# Patient Record
Sex: Female | Born: 1975
Health system: Southern US, Community
[De-identification: ages and names within clinical notes are randomized; demographics above are authoritative.]

## PROBLEM LIST (undated history)

## (undated) DIAGNOSIS — F5000 Anorexia nervosa, unspecified: Secondary | ICD-10-CM

## (undated) DIAGNOSIS — G629 Polyneuropathy, unspecified: Secondary | ICD-10-CM

## (undated) DIAGNOSIS — R3916 Straining to void: Secondary | ICD-10-CM

## (undated) DIAGNOSIS — K219 Gastro-esophageal reflux disease without esophagitis: Secondary | ICD-10-CM

## (undated) DIAGNOSIS — Z87898 Personal history of other specified conditions: Secondary | ICD-10-CM

## (undated) DIAGNOSIS — A0472 Enterocolitis due to Clostridium difficile, not specified as recurrent: Secondary | ICD-10-CM

## (undated) DIAGNOSIS — G8929 Other chronic pain: Secondary | ICD-10-CM

## (undated) DIAGNOSIS — F32A Depression, unspecified: Secondary | ICD-10-CM

## (undated) DIAGNOSIS — R600 Localized edema: Secondary | ICD-10-CM

## (undated) DIAGNOSIS — Z8489 Family history of other specified conditions: Secondary | ICD-10-CM

## (undated) DIAGNOSIS — Z853 Personal history of malignant neoplasm of breast: Secondary | ICD-10-CM

## (undated) DIAGNOSIS — M1990 Unspecified osteoarthritis, unspecified site: Secondary | ICD-10-CM

## (undated) DIAGNOSIS — R351 Nocturia: Secondary | ICD-10-CM

## (undated) DIAGNOSIS — F419 Anxiety disorder, unspecified: Secondary | ICD-10-CM

## (undated) DIAGNOSIS — F3160 Bipolar disorder, current episode mixed, unspecified: Secondary | ICD-10-CM

## (undated) DIAGNOSIS — F1010 Alcohol abuse, uncomplicated: Secondary | ICD-10-CM

## (undated) DIAGNOSIS — Z9221 Personal history of antineoplastic chemotherapy: Secondary | ICD-10-CM

## (undated) DIAGNOSIS — R569 Unspecified convulsions: Secondary | ICD-10-CM

## (undated) DIAGNOSIS — R232 Flushing: Secondary | ICD-10-CM

## (undated) DIAGNOSIS — T451X5A Adverse effect of antineoplastic and immunosuppressive drugs, initial encounter: Secondary | ICD-10-CM

## (undated) DIAGNOSIS — C50919 Malignant neoplasm of unspecified site of unspecified female breast: Secondary | ICD-10-CM

## (undated) DIAGNOSIS — R1031 Right lower quadrant pain: Secondary | ICD-10-CM

## (undated) DIAGNOSIS — I427 Cardiomyopathy due to drug and external agent: Secondary | ICD-10-CM

## (undated) DIAGNOSIS — C801 Malignant (primary) neoplasm, unspecified: Secondary | ICD-10-CM

## (undated) DIAGNOSIS — R350 Frequency of micturition: Secondary | ICD-10-CM

## (undated) DIAGNOSIS — I82629 Acute embolism and thrombosis of deep veins of unspecified upper extremity: Secondary | ICD-10-CM

## (undated) DIAGNOSIS — M87059 Idiopathic aseptic necrosis of unspecified femur: Secondary | ICD-10-CM

## (undated) DIAGNOSIS — R3915 Urgency of urination: Secondary | ICD-10-CM

## (undated) DIAGNOSIS — J189 Pneumonia, unspecified organism: Secondary | ICD-10-CM

## (undated) DIAGNOSIS — Z8614 Personal history of Methicillin resistant Staphylococcus aureus infection: Secondary | ICD-10-CM

## (undated) DIAGNOSIS — N83202 Unspecified ovarian cyst, left side: Secondary | ICD-10-CM

## (undated) DIAGNOSIS — G43909 Migraine, unspecified, not intractable, without status migrainosus: Secondary | ICD-10-CM

## (undated) DIAGNOSIS — F329 Major depressive disorder, single episode, unspecified: Secondary | ICD-10-CM

## (undated) HISTORY — DX: Unspecified convulsions: R56.9

## (undated) HISTORY — DX: Enterocolitis due to Clostridium difficile, not specified as recurrent: A04.72

## (undated) HISTORY — DX: Malignant neoplasm of unspecified site of unspecified female breast: C50.919

## (undated) HISTORY — DX: Anxiety disorder, unspecified: F41.9

## (undated) HISTORY — PX: PORT-A-CATH REMOVAL: SHX5289

## (undated) HISTORY — PX: WISDOM TOOTH EXTRACTION: SHX21

## (undated) HISTORY — DX: Personal history of antineoplastic chemotherapy: Z92.21

## (undated) HISTORY — DX: Major depressive disorder, single episode, unspecified: F32.9

## (undated) HISTORY — DX: Depression, unspecified: F32.A

## (undated) HISTORY — PX: OTHER SURGICAL HISTORY: SHX169

---
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
## 2001-04-20 ENCOUNTER — Other Ambulatory Visit: Admission: RE | Admit: 2001-04-20 | Discharge: 2001-04-20 | Payer: Self-pay | Admitting: Family Medicine

## 2002-03-08 ENCOUNTER — Encounter: Admission: RE | Admit: 2002-03-08 | Discharge: 2002-03-08 | Payer: Self-pay

## 2002-05-30 ENCOUNTER — Other Ambulatory Visit: Admission: RE | Admit: 2002-05-30 | Discharge: 2002-05-30 | Payer: Self-pay | Admitting: Family Medicine

## 2003-02-15 ENCOUNTER — Emergency Department (HOSPITAL_COMMUNITY): Admission: AD | Admit: 2003-02-15 | Discharge: 2003-02-15 | Payer: Self-pay | Admitting: Family Medicine

## 2003-10-07 ENCOUNTER — Emergency Department (HOSPITAL_COMMUNITY): Admission: EM | Admit: 2003-10-07 | Discharge: 2003-10-07 | Payer: Self-pay | Admitting: Emergency Medicine

## 2006-12-19 ENCOUNTER — Emergency Department (HOSPITAL_COMMUNITY): Admission: EM | Admit: 2006-12-19 | Discharge: 2006-12-19 | Payer: Self-pay | Admitting: Emergency Medicine

## 2006-12-28 ENCOUNTER — Encounter: Admission: RE | Admit: 2006-12-28 | Discharge: 2006-12-28 | Payer: Self-pay | Admitting: *Deleted

## 2006-12-29 ENCOUNTER — Emergency Department (HOSPITAL_COMMUNITY): Admission: EM | Admit: 2006-12-29 | Discharge: 2006-12-30 | Payer: Self-pay | Admitting: Emergency Medicine

## 2007-03-01 ENCOUNTER — Emergency Department (HOSPITAL_COMMUNITY): Admission: EM | Admit: 2007-03-01 | Discharge: 2007-03-01 | Payer: Self-pay | Admitting: Emergency Medicine

## 2007-04-23 ENCOUNTER — Ambulatory Visit: Payer: Self-pay | Admitting: Family Medicine

## 2007-04-23 ENCOUNTER — Observation Stay (HOSPITAL_COMMUNITY): Admission: EM | Admit: 2007-04-23 | Discharge: 2007-04-24 | Payer: Self-pay | Admitting: Family Medicine

## 2007-05-02 ENCOUNTER — Ambulatory Visit: Payer: Self-pay | Admitting: Family Medicine

## 2007-05-02 ENCOUNTER — Observation Stay (HOSPITAL_COMMUNITY): Admission: AD | Admit: 2007-05-02 | Discharge: 2007-05-03 | Payer: Self-pay | Admitting: Family Medicine

## 2007-05-07 ENCOUNTER — Emergency Department (HOSPITAL_COMMUNITY): Admission: EM | Admit: 2007-05-07 | Discharge: 2007-05-08 | Payer: Self-pay | Admitting: Emergency Medicine

## 2007-05-16 ENCOUNTER — Ambulatory Visit: Payer: Self-pay | Admitting: Internal Medicine

## 2007-05-17 ENCOUNTER — Ambulatory Visit: Payer: Self-pay | Admitting: Internal Medicine

## 2007-11-24 ENCOUNTER — Emergency Department (HOSPITAL_COMMUNITY): Admission: EM | Admit: 2007-11-24 | Discharge: 2007-11-24 | Payer: Self-pay | Admitting: Emergency Medicine

## 2008-01-08 ENCOUNTER — Emergency Department (HOSPITAL_COMMUNITY): Admission: EM | Admit: 2008-01-08 | Discharge: 2008-01-08 | Payer: Self-pay | Admitting: Emergency Medicine

## 2008-04-09 ENCOUNTER — Emergency Department (HOSPITAL_COMMUNITY): Admission: EM | Admit: 2008-04-09 | Discharge: 2008-04-09 | Payer: Self-pay | Admitting: Emergency Medicine

## 2008-07-12 ENCOUNTER — Emergency Department (HOSPITAL_COMMUNITY): Admission: EM | Admit: 2008-07-12 | Discharge: 2008-07-13 | Payer: Self-pay | Admitting: *Deleted

## 2008-12-02 ENCOUNTER — Emergency Department (HOSPITAL_COMMUNITY): Admission: EM | Admit: 2008-12-02 | Discharge: 2008-12-02 | Payer: Self-pay | Admitting: Emergency Medicine

## 2008-12-13 ENCOUNTER — Emergency Department (HOSPITAL_COMMUNITY): Admission: EM | Admit: 2008-12-13 | Discharge: 2008-12-14 | Payer: Self-pay | Admitting: Emergency Medicine

## 2009-01-19 ENCOUNTER — Emergency Department (HOSPITAL_COMMUNITY): Admission: EM | Admit: 2009-01-19 | Discharge: 2009-01-20 | Payer: Self-pay | Admitting: Emergency Medicine

## 2009-01-28 ENCOUNTER — Ambulatory Visit: Payer: Self-pay | Admitting: Gastroenterology

## 2009-01-28 DIAGNOSIS — R1031 Right lower quadrant pain: Secondary | ICD-10-CM

## 2009-01-28 DIAGNOSIS — R112 Nausea with vomiting, unspecified: Secondary | ICD-10-CM

## 2009-01-29 ENCOUNTER — Ambulatory Visit: Payer: Self-pay | Admitting: Gastroenterology

## 2009-02-05 ENCOUNTER — Ambulatory Visit (HOSPITAL_COMMUNITY): Admission: RE | Admit: 2009-02-05 | Discharge: 2009-02-05 | Payer: Self-pay | Admitting: Gastroenterology

## 2009-02-25 ENCOUNTER — Ambulatory Visit: Payer: Self-pay | Admitting: Obstetrics and Gynecology

## 2009-03-01 ENCOUNTER — Emergency Department (HOSPITAL_COMMUNITY): Admission: EM | Admit: 2009-03-01 | Discharge: 2009-03-01 | Payer: Self-pay | Admitting: Emergency Medicine

## 2009-04-23 ENCOUNTER — Ambulatory Visit: Payer: Self-pay | Admitting: Obstetrics and Gynecology

## 2009-06-08 ENCOUNTER — Emergency Department (HOSPITAL_COMMUNITY): Admission: EM | Admit: 2009-06-08 | Discharge: 2009-06-08 | Payer: Self-pay | Admitting: Emergency Medicine

## 2009-07-20 ENCOUNTER — Ambulatory Visit: Payer: Self-pay | Admitting: Psychiatry

## 2009-07-20 ENCOUNTER — Inpatient Hospital Stay (HOSPITAL_COMMUNITY): Admission: RE | Admit: 2009-07-20 | Discharge: 2009-07-29 | Payer: Self-pay | Admitting: Psychiatry

## 2009-08-21 ENCOUNTER — Emergency Department (HOSPITAL_COMMUNITY): Admission: EM | Admit: 2009-08-21 | Discharge: 2009-08-21 | Payer: Self-pay | Admitting: Emergency Medicine

## 2009-12-05 IMAGING — CT CT PELVIS W/ CM
2 of 4 series · 17 of 46 positions shown, 19 images · IV contrast (APPLIED)
Comparison: 04/09/2008

CT ABDOMEN

CLINICAL DATA: Right-sided abdominal and pelvic pain.  Vomiting.

CT ABDOMEN AND PELVIS WITH CONTRAST
TECHNIQUE: Multidetector CT imaging of the abdomen and pelvis was
performed using the standard protocol following bolus
administration of intravenous contrast.
Contrast: 100 ml Zmnipaque-TAA and oral contrast

[Series 2: abd_pel 5.0 b40f st · axial · 0.73mm/px · z∈[-438,+2]mm · 14 of 96 slices shown, 16 images]
[im 4/96  soft-tissue]
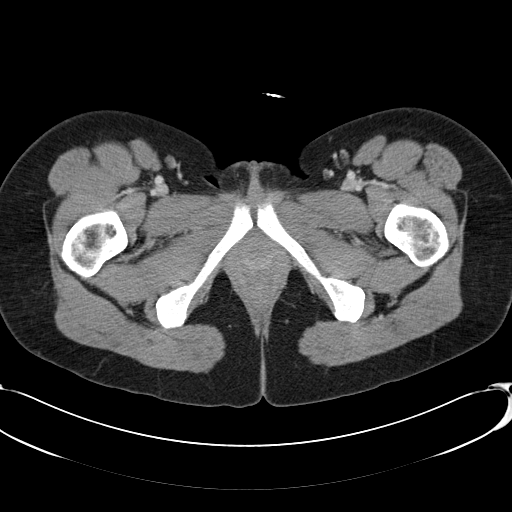
[im 4/96  bone]
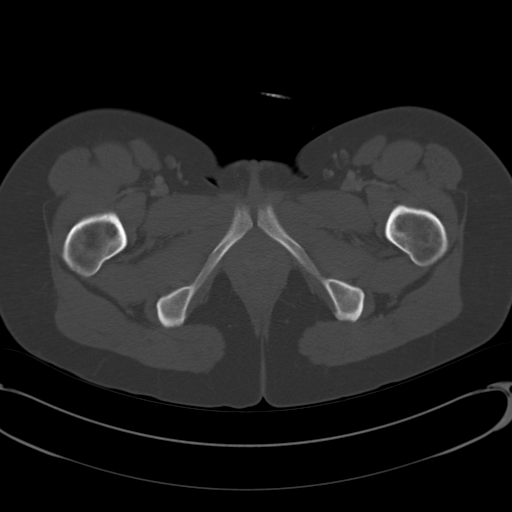
[im 12/96  soft-tissue]
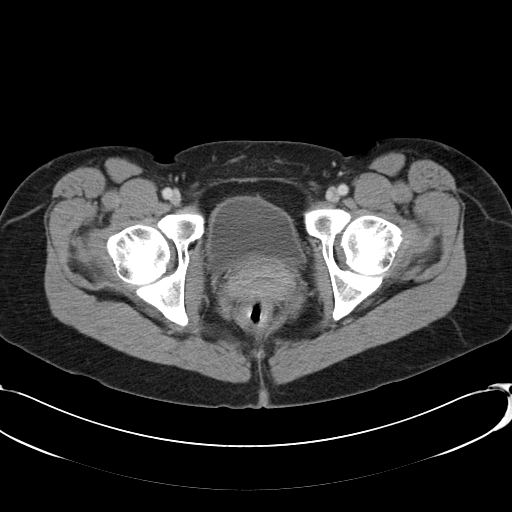
[im 20/96  soft-tissue]
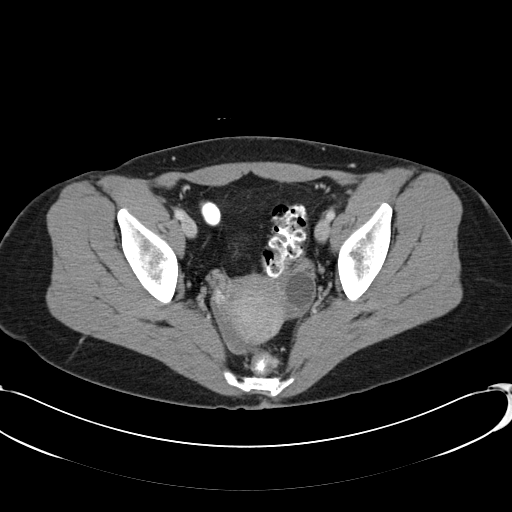
[im 27/96  soft-tissue]
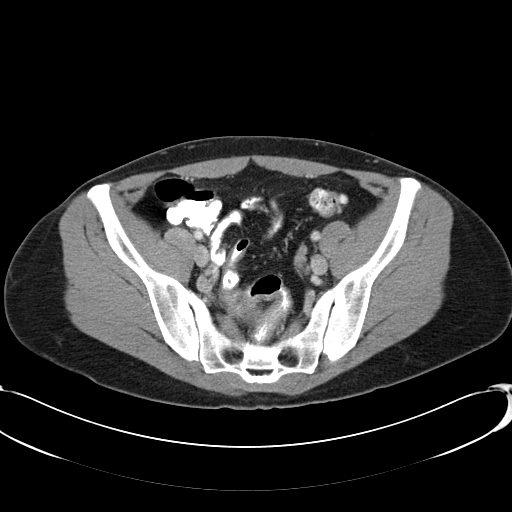
[im 31/96  soft-tissue]
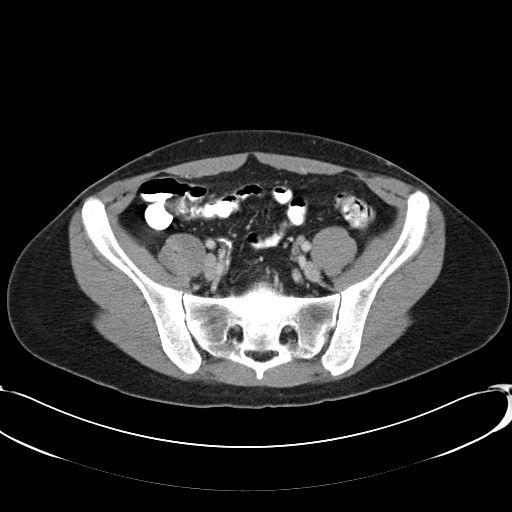
[im 39/96  soft-tissue]
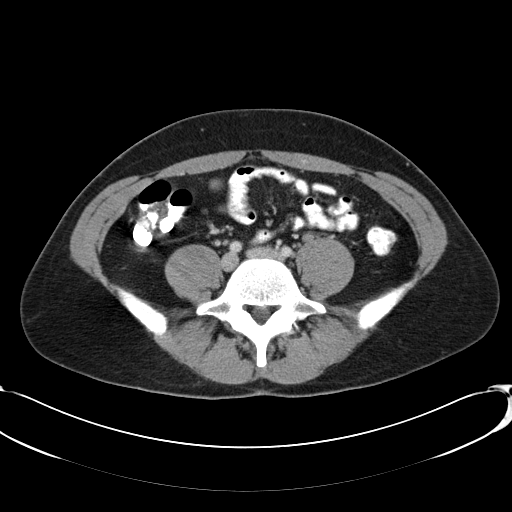
[im 46/96  soft-tissue]
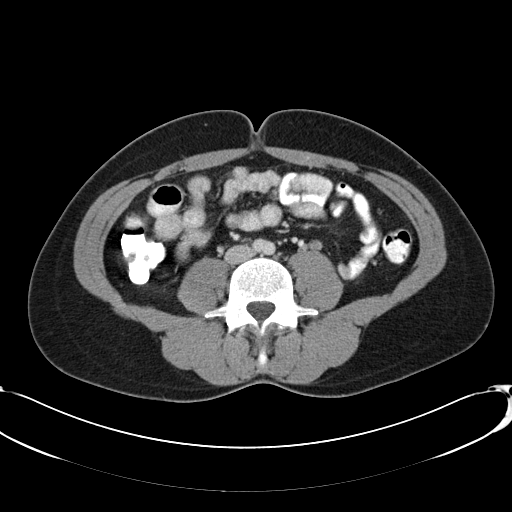
[im 50/96  soft-tissue]
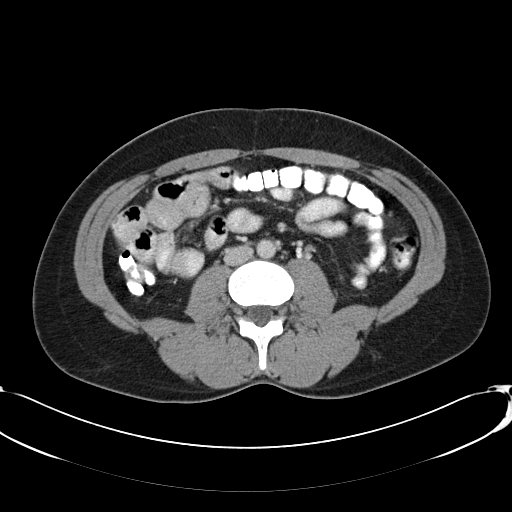
[im 58/96  soft-tissue]
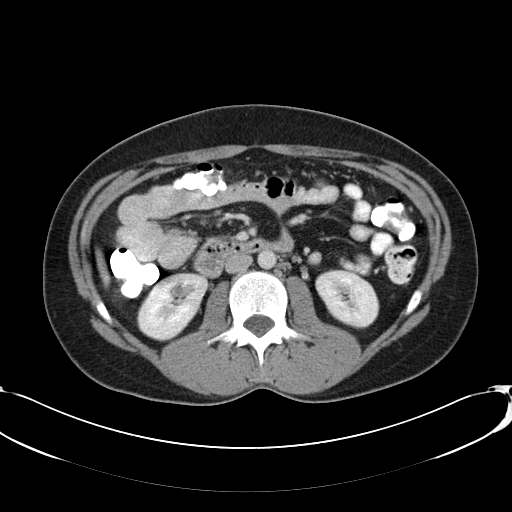
[im 58/96  bone]
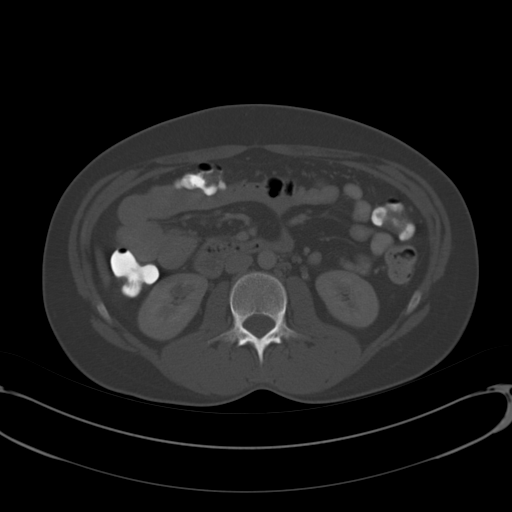
[im 65/96  soft-tissue]
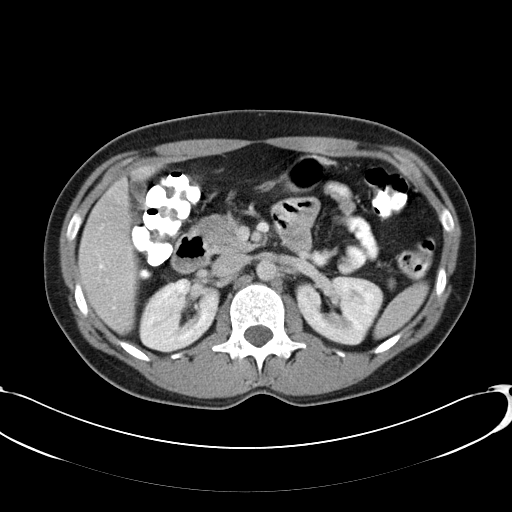
[im 73/96  soft-tissue]
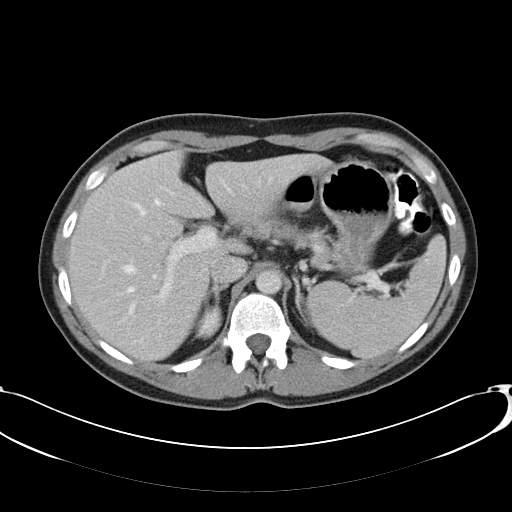
[im 77/96  soft-tissue]
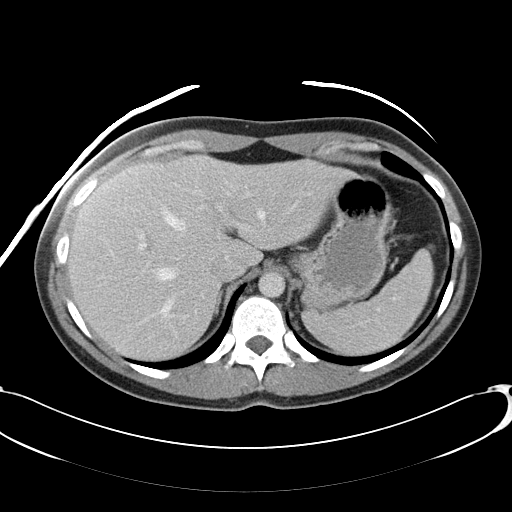
[im 84/96  soft-tissue]
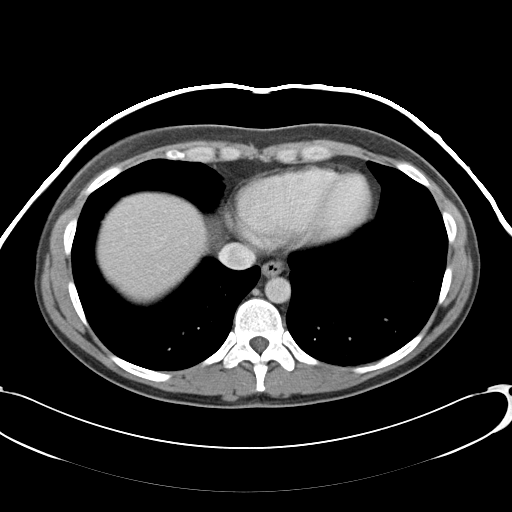
[im 92/96  soft-tissue]
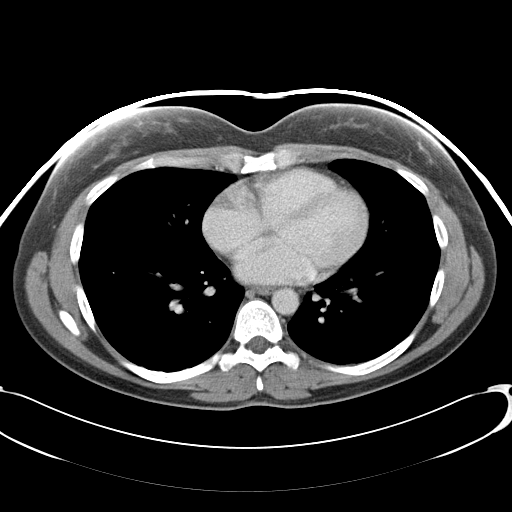

[Series 602: coronal · coronal · 0.97mm/px · 3 of 66 slices shown]
[im 22/66  soft-tissue]
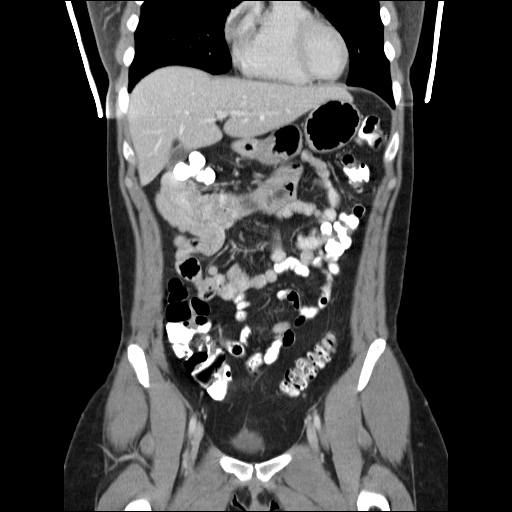
[im 29/66  soft-tissue]
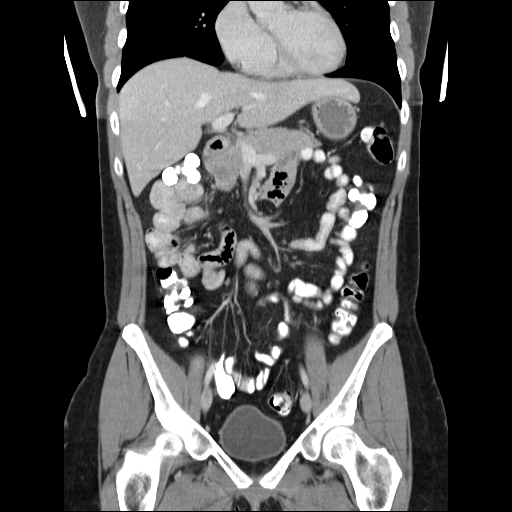
[im 37/66  soft-tissue]
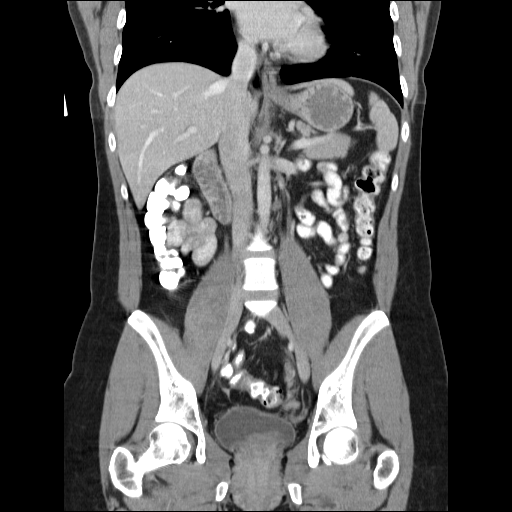

[17 of 46 positions shown; findings below may reference images not displayed]

FINDINGS: Previously noted 4 mm noncalcified nodule in the right
middle lobe remains stable.  The abdominal parenchymal organs are
normal in appearance.  There is no evidence of soft tissue mass or
adenopathy.  There is no evidence of inflammatory process or
abnormal fluid collections.  There is no evidence of bowel wall
thickening or dilatation.
IMPRESSION: No acute findings.

CT PELVIS
FINDINGS: There is no evidence of pelvic soft tissue mass or
lymphadenopathy.

A 2.5 cm left ovarian cyst is seen, which is stable since prior
study, and has benign features.  A corpus luteum cyst is noted in
the right ovary measuring approximately 2 cm and a small amount of
free fluid is seen in the pelvic cul-de-sac which is likely
physiologic.

There is no evidence of inflammatory process or abscess.
IMPRESSION: 1.  2 cm right ovarian corpus luteum, and small amount of free
fluid which is likely physiologic.
2.  Stable 2.5 cm left ovarian simple cyst.

## 2010-04-08 ENCOUNTER — Emergency Department (HOSPITAL_COMMUNITY): Admission: EM | Admit: 2010-04-08 | Discharge: 2009-06-08 | Payer: Self-pay | Admitting: Emergency Medicine

## 2010-06-23 ENCOUNTER — Emergency Department (HOSPITAL_COMMUNITY)
Admission: EM | Admit: 2010-06-23 | Discharge: 2010-06-24 | Disposition: A | Discharge status: Other Acute Inpatient Hospital | Payer: PRIVATE HEALTH INSURANCE | Attending: Emergency Medicine | Admitting: Emergency Medicine

## 2010-06-23 DIAGNOSIS — H5316 Psychophysical visual disturbances: Secondary | ICD-10-CM | POA: Insufficient documentation

## 2010-06-23 DIAGNOSIS — F411 Generalized anxiety disorder: Secondary | ICD-10-CM | POA: Insufficient documentation

## 2010-06-23 DIAGNOSIS — F3289 Other specified depressive episodes: Secondary | ICD-10-CM | POA: Insufficient documentation

## 2010-06-23 DIAGNOSIS — F329 Major depressive disorder, single episode, unspecified: Secondary | ICD-10-CM | POA: Insufficient documentation

## 2010-06-24 ENCOUNTER — Inpatient Hospital Stay (HOSPITAL_COMMUNITY)
Admission: RE | Admit: 2010-06-24 | Discharge: 2010-06-30 | DRG: 885 | Disposition: A | Discharge status: Home / Self Care | Payer: PRIVATE HEALTH INSURANCE | Source: Ambulatory Visit | Attending: Psychiatry | Admitting: Psychiatry

## 2010-06-24 DIAGNOSIS — F39 Unspecified mood [affective] disorder: Principal | ICD-10-CM

## 2010-06-24 DIAGNOSIS — F4310 Post-traumatic stress disorder, unspecified: Secondary | ICD-10-CM

## 2010-06-24 DIAGNOSIS — Z818 Family history of other mental and behavioral disorders: Secondary | ICD-10-CM

## 2010-06-24 DIAGNOSIS — F952 Tourette's disorder: Secondary | ICD-10-CM

## 2010-06-24 DIAGNOSIS — Z881 Allergy status to other antibiotic agents status: Secondary | ICD-10-CM

## 2010-06-24 DIAGNOSIS — G40909 Epilepsy, unspecified, not intractable, without status epilepticus: Secondary | ICD-10-CM

## 2010-06-24 DIAGNOSIS — Z6379 Other stressful life events affecting family and household: Secondary | ICD-10-CM

## 2010-06-24 DIAGNOSIS — G43909 Migraine, unspecified, not intractable, without status migrainosus: Secondary | ICD-10-CM

## 2010-06-24 DIAGNOSIS — F1911 Other psychoactive substance abuse, in remission: Secondary | ICD-10-CM

## 2010-06-24 DIAGNOSIS — R45851 Suicidal ideations: Secondary | ICD-10-CM

## 2010-06-24 DIAGNOSIS — Z880 Allergy status to penicillin: Secondary | ICD-10-CM

## 2010-06-24 LAB — COMPREHENSIVE METABOLIC PANEL
ALT: 20 U/L (ref 0–35)
AST: 18 U/L (ref 0–37)
Albumin: 4.2 g/dL (ref 3.5–5.2)
CO2: 26 mEq/L (ref 19–32)
Calcium: 9.3 mg/dL (ref 8.4–10.5)
Creatinine, Ser: 1 mg/dL (ref 0.4–1.2)
GFR calc Af Amer: >60 mL/min (ref >60)
GFR calc non Af Amer: >60 mL/min (ref >60)
Sodium: 140 mEq/L (ref 135–145)
Total Protein: 7.1 g/dL (ref 6.0–8.3)

## 2010-06-24 LAB — URINALYSIS, ROUTINE W REFLEX MICROSCOPIC
Bilirubin Urine: NEGATIVE
Ketones, ur: NEGATIVE mg/dL
Nitrite: NEGATIVE
pH: 5.5 (ref 5.0–8.0)

## 2010-06-24 LAB — CBC
HCT: 44.5 % (ref 36.0–46.0)
Hemoglobin: 15.6 g/dL — ABNORMAL HIGH (ref 12.0–15.0)
RDW: 11.9 % (ref 11.5–15.5)
WBC: 11.6 10*3/uL — ABNORMAL HIGH (ref 4.0–10.5)

## 2010-06-24 LAB — DIFFERENTIAL
Basophils Absolute: 0 10*3/uL (ref 0.0–0.1)
Basophils Relative: 0 % (ref 0–1)
Lymphocytes Relative: 22 % (ref 12–46)
Neutro Abs: 8.4 10*3/uL — ABNORMAL HIGH (ref 1.7–7.7)

## 2010-06-24 LAB — RAPID URINE DRUG SCREEN, HOSP PERFORMED
Amphetamines: NOT DETECTED
Barbiturates: NOT DETECTED
Cocaine: NOT DETECTED
Opiates: NOT DETECTED
Tetrahydrocannabinol: NOT DETECTED

## 2010-06-24 LAB — URINE MICROSCOPIC-ADD ON

## 2010-06-24 LAB — POCT PREGNANCY, URINE: Preg Test, Ur: NEGATIVE

## 2010-06-24 LAB — ETHANOL: Alcohol, Ethyl (B): 23 mg/dL — ABNORMAL HIGH (ref 0–10)
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
## 2010-07-21 LAB — COMPREHENSIVE METABOLIC PANEL
ALT: 19 U/L (ref 0–35)
Alkaline Phosphatase: 75 U/L (ref 39–117)
BUN: 11 mg/dL (ref 6–23)
BUN: 8 mg/dL (ref 6–23)
CO2: 25 mEq/L (ref 19–32)
CO2: 27 mEq/L (ref 19–32)
Calcium: 9.1 mg/dL (ref 8.4–10.5)
Calcium: 9.4 mg/dL (ref 8.4–10.5)
GFR calc non Af Amer: 54 mL/min — ABNORMAL LOW (ref >60)
GFR calc non Af Amer: >60 mL/min (ref >60)
Glucose, Bld: 101 mg/dL — ABNORMAL HIGH (ref 70–99)
Glucose, Bld: 82 mg/dL (ref 70–99)
Potassium: 3.5 mEq/L (ref 3.5–5.1)
Sodium: 138 mEq/L (ref 135–145)
Total Protein: 6.9 g/dL (ref 6.0–8.3)
Total Protein: 7.6 g/dL (ref 6.0–8.3)

## 2010-07-21 LAB — CBC
HCT: 41.8 % (ref 36.0–46.0)
HCT: 47.3 % — ABNORMAL HIGH (ref 36.0–46.0)
Hemoglobin: 14.9 g/dL (ref 12.0–15.0)
Hemoglobin: 16.6 g/dL — ABNORMAL HIGH (ref 12.0–15.0)
MCHC: 35.2 g/dL (ref 30.0–36.0)
MCHC: 35.7 g/dL (ref 30.0–36.0)
MCV: 95.8 fL (ref 78.0–100.0)
RBC: 4.37 MIL/uL (ref 3.87–5.11)
RDW: 12.8 % (ref 11.5–15.5)
RDW: 13.3 % (ref 11.5–15.5)

## 2010-07-21 LAB — DIFFERENTIAL
Basophils Absolute: 0.1 10*3/uL (ref 0.0–0.1)
Basophils Relative: 1 % (ref 0–1)
Basophils Relative: 1 % (ref 0–1)
Eosinophils Absolute: 0.4 10*3/uL (ref 0.0–0.7)
Lymphs Abs: 3 10*3/uL (ref 0.7–4.0)
Monocytes Relative: 4 % (ref 3–12)
Monocytes Relative: 7 % (ref 3–12)
Neutro Abs: 11.6 10*3/uL — ABNORMAL HIGH (ref 1.7–7.7)
Neutro Abs: 7.9 10*3/uL — ABNORMAL HIGH (ref 1.7–7.7)
Neutrophils Relative %: 65 % (ref 43–77)
Neutrophils Relative %: 77 % (ref 43–77)

## 2010-07-21 LAB — RAPID URINE DRUG SCREEN, HOSP PERFORMED
Amphetamines: NOT DETECTED
Amphetamines: NOT DETECTED
Barbiturates: NOT DETECTED
Benzodiazepines: POSITIVE — AB
Opiates: NOT DETECTED
Tetrahydrocannabinol: NOT DETECTED

## 2010-07-21 LAB — PREGNANCY, URINE: Preg Test, Ur: NEGATIVE

## 2010-07-21 LAB — URINALYSIS, ROUTINE W REFLEX MICROSCOPIC
Leukocytes, UA: NEGATIVE
Protein, ur: NEGATIVE mg/dL
Urobilinogen, UA: 0.2 mg/dL (ref 0.0–1.0)

## 2010-07-21 LAB — URINE MICROSCOPIC-ADD ON

## 2010-07-26 LAB — COMPREHENSIVE METABOLIC PANEL
ALT: 18 U/L (ref 0–35)
AST: 21 U/L (ref 0–37)
Alkaline Phosphatase: 83 U/L (ref 39–117)
CO2: 28 mEq/L (ref 19–32)
Chloride: 101 mEq/L (ref 96–112)
GFR calc non Af Amer: 52 mL/min — ABNORMAL LOW (ref >60)
Glucose, Bld: 94 mg/dL (ref 70–99)
Potassium: 3.8 mEq/L (ref 3.5–5.1)
Sodium: 137 mEq/L (ref 135–145)
Total Bilirubin: 1.1 mg/dL (ref 0.3–1.2)

## 2010-07-26 LAB — TSH: TSH: 4.066 u[IU]/mL (ref 0.350–4.500)

## 2010-07-26 LAB — CBC
Hemoglobin: 16.8 g/dL — ABNORMAL HIGH (ref 12.0–15.0)
MCHC: 34.2 g/dL (ref 30.0–36.0)
RBC: 5.05 MIL/uL (ref 3.87–5.11)
WBC: 7.3 10*3/uL (ref 4.0–10.5)

## 2010-08-05 LAB — CBC
MCHC: 36.1 g/dL — ABNORMAL HIGH (ref 30.0–36.0)
MCV: 94.9 fL (ref 78.0–100.0)
RDW: 12.3 % (ref 11.5–15.5)

## 2010-08-05 LAB — PROTIME-INR
INR: 1.57 — ABNORMAL HIGH (ref 0.00–1.49)
Prothrombin Time: 18.6 seconds — ABNORMAL HIGH (ref 11.6–15.2)

## 2010-08-06 LAB — COMPREHENSIVE METABOLIC PANEL
ALT: 29 U/L (ref 0–35)
Albumin: 4.8 g/dL (ref 3.5–5.2)
Alkaline Phosphatase: 71 U/L (ref 39–117)
BUN: 10 mg/dL (ref 6–23)
Chloride: 102 mEq/L (ref 96–112)
Potassium: 3.3 mEq/L — ABNORMAL LOW (ref 3.5–5.1)
Sodium: 139 mEq/L (ref 135–145)
Total Bilirubin: 0.7 mg/dL (ref 0.3–1.2)
Total Protein: 8 g/dL (ref 6.0–8.3)

## 2010-08-06 LAB — CBC
HCT: 48.1 % — ABNORMAL HIGH (ref 36.0–46.0)
Hemoglobin: 16.6 g/dL — ABNORMAL HIGH (ref 12.0–15.0)
Platelets: 240 10*3/uL (ref 150–400)
RDW: 12.9 % (ref 11.5–15.5)
WBC: 9.6 10*3/uL (ref 4.0–10.5)

## 2010-08-06 LAB — URINALYSIS, ROUTINE W REFLEX MICROSCOPIC
Glucose, UA: NEGATIVE mg/dL
Ketones, ur: NEGATIVE mg/dL
Nitrite: NEGATIVE
Protein, ur: NEGATIVE mg/dL
pH: 6 (ref 5.0–8.0)

## 2010-08-06 LAB — DIFFERENTIAL
Basophils Absolute: 0 10*3/uL (ref 0.0–0.1)
Basophils Relative: 0 % (ref 0–1)
Eosinophils Absolute: 0.3 10*3/uL (ref 0.0–0.7)
Eosinophils Relative: 3 % (ref 0–5)
Monocytes Absolute: 0.4 10*3/uL (ref 0.1–1.0)
Monocytes Relative: 4 % (ref 3–12)

## 2010-08-06 LAB — PREGNANCY, URINE: Preg Test, Ur: NEGATIVE

## 2010-08-07 LAB — POCT I-STAT, CHEM 8
Hemoglobin: 14.6 g/dL (ref 12.0–15.0)
Sodium: 136 mEq/L (ref 135–145)
TCO2: 22 mmol/L (ref 0–100)

## 2010-08-07 LAB — CBC
HCT: 47.9 % — ABNORMAL HIGH (ref 36.0–46.0)
Hemoglobin: 16.5 g/dL — ABNORMAL HIGH (ref 12.0–15.0)
RDW: 13 % (ref 11.5–15.5)

## 2010-08-07 LAB — COMPREHENSIVE METABOLIC PANEL
Alkaline Phosphatase: 66 U/L (ref 39–117)
BUN: 10 mg/dL (ref 6–23)
CO2: 29 mEq/L (ref 19–32)
GFR calc non Af Amer: >60 mL/min (ref >60)
Glucose, Bld: 88 mg/dL (ref 70–99)
Potassium: 3.5 mEq/L (ref 3.5–5.1)
Total Bilirubin: 0.6 mg/dL (ref 0.3–1.2)
Total Protein: 7.6 g/dL (ref 6.0–8.3)

## 2010-08-07 LAB — DIFFERENTIAL
Basophils Absolute: 0 10*3/uL (ref 0.0–0.1)
Basophils Relative: 0 % (ref 0–1)
Neutro Abs: 6.1 10*3/uL (ref 1.7–7.7)
Neutrophils Relative %: 59 % (ref 43–77)

## 2010-08-07 LAB — URINALYSIS, ROUTINE W REFLEX MICROSCOPIC
Glucose, UA: NEGATIVE mg/dL
Ketones, ur: NEGATIVE mg/dL
Leukocytes, UA: NEGATIVE
Nitrite: NEGATIVE
Nitrite: NEGATIVE
Protein, ur: NEGATIVE mg/dL
Specific Gravity, Urine: 1.003 — ABNORMAL LOW (ref 1.005–1.030)
Urobilinogen, UA: 0.2 mg/dL (ref 0.0–1.0)

## 2010-08-07 LAB — PREGNANCY, URINE: Preg Test, Ur: NEGATIVE

## 2010-08-07 LAB — POCT PREGNANCY, URINE: Preg Test, Ur: NEGATIVE

## 2010-08-07 LAB — URINE MICROSCOPIC-ADD ON

## 2010-08-07 LAB — LIPASE, BLOOD: Lipase: 28 U/L (ref 11–59)

## 2010-08-12 LAB — DIFFERENTIAL
Basophils Absolute: 0.2 10*3/uL — ABNORMAL HIGH (ref 0.0–0.1)
Basophils Relative: 2 % — ABNORMAL HIGH (ref 0–1)
Eosinophils Relative: 5 % (ref 0–5)
Lymphocytes Relative: 30 % (ref 12–46)
Monocytes Absolute: 0.4 10*3/uL (ref 0.1–1.0)
Monocytes Relative: 4 % (ref 3–12)

## 2010-08-12 LAB — BASIC METABOLIC PANEL
CO2: 25 mEq/L (ref 19–32)
GFR calc Af Amer: >60 mL/min (ref >60)
Glucose, Bld: 101 mg/dL — ABNORMAL HIGH (ref 70–99)
Potassium: 3.8 mEq/L (ref 3.5–5.1)
Sodium: 144 mEq/L (ref 135–145)

## 2010-08-12 LAB — ETHANOL: Alcohol, Ethyl (B): 155 mg/dL — ABNORMAL HIGH (ref 0–10)

## 2010-08-12 LAB — CBC
HCT: 47.5 % — ABNORMAL HIGH (ref 36.0–46.0)
Hemoglobin: 16.6 g/dL — ABNORMAL HIGH (ref 12.0–15.0)
MCHC: 34.8 g/dL (ref 30.0–36.0)
RBC: 5.03 MIL/uL (ref 3.87–5.11)
RDW: 12.7 % (ref 11.5–15.5)

## 2010-08-12 LAB — RAPID URINE DRUG SCREEN, HOSP PERFORMED
Barbiturates: NOT DETECTED
Opiates: NOT DETECTED
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
## 2010-09-14 NOTE — Discharge Summary (Signed)
NAME:  Crystal [name], Crystal [name]               ACCOUNT NO.:  [id number]   MEDICAL RECORD NO.:  [id number]          PATIENT TYPE:  INP   LOCATION:  5022                         FACILITY:  [facility]   PHYSICIAN:  Pearlean Brownie, M.D.DATE OF BIRTH:  [birth year]   DATE OF ADMISSION:  05/02/2007  DATE OF DISCHARGE:  05/03/2007                               DISCHARGE SUMMARY   DISCHARGE DIAGNOSES:  Nausea and vomiting, resolved.   OTHER DIAGNOSES:  1. Chronic nausea and vomiting.  2. Seizure disorder diagnosed in August 2008.  3. History of anxiety.  4. History of panic attack.  5. History of visual hallucinations.  6. Migraines.  7. Tinnitus.   CONSULTATIONS:  None.   PROCEDURE:  None.   ADMISSION LABS:  CBC with differential on admission showed white blood  cell count of 7.8, hemoglobin of 13.8, platelet count of 137, with 89%  neutrophils.  Complete metabolic panel on admission showed a sodium 134,  potassium 3.9, chloride 103, CO2 of 28, glucose 84, BUN 9, and  creatinine 0.71.  Liver function tests were within normal limits except  for a slightly low protein at 5.8.  Dilantin level performed on  admission was 10.2 and lipase was 16.  Urinalysis on admission showed a  specific gravity of 1.01 and was completely negative.  Urine drug screen  was positive for benzodiazepines, otherwise negative.   DISCHARGE LAB WORK:  On the date of discharge, the patient CBC showed a  white blood cell count of 5.2, hemoglobin of 12.6, and platelet count of  134,000.  Basic metabolic panel was within normal limits.  Urine  pregnancy test was negative.  Urine culture was done.  Urinalysis on  admission showed significant growth.   STUDIES:  Two-view abdomen showed no acute findings with a moderate  stool burden.   HOSPITAL COURSE:  The patient is a 35 year old white female with a  history of chronic nausea, vomiting, seizure disorder, anxiety, and  panic attacks who presented with intractable nausea and  vomiting from  [facility] via Dr. Cleta Alberts.  Of note, the patient was recently  admitted on April 23, 2007, similar symptomatology and had an  uncomplicated hospital course with discharge the next day.  The  patient's hospital course was similar to her last presentation to the  hospital.  Per report from the patient's family, the patient had at  least 5 episodes of vomiting since her discharge on April 24, 2007.  However, the patient had got married on April 26, 2007, after feeling  well enough to do so.  The patient continued to have episodes of nausea  and vomiting per report and on the day of admission to the hospital,  which was in the evening, the patient's husband reported that the  patient vomited at least 40 times the same day.  Of note, the patient  was also reported by her husband to have intentionally purged because of  feeling something struck in her throat.  Upon admission to the  hospital, the patient had recently received a dose of Vistaril at [facility] and on admission  to the hospital, here at [facility],  received 4 mg of Zofran IV.  She was started on IV fluids and made NPO.  Upon admission to the hospital, the patient had no further episodes of  vomiting and her nausea resolved spontaneously after receiving only 1  dose of Zofran.  The patient's lab work was completely negative and in  fact showed no signs of chronic vomiting, or even recent vomiting with a  normal specific gravity on urinalysis and normal metabolic panel showing  normal BUN and normal creatinine.  The patient's white blood cell count  was within normal limits and the patient remained afebrile throughout  her hospitalization.  During her hospitalization, the patient's husband  voiced concerned for having a further work up while in the hospital  including an endoscopy.  However, as laboratory work up indicated no  need for an endoscopy, this was not pursued and also given holiday,  it  was felt that this would delay the patient's discharge from the hospital  unnecessarily as this could be elective as an outpatient.  Consideration  was also given to psychiatric consultation with this admission given the  patient's recent history of admission for chronic nausea and vomiting  with no clear etiology and unremarkable lab work.  Please note that  there is some concern with the patient's social situation as her husband  at times seems to be almost over bearing and repeated outpatient workups  for her seizure disorder and her nausea and vomiting have returned  negative.  There is some concern for possible conversion disorder given  the patient's diagnosis of seizure disorder and normal EEG's.  There is  some concern for possible cyclic vomiting syndrome.  There is also some  concern for possible malingering and secondary gain on behalf of the  patient's husband.  On May 03, 2007, the patient was continuing to  have no further episodes of nausea and vomiting with minimal  intervention at the hospital with only IV fluids and 1 dose of Zofran  upon initial presentation.  At the time of discharge, the patient was  tolerating oral intake without difficulty.  She had had no seizure  activity and was currently free of nausea and vomiting.  Given the  psychiatric consultation will be difficult to achieve while in the  hospital and that this would unnecessarily delay the patient's discharge  from the hospital and that the patient wished a desire to be discharged  to home.  It was felt that the patient was fit for discharge with  recommendations to follow up closely with Dr. Salomon Mast, at [facility] within 1 to 2 weeks.   DISCHARGE MEDICATIONS:  1. Lamictal 75 mg p.o. 7:30 in the morning and 150 mg p.o. at 6 p.m.      at night.  2. Dilantin 300 mg p.o. 9 p.m. at night.  3. Ativan p.r.n. anxiety.  4. Xanax p.r.n. panic attacks.  5. Zofran 4 mg p.o. p.r.n.  6. Vistaril 50  mg p.o. q.6 h. p.r.n. nausea.  7. MiraLax 17 g and 8 ounces of water once daily, titrate to twice      daily when constipated.  8. Citalopram 20 mg p.o. once daily.  This is the new medication which      the patient had not yet started, but had received a prescription      for.   DISCHARGE INSTRUCTIONS:  1. The patient is to take medications as mentioned previously.  2.  The patient is to follow up with Dr. Salomon Mast, at [facility]      in 1 to 2 weeks.  3. The patient is to keep her next scheduled appointment with her      neurologist, Dr. Nash Shearer.  4. The patient is advised to seek out Gastroenterology followup as an      outpatient for possible further workup of her nausea and vomiting.  5. The patient is to maintain consistent daily bowel regimen as her      chronic constipation is possibly contributing to her      symptomatology.   Issues pending at the time of discharge, issues for followup.  Consideration should be given for this patient to be seen by behavioral  health for these symptoms as her workup has returned negative so far and  it is thought that this might be more of a functional rather than  organic disorder.  Also, the patient mentioned on this admission that  she is chronically constipated and has bowel movements sometimes more  infrequently than once a week.  Accordingly, we will discharge the  patient on a bowel regimen and recommend that she maintain this.  Further consideration should be given to this part of the patient's  symptomatology as it may contribute to her chronic nausea and vomiting.   DISCHARGE CONDITION:  Stable, good.   DISPOSITION:  The patient is discharged to home with recommendations to  follow up closely with Dr. Salomon Mast, at [facility].      Myrtie Soman, MD  Electronically Signed      Pearlean Brownie, M.D.  Electronically Signed    TE/MEDQ  D:  05/04/2007  T:  05/05/2007  Job:  [id number]   cc:   Bevelyn Buckles.  Nash Shearer, M.D.  Darrol Jump, MD

## 2010-09-14 NOTE — Letter (Signed)
May 16, 2007    Brett Canales A. Cleta Alberts, M.D.  [address]  [place],  [place] [address]   RE:  Crystal, [name]  MRN:  [id number]  /  DOB:  [birth year]   Dear Brett Canales:   It was a pleasure to see Crystal [name] today for recurrent syncope and  seizures.  She came in with her husband.   It is a very difficult situation and a very difficult story. It is  offered primarily by her husband.  She speaks little and when she does  it is not infrequently interrupted.   As best as I could tell, the patient has had recurrent seizures over the  last 6 months.  These have been associated with a prodrome of nausea and  vomiting.  She gets very pale with these, described as white as a  Styrofoam cup and is associated with significant postictal memory loss.  She has seen Dr. Nash Shearer and his evaluation and what they understood him  to tell her was that her seizures may not be neurological at this point  because they are not being controlled by all the medications that he has  put her on.   Apparently, she somewhere along the line has been identified as been  orthostatic as well.  She certainly has orthostatic lightheadedness.  She has recurrent episodes of cyclical vomiting, vomiting 40 times an  hour or so and when she goes to the hospital she is barely able to  stand.   There is also significant depression/anxiety component that has both  worsened with this as well as antedates this.  She has a history of  headaches and migraines.  She describes pass of suicidality.  Her  husband also describes comments made by her father as you know if the  weak were dead you would be dead, etc...   CURRENT MEDICATIONS:  1. Lamictal.  2. Dilantin.  3. Prilosec.  4. Celexa.  5. Zyprexa.   She has no known allergies.   SOCIAL HISTORY:  She used to work with a International aid/development worker.  She is now  disabled. Her husband is not working because he is caring for her full  time.  She has a child.   PHYSICAL EXAMINATION:  Her  affect is extremely flat.  She is quiet and  acts somewhat confused.  Her blood pressure was 112/80 with a pulse of  96 lying; it was 109/77 with a pulse of 101 sitting; standing at zero  minutes it was 112/70 with a pulse of 107; with swaying she became  nauseated with a heart rate 113, blood pressure 116/86 and her pulse was  115 and blood pressure 113/84 at 5 minutes.  HEENT exam demonstrated no icterus or xanthoma.  The neck veins were  flat.  The carotids are brisk and full or fruits.  BACK:  Without kyphosis, scoliosis.  Lungs were clear.  HEART:  Sounds were regular without murmurs or gallops.  The abdomen was soft with active bowel sounds without midline pulsation  or hepatomegaly.  Femoral pulses were 2+, distal pulses were intact.  There is no  clubbing, cyanosis or edema.  Neurological exam was grossly normal.  Her skin was warm and dry.   Electrocardiogram demonstrated sinus rhythm at 96 with intervals of  0.19/0.10/0.40 with a QTC of about 500 milliseconds.  I should note that  blood work drawn at the hospital on January 6 demonstrated normal  potassium.   IMPRESSION:  1. Recurrent syncope and seizures.  2.  Abnormal QT prolongation.  3. History of anxiety and depression.   Mrs. Cherice, Glennie, has a complex problem that I am not going to begin to  be able to unravel. I think three issues that I might be able to help  with are: 1.) That I have encouraged her to follow with [facility] as the anxiety, depression component of this is going to  make whatever it is worse; 2.)  The second question is, could these  episodes be cardiac seizures related to QT prolongation and polymorphic  ventricular tachycardia.  I suspect the answer that is no given the  description of the prolonged nature of the events as well as her  antecedent history of typical neurally mediated syncope.  However, her  QT interval on her electrocardiogram begs an answer and I also need  to  look at her medications to see whether any of them are associated with  QT prolongation, although none of them come to mind.  3.)  The third  issue is whether these are neurally mediated episodes.  An event  recorder during an episode might be helpful in that would both eliminate  polymorphic VT as well as if it demonstrated bradycardia might be  helpful in trying to suggest a neurocardiogenic component, although I  would not be surprised of seizures could cause heart rate slowing as  well.  Tilt table testing is something also that could be done, although  would need to be done with intra-arterial monitoring given the abrupt  December spells, we would need to be able to know what the blood  pressure was doing on a beat to beat basis.   I have reviewed this with her and her husband.  After I did this, there  were a variety issues it came up about how it was that they were going  to pass information on to her father and I gave them the website for  NDRF.org and suggested neurocardiogenic syncope as well as a looking at  long QT. I should also note Brett Canales that her vital signs today are  consistent with postural orthostatic tachycardia and that as a  dysautonomic thing may point towards an underlying diagnosis, but again,  I think it is very hard for me given the patient's affect, interactions  and such to know that whether this is neuropsychiatric or  neurocardiogenic at this point.   Thank you much for allowing me to participate in her care and I will  forward the information to you.    Sincerely,      Duke Salvia, MD, [facility]  Electronically Signed    SCK/MedQ  DD: 05/16/2007  DT: 05/16/2007  Job #: [id number]   CC:    Estanislado Pandy, MD

## 2010-09-14 NOTE — Discharge Summary (Signed)
NAME:  Crystal [name], Crystal [name]               ACCOUNT NO.:  [id number]   MEDICAL RECORD NO.:  [id number]          PATIENT TYPE:  INP   LOCATION:  5522                         FACILITY:  [facility]   PHYSICIAN:  Nestor Ramp, MD        DATE OF BIRTH:  06/07/1975   DATE OF ADMISSION:  04/23/2007  DATE OF DISCHARGE:  04/24/2007                               DISCHARGE SUMMARY   PRIMARY CARE PHYSICIAN:  Brett Canales A. Cleta Alberts, M.D., [facility].   DISCHARGE DIAGNOSES:  1. Nausea and vomiting.  2. Dehydration.  3. Seizure disorder.  4. Anxiety.  5. Migraines.  6. Depression.   DISCHARGE MEDICATIONS:  1. Lamictal 150 mg p.o. b.i.d.  2. Dilantin 200 mg p.o. nightly.  3. Ativan 0.5 mg p.o. b.i.d. as needed for anxiety.  4. Protonix 40 mg p.o. every morning.  5. Zofran 4 mg p.o. q.4 h. as needed for nausea and vomiting.   CONSULTATIONS:  None.   PROCEDURES:  None.   LABORATORIES:  On admission, the patient's sodium was 137, potassium  3.9, CO2 28, creatinine 0.82, glucose 110.  AST 19, ALT 24, alkaline  phosphatase 64, total bilirubin 0.8, lipase 16, albumin 3.6, calcium  8.5.  Dilantin level was 3.8.  White blood cell count 4.4, hemoglobin  13.1, platelets 186.   BRIEF HOSPITAL COURSE:  This is a 35 year old white female who was  admitted for chronic nausea and vomiting.  1. Nausea and vomiting.  The etiology is unclear at this point.  It      seems that her symptoms began at the same time that she was started      on her antiseizure medications.  Therefore, this may be related to      a reaction to her medications, possible side effect.  She is seen      by Dr. Nash Shearer for her seizures, and we will defer to him for these      symptoms.  It does not seem like it was infection given her white      blood cell count of 4.4.  She was afebrile.  She had no diarrhea,      no abdominal pain associated with her symptoms.  For her nausea and      vomiting, we placed an IV, repleted her volume loss with  normal      saline and placed her on maintenance fluids of D5 half normal      saline at 125 throughout her hospital course.  She was started on      Zofran 4 mg and was free of emesis throughout her hospital stay.      We did obtain a urine pregnancy test, a urinalysis and urine drug      screen that are all still pending at this point.  2. Dehydration.  She did seem to have sunken eyes and mild dehydration      although her BUN was within normal limits.  She was repleted with      normal saline IV fluids and placed on maintenance D5 half normal  saline.  She was able to increase her p.o. intake upon discharge      with no further emesis episodes.  3. Seizure disorder.  She is being treated by Dr. Nash Shearer with      Dilantin and Lamictal.  She was continued on those medications.      Dilantin level was 3.8 and seems to have been subtherapeutic given      the number of seizures she has had this month.  She is to follow up      with Dr. Nash Shearer on Friday at her scheduled appointment for further      management.  4. Anxiety.  This seemed to be stable throughout her hospital course.      She was started on Ativan at a low dose of 0.5 mg q.8 h. as needed      and will be discharged on 0.5 mg b.i.d. as needed for anxiety.  5. Migraines.  She did complain of some headaches during her hospital      stay.  She was given Tylenol, and her migraines seemed to resolve.  6. Depression.  She was initially on Lexapro.  Since she did not      tolerate the medication, she has not been taking it.  We did not      restart any medication during this time, and her depression seemed      to be stable throughout her hospital course.   DISCHARGE INSTRUCTIONS:  She can return to work as soon as possible.  There are no restrictions to her activity.  She needs to advance her  diet as tolerated.  She is to call her primary doctor or return to the  ED if her nausea and vomiting do not improve with her home  medications  or if she is not tolerating oral liquids for greater than 24 hours.   DISCHARGE CONDITION:  Stable.   FOLLOWUP:  She is going to follow up with Dr. Nash Shearer on Friday.  She  needs to keep her previously scheduled appointment.  She also needs to  call to set up a followup appointment with Dr. Cleta Alberts at [place] (phone  number [phone]) within the next 3 weeks.  Of note, I did attempt to  call but was not able to reach the office.      Marisue Ivan, MD  Electronically Signed      Nestor Ramp, MD  Electronically Signed    KL/MEDQ  D:  04/24/2007  T:  04/24/2007  Job:  [id number]   cc:   Estanislado Pandy, MD  [place] Cleta Alberts, M.D.

## 2010-09-14 NOTE — Assessment & Plan Note (Signed)
[facility]                                 ON-CALL NOTE   NAME:Pietrzyk, JEYLI ZWICKER                      MRN:          [id number]  DATE:01/30/2009                            DOB:          1975/11/18    Ms. Rens called to state that she has had some tingling in her hands  and felt like she was developing Tourette-type symptoms after taking her  omeprazole.  She took Cymbalta at the same time.  Symptoms eventually  subsided.  She raised the concern that this was a medication effect.  I  explained to her that I am unfamiliar with a reaction like this with  omeprazole combining with Cymbalta and I did state that she can try the  medicine again tomorrow.  If her symptoms should recur, then she will  contact me, and I will discontinue the medicine.     Barbette Hair. Arlyce Dice, MD,FACG  Electronically Signed    RDK/MedQ  DD: 01/30/2009  DT: 01/31/2009  Job #: [id number]   cc:   Venita Lick. Russella Dar, MD, Clementeen Graham

## 2010-09-14 NOTE — H&P (Signed)
NAME:  Crystal [name], Crystal [name]               ACCOUNT NO.:  [id number]   MEDICAL RECORD NO.:  [id number]          PATIENT TYPE:  INP   LOCATION:  5522                         FACILITY:  [facility]   PHYSICIAN:  Nestor Ramp, MD        DATE OF BIRTH:  July 30, 1975   DATE OF ADMISSION:  04/23/2007  DATE OF DISCHARGE:                              HISTORY & PHYSICAL   PRIMARY CARE PHYSICIAN:  [place] per Dr. Cleta Alberts.   NEUROLOGIST:  Dr. Nash Shearer   CHIEF COMPLAINT:  Nausea and vomiting.   HISTORY OF PRESENT ILLNESS:  This is a 36 year old white female recent  diagnosis of seizure disorder in August 2008 that complains of recurrent  nausea, vomiting that has worsened over the past month.  She has had 6  episodes of emesis today before going to [facility].  She was  sent from [facility] for further workup and rehydration.  She describes her  vomit as nonbloody and the color of her p.o. intake.  She denies any  abdominal pain, no fevers, no diarrhea.  Unable to take solid foods.  She is currently on her menstrual period and states that her symptoms do  not coincide with her periods.  She has had 3 generalized seizures this  month, currently treated with Dilantin and Lamictal.   PAST MEDICAL HISTORY:  1. Seizure disorder.  2. Chronic nausea and vomiting.  3. Anxiety.  4. History of visual hallucinations.  5. Migraines.  6. Tinnitus.   PAST SURGICAL HISTORY:  None.   MEDICATIONS:  1. Lamictal 150 mg p.o. b.i.d.  2. Dilantin 200 mg p.o. q.h.s.  3. Ativan unsure of dosage.   ALLERGIES:  No known drug allergies.   FAMILY HISTORY:  Mother has hypotension.   SOCIAL HISTORY:  She is engaged, planning to get married on December 25.  She has a 32 year old son who lives with her parents.  She is a  Metallurgist.  She has used occasional alcohol prior to August  of this year.  She smokes 1/2 pack-per-day for the past 15 years.  She  denies any drug abuse.   REVIEW OF SYSTEMS:  She has chronic  headaches, denies vision changes, no  fevers, no chills, no night sweats, positive weight loss, no dysphagia,  hematemesis, positive nausea and vomiting.  No chest pain, no shortness  of breath, no abdominal pain, no diarrhea, no melena, no dysuria,  positive difficulty initiating urination, no rashes, no focal weakness.   PHYSICAL EXAM:  The temperature was 97.4, pulse 79, blood pressure  116/78, respirations 20, 100% on room air.  GENERAL EXAM:  She is awake, cooperative, in no apparent distress.  HEENT:  She is atraumatic.  Extraocular movements intact.  Pupils equal,  round, and reactive to light and accommodation.  Tympanic membranes  normal.  Dry mucous membranes.  CARDIOVASCULAR:  Regular rate and rhythm.  No murmurs, rubs, or gallops.  Distinct S1 and S2.  RESPIRATORY:  Clear to auscultation bilaterally.  ABDOMEN:  Soft, nontender, nondistended.  Positive bowel sounds.  No  hepatosplenomegaly.  EXTREMITIES:  Warm, no  edema.  NEUROLOGIC:  Cranial nerves 2-12 grossly intact.  Sensation grossly  intact.  SKIN:  No rashes, no lesions, normal skin turgor.  PSYCH:  She alert and oriented x3.  No actively hallucinating.   ASSESSMENT AND PLAN:  This is a 35 year old white female with recurrent  nausea and vomiting.  1. Nausea and vomiting.  The patient denied actively vomiting.      Differential includes drug side effects versus infection versus      psychogenic.  We will treat symptomatically with Zofran.  We will      replete her fluid losses with normal saline with a liter of bolus      and then D5 half-normal saline at 125 for maintenance.  We will      check her CMET, lipase, CBC with differential.  We will add proton      pump inhibitor to her regimen.  We will check a urine pregnancy and      also a urine drug screen.  2. Seizure disorder.  We will continue her on her home regimen of      Dilantin 250 q.h.s. and Lamictal 100 mg b.i.d.  We will also check      a Dilantin  level.  3. Anxiety.  We will place her on low dose Ativan 0.5 mg IV q.8h.      p.r.n.  4. Migraines.  We will treat with acetaminophen but will likely switch      to Midrin 2 caps initially, being careful not to exceed 2 g of      acetaminophen in 24 hours given her Dilantin.  5. History of hallucinations.  She has been previously on Zyprexa, but      she has discontinued that use.  She may benefit from a psych      consult.  We will discuss this at morning rounds.  6. Depression.  She was previously on Lexapro.  The patient does not      desire to restart the medication.  7. Fluid, electrolytes, and nutrition.  We will check a UA, continue      her on IV fluids, place her on a clear diet and monitor strict I's      and O's.   DISPOSITION:  She will be admitted on observation 12 floor bed.  She is  a full code.      Marisue Ivan, MD  Electronically Signed      Nestor Ramp, MD  Electronically Signed    KL/MEDQ  D:  04/23/2007  T:  04/24/2007  Job:  [id number]

## 2010-10-14 ENCOUNTER — Other Ambulatory Visit: Payer: Self-pay

## 2010-10-14 HISTORY — PX: BIOPSY BREAST: PRO8

## 2010-10-20 ENCOUNTER — Other Ambulatory Visit: Payer: Self-pay | Admitting: Oncology

## 2010-10-20 ENCOUNTER — Encounter (HOSPITAL_BASED_OUTPATIENT_CLINIC_OR_DEPARTMENT_OTHER): Payer: PRIVATE HEALTH INSURANCE | Admitting: Oncology

## 2010-10-20 DIAGNOSIS — C50919 Malignant neoplasm of unspecified site of unspecified female breast: Secondary | ICD-10-CM

## 2010-10-20 DIAGNOSIS — C50419 Malignant neoplasm of upper-outer quadrant of unspecified female breast: Secondary | ICD-10-CM

## 2010-10-20 LAB — CBC WITH DIFFERENTIAL/PLATELET
BASO%: 0 % (ref 0.0–2.0)
Eosinophils Absolute: 0.2 10*3/uL (ref 0.0–0.5)
MCHC: 35.1 g/dL (ref 31.5–36.0)
MONO#: 0.4 10*3/uL (ref 0.1–0.9)
NEUT#: 7.7 10*3/uL — ABNORMAL HIGH (ref 1.5–6.5)
Platelets: 241 10*3/uL (ref 145–400)
RBC: 4.65 10*6/uL (ref 3.70–5.45)
RDW: 12.2 % (ref 11.2–14.5)
WBC: 9.9 10*3/uL (ref 3.9–10.3)
lymph#: 1.6 10*3/uL (ref 0.9–3.3)

## 2010-10-20 LAB — COMPREHENSIVE METABOLIC PANEL
ALT: 17 U/L (ref 0–35)
CO2: 25 mEq/L (ref 19–32)
Calcium: 10 mg/dL (ref 8.4–10.5)
Chloride: 101 mEq/L (ref 96–112)
Glucose, Bld: 79 mg/dL (ref 70–99)
Sodium: 138 mEq/L (ref 135–145)
Total Bilirubin: 0.2 mg/dL — ABNORMAL LOW (ref 0.3–1.2)
Total Protein: 7.2 g/dL (ref 6.0–8.3)

## 2010-10-20 LAB — CANCER ANTIGEN 27.29: CA 27.29: 32 U/mL (ref 0–39)

## 2010-10-21 ENCOUNTER — Ambulatory Visit (HOSPITAL_COMMUNITY)
Admission: RE | Admit: 2010-10-21 | Discharge: 2010-10-21 | Disposition: A | Discharge status: Home / Self Care | Payer: PRIVATE HEALTH INSURANCE | Source: Ambulatory Visit | Attending: Family Medicine | Admitting: Family Medicine

## 2010-10-21 ENCOUNTER — Other Ambulatory Visit (INDEPENDENT_AMBULATORY_CARE_PROVIDER_SITE_OTHER): Payer: Self-pay | Admitting: General Surgery

## 2010-10-21 DIAGNOSIS — Z0389 Encounter for observation for other suspected diseases and conditions ruled out: Secondary | ICD-10-CM | POA: Insufficient documentation

## 2010-10-21 DIAGNOSIS — C50911 Malignant neoplasm of unspecified site of right female breast: Secondary | ICD-10-CM

## 2010-10-27 ENCOUNTER — Ambulatory Visit (HOSPITAL_COMMUNITY)
Admission: RE | Admit: 2010-10-27 | Discharge: 2010-10-27 | Disposition: A | Discharge status: Home / Self Care | Payer: PRIVATE HEALTH INSURANCE | Source: Ambulatory Visit | Attending: Oncology | Admitting: Oncology

## 2010-10-27 ENCOUNTER — Encounter (HOSPITAL_COMMUNITY): Payer: Self-pay

## 2010-10-27 ENCOUNTER — Encounter (HOSPITAL_COMMUNITY)
Admission: RE | Admit: 2010-10-27 | Discharge: 2010-10-27 | Disposition: A | Discharge status: Home / Self Care | Payer: Self-pay | Source: Ambulatory Visit | Attending: Oncology | Admitting: Oncology

## 2010-10-27 DIAGNOSIS — C50919 Malignant neoplasm of unspecified site of unspecified female breast: Secondary | ICD-10-CM | POA: Insufficient documentation

## 2010-10-27 DIAGNOSIS — J984 Other disorders of lung: Secondary | ICD-10-CM | POA: Insufficient documentation

## 2010-10-27 DIAGNOSIS — M545 Low back pain, unspecified: Secondary | ICD-10-CM | POA: Insufficient documentation

## 2010-10-27 HISTORY — DX: Malignant (primary) neoplasm, unspecified: C80.1

## 2010-10-27 MED ORDER — IOHEXOL 300 MG/ML  SOLN
80.0000 mL | Freq: Once | INTRAMUSCULAR | Status: AC | PRN
  Administered 2010-10-27: 80 mL via INTRAVENOUS

## 2010-10-27 MED ORDER — TECHNETIUM TC 99M MEDRONATE IV KIT
24.0000 | PACK | Freq: Once | INTRAVENOUS | Status: AC | PRN
  Administered 2010-10-27: 24 via INTRAVENOUS

## 2010-11-01 ENCOUNTER — Encounter (HOSPITAL_BASED_OUTPATIENT_CLINIC_OR_DEPARTMENT_OTHER): Payer: PRIVATE HEALTH INSURANCE | Admitting: Oncology

## 2010-11-01 DIAGNOSIS — C50419 Malignant neoplasm of upper-outer quadrant of unspecified female breast: Secondary | ICD-10-CM

## 2010-11-05 ENCOUNTER — Other Ambulatory Visit (HOSPITAL_COMMUNITY): Payer: PRIVATE HEALTH INSURANCE

## 2010-11-05 ENCOUNTER — Encounter (HOSPITAL_COMMUNITY)
Admission: RE | Admit: 2010-11-05 | Discharge: 2010-11-05 | Disposition: A | Discharge status: Home / Self Care | Payer: Self-pay | Source: Ambulatory Visit | Attending: General Surgery | Admitting: General Surgery

## 2010-11-05 DIAGNOSIS — Z01818 Encounter for other preprocedural examination: Secondary | ICD-10-CM | POA: Insufficient documentation

## 2010-11-05 DIAGNOSIS — Z01812 Encounter for preprocedural laboratory examination: Secondary | ICD-10-CM | POA: Insufficient documentation

## 2010-11-05 LAB — CBC
Platelets: 230 10*3/uL (ref 150–400)
RBC: 4.59 MIL/uL (ref 3.87–5.11)
RDW: 11.8 % (ref 11.5–15.5)
WBC: 10.2 10*3/uL (ref 4.0–10.5)

## 2010-11-05 LAB — SURGICAL PCR SCREEN: MRSA, PCR: NEGATIVE

## 2010-11-10 ENCOUNTER — Ambulatory Visit (HOSPITAL_COMMUNITY)
Admission: RE | Admit: 2010-11-10 | Discharge: 2010-11-10 | Disposition: A | Discharge status: Home / Self Care | Payer: Self-pay | Source: Ambulatory Visit | Attending: General Surgery | Admitting: General Surgery

## 2010-11-10 ENCOUNTER — Other Ambulatory Visit (INDEPENDENT_AMBULATORY_CARE_PROVIDER_SITE_OTHER): Payer: Self-pay | Admitting: General Surgery

## 2010-11-10 DIAGNOSIS — Z01811 Encounter for preprocedural respiratory examination: Secondary | ICD-10-CM

## 2010-11-10 DIAGNOSIS — Z538 Procedure and treatment not carried out for other reasons: Secondary | ICD-10-CM | POA: Insufficient documentation

## 2010-11-10 DIAGNOSIS — Z01818 Encounter for other preprocedural examination: Secondary | ICD-10-CM | POA: Insufficient documentation

## 2010-11-10 DIAGNOSIS — C50911 Malignant neoplasm of unspecified site of right female breast: Secondary | ICD-10-CM

## 2010-11-10 DIAGNOSIS — C50919 Malignant neoplasm of unspecified site of unspecified female breast: Secondary | ICD-10-CM | POA: Insufficient documentation

## 2010-11-10 LAB — COMPREHENSIVE METABOLIC PANEL
ALT: 44 U/L — ABNORMAL HIGH (ref 0–35)
AST: 43 U/L — ABNORMAL HIGH (ref 0–37)
CO2: 24 mEq/L (ref 19–32)
Calcium: 9.7 mg/dL (ref 8.4–10.5)
Creatinine, Ser: 1.01 mg/dL (ref 0.50–1.10)
GFR calc Af Amer: >60 mL/min (ref >60)
GFR calc non Af Amer: >60 mL/min (ref >60)
Glucose, Bld: 91 mg/dL (ref 70–99)
Sodium: 140 mEq/L (ref 135–145)
Total Protein: 7.2 g/dL (ref 6.0–8.3)

## 2010-11-10 LAB — HCG, SERUM, QUALITATIVE: Preg, Serum: NEGATIVE

## 2010-11-11 ENCOUNTER — Other Ambulatory Visit (INDEPENDENT_AMBULATORY_CARE_PROVIDER_SITE_OTHER): Payer: Self-pay | Admitting: General Surgery

## 2010-11-11 DIAGNOSIS — C50911 Malignant neoplasm of unspecified site of right female breast: Secondary | ICD-10-CM

## 2010-11-16 ENCOUNTER — Ambulatory Visit (HOSPITAL_COMMUNITY)
Admission: RE | Admit: 2010-11-16 | Discharge: 2010-11-16 | Disposition: A | Discharge status: Home / Self Care | Payer: Self-pay | Source: Ambulatory Visit | Attending: General Surgery | Admitting: General Surgery

## 2010-11-16 ENCOUNTER — Other Ambulatory Visit (INDEPENDENT_AMBULATORY_CARE_PROVIDER_SITE_OTHER): Payer: Self-pay | Admitting: General Surgery

## 2010-11-16 ENCOUNTER — Ambulatory Visit (HOSPITAL_COMMUNITY)
Admission: RE | Admit: 2010-11-16 | Discharge: 2010-11-18 | Disposition: A | Discharge status: Home / Self Care | Payer: Self-pay | Source: Ambulatory Visit | Attending: General Surgery | Admitting: General Surgery

## 2010-11-16 ENCOUNTER — Ambulatory Visit (HOSPITAL_COMMUNITY): Payer: Self-pay

## 2010-11-16 DIAGNOSIS — Z01812 Encounter for preprocedural laboratory examination: Secondary | ICD-10-CM | POA: Insufficient documentation

## 2010-11-16 DIAGNOSIS — Z87891 Personal history of nicotine dependence: Secondary | ICD-10-CM | POA: Insufficient documentation

## 2010-11-16 DIAGNOSIS — K219 Gastro-esophageal reflux disease without esophagitis: Secondary | ICD-10-CM | POA: Insufficient documentation

## 2010-11-16 DIAGNOSIS — N6039 Fibrosclerosis of unspecified breast: Secondary | ICD-10-CM

## 2010-11-16 DIAGNOSIS — C50919 Malignant neoplasm of unspecified site of unspecified female breast: Secondary | ICD-10-CM

## 2010-11-16 DIAGNOSIS — Z4001 Encounter for prophylactic removal of breast: Secondary | ICD-10-CM

## 2010-11-16 DIAGNOSIS — C50911 Malignant neoplasm of unspecified site of right female breast: Secondary | ICD-10-CM

## 2010-11-16 DIAGNOSIS — F411 Generalized anxiety disorder: Secondary | ICD-10-CM | POA: Insufficient documentation

## 2010-11-16 HISTORY — PX: PORTACATH PLACEMENT: SHX2246

## 2010-11-16 HISTORY — PX: BREAST SURGERY: SHX581

## 2010-11-16 LAB — BASIC METABOLIC PANEL
CO2: 24 mEq/L (ref 19–32)
Chloride: 105 mEq/L (ref 96–112)
Creatinine, Ser: 0.87 mg/dL (ref 0.50–1.10)
GFR calc Af Amer: >60 mL/min (ref >60)
Potassium: 4.5 mEq/L (ref 3.5–5.1)

## 2010-11-16 LAB — HCG, SERUM, QUALITATIVE: Preg, Serum: NEGATIVE

## 2010-11-16 MED ORDER — TECHNETIUM TC 99M SULFUR COLLOID FILTERED
1.0000 | Freq: Once | INTRAVENOUS | Status: AC | PRN
  Administered 2010-11-16: 1 via INTRADERMAL

## 2010-11-17 NOTE — Op Note (Signed)
Crystal [name], Crystal [name]               ACCOUNT NO.:  [id number]  MEDICAL RECORD NO.:  [id number]  LOCATION:  5128                         FACILITY:  [facility]  PHYSICIAN:  Ollen Gross. Vernell Morgans, M.D. DATE OF BIRTH:  1976/04/16  DATE OF PROCEDURE:  11/16/2010 DATE OF DISCHARGE:                              OPERATIVE REPORT   PREOPERATIVE DIAGNOSIS:  Right breast cancer.  POSTOPERATIVE DIAGNOSIS:  Right breast cancer.  PROCEDURE:  Placement of left subclavian vein Port-A-Cath, bilateral mastectomies, right sentinel lymph node biopsy and subsequent right axillary lymph node dissection with injection of blue dye.  SURGEON:  Ollen Gross. Vernell Morgans, MD  ASSISTANTS:  Velora Heckler, MD and Brayton El, PA-C  ANESTHESIA:  General endotracheal.  PROCEDURE:  After informed consent was obtained, the patient was brought to the operating room, placed in supine position on the operating table. After adequate induction of general anesthesia, the patient's left arm was tucked, a roll was placed behind the patient's shoulder blades to extend the shoulder slightly.  Her left chest and neck area were then prepped with ChloraPrep, allowed to dry and draped in usual sterile manner.  The patient was placed in Trendelenburg position.  A small stab incision was made just lateral to the bend of the clavicle on the left chest.  A large bore finder needle from the Port-A-Cath kit was then used to slide beneath the bend of the clavicle heading towards the sternal notch and then doing this we were able to easily access the left subclavian vein.  A wire was placed through the needle using the Seldinger technique without difficulty.  The wire was confirmed in the central venous system using real time fluoroscopy.  Next, the incision was extended medially and laterally at short distance.  A subcutaneous pocket was created by combination of blunt finger dissection and some sharp dissection with the electrocautery.  The  tubing was then placed on the reservoir.  The reservoir was placed in the pocket.  The length of the tubing was estimated using real time fluoroscopy.  Next, a sheath and dilator were then placed over the wire also using the Seldinger technique without difficulty.  The dilator and wire were then removed. A thumb was placed over the opening of the sheath.  The tubing was fed through the sheath as far as it could be fed and then held in place while the sheath was gently cracked and separated keeping the tubing in place.  Once this was accomplished, another real time fluoro image showed the tip of the catheter to be in the superior vena cava and the catheter was in good position.  At this point, the anchor was placed on the tubing and reservoir.  The reservoir was anchored in the pocket with two 2-0 Prolene stitches.  The port aspirated blood easily and was flushed initially with a dilute heparin solution, then with a more concentrated heparin solution.  The deeper layer of the wound was closed over top of the port with interrupted 3-0 Vicryl stitches and the skin was closed with interrupted 4-0 Monocryl subcuticular stitches. Dermabond dressing was then applied.  The drapes were then removed and then the patient's chest  and axillary areas bilaterally were prepped again with ChloraPrep, allowed to dry and then draped in usual sterile manner.  At this point, 2 mL of methylene blue and 3 mL of injectable saline were also injected in subareolar position on the right.  Earlier in the day, the patient had undergone injection of 1 mCi technetium sulfur colloid on the right.  At this point, an elliptical incisions were mapped out on both breast for symmetry around the nipple-areolar complex to minimize the excess skin.  Attention was first turned to the left breast, the incision was then made with a 10 blade knife along the mapped out lines.  This was done with a 10 blade knife.  The incision was  carried down through the skin and subcutaneous tissue sharply with electrocautery until the breast tissue was entered.  Breast hooks were then used to elevate the skin flaps anteriorly towards the ceiling and thin skin flaps were created circumferentially around the incision until the dissection met the chest wall superiorly, inferiorly and medially and laterally.  The dissection was carried down to the latissimus muscle laterally.  Once this was accomplished, then the breast tissue was removed from the pectoralis muscle with pectoralis fascia.  Once this was accomplished, the left breast was removed from the patient, it was oriented with a stitch on the lateral aspect of the breast and sent to Pathology.  The wound was irrigated with copious amounts of saline. Hemostasis was achieved using the  Bovie electrocautery.  A single stab incision was made in the anterior axillary line beneath the operative bed with a 15 blade knife.  A tonsil clamp was then placed through the opening into the operative bed and used to bring a 19-French round Blake drain into the operative bed.  The drain was placed along the chest wall.  The drain was anchored to the skin with 3-0 nylon stitch.  The skin flaps were then grossly reapproximated with interrupted 3-0 Vicryl stitches and then the skin was closed with a running 4-0 Monocryl and subcuticular stitches.  Dermabond dressing was applied.  The drain was placed to bulb suction.  There was a good seal.  The skin appeared to be healthy.  Next, attention was then turned to the right breast.  The incision again was made with a 10 blade knife along the mapped outlines. This was carried down through the skin and subcutaneous tissue sharply with the electrocautery.  Once into the breast tissues, breast hooks were used to elevate the skin flaps anteriorly.  Thin skin flaps were then created circumferentially around the incision until the dissection was carried down  all the way to the chest wall medially, superiorly and inferiorly.  Laterally, the dissection was carried down to the latissimus muscle.  Once we got the dissection out lateral, we used the NeoProbe to identify hot spot in the right axilla, using a combination of sharp Bovie dissection and some blunt tonsil dissection we were able to identify a hot lymph node.  It was excised sharply with the electrocautery.  Ex vivo counts on this node were about 150.  It was sent to pathology as sentinel node #1.  No other hot blue or palpable nodes were identified in the right axilla.  At this point, the breast was then removed from the pectoralis muscle of the chest wall with the pectoralis fascia.  This was also done sharply with the electrocautery. Once the breast was removed, then it was oriented with a stitch on the  lateral aspect and sent to Pathology.  At this point, Pathology called back and said that our sentinel node was positive.  We therefore identified the chest wall muscle medially, the latissimus muscle laterally and the axillary vein superiorly, and we carefully dissected out the contents of the axilla between these landmarks, most of the dissection was blunt right angle dissection somewhat sharp with the electrocautery.  We were able to identify the long thoracic and thoracodorsal and neurovascular complexes and spare these nerves.  Once the axillary contents were removed, they were sent separately as right axillary contents.  The wound was irrigated with copious amounts of saline.  Hemostasis was achieved using the Bovie electrocautery.  Two stab wounds were made in the anterior axillary line on the right. Tonsil clamp was placed through each of these stab wounds into the operative bed and used to bring 19-French round Blake drains into the operative bed.  The lateral drain was placed in the axilla.  The medial drain was placed along the chest wall.  The drains were anchored to  the skin with 3-0 nylon stitches.  The skin flaps were then grossly reapproximated with interrupted 3-0 Vicryl stitches and the skin was closed with running 4-0 Monocryl subcuticular stitches.  Dermabond dressings were then applied.  The drains were placed to bulb suction and a good seal.  Sterile dressings were then applied.  The patient tolerated the procedure well.  At the end of the case, all needle, sponge and instrument counts were correct.  The patient was then awakened and taken to recovery room in stable condition.     Ollen Gross. Vernell Morgans, M.D.    PST/MEDQ  D:  11/16/2010  T:  11/17/2010  Job:  [id number]  Electronically Signed by Chevis Pretty III M.D. on 11/17/2010 01:51:54 PM

## 2010-11-23 ENCOUNTER — Ambulatory Visit (INDEPENDENT_AMBULATORY_CARE_PROVIDER_SITE_OTHER): Payer: PRIVATE HEALTH INSURANCE | Admitting: General Surgery

## 2010-11-23 VITALS — BP 98/68 | HR 76 | Temp 98.6°F

## 2010-11-23 DIAGNOSIS — Z4889 Encounter for other specified surgical aftercare: Secondary | ICD-10-CM

## 2010-11-23 DIAGNOSIS — Z5189 Encounter for other specified aftercare: Secondary | ICD-10-CM

## 2010-11-23 MED ORDER — CLINDAMYCIN HCL 300 MG PO CAPS: 300.0000 mg | ORAL_CAPSULE | Freq: Four times a day (QID) | ORAL | Status: AC

## 2010-11-23 MED ORDER — OXYCODONE-ACETAMINOPHEN 5-325 MG PO TABS: 1.0000 | ORAL_TABLET | ORAL | Status: DC | PRN

## 2010-11-23 MED ORDER — DOXYCYCLINE HYCLATE 100 MG PO TABS: 100.0000 mg | ORAL_TABLET | Freq: Two times a day (BID) | ORAL | Status: AC

## 2010-11-23 NOTE — Progress Notes (Signed)
Subjective:     Patient ID: Crystal [name], female   DOB: 05/12/1975, 35 y.o.   MRN: [id number]  HPI This patient follows up bilateral mastectomy by Dr. Carolynne Edouard proximally 6 days ago. She had been improving and was feeling pretty well and began cleaning her house and doing other activities roughly 2 days ago. Yesterday she began having increasing pain and changing appearance of the drainage from her JP drains. She also states that she was weaned herself off the pain medication and today has been in severe pain and only resting. She states that she had a temperature of 101.5 as well.  Review of Systems     Objective:   Physical Exam Her temperature is 98.6 her heart rate is 76 and her blood pressure is 98/68.  She is lying on the bed appears uncomfortable and cannot raise her right arm limited only by pain. Her incisions are healing well without any sign of necrosis. She does have approximately 1 inch rim of erythema around her right mastectomy scar with some blanching. Her JPs appear appropriate with serous output in all 3 drains. I do not appreciate any fluctuance induration or any undrained fluid collection on exam. Bedside ultrasound does not show any residual fluid collection as well although exam of the axilla is limited due to the patient's discomfort and inability to lift her arm.   Assessment:     S/p bilateral mastectomy with postop pain    Plan:     She is very uncomfortable I did not see any sign of any particular postoperative problem. She does have some slight erythema around her right mastectomy scar which may be an early cellulitis. I have recommended clindamycin for treatment of this. Did not see any undrained fluid collection or evidence of abscess. I think a lot of her discomfort is due to increasing activity and try to decrease her pain medication. I will refill her pain medication and she is scheduled already to followup with Dr. Carolynne Edouard in 2 days. We will see her back then to  evaluate for any progress or worsening of her symptoms.

## 2010-11-25 ENCOUNTER — Ambulatory Visit (INDEPENDENT_AMBULATORY_CARE_PROVIDER_SITE_OTHER): Payer: Self-pay | Admitting: General Surgery

## 2010-11-25 ENCOUNTER — Ambulatory Visit: Payer: PRIVATE HEALTH INSURANCE | Admitting: Obstetrics & Gynecology

## 2010-11-25 ENCOUNTER — Encounter (INDEPENDENT_AMBULATORY_CARE_PROVIDER_SITE_OTHER): Payer: Self-pay | Admitting: General Surgery

## 2010-11-25 ENCOUNTER — Encounter: Payer: Self-pay | Admitting: Family Medicine

## 2010-11-25 ENCOUNTER — Ambulatory Visit (INDEPENDENT_AMBULATORY_CARE_PROVIDER_SITE_OTHER): Payer: Self-pay | Admitting: Family Medicine

## 2010-11-25 VITALS — BP 105/72 | HR 94 | Temp 98.3°F | Ht 64.0 in | Wt 151.7 lb

## 2010-11-25 DIAGNOSIS — C50919 Malignant neoplasm of unspecified site of unspecified female breast: Secondary | ICD-10-CM | POA: Insufficient documentation

## 2010-11-25 DIAGNOSIS — R102 Pelvic and perineal pain: Secondary | ICD-10-CM

## 2010-11-25 DIAGNOSIS — N949 Unspecified condition associated with female genital organs and menstrual cycle: Secondary | ICD-10-CM

## 2010-11-25 NOTE — Patient Instructions (Signed)
Pap Smear A Pap smear is a sampling of cells from a woman's cervix. The cervix is the opening between the vagina (birth canal) and the uterus (the bottom part of the womb). The cells are scraped from the cervix during a pelvic exam. These cells are then looked at under a microscope to see if the cells are normal or to see if a cancer is developing or there are changes that suggest a cancer will develop. Cervical dysplasia is a condition in which a woman has abnormal changes in the top layer of cells of her cervix. These changes are an early sign that cervical cancer may develop. Pap smears also look for the human papilloma virus (HPV) because it has 4 types that are responsible for 70% of cervical cancer. Infections can also be found during a Pap smear such as bacteria, fungus, protozoa and viruses.  Cervical cancer is harder to treat and less likely to have a good outcome if left untreated. Catching the disease at an early stage leads to a better outcome. Since the Pap smear was introduced 60 years ago, deaths from cervical cancer have decreased by 70%. Every woman should keep up to date with Pap smears. RISK FACTORS FOR CERVICAL CANCER INCLUDE:   Becoming sexually active before age 18.   Being the daughter of a woman who took diethylstilbestrol (DES) during pregnancy.   Having a sexual partner who has or has had cancer of the penis.   Having a sexual partner whose past partner had cervical cancer or cervical dysplasia (early cell changes which suggest a cancer may develop).   Having a weakened immune system. An example would be HIV or other immunodeficiency disorder.   Having had a sexually transmitted infection such as chlamydia, gonorrhea or HPV.   Having had an abnormal Pap smear or cancer of the vagina or vulva.   Having had more than one sexual partner.   A history of cervical cancer in a woman's sister or mother.   Not using condoms with new sexual partners.   Smoking.  WHO SHOULD  HAVE PAP SMEARS  A PAP smear is done to screen for cervical cancer.   The first PAP smear should be done at age 21.   Between ages 21 and 29, PAP smears are repeated every 2 years.   Beginning at age 30, you are advised to have a PAP smear every 3 years as long as your past 3 PAP smears have been normal.   Some women have medical problems that increase the chance of getting cervical cancer. Talk to your caregiver about these problems. It is especially important to talk to your caregiver if a new problem develops soon after your last PAP smear. In these cases, your caregiver may recommend more frequent screening and Pap smears.   The above recommendations are the same for women who have or have not gotten the vaccine for HPV (Human Papillomavirus).   If you had a hysterectomy for a problem that was not a cancer or a condition that could lead to cancer, then you no longer need Pap smears.   If you are between ages 65 and 70, and you have had normal Pap smears going back 10 years, you no longer need Pap smears.   If you have had past treatment for cervical cancer or a condition that could lead to cancer, you need Pap smears and screening for cancer for at least 20 years after your treatment.   Some women may need screenings   more often if they are at high risk for cervical cancer.  PREPARATION FOR A PAP SMEAR A Pap smear should be performed during the weeks before the start of menstruation. Women should not douche or have sexual intercourse for 24 hours before the test. No vaginal creams, diaphragms, or tampons should be used for 24 hours before the test. To minimize discomfort, a woman should empty her bladder just before the exam. TAKING THE PAP SMEAR The caregiver will perform a pelvic exam. A metal or plastic instrument (speculum) is placed in the vagina. This is done before your caregiver does a bimanual exam of your internal female organs. This instrument allows your caregiver to see the  inside of the vagina and look at the cervix. A small, sterile brush is used to take a sample of cells from the internal opening of the cervix. A small wooden spatula is used to scrape the outside of the cervix. Neither of these two methods to collect cells will cause you pain. These two scrapings are placed on a glass slide or in a small bottle filled with a special liquid. The cells are looked at later under a microscope in a lab. A specialist will look at these cells and determine if the cells are normal. RESULTS OF YOUR PAP SMEAR  A healthy Pap smear shows no abnormal cells or evidence of inflammation.   The presence of abnormally growing cells on the surface of the cervix may be reported as an abnormal PAP smear. Different categories of findings are used to describe your Pap smear. Your caregiver will go over the importance of these findings with you. The caregiver will then determine what follow-up is needed or when you should have your next pap smear.   If you have had two or more abnormal Pap smears:   You may be asked to have a colposcopy. This is a test in which the cervix is viewed with a special lighted microscope.   A cervical tissue sample (biopsy) may also be needed. This involves taking a small tissue sample from the cervix. The sample is looked at under a microscope to find the cause of the abnormal cells.  Make sure you find out the results of the Pap smear. If you have not received the results within two weeks, contact your caregiver's office for the results. Do not assume everything is normal if you have not heard from your caregiver or medical facility. It is important to follow up on all of your test results. Document Released: 07/09/2002 Document Re-Released: 03/31/2008 [employer] Patient Information 2011 [employer]. 

## 2010-11-25 NOTE — Progress Notes (Signed)
Subjective:    Patient ID: Crystal [name], female    DOB: 1975/05/20, 35 y.o.   MRN: [id number]  Pelvic Pain The patient's primary symptoms include pelvic pain. The patient's pertinent negatives include no vaginal discharge. Associated symptoms include abdominal pain, constipation and nausea. Pertinent negatives include no chills, diarrhea, dysuria, fever, flank pain, urgency or vomiting.   Pt is 35-yo G1P1 who was recently diagnosed with breast cancer, HER2, high grade invasive ductal cancer. She had a bilateral mastectomy with lymph node biopsies about 2 weeks ago. She will be starting her course of chemotherapy after she is healed from the surgery. She states she had a pap test one year ago that was normal. She denies a history of abnormal pap smears. She presents today for concern of abdominal/pelvic pain, especially when having intercourse. She feels bloated at times. She had a [place] about 2 years ago that showed some cysts on her ovaries. This pain has been presents "for years" but has been getting worse over the last few months. She states she usually has normal, regular bowel movements, but there was a time in the past she had difficulty with constipation. Prior the surgery, she was more regular with her BM. She now is having more constipation while on the narcotic pain medication. She has a history irregular menses but recently has been more regular, and she states her bleeding is heavy in the beginning, then it will lighten up to spotting. She has history of herpes, but no other STDs. She had one vaginal delivery without complications.  Social History: No tobacco, no ETOH, no drugs. Married, lives with husband and 15-yr old son.   Review of Systems  Constitutional: Positive for appetite change, fatigue and unexpected weight change. Negative for fever and chills.  HENT: Negative.   Eyes: Negative.   Respiratory: Negative.   Cardiovascular: Negative.   Gastrointestinal: Positive for nausea,  abdominal pain, constipation and abdominal distention. Negative for vomiting, diarrhea, blood in stool and rectal pain.  Genitourinary: Positive for difficulty urinating, pelvic pain and dyspareunia. Negative for dysuria, urgency, flank pain, decreased urine volume, vaginal bleeding, vaginal discharge, genital sores and vaginal pain.  Musculoskeletal: Negative.   Neurological: Negative.   Hematological: Negative.   Psychiatric/Behavioral: Negative.        Filed Vitals:   11/25/10 1420  BP: 105/72  Pulse: 94  Temp: 98.3 F (36.8 C)  TempSrc: Oral  Height: 5\' 4"  (1.626 m)  Weight: 151 lb 11.2 oz (68.811 kg)   Objective:   Physical Exam  Nursing note and vitals reviewed. Constitutional: She is oriented to person, place, and time. She appears well-developed and well-nourished. No distress.  HENT:  Head: Normocephalic.  Neck: Normal range of motion.  Cardiovascular: Normal rate, regular rhythm and normal heart sounds.   No murmur heard. Pulmonary/Chest: Breath sounds normal. No respiratory distress. She has no wheezes.  Abdominal: Soft. Bowel sounds are normal. She exhibits no mass. There is tenderness. There is no rebound and no guarding.       TTP of RT upper and lower quadrants  Genitourinary: Vagina normal. There is no rash, tenderness, lesion or injury on the right labia. There is no rash, tenderness, lesion or injury on the left labia. Uterus is not deviated, not enlarged, not fixed and not tender. Cervix exhibits no motion tenderness, no discharge and no friability. Right adnexum displays tenderness. Right adnexum displays no mass and no fullness. Left adnexum displays no mass, no tenderness and no fullness. No erythema,  tenderness or bleeding around the vagina. No foreign body around the vagina. No vaginal discharge found.  Neurological: She is alert and oriented to person, place, and time. No cranial nerve deficit.  Skin: Skin is warm and dry. She is not diaphoretic.           Assessment & Plan:  1. Pelvic Pain: Pap test today and will order [place] to evaluate pelvic organs. Discussed that if this is a normal study, the next most appropriate place to assess for the source of her pain and discomfort would be colon. Pt to f/u in 2 wks or as available for her [place] results. 2. Breast Cancer: Cont with care as planned with her oncologist.

## 2010-11-25 NOTE — Patient Instructions (Signed)
Continue doxycycline twice a day Leave drains in for now F/U Monday

## 2010-11-26 ENCOUNTER — Ambulatory Visit (HOSPITAL_COMMUNITY)
Admission: RE | Admit: 2010-11-26 | Discharge: 2010-11-26 | Disposition: A | Discharge status: Home / Self Care | Payer: Self-pay | Source: Ambulatory Visit | Attending: Family Medicine | Admitting: Family Medicine

## 2010-11-26 DIAGNOSIS — R102 Pelvic and perineal pain: Secondary | ICD-10-CM

## 2010-11-26 DIAGNOSIS — IMO0002 Reserved for concepts with insufficient information to code with codable children: Secondary | ICD-10-CM | POA: Insufficient documentation

## 2010-11-26 DIAGNOSIS — C50919 Malignant neoplasm of unspecified site of unspecified female breast: Secondary | ICD-10-CM | POA: Insufficient documentation

## 2010-11-26 DIAGNOSIS — N949 Unspecified condition associated with female genital organs and menstrual cycle: Secondary | ICD-10-CM | POA: Insufficient documentation

## 2010-11-26 DIAGNOSIS — N83209 Unspecified ovarian cyst, unspecified side: Secondary | ICD-10-CM | POA: Insufficient documentation

## 2010-11-29 ENCOUNTER — Encounter (INDEPENDENT_AMBULATORY_CARE_PROVIDER_SITE_OTHER): Payer: Self-pay | Admitting: General Surgery

## 2010-11-29 ENCOUNTER — Ambulatory Visit (INDEPENDENT_AMBULATORY_CARE_PROVIDER_SITE_OTHER): Payer: Self-pay | Admitting: General Surgery

## 2010-11-29 DIAGNOSIS — C50919 Malignant neoplasm of unspecified site of unspecified female breast: Secondary | ICD-10-CM

## 2010-11-29 NOTE — Progress Notes (Signed)
Subjective:     Patient ID: Crystal [name], female   DOB: Mar 18, 1976, 35 y.o.   MRN: [id number]  HPI The patient is a 35 her white female who is now about 10 days out from bilateral mastectomies and a right axillary node dissection. She came in a couple days ago with pain and redness on the right. She was started on doxycycline. Her drains are putting out around 30-40 cc a day of serous fluid.  Review of Systems     Objective:   Physical Exam On exam both mastectomy incisions are healing nicely. She does have some redness to the skin on the right chest wall. No evidence of purulence from the drains.    Assessment:     About 10 days status post bilateral mastectomies and right axillary lymph node dissection.    Plan:     At this point with the question of an infection on the right I would prefer to leave the drains in for now. She will continue her doxycycline. And we will plan to see her back again on Monday to recheck her chest wall. I have given her a copy of her pathology to review.

## 2010-11-29 NOTE — Progress Notes (Signed)
Subjective:     Patient ID: Crystal [name], female   DOB: 01/13/1976, 35 y.o.   MRN: [id number]  HPI The patient is a 35 white female who is now a couple weeks out from a bilateral mastectomy and right axillary node dissection. She is ER/PR positive and HER-2 positive. Since Friday her tenderness and redness of her right chest wall has significantly improved. She is having minimal output from all her drains.  Review of Systems     Objective:   Physical Exam On exam her chest wall incisions are healing very nicely the cellulitis of her right chest wall is almost completely resolved. Her drains had minimal output. The chest wall drained from each side was removed today. She tolerated this well. Her last drain is the right axillary drain.    Assessment:     2 weeks out from a bilateral mastectomy and right axillary node dissection for a right T1 C. N1 A. breast cancer    Plan:     She will continue her doxycycline. We will plan to see her back on Friday at which time we will try to remove the last drain.

## 2010-11-29 NOTE — Patient Instructions (Signed)
Continue to record drain output Continue doxycycline F/U friday

## 2010-12-03 ENCOUNTER — Ambulatory Visit (INDEPENDENT_AMBULATORY_CARE_PROVIDER_SITE_OTHER): Payer: Self-pay | Admitting: General Surgery

## 2010-12-03 ENCOUNTER — Other Ambulatory Visit: Payer: Self-pay | Admitting: Oncology

## 2010-12-03 ENCOUNTER — Encounter (HOSPITAL_BASED_OUTPATIENT_CLINIC_OR_DEPARTMENT_OTHER): Payer: Self-pay | Admitting: Oncology

## 2010-12-03 ENCOUNTER — Encounter (INDEPENDENT_AMBULATORY_CARE_PROVIDER_SITE_OTHER): Payer: Self-pay | Admitting: General Surgery

## 2010-12-03 DIAGNOSIS — G44009 Cluster headache syndrome, unspecified, not intractable: Secondary | ICD-10-CM

## 2010-12-03 DIAGNOSIS — C50919 Malignant neoplasm of unspecified site of unspecified female breast: Secondary | ICD-10-CM

## 2010-12-03 DIAGNOSIS — C50419 Malignant neoplasm of upper-outer quadrant of unspecified female breast: Secondary | ICD-10-CM

## 2010-12-03 LAB — CBC WITH DIFFERENTIAL/PLATELET
Basophils Absolute: 0 10*3/uL (ref 0.0–0.1)
EOS%: 1 % (ref 0.0–7.0)
Eosinophils Absolute: 0.1 10*3/uL (ref 0.0–0.5)
HCT: 39.1 % (ref 34.8–46.6)
HGB: 13.8 g/dL (ref 11.6–15.9)
MCH: 32.7 pg (ref 25.1–34.0)
MONO#: 0.4 10*3/uL (ref 0.1–0.9)
NEUT#: 5.1 10*3/uL (ref 1.5–6.5)
NEUT%: 68.7 % (ref 38.4–76.8)
RDW: 11.8 % (ref 11.2–14.5)
lymph#: 1.9 10*3/uL (ref 0.9–3.3)

## 2010-12-03 LAB — COMPREHENSIVE METABOLIC PANEL
BUN: 16 mg/dL (ref 6–23)
CO2: 18 mEq/L — ABNORMAL LOW (ref 19–32)
Creatinine, Ser: 0.99 mg/dL (ref 0.50–1.10)
Glucose, Bld: 113 mg/dL — ABNORMAL HIGH (ref 70–99)
Total Bilirubin: 0.3 mg/dL (ref 0.3–1.2)
Total Protein: 6.6 g/dL (ref 6.0–8.3)

## 2010-12-03 LAB — CANCER ANTIGEN 27.29: CA 27.29: 25 U/mL (ref 0–39)

## 2010-12-03 NOTE — Patient Instructions (Signed)
May shower tomorrow F/U in 1-2 weeks

## 2010-12-03 NOTE — Progress Notes (Signed)
Subjective:     Patient ID: Crystal [name], female   DOB: 10/20/1975, 35 y.o.   MRN: [id number]  HPI The patient is a 35 year old white female who is in about 2 weeks out from bilateral mastectomies and a right axillary node dissection. Her postoperative course was complicated by some cellulitis of the right chest wall. This is resolved on doxycycline. She has only one drain remained in straining minimal amounts of serous fluid. The cellulitis has completely resolved. She's feeling much better and has no complaints today  Review of Systems     Objective:   Physical Exam On exam her mastectomy incisions are both healing up very nicely. There is no evidence of cellulitis. No evidence of seroma formation. Her last drain was removed today and she tolerated this well.    Assessment:     2 weeks postop from bilateral mastectomies and right axillary lymph node dissection.    Plan:     At this point the patient can shower tomorrow morning. We'll plan to see her back in about a week to check for any further seroma formation. She agrees to call sutures and problems in the meantime.

## 2010-12-08 ENCOUNTER — Ambulatory Visit (HOSPITAL_COMMUNITY)
Admission: RE | Admit: 2010-12-08 | Discharge: 2010-12-08 | Disposition: A | Discharge status: Home / Self Care | Payer: Self-pay | Source: Ambulatory Visit | Attending: Oncology | Admitting: Oncology

## 2010-12-08 DIAGNOSIS — R55 Syncope and collapse: Secondary | ICD-10-CM | POA: Insufficient documentation

## 2010-12-08 DIAGNOSIS — G44009 Cluster headache syndrome, unspecified, not intractable: Secondary | ICD-10-CM

## 2010-12-08 DIAGNOSIS — C50919 Malignant neoplasm of unspecified site of unspecified female breast: Secondary | ICD-10-CM | POA: Insufficient documentation

## 2010-12-08 DIAGNOSIS — R110 Nausea: Secondary | ICD-10-CM | POA: Insufficient documentation

## 2010-12-08 MED ORDER — GADOBENATE DIMEGLUMINE 529 MG/ML IV SOLN
15.0000 mL | Freq: Once | INTRAVENOUS | Status: AC | PRN
  Administered 2010-12-08: 15 mL via INTRAVENOUS

## 2010-12-09 ENCOUNTER — Ambulatory Visit (INDEPENDENT_AMBULATORY_CARE_PROVIDER_SITE_OTHER): Payer: Self-pay | Admitting: General Surgery

## 2010-12-09 ENCOUNTER — Encounter (INDEPENDENT_AMBULATORY_CARE_PROVIDER_SITE_OTHER): Payer: Self-pay | Admitting: General Surgery

## 2010-12-09 DIAGNOSIS — C50919 Malignant neoplasm of unspecified site of unspecified female breast: Secondary | ICD-10-CM

## 2010-12-09 NOTE — Patient Instructions (Signed)
Ok to attend after mastectomy physical therapy class F/U in 1 month

## 2010-12-13 ENCOUNTER — Encounter (INDEPENDENT_AMBULATORY_CARE_PROVIDER_SITE_OTHER): Payer: Self-pay | Admitting: General Surgery

## 2010-12-13 NOTE — Progress Notes (Signed)
Subjective:     Patient ID: Crystal [name], female   DOB: 09-May-1975, 35 y.o.   MRN: [id number]  HPI The patient is a 35 year old white female who is now 3 weeks out from bilateral mastectomies and a right axillary lymph node dissection. Most of her pain has resolved. She is at no further redness of her chest wall.  Review of Systems     Objective:   Physical Exam On exam both mastectomy incisions are healing nicely. There is no evidence of seroma formation. Assessment:     2-3 weeks postop from bilateral mastectomies and right axillary lymph node dissection.    Plan:     At this point she's doing well. She will continue to follow up with her medical and radiation oncologist. We will refer for after mastectomy physical therapy class. We was planned to see her back in one month

## 2010-12-20 ENCOUNTER — Encounter (HOSPITAL_BASED_OUTPATIENT_CLINIC_OR_DEPARTMENT_OTHER): Payer: Self-pay | Admitting: Oncology

## 2010-12-20 ENCOUNTER — Other Ambulatory Visit: Payer: Self-pay | Admitting: Oncology

## 2010-12-20 DIAGNOSIS — C50419 Malignant neoplasm of upper-outer quadrant of unspecified female breast: Secondary | ICD-10-CM

## 2010-12-20 DIAGNOSIS — Z5111 Encounter for antineoplastic chemotherapy: Secondary | ICD-10-CM

## 2010-12-20 DIAGNOSIS — Z5112 Encounter for antineoplastic immunotherapy: Secondary | ICD-10-CM

## 2010-12-20 LAB — CBC WITH DIFFERENTIAL/PLATELET
BASO%: 0.1 % (ref 0.0–2.0)
HCT: 40.7 % (ref 34.8–46.6)
MCHC: 35.5 g/dL (ref 31.5–36.0)
MONO#: 0.1 10*3/uL (ref 0.1–0.9)
NEUT#: 14.1 10*3/uL — ABNORMAL HIGH (ref 1.5–6.5)
NEUT%: 94.8 % — ABNORMAL HIGH (ref 38.4–76.8)
WBC: 14.9 10*3/uL — ABNORMAL HIGH (ref 3.9–10.3)
lymph#: 0.7 10*3/uL — ABNORMAL LOW (ref 0.9–3.3)

## 2010-12-21 ENCOUNTER — Encounter (HOSPITAL_BASED_OUTPATIENT_CLINIC_OR_DEPARTMENT_OTHER): Payer: Self-pay | Admitting: Oncology

## 2010-12-21 ENCOUNTER — Telehealth: Payer: Self-pay

## 2010-12-21 DIAGNOSIS — C50419 Malignant neoplasm of upper-outer quadrant of unspecified female breast: Secondary | ICD-10-CM

## 2010-12-21 NOTE — Telephone Encounter (Signed)
Called pt and left message to return our call to the clinics.  

## 2010-12-21 NOTE — Telephone Encounter (Signed)
Message copied by Faythe Casa on Tue Dec 21, 2010 11:31 AM ------      Message from: Delena Bali      Created: Tue Dec 21, 2010  9:30 AM       Please notify patient that her [place] and Pap were both normal. She should continue with plans to work with her PCP regarding other sources of her pelvic/abdominal pain. Thanks!

## 2010-12-24 NOTE — Telephone Encounter (Signed)
Called pt. I informed her of Dr. Vira Blanco recommendation and test results.  Pt states that she had already talked with [place] and was given the same information. Pt also asked about endometriosis and how one would know if they has it. I said that would diagnosed by ultrasound or laparoscopy. I reaffirmed that Dr. Natale Milch stated that her [place] was WNL. Pt will discuss with PCP amd call back if she needs furhter clinic appts.

## 2010-12-27 ENCOUNTER — Other Ambulatory Visit: Payer: Self-pay | Admitting: Oncology

## 2010-12-27 ENCOUNTER — Encounter (HOSPITAL_BASED_OUTPATIENT_CLINIC_OR_DEPARTMENT_OTHER): Payer: Self-pay | Admitting: Oncology

## 2010-12-27 DIAGNOSIS — C50419 Malignant neoplasm of upper-outer quadrant of unspecified female breast: Secondary | ICD-10-CM

## 2010-12-27 LAB — CBC WITH DIFFERENTIAL/PLATELET
Basophils Absolute: 0.1 10*3/uL (ref 0.0–0.1)
HCT: 36.5 % (ref 34.8–46.6)
HGB: 13.4 g/dL (ref 11.6–15.9)
MCH: 31.8 pg (ref 25.1–34.0)
MONO#: 1.3 10*3/uL — ABNORMAL HIGH (ref 0.1–0.9)
NEUT%: 40.5 % (ref 38.4–76.8)
WBC: 6.6 10*3/uL (ref 3.9–10.3)
lymph#: 2.5 10*3/uL (ref 0.9–3.3)

## 2011-01-04 ENCOUNTER — Other Ambulatory Visit: Payer: Self-pay | Admitting: Oncology

## 2011-01-04 ENCOUNTER — Encounter (HOSPITAL_BASED_OUTPATIENT_CLINIC_OR_DEPARTMENT_OTHER): Payer: Self-pay | Admitting: Oncology

## 2011-01-04 DIAGNOSIS — C50419 Malignant neoplasm of upper-outer quadrant of unspecified female breast: Secondary | ICD-10-CM

## 2011-01-04 LAB — CBC WITH DIFFERENTIAL/PLATELET
BASO%: 0.2 % (ref 0.0–2.0)
Basophils Absolute: 0 10*3/uL (ref 0.0–0.1)
EOS%: 0 % (ref 0.0–7.0)
HCT: 35.2 % (ref 34.8–46.6)
HGB: 12.4 g/dL (ref 11.6–15.9)
LYMPH%: 24.3 % (ref 14.0–49.7)
MCH: 31.4 pg (ref 25.1–34.0)
MCHC: 35.2 g/dL (ref 31.5–36.0)
MCV: 89.1 fL (ref 79.5–101.0)
MONO%: 4.5 % (ref 0.0–14.0)
NEUT%: 71 % (ref 38.4–76.8)
Platelets: 148 10*3/uL (ref 145–400)

## 2011-01-10 ENCOUNTER — Other Ambulatory Visit: Payer: Self-pay | Admitting: Oncology

## 2011-01-10 ENCOUNTER — Encounter (INDEPENDENT_AMBULATORY_CARE_PROVIDER_SITE_OTHER): Payer: Self-pay | Admitting: General Surgery

## 2011-01-10 ENCOUNTER — Encounter (HOSPITAL_BASED_OUTPATIENT_CLINIC_OR_DEPARTMENT_OTHER): Payer: Self-pay | Admitting: Oncology

## 2011-01-10 DIAGNOSIS — Z5111 Encounter for antineoplastic chemotherapy: Secondary | ICD-10-CM

## 2011-01-10 DIAGNOSIS — C50419 Malignant neoplasm of upper-outer quadrant of unspecified female breast: Secondary | ICD-10-CM

## 2011-01-10 LAB — CBC WITH DIFFERENTIAL/PLATELET
BASO%: 0.1 % (ref 0.0–2.0)
Basophils Absolute: 0 10*3/uL (ref 0.0–0.1)
MCHC: 35.3 g/dL (ref 31.5–36.0)
MONO#: 0.2 10*3/uL (ref 0.1–0.9)
NEUT%: 92.9 % — ABNORMAL HIGH (ref 38.4–76.8)
Platelets: 249 10*3/uL (ref 145–400)
lymph#: 0.7 10*3/uL — ABNORMAL LOW (ref 0.9–3.3)

## 2011-01-10 LAB — COMPREHENSIVE METABOLIC PANEL
ALT: 23 U/L (ref 0–35)
CO2: 22 mEq/L (ref 19–32)
Calcium: 9.9 mg/dL (ref 8.4–10.5)
Chloride: 108 mEq/L (ref 96–112)
Creatinine, Ser: 0.99 mg/dL (ref 0.50–1.10)

## 2011-01-11 ENCOUNTER — Encounter (HOSPITAL_BASED_OUTPATIENT_CLINIC_OR_DEPARTMENT_OTHER): Payer: Self-pay | Admitting: Oncology

## 2011-01-11 DIAGNOSIS — Z5189 Encounter for other specified aftercare: Secondary | ICD-10-CM

## 2011-01-11 DIAGNOSIS — C50419 Malignant neoplasm of upper-outer quadrant of unspecified female breast: Secondary | ICD-10-CM

## 2011-01-17 ENCOUNTER — Other Ambulatory Visit: Payer: Self-pay | Admitting: Oncology

## 2011-01-17 ENCOUNTER — Encounter (HOSPITAL_BASED_OUTPATIENT_CLINIC_OR_DEPARTMENT_OTHER): Payer: Self-pay | Admitting: Oncology

## 2011-01-17 DIAGNOSIS — Z9010 Acquired absence of unspecified breast and nipple: Secondary | ICD-10-CM

## 2011-01-17 DIAGNOSIS — C50919 Malignant neoplasm of unspecified site of unspecified female breast: Secondary | ICD-10-CM

## 2011-01-17 DIAGNOSIS — Z79899 Other long term (current) drug therapy: Secondary | ICD-10-CM

## 2011-01-17 DIAGNOSIS — R197 Diarrhea, unspecified: Secondary | ICD-10-CM

## 2011-01-17 DIAGNOSIS — C50419 Malignant neoplasm of upper-outer quadrant of unspecified female breast: Secondary | ICD-10-CM

## 2011-01-17 LAB — CBC WITH DIFFERENTIAL/PLATELET
BASO%: 0.9 % (ref 0.0–2.0)
HCT: 34.7 % — ABNORMAL LOW (ref 34.8–46.6)
MCHC: 35.7 g/dL (ref 31.5–36.0)
MONO#: 0.6 10*3/uL (ref 0.1–0.9)
NEUT%: 37.2 % — ABNORMAL LOW (ref 38.4–76.8)
RBC: 3.9 10*6/uL (ref 3.70–5.45)
RDW: 13.5 % (ref 11.2–14.5)
WBC: 3.2 10*3/uL — ABNORMAL LOW (ref 3.9–10.3)
lymph#: 1.4 10*3/uL (ref 0.9–3.3)
nRBC: 0 % (ref 0–0)

## 2011-01-20 LAB — URINALYSIS, ROUTINE W REFLEX MICROSCOPIC
Hgb urine dipstick: NEGATIVE
Nitrite: NEGATIVE
Specific Gravity, Urine: 1.027
Urobilinogen, UA: 0.2

## 2011-01-20 LAB — COMPREHENSIVE METABOLIC PANEL
BUN: 9
CO2: 28
Chloride: 100
Creatinine, Ser: 0.83
GFR calc non Af Amer: >60
Glucose, Bld: 116 — ABNORMAL HIGH
Total Bilirubin: 0.5

## 2011-01-20 LAB — BASIC METABOLIC PANEL
GFR calc Af Amer: >60
GFR calc non Af Amer: >60
Potassium: 3.7
Sodium: 138

## 2011-01-20 LAB — CBC
HCT: 36.2
HCT: 42.8
Hemoglobin: 12.6
MCV: 92
RBC: 4.65
WBC: 5.2
WBC: 7.6

## 2011-01-20 LAB — PHENYTOIN LEVEL, TOTAL: Phenytoin Lvl: 9.9 — ABNORMAL LOW

## 2011-01-20 LAB — DIFFERENTIAL
Basophils Absolute: 0
Eosinophils Relative: 1
Lymphocytes Relative: 14
Neutro Abs: 6.2
Neutrophils Relative %: 82 — ABNORMAL HIGH

## 2011-01-28 LAB — POCT I-STAT, CHEM 8
BUN: 11
Calcium, Ion: 1.25
Creatinine, Ser: 1.1
Glucose, Bld: 94
TCO2: 27

## 2011-01-28 LAB — URINALYSIS, ROUTINE W REFLEX MICROSCOPIC
Hgb urine dipstick: NEGATIVE
Protein, ur: NEGATIVE
Urobilinogen, UA: 0.2

## 2011-01-28 LAB — URINE CULTURE

## 2011-01-28 LAB — GC/CHLAMYDIA PROBE AMP, GENITAL: Chlamydia, DNA Probe: NEGATIVE

## 2011-01-28 LAB — WET PREP, GENITAL: Trich, Wet Prep: NONE SEEN

## 2011-01-31 ENCOUNTER — Encounter (HOSPITAL_BASED_OUTPATIENT_CLINIC_OR_DEPARTMENT_OTHER): Payer: Self-pay | Admitting: Oncology

## 2011-01-31 ENCOUNTER — Other Ambulatory Visit: Payer: Self-pay | Admitting: Oncology

## 2011-01-31 DIAGNOSIS — Z5111 Encounter for antineoplastic chemotherapy: Secondary | ICD-10-CM

## 2011-01-31 DIAGNOSIS — Z5112 Encounter for antineoplastic immunotherapy: Secondary | ICD-10-CM

## 2011-01-31 DIAGNOSIS — C50419 Malignant neoplasm of upper-outer quadrant of unspecified female breast: Secondary | ICD-10-CM

## 2011-01-31 LAB — CBC WITH DIFFERENTIAL/PLATELET
BASO%: 0 % (ref 0.0–2.0)
LYMPH%: 10.9 % — ABNORMAL LOW (ref 14.0–49.7)
MCH: 31.9 pg (ref 25.1–34.0)
MCHC: 34.9 g/dL (ref 31.5–36.0)
MCV: 91.3 fL (ref 79.5–101.0)
MONO%: 4.8 % (ref 0.0–14.0)
Platelets: 136 10*3/uL — ABNORMAL LOW (ref 145–400)
RBC: 3.45 10*6/uL — ABNORMAL LOW (ref 3.70–5.45)
WBC: 9.3 10*3/uL (ref 3.9–10.3)

## 2011-01-31 LAB — COMPREHENSIVE METABOLIC PANEL
ALT: 13 U/L (ref 0–35)
Alkaline Phosphatase: 58 U/L (ref 39–117)
Creatinine, Ser: 0.91 mg/dL (ref 0.50–1.10)
Sodium: 142 mEq/L (ref 135–145)
Total Bilirubin: 0.4 mg/dL (ref 0.3–1.2)
Total Protein: 6.2 g/dL (ref 6.0–8.3)

## 2011-02-01 ENCOUNTER — Encounter (HOSPITAL_BASED_OUTPATIENT_CLINIC_OR_DEPARTMENT_OTHER): Payer: Self-pay | Admitting: Oncology

## 2011-02-01 DIAGNOSIS — C50419 Malignant neoplasm of upper-outer quadrant of unspecified female breast: Secondary | ICD-10-CM

## 2011-02-04 LAB — URINALYSIS, MICROSCOPIC ONLY
Glucose, UA: NEGATIVE
Ketones, ur: NEGATIVE
Leukocytes, UA: NEGATIVE
Specific Gravity, Urine: 1.011
Urine-Other: NONE SEEN
pH: 7.5

## 2011-02-04 LAB — COMPREHENSIVE METABOLIC PANEL
ALT: 29
AST: 16 U/L (ref 0–37)
Albumin: 3.6
Alkaline Phosphatase: 64
BUN: 8 mg/dL (ref 6–23)
BUN: 5 — ABNORMAL LOW
CO2: 26 mEq/L (ref 19–32)
CO2: 28
Calcium: 8.6 mg/dL (ref 8.4–10.5)
Calcium: 8.4
Creatinine, Ser: 0.76 mg/dL (ref 0.4–1.2)
Creatinine, Ser: 0.71
Creatinine, Ser: 0.82
GFR calc Af Amer: >60 mL/min (ref >60)
GFR calc non Af Amer: >60 mL/min (ref >60)
GFR calc non Af Amer: >60
Glucose, Bld: 110 — ABNORMAL HIGH
Glucose, Bld: 84
Potassium: 3.9
Total Bilirubin: 0.4 mg/dL (ref 0.3–1.2)
Total Bilirubin: 0.8
Total Protein: 5.5 — ABNORMAL LOW

## 2011-02-04 LAB — DIFFERENTIAL
Basophils Absolute: 0.1 10*3/uL (ref 0.0–0.1)
Basophils Absolute: 0
Basophils Relative: 0
Eosinophils Absolute: 0
Eosinophils Absolute: 0.1 — ABNORMAL LOW
Eosinophils Relative: 3 % (ref 0–5)
Lymphocytes Relative: 14 % (ref 12–46)
Lymphs Abs: 1.5 10*3/uL (ref 0.7–4.0)
Lymphs Abs: 0.7
Monocytes Absolute: 0.3
Monocytes Relative: 7
Neutro Abs: 7.8 10*3/uL — ABNORMAL HIGH (ref 1.7–7.7)
Neutrophils Relative %: 89 — ABNORMAL HIGH

## 2011-02-04 LAB — CBC
HCT: 40.8 % (ref 36.0–46.0)
HCT: 36.7
Hemoglobin: 13.1
Hemoglobin: 13.8
MCHC: 34.6 g/dL (ref 30.0–36.0)
MCHC: 35
MCV: 95 fL (ref 78.0–100.0)
MCV: 92
MCV: 92.1
Platelets: 150 10*3/uL (ref 150–400)
Platelets: 186
RBC: 4.3 MIL/uL (ref 3.87–5.11)
RBC: 4.29
RDW: 12.6

## 2011-02-04 LAB — LIPASE, BLOOD
Lipase: 14 U/L (ref 11–59)
Lipase: 16

## 2011-02-04 LAB — URINALYSIS, ROUTINE W REFLEX MICROSCOPIC
Nitrite: NEGATIVE
Specific Gravity, Urine: 1.024 (ref 1.005–1.030)
Urobilinogen, UA: 1 mg/dL (ref 0.0–1.0)
pH: 6 (ref 5.0–8.0)

## 2011-02-04 LAB — PHENYTOIN LEVEL, TOTAL: Phenytoin Lvl: 10.2

## 2011-02-04 LAB — RAPID URINE DRUG SCREEN, HOSP PERFORMED
Barbiturates: NOT DETECTED
Cocaine: NOT DETECTED
Opiates: NOT DETECTED

## 2011-02-04 LAB — URINE MICROSCOPIC-ADD ON

## 2011-02-04 LAB — URINE CULTURE: Special Requests: NEGATIVE

## 2011-02-07 ENCOUNTER — Encounter (HOSPITAL_BASED_OUTPATIENT_CLINIC_OR_DEPARTMENT_OTHER): Payer: Self-pay | Admitting: Oncology

## 2011-02-07 ENCOUNTER — Other Ambulatory Visit: Payer: Self-pay | Admitting: Oncology

## 2011-02-07 DIAGNOSIS — C50419 Malignant neoplasm of upper-outer quadrant of unspecified female breast: Secondary | ICD-10-CM

## 2011-02-07 LAB — CBC WITH DIFFERENTIAL/PLATELET
BASO%: 0.5 % (ref 0.0–2.0)
Basophils Absolute: 0 10*3/uL (ref 0.0–0.1)
EOS%: 0.8 % (ref 0.0–7.0)
HCT: 29.1 % — ABNORMAL LOW (ref 34.8–46.6)
HGB: 10.2 g/dL — ABNORMAL LOW (ref 11.6–15.9)
LYMPH%: 36.9 % (ref 14.0–49.7)
MCH: 31.8 pg (ref 25.1–34.0)
MCHC: 35.1 g/dL (ref 31.5–36.0)
MCV: 90.7 fL (ref 79.5–101.0)
NEUT%: 36.7 % — ABNORMAL LOW (ref 38.4–76.8)
Platelets: 80 10*3/uL — ABNORMAL LOW (ref 145–400)

## 2011-02-09 ENCOUNTER — Encounter: Payer: Self-pay | Admitting: *Deleted

## 2011-02-11 LAB — URINALYSIS, ROUTINE W REFLEX MICROSCOPIC
Bilirubin Urine: NEGATIVE
Glucose, UA: NEGATIVE
Glucose, UA: NEGATIVE
Hgb urine dipstick: NEGATIVE
Leukocytes, UA: NEGATIVE
Protein, ur: NEGATIVE
Protein, ur: NEGATIVE
Urobilinogen, UA: 0.2
pH: 6.5

## 2011-02-11 LAB — I-STAT 8, (EC8 V) (CONVERTED LAB)
Acid-base deficit: 2
BUN: 11
Chloride: 104
HCT: 47 — ABNORMAL HIGH
Hemoglobin: 16 — ABNORMAL HIGH
Operator id: 234501
Potassium: 3.7
Sodium: 140

## 2011-02-11 LAB — POCT CARDIAC MARKERS
CKMB, poc: <1 — ABNORMAL LOW
Troponin i, poc: <0.05

## 2011-02-11 LAB — HEPATIC FUNCTION PANEL
ALT: 17
AST: 23
Albumin: 4.4
Total Protein: 6.9

## 2011-02-11 LAB — PREGNANCY, URINE: Preg Test, Ur: NEGATIVE

## 2011-02-11 LAB — DIFFERENTIAL
Eosinophils Absolute: 0
Eosinophils Relative: 0
Lymphocytes Relative: 15
Lymphs Abs: 1.1
Monocytes Absolute: 0.3

## 2011-02-11 LAB — CBC
Hemoglobin: 15.4 — ABNORMAL HIGH
MCHC: 35.1
Platelets: 180
RDW: 12.3

## 2011-02-11 LAB — POCT I-STAT CREATININE
Creatinine, Ser: 1.3 — ABNORMAL HIGH
Operator id: 234501

## 2011-02-11 LAB — RAPID URINE DRUG SCREEN, HOSP PERFORMED
Cocaine: NOT DETECTED
Tetrahydrocannabinol: NOT DETECTED

## 2011-02-11 LAB — D-DIMER, QUANTITATIVE: D-Dimer, Quant: <0.22

## 2011-02-11 LAB — URINE MICROSCOPIC-ADD ON

## 2011-02-15 ENCOUNTER — Encounter (INDEPENDENT_AMBULATORY_CARE_PROVIDER_SITE_OTHER): Payer: Self-pay | Admitting: General Surgery

## 2011-02-19 ENCOUNTER — Other Ambulatory Visit: Payer: Self-pay | Admitting: Physician Assistant

## 2011-02-20 ENCOUNTER — Other Ambulatory Visit: Payer: Self-pay | Admitting: Physician Assistant

## 2011-02-20 DIAGNOSIS — C50919 Malignant neoplasm of unspecified site of unspecified female breast: Secondary | ICD-10-CM

## 2011-02-21 ENCOUNTER — Other Ambulatory Visit: Payer: Self-pay | Admitting: Oncology

## 2011-02-21 ENCOUNTER — Encounter (HOSPITAL_BASED_OUTPATIENT_CLINIC_OR_DEPARTMENT_OTHER): Payer: Self-pay | Admitting: Oncology

## 2011-02-21 DIAGNOSIS — Z5111 Encounter for antineoplastic chemotherapy: Secondary | ICD-10-CM

## 2011-02-21 DIAGNOSIS — Z170 Estrogen receptor positive status [ER+]: Secondary | ICD-10-CM

## 2011-02-21 DIAGNOSIS — C50419 Malignant neoplasm of upper-outer quadrant of unspecified female breast: Secondary | ICD-10-CM

## 2011-02-21 LAB — CBC WITH DIFFERENTIAL/PLATELET
Basophils Absolute: 0 10*3/uL (ref 0.0–0.1)
Eosinophils Absolute: 0 10*3/uL (ref 0.0–0.5)
HCT: 31.6 % — ABNORMAL LOW (ref 34.8–46.6)
HGB: 10.9 g/dL — ABNORMAL LOW (ref 11.6–15.9)
LYMPH%: 5 % — ABNORMAL LOW (ref 14.0–49.7)
MCV: 95.5 fL (ref 79.5–101.0)
MONO#: 0.4 10*3/uL (ref 0.1–0.9)
MONO%: 3.6 % (ref 0.0–14.0)
NEUT#: 9.3 10*3/uL — ABNORMAL HIGH (ref 1.5–6.5)
NEUT%: 91.3 % — ABNORMAL HIGH (ref 38.4–76.8)
Platelets: 165 10*3/uL (ref 145–400)
RBC: 3.31 10*6/uL — ABNORMAL LOW (ref 3.70–5.45)
WBC: 10.2 10*3/uL (ref 3.9–10.3)
nRBC: 0 % (ref 0–0)

## 2011-02-21 LAB — COMPREHENSIVE METABOLIC PANEL
ALT: 104 U/L — ABNORMAL HIGH (ref 0–35)
CO2: 22 mEq/L (ref 19–32)
Calcium: 9.4 mg/dL (ref 8.4–10.5)
Chloride: 105 mEq/L (ref 96–112)
Creatinine, Ser: 0.86 mg/dL (ref 0.50–1.10)
Glucose, Bld: 103 mg/dL — ABNORMAL HIGH (ref 70–99)
Total Bilirubin: 0.6 mg/dL (ref 0.3–1.2)

## 2011-02-22 ENCOUNTER — Encounter (HOSPITAL_BASED_OUTPATIENT_CLINIC_OR_DEPARTMENT_OTHER): Payer: Self-pay | Admitting: Oncology

## 2011-02-22 DIAGNOSIS — C50419 Malignant neoplasm of upper-outer quadrant of unspecified female breast: Secondary | ICD-10-CM

## 2011-02-22 DIAGNOSIS — Z5189 Encounter for other specified aftercare: Secondary | ICD-10-CM

## 2011-02-28 ENCOUNTER — Other Ambulatory Visit: Payer: Self-pay | Admitting: Oncology

## 2011-02-28 ENCOUNTER — Ambulatory Visit (INDEPENDENT_AMBULATORY_CARE_PROVIDER_SITE_OTHER): Payer: Self-pay | Admitting: General Surgery

## 2011-02-28 ENCOUNTER — Encounter (INDEPENDENT_AMBULATORY_CARE_PROVIDER_SITE_OTHER): Payer: Self-pay | Admitting: General Surgery

## 2011-02-28 VITALS — BP 120/76 | HR 66 | Temp 98.1°F | Resp 14 | Ht 64.0 in | Wt 133.6 lb

## 2011-02-28 DIAGNOSIS — C50919 Malignant neoplasm of unspecified site of unspecified female breast: Secondary | ICD-10-CM

## 2011-02-28 NOTE — Progress Notes (Signed)
Subjective:     Patient ID: Crystal [name], female   DOB: [birth year], 35 y.o.   MRN: [id number]  HPI The patient is a 35 year old white female who is now about 4 months out from bilateral mastectomies and a right axillary node dissection for a T1C N1 a right breast cancer. She has 2 more chemotherapy treatments to go. She seems to be tolerating the chemotherapy well. Her only complaint is of some soreness in the right axilla.  Review of Systems  Constitutional: Negative.   HENT: Negative.   Eyes: Negative.   Respiratory: Negative.   Cardiovascular: Negative.   Gastrointestinal: Negative.   Genitourinary: Negative.   Musculoskeletal: Negative.   Skin: Negative.   Neurological: Negative.   Hematological: Negative.   Psychiatric/Behavioral: Negative.        Objective:   Physical Exam  Constitutional: She is oriented to person, place, and time. She appears well-developed and well-nourished.  HENT:  Head: Normocephalic and atraumatic.  Eyes: Conjunctivae and EOM are normal. Pupils are equal, round, and reactive to light.  Neck: Normal range of motion. Neck supple.  Cardiovascular: Normal rate, regular rhythm and normal heart sounds.   Pulmonary/Chest: Effort normal and breath sounds normal.       No palpable mass of either chest wall. No axillary supraclavicular or cervical lymphadenopathy.  Abdominal: Soft. Bowel sounds are normal.  Musculoskeletal: Normal range of motion.  Neurological: She is alert and oriented to person, place, and time.  Skin: Skin is warm and dry.  Psychiatric: She has a normal mood and affect. Her behavior is normal.       Assessment:     4 months out from bilateral mastectomies and right axillary node dissection    Plan:     Overall she seems to be doing very well. She has 2 more chemotherapy treatments to go. It is still undecided whether she'll get radiation therapy. She will then get tamoxifen therapy for 5 years. We will plan to see her back in  about 3 months.

## 2011-02-28 NOTE — Patient Instructions (Signed)
Continue regular self exams  

## 2011-03-01 ENCOUNTER — Encounter (HOSPITAL_BASED_OUTPATIENT_CLINIC_OR_DEPARTMENT_OTHER): Payer: Self-pay | Admitting: Oncology

## 2011-03-01 ENCOUNTER — Other Ambulatory Visit: Payer: Self-pay | Admitting: Oncology

## 2011-03-01 DIAGNOSIS — C50419 Malignant neoplasm of upper-outer quadrant of unspecified female breast: Secondary | ICD-10-CM

## 2011-03-01 DIAGNOSIS — Z5189 Encounter for other specified aftercare: Secondary | ICD-10-CM

## 2011-03-01 LAB — CBC WITH DIFFERENTIAL/PLATELET
BASO%: 0 % (ref 0.0–2.0)
Eosinophils Absolute: 0 10*3/uL (ref 0.0–0.5)
HCT: 34.4 % — ABNORMAL LOW (ref 34.8–46.6)
MCHC: 34.6 g/dL (ref 31.5–36.0)
MONO#: 1.2 10*3/uL — ABNORMAL HIGH (ref 0.1–0.9)
NEUT#: 5.4 10*3/uL (ref 1.5–6.5)
RBC: 3.56 10*6/uL — ABNORMAL LOW (ref 3.70–5.45)
WBC: 11 10*3/uL — ABNORMAL HIGH (ref 3.9–10.3)
lymph#: 4.4 10*3/uL — ABNORMAL HIGH (ref 0.9–3.3)
nRBC: 0 % (ref 0–0)

## 2011-03-03 ENCOUNTER — Ambulatory Visit (HOSPITAL_COMMUNITY)
Admission: RE | Admit: 2011-03-03 | Discharge: 2011-03-03 | Disposition: A | Discharge status: Home / Self Care | Payer: Self-pay | Source: Ambulatory Visit | Attending: Oncology | Admitting: Oncology

## 2011-03-03 DIAGNOSIS — R55 Syncope and collapse: Secondary | ICD-10-CM | POA: Insufficient documentation

## 2011-03-03 DIAGNOSIS — C50919 Malignant neoplasm of unspecified site of unspecified female breast: Secondary | ICD-10-CM | POA: Insufficient documentation

## 2011-03-03 DIAGNOSIS — Z09 Encounter for follow-up examination after completed treatment for conditions other than malignant neoplasm: Secondary | ICD-10-CM

## 2011-03-03 DIAGNOSIS — G40909 Epilepsy, unspecified, not intractable, without status epilepticus: Secondary | ICD-10-CM | POA: Insufficient documentation

## 2011-03-03 DIAGNOSIS — Z79899 Other long term (current) drug therapy: Secondary | ICD-10-CM | POA: Insufficient documentation

## 2011-03-03 DIAGNOSIS — Z87891 Personal history of nicotine dependence: Secondary | ICD-10-CM | POA: Insufficient documentation

## 2011-03-10 ENCOUNTER — Telehealth: Payer: Self-pay | Admitting: *Deleted

## 2011-03-10 NOTE — Telephone Encounter (Signed)
PT. CALLED. SHE HAS HAD A HEADACHE FOR THREE DAYS. PT. HAS HAD NAUSEA FOR SEVERAL DAYS AND ZOFRAN IS NOT HELPING. SHE CAN ONLY TAKE THE PHENERGAN AT NIGHT. PT. HAS THROBBING, INTERMITTED, BONE PAIN IN HER LEFT ARM AND THE TRAMADOL IS NOT HELPING. SHE HAS THESE PROBLEMS THE WEEK BEFORE HER NEXT CHEMO TREATMENT BUT THIS WEEK SHE IS MORE UNCOMFORTABLE. SUGGESTIONS? THIS NOTE TO AMY BERRY,PA.

## 2011-03-10 NOTE — Telephone Encounter (Signed)
INSTRUCTIONS FROM AMY BERRY,PA WERE GIVEN TO PT EXCEPT AMY REALIZED PT. DID NOT HAVE COMPAZINE BUT INSTEAD THE PT. HAD PHENERGAN SO PT. WAS INFORMED TO CONTINUE PHENERGAN. PT. VOICES UNDERSTANDING OF ALL INSTRUCTIONS.

## 2011-03-10 NOTE — Telephone Encounter (Signed)
Message copied by Arvilla Meres on Thu Mar 10, 2011  1:58 PM ------      Message from: [place], Virginia G      Created: Thu Mar 10, 2011  1:18 PM      Regarding: response to patient's call      Contact: [phone]       Patient is prob taking Ultracet (tramadol with Tylenol).  Continue and add Aleve, 1 PO BID with food.              Take Benadryl, 25mg  PO x 1 for headache which is likely due to Zofran.            Discontinue Zofran, but continue Compazine for nausea.  Also, add Decadron, 4mg  PO BID x 2 days.            Can take Xanax for anxiety. May help with other issues as well. (Rx from another MD)            Call tomorrow if no relief.

## 2011-03-14 ENCOUNTER — Telehealth: Payer: Self-pay | Admitting: Oncology

## 2011-03-14 ENCOUNTER — Ambulatory Visit (HOSPITAL_BASED_OUTPATIENT_CLINIC_OR_DEPARTMENT_OTHER): Payer: Self-pay | Admitting: Oncology

## 2011-03-14 ENCOUNTER — Other Ambulatory Visit (HOSPITAL_BASED_OUTPATIENT_CLINIC_OR_DEPARTMENT_OTHER): Payer: Self-pay | Admitting: Lab

## 2011-03-14 ENCOUNTER — Ambulatory Visit (HOSPITAL_BASED_OUTPATIENT_CLINIC_OR_DEPARTMENT_OTHER): Payer: Self-pay

## 2011-03-14 ENCOUNTER — Other Ambulatory Visit: Payer: Self-pay

## 2011-03-14 VITALS — BP 129/80 | HR 112 | Temp 97.8°F | Ht 64.5 in | Wt 141.7 lb

## 2011-03-14 DIAGNOSIS — C50419 Malignant neoplasm of upper-outer quadrant of unspecified female breast: Secondary | ICD-10-CM

## 2011-03-14 DIAGNOSIS — H538 Other visual disturbances: Secondary | ICD-10-CM

## 2011-03-14 DIAGNOSIS — Z5111 Encounter for antineoplastic chemotherapy: Secondary | ICD-10-CM

## 2011-03-14 DIAGNOSIS — C50919 Malignant neoplasm of unspecified site of unspecified female breast: Secondary | ICD-10-CM

## 2011-03-14 LAB — COMPREHENSIVE METABOLIC PANEL
Albumin: 3.9 g/dL (ref 3.5–5.2)
Alkaline Phosphatase: 68 U/L (ref 39–117)
CO2: 24 mEq/L (ref 19–32)
Calcium: 9.2 mg/dL (ref 8.4–10.5)
Chloride: 106 mEq/L (ref 96–112)
Glucose, Bld: 115 mg/dL — ABNORMAL HIGH (ref 70–99)
Potassium: 3.8 mEq/L (ref 3.5–5.3)
Sodium: 139 mEq/L (ref 135–145)
Total Protein: 5.7 g/dL — ABNORMAL LOW (ref 6.0–8.3)

## 2011-03-14 LAB — CBC WITH DIFFERENTIAL/PLATELET
Basophils Absolute: 0 10*3/uL (ref 0.0–0.1)
Eosinophils Absolute: 0 10*3/uL (ref 0.0–0.5)
HCT: 29.3 % — ABNORMAL LOW (ref 34.8–46.6)
HGB: 10 g/dL — ABNORMAL LOW (ref 11.6–15.9)
MONO#: 0.2 10*3/uL (ref 0.1–0.9)
NEUT#: 9.5 10*3/uL — ABNORMAL HIGH (ref 1.5–6.5)
RDW: 17.4 % — ABNORMAL HIGH (ref 11.2–14.5)
lymph#: 0.8 10*3/uL — ABNORMAL LOW (ref 0.9–3.3)

## 2011-03-14 MED ORDER — SODIUM CHLORIDE 0.9 % IV SOLN
Freq: Once | INTRAVENOUS | Status: AC
  Administered 2011-03-14: 11:00:00 via INTRAVENOUS

## 2011-03-14 MED ORDER — DOCETAXEL CHEMO INJECTION 160 MG/16ML
75.0000 mg/m2 | Freq: Once | INTRAVENOUS | Status: AC
  Administered 2011-03-14: 130 mg via INTRAVENOUS
  Filled 2011-03-14: qty 13

## 2011-03-14 MED ORDER — HEPARIN SOD (PORK) LOCK FLUSH 100 UNIT/ML IV SOLN
500.0000 [IU] | Freq: Once | INTRAVENOUS | Status: AC | PRN
  Administered 2011-03-14: 500 [IU]
  Filled 2011-03-14: qty 5

## 2011-03-14 MED ORDER — TRASTUZUMAB CHEMO INJECTION 440 MG
6.0000 mg/kg | Freq: Once | INTRAVENOUS | Status: AC
  Administered 2011-03-14: 378 mg via INTRAVENOUS
  Filled 2011-03-14: qty 18

## 2011-03-14 MED ORDER — DIPHENHYDRAMINE HCL 25 MG PO CAPS
25.0000 mg | ORAL_CAPSULE | Freq: Once | ORAL | Status: AC
  Administered 2011-03-14: 25 mg via ORAL

## 2011-03-14 MED ORDER — DEXAMETHASONE SODIUM PHOSPHATE 4 MG/ML IJ SOLN
20.0000 mg | Freq: Once | INTRAMUSCULAR | Status: AC
  Administered 2011-03-14: 20 mg via INTRAVENOUS

## 2011-03-14 MED ORDER — SODIUM CHLORIDE 0.9 % IV SOLN
660.0000 mg | Freq: Once | INTRAVENOUS | Status: AC
  Administered 2011-03-14: 660 mg via INTRAVENOUS
  Filled 2011-03-14: qty 66

## 2011-03-14 MED ORDER — ONDANSETRON 16 MG/50ML IVPB (CHCC)
16.0000 mg | Freq: Once | INTRAVENOUS | Status: AC
  Administered 2011-03-14: 16 mg via INTRAVENOUS

## 2011-03-14 MED ORDER — ACETAMINOPHEN 325 MG PO TABS
650.0000 mg | ORAL_TABLET | Freq: Once | ORAL | Status: AC
  Administered 2011-03-14: 650 mg via ORAL

## 2011-03-14 MED ORDER — SODIUM CHLORIDE 0.9 % IJ SOLN
10.0000 mL | INTRAMUSCULAR | Status: DC | PRN
  Administered 2011-03-14: 10 mL
  Filled 2011-03-14: qty 10

## 2011-03-14 MED ORDER — OXYCODONE-ACETAMINOPHEN 5-325 MG PO TABS
1.0000 | ORAL_TABLET | Freq: Once | ORAL | Status: AC
  Administered 2011-03-14: 1 via ORAL

## 2011-03-14 NOTE — Progress Notes (Signed)
ID: Crystal [name]  CC:  Chief Complaint  Patient presents with  . Breast Cancer    Interval History/ ROS: Grover Canavan returns today for followup of her breast cancer. She did very well with cycle #4. She does feel fatigued and she said some aches and pains she is concerned that she may be having neuropathy but what she actually describes is some discomfort at the base of both palms and some "burning feelings" in the thighs. These have resolved. She certainly has some joint pains which she had originally she complains a little bit of blurred vision and feels she needs "stronger eyedrops" she feels a little bit short of breath when she walks up stairs and some heartburn problems and continues to have some feelings of anxiety forgetfulness occasional problems with headaches. She has not had a period this month and is beginning to have some hot flashes. Otherwise detailed review of systems was noncontributory     Medications: I have reviewed the patient's current medications.  Prior to Admission:  Medications Prior to Admission  Medication Sig Dispense Refill  . ALPRAZolam (XANAX) 0.5 MG tablet Take 0.5 mg by mouth 3 (three) times daily as needed.        . famotidine (PEPCID) 10 MG tablet Take 10 mg by mouth daily.        Marland Kitchen ibuprofen (ADVIL,MOTRIN) 200 MG tablet Take 200 mg by mouth every 6 (six) hours as needed.        . lidocaine-prilocaine (EMLA) cream Apply topically as needed.        . ondansetron (ZOFRAN) 8 MG tablet Take 8 mg by mouth every 8 (eight) hours as needed.        . promethazine (PHENERGAN) 25 MG tablet Take 25 mg by mouth every 6 (six) hours as needed.        . traMADol (ULTRAM) 50 MG tablet Take 50 mg by mouth every 6 (six) hours as needed.         No current facility-administered medications on file as of 03/14/2011.     Objective: Vital signs in last 24 hours: BP 129/80  Pulse 112  Temp(Src) 97.8 F (36.6 C) (Oral)  Ht 5' 4.5" (1.638 m)  Wt 141 lb 11.2 oz (64.275  kg)  BMI 23.95 kg/m2   Physical Exam:    Sclerae unicteric  Oropharynx clear  No peripheral adenopathy  Lungs clear -- no rales or rhonchi  Heart regular rate and rhythm  Abdomen benign  MSK no focal spinal tenderness, no peripheral edema  Neuro nonfocal  Breast exam: Status post bilateral mastectomies no evidence of local recurrence on the right no suspicious findings on the left  Lab Results:   BMET    Component Value Date/Time   NA 140 02/21/2011 0918   K 3.6 02/21/2011 0918   CL 105 02/21/2011 0918   CO2 22 02/21/2011 0918   GLUCOSE 103* 02/21/2011 0918   BUN 21 02/21/2011 0918   CREATININE 0.86 02/21/2011 0918   CALCIUM 9.4 02/21/2011 0918   GFRNONAA >60 11/16/2010 0856   GFRAA >60 11/16/2010 0856     CMP     Component Value Date/Time   NA 140 02/21/2011 0918   K 3.6 02/21/2011 0918   CL 105 02/21/2011 0918   CO2 22 02/21/2011 0918   GLUCOSE 103* 02/21/2011 0918   BUN 21 02/21/2011 0918   CREATININE 0.86 02/21/2011 0918   CALCIUM 9.4 02/21/2011 0918   PROT 6.5 02/21/2011 0918   ALBUMIN  4.7 02/21/2011 0918   AST 86* 02/21/2011 0918   ALT 104* 02/21/2011 0918   ALKPHOS 52 02/21/2011 0918   BILITOT 0.6 02/21/2011 0918   GFRNONAA >60 11/16/2010 0856   GFRAA >60 11/16/2010 0856    CBC    Component Value Date/Time   WBC 10.2 11/05/2010 1449   RBC 4.59 11/05/2010 1449   HGB 10.0* 03/14/2011 0841   HGB 15.3* 11/05/2010 1449   HCT 29.3* 03/14/2011 0841   HCT 41.7 11/05/2010 1449   PLT 154 03/14/2011 0841   PLT 230 11/05/2010 1449   MCV 101.0 03/14/2011 0841   MCV 90.8 11/05/2010 1449   MCH 33.3 11/05/2010 1449   MCHC 36.7* 11/05/2010 1449   RDW 11.8 11/05/2010 1449   LYMPHSABS 2.6 06/24/2010 0005   MONOABS 0.4 06/24/2010 0005   EOSABS 0.0 03/14/2011 0841   EOSABS 0.1 06/24/2010 0005   BASOSABS 0.0 03/14/2011 0841   BASOSABS 0.0 06/24/2010 0005        Studies/Results: Echocardiogram obtained November 1 shows no drop in her ejection fraction which remains at  60-65%  Assessment: 35 year old [place] woman, known to be BRCA1 and 2 negative, status post bilateral mastectomies May of 2012 for a right-sided T1C N1 grade 3 invasive ductal carcinoma, triple positive, with an MIB-1-1 of 85%, being treated with 6 cycles of TAC chemotherapy   Plan: Today she will receive cycle 5 of the same chemotherapy with no changes in doses she will return to see [place] in one week to troubleshoot side effects and I have written her a prescription for ZY LEEP to see if that helps her eyes a little better but this is very expensive and if she cannot get it she will simply refill her TobraDex. I have made her an appointment to see Dr. Michell Heinrich sometime late December or early January to begin to discuss postmastectomy radiation overall I am delighted that she is doing this well with no evidence of end organ or permanence damage. She knows to call for any problems that may develop before the next visit.  [name],[name] 03/14/2011

## 2011-03-14 NOTE — Telephone Encounter (Signed)
per Dr. [name] scheduled pt for appt with Dr. Michell Heinrich on 12/12.  G appt to pt in chemo room

## 2011-03-14 NOTE — Patient Instructions (Signed)
Take anti-nausea meds as needed.

## 2011-03-15 ENCOUNTER — Encounter: Payer: Self-pay | Admitting: Oncology

## 2011-03-15 ENCOUNTER — Ambulatory Visit (HOSPITAL_BASED_OUTPATIENT_CLINIC_OR_DEPARTMENT_OTHER): Payer: Self-pay

## 2011-03-15 VITALS — BP 127/84 | HR 79 | Temp 97.0°F

## 2011-03-15 DIAGNOSIS — C50919 Malignant neoplasm of unspecified site of unspecified female breast: Secondary | ICD-10-CM

## 2011-03-15 DIAGNOSIS — C50419 Malignant neoplasm of upper-outer quadrant of unspecified female breast: Secondary | ICD-10-CM

## 2011-03-15 MED ORDER — PEGFILGRASTIM INJECTION 6 MG/0.6ML
6.0000 mg | Freq: Once | SUBCUTANEOUS | Status: AC
  Administered 2011-03-15: 6 mg via SUBCUTANEOUS
  Filled 2011-03-15: qty 0.6

## 2011-03-15 NOTE — Progress Notes (Deleted)
ID: Crystal [name]   Interval History: ***  ROS: {Ros-general:12137}  Medications: I have reviewed the patient's current medications.   Current Outpatient Prescriptions  Medication Sig Dispense Refill  . ALPRAZolam (XANAX) 0.5 MG tablet Take 0.5 mg by mouth 3 (three) times daily as needed.        . famotidine (PEPCID) 10 MG tablet Take 10 mg by mouth daily.        Marland Kitchen ibuprofen (ADVIL,MOTRIN) 200 MG tablet Take 200 mg by mouth every 6 (six) hours as needed.        . lidocaine-prilocaine (EMLA) cream Apply topically as needed.        . ondansetron (ZOFRAN) 8 MG tablet Take 8 mg by mouth every 8 (eight) hours as needed.        . promethazine (PHENERGAN) 25 MG tablet Take 25 mg by mouth every 6 (six) hours as needed.        . traMADol (ULTRAM) 50 MG tablet Take 50 mg by mouth every 6 (six) hours as needed.         No current facility-administered medications for this visit.   Facility-Administered Medications Ordered in Other Visits  Medication Dose Route Frequency Provider Last Rate Last Dose  . 0.9 %  sodium chloride infusion   Intravenous Once Lowella Dell, MD      . acetaminophen (TYLENOL) tablet 650 mg  650 mg Oral Once Lowella Dell, MD   650 mg at 03/14/11 1100  . CARBOplatin (PARAPLATIN) 660 mg in sodium chloride 0.9 % 250 mL chemo infusion  660 mg Intravenous Once Lowella Dell, MD   660 mg at 03/14/11 1253  . dexamethasone (DECADRON) injection 20 mg  20 mg Intravenous Once Lowella Dell, MD   20 mg at 03/14/11 1050  . diphenhydrAMINE (BENADRYL) capsule 25 mg  25 mg Oral Once Lowella Dell, MD   25 mg at 03/14/11 1100  . DOCEtaxel (TAXOTERE) 130 mg in dextrose 5 % 250 mL chemo infusion  75 mg/m2 (Treatment Plan Actual) Intravenous Once Lowella Dell, MD   130 mg at 03/14/11 1152  . heparin lock flush 100 unit/mL  500 Units Intracatheter Once PRN Lowella Dell, MD   500 Units at 03/14/11 1425  . ondansetron (ZOFRAN) IVPB 16 mg  16 mg Intravenous Once  Lowella Dell, MD   16 mg at 03/14/11 1050  . oxyCODONE-acetaminophen (PERCOCET) 5-325 MG per tablet 1 tablet  1 tablet Oral Once Lowella Dell, MD   1 tablet at 03/14/11 1225  . pegfilgrastim (NEULASTA) injection 6 mg  6 mg Subcutaneous Once Lowella Dell, MD   6 mg at 03/15/11 0820  . trastuzumab (HERCEPTIN) 378 mg in sodium chloride 0.9 % 250 mL chemo infusion  6 mg/kg (Treatment Plan Actual) Intravenous Once Lowella Dell, MD   378 mg at 03/14/11 1337  . DISCONTD: sodium chloride 0.9 % injection 10 mL  10 mL Intracatheter PRN Lowella Dell, MD   10 mL at 03/14/11 1425     Objective: Vital signs in last 24 hours: There were no vitals taken for this visit.   Physical Exam:  {physical exam:21449}  Sclerae unicteric  Oropharynx clear  No peripheral adenopathy  Lungs clear -- no rales or rhonchi  Heart regular rate and rhythm  Abdomen benign  MSK no focal spinal tenderness, no peripheral edema  Neuro nonfocal  Breast exam: ***  Lab Results:   BMET  Component Value Date/Time   NA 139 03/14/2011 0841   K 3.8 03/14/2011 0841   CL 106 03/14/2011 0841   CO2 24 03/14/2011 0841   GLUCOSE 115* 03/14/2011 0841   BUN 19 03/14/2011 0841   CREATININE 0.88 03/14/2011 0841   CALCIUM 9.2 03/14/2011 0841   GFRNONAA >60 11/16/2010 0856   GFRAA >60 11/16/2010 0856     CMP     Component Value Date/Time   NA 139 03/14/2011 0841   K 3.8 03/14/2011 0841   CL 106 03/14/2011 0841   CO2 24 03/14/2011 0841   GLUCOSE 115* 03/14/2011 0841   BUN 19 03/14/2011 0841   CREATININE 0.88 03/14/2011 0841   CALCIUM 9.2 03/14/2011 0841   PROT 5.7* 03/14/2011 0841   ALBUMIN 3.9 03/14/2011 0841   AST 15 03/14/2011 0841   ALT 28 03/14/2011 0841   ALKPHOS 68 03/14/2011 0841   BILITOT 0.2* 03/14/2011 0841   GFRNONAA >60 11/16/2010 0856   GFRAA >60 11/16/2010 0856    CBC    Component Value Date/Time   WBC 10.2 11/05/2010 1449   RBC 4.59 11/05/2010 1449   HGB 10.0* 03/14/2011  0841   HGB 15.3* 11/05/2010 1449   HCT 29.3* 03/14/2011 0841   HCT 41.7 11/05/2010 1449   PLT 154 03/14/2011 0841   PLT 230 11/05/2010 1449   MCV 101.0 03/14/2011 0841   MCV 90.8 11/05/2010 1449   MCH 33.3 11/05/2010 1449   MCHC 36.7* 11/05/2010 1449   RDW 11.8 11/05/2010 1449   LYMPHSABS 2.6 06/24/2010 0005   MONOABS 0.4 06/24/2010 0005   EOSABS 0.0 03/14/2011 0841   EOSABS 0.1 06/24/2010 0005   BASOSABS 0.0 03/14/2011 0841   BASOSABS 0.0 06/24/2010 0005        Studies/Results: No results found.  Assessment: ***   Plan: ***  Crystal [name] 03/15/2011

## 2011-03-16 ENCOUNTER — Telehealth: Payer: Self-pay | Admitting: Oncology

## 2011-03-18 ENCOUNTER — Encounter: Payer: Self-pay | Admitting: *Deleted

## 2011-03-18 NOTE — Progress Notes (Signed)
This encounter was created in error - please disregard.

## 2011-03-21 ENCOUNTER — Ambulatory Visit (HOSPITAL_BASED_OUTPATIENT_CLINIC_OR_DEPARTMENT_OTHER): Payer: Self-pay | Admitting: Oncology

## 2011-03-21 ENCOUNTER — Other Ambulatory Visit (HOSPITAL_BASED_OUTPATIENT_CLINIC_OR_DEPARTMENT_OTHER): Payer: Self-pay | Admitting: Lab

## 2011-03-21 ENCOUNTER — Other Ambulatory Visit: Payer: Self-pay | Admitting: Oncology

## 2011-03-21 VITALS — BP 141/92 | HR 103 | Temp 98.3°F | Ht 64.0 in | Wt 132.8 lb

## 2011-03-21 DIAGNOSIS — Z170 Estrogen receptor positive status [ER+]: Secondary | ICD-10-CM

## 2011-03-21 DIAGNOSIS — C50419 Malignant neoplasm of upper-outer quadrant of unspecified female breast: Secondary | ICD-10-CM

## 2011-03-21 DIAGNOSIS — R5381 Other malaise: Secondary | ICD-10-CM

## 2011-03-21 DIAGNOSIS — C50919 Malignant neoplasm of unspecified site of unspecified female breast: Secondary | ICD-10-CM

## 2011-03-21 DIAGNOSIS — R079 Chest pain, unspecified: Secondary | ICD-10-CM

## 2011-03-21 LAB — CBC WITH DIFFERENTIAL/PLATELET
Eosinophils Absolute: 0 10*3/uL (ref 0.0–0.5)
MCV: 99.7 fL (ref 79.5–101.0)
MONO%: 12.3 % (ref 0.0–14.0)
NEUT#: 4.7 10*3/uL (ref 1.5–6.5)
RBC: 3.24 10*6/uL — ABNORMAL LOW (ref 3.70–5.45)
RDW: 15.8 % — ABNORMAL HIGH (ref 11.2–14.5)
WBC: 6.2 10*3/uL (ref 3.9–10.3)

## 2011-03-21 NOTE — Progress Notes (Signed)
ID: Crystal [name]   Interval History: Crystal [name] returns today for followup of her breast cancer. This is day 8 of her fifth cycle of chemotherapy. She is looking forward to finishing, and has good plans for the Thanksgiving holiday. She tells me altogether discord has been a blessing", she has become very spiritual," it's rubbing off on my husband as well". She feels she will do better on tramadol than her current pain medicine and that is discussed below.  ROS: She has some stabbing pains in her left chest particularly but also throughout her back these occur after treatment likely secondary to the Neulasta. She feels weak sometimes it's hard for her to drive. She's had more of a dry cough this last visit she's not constipated denies unusual headaches or visual changes difficulty swallowing or pain on swallowing, there has been no significant nausea and certainly no vomiting. She has not had a period since September. Otherwise a detailed review of systems was stable  Medications: I have reviewed the patient's current medications.   Current Outpatient Prescriptions  Medication Sig Dispense Refill  . dexamethasone (DECADRON) 4 MG tablet Take 8 mg by mouth 2 (two) times daily with a meal. As directed       . ALPRAZolam (XANAX) 0.5 MG tablet Take 0.5 mg by mouth 3 (three) times daily as needed.        . famotidine (PEPCID) 20 MG tablet Take 20 mg by mouth 2 (two) times daily.        Marland Kitchen lidocaine-prilocaine (EMLA) cream Apply topically as needed.        . naproxen sodium (ANAPROX) 220 MG tablet Take 220 mg by mouth 2 (two) times daily with a meal.        . promethazine (PHENERGAN) 25 MG tablet Take 25 mg by mouth every 6 (six) hours as needed.        . traMADol (ULTRAM) 50 MG tablet Take 50 mg by mouth every 6 (six) hours as needed.           Objective: Vital signs in last 24 hours: BP 141/92  Pulse 103  Temp(Src) 98.3 F (36.8 C) (Oral)  Ht 5\' 4"  (1.626 m)  Wt 132 lb 12.8 oz (60.238 kg)   BMI 22.80 kg/m2   Physical Exam:    Sclerae unicteric  Oropharynx clear  No peripheral adenopathy  Lungs clear -- no rales or rhonchi  Heart regular rate and rhythm  Abdomen benign  MSK no focal spinal tenderness, no peripheral edema  Neuro nonfocal  Breast exam: She status post bilateral mastectomies there is no evidence of local recurrence  Lab Results:   CMP  Lab Results  Component Value Date   GLUCOSE 115* 03/14/2011   ALT 28 03/14/2011   AST 15 03/14/2011   NA 139 03/14/2011   K 3.8 03/14/2011   CL 106 03/14/2011   CREATININE 0.88 03/14/2011   BUN 19 03/14/2011   CO2 24 03/14/2011   TSH  Value: 4.066  07/21/2009   INR 1.57* 03/01/2009     Lab Results  Component Value Date   WBC 6.2 03/21/2011   HGB 11.4* 03/21/2011   HCT 32.3* 03/21/2011   MCV 99.7 03/21/2011   PLT 133* 03/21/2011        Studies/Results: No new results found. Most recent echocardiogram was November 2012.  Assessment:  35 year old [place] woman, known to be BRCA1 and 2 negative, status post bilateral mastectomies May of 2012 for a right-sided T1c N1 (stage  II) grade 3 invasive ductal carcinoma,estrogen and progesterone receptor positive, HER-2 amplified, with an MIB-1-1 of 85%.   Plan: She will return December 3 for her sixth and final cycle of chemotherapy. Of course we will continue the Herceptin 2 total one year. She will need her next echocardiogram in February and we will set her up with Dr. Bethanne Ginger around that time.  Today I she went off the Endocet and I wrote for tramadol 50 mg for her to take up to 4 times a day. I gave her a 120 at tablet prescription with refills. She will let me know if this is adequate for pain control.  Bonnell Placzek C 03/21/2011

## 2011-03-27 ENCOUNTER — Inpatient Hospital Stay (HOSPITAL_COMMUNITY)
Admission: EM | Admit: 2011-03-27 | Discharge: 2011-03-30 | DRG: 603 | Disposition: A | Discharge status: Home Health | Payer: Self-pay | Attending: Internal Medicine | Admitting: Internal Medicine

## 2011-03-27 ENCOUNTER — Encounter (HOSPITAL_COMMUNITY): Payer: Self-pay

## 2011-03-27 DIAGNOSIS — L02219 Cutaneous abscess of trunk, unspecified: Principal | ICD-10-CM | POA: Diagnosis present

## 2011-03-27 DIAGNOSIS — Z9221 Personal history of antineoplastic chemotherapy: Secondary | ICD-10-CM

## 2011-03-27 DIAGNOSIS — L02519 Cutaneous abscess of unspecified hand: Secondary | ICD-10-CM | POA: Diagnosis present

## 2011-03-27 DIAGNOSIS — L0390 Cellulitis, unspecified: Secondary | ICD-10-CM | POA: Diagnosis present

## 2011-03-27 DIAGNOSIS — T451X5A Adverse effect of antineoplastic and immunosuppressive drugs, initial encounter: Secondary | ICD-10-CM | POA: Diagnosis present

## 2011-03-27 DIAGNOSIS — R569 Unspecified convulsions: Secondary | ICD-10-CM | POA: Diagnosis present

## 2011-03-27 DIAGNOSIS — E876 Hypokalemia: Secondary | ICD-10-CM | POA: Diagnosis present

## 2011-03-27 DIAGNOSIS — D6481 Anemia due to antineoplastic chemotherapy: Secondary | ICD-10-CM | POA: Diagnosis present

## 2011-03-27 DIAGNOSIS — Z9010 Acquired absence of unspecified breast and nipple: Secondary | ICD-10-CM

## 2011-03-27 DIAGNOSIS — A4902 Methicillin resistant Staphylococcus aureus infection, unspecified site: Secondary | ICD-10-CM | POA: Diagnosis present

## 2011-03-27 DIAGNOSIS — L989 Disorder of the skin and subcutaneous tissue, unspecified: Secondary | ICD-10-CM | POA: Diagnosis present

## 2011-03-27 DIAGNOSIS — L0291 Cutaneous abscess, unspecified: Secondary | ICD-10-CM | POA: Diagnosis present

## 2011-03-27 DIAGNOSIS — C50919 Malignant neoplasm of unspecified site of unspecified female breast: Secondary | ICD-10-CM | POA: Diagnosis present

## 2011-03-27 DIAGNOSIS — G40909 Epilepsy, unspecified, not intractable, without status epilepticus: Secondary | ICD-10-CM | POA: Diagnosis present

## 2011-03-27 LAB — BASIC METABOLIC PANEL
BUN: 9 mg/dL (ref 6–23)
Chloride: 101 mEq/L (ref 96–112)
GFR calc Af Amer: >90 mL/min (ref >90)
GFR calc non Af Amer: >90 mL/min (ref >90)
Potassium: 3.1 mEq/L — ABNORMAL LOW (ref 3.5–5.1)
Sodium: 136 mEq/L (ref 135–145)

## 2011-03-27 LAB — CBC
MCV: 100 fL (ref 78.0–100.0)
Platelets: 132 10*3/uL — ABNORMAL LOW (ref 150–400)
RBC: 2.69 MIL/uL — ABNORMAL LOW (ref 3.87–5.11)
WBC: 10.8 10*3/uL — ABNORMAL HIGH (ref 4.0–10.5)

## 2011-03-27 LAB — DIFFERENTIAL
Basophils Absolute: 0 10*3/uL (ref 0.0–0.1)
Eosinophils Absolute: 0 10*3/uL (ref 0.0–0.7)
Lymphocytes Relative: 11 % — ABNORMAL LOW (ref 12–46)
Monocytes Relative: 5 % (ref 3–12)
Neutrophils Relative %: 84 % — ABNORMAL HIGH (ref 43–77)

## 2011-03-27 LAB — LACTIC ACID, PLASMA: Lactic Acid, Venous: 0.7 mmol/L (ref 0.5–2.2)

## 2011-03-27 MED ORDER — HYDROMORPHONE HCL PF 2 MG/ML IJ SOLN
INTRAMUSCULAR | Status: AC
  Administered 2011-03-27: 1 mg via INTRAVENOUS
  Filled 2011-03-27: qty 1

## 2011-03-27 MED ORDER — HYDROMORPHONE HCL PF 2 MG/ML IJ SOLN
2.0000 mg | Freq: Once | INTRAMUSCULAR | Status: DC
  Administered 2011-03-27: 1 mg via INTRAVENOUS

## 2011-03-27 MED ORDER — ONDANSETRON HCL 4 MG/2ML IJ SOLN
4.0000 mg | Freq: Once | INTRAMUSCULAR | Status: AC
  Administered 2011-03-27: 4 mg via INTRAVENOUS
  Filled 2011-03-27: qty 2

## 2011-03-27 MED ORDER — HYDROMORPHONE HCL PF 1 MG/ML IJ SOLN
1.0000 mg | Freq: Once | INTRAMUSCULAR | Status: AC
  Administered 2011-03-30: 1 mg via INTRAVENOUS
  Filled 2011-03-27: qty 1

## 2011-03-27 MED ORDER — HYDROMORPHONE HCL PF 1 MG/ML IJ SOLN: 1.0000 mg | Freq: Once | INTRAMUSCULAR | Status: DC

## 2011-03-27 MED ORDER — SODIUM CHLORIDE 0.9 % IV SOLN
Freq: Once | INTRAVENOUS | Status: AC
  Administered 2011-03-27: 19:00:00 via INTRAVENOUS

## 2011-03-27 MED ORDER — LIDOCAINE HCL 1 % IJ SOLN
INTRAMUSCULAR | Status: AC
  Administered 2011-03-27: 20:00:00
  Filled 2011-03-27: qty 20

## 2011-03-27 MED ORDER — MORPHINE SULFATE 4 MG/ML IJ SOLN
4.0000 mg | Freq: Once | INTRAMUSCULAR | Status: AC
  Administered 2011-03-27: 4 mg via INTRAVENOUS
  Filled 2011-03-27: qty 1

## 2011-03-27 MED ORDER — VANCOMYCIN HCL IN DEXTROSE 1-5 GM/200ML-% IV SOLN
1000.0000 mg | Freq: Once | INTRAVENOUS | Status: AC
  Administered 2011-03-27: 1000 mg via INTRAVENOUS
  Filled 2011-03-27: qty 200

## 2011-03-27 MED ORDER — DEXTROSE 5 % IV SOLN
1.0000 g | Freq: Once | INTRAVENOUS | Status: AC
  Administered 2011-03-27: 1 g via INTRAVENOUS
  Filled 2011-03-27: qty 10

## 2011-03-27 NOTE — ED Notes (Signed)
Dr Dayna Ramus wants once of the 2 sets of blood cultures from the central line port in the left chest.  Ladona Ridgel rn drawing that set off the port.

## 2011-03-27 NOTE — ED Provider Notes (Signed)
History     CSN: [id number] Arrival date & time: 03/27/2011  4:39 PM   First MD Initiated Contact with Patient 03/27/11 1730      Chief Complaint  Patient presents with  . Rash    (Consider location/radiation/quality/duration/timing/severity/associated sxs/prior treatment) Patient is a 35 y.o. female presenting with rash. The history is provided by the patient.  Rash  This is a new problem. The current episode started 2 days ago. Progression since onset: It started with a painful swelling over left eye. Now has become similar painful lesions over her body, the worse of which is LLQ abdominal wall with surrounding redness. The problem is associated with nothing (She has a history of breast CA with recent bilateral mastectomy, current chemotherapy with port in left chest.  No fever.).    Past Medical History  Diagnosis Date  . Seizures     last seizure 2 years ago, diagnosed 3 years ago  . Allergy   . rt breast ca dx'd 10/2010  . Breast cancer 12/03/10    Past Surgical History  Procedure Date  . Wisdom tooth extraction   . Breast surgery 11/16/2010    bilateral mastectomy+Ralnd,T1cN1a, Her2+,ERPR+    Family History  Problem Relation Age of Onset  . Cancer Maternal Grandfather     History  Substance Use Topics  . Smoking status: Former Smoker    Types: Cigarettes    Quit date: 11/01/2010  . Smokeless tobacco: Never Used  . Alcohol Use: No     quit drinking 2 months ago    OB History    Grav Para Term Preterm Abortions TAB SAB Ect Mult Living   1 1 1  0 0 0 0 0 0 0      Review of Systems  Constitutional: Negative for fever and chills.  HENT: Negative.   Respiratory: Negative.   Cardiovascular: Negative.   Gastrointestinal: Negative.  Negative for nausea.  Musculoskeletal: Negative.   Skin: Positive for rash.       C/O Abscess multiple locations.  Neurological: Negative.     Allergies  Phenytoin; BJY:NWGNFAOZHYQ+MVHQIONGE+XBMWUXLKGM acid+aspartame;  Thorazine; Augmentin; and Ciprofloxacin  Home Medications   Current Outpatient Rx  Name Route Sig Dispense Refill  . ALPRAZOLAM 0.5 MG PO TABS Oral Take 0.5 mg by mouth 3 (three) times daily as needed. anxiety    . DEXAMETHASONE 4 MG PO TABS Oral Take 8 mg by mouth 2 (two) times daily with a meal. . Pt will start the regimen of dexamethasone back on 04-03-11.    Marland Kitchen FAMOTIDINE 20 MG PO TABS Oral Take 20 mg by mouth 2 (two) times daily.     Marland Kitchen LIDOCAINE-PRILOCAINE 2.5-2.5 % EX CREA Topical Apply topically as needed. Apply to port as needed    . POLYETHYLENE GLYCOL 3350 PO PACK Oral Take 17 g by mouth daily. constipation     . PROMETHAZINE HCL 25 MG PO TABS Oral Take 25 mg by mouth every 6 (six) hours as needed. nausea    . TOBRAMYCIN-DEXAMETHASONE 0.3-0.1 % OP OINT Both Eyes Place 1 application into both eyes 2 (two) times daily. Pt uses after chemo for blurred vision     . TRAMADOL HCL 50 MG PO TABS Oral Take 50 mg by mouth every 6 (six) hours as needed. pain      BP 112/71  Pulse 133  Temp(Src) 98.5 F (36.9 C) (Oral)  Resp 16  SpO2 96%  Physical Exam  Constitutional: She appears well-developed and well-nourished.  HENT:  Head: Normocephalic.  Neck: Normal range of motion. Neck supple.  Cardiovascular: Normal rate and regular rhythm.   Pulmonary/Chest: Effort normal and breath sounds normal.  Abdominal: Soft. Bowel sounds are normal. There is no tenderness. There is no rebound and no guarding.       Left lower abdomen with draining abscess and large surrounding cellulitis.   Musculoskeletal: Normal range of motion.       There is a large abscess on left wrist that appears to be in need of I&D. There are several smaller abscesses to thighs and face that are not drainable currently.   Neurological: She is alert. No cranial nerve deficit.  Skin: Skin is warm and dry. No rash noted.  Psychiatric: She has a normal mood and affect.    ED Course  Procedures (including critical care  time)  Labs Reviewed  CBC - Abnormal; Notable for the following:    WBC 10.8 (*)    RBC 2.69 (*)    Hemoglobin 9.4 (*)    HCT 26.9 (*)    MCH 34.9 (*)    Platelets 132 (*)    All other components within normal limits  DIFFERENTIAL - Abnormal; Notable for the following:    Neutrophils Relative 84 (*)    Lymphocytes Relative 11 (*)    Neutro Abs 9.1 (*)    All other components within normal limits  BASIC METABOLIC PANEL - Abnormal; Notable for the following:    Potassium 3.1 (*)    All other components within normal limits  CULTURE, BLOOD (ROUTINE X 2)  CULTURE, BLOOD (ROUTINE X 2)  LACTIC ACID, PLASMA   No results found.   1. MRSA (methicillin resistant Staphylococcus aureus)       MDM  See Dr. Juleen China 's not for I&D note and bedside ultrasound opinion. The abscess on left wrist has become larger and more erythematous since patient's arrival. There is also increased redness surrounding the abdominal and left thigh areas.  Patient admitted to [facility].         Rodena Medin, PA 03/27/11 2019

## 2011-03-27 NOTE — H&P (Signed)
PCP:   [name], MD  Oncologists; Dr. Jetta Lout Radiation oncologist; Dr. Doreen Beam  Chief Complaint:  Cellulitis  HPI: This is an unfortunate 35 year old female who was diagnosed with right-sided breast cancer June 2012, she had bilateral mastectomy in July 2012 and has been on chemotherapy since August 2012. She appears to be tolerating every 3 weekly chemotherapy well. On Monday she developed a small pimple on the left brow, she picked it and it started bleeding. On Tuesday she developed another on the right brow, since then she's had these pustulous pimples developing all over her body. She is developing significant cellulitis her on some. The rapidity of the developing cellulitis is increasing. She reports no fevers or chills, no nausea no vomiting. She came to the ER. In the ER cellulitic area on the left abdomen and the left wrist were I&D and cultured. Patient has no history of prior skin infections. History obtained from patient and husband.  Review of Systems: Positives bolded The patient denies anorexia, fever, weight loss,, vision loss, decreased hearing, hoarseness, chest pain, syncope, dyspnea on exertion, peripheral edema, balance deficits, hemoptysis, abdominal pain, melena, hematochezia, severe indigestion/heartburn, hematuria, incontinence, genital sores, muscle weakness, suspicious skin lesions, transient blindness, difficulty walking, depression, unusual weight change, abnormal bleeding, enlarged lymph nodes, angioedema, and breast masses.  Past Medical History: Past Medical History  Diagnosis Date  . Seizures     last seizure 2 years ago, diagnosed 3 years ago  . Allergy   . rt breast ca dx'd 10/2010  . Breast cancer 12/03/10   Past Surgical History  Procedure Date  . Wisdom tooth extraction   . Breast surgery 11/16/2010    bilateral mastectomy+Ralnd,T1cN1a, Her2+,ERPR+    Medications: Prior to Admission medications   Medication Sig Start Date End Date Taking?  Authorizing Provider  ALPRAZolam Prudy Feeler) 0.5 MG tablet Take 0.5 mg by mouth 3 (three) times daily as needed. anxiety   Yes Historical Provider, MD  dexamethasone (DECADRON) 4 MG tablet Take 8 mg by mouth 2 (two) times daily with a meal. . Pt will start the regimen of dexamethasone back on 04-03-11.   Yes Historical Provider, MD  famotidine (PEPCID) 20 MG tablet Take 20 mg by mouth 2 (two) times daily.    Yes Historical Provider, MD  lidocaine-prilocaine (EMLA) cream Apply topically as needed. Apply to port as needed 12/03/10  Yes Historical Provider, MD  polyethylene glycol (MIRALAX / GLYCOLAX) packet Take 17 g by mouth daily. constipation    Yes Historical Provider, MD  promethazine (PHENERGAN) 25 MG tablet Take 25 mg by mouth every 6 (six) hours as needed. nausea   Yes Historical Provider, MD  tobramycin-dexamethasone (TOBRADEX) ophthalmic ointment Place 1 application into both eyes 2 (two) times daily. Pt uses after chemo for blurred vision    Yes Historical Provider, MD  traMADol (ULTRAM) 50 MG tablet Take 50 mg by mouth every 6 (six) hours as needed. pain 12/23/10  Yes Historical Provider, MD    Allergies:   Allergies  Allergen Reactions  . Phenytoin Nausea And Vomiting  . EAV:WUJWJXBJYNW+GNFAOZHYQ+MVHQIONGEX Acid+Aspartame Other (See Comments)    faint  . Thorazine (Chlorpromazine Hcl) Hives  . Augmentin Other (See Comments)    Syncope  . Ciprofloxacin Nausea And Vomiting    Social History:  reports that she quit smoking about 4 months ago. Her smoking use included Cigarettes. She has never used smokeless tobacco. She reports that she does not drink alcohol or use illicit drugs.  Family History: Family History  Problem Relation Age of Onset  . Cancer Maternal Grandfather     Physical Exam: Filed Vitals:   03/27/11 1716  BP: 112/71  Pulse: 133  Temp: 98.5 F (36.9 C)  TempSrc: Oral  Resp: 16  SpO2: 96%    General:  Alert and oriented times three, well developed and  nourished, no acute distress Eyes: PERRLA, pink conjunctiva, no scleral icterus ENT: Moist oral mucosa, neck supple, no thyromegaly Lungs: clear to ascultation, no wheeze, no crackles, no use of accessory muscles Cardiovascular: regular rate and rhythm, no regurgitation, no gallops, no murmurs. No carotid bruits, no JVD Abdomen: soft, positive BS, non-tender, non-distended, no organomegaly, not an acute abdomen GU: not examined Neuro: CN II - XII grossly intact, sensation intact Musculoskeletal: strength 5/5 all extremities, no clubbing, cyanosis or edema, port in the left chest wall Skin: no rash, no subcutaneous crepitation, no decubitus, cellulitic areas scattered on body with small pustular pimples. Largest area on the left lower quadrant of the abdomen and left wrists, well healed mastectomy scar Psych: appropriate patient   Labs on Admission:   [facility] 03/27/11 1825  NA 136  K 3.1*  CL 101  CO2 25  GLUCOSE 98  BUN 9  CREATININE 0.83  CALCIUM 8.8  MG --  PHOS --   No results found for this basename: AST:2,ALT:2,ALKPHOS:2,BILITOT:2,PROT:2,ALBUMIN:2 in the last 72 hours No results found for this basename: LIPASE:2,AMYLASE:2 in the last 72 hours  Basename 03/27/11 1825  WBC 10.8*  NEUTROABS 9.1*  HGB 9.4*  HCT 26.9*  MCV 100.0  PLT 132*   No results found for this basename: CKTOTAL:3,CKMB:3,CKMBINDEX:3,TROPONINI:3 in the last 72 hours No results found for this basename: TSH,T4TOTAL,FREET3,T3FREE,THYROIDAB in the last 72 hours No results found for this basename: VITAMINB12:2,FOLATE:2,FERRITIN:2,TIBC:2,IRON:2,RETICCTPCT:2 in the last 72 hours  Radiological Exams on Admission: No results found.  Assessment/Plan Present on Admission:  .Cellulitis and abscess Admit to MedSurg  Blood cultures ordered, 3 bottles including 1 to port  Antibiotics orders vancomycin and Rocephin  Abscess stomach and left wrists I&D by ER physician, cultures sent  .Breast cancer  corrected  chemotherapy History of seizure Resolved greater than 2 years On no antiepileptic medications   Full code  DVT prophylaxis Team 7/Dr. Delene Loll, Yoceline Bazar 03/27/2011, 8:35 PM

## 2011-03-27 NOTE — ED Notes (Signed)
Pt presents with lesions and rash to the left lower abd, left wrist, and back pt has a hx of right breast CA currently receiving chemo. States rash on abd area has spread significantly states started off quarter size this am and increased in size throughout the day

## 2011-03-27 NOTE — ED Notes (Signed)
Gillis Ends, RN aware of need for port access, pt with port to left chest

## 2011-03-28 ENCOUNTER — Encounter (HOSPITAL_COMMUNITY): Payer: Self-pay | Admitting: General Surgery

## 2011-03-28 DIAGNOSIS — L03319 Cellulitis of trunk, unspecified: Secondary | ICD-10-CM

## 2011-03-28 DIAGNOSIS — L02219 Cutaneous abscess of trunk, unspecified: Secondary | ICD-10-CM

## 2011-03-28 DIAGNOSIS — Z8614 Personal history of Methicillin resistant Staphylococcus aureus infection: Secondary | ICD-10-CM

## 2011-03-28 HISTORY — DX: Personal history of Methicillin resistant Staphylococcus aureus infection: Z86.14

## 2011-03-28 LAB — BASIC METABOLIC PANEL
Calcium: 8.1 mg/dL — ABNORMAL LOW (ref 8.4–10.5)
GFR calc Af Amer: >90 mL/min (ref >90)
GFR calc non Af Amer: >90 mL/min (ref >90)
Potassium: 3.1 mEq/L — ABNORMAL LOW (ref 3.5–5.1)
Sodium: 138 mEq/L (ref 135–145)

## 2011-03-28 LAB — CBC
Hemoglobin: 7.5 g/dL — ABNORMAL LOW (ref 12.0–15.0)
MCH: 34.9 pg — ABNORMAL HIGH (ref 26.0–34.0)
Platelets: 109 10*3/uL — ABNORMAL LOW (ref 150–400)
RBC: 2.15 MIL/uL — ABNORMAL LOW (ref 3.87–5.11)
WBC: 5.8 10*3/uL (ref 4.0–10.5)

## 2011-03-28 MED ORDER — MORPHINE SULFATE 4 MG/ML IJ SOLN
INTRAMUSCULAR | Status: AC
  Filled 2011-03-28: qty 1

## 2011-03-28 MED ORDER — SODIUM CHLORIDE 0.9 % IJ SOLN
10.0000 mL | INTRAMUSCULAR | Status: DC | PRN
  Administered 2011-03-28 – 2011-03-30 (×2): 10 mL

## 2011-03-28 MED ORDER — FAMOTIDINE 20 MG PO TABS
20.0000 mg | ORAL_TABLET | Freq: Two times a day (BID) | ORAL | Status: DC
  Administered 2011-03-28 – 2011-03-30 (×5): 20 mg via ORAL
  Filled 2011-03-28 (×8): qty 1

## 2011-03-28 MED ORDER — POLYETHYLENE GLYCOL 3350 17 G PO PACK
17.0000 g | PACK | Freq: Every day | ORAL | Status: DC
  Administered 2011-03-29: 17 g via ORAL
  Administered 2011-03-29: 04:00:00 via ORAL
  Administered 2011-03-30: 17 g via ORAL
  Filled 2011-03-28 (×5): qty 1

## 2011-03-28 MED ORDER — ONDANSETRON HCL 4 MG/2ML IJ SOLN
4.0000 mg | Freq: Four times a day (QID) | INTRAMUSCULAR | Status: DC | PRN
  Administered 2011-03-28: 4 mg via INTRAVENOUS

## 2011-03-28 MED ORDER — DEXAMETHASONE 4 MG PO TABS
8.0000 mg | ORAL_TABLET | Freq: Two times a day (BID) | ORAL | Status: DC
  Administered 2011-03-28 – 2011-03-29 (×3): 8 mg via ORAL
  Filled 2011-03-28 (×5): qty 2

## 2011-03-28 MED ORDER — VANCOMYCIN HCL IN DEXTROSE 1-5 GM/200ML-% IV SOLN
1000.0000 mg | Freq: Three times a day (TID) | INTRAVENOUS | Status: DC
  Administered 2011-03-28 – 2011-03-29 (×4): 1000 mg via INTRAVENOUS
  Filled 2011-03-28 (×8): qty 200

## 2011-03-28 MED ORDER — MORPHINE SULFATE 2 MG/ML IJ SOLN
2.0000 mg | INTRAMUSCULAR | Status: DC | PRN
  Administered 2011-03-28 (×2): 2 mg via INTRAVENOUS
  Filled 2011-03-28: qty 1

## 2011-03-28 MED ORDER — ALPRAZOLAM 0.5 MG PO TABS: 0.5000 mg | ORAL_TABLET | Freq: Three times a day (TID) | ORAL | Status: DC | PRN

## 2011-03-28 MED ORDER — SODIUM CHLORIDE 0.9 % IJ SOLN
10.0000 mL | Freq: Two times a day (BID) | INTRAMUSCULAR | Status: DC
  Administered 2011-03-28 – 2011-03-29 (×3): 10 mL

## 2011-03-28 MED ORDER — POTASSIUM CHLORIDE CRYS ER 20 MEQ PO TBCR
40.0000 meq | EXTENDED_RELEASE_TABLET | Freq: Once | ORAL | Status: AC
  Administered 2011-03-28: 40 meq via ORAL
  Filled 2011-03-28: qty 2

## 2011-03-28 MED ORDER — DEXTROSE 5 % IV SOLN
1.0000 g | INTRAVENOUS | Status: DC
  Filled 2011-03-28 (×2): qty 10

## 2011-03-28 MED ORDER — ONDANSETRON HCL 4 MG PO TABS: 4.0000 mg | ORAL_TABLET | Freq: Four times a day (QID) | ORAL | Status: DC | PRN

## 2011-03-28 MED ORDER — ACETAMINOPHEN 325 MG PO TABS: 650.0000 mg | ORAL_TABLET | Freq: Four times a day (QID) | ORAL | Status: DC | PRN

## 2011-03-28 MED ORDER — PROMETHAZINE HCL 25 MG PO TABS: 25.0000 mg | ORAL_TABLET | Freq: Four times a day (QID) | ORAL | Status: DC | PRN

## 2011-03-28 MED ORDER — ACETAMINOPHEN 650 MG RE SUPP: 650.0000 mg | Freq: Four times a day (QID) | RECTAL | Status: DC | PRN

## 2011-03-28 MED ORDER — SODIUM CHLORIDE 0.9 % IJ SOLN: 3.0000 mL | INTRAMUSCULAR | Status: DC | PRN

## 2011-03-28 MED ORDER — ENOXAPARIN SODIUM 40 MG/0.4ML ~~LOC~~ SOLN
40.0000 mg | SUBCUTANEOUS | Status: DC
  Administered 2011-03-28 – 2011-03-29 (×2): 40 mg via SUBCUTANEOUS
  Filled 2011-03-28 (×4): qty 0.4

## 2011-03-28 MED ORDER — SODIUM CHLORIDE 0.9 % IV SOLN
INTRAVENOUS | Status: DC
  Administered 2011-03-29 – 2011-03-30 (×4): via INTRAVENOUS

## 2011-03-28 MED ORDER — HYDROCODONE-ACETAMINOPHEN 5-325 MG PO TABS
1.0000 | ORAL_TABLET | ORAL | Status: DC | PRN
  Administered 2011-03-28 – 2011-03-29 (×7): 2 via ORAL
  Filled 2011-03-28 (×7): qty 2

## 2011-03-28 MED ORDER — ONDANSETRON HCL 4 MG/2ML IJ SOLN
INTRAMUSCULAR | Status: AC
  Administered 2011-03-28: 4 mg via INTRAVENOUS
  Filled 2011-03-28: qty 2

## 2011-03-28 NOTE — Progress Notes (Signed)
ANTIBIOTIC CONSULT NOTE - INITIAL  Pharmacy Consult for Vancomycin Indication: Cellulitis with abscess   Patient Measurements: Patient weight on 03/21/11 = 60.2 kg  Labs:  Basename 03/28/11 0810 03/27/11 1825  WBC 5.8 10.8*  HGB 7.5* 9.4*  PLT 109* 132*  LABCREA -- --  CREATININE 0.77 0.83   Estimated Creatinine Clearance: 86.4 ml/min (by C-G formula based on Cr of 0.77). No results found for this basename: VANCOTROUGH:2,VANCOPEAK:2,VANCORANDOM:2,GENTTROUGH:2,GENTPEAK:2,GENTRANDOM:2,TOBRATROUGH:2,TOBRAPEAK:2,TOBRARND:2,AMIKACINPEAK:2,AMIKACINTROU:2,AMIKACIN:2, in the last 72 hours   Microbiology: No results found for this or any previous visit (from the past 720 hour(s)).  Medical History: Past Medical History  Diagnosis Date  . Seizures     last seizure 2 years ago, diagnosed 3 years ago  . Allergy   . rt breast ca dx'd 10/2010  . Breast cancer 12/03/10   I Assessment: IV Vancomycin to be initiated for cellulitis and abscess of the stomach and wrist.  S/P I & D.  Patient also receiving Rocephin 1gm IV q24h.  CrCl (n) = 109 ml/min  Patient received Vancomycin 1gm IV x 1 in ED @ 18:50 on 03/27/11 -Blood cx 11/25, abscess cx 11/26.  Chemotherapy hx: Breast Ca, bilateral mastectomy  Cycle 5 Carbo/Taxotere/Herceptin 11/12  Neulasta 11/13  For Cycle 6 12/13 per [facility] treatment plan.  Goal of Therapy:  Vancomycin trough level 15-20 mcg/ml  Plan:  1.  Vancomycin 1000mg  IV q8h 2.  Check vancomycin trough 11/27 at 1000 3. Follow cultures, renal fx stable   Autie Vasudevan L 03/28/2011,9:58 AM

## 2011-03-28 NOTE — Consult Note (Signed)
Crystal [name] 1976-04-03  [id number].   Primary Surgeon: Dr. Chevis Pretty Requesting MD: Dr. Zannie Cove Chief Complaint/Reason for Consult: LLQ abscess/cellulitis HPI: 35 yo white female with a history of Breast cancer.  She had bilateral mastectomies with right axillary dissection by Dr. Carolynne Edouard earlier this year.  She is in the process of getting chemotherapy every 3 weeks.  She has undergone 4 treatments and is scheduled for her 5th next Monday.  This past Monday she noticed a pimple on her left brow.  The next day she noticed the same thing on the right side.  Ultimately, she developed several areas over her body on her thighs and abdomen as well.  Her husband brought her to the ED yesterday due to worsening erythema and swelling of this area on her abdomen in the LLQ.  This was I&D by the EDP and packed.  We have been asked to evaluate her today to make sure she doesn't need any further drainage.  Review of Systems: Please see HPI, otherwise all other systems have been reviewed and are negative.  Family History  Problem Relation Age of Onset  . Cancer Maternal Grandfather     Past Medical History  Diagnosis Date  . Seizures     last seizure 2 years ago, diagnosed 3 years ago  . Allergy   . rt breast ca dx'd 10/2010  . Breast cancer 12/03/10    Past Surgical History  Procedure Date  . Wisdom tooth extraction   . Breast surgery 11/16/2010    bilateral mastectomy+Ralnd,T1cN1a, Her2+,ERPR+    Social History:  reports that she quit smoking about 4 months ago. Her smoking use included Cigarettes. She has never used smokeless tobacco. She reports that she does not drink alcohol or use illicit drugs.  Allergies:  Allergies  Allergen Reactions  . Phenytoin Nausea And Vomiting  . WUJ:WJXBJYNWGNF+AOZHYQMVH+QIONGEXBMW Acid+Aspartame Other (See Comments)    faint  . Thorazine (Chlorpromazine Hcl) Hives  . Augmentin Other (See Comments)    Syncope  . Ciprofloxacin Nausea And Vomiting     Medications Prior to Admission  Medication Dose Route Frequency Provider Last Rate Last Dose  . 0.9 %  sodium chloride infusion   Intravenous Once Rodena Medin, PA 100 mL/hr at 03/27/11 1849    . 0.9 %  sodium chloride infusion   Intravenous Continuous Mary A. Lynch      . acetaminophen (TYLENOL) tablet 650 mg  650 mg Oral Q6H PRN Debby Crosley, MD       Or  . acetaminophen (TYLENOL) suppository 650 mg  650 mg Rectal Q6H PRN Debby Crosley, MD      . ALPRAZolam Prudy Feeler) tablet 0.5 mg  0.5 mg Oral TID PRN Debby Crosley, MD      . cefTRIAXone (ROCEPHIN) 1 g in dextrose 5 % 50 mL IVPB  1 g Intravenous Once Rodena Medin, PA   1 g at 03/27/11 2032  . dexamethasone (DECADRON) tablet 8 mg  8 mg Oral BID WC Debby Crosley, MD   8 mg at 03/28/11 0858  . enoxaparin (LOVENOX) injection 40 mg  40 mg Subcutaneous Q24H Debby Crosley, MD   40 mg at 03/28/11 0953  . famotidine (PEPCID) tablet 20 mg  20 mg Oral BID Gery Pray, MD   20 mg at 03/28/11 0952  . HYDROcodone-acetaminophen (NORCO) 5-325 MG per tablet 1-2 tablet  1-2 tablet Oral Q4H PRN Gery Pray, MD   2 tablet at 03/28/11 0952  . HYDROmorphone (DILAUDID)  injection 1 mg  1 mg Intravenous Once Rodena Medin, PA      . lidocaine (XYLOCAINE) 1 % (with pres) injection           . morphine 2 MG/ML injection 2 mg  2 mg Intravenous Q4H PRN Debby Crosley, MD   2 mg at 03/28/11 1320  . morphine 4 MG/ML injection 4 mg  4 mg Intravenous Once Rodena Medin, PA   4 mg at 03/27/11 1849  . ondansetron (ZOFRAN) injection 4 mg  4 mg Intravenous Once Rodena Medin, PA   4 mg at 03/27/11 1849  . ondansetron (ZOFRAN) tablet 4 mg  4 mg Oral Q6H PRN Debby Crosley, MD       Or  . ondansetron (ZOFRAN) injection 4 mg  4 mg Intravenous Q6H PRN Debby Crosley, MD   4 mg at 03/28/11 0004  . polyethylene glycol (MIRALAX / GLYCOLAX) packet 17 g  17 g Oral Daily Debby Crosley, MD      . potassium chloride SA (K-DUR,KLOR-CON) CR tablet 40 mEq  40 mEq Oral Once  Preetha Joseph   40 mEq at 03/28/11 1320  . promethazine (PHENERGAN) tablet 25 mg  25 mg Oral Q6H PRN Debby Crosley, MD      . sodium chloride 0.9 % injection 10 mL  10 mL Intracatheter Q12H Preetha Joseph      . sodium chloride 0.9 % injection 10 mL  10 mL Intracatheter PRN Preetha Joseph   10 mL at 03/28/11 0818  . sodium chloride 0.9 % injection 3 mL  3 mL Intravenous PRN Debby Crosley, MD      . vancomycin (VANCOCIN) IVPB 1000 mg/200 mL premix  1,000 mg Intravenous Once Rodena Medin, PA   1,000 mg at 03/27/11 1850  . vancomycin (VANCOCIN) IVPB 1000 mg/200 mL premix  1,000 mg Intravenous Q8H Leann Trefz Poindexter, PHARMD   1,000 mg at 03/28/11 0331  . DISCONTD: cefTRIAXone (ROCEPHIN) 1 g in dextrose 5 % 50 mL IVPB  1 g Intravenous Q24H Debby Crosley, MD      . DISCONTD: HYDROmorphone (DILAUDID) injection 1 mg  1 mg Intravenous Once Rodena Medin, PA      . DISCONTD: HYDROmorphone (DILAUDID) injection 2 mg  2 mg Intravenous Once Rodena Medin, PA   1 mg at 03/27/11 2049   Medications Prior to Admission  Medication Sig Dispense Refill  . ALPRAZolam (XANAX) 0.5 MG tablet Take 0.5 mg by mouth 3 (three) times daily as needed. anxiety      . dexamethasone (DECADRON) 4 MG tablet Take 8 mg by mouth 2 (two) times daily with a meal. . Pt will start the regimen of dexamethasone back on 04-03-11.      . famotidine (PEPCID) 20 MG tablet Take 20 mg by mouth 2 (two) times daily.       Marland Kitchen lidocaine-prilocaine (EMLA) cream Apply topically as needed. Apply to port as needed      . promethazine (PHENERGAN) 25 MG tablet Take 25 mg by mouth every 6 (six) hours as needed. nausea      . traMADol (ULTRAM) 50 MG tablet Take 50 mg by mouth every 6 (six) hours as needed. pain        Blood pressure 98/63, pulse 111, temperature 98.9 F (37.2 C), temperature source Oral, resp. rate 16, height 5\' 4"  (1.626 m), weight 132 lb 4.4 oz (60 kg), SpO2 92.00%. BP 98/63  Pulse 111  Temp(Src) 98.9 F (37.2 C) (  Oral)   Resp 16  Ht 5\' 4"  (1.626 m)  Wt 132 lb 4.4 oz (60 kg)  BMI 22.71 kg/m2  SpO2 92%  General Appearance:    Alert, cooperative, no distress, appears stated age  Head:    Normocephalic, without obvious abnormality, atraumatic, no hair present  Eyes:    PERRL, conjunctiva/corneas clear, EOM's intact     Nose:   Nares normal, septum midline, mucosa normal, no drainage      Throat:   Lips, mucosa, and tongue normal; teeth and gums normal        Lungs:     Clear to auscultation bilaterally, respirations unlabored  Chest Wall:    No tenderness, mastectomy scars are well healed   Heart:    Regular rate and rhythm, S1 and S2 normal, no murmur, rub   or gallop.  Palpable carotid, radial, and pedal pulses bilaterally.     Abdomen:     Soft, non-tender, bowel sounds active all four quadrants,    no masses, no organomegaly.  She does have an area of 8x4 cm of induration in the LLQ.  Some erythema noted.  In the middle of this is a small 1 cm incision with packing in place.  The packing was removed and new packing ordered to be replaced.        Extremities:   Extremities normal, no clubbing, no cyanosis or edema, left wrist with I&D wound from abscess.  This is packed.  Several small areas with minimal induration on her bilateral thighs.  No purulence.     Skin:   Skin color, texture, turgor normal, no rashes or lesions     Psych:    The patient is alert and oriented x3 with an appropriate affect.      Results for orders placed during the hospital encounter of 03/27/11 (from the past 48 hour(s))  CBC     Status: Abnormal   Collection Time   03/27/11  6:25 PM      Component Value Range Comment   WBC 10.8 (*) 4.0 - 10.5 (K/uL)    RBC 2.69 (*) 3.87 - 5.11 (MIL/uL)    Hemoglobin 9.4 (*) 12.0 - 15.0 (g/dL)    HCT 41.3 (*) 24.4 - 46.0 (%)    MCV 100.0  78.0 - 100.0 (fL)    MCH 34.9 (*) 26.0 - 34.0 (pg)    MCHC 34.9  30.0 - 36.0 (g/dL)    RDW 01.0  27.2 - 53.6 (%)    Platelets 132 (*) 150 -  400 (K/uL)   DIFFERENTIAL     Status: Abnormal   Collection Time   03/27/11  6:25 PM      Component Value Range Comment   Neutrophils Relative 84 (*) 43 - 77 (%)    Lymphocytes Relative 11 (*) 12 - 46 (%)    Monocytes Relative 5  3 - 12 (%)    Eosinophils Relative 0  0 - 5 (%)    Basophils Relative 0  0 - 1 (%)    Neutro Abs 9.1 (*) 1.7 - 7.7 (K/uL)    Lymphs Abs 1.2  0.7 - 4.0 (K/uL)    Monocytes Absolute 0.5  0.1 - 1.0 (K/uL)    Eosinophils Absolute 0.0  0.0 - 0.7 (K/uL)    Basophils Absolute 0.0  0.0 - 0.1 (K/uL)    Smear Review MORPHOLOGY UNREMARKABLE     BASIC METABOLIC PANEL     Status: Abnormal   Collection Time  03/27/11  6:25 PM      Component Value Range Comment   Sodium 136  135 - 145 (mEq/L)    Potassium 3.1 (*) 3.5 - 5.1 (mEq/L)    Chloride 101  96 - 112 (mEq/L)    CO2 25  19 - 32 (mEq/L)    Glucose, Bld 98  70 - 99 (mg/dL)    BUN 9  6 - 23 (mg/dL)    Creatinine, Ser 0.45  0.50 - 1.10 (mg/dL)    Calcium 8.8  8.4 - 10.5 (mg/dL)    GFR calc non Af Amer >90  >90 (mL/min)    GFR calc Af Amer >90  >90 (mL/min)   LACTIC ACID, PLASMA     Status: Normal   Collection Time   03/27/11  8:35 PM      Component Value Range Comment   Lactic Acid, Venous 0.7  0.5 - 2.2 (mmol/L)   BASIC METABOLIC PANEL     Status: Abnormal   Collection Time   03/28/11  8:10 AM      Component Value Range Comment   Sodium 138  135 - 145 (mEq/L)    Potassium 3.1 (*) 3.5 - 5.1 (mEq/L)    Chloride 104  96 - 112 (mEq/L)    CO2 27  19 - 32 (mEq/L)    Glucose, Bld 102 (*) 70 - 99 (mg/dL)    BUN 7  6 - 23 (mg/dL)    Creatinine, Ser 4.09  0.50 - 1.10 (mg/dL)    Calcium 8.1 (*) 8.4 - 10.5 (mg/dL)    GFR calc non Af Amer >90  >90 (mL/min)    GFR calc Af Amer >90  >90 (mL/min)   CBC     Status: Abnormal   Collection Time   03/28/11  8:10 AM      Component Value Range Comment   WBC 5.8  4.0 - 10.5 (K/uL)    RBC 2.15 (*) 3.87 - 5.11 (MIL/uL)    Hemoglobin 7.5 (*) 12.0 - 15.0 (g/dL)    HCT 81.1  (*) 91.4 - 46.0 (%)    MCV 102.8 (*) 78.0 - 100.0 (fL)    MCH 34.9 (*) 26.0 - 34.0 (pg)    MCHC 33.9  30.0 - 36.0 (g/dL)    RDW 78.2  95.6 - 21.3 (%)    Platelets 109 (*) 150 - 400 (K/uL)    No results found.     Assessment/Plan 1. Probably MRSA abscesses, largest on abd in LLQ, s/p I&D by EDP 2. Multiple other areas of cellulitis   Plan: Cultures are currently pending.  I suspect that these are MRSA secondary to presentation symptoms and the fact that the patient in immunocompromised secondary to chemo.  We will start BID dressing changes to both the abdominal wound and the wrist wound with 1/4 in plain packing gauze.  Currently, I do not feel that a repeat I&D will be needed.  I suspect the antibiotics and recent I&Ds will be enough.  However, if the patient doesn't improve or worsens then we may need to repeat and I&D.  We will follow.  Thank you for this consult.  Jacarius Handel E 03/28/2011, 2:16 PM

## 2011-03-28 NOTE — Progress Notes (Signed)
ANTIBIOTIC CONSULT NOTE - INITIAL  Pharmacy Consult for Vancomycin Indication: Cellulitis with abscess  Allergies  Allergen Reactions  . Phenytoin Nausea And Vomiting  . AVW:UJWJXBJYNWG+NFAOZHYQM+VHQIONGEXB Acid+Aspartame Other (See Comments)    faint  . Thorazine (Chlorpromazine Hcl) Hives  . Augmentin Other (See Comments)    Syncope  . Ciprofloxacin Nausea And Vomiting    Patient Measurements: Patient weight on 03/21/11 = 60.2 kg  Vital Signs: Temp: 98.2 F (36.8 C) (11/26 0040) Temp src: Oral (11/26 0040) BP: 97/49 mmHg (11/26 0040) Pulse Rate: 111  (11/26 0040) Intake/Output from previous day:   Intake/Output from this shift:    Labs:  [facility] 03/27/11 1825  WBC 10.8*  HGB 9.4*  PLT 132*  LABCREA --  CREATININE 0.83   The CrCl is unknown because both a height and weight (above a minimum accepted value) are required for this calculation. No results found for this basename: VANCOTROUGH:2,VANCOPEAK:2,VANCORANDOM:2,GENTTROUGH:2,GENTPEAK:2,GENTRANDOM:2,TOBRATROUGH:2,TOBRAPEAK:2,TOBRARND:2,AMIKACINPEAK:2,AMIKACINTROU:2,AMIKACIN:2, in the last 72 hours   Microbiology: No results found for this or any previous visit (from the past 720 hour(s)).  Medical History: Past Medical History  Diagnosis Date  . Seizures     last seizure 2 years ago, diagnosed 3 years ago  . Allergy   . rt breast ca dx'd 10/2010  . Breast cancer 12/03/10    Medications:  Scheduled:    . sodium chloride   Intravenous Once  . cefTRIAXone (ROCEPHIN) IV  1 g Intravenous Once  . cefTRIAXone (ROCEPHIN) IV  1 g Intravenous Q24H  . dexamethasone  8 mg Oral BID WC  . enoxaparin  40 mg Subcutaneous Q24H  . famotidine  20 mg Oral BID  .  HYDROmorphone (DILAUDID) injection  1 mg Intravenous Once  . lidocaine      .  morphine injection  4 mg Intravenous Once  . ondansetron (ZOFRAN) IV  4 mg Intravenous Once  . polyethylene glycol  17 g Oral Daily  . vancomycin  1,000 mg Intravenous Once  .  DISCONTD:  HYDROmorphone (DILAUDID) injection  1 mg Intravenous Once  . DISCONTD:  HYDROmorphone (DILAUDID) injection  2 mg Intravenous Once   Infusions:   Assessment: IV Vancomycin to be initiated for cellulitis and abscess of the stomach and wrist.  S/P I & D.  Patient also receiving Rocephin 1gm IV q24h.  CrCl (n) = 109 ml/min  Patient received Vancomycin 1gm IV x 1 in ED @ 18:50 on 03/27/11  Goal of Therapy:  Vancomycin trough level 15-20 mcg/ml  Plan:  1.  Vancomycin 1000mg  IV q8h 2.  Check vancomycin trough level when appropriate.  Euleta Belson Trefz 03/28/2011,2:15 AM

## 2011-03-28 NOTE — ED Notes (Signed)
Patient stable upon admission. Patient is in no pain at this time and patient states understanding of admission.

## 2011-03-28 NOTE — Consult Note (Signed)
Pt examined, agree with above note.  Will follow.  Mariella Saa MD, FACS  03/28/2011, 2:50 PM

## 2011-03-28 NOTE — Progress Notes (Signed)
UR CHART REVIEWED 

## 2011-03-28 NOTE — ED Provider Notes (Signed)
    HPI      Review of Systems  Allergies  Phenytoin; ZOX:WRUEAVWUJWJ+XBJYNWGNF+AOZHYQMVHQ acid+aspartame; Thorazine; Augmentin; and Ciprofloxacin   BP 97/49  Pulse 111  Temp(Src) 98.2 F (36.8 C) (Oral)  Resp 15  SpO2 98%  Physical Exam  ED Course  Angiocath insertion Date/Time: 03/27/2011 1:10 AM Performed by: Raeford Razor Authorized by: Raeford Razor Consent: Verbal consent obtained. Risks and benefits: risks, benefits and alternatives were discussed Consent given by: patient Patient identity confirmed: verbally with patient and arm band Preparation: Patient was prepped and draped in the usual sterile fashion. Local anesthesia used: no Patient sedated: no Patient tolerance: Patient tolerated the procedure well with no immediate complications. Comments: 18g angio cath into L Ext Jug. Pt tolerated well. Additional line wanted for potential ongoing resus.  ABDOMINAL PARACENTESIS Performed by: Raeford Razor Authorized by: Raeford Razor   INCISION AND DRAINAGE Performed by: Raeford Razor Consent: Verbal consent obtained. Risks and benefits: risks, benefits and alternatives were discussed Type: abscess  Body area: abdominal wall and L wrist  Anesthesia: local infiltration  Local anesthetic: lidocaine 1% w/ epinephrine  Anesthetic total: 5 ml  Complexity: complex Blunt dissection to break up loculations  Drainage: purulent  Drainage amount: moderate  Packing material: 1/4 in iodoform gauze  Patient tolerance: Patient tolerated the procedure well with no immediate complications.      (including critical care time)  Labs Reviewed  CBC - Abnormal; Notable for the following:    WBC 10.8 (*)    RBC 2.69 (*)    Hemoglobin 9.4 (*)    HCT 26.9 (*)    MCH 34.9 (*)    Platelets 132 (*)    All other components within normal limits  DIFFERENTIAL - Abnormal; Notable for the following:    Neutrophils Relative 84 (*)    Lymphocytes Relative 11  (*)    Neutro Abs 9.1 (*)    All other components within normal limits  BASIC METABOLIC PANEL - Abnormal; Notable for the following:    Potassium 3.1 (*)    All other components within normal limits  LACTIC ACID, PLASMA  CULTURE, BLOOD (ROUTINE X 2)  CULTURE, BLOOD (ROUTINE X 2)   No results found.   1. MRSA (methicillin resistant Staphylococcus aureus)       MDM  Medical screening examination/treatment/procedure(s) were conducted as a shared visit with non-physician practitioner(s) and myself.  I personally evaluated the patient during the encounter.  35yF multiple abscesses. I&D'd abdominal wall and L wrist. Concern for rapid progression of symptoms. No clinical evidence of nec fasc. Given hx of immunocompromise zosyn added. Admission.       Raeford Razor, MD 03/28/11 [phone]

## 2011-03-28 NOTE — Progress Notes (Signed)
Subjective: Multiple erythematous lesions, largest LLQ abdominal wall Feels sick, weak  Objective: Vital signs in last 24 hours: Temp:  [98.2 F (36.8 C)-98.9 F (37.2 C)] 98.9 F (37.2 C) (11/26 0315) Pulse Rate:  [98-133] 111  (11/26 0315) Resp:  [15-16] 16  (11/26 0315) BP: (97-112)/(49-72) 98/63 mmHg (11/26 0315) SpO2:  [92 %-100 %] 92 % (11/26 0315) Weight:  [60 kg (132 lb 4.4 oz)] 132 lb 4.4 oz (60 kg) (11/26 0315) Weight change:  Last BM Date: 03/27/11  Intake/Output from previous day: 11/25 0701 - 11/26 0700 In: 602 [I.V.:400; IV Piggyback:202] Out: -  Total I/O In: 240 [P.O.:240] Out: -   Physical Exam: General: Alert, awake, oriented x3, in no acute distress. HEENT: No bruits, no goiter. Heart: Regular rate and rhythm, without murmurs, rubs, gallops. Lungs: Clear to auscultation bilaterally. Abdomen: Soft, large area of erythema, induration with central I&D, no drainage otherwise nondistended, positive bowel sounds. Extremities: No clubbing cyanosis or edema with positive pedal pulses. Neuro: Grossly intact, nonfocal. Skin: multiple erythematous lesions though out legs, back  Lab Results: Basic Metabolic Panel:  Basename 03/28/11 0810 03/27/11 1825  NA 138 136  K 3.1* 3.1*  CL 104 101  CO2 27 25  GLUCOSE 102* 98  BUN 7 9  CREATININE 0.77 0.83  CALCIUM 8.1* 8.8  MG -- --  PHOS -- --   Liver Function Tests: No results found for this basename: AST:2,ALT:2,ALKPHOS:2,BILITOT:2,PROT:2,ALBUMIN:2 in the last 72 hours No results found for this basename: LIPASE:2,AMYLASE:2 in the last 72 hours No results found for this basename: AMMONIA:2 in the last 72 hours CBC:  Basename 03/28/11 0810 03/27/11 1825  WBC 5.8 10.8*  NEUTROABS -- 9.1*  HGB 7.5* 9.4*  HCT 22.1* 26.9*  MCV 102.8* 100.0  PLT 109* 132*   Cardiac Enzymes: No results found for this basename: CKTOTAL:3,CKMB:3,CKMBINDEX:3,TROPONINI:3 in the last 72 hours BNP: No results found for this  basename: POCBNP:3 in the last 72 hours D-Dimer: No results found for this basename: DDIMER:2 in the last 72 hours CBG: No results found for this basename: GLUCAP:6 in the last 72 hours Hemoglobin A1C: No results found for this basename: HGBA1C in the last 72 hours Fasting Lipid Panel: No results found for this basename: CHOL,HDL,LDLCALC,TRIG,CHOLHDL,LDLDIRECT in the last 72 hours Thyroid Function Tests: No results found for this basename: TSH,T4TOTAL,FREET4,T3FREE,THYROIDAB in the last 72 hours Anemia Panel: No results found for this basename: VITAMINB12,FOLATE,FERRITIN,TIBC,IRON,RETICCTPCT in the last 72 hours Coagulation: No results found for this basename: LABPROT:2,INR:2 in the last 72 hours Urine Drug Screen: Drugs of Abuse     Component Value Date/Time   LABOPIA NONE DETECTED 06/24/2010 0034   COCAINSCRNUR NONE DETECTED 06/24/2010 0034   LABBENZ POSITIVE* 06/24/2010 0034   AMPHETMU NONE DETECTED 06/24/2010 0034   THCU NONE DETECTED 06/24/2010 0034   LABBARB  Value: NONE DETECTED        DRUG SCREEN FOR MEDICAL PURPOSES ONLY.  IF CONFIRMATION IS NEEDED FOR ANY PURPOSE, NOTIFY LAB WITHIN 5 DAYS.        LOWEST DETECTABLE LIMITS FOR URINE DRUG SCREEN Drug Class       Cutoff (ng/mL) Amphetamine      1000 Barbiturate      200 Benzodiazepine   200 Tricyclics       300 Opiates          300 Cocaine          300 THC  50 06/24/2010 0034    Alcohol Level: No results found for this basename: ETH:2 in the last 72 hours  No results found for this or any previous visit (from the past 240 hour(s)).  Studies/Results: No results found.  Medications: Scheduled Meds:   . sodium chloride   Intravenous Once  . cefTRIAXone (ROCEPHIN) IV  1 g Intravenous Once  . cefTRIAXone (ROCEPHIN) IV  1 g Intravenous Q24H  . dexamethasone  8 mg Oral BID WC  . enoxaparin  40 mg Subcutaneous Q24H  . famotidine  20 mg Oral BID  .  HYDROmorphone (DILAUDID) injection  1 mg Intravenous Once  . lidocaine       .  morphine injection  4 mg Intravenous Once  . ondansetron (ZOFRAN) IV  4 mg Intravenous Once  . polyethylene glycol  17 g Oral Daily  . potassium chloride  40 mEq Oral Once  . sodium chloride  10 mL Intracatheter Q12H  . vancomycin  1,000 mg Intravenous Once  . vancomycin  1,000 mg Intravenous Q8H  . DISCONTD:  HYDROmorphone (DILAUDID) injection  1 mg Intravenous Once  . DISCONTD:  HYDROmorphone (DILAUDID) injection  2 mg Intravenous Once   Continuous Infusions:   . sodium chloride     PRN Meds:.acetaminophen, acetaminophen, ALPRAZolam, HYDROcodone-acetaminophen, morphine, ondansetron (ZOFRAN) IV, ondansetron, promethazine, sodium chloride, sodium chloride  Assessment/Plan: 1.Cellulitis and abscess: suspect MRSA related, cont IV Vancomycin, FU cultures Will ask Surgery to review the large area in abd wall if it needs further I&D 2.Breast cancer: s/p B/L mastectomy on 5th cycle of chemotherapy 3. Seizure: stable off anti epileptics   LOS: 1 day   Crystal [name] 03/28/2011, 10:09 AM

## 2011-03-29 DIAGNOSIS — A4902 Methicillin resistant Staphylococcus aureus infection, unspecified site: Secondary | ICD-10-CM

## 2011-03-29 DIAGNOSIS — C50419 Malignant neoplasm of upper-outer quadrant of unspecified female breast: Secondary | ICD-10-CM

## 2011-03-29 DIAGNOSIS — T451X5A Adverse effect of antineoplastic and immunosuppressive drugs, initial encounter: Secondary | ICD-10-CM | POA: Diagnosis present

## 2011-03-29 MED ORDER — SODIUM CHLORIDE 0.9 % IV SOLN
4.0000 mg/kg | INTRAVENOUS | Status: DC
  Administered 2011-03-29: 240 mg via INTRAVENOUS
  Filled 2011-03-29 (×2): qty 4.8

## 2011-03-29 MED ORDER — DEXAMETHASONE 2 MG PO TABS
2.0000 mg | ORAL_TABLET | Freq: Every day | ORAL | Status: DC
  Administered 2011-03-30: 2 mg via ORAL
  Filled 2011-03-29: qty 1

## 2011-03-29 MED ORDER — HYDROMORPHONE HCL PF 2 MG/ML IJ SOLN
1.0000 mg | INTRAMUSCULAR | Status: DC | PRN
  Administered 2011-03-29: 1 mg via INTRAVENOUS
  Administered 2011-03-30: 2 mg via INTRAVENOUS
  Administered 2011-03-30 (×2): 1 mg via INTRAVENOUS
  Filled 2011-03-29 (×4): qty 1

## 2011-03-29 NOTE — Progress Notes (Signed)
Patient ID: Crystal [name], female   DOB: [birth year], 35 y.o.   MRN: [id number]    Subjective: Pt feels better.  Has some complaints of pain in abd when she urinates.  Objective: Vital signs in last 24 hours: Temp:  [97.6 F (36.4 C)-97.7 F (36.5 C)] 97.7 F (36.5 C) (11/27 0700) Pulse Rate:  [77-101] 77  (11/27 0700) Resp:  [16-18] 18  (11/27 0700) BP: (99-121)/(66-71) 99/68 mmHg (11/27 0700) SpO2:  [96 %-98 %] 98 % (11/27 0700) Last BM Date: 03/27/11  Intake/Output from previous day: 11/26 0701 - 11/27 0700 In: 2320 [P.O.:720; I.V.:1200; IV Piggyback:400] Out: -  Intake/Output this shift:    PE: Abd: soft, decrease erythema.  Still with some cloudy, serous drainage from wound.    Lab Results:   [facility] 03/28/11 0810 03/27/11 1825  WBC 5.8 10.8*  HGB 7.5* 9.4*  HCT 22.1* 26.9*  PLT 109* 132*   BMET  Basename 03/28/11 0810 03/27/11 1825  NA 138 136  K 3.1* 3.1*  CL 104 101  CO2 27 25  GLUCOSE 102* 98  BUN 7 9  CREATININE 0.77 0.83  CALCIUM 8.1* 8.8   PT/INR No results found for this basename: LABPROT:2,INR:2 in the last 72 hours   Studies/Results: No results found.  Anti-infectives: Anti-infectives     Start     Dose/Rate Route Frequency Ordered Stop   03/28/11 0300   vancomycin (VANCOCIN) IVPB 1000 mg/200 mL premix        1,000 mg 200 mL/hr over 60 Minutes Intravenous Every 8 hours 03/28/11 0224     03/28/11 0133   cefTRIAXone (ROCEPHIN) 1 g in dextrose 5 % 50 mL IVPB  Status:  Discontinued        1 g 100 mL/hr over 30 Minutes Intravenous Every 24 hours 03/28/11 0133 03/28/11 1017   03/27/11 2000   cefTRIAXone (ROCEPHIN) 1 g in dextrose 5 % 50 mL IVPB        1 g 100 mL/hr over 30 Minutes Intravenous  Once 03/27/11 1953 03/27/11 2102   03/27/11 1830   vancomycin (VANCOCIN) IVPB 1000 mg/200 mL premix        1,000 mg 200 mL/hr over 60 Minutes Intravenous  Once 03/27/11 1800 03/27/11 1950           Assessment/Plan  1. abd wall abscess, s/p  I&D  Plan: Will plan to make incision bigger today to make sure this wound can cont to drain appropriately.  Will order up a tray and complete later today.       LOS: 2 days    Drevion Offord E 03/29/2011

## 2011-03-29 NOTE — Progress Notes (Signed)
CC: admitted 03/27/11 with multiple skin lesions. S. Aureus positive, Sn pending  Subjective: Crystal [name] is alert, comfortable. She tells me she took dexamethasone beyond the 3 days scheduled (days 0-2 with each 3-wee chemo treatment) because she was tired and needed the energy. She noted a lesion near the outside of her L eyebrow, then one on the R, then along her LLQ. This brought her to seek medical attention.  ROS": she denies unusual headaches, visual changes, nausea or vomiting; cough, phlegm production or pleurisy; change in bladder or bowel habits; unusuall pain, fever or bleeding.  Objective: 97.7,  77  ,  99/68,    98% RA  No oral lesions No adenopathy Lungs no rales or rhonnchi  Heart regular rate and rhythm Positive bowel sounds No edema Neuro nonfocal Skin: multiple boil-like lesions, largets L wrist area; lesions near eyebrows scabbing over  Lab Results:  [facility] 03/28/11 0810 03/27/11 1825  WBC 5.8 10.8*  HGB 7.5* 9.4*  HCT 22.1* 26.9*  PLT 109* 132*   @LASTCHEMISTRY @  BMET  Basename 03/28/11 0810 03/27/11 1825  NA 138 136  K 3.1* 3.1*  CL 104 101  CO2 27 25  GLUCOSE 102* 98  BUN 7 9  CREATININE 0.77 0.83  CALCIUM 8.1* 8.8       Studies/Results: No results found.  Current Facility-Administered Medications  Medication Dose Route Frequency Provider Last Rate Last Dose  . 0.9 %  sodium chloride infusion   Intravenous Continuous Preetha Joseph 100 mL/hr at 03/29/11 0245    . acetaminophen (TYLENOL) tablet 650 mg  650 mg Oral Q6H PRN Debby Crosley, MD       Or  . acetaminophen (TYLENOL) suppository 650 mg  650 mg Rectal Q6H PRN Debby Crosley, MD      . ALPRAZolam Prudy Feeler) tablet 0.5 mg  0.5 mg Oral TID PRN Debby Crosley, MD      . dexamethasone (DECADRON) tablet 8 mg  8 mg Oral BID WC Debby Crosley, MD   8 mg at 03/29/11 1033  . enoxaparin (LOVENOX) injection 40 mg  40 mg Subcutaneous Q24H Debby Crosley, MD   40 mg at 03/29/11 1213  . famotidine  (PEPCID) tablet 20 mg  20 mg Oral BID Gery Pray, MD   20 mg at 03/29/11 1212  . HYDROcodone-acetaminophen (NORCO) 5-325 MG per tablet 1-2 tablet  1-2 tablet Oral Q4H PRN Gery Pray, MD   2 tablet at 03/29/11 0902  . HYDROmorphone (DILAUDID) injection 1 mg  1 mg Intravenous Once Rodena Medin, PA      . morphine 2 MG/ML injection 2 mg  2 mg Intravenous Q4H PRN Debby Crosley, MD   2 mg at 03/28/11 1320  . ondansetron (ZOFRAN) tablet 4 mg  4 mg Oral Q6H PRN Debby Crosley, MD       Or  . ondansetron (ZOFRAN) injection 4 mg  4 mg Intravenous Q6H PRN Debby Crosley, MD   4 mg at 03/28/11 0004  . polyethylene glycol (MIRALAX / GLYCOLAX) packet 17 g  17 g Oral Daily Debby Crosley, MD   17 g at 03/29/11 1212  . potassium chloride SA (K-DUR,KLOR-CON) CR tablet 40 mEq  40 mEq Oral Once Preetha Joseph   40 mEq at 03/28/11 1320  . promethazine (PHENERGAN) tablet 25 mg  25 mg Oral Q6H PRN Debby Crosley, MD      . sodium chloride 0.9 % injection 10 mL  10 mL Intracatheter Q12H Preetha Joseph   10 mL at 03/29/11  1013  . sodium chloride 0.9 % injection 10 mL  10 mL Intracatheter PRN Preetha Joseph   10 mL at 03/28/11 0818  . sodium chloride 0.9 % injection 3 mL  3 mL Intravenous PRN Debby Crosley, MD      . DISCONTD: vancomycin (VANCOCIN) IVPB 1000 mg/200 mL premix  1,000 mg Intravenous Q8H Leann Trefz Poindexter, PHARMD   1,000 mg at 03/29/11 0244    Medications Prior to Admission  Medication Dose Route Frequency Provider Last Rate Last Dose  . 0.9 %  sodium chloride infusion   Intravenous Once Rodena Medin, PA 100 mL/hr at 03/27/11 1849    . 0.9 %  sodium chloride infusion   Intravenous Continuous Preetha Joseph 100 mL/hr at 03/29/11 0245    . acetaminophen (TYLENOL) tablet 650 mg  650 mg Oral Q6H PRN Debby Crosley, MD       Or  . acetaminophen (TYLENOL) suppository 650 mg  650 mg Rectal Q6H PRN Debby Crosley, MD      . ALPRAZolam Prudy Feeler) tablet 0.5 mg  0.5 mg Oral TID PRN Debby Crosley, MD      .  cefTRIAXone (ROCEPHIN) 1 g in dextrose 5 % 50 mL IVPB  1 g Intravenous Once Rodena Medin, PA   1 g at 03/27/11 2032  . dexamethasone (DECADRON) tablet 8 mg  8 mg Oral BID WC Debby Crosley, MD   8 mg at 03/29/11 1033  . enoxaparin (LOVENOX) injection 40 mg  40 mg Subcutaneous Q24H Debby Crosley, MD   40 mg at 03/29/11 1213  . famotidine (PEPCID) tablet 20 mg  20 mg Oral BID Gery Pray, MD   20 mg at 03/29/11 1212  . HYDROcodone-acetaminophen (NORCO) 5-325 MG per tablet 1-2 tablet  1-2 tablet Oral Q4H PRN Gery Pray, MD   2 tablet at 03/29/11 0902  . HYDROmorphone (DILAUDID) injection 1 mg  1 mg Intravenous Once Rodena Medin, PA      . lidocaine (XYLOCAINE) 1 % (with pres) injection           . morphine 2 MG/ML injection 2 mg  2 mg Intravenous Q4H PRN Debby Crosley, MD   2 mg at 03/28/11 1320  . morphine 4 MG/ML injection 4 mg  4 mg Intravenous Once Rodena Medin, PA   4 mg at 03/27/11 1849  . ondansetron (ZOFRAN) injection 4 mg  4 mg Intravenous Once Rodena Medin, PA   4 mg at 03/27/11 1849  . ondansetron (ZOFRAN) tablet 4 mg  4 mg Oral Q6H PRN Debby Crosley, MD       Or  . ondansetron (ZOFRAN) injection 4 mg  4 mg Intravenous Q6H PRN Debby Crosley, MD   4 mg at 03/28/11 0004  . polyethylene glycol (MIRALAX / GLYCOLAX) packet 17 g  17 g Oral Daily Debby Crosley, MD   17 g at 03/29/11 1212  . potassium chloride SA (K-DUR,KLOR-CON) CR tablet 40 mEq  40 mEq Oral Once Preetha Joseph   40 mEq at 03/28/11 1320  . promethazine (PHENERGAN) tablet 25 mg  25 mg Oral Q6H PRN Debby Crosley, MD      . sodium chloride 0.9 % injection 10 mL  10 mL Intracatheter Q12H Preetha Joseph   10 mL at 03/29/11 1013  . sodium chloride 0.9 % injection 10 mL  10 mL Intracatheter PRN Preetha Joseph   10 mL at 03/28/11 0818  . sodium chloride 0.9 % injection 3 mL  3 mL  Intravenous PRN Gery Pray, MD      . vancomycin (VANCOCIN) IVPB 1000 mg/200 mL premix  1,000 mg Intravenous Once Rodena Medin, PA   1,000  mg at 03/27/11 1850  . DISCONTD: cefTRIAXone (ROCEPHIN) 1 g in dextrose 5 % 50 mL IVPB  1 g Intravenous Q24H Debby Crosley, MD      . DISCONTD: HYDROmorphone (DILAUDID) injection 1 mg  1 mg Intravenous Once Rodena Medin, PA      . DISCONTD: HYDROmorphone (DILAUDID) injection 2 mg  2 mg Intravenous Once Rodena Medin, PA   1 mg at 03/27/11 2049  . DISCONTD: vancomycin (VANCOCIN) IVPB 1000 mg/200 mL premix  1,000 mg Intravenous Q8H Leann Trefz Poindexter, PHARMD   1,000 mg at 03/29/11 0244   Medications Prior to Admission  Medication Sig Dispense Refill  . ALPRAZolam (XANAX) 0.5 MG tablet Take 0.5 mg by mouth 3 (three) times daily as needed. anxiety      . dexamethasone (DECADRON) 4 MG tablet Take 8 mg by mouth 2 (two) times daily with a meal. . Pt will start the regimen of dexamethasone back on 04-03-11.      . famotidine (PEPCID) 20 MG tablet Take 20 mg by mouth 2 (two) times daily.       Marland Kitchen lidocaine-prilocaine (EMLA) cream Apply topically as needed. Apply to port as needed      . promethazine (PHENERGAN) 25 MG tablet Take 25 mg by mouth every 6 (six) hours as needed. nausea      . traMADol (ULTRAM) 50 MG tablet Take 50 mg by mouth every 6 (six) hours as needed. pain        Assessment: 35 year old [place] woman, known to be BRCA1 and 2 negative, status post bilateral mastectomies May of 2012 for a right-sided T1c N1 (stage II) grade 3 invasive ductal carcinoma,estrogen and progesterone receptor positive, HER-2 amplified, with an MIB-1-1 of 85%. Currently receiving chemotherapy including dexamethasone premeds. Admitted with disseminated S. Aureus skin infection.   Plan: agree w. Vancomycin until sensitivities available. She may go off dexamethasone--it should not have been continued except at minimal doses beyond the 3 days around the last treatment, and likely copntributed to the immunocompromise leading to he current problem. Will write for taper.  She has an appt. In our office Dec 3,  and if she has been discharged by then she should keep it; however we will change her last chemotherapy treatment to Dec 10 (assuming she has fully recovered by then).  Appreciate your help to this patient!  LOS: 2 days        Lowella Dell, MD 03/29/2011, 1:06 PM

## 2011-03-29 NOTE — Progress Notes (Signed)
Subjective: Doesn't feel like lesions are much better,LLQ abdominal wall abscess unchanged Rash over face and upper chest  Objective: Vital signs in last 24 hours: Temp:  [97.6 F (36.4 C)-97.7 F (36.5 C)] 97.7 F (36.5 C) (11/27 0700) Pulse Rate:  [77-101] 77  (11/27 0700) Resp:  [16-18] 18  (11/27 0700) BP: (99-121)/(66-71) 99/68 mmHg (11/27 0700) SpO2:  [96 %-98 %] 98 % (11/27 0700) Weight change:  Last BM Date: 03/27/11  Intake/Output from previous day: 11/26 0701 - 11/27 0700 In: 2320 [P.O.:720; I.V.:1200; IV Piggyback:400] Out: -     Physical Exam: General: Alert, awake, oriented x3, in no acute distress. HEENT: No bruits, no goiter. Heart: Regular rate and rhythm, without murmurs, rubs, gallops. Lungs: Clear to auscultation bilaterally. Abdomen: Soft, large area of erythema, induration with central I&D, no drainage otherwise nondistended, positive bowel sounds. Extremities: No clubbing cyanosis or edema with positive pedal pulses. Neuro: Grossly intact, nonfocal. Skin: multiple erythematous lesions though out legs, back Erythematous rash involving face and upper chest  Lab Results: Basic Metabolic Panel:  Basename 03/28/11 0810 03/27/11 1825  NA 138 136  K 3.1* 3.1*  CL 104 101  CO2 27 25  GLUCOSE 102* 98  BUN 7 9  CREATININE 0.77 0.83  CALCIUM 8.1* 8.8  MG -- --  PHOS -- --   Liver Function Tests: No results found for this basename: AST:2,ALT:2,ALKPHOS:2,BILITOT:2,PROT:2,ALBUMIN:2 in the last 72 hours No results found for this basename: LIPASE:2,AMYLASE:2 in the last 72 hours No results found for this basename: AMMONIA:2 in the last 72 hours CBC:  Basename 03/28/11 0810 03/27/11 1825  WBC 5.8 10.8*  NEUTROABS -- 9.1*  HGB 7.5* 9.4*  HCT 22.1* 26.9*  MCV 102.8* 100.0  PLT 109* 132*   Cardiac Enzymes: No results found for this basename: CKTOTAL:3,CKMB:3,CKMBINDEX:3,TROPONINI:3 in the last 72 hours BNP: No results found for this basename:  POCBNP:3 in the last 72 hours D-Dimer: No results found for this basename: DDIMER:2 in the last 72 hours CBG: No results found for this basename: GLUCAP:6 in the last 72 hours Hemoglobin A1C: No results found for this basename: HGBA1C in the last 72 hours Fasting Lipid Panel: No results found for this basename: CHOL,HDL,LDLCALC,TRIG,CHOLHDL,LDLDIRECT in the last 72 hours Thyroid Function Tests: No results found for this basename: TSH,T4TOTAL,FREET4,T3FREE,THYROIDAB in the last 72 hours Anemia Panel: No results found for this basename: VITAMINB12,FOLATE,FERRITIN,TIBC,IRON,RETICCTPCT in the last 72 hours Coagulation: No results found for this basename: LABPROT:2,INR:2 in the last 72 hours Urine Drug Screen: Drugs of Abuse     Component Value Date/Time   LABOPIA NONE DETECTED 06/24/2010 0034   COCAINSCRNUR NONE DETECTED 06/24/2010 0034   LABBENZ POSITIVE* 06/24/2010 0034   AMPHETMU NONE DETECTED 06/24/2010 0034   THCU NONE DETECTED 06/24/2010 0034   LABBARB  Value: NONE DETECTED        DRUG SCREEN FOR MEDICAL PURPOSES ONLY.  IF CONFIRMATION IS NEEDED FOR ANY PURPOSE, NOTIFY LAB WITHIN 5 DAYS.        LOWEST DETECTABLE LIMITS FOR URINE DRUG SCREEN Drug Class       Cutoff (ng/mL) Amphetamine      1000 Barbiturate      200 Benzodiazepine   200 Tricyclics       300 Opiates          300 Cocaine          300 THC              50 06/24/2010 0034    Alcohol  Level: No results found for this basename: ETH:2 in the last 72 hours  Recent Results (from the past 240 hour(s))  CULTURE, BLOOD (ROUTINE X 2)     Status: Normal (Preliminary result)   Collection Time   03/27/11  8:25 PM      Component Value Range Status Comment   Specimen Description BLOOD LEFT NECK  10 ML IN [facility] BOTTLE   Final    Special Requests NONE   Final    Setup Time [id number]   Final    Culture     Final    Value:        BLOOD CULTURE RECEIVED NO GROWTH TO DATE CULTURE WILL BE HELD FOR 5 DAYS BEFORE ISSUING A FINAL NEGATIVE REPORT    Report Status PENDING   Incomplete   CULTURE, BLOOD (ROUTINE X 2)     Status: Normal (Preliminary result)   Collection Time   03/27/11  8:35 PM      Component Value Range Status Comment   Specimen Description BLOOD PORTA CATH   Final    Special Requests BOTTLES DRAWN AEROBIC AND ANAEROBIC 5 CC EA   Final    Setup Time [id number]   Final    Culture     Final    Value:        BLOOD CULTURE RECEIVED NO GROWTH TO DATE CULTURE WILL BE HELD FOR 5 DAYS BEFORE ISSUING A FINAL NEGATIVE REPORT   Report Status PENDING   Incomplete   CULTURE, ROUTINE-ABSCESS     Status: Normal (Preliminary result)   Collection Time   03/28/11  2:39 AM      Component Value Range Status Comment   Specimen Description ABDOMEN   Final    Special Requests NONE   Final    Gram Stain     Final    Value: NO WBC SEEN     NO SQUAMOUS EPITHELIAL CELLS SEEN     FEW GRAM POSITIVE COCCI     IN PAIRS   Culture     Final    Value: MODERATE STAPHYLOCOCCUS AUREUS     Note: RIFAMPIN AND GENTAMICIN SHOULD NOT BE USED AS SINGLE DRUGS FOR TREATMENT OF STAPH INFECTIONS.   Report Status PENDING   Incomplete     Studies/Results: No results found.  Medications: Scheduled Meds:    . dexamethasone  8 mg Oral BID WC  . enoxaparin  40 mg Subcutaneous Q24H  . famotidine  20 mg Oral BID  .  HYDROmorphone (DILAUDID) injection  1 mg Intravenous Once  . polyethylene glycol  17 g Oral Daily  . potassium chloride  40 mEq Oral Once  . sodium chloride  10 mL Intracatheter Q12H  . vancomycin  1,000 mg Intravenous Q8H   Continuous Infusions:    . sodium chloride 100 mL/hr at 03/29/11 0245   PRN Meds:.acetaminophen, acetaminophen, ALPRAZolam, HYDROcodone-acetaminophen, morphine, ondansetron (ZOFRAN) IV, ondansetron, promethazine, sodium chloride, sodium chloride  Assessment/Plan: 1.Cellulitis and abscess: suspect MRSA related, Cultures with staph sensitivities pending Rash today, suspect Vancomycin related, will change to  [facility] Appreciate surgical evaluation,  possible repeat I&D today 2.Breast cancer: s/p B/L mastectomy on 5th cycle of chemotherapy 3. Seizure: stable off anti epileptics 4. Anemia due to chemo, if Hb drops below 7 will transfuse   LOS: 2 days   Andrey Mccaskill 03/29/2011, 11:14 AM

## 2011-03-29 NOTE — Progress Notes (Signed)
Seen and agree.  Overall improved  Mariella Saa MD, FACS  03/29/2011, 4:19 PM

## 2011-03-29 NOTE — Progress Notes (Signed)
ANTIBIOTIC CONSULT NOTE - INITIAL  Pharmacy Consult for Daptomycin Indication: Cellulitis with Abscess (now with Vanco allergy)  Allergies  Allergen Reactions  . Phenytoin Nausea And Vomiting  . ZOX:[id number] Acid+Aspartame Other (See Comments)    faint  . Thorazine (Chlorpromazine Hcl) Hives  . Augmentin Other (See Comments)    Syncope  . Ciprofloxacin Nausea And Vomiting  . Vancomycin Rash    Possible rash reported by MD    Patient Measurements: Height: 5\' 4"  (162.6 cm) Weight: 132 lb 4.4 oz (60 kg) IBW/kg (Calculated) : 54.7   Vital Signs: Temp: 97.7 F (36.5 C) (11/27 1425) BP: 110/70 mmHg (11/27 1425) Pulse Rate: 82  (11/27 1425) Intake/Output from previous day: 11/26 0701 - 11/27 0700 In: 2320 [P.O.:720; I.V.:1200; IV Piggyback:400] Out: -  Intake/Output from this shift: Total I/O In: 1160 [P.O.:360; I.V.:800] Out: 3 [Urine:3]  Labs:  [facility] 03/28/11 0810 03/27/11 1825  WBC 5.8 10.8*  HGB 7.5* 9.4*  PLT 109* 132*  LABCREA -- --  CREATININE 0.77 0.83   Estimated Creatinine Clearance: 86.4 ml/min (by C-G formula based on Cr of 0.77).  Basename 03/29/11 1020  VANCOTROUGH 14.2  VANCOPEAK --  VANCORANDOM --  GENTTROUGH --  GENTPEAK --  GENTRANDOM --  TOBRATROUGH --  TOBRAPEAK --  TOBRARND --  AMIKACINPEAK --  AMIKACINTROU --  AMIKACIN --     Microbiology: Recent Results (from the past 720 hour(s))  CULTURE, BLOOD (ROUTINE X 2)     Status: Normal (Preliminary result)   Collection Time   03/27/11  8:25 PM      Component Value Range Status Comment   Specimen Description BLOOD LEFT NECK  10 ML IN [facility] BOTTLE   Final    Special Requests NONE   Final    Setup Time [id number]   Final    Culture     Final    Value:        BLOOD CULTURE RECEIVED NO GROWTH TO DATE CULTURE WILL BE HELD FOR 5 DAYS BEFORE ISSUING A FINAL NEGATIVE REPORT   Report Status PENDING   Incomplete   CULTURE, BLOOD (ROUTINE X 2)     Status: Normal  (Preliminary result)   Collection Time   03/27/11  8:35 PM      Component Value Range Status Comment   Specimen Description BLOOD PORTA CATH   Final    Special Requests BOTTLES DRAWN AEROBIC AND ANAEROBIC 5 CC EA   Final    Setup Time [id number]   Final    Culture     Final    Value:        BLOOD CULTURE RECEIVED NO GROWTH TO DATE CULTURE WILL BE HELD FOR 5 DAYS BEFORE ISSUING A FINAL NEGATIVE REPORT   Report Status PENDING   Incomplete   CULTURE, ROUTINE-ABSCESS     Status: Normal (Preliminary result)   Collection Time   03/28/11  2:39 AM      Component Value Range Status Comment   Specimen Description ABDOMEN   Final    Special Requests NONE   Final    Gram Stain     Final    Value: NO WBC SEEN     NO SQUAMOUS EPITHELIAL CELLS SEEN     FEW GRAM POSITIVE COCCI     IN PAIRS   Culture     Final    Value: MODERATE STAPHYLOCOCCUS AUREUS     Note: RIFAMPIN AND GENTAMICIN SHOULD NOT BE USED AS SINGLE DRUGS FOR TREATMENT OF  STAPH INFECTIONS.   Report Status PENDING   Incomplete     Medical History: Past Medical History  Diagnosis Date  . Seizures     last seizure 2 years ago, diagnosed 3 years ago  . Allergy   . rt breast ca dx'd 10/2010  . Breast cancer 12/03/10    Medications:  Scheduled:    . dexamethasone  2 mg Oral Daily  . enoxaparin  40 mg Subcutaneous Q24H  . famotidine  20 mg Oral BID  .  HYDROmorphone (DILAUDID) injection  1 mg Intravenous Once  . polyethylene glycol  17 g Oral Daily  . sodium chloride  10 mL Intracatheter Q12H  . DISCONTD: dexamethasone  8 mg Oral BID WC  . DISCONTD: vancomycin  1,000 mg Intravenous Q8H   Infusions:    . sodium chloride 75 mL/hr at 03/29/11 1409   PRN: acetaminophen, acetaminophen, ALPRAZolam, HYDROcodone-acetaminophen, morphine, ondansetron (ZOFRAN) IV, ondansetron, promethazine, sodium chloride, sodium chloride  Assessment: 35 yo F with S. aureus abdominal abscess (sensitivites pending) and cellulitis. Was on Vanco,  developed a rash per Dr. Jomarie Longs who discussed the case with Dr. Ninetta Lights (ID), who has approved the decision to switch from Vanco to Daptomycin. Will dose Dapto at 4mg /kg for SSTI infection.  Plan:  1)  Daptomycin 240mg  IV q24h 2)  CPK levels q 7 days 3)  Add Vanco to patient's allergy list.  Annia Belt 03/29/2011,4:29 PM

## 2011-03-30 ENCOUNTER — Encounter (HOSPITAL_COMMUNITY): Payer: Self-pay | Admitting: Emergency Medicine

## 2011-03-30 LAB — CBC
HCT: 22.1 % — ABNORMAL LOW (ref 36.0–46.0)
MCH: 35.3 pg — ABNORMAL HIGH (ref 26.0–34.0)
MCV: 102.8 fL — ABNORMAL HIGH (ref 78.0–100.0)
RDW: 15.4 % (ref 11.5–15.5)
WBC: 6.3 10*3/uL (ref 4.0–10.5)

## 2011-03-30 LAB — CULTURE, ROUTINE-ABSCESS: Gram Stain: NONE SEEN

## 2011-03-30 MED ORDER — HEPARIN SOD (PORK) LOCK FLUSH 100 UNIT/ML IV SOLN
500.0000 [IU] | INTRAVENOUS | Status: AC | PRN
  Administered 2011-03-30: 500 [IU]

## 2011-03-30 MED ORDER — DEXAMETHASONE 4 MG PO TABS: 2.0000 mg | ORAL_TABLET | Freq: Once | ORAL | Status: DC

## 2011-03-30 MED ORDER — DOXYCYCLINE HYCLATE 100 MG PO TABS: 100.0000 mg | ORAL_TABLET | Freq: Two times a day (BID) | ORAL | Status: AC

## 2011-03-30 MED ORDER — DEXAMETHASONE 4 MG PO TABS: 2.0000 mg | ORAL_TABLET | Freq: Two times a day (BID) | ORAL | Status: DC

## 2011-03-30 NOTE — Procedures (Signed)
Mariella Saa MD, FACS  03/30/2011, 7:18 PM

## 2011-03-30 NOTE — Progress Notes (Signed)
[facility] RN for home health dressing changes and teaching. Pt states she can pay $4.00 for  Medication at [facility]. mp

## 2011-03-30 NOTE — Progress Notes (Signed)
Agree  Mariella Saa MD, FACS  03/30/2011, 7:18 PM

## 2011-03-30 NOTE — Progress Notes (Signed)
Patient ID: Crystal [name], female   DOB: May 07, 1975, 35 y.o.   MRN: [id number]    Subjective: Pt feeling much better today.  Less abd pain.  Lab just called to confirm culture as MRSA  Objective: Vital signs in last 24 hours: Temp:  [97.6 F (36.4 C)-97.7 F (36.5 C)] 97.7 F (36.5 C) (11/28 0605) Pulse Rate:  [77-82] 77  (11/28 0605) Resp:  [16-18] 16  (11/28 0605) BP: (107-112)/(70-72) 107/72 mmHg (11/28 0605) SpO2:  [98 %-100 %] 100 % (11/28 0605) Last BM Date: 03/29/11  Intake/Output from previous day: 11/27 0701 - 11/28 0700 In: 1580 [P.O.:480; I.V.:1100] Out: 2 [Stool:2] Intake/Output this shift:    PE: Abd: soft, I&D perfromed.  See procedure note.  Significant decrease in erythema.  Minimal tan drainage.  Lab Results:   [facility] 03/30/11 0532 03/28/11 0810  WBC 6.3 5.8  HGB 7.6* 7.5*  HCT 22.1* 22.1*  PLT 110* 109*   BMET  Basename 03/28/11 0810 03/27/11 1825  NA 138 136  K 3.1* 3.1*  CL 104 101  CO2 27 25  GLUCOSE 102* 98  BUN 7 9  CREATININE 0.77 0.83  CALCIUM 8.1* 8.8   PT/INR No results found for this basename: LABPROT:2,INR:2 in the last 72 hours   Studies/Results: No results found.  Anti-infectives: Anti-infectives     Start     Dose/Rate Route Frequency Ordered Stop   03/29/11 1800   DAPTOmycin (CUBICIN) 240 mg in sodium chloride 0.9 % IVPB        4 mg/kg  60 kg 209.6 mL/hr over 30 Minutes Intravenous Every 24 hours 03/29/11 1641     03/28/11 0300   vancomycin (VANCOCIN) IVPB 1000 mg/200 mL premix  Status:  Discontinued        1,000 mg 200 mL/hr over 60 Minutes Intravenous Every 8 hours 03/28/11 0224 03/29/11 1119   03/28/11 0133   cefTRIAXone (ROCEPHIN) 1 g in dextrose 5 % 50 mL IVPB  Status:  Discontinued        1 g 100 mL/hr over 30 Minutes Intravenous Every 24 hours 03/28/11 0133 03/28/11 1017   03/27/11 2000   cefTRIAXone (ROCEPHIN) 1 g in dextrose 5 % 50 mL IVPB        1 g 100 mL/hr over 30 Minutes Intravenous  Once  03/27/11 1953 03/27/11 2102   03/27/11 1830   vancomycin (VANCOCIN) IVPB 1000 mg/200 mL premix        1,000 mg 200 mL/hr over 60 Minutes Intravenous  Once 03/27/11 1800 03/27/11 1950           Assessment/Plan  1. MRSA abd wall abscess, s/p I&D   Plan: Ok to d/c home today from our standpoint with po abx to cover MRSA Will have her f/u with [place] in clinic in 1-2 weeks. Will arrange [facility] for dressing changes.  LOS: 3 days    Trelyn Vanderlinde E 03/30/2011

## 2011-03-30 NOTE — Procedures (Signed)
Incision and Drainage Procedure Note  Pre-operative Diagnosis: abdominal wall abscess, s/p I&D several days prior  Post-operative Diagnosis: same  Indications: opening of wound was quite small with some continued purulent drainage  Anesthesia: 2% plain lidocaine  Procedure Details  The procedure, risks and complications have been discussed in detail (including, but not limited to airway compromise, infection, bleeding) with the patient.  The skin was sterilely prepped and draped over the affected area in the usual fashion. After adequate local anesthesia, I&D with a #11 blade was performed on the left, lower quadrant of the abdominal wall. Purulent drainage: absent.  The cavity was probed with a hemostat.  No tunneling or undermining noted.  Hemostasis was achieved and the wound was packed. The patient was observed until stable.  EBL: 3 cc's  Drains: none  Condition: Tolerated procedure well and Stable   Complications: none.  Barnetta Chapel, PA-C 8:24am, 03-30-11

## 2011-03-30 NOTE — ED Provider Notes (Signed)
Procedures preformed by myself:  INCISION AND DRAINAGE Performed by: Raeford Razor Consent: Verbal consent obtained. Risks and benefits: risks, benefits and alternatives were discussed Type: abscess  Body area: abdominal wall  Anesthesia: local infiltration  Local anesthetic: lidocaine 2% w/epinephrine  Anesthetic total: 3 ml  Complexity: complex Blunt dissection to break up loculations  Drainage: purulent  Drainage amount: 4cc  Packing material: 1/4 in iodoform gauze  Patient tolerance: Patient tolerated the procedure well with no immediate complications.    INCISION AND DRAINAGE Performed by: Raeford Razor Consent: Verbal consent obtained. Risks and benefits: risks, benefits and alternatives were discussed Type: abscess  Body area: L wrist  Anesthesia: local infiltration  Local anesthetic: lidocaine 2% w/ epinephrine  Anesthetic total: 1 ml  Complexity: complex Blunt dissection to break up loculations  Drainage: purulent  Drainage amount: 2cc  Packing material: 1/4 in iodoform gauze  Patient tolerance: Patient tolerated the procedure well with no immediate complications.   ANGIOCATH INSERTION Performed by: Raeford Razor Consent: Verbal consent obtained. Risks and benefits: risks, benefits and alternatives were discussed Type: Angiocath insertion  Indication: Need for additional IV access in anticipation of potential HD compromise. Pt with mastectomies and requesting UEs not be cannulated.  Body area: L external jugular  Anesthesia: none  Prep: Area cleaned with chloraprep   18g angiocatheter inserted into L External Jugular vein in usual fashion. Placement confirmed with aspiration of dark red blood and easily flushed without extravization.    Patient tolerance: Patient tolerated the procedure well with no immediate complications.   Medical screening examination/treatment/procedure(s) were conducted as a shared visit with non-physician  practitioner(s) and myself.  I personally evaluated the patient during the encounter.  Multiple skin abscess and cellulitis anterior abdominal wall in immunocompromised pt. Procedures per above. Dose vanc and zosyn then added given rapid progression. No evidence of nec fasc though such as pain out of proportion, bullae, crepitus. Appropriate resistance of subcutaneous tissues with I&D and breaking up locations. Admit for further tx and eval.      Raeford Razor, MD 03/30/11 [phone]

## 2011-03-30 NOTE — Discharge Summary (Signed)
Patient ID: [name] MRN: [id number] DOB/AGE: 05/27/1975 35 y.o.  Admit date: 03/27/2011 Discharge date: 03/30/2011  Primary Care Physician:  [name], MD   Discharge Diagnoses:    Present on Admission:  .Abdominal wall MRSA Cellulitis and abscess *Multiple skin lesions  *.Hypokalemia .Breast cancer S/P bilateral mastectomy  .History of Seizure disorder  .Anemia associated with chemotherapy  Current Discharge Medication List    START taking these medications   Details  doxycycline (VIBRA-TABS) 100 MG tablet Take 1 tablet (100 mg total) by mouth 2 (two) times daily. Qty: 20 tablet, Refills: 0      CONTINUE these medications which have CHANGED   Details  dexamethasone (DECADRON) 4 MG tablet Take 0.5 tablets (2 mg total) by mouth once  with a meal.take once tomorrow then stop Qty: 1 tablet, Refills: 0      CONTINUE these medications which have NOT CHANGED   Details  ALPRAZolam (XANAX) 0.5 MG tablet Take 0.5 mg by mouth 3 (three) times daily as needed. anxiety    famotidine (PEPCID) 20 MG tablet Take 20 mg by mouth 2 (two) times daily.     lidocaine-prilocaine (EMLA) cream Apply topically as needed. Apply to port as needed    polyethylene glycol (MIRALAX / GLYCOLAX) packet Take 17 g by mouth daily. constipation     promethazine (PHENERGAN) 25 MG tablet Take 25 mg by mouth every 6 (six) hours as needed. nausea    tobramycin-dexamethasone (TOBRADEX) ophthalmic ointment Place 1 application into both eyes 2 (two) times daily. Pt uses after chemo for blurred vision     traMADol (ULTRAM) 50 MG tablet Take 50 mg by mouth every 6 (six) hours as needed. pain      STOP taking these medications     naproxen sodium (ANAPROX) 220 MG tablet          Consults:   [facility] Oncology   Procedure: I&D of abdominal wall abcess 11/28   Significant Diagnostic Studies:   Brief H and P: For complete details please refer to admission H and P, but in brief  This is an  unfortunate 35 year old female who was diagnosed with right-sided breast cancer June 2012, she had bilateral mastectomy in July 2012 and has been on chemotherapy since August 2012. She appears to be tolerating every 3 weekly chemotherapy well. On Monday she developed a small pimple on the left brow, she picked it and it started bleeding. On Tuesday she developed another on the right brow, since then she's had these pustulous pimples developing all over her body. She is developing significant cellulitis her on some. The rapidity of the developing cellulitis is increasing. She reports no fevers or chills, no nausea no vomiting. She came to the ER. In the ER cellulitic area on the left abdomen and the left wrist were I&D and cultured. Patient has no history of prior skin infections. History obtained from patient and husband.   Hospital Course:  Principal Problem:  *Abdominal wall Cellulitis and abscess Multiple skin erythematous lesions Patient was inially treated with vancomycin ,blood culture and wound culture sent .She was seen by [facility] and underwent I$D of LLQ abdominal abcess .Patient developed skin rash with vancomycin ,antibiotics was subsequently  Changed to Cubicin  .  Culture grew MRSA ,blood culture showed no growth so far,final report pending please follow  .She received 2 days of IV antibiotics,will d/c on doxycycline .Will order [facility] for dressing change .Home wound care instructions are provided. Active Problems:  Breast cancer S/P B/L  mastectomy  Currently on chemotherapy .seen by Dr Bevelyn Buckles this admission who recommended to taper steroids off ,f/u in the office 12/3  Hypokalemia : repleted repeat labs today showed K level of 3.5 ,repeat labs  With PCP/oncology   Seizure No acute issues   Anemia associated with chemotherapy Low Hb at 7.6 ,repeat labs in next clinic visit    Subjective: Patient seen and examined,feeling better ,denies any complaints  And want to go home   Filed  Vitals:   03/30/11 0605  BP: 107/72  Pulse: 77  Temp: 97.7 F (36.5 C)  Resp: 16    General: Alert, awake, oriented x3, in no acute distress. Heart: Regular rate and rhythm, without murmurs, rubs, gallops. Lungs: Clear to auscultation bilaterally. Abdomen: Soft, nontender, nondistended, positive bowel sounds.LLQ dressing in place . Extremities: No clubbing cyanosis or edema with positive pedal pulses.  Disposition and Follow-up: To home  Follow with PCP,surgery and oncology as instructed  Wound care as instructed   Time spent on Discharge: 50 minutes    Signed: Maanya Hippert 03/30/2011, 11:45 AM

## 2011-03-31 ENCOUNTER — Telehealth: Payer: Self-pay | Admitting: *Deleted

## 2011-03-31 ENCOUNTER — Other Ambulatory Visit: Payer: Self-pay | Admitting: Oncology

## 2011-03-31 MED ORDER — HYDROCODONE-ACETAMINOPHEN 7.5-500 MG PO TABS: 1.0000 | ORAL_TABLET | Freq: Two times a day (BID) | ORAL | Status: DC | PRN

## 2011-03-31 NOTE — Telephone Encounter (Signed)
Pt called to this RN to state she was inpatient recently for an infection per MRSA and now has an open wound. Pt is having dressing change BID with packing of wound.  Per pt she was d/ced with instructions to use pain medication per Dr [name] which is  Tramadol.  With dressing changes Tramadol is not effective in controlling pain- and [place] is requesting " just something different that would be better "  Note [place] states she " only wants about 10 tablets to last until wound care done "  Pt uses [facility] on [address].  This note will be reviewed with MD.

## 2011-04-01 ENCOUNTER — Other Ambulatory Visit: Payer: Self-pay | Admitting: Nurse Practitioner

## 2011-04-03 LAB — CULTURE, BLOOD (ROUTINE X 2)
Culture  Setup Time: 201211260100
Culture: NO GROWTH

## 2011-04-04 ENCOUNTER — Other Ambulatory Visit (HOSPITAL_BASED_OUTPATIENT_CLINIC_OR_DEPARTMENT_OTHER): Payer: Self-pay | Admitting: Lab

## 2011-04-04 ENCOUNTER — Encounter: Payer: Self-pay | Admitting: Physician Assistant

## 2011-04-04 ENCOUNTER — Telehealth: Payer: Self-pay | Admitting: *Deleted

## 2011-04-04 ENCOUNTER — Other Ambulatory Visit: Payer: Self-pay | Admitting: Oncology

## 2011-04-04 ENCOUNTER — Ambulatory Visit: Payer: Self-pay

## 2011-04-04 ENCOUNTER — Ambulatory Visit (HOSPITAL_BASED_OUTPATIENT_CLINIC_OR_DEPARTMENT_OTHER): Payer: Self-pay | Admitting: Physician Assistant

## 2011-04-04 VITALS — BP 123/78 | HR 112 | Temp 98.2°F | Ht 64.0 in | Wt 139.2 lb

## 2011-04-04 DIAGNOSIS — C773 Secondary and unspecified malignant neoplasm of axilla and upper limb lymph nodes: Secondary | ICD-10-CM

## 2011-04-04 DIAGNOSIS — C50419 Malignant neoplasm of upper-outer quadrant of unspecified female breast: Secondary | ICD-10-CM

## 2011-04-04 DIAGNOSIS — C50919 Malignant neoplasm of unspecified site of unspecified female breast: Secondary | ICD-10-CM

## 2011-04-04 DIAGNOSIS — A4902 Methicillin resistant Staphylococcus aureus infection, unspecified site: Secondary | ICD-10-CM

## 2011-04-04 LAB — CBC WITH DIFFERENTIAL/PLATELET
EOS%: 0.2 % (ref 0.0–7.0)
MCHC: 34.6 g/dL (ref 31.5–36.0)
MCV: 103.1 fL — ABNORMAL HIGH (ref 79.5–101.0)
NEUT%: 65.9 % (ref 38.4–76.8)
Platelets: 221 10*3/uL (ref 145–400)
RBC: 2.93 10*6/uL — ABNORMAL LOW (ref 3.70–5.45)

## 2011-04-04 LAB — COMPREHENSIVE METABOLIC PANEL
ALT: 20 U/L (ref 0–35)
AST: 18 U/L (ref 0–37)
Albumin: 3.5 g/dL (ref 3.5–5.2)
BUN: 16 mg/dL (ref 6–23)
CO2: 24 mEq/L (ref 19–32)
Calcium: 9 mg/dL (ref 8.4–10.5)
Chloride: 105 mEq/L (ref 96–112)
Potassium: 4 mEq/L (ref 3.5–5.3)

## 2011-04-04 MED ORDER — HYDROCODONE-ACETAMINOPHEN 5-500 MG PO CAPS: 2.0000 | ORAL_CAPSULE | Freq: Two times a day (BID) | ORAL | Status: AC | PRN

## 2011-04-04 NOTE — Telephone Encounter (Signed)
gave patient appointment for labs and chemo and injection printed out calendar and gave to the patient

## 2011-04-04 NOTE — Progress Notes (Signed)
Hematology and Oncology Follow Up Visit  [name] [id number] 08/30/1975 35 y.o. 04/04/2011 12:21 PM   Interim History:   Patient returns today for followup, due for her sixth of 6 planned doses of docetaxel, carboplatin, and trastuzumab, being given in the adjuvant setting for right breast carcinoma. She receives Neulasta on day 2 for granulocyte support. Of course she is on oral dexamethasone with each cycle of chemotherapy. Interval history is remarkable for recent hospitalization secondary to a MRSA infection. She had multiple lesions, including the risks, her left eyebrow, and left lower quadrant of the abdomen. The one on the abdomen is actually been the most painful and in fact the patient is packing and dressing this wound twice daily. This is the area that is continuing to cause her the most discomfort. Fortunately she has had no fevers, chills, or night sweats.  At this point, pain is really the patient's biggest complaint. She tells me that when changing the dressing, the wound in the left lower quadrant the abdomen occasionally reaches a 6 or 7 on a scale of 1-10. She typically utilizes tramadol for pain, and normally this is sufficient. However, with this new wound, she finds that hydrocodone and APAP is more effective. She was prescribed this medication last week, initially prescribed at one tablet twice daily. Her home health nurse actually recommended that she increase this to 2 tablets twice daily, coordinated with her dressing changes, and we will rewrite the prescription accordingly today. Again this will be for hydrocodone/APAP, 5/500 mg, 2 tablets by mouth twice a day when necessary pain. We will write for 30 tablets, enough for 7 days.  Otherwise, the patient continues to be very fatigued. She also continues to be a little anxious on presentation.  A detailed review of systems is otherwise noncontributory as noted below.  Review of Systems: Constitutional:  Fatigue, no  weight loss, fever, night sweats Eyes: negative NWG:[id number] Cardiovascular: no chest pain or dyspnea on exertion Respiratory: no cough, shortness of breath, or wheezing Neurological: negative Dermatological: positive for healing skin lesions on the wrists/forearms, left eyebrow, and LLQ abdomen Gastrointestinal: no abdominal pain, change in bowel habits, or black or bloody stools Genito-Urinary: no dysuria, trouble voiding, or hematuria Hematological and Lymphatic: negative Breast: negative Musculoskeletal: positive for - pain in LLQ due to skin lesions Remaining ROS negative.  Medications: I have reviewed the patient's current medications.  Allergies:  Allergies  Allergen Reactions  . Phenytoin Nausea And Vomiting  . MVH:QIONGEXBMWU+XLKGMWNUU+VOZDGUYQIH Acid+Aspartame Other (See Comments)    faint  . Thorazine (Chlorpromazine Hcl) Hives  . Augmentin Other (See Comments)    Syncope  . Ciprofloxacin Nausea And Vomiting  . Vancomycin Rash    Possible rash reported by MD     Physical Exam:  Blood pressure 123/78, pulse 112, temperature 98.2 F (36.8 C), temperature source Oral, height 5\' 4"  (1.626 m), weight 139 lb 3.2 oz (63.141 kg). HEENT:  Sclerae anicteric, conjunctivae pink.  Oropharynx clear.  No mucositis or candidiasis.  Nodes:  No cervical, supraclavicular, or axillary lymphadenopathy palpated.  Breast Exam:  Right breast is benign.  No masses, discharge, skin change, or nipple inversion.  Left breast is benign.  No masses, discharge, skin change, or nipple inversion..  Lungs:  Clear to auscultation bilaterally.  No crackles, rhonchi, or wheezes.  Heart:  Regular rate and rhythm.  Abdomen:  Soft, nontender.  Positive bowel sounds.  No organomegaly or masses palpated.  Musculoskeletal:  No focal spinal tenderness to palpation.  Extremities:  Benign.  No peripheral edema or cyanosis.  Skin:  Benign.  Neuro:  Nonfocal.   Lab Results: Lab Results  Component Value Date   WBC  6.8 04/04/2011   HGB 10.5* 04/04/2011   HCT 30.3* 04/04/2011   MCV 103.1* 04/04/2011   PLT 221 04/04/2011   NEUTROABS 4.5 04/04/2011     Chemistry      Component Value Date/Time   NA 138 03/28/2011 0810   K 3.5 03/30/2011 1200   CL 104 03/28/2011 0810   CO2 27 03/28/2011 0810   BUN 7 03/28/2011 0810   CREATININE 0.77 03/28/2011 0810      Component Value Date/Time   CALCIUM 8.1* 03/28/2011 0810   ALKPHOS 68 03/14/2011 0841   AST 15 03/14/2011 0841   ALT 28 03/14/2011 0841   BILITOT 0.2* 03/14/2011 0841         Impression and Plan: 35 year old [place] woman, known to be BRCA1 and 2 negative, status post bilateral mastectomies May of 2012 for a right-sided T1c N1 (stage II) grade 3 invasive ductal carcinoma,estrogen and progesterone receptor positive, HER-2 amplified, with an MIB-1-1 of 85%. Currently due for sixth and final dose of docetaxel, carboplatin, trastuzumab being given in the adjuvant setting with Neulasta on day 2 for granulocyte support. Status post recent hospitalization for MRSA skin infection. Sixth dose of chemotherapy is being held today accordingly.  Per Dr. [name] plan, we will hold the patient's sixth and final dose of chemotherapy today, and we'll give her one extra week to recover status post recent hospitalization. She'll continue to change her dressings twice daily as instructed, and her home health nurse we'll continue to supervise and assess. As noted above we are going to increase her pain medication slightly, hydrocodone/APAP, 5/500 mg, with instructions to take 1-2 by mouth every 12 hours as needed for pain. She is to coordinate her pain medication with her dressing changes.  I will see the patient again next week for followup on December 10 prior to her sixth and final dose of chemotherapy the same day. She'll receive Neulasta on day 2, December 11, and return the following week on December 17 for assessment of chemotoxicity. She re: has appointment to  followup with radiation oncology as well.  This plan was reviewed with the patient, who voices understanding and agreement.  She knows to call with any changes or problems.    Jensen Cheramie, PA-C 12/3/201212:21 PM

## 2011-04-05 ENCOUNTER — Ambulatory Visit: Payer: Self-pay

## 2011-04-06 ENCOUNTER — Telehealth: Payer: Self-pay | Admitting: Oncology

## 2011-04-06 NOTE — Telephone Encounter (Signed)
pt called and r/s aptp time for 12/11 to 9:15am

## 2011-04-08 ENCOUNTER — Telehealth: Payer: Self-pay

## 2011-04-08 NOTE — Telephone Encounter (Signed)
Received call from pt stating that Zollie Scale, PA informed her to call back on Friday to get a refill on her hydrocodone, because pt states she is trying to get things ready ahead of time, with her last chemo treatment being on Monday.  Spoke with Debbora Presto, PA, who states pt can wait until she is seen on Monday, and MD can refill at that time.  Called pt back [phone] and informed her of this, and she verbalizes understanding.

## 2011-04-11 ENCOUNTER — Ambulatory Visit (HOSPITAL_BASED_OUTPATIENT_CLINIC_OR_DEPARTMENT_OTHER): Payer: Self-pay

## 2011-04-11 ENCOUNTER — Ambulatory Visit (HOSPITAL_BASED_OUTPATIENT_CLINIC_OR_DEPARTMENT_OTHER): Payer: Self-pay | Admitting: Oncology

## 2011-04-11 ENCOUNTER — Other Ambulatory Visit: Payer: Self-pay | Admitting: Oncology

## 2011-04-11 ENCOUNTER — Other Ambulatory Visit (HOSPITAL_BASED_OUTPATIENT_CLINIC_OR_DEPARTMENT_OTHER): Payer: Self-pay | Admitting: Lab

## 2011-04-11 VITALS — BP 119/85 | HR 73 | Temp 98.5°F | Ht 64.0 in | Wt 133.6 lb

## 2011-04-11 DIAGNOSIS — Z5112 Encounter for antineoplastic immunotherapy: Secondary | ICD-10-CM

## 2011-04-11 DIAGNOSIS — R52 Pain, unspecified: Secondary | ICD-10-CM

## 2011-04-11 DIAGNOSIS — C50919 Malignant neoplasm of unspecified site of unspecified female breast: Secondary | ICD-10-CM

## 2011-04-11 DIAGNOSIS — C773 Secondary and unspecified malignant neoplasm of axilla and upper limb lymph nodes: Secondary | ICD-10-CM

## 2011-04-11 DIAGNOSIS — C50419 Malignant neoplasm of upper-outer quadrant of unspecified female breast: Secondary | ICD-10-CM

## 2011-04-11 DIAGNOSIS — Z5111 Encounter for antineoplastic chemotherapy: Secondary | ICD-10-CM

## 2011-04-11 DIAGNOSIS — Z170 Estrogen receptor positive status [ER+]: Secondary | ICD-10-CM

## 2011-04-11 LAB — COMPREHENSIVE METABOLIC PANEL
ALT: 45 U/L — ABNORMAL HIGH (ref 0–35)
AST: 23 U/L (ref 0–37)
Albumin: 4.1 g/dL (ref 3.5–5.2)
Calcium: 8.8 mg/dL (ref 8.4–10.5)
Chloride: 107 mEq/L (ref 96–112)
Potassium: 3.8 mEq/L (ref 3.5–5.3)
Total Protein: 6 g/dL (ref 6.0–8.3)

## 2011-04-11 LAB — CBC WITH DIFFERENTIAL/PLATELET
BASO%: 0 % (ref 0.0–2.0)
EOS%: 0.1 % (ref 0.0–7.0)
HGB: 10.5 g/dL — ABNORMAL LOW (ref 11.6–15.9)
MCH: 35.7 pg — ABNORMAL HIGH (ref 25.1–34.0)
MCHC: 34.8 g/dL (ref 31.5–36.0)
MONO#: 0.1 10*3/uL (ref 0.1–0.9)
RDW: 16.7 % — ABNORMAL HIGH (ref 11.2–14.5)
WBC: 9.6 10*3/uL (ref 3.9–10.3)
lymph#: 0.6 10*3/uL — ABNORMAL LOW (ref 0.9–3.3)

## 2011-04-11 MED ORDER — ONDANSETRON 16 MG/50ML IVPB (CHCC)
16.0000 mg | Freq: Once | INTRAVENOUS | Status: AC
  Administered 2011-04-11: 16 mg via INTRAVENOUS

## 2011-04-11 MED ORDER — SODIUM CHLORIDE 0.9 % IV SOLN
660.0000 mg | Freq: Once | INTRAVENOUS | Status: AC
  Administered 2011-04-11: 660 mg via INTRAVENOUS
  Filled 2011-04-11: qty 66

## 2011-04-11 MED ORDER — SODIUM CHLORIDE 0.9 % IV SOLN
Freq: Once | INTRAVENOUS | Status: AC
  Administered 2011-04-11: 15:00:00 via INTRAVENOUS

## 2011-04-11 MED ORDER — TRASTUZUMAB CHEMO INJECTION 440 MG
6.0000 mg/kg | Freq: Once | INTRAVENOUS | Status: AC
  Administered 2011-04-11: 378 mg via INTRAVENOUS
  Filled 2011-04-11: qty 18

## 2011-04-11 MED ORDER — ACETAMINOPHEN 325 MG PO TABS
650.0000 mg | ORAL_TABLET | Freq: Once | ORAL | Status: AC
  Administered 2011-04-11: 650 mg via ORAL

## 2011-04-11 MED ORDER — OXYCODONE-ACETAMINOPHEN 5-325 MG PO TABS
1.0000 | ORAL_TABLET | Freq: Once | ORAL | Status: AC
  Administered 2011-04-11: 1 via ORAL

## 2011-04-11 MED ORDER — SODIUM CHLORIDE 0.9 % IJ SOLN
10.0000 mL | INTRAMUSCULAR | Status: DC | PRN
  Administered 2011-04-11: 10 mL
  Filled 2011-04-11: qty 10

## 2011-04-11 MED ORDER — DOCETAXEL CHEMO INJECTION 160 MG/16ML
75.0000 mg/m2 | Freq: Once | INTRAVENOUS | Status: AC
  Administered 2011-04-11: 130 mg via INTRAVENOUS
  Filled 2011-04-11: qty 13

## 2011-04-11 MED ORDER — HEPARIN SOD (PORK) LOCK FLUSH 100 UNIT/ML IV SOLN
500.0000 [IU] | Freq: Once | INTRAVENOUS | Status: AC | PRN
  Administered 2011-04-11: 500 [IU]
  Filled 2011-04-11: qty 5

## 2011-04-11 MED ORDER — DIPHENHYDRAMINE HCL 25 MG PO CAPS
25.0000 mg | ORAL_CAPSULE | Freq: Once | ORAL | Status: AC
  Administered 2011-04-11: 25 mg via ORAL

## 2011-04-11 MED ORDER — HYDROCODONE-ACETAMINOPHEN 5-500 MG PO CAPS: 1.0000 | ORAL_CAPSULE | ORAL | Status: AC

## 2011-04-11 MED ORDER — DEXAMETHASONE SODIUM PHOSPHATE 4 MG/ML IJ SOLN
20.0000 mg | Freq: Once | INTRAMUSCULAR | Status: AC
  Administered 2011-04-11: 20 mg via INTRAVENOUS

## 2011-04-11 NOTE — Progress Notes (Signed)
ID: Crystal [name]  HPI: The patient is a 35 year old [place] woman who noted a mass in her right breast and brought it to her primary care physician's attention June of 2012. She was set up for mammography at [name], where an area of calcifications highly suspicious for carcinoma was biopsied. This was in the upper outer quadrant and it measured 1.7 cm mammographically and 1.8 cm by ultrasonography. The pathology report ([id number]) showed a high-grade invasive ductal carcinoma estrogen receptor positive at 64% progesterone receptor positive at 18%, and HER-2 amplified with a ratio by CISH of 5.24. MIB-1-1 was 65%. A second mass biopsied at the same time was also triple positive.  Given her multiple masses in the right breast, mastectomy was recommended. The patient actually opted for bilateral mastectomies and this was performed together with a right axillary lymph node dissection under Dr. Felicity [name] on 11/16/2010. The final pathology [id number]) showed, on the left, no evidence of cancer. On the right the patient had a high-grade invasive ductal carcinoma measuring 1.9 cm, with negative margins, grade 3, involving 2 of 16 lymph nodes (stage IIB).  The patient is being treated adjuvantly with chemotherapy as listed below.  Interval History:   Since her last visit here, the patient had an admission to [name] for multiple skin lesions which proved to be MRSA. She was treated initially with vancomycin, later maintained on doxycycline, which she just completed 2 days ago. She has one area in the left groin which is healing by secondary intention and she has home health helping her with this.   ROS:  The lesion in the left groin area is causing her significant pain. She received hydrocodone/APAP 5/500 and she is taking this twice a day with good pain control. It is constipating her. She is also taking tramadol although it doesn't work as well for this particular pain. She continues to be  quite fatigued, having a bit of around the nose, gets aches and pains after the Neulasta but of course that is temporary, and has had minimal numbness in the fingers, which is not constant. She remains anxious and is having hot flashes. Last menstrual period was 01/26/2011. A detailed review of systems was otherwise noncontributory.  Medications: I have reviewed the patient's current medications.   Current Outpatient Prescriptions  Medication Sig Dispense Refill  . ALPRAZolam (XANAX) 0.5 MG tablet Take 0.5 mg by mouth 3 (three) times daily as needed. anxiety      . dexamethasone (DECADRON) 4 MG tablet Take 0.5 tablets (2 mg total) by mouth once.  1 tablet  0  . famotidine (PEPCID) 20 MG tablet Take 20 mg by mouth 2 (two) times daily.       . hydrocodone-acetaminophen (LORCET-HD) 5-500 MG per capsule Take 2 capsules by mouth every 12 (twelve) hours as needed for pain.  30 capsule  0  . lidocaine-prilocaine (EMLA) cream Apply topically as needed. Apply to port as needed      . polyethylene glycol (MIRALAX / GLYCOLAX) packet Take 17 g by mouth daily. constipation       . promethazine (PHENERGAN) 25 MG tablet Take 25 mg by mouth every 6 (six) hours as needed. nausea      . tobramycin-dexamethasone (TOBRADEX) ophthalmic ointment Place 1 application into both eyes 2 (two) times daily. Pt uses after chemo for blurred vision       . traMADol (ULTRAM) 50 MG tablet Take 50 mg by mouth every 6 (six) hours as needed.  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 Her husband of 4 years, [name], works as a Administrator.. In addition to the couple at home is the patient's son Crystal [name] who is 21 years old and at [employer], 10th grade. The patient attends the [name]. Crystal [name] is her pastor.  Objective: Vital signs in last 24 hours: BP 119/85  Pulse 73  Temp 98.5 F (36.9 C)  Ht 5\' 4"  (1.626 m)  Wt 133 lb 9.6 oz (60.601 kg)  BMI 22.93 kg/m2   Physical Exam:    Sclerae unicteric  Oropharynx clear  No peripheral adenopathy  Lungs clear -- no rales or rhonchi  Heart regular rate and rhythm  Abdomen benign  MSK no focal spinal tenderness, no peripheral edema  Neuro nonfocal  Breast exam: Status post bilateral mastectomies. Exam today was deferred.  Lab Results:   CMP  Lab Results  Component Value Date   GLUCOSE 115* 03/14/2011   ALT 28 03/14/2011   AST 15 03/14/2011   NA 139 03/14/2011   K 3.8 03/14/2011   CL 106 03/14/2011   CREATININE 0.88 03/14/2011   BUN 19 03/14/2011   CO2 24 03/14/2011   TSH  Value: 4.066  07/21/2009   INR 1.57* 03/01/2009     Lab Results  Component Value Date   WBC 9.6 04/11/2011   HGB 10.5* 04/11/2011   HCT 30.1* 04/11/2011   MCV 102.8* 04/11/2011   PLT 305 04/11/2011        Studies/Results: No new results found. Most recent echocardiogram was November 2012.  Assessment:  35 year old [place] woman, known to be BRCA1 and 2 negative,   (1) status post bilateral mastectomies May of 2012 for a right-sided T1c N1 (stage II) grade 3 invasive ductal carcinoma,estrogen and progesterone receptor positive, HER-2 amplified, with an MIB-1-1 of 85%.  (2) carboplatin/docetaxol/trastuzumab x6, to be completed today   Plan: I think her skin lesions are healing quite well and it is safe to proceed with her final chemotherapy cycle today. Just in case, I went ahead and wrote her for doxycycline to take 100 mg daily for the next 10 days and that will carry her through her period of borderline neutropenia.  I also refilled her hydrocodone, giving her 60 tablets to take twice a day as needed for pain.  We had a long discussion regarding this parauterine and and loss of libido. I really don't  anticipate any improvement in the loss of libido until she is several months out from her radiation treatments. I am hoping the disparity in you can improve [place]and once she is on tamoxifen, after completion of her radiation, she will be able to utilize Vagifem suppositories or EST RI NG for optimal vaginal health.   She will need her next echocardiogram in February and we will set her up with Dr. Bethanne Ginger around that time. We're going to hold off on trastuzumab on until she returns in January. She really has an appointment with Dr. Michell Heinrich to plan out the radiation therapy.   [name],[name] 04/11/2011

## 2011-04-12 ENCOUNTER — Encounter (INDEPENDENT_AMBULATORY_CARE_PROVIDER_SITE_OTHER): Payer: Self-pay

## 2011-04-12 ENCOUNTER — Ambulatory Visit (HOSPITAL_BASED_OUTPATIENT_CLINIC_OR_DEPARTMENT_OTHER): Payer: Self-pay

## 2011-04-12 ENCOUNTER — Ambulatory Visit: Payer: Self-pay

## 2011-04-12 ENCOUNTER — Ambulatory Visit (INDEPENDENT_AMBULATORY_CARE_PROVIDER_SITE_OTHER): Payer: Self-pay | Admitting: General Surgery

## 2011-04-12 VITALS — BP 118/74 | HR 68 | Temp 98.6°F | Resp 14 | Ht 64.0 in | Wt 134.6 lb

## 2011-04-12 VITALS — BP 113/80 | HR 68 | Temp 98.3°F

## 2011-04-12 DIAGNOSIS — L02211 Cutaneous abscess of abdominal wall: Secondary | ICD-10-CM

## 2011-04-12 DIAGNOSIS — C50919 Malignant neoplasm of unspecified site of unspecified female breast: Secondary | ICD-10-CM

## 2011-04-12 DIAGNOSIS — C50419 Malignant neoplasm of upper-outer quadrant of unspecified female breast: Secondary | ICD-10-CM

## 2011-04-12 DIAGNOSIS — L02219 Cutaneous abscess of trunk, unspecified: Secondary | ICD-10-CM

## 2011-04-12 MED ORDER — PEGFILGRASTIM INJECTION 6 MG/0.6ML
6.0000 mg | Freq: Once | SUBCUTANEOUS | Status: AC
  Administered 2011-04-12: 6 mg via SUBCUTANEOUS
  Filled 2011-04-12: qty 0.6

## 2011-04-12 NOTE — Progress Notes (Signed)
[name]  She presents today s/p I&D at the bedside 2 weeks ago.  She is doing well.  C/o some burning around the wound, but otherwise feels much better.  PE: Abd: soft, NT, ND, small 1cm opening in LLQ that is only .25cm deep.  This is clean with good granulation tissue  A/P 1. S/p I&D of LLQ abdominal wall abscess  Plan: She will continue only once a day dressing changes.  She will follow up in 2-3 weeks to re-evaluate her wound.

## 2011-04-12 NOTE — Patient Instructions (Signed)
Continue packing until it become too shallow to pack

## 2011-04-12 NOTE — Patient Instructions (Signed)
Call MD for problems 

## 2011-04-13 ENCOUNTER — Encounter: Payer: Self-pay | Admitting: *Deleted

## 2011-04-13 ENCOUNTER — Encounter: Payer: Self-pay | Admitting: Radiation Oncology

## 2011-04-13 ENCOUNTER — Ambulatory Visit
Admission: RE | Admit: 2011-04-13 | Discharge: 2011-04-13 | Disposition: A | Discharge status: Home / Self Care | Payer: Self-pay | Source: Ambulatory Visit | Attending: Radiation Oncology | Admitting: Radiation Oncology

## 2011-04-13 VITALS — BP 122/84 | HR 84 | Temp 97.7°F | Ht 64.0 in | Wt 136.8 lb

## 2011-04-13 DIAGNOSIS — F3289 Other specified depressive episodes: Secondary | ICD-10-CM | POA: Insufficient documentation

## 2011-04-13 DIAGNOSIS — Y842 Radiological procedure and radiotherapy as the cause of abnormal reaction of the patient, or of later complication, without mention of misadventure at the time of the procedure: Secondary | ICD-10-CM | POA: Insufficient documentation

## 2011-04-13 DIAGNOSIS — F329 Major depressive disorder, single episode, unspecified: Secondary | ICD-10-CM | POA: Insufficient documentation

## 2011-04-13 DIAGNOSIS — R0789 Other chest pain: Secondary | ICD-10-CM | POA: Insufficient documentation

## 2011-04-13 DIAGNOSIS — Z8614 Personal history of Methicillin resistant Staphylococcus aureus infection: Secondary | ICD-10-CM | POA: Insufficient documentation

## 2011-04-13 DIAGNOSIS — Z9010 Acquired absence of unspecified breast and nipple: Secondary | ICD-10-CM | POA: Insufficient documentation

## 2011-04-13 DIAGNOSIS — T66XXXA Radiation sickness, unspecified, initial encounter: Secondary | ICD-10-CM | POA: Insufficient documentation

## 2011-04-13 DIAGNOSIS — Z510 Encounter for antineoplastic radiation therapy: Secondary | ICD-10-CM | POA: Insufficient documentation

## 2011-04-13 DIAGNOSIS — F411 Generalized anxiety disorder: Secondary | ICD-10-CM | POA: Insufficient documentation

## 2011-04-13 DIAGNOSIS — Z9221 Personal history of antineoplastic chemotherapy: Secondary | ICD-10-CM | POA: Insufficient documentation

## 2011-04-13 DIAGNOSIS — C50919 Malignant neoplasm of unspecified site of unspecified female breast: Secondary | ICD-10-CM

## 2011-04-13 DIAGNOSIS — L988 Other specified disorders of the skin and subcutaneous tissue: Secondary | ICD-10-CM | POA: Insufficient documentation

## 2011-04-13 NOTE — Progress Notes (Signed)
mrsa of skin left abdomen still performing wet to dry dressing of open wound. Completed chemo on 04/11/2011.

## 2011-04-13 NOTE — Progress Notes (Unsigned)
Menarche at 38 ER PR + G1 P1 H/O possible bipolar disease. No longer taking antipsychotics over 3 years ago. Bilateral mastectomies w/ right axillary ln dissection by Dr.Toth 11/16/2010 h/o MRSA skin lesions

## 2011-04-13 NOTE — Progress Notes (Signed)
Please see the Nurse Progress Note in the MD Initial Consult Encounter for this patient. 

## 2011-04-13 NOTE — Progress Notes (Signed)
CC:   Crystal [name], M.D. Crystal [name]. Crystal [name], M.D. Crystal Cables, MD  DIAGNOSIS:  T1 N1 invasive ductal carcinoma of the right breast.  PREVIOUS INTERVENTIONS: 1. Bilateral mastectomies, 11/16/2010, revealing no malignancy in the     left breast and 1.9 cm high-grade invasive ductal carcinoma in the     right breast with closest margin at 0.6 cm from the deep margin,     2/16 lymph nodes positive for metastatic disease. 2. TCH chemotherapy completed 04/11/2011.  INTERVAL HISTORY:  Crystal [name] reports for followup today.  She has finally finished her chemotherapy.  This was complicated with some wound infections.  She has been on doxycycline and is still packing an abdominal wound.  She has an appointment in January to follow up with Dr. Jamse Mead office regarding that.  Overall, she feels like she is glad to be done with chemotherapy and her Herceptin is scheduled to begin again in January.  She is starting to contemplate what it means to come to the end of treatment and is interested in signing up for our survivorship groups.  PHYSICAL EXAMINATION:  General:  She is a pleasant female in no distress sitting comfortably on the exam room table.  Vital Signs:  Blood pressure 122/84, temperature 97.7, pulse 84, weight 137 pounds.  She is status post bilateral mastectomies.  Her scars are well healed.  She has no palpable axillary disease on the right.  IMPRESSION:  T1 N1 invasive ductal carcinoma of the right breast status post right mastectomy and axillary lymph node dissection, and left prophylactic mastectomy.  RECOMMENDATIONS:  Crystal [name] and I discussed the role of radiation in the treatment of her malignancy.  We discussed 30-35 treatments as an outpatient.  We discussed the process of simulation and placement of tattoos.  We discussed possible side effects of treatment including but not limited to fatigue and skin redness.  She would like to delay the start of her treatment  until after the holidays which I think is reasonable.  I will plan on simulating her on January 2nd.  She has been given this treatment appointment.  She has signed informed consent and agreed to proceed forward.    ______________________________ [name], M.D. SW/MEDQ  D:  04/14/2011  T:  04/13/2011  Job:  [id number]

## 2011-04-18 ENCOUNTER — Other Ambulatory Visit: Payer: Self-pay | Admitting: Lab

## 2011-04-18 ENCOUNTER — Ambulatory Visit: Payer: Self-pay | Admitting: Oncology

## 2011-04-21 ENCOUNTER — Telehealth: Payer: Self-pay | Admitting: *Deleted

## 2011-04-21 ENCOUNTER — Other Ambulatory Visit: Payer: Self-pay | Admitting: Physician Assistant

## 2011-04-21 DIAGNOSIS — C50919 Malignant neoplasm of unspecified site of unspecified female breast: Secondary | ICD-10-CM

## 2011-04-21 NOTE — Telephone Encounter (Signed)
Pt call with concerns about radiation and scheduling appt with either Dr. [name] or Zollie Scale.  Pt relate she isn't sure if she wants to proceed with radiation.  She related that she would like to cancel [facility] but that she would like to keep her appt with Dr. Michell Heinrich to discuss in further detail the risks and benefits of radiation with her husband present.  She is also interested in doing homeopathic treatments.  She related that she missed her appt with Dr. [name] on 04/18/11 d/t her husband was not able to come with her.  Informed pt we will r/s appt with Dr. [name] or Zollie Scale to discuss Tamoxifen and schedule next herceptin treatment. Pt relate she is feeling "anxious with the new treatments that I'm  getting ready to start.  Informed pt we will have her meet with both physicians prior to her starting any new treatments.  Encourage pt to call with needs or concerns.  Contact information given.

## 2011-04-22 ENCOUNTER — Telehealth: Payer: Self-pay | Admitting: *Deleted

## 2011-04-22 NOTE — Telephone Encounter (Signed)
gave patient appointment for 03-2013printed out calendar and gave to the patient 

## 2011-04-27 ENCOUNTER — Telehealth: Payer: Self-pay | Admitting: *Deleted

## 2011-04-27 NOTE — Telephone Encounter (Signed)
patient confirmed over the phone the date and time on 05-05-2011 starting at 10:45am

## 2011-05-04 ENCOUNTER — Ambulatory Visit
Admission: RE | Admit: 2011-05-04 | Discharge: 2011-05-04 | Disposition: A | Discharge status: Home / Self Care | Payer: Self-pay | Source: Ambulatory Visit | Attending: Radiation Oncology | Admitting: Radiation Oncology

## 2011-05-04 ENCOUNTER — Encounter: Payer: Self-pay | Admitting: Radiation Oncology

## 2011-05-04 ENCOUNTER — Encounter (INDEPENDENT_AMBULATORY_CARE_PROVIDER_SITE_OTHER): Payer: Self-pay

## 2011-05-04 DIAGNOSIS — C50919 Malignant neoplasm of unspecified site of unspecified female breast: Secondary | ICD-10-CM

## 2011-05-04 NOTE — Progress Notes (Signed)
FINISHED CHEMO 04/11/11, TAXETERE AND CARBO .  NOTED EDEMA IN RIGHT ARM AND HAND, ? LYMPHADEMA

## 2011-05-04 NOTE — Patient Care Conference (Signed)
Met with patient to discuss RO billing.  Gave patient new epp since previous one expires 05/05/11.

## 2011-05-04 NOTE — Progress Notes (Signed)
Name: Crystal [name]   [id number]  Date:  05/04/2011  DOB: 11/28/1975  Status:outpatient    DIAGNOSIS: Breast cancer.  CONSENT VERIFIED: yes   SET UP: Patient is setup supine   IMMOBILIZATION:  The following immobilization was used:Custom Moldable Pillow, breast board.   NARRATIVE:The patient was brought to the CT Simulation planning suite.  Identity was confirmed.  All relevant records and images related to the planned course of therapy were reviewed.  Then, the patient was positioned in a stable reproducible clinical set-up for radiation therapy.  Wires were placed on her chest wall to delineate the clinical extent of breast tissue. A wire was placed on the scar as well as bbs on her drain sites.  CT images were obtained.  Skin markings were placed.  The CT images were loaded into the planning software where the target and avoidance structures were contoured.  The radiation prescription was entered and confirmed. The patient was discharged in stable condition and tolerated simulation well.    TREATMENT PLANNING NOTE:  Treatment planning then occurred. I have requested : MLC's, isodose plan, basic dose calculation

## 2011-05-04 NOTE — Progress Notes (Signed)
Please see the Nurse Progress Note in the MD Initial Consult Encounter for this patient. 

## 2011-05-05 ENCOUNTER — Other Ambulatory Visit (HOSPITAL_BASED_OUTPATIENT_CLINIC_OR_DEPARTMENT_OTHER): Payer: Self-pay | Admitting: Lab

## 2011-05-05 ENCOUNTER — Encounter: Payer: Self-pay | Admitting: Physician Assistant

## 2011-05-05 ENCOUNTER — Encounter: Payer: Self-pay | Admitting: Specialist

## 2011-05-05 ENCOUNTER — Ambulatory Visit (HOSPITAL_BASED_OUTPATIENT_CLINIC_OR_DEPARTMENT_OTHER): Payer: Self-pay | Admitting: Physician Assistant

## 2011-05-05 VITALS — BP 114/70 | HR 91 | Temp 98.4°F | Ht 64.0 in | Wt 136.0 lb

## 2011-05-05 DIAGNOSIS — Z8614 Personal history of Methicillin resistant Staphylococcus aureus infection: Secondary | ICD-10-CM

## 2011-05-05 DIAGNOSIS — C50919 Malignant neoplasm of unspecified site of unspecified female breast: Secondary | ICD-10-CM

## 2011-05-05 DIAGNOSIS — L989 Disorder of the skin and subcutaneous tissue, unspecified: Secondary | ICD-10-CM

## 2011-05-05 DIAGNOSIS — Z170 Estrogen receptor positive status [ER+]: Secondary | ICD-10-CM

## 2011-05-05 LAB — CBC WITH DIFFERENTIAL/PLATELET
BASO%: 0.3 % (ref 0.0–2.0)
Basophils Absolute: 0 10*3/uL (ref 0.0–0.1)
EOS%: 0.4 % (ref 0.0–7.0)
MCH: 35.8 pg — ABNORMAL HIGH (ref 25.1–34.0)
MCHC: 35.1 g/dL (ref 31.5–36.0)
MCV: 101.9 fL — ABNORMAL HIGH (ref 79.5–101.0)
MONO%: 8.4 % (ref 0.0–14.0)
RBC: 3.18 10*6/uL — ABNORMAL LOW (ref 3.70–5.45)
RDW: 16.1 % — ABNORMAL HIGH (ref 11.2–14.5)

## 2011-05-05 LAB — COMPREHENSIVE METABOLIC PANEL
Alkaline Phosphatase: 56 U/L (ref 39–117)
Glucose, Bld: 83 mg/dL (ref 70–99)
Sodium: 140 mEq/L (ref 135–145)
Total Bilirubin: 0.3 mg/dL (ref 0.3–1.2)
Total Protein: 5.9 g/dL — ABNORMAL LOW (ref 6.0–8.3)

## 2011-05-05 LAB — CANCER ANTIGEN 27.29: CA 27.29: 22 U/mL (ref 0–39)

## 2011-05-05 MED ORDER — DOXYCYCLINE HYCLATE 100 MG PO CPEP: 100.0000 mg | ORAL_CAPSULE | Freq: Every day | ORAL | Status: DC

## 2011-05-05 NOTE — Progress Notes (Signed)
CC:   Lowella Dell, M.D. Ollen Gross. Vernell Morgans, M.D. Pearline Cables, MD  DIAGNOSIS:  Right breast cancer.  HISTORY OF PRESENT ILLNESS:  I saw Crystal [name] today for followup.  I had last seen her on January 12th for followup after chemotherapy.  We discussed the role of adjuvant radiation in the management of her disease.  She called our breast cancer navigator and was worried about radiation, and asked for a followup visit and to cancel her simulation. I left her simulation appointment intact and asked our navigator to schedule her with Plastic Surgery, and scheduled her for a followup appointment with her and her husband today.  I met with her and her husband today.  She and her husband have been investigating holistic treatment such as intravenous "H2O2."  They have also been investigating alternative medications, and had questions about celebrities such as Nance Pear, who do not advocate for standard treatment, as well as meditation as her husband had read a story about a monk who was able to visualize men chipping away at his lung tumor and thereby cured himself of lung cancer.  She states she understands radiation but just was questioning whether it was the right treatment decision for her.  She has also noticed the development of some right arm swelling.  She did not attend our after breast cancer class, although she states she did get information regarding that.  She would like to be referred to physical therapy if possible.  PHYSICAL EXAMINATION:  She is a pleasant female, who is alert and oriented x3.  Weight 138 pounds.  Height 5 feet 4 inches.  Blood pressure 106/65, pulse 95, temperature 98.5.  She does have arm swelling from the elbow down, which is notable as compared to her left arm.  IMPRESSION:  T1 N1 right breast cancer status post bilateral mastectomies and adjuvant chemotherapy.  RECOMMENDATIONS:  I talked to Sotiria and her husband for 30 minutes today.   Greater than 50% of this time was spent in counseling and coordination of care.  We discussed again the role of adjuvant radiation and the survival benefit seen in randomized trials.  We discussed the reviews in the literature in women with 1 to 3 lymph nodes, and that the benefit in terms of survival and local control was likely less than her case.  We discussed that her young age was not the median age in any of the postmastectomy trials, and that she had longer to live to experience local recurrence.  She and her husband were also concerned about secondary malignancies and we talked about angiosarcoma and lung cancers, and the risk factors for those, including lymphedema and smoking.  We discussed the side effects of treatment including but not limited to, skin redness, skin pain, and fatigue.  We discussed the possibility of lung and rib damage and our steps, including CT simulation and the use of high-energy photons rather than cobalt to decrease these possible side effects.  We also discussed the limited reconstruction options available to women who have had chest wall radiation.  We discussed use of TRAM flap or latissimus flap rather than implants.  We discussed, of course, she could have any type of reconstruction on the left side, but would have to have autologous tissue reconstruction on the right.  I have made a referral to physical therapy today.  After a discussion with her husband, she agreed to CT simulation and to proceed on with treatment.  I have also spoken  to another patient who is familiar with holistic treatment approaches, and Lashell has given her permission for the patient to contact her.  I also think she would be a good candidate for our Finding a New Normal class and she is interested in enrolling in that.    ______________________________ [name], M.D. SW/MEDQ  D:  05/05/2011  T:  05/05/2011  Job:  [id number]

## 2011-05-05 NOTE — Progress Notes (Signed)
Met with pt while she was here for appt.  Pt expressed interest in participating in the next [facility] session, beginning on Feb 4.  She described experiencing post-tx issues very common in pts who have completed tx.  She will begin radiation this month. She will let me know the end of January re her energy level and if she wants to be in the Feb [facility] or wait until the spring session.  I also told pt about counseling services that are available.  TMP

## 2011-05-05 NOTE — Progress Notes (Signed)
Hematology and Oncology Follow Up Visit  Crystal THIBEAUX [id number] 28-Sep-1975 36 y.o. 05/05/2011    HPI: The patient is a 36 year old [place] woman who noted a mass in her right breast and brought it to her primary care physician's attention June of 2012. She was set up for mammography at [facility], where an area of calcifications highly suspicious for carcinoma was biopsied. This was in the upper outer quadrant and it measured 1.7 cm mammographically and 1.8 cm by ultrasonography. The pathology report ([id number]) showed a high-grade invasive ductal carcinoma estrogen receptor positive at 64% progesterone receptor positive at 18%, and HER-2 amplified with a ratio by CISH of 5.24. MIB-1-1 was 65%. A second mass biopsied at the same time was also triple positive.    Given her multiple masses in the right breast, mastectomy was recommended. The patient actually opted for bilateral mastectomies and this was performed together with a right axillary lymph node dissection under Dr. Felicity Pellegrini on 11/16/2010. The final pathology [id number]) showed, on the left, no evidence of cancer. On the right the patient had a high-grade invasive ductal carcinoma measuring 1.9 cm, with negative margins, grade 3, involving 2 of 16 lymph nodes (stage IIB).  Harlo was treated in the adjuvant setting, with 6 doses of docetaxel/carboplatin/trastuzumab, completed December 2012. The plan is to proceed to radiation therapy, continue trastuzumab for total of one year, and also began on antiestrogen therapy once radiation has been completed.   Interim History:   Crystal [name] returns today for followup and assessment. She completed her adjuvant chemotherapy on December 10, and is glad to have that behind her. She will be continuing on trastuzumab every 3 weeks, beginning next week, January 7. She has met with Dr. Michell Heinrich on 2 occasions, and although she was very hesitant first, she is now ready to proceed with radiation therapy. She  scheduled for simulation in the next week or so.  [name] has been quite anxious, and over half of our 45 minute appointment today was spent with counseling and coordination of care.  Physically, [name] continues to be quite tired. She's having some lymphedema in the right upper extremity and is scheduled to meet with the therapist at the lymphedema clinic next week. She's had no additional peripheral swelling.  She has a new skin lesion in the left occipital region, appearing like a "pimple", but only mildly tender. She's had no additional skin lesions, and the skin lesions previously diagnosed as MRSA have resolved. She completed 2 courses of doxycycline. She has had no fevers, chills, or night sweats. She denies any additional skin changes.  [name] has had no persistent signs of peripheral neuropathy. She's had no excessive tearing. No mouth ulcers or oral sensitivity. No nausea, and no recent change in bowel habits.  A detailed review of systems is otherwise noncontributory as noted below.  Review of Systems: Constitutional:  Fatigue, no weight loss, fever, night sweats Eyes: negative GMW:NUUVO congestion Cardiovascular: no chest pain or dyspnea on exertion Respiratory: no cough, shortness of breath, or wheezing Neurological: negative Dermatological: red lesion on scalp Gastrointestinal: no abdominal pain, change in bowel habits, or black or bloody stools Genito-Urinary: no dysuria, trouble voiding, or hematuria Hematological and Lymphatic: negative Breast: negative Musculoskeletal: positive for - muscle pain and swelling in arm - left Remaining ROS negative.  PAST MEDICAL HISTORY:   1. History of seizures.  The patient has been thoroughly evaluated both here by Dr. [name] and at [facility] for this.  Among many studies,  she had an MRI of the brain on August 28th.  This showed an incidental solitary small lesion in the left frontoparietal white matter.  It was not felt to be  suggestive of multiple sclerosis.  A noncontrasted CT in August 2010 was negative.  The patient has been off all seizure medications now for 2 months.  There has been no evidence of seizure recurrence. 2. Complex psychology history (please refer to Dr. [name] note in the E-chart from August 29, 2009) with evidence of anxiety disorder, depression and psychosis.  The patient is currently on no medications for this and denies any of these symptoms at present. 3. History of migraines. 4. Remote history of multidrug abuse. 5. Status post wisdom teeth extraction.  FAMILY HISTORY:  The patient's mother is living, in her mid 57s, with no history of breast or ovarian cancer.  The patient does not know her biological father.  The patient has an older brother with bipolar disease, and according to the patient, borderline schizophrenia.  GYNECOLOGIC HISTORY:  Menarche at age 89.  She is G1, P1 with first pregnancy to term at age 30.  The patient had significant pelvic pain recently and was evaluated with transvaginal and pelvic ultrasound on July 17th by Dr. Natale Milch.  This showed normal uterine myometrium and endometrium with bilateral functional ovarian cysts.  There were no worrisome features.  The patient had a Pap smear which was unremarkable on July 26th.  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  Her husband of 4 years, [name],  works as a Administrator.  In addition to them at home is the patient's son, [name], 65 years old attends [employer] in the tenth grade.  [name], according to the patient, has significant nausea problems as indeed the patient did previously.  The patient attends a local [address].  [name] is pastor.   Medications:   I have reviewed the patient's current medications.  Current Outpatient Prescriptions  Medication Sig Dispense Refill  . ALPRAZolam (XANAX) 0.5 MG tablet Take 0.5 mg by mouth 3  (three) times daily as needed. anxiety      . dexamethasone (DECADRON) 4 MG tablet Take 0.5 tablets (2 mg total) by mouth once.  1 tablet  0  . doxycycline (DORYX) 100 MG DR capsule Take 1 capsule (100 mg total) by mouth daily. Second round started on 04/11/11 for mrsa of skin (abdomen)  10 capsule  0  . famotidine (PEPCID) 20 MG tablet Take 20 mg by mouth 2 (two) times daily.       Marland Kitchen lidocaine-prilocaine (EMLA) cream Apply topically as needed. Apply to port as needed      . polyethylene glycol (MIRALAX / GLYCOLAX) packet Take 17 g by mouth daily. constipation       . promethazine (PHENERGAN) 25 MG tablet Take 25 mg by mouth every 6 (six) hours as needed. nausea      . tobramycin-dexamethasone (TOBRADEX) ophthalmic ointment Place 1 application into both eyes 2 (two) times daily. Pt uses after chemo for blurred vision       . traMADol (ULTRAM) 50 MG tablet Take 50 mg by mouth every 6 (six) hours as needed. pain        Allergies:  Allergies  Allergen Reactions  . Phenytoin Nausea And Vomiting  . ZOX:WRUEAVWUJWJ+XBJYNWGNF+AOZHYQMVHQ Acid+Aspartame Other (See Comments)    faint  . Thorazine (Chlorpromazine Hcl) Hives  .  Augmentin Other (See Comments)    Syncope  . Ciprofloxacin Nausea And Vomiting  . Vancomycin Rash    Possible rash reported by MD    Physical Exam: Filed Vitals:   05/05/11 1128  BP: 114/70  Pulse: 91  Temp: 98.4 F (36.9 C)   HEENT:  Sclerae anicteric, conjunctivae pink.  Oropharynx clear.  No mucositis or candidiasis.   Nodes:  No cervical, supraclavicular, or axillary lymphadenopathy palpated.  Breast Exam:  Status post bilateral mastectomies, well-healed incisions. No suspicious nodularity or skin changes.  Lungs:  Clear to auscultation bilaterally.  No crackles, rhonchi, or wheezes.   Heart:  Regular rate and rhythm.   Abdomen:  Soft, nontender.  Positive bowel sounds.  No organomegaly or masses palpated.   Musculoskeletal:  No focal spinal tenderness to  palpation.  Extremities:  Lymphedema in the right upper extremity. No additional peripheral swelling. No peripheral cyanosis.   Skin:  Small erythematous, pustular appearing lesion in the left occipital region, measuring less than 0.5 cm in diameter. No active drainage. Skin is otherwise benign. Neuro:  Nonfocal.   Lab Results: Lab Results  Component Value Date   WBC 6.5 05/05/2011   HGB 11.4* 05/05/2011   HCT 32.4* 05/05/2011   MCV 101.9* 05/05/2011   PLT 224 05/05/2011   NEUTROABS 4.3 05/05/2011     Chemistry      Component Value Date/Time   NA 140 05/05/2011 1048   K 3.6 05/05/2011 1048   CL 105 05/05/2011 1048   CO2 25 05/05/2011 1048   BUN 16 05/05/2011 1048   CREATININE 0.89 05/05/2011 1048      Component Value Date/Time   CALCIUM 9.4 05/05/2011 1048   ALKPHOS 56 05/05/2011 1048   AST 21 05/05/2011 1048   ALT 20 05/05/2011 1048   BILITOT 0.3 05/05/2011 1048        Radiological Studies:  No results found.   Assessment:  36 year old [place] woman, known to be BRCA1 and 2 negative,  (1) status post bilateral mastectomies May of 2012 for a right-sided T1c N1 (stage II) grade 3 invasive ductal carcinoma,estrogen and progesterone receptor positive, HER-2 amplified, with an MIB-1-1 of 85%.  (2) status post carboplatin/docetaxol/trastuzumab x6, completed mid December 2012  (3) continuing on trastuzumab every 3 weeks . Will be receiving radiation therapy, after which she'll be down anti-estrogen therapy (4) History of MRSA skin infection    Plan:  Holland appears to be recovering well from her chemotherapy. She'll return next week on January 7 to start back on her q. 3 week trastuzumab. As noted above, she will also be beginning radiation therapy within the next week or 2.  Makenzie will receive trastuzumab again on January 28, and return for followup with Dr. [name] with her trastuzumab dose on February 18. Prior to that appointment, she will need a repeat echocardiogram, and I have requested  a followup appointment with Dr. Gala Romney as well.  Finally, I am going to continue another brief course of doxycycline, 100 mg daily for 10 days, to clear up the lesion on her scalp. I have asked Jalysa to contact us if she develops any additional lesions, or if this lesion does not resolve.  This plan was reviewed with the patient, who voices understanding and agreement.  She knows to call with any changes or problems.    Tatym Schermer, PA-C 05/05/2011

## 2011-05-06 ENCOUNTER — Telehealth: Payer: Self-pay

## 2011-05-06 ENCOUNTER — Other Ambulatory Visit: Payer: Self-pay

## 2011-05-06 DIAGNOSIS — C50919 Malignant neoplasm of unspecified site of unspecified female breast: Secondary | ICD-10-CM

## 2011-05-06 MED ORDER — CEPHALEXIN 500 MG PO CAPS: 500.0000 mg | ORAL_CAPSULE | Freq: Two times a day (BID) | ORAL | Status: AC

## 2011-05-06 NOTE — Telephone Encounter (Signed)
Received call from [facility] pharmacy stating that long acting doxycyline 150 mg is too expensive for pt, and the cheaper, 100 mg tab is on back-order, and not available at several local pharmacies.  Informed Jim, pharmacist, that 100 mg was ordered by PA, not 150 mg.  Pharmacist wants to know would MD like to prescribe something else for pt.  Per Dr. [name], pt to take keflex 500 mg 1 po bid x 5 days.  This prescription called in.

## 2011-05-09 ENCOUNTER — Telehealth: Payer: Self-pay | Admitting: Oncology

## 2011-05-09 ENCOUNTER — Ambulatory Visit (HOSPITAL_BASED_OUTPATIENT_CLINIC_OR_DEPARTMENT_OTHER): Payer: Self-pay

## 2011-05-09 ENCOUNTER — Other Ambulatory Visit: Payer: Self-pay | Admitting: Certified Registered Nurse Anesthetist

## 2011-05-09 ENCOUNTER — Other Ambulatory Visit (HOSPITAL_BASED_OUTPATIENT_CLINIC_OR_DEPARTMENT_OTHER): Payer: Self-pay | Admitting: Lab

## 2011-05-09 ENCOUNTER — Other Ambulatory Visit: Payer: Self-pay | Admitting: Oncology

## 2011-05-09 VITALS — BP 110/74 | HR 108 | Temp 97.9°F

## 2011-05-09 DIAGNOSIS — C773 Secondary and unspecified malignant neoplasm of axilla and upper limb lymph nodes: Secondary | ICD-10-CM

## 2011-05-09 DIAGNOSIS — Z5112 Encounter for antineoplastic immunotherapy: Secondary | ICD-10-CM

## 2011-05-09 DIAGNOSIS — C50919 Malignant neoplasm of unspecified site of unspecified female breast: Secondary | ICD-10-CM

## 2011-05-09 DIAGNOSIS — C50419 Malignant neoplasm of upper-outer quadrant of unspecified female breast: Secondary | ICD-10-CM

## 2011-05-09 LAB — CBC WITH DIFFERENTIAL/PLATELET
BASO%: 0.1 % (ref 0.0–2.0)
Basophils Absolute: 0 10*3/uL (ref 0.0–0.1)
EOS%: 0.2 % (ref 0.0–7.0)
HGB: 9.9 g/dL — ABNORMAL LOW (ref 11.6–15.9)
MCH: 34.4 pg — ABNORMAL HIGH (ref 25.1–34.0)
MCHC: 33.7 g/dL (ref 31.5–36.0)
MCV: 102.1 fL — ABNORMAL HIGH (ref 79.5–101.0)
MONO%: 8 % (ref 0.0–14.0)
RBC: 2.88 10*6/uL — ABNORMAL LOW (ref 3.70–5.45)
RDW: 15.9 % — ABNORMAL HIGH (ref 11.2–14.5)

## 2011-05-09 MED ORDER — SODIUM CHLORIDE 0.9 % IV SOLN
Freq: Once | INTRAVENOUS | Status: AC
  Administered 2011-05-09: 11:00:00 via INTRAVENOUS

## 2011-05-09 MED ORDER — SODIUM CHLORIDE 0.9 % IJ SOLN
10.0000 mL | INTRAMUSCULAR | Status: DC | PRN
  Filled 2011-05-09: qty 10

## 2011-05-09 MED ORDER — ACETAMINOPHEN 325 MG PO TABS
650.0000 mg | ORAL_TABLET | Freq: Once | ORAL | Status: AC
  Administered 2011-05-09: 650 mg via ORAL

## 2011-05-09 MED ORDER — DIPHENHYDRAMINE HCL 25 MG PO CAPS
25.0000 mg | ORAL_CAPSULE | Freq: Once | ORAL | Status: AC
  Administered 2011-05-09: 25 mg via ORAL

## 2011-05-09 MED ORDER — HEPARIN SOD (PORK) LOCK FLUSH 100 UNIT/ML IV SOLN
500.0000 [IU] | Freq: Once | INTRAVENOUS | Status: DC | PRN
  Filled 2011-05-09: qty 5

## 2011-05-09 MED ORDER — TRASTUZUMAB CHEMO INJECTION 440 MG
6.0000 mg/kg | Freq: Once | INTRAVENOUS | Status: AC
  Administered 2011-05-09: 378 mg via INTRAVENOUS
  Filled 2011-05-09: qty 18

## 2011-05-09 NOTE — Patient Instructions (Signed)
Patient aware of next appointment; discharged home and reports no pain.

## 2011-05-09 NOTE — Telephone Encounter (Signed)
pt ccame by and p/u scheduled for jan-march2013

## 2011-05-11 ENCOUNTER — Ambulatory Visit
Admission: RE | Admit: 2011-05-11 | Discharge: 2011-05-11 | Disposition: A | Discharge status: Home / Self Care | Payer: Self-pay | Source: Ambulatory Visit | Attending: Radiation Oncology | Admitting: Radiation Oncology

## 2011-05-11 ENCOUNTER — Encounter: Payer: Self-pay | Admitting: *Deleted

## 2011-05-11 ENCOUNTER — Encounter: Payer: Self-pay | Admitting: Radiation Oncology

## 2011-05-11 HISTORY — DX: Personal history of Methicillin resistant Staphylococcus aureus infection: Z86.14

## 2011-05-11 NOTE — Progress Notes (Signed)
[facility] Distress Screening  Patient scored a 5 on the Psychosocial Distress Thermometer which indicates moderate distress.  Pt was seen by Lenell Antu, [facility] on 05/05/11 while she was here for an appointment.  Terry processed pt's needs and informed her of support services available at [facility].  No sever distress was identified, but CSW is available if needs arise.   Tamala Julian, LCSW

## 2011-05-12 ENCOUNTER — Other Ambulatory Visit: Payer: Self-pay | Admitting: *Deleted

## 2011-05-12 ENCOUNTER — Other Ambulatory Visit: Payer: Self-pay | Admitting: Lab

## 2011-05-12 ENCOUNTER — Ambulatory Visit: Payer: Self-pay

## 2011-05-12 ENCOUNTER — Ambulatory Visit
Admission: RE | Admit: 2011-05-12 | Discharge: 2011-05-12 | Disposition: A | Discharge status: Home / Self Care | Payer: Self-pay | Source: Ambulatory Visit | Attending: Radiation Oncology | Admitting: Radiation Oncology

## 2011-05-12 ENCOUNTER — Encounter: Payer: Self-pay | Admitting: *Deleted

## 2011-05-12 ENCOUNTER — Ambulatory Visit: Payer: Self-pay | Attending: Radiation Oncology | Admitting: Physical Therapy

## 2011-05-12 DIAGNOSIS — IMO0001 Reserved for inherently not codable concepts without codable children: Secondary | ICD-10-CM | POA: Insufficient documentation

## 2011-05-12 DIAGNOSIS — C50919 Malignant neoplasm of unspecified site of unspecified female breast: Secondary | ICD-10-CM

## 2011-05-12 DIAGNOSIS — M24519 Contracture, unspecified shoulder: Secondary | ICD-10-CM | POA: Insufficient documentation

## 2011-05-12 DIAGNOSIS — D649 Anemia, unspecified: Secondary | ICD-10-CM

## 2011-05-12 DIAGNOSIS — I890 Lymphedema, not elsewhere classified: Secondary | ICD-10-CM | POA: Insufficient documentation

## 2011-05-12 MED ORDER — ALRA NON-METALLIC DEODORANT (RAD-ONC)
1.0000 "application " | Freq: Once | TOPICAL | Status: AC
  Administered 2011-05-12: 1 via TOPICAL

## 2011-05-12 MED ORDER — RADIAPLEXRX EX GEL
Freq: Once | CUTANEOUS | Status: AC
  Administered 2011-05-12: 1 via TOPICAL

## 2011-05-12 NOTE — Progress Notes (Unsigned)
This RN received call from [place] in Rad[facility]c per

## 2011-05-12 NOTE — Progress Notes (Signed)
This RN was contacted by Corrie Dandy in RadOnc with pt's concerns due to noted lower heme level and severe fatique occuring.  Corrie Dandy states pt and husband are very concerned about above and would like to have further work up.  This RN called pt and spoke with her per symptoms and obtaining additional labs for review. Plan at present is for pt have labs on Monday 1/14 post radiation and then speak with this RN.

## 2011-05-12 NOTE — Progress Notes (Signed)
Post sim done w/pt and spouse. Received "Radiation and You" booklet, Radiaplex, Alra deodorant. All questions answered.  Pt and spouse very concerned about pt's Hgb of 9.9 on 05/09/11. Spouse states he "has noticed a big change in pt past week. She is forgetful, sleeps 13 hrs, doesn't want to get out of bed. A week ago pt was shopping, performing normal activities." They are requesting pt's labs be drawn before 05/30/11.  Spoke w/Dr Michell Heinrich; per dr, called V Dodd,RN for Dr [name]. Notified her of pt's concerns. She will call Dr [name] and call pt w/information. Pt verbalized understanding.  Discussed dietary changes to help add protein to pt's diet. Pt has seen dietician; reminded her she can see dietician again.

## 2011-05-13 ENCOUNTER — Ambulatory Visit
Admission: RE | Admit: 2011-05-13 | Discharge: 2011-05-13 | Disposition: A | Discharge status: Home / Self Care | Payer: Self-pay | Source: Ambulatory Visit | Attending: Radiation Oncology | Admitting: Radiation Oncology

## 2011-05-16 ENCOUNTER — Ambulatory Visit: Admission: RE | Admit: 2011-05-16 | Payer: Self-pay | Source: Ambulatory Visit

## 2011-05-16 ENCOUNTER — Other Ambulatory Visit: Payer: Self-pay | Admitting: Lab

## 2011-05-16 ENCOUNTER — Other Ambulatory Visit (HOSPITAL_BASED_OUTPATIENT_CLINIC_OR_DEPARTMENT_OTHER): Payer: Self-pay | Admitting: Lab

## 2011-05-16 ENCOUNTER — Ambulatory Visit: Payer: Self-pay | Admitting: Physical Therapy

## 2011-05-16 ENCOUNTER — Encounter: Payer: Self-pay | Admitting: *Deleted

## 2011-05-16 ENCOUNTER — Ambulatory Visit
Admission: RE | Admit: 2011-05-16 | Discharge: 2011-05-16 | Disposition: A | Discharge status: Home / Self Care | Payer: Self-pay | Source: Ambulatory Visit | Attending: Radiation Oncology | Admitting: Radiation Oncology

## 2011-05-16 DIAGNOSIS — C50919 Malignant neoplasm of unspecified site of unspecified female breast: Secondary | ICD-10-CM

## 2011-05-16 DIAGNOSIS — D649 Anemia, unspecified: Secondary | ICD-10-CM

## 2011-05-16 LAB — FOLATE: Folate: >20 ng/mL

## 2011-05-16 LAB — CBC WITH DIFFERENTIAL/PLATELET
Basophils Absolute: 0 10*3/uL (ref 0.0–0.1)
EOS%: 0.4 % (ref 0.0–7.0)
MCH: 33.6 pg (ref 25.1–34.0)
MCV: 97.8 fL (ref 79.5–101.0)
MONO%: 5.8 % (ref 0.0–14.0)
RBC: 3.57 10*6/uL — ABNORMAL LOW (ref 3.70–5.45)
RDW: 14.3 % (ref 11.2–14.5)
nRBC: 0 % (ref 0–0)

## 2011-05-16 LAB — FERRITIN: Ferritin: 170 ng/mL (ref 10–291)

## 2011-05-16 NOTE — Progress Notes (Unsigned)
This RN spoke with pt today per cbc results and concerns with fatigue, SOB and intermittant mid chest discomfort.  Noted prior anemia resolved per lab results today.  This RN discussed with pt post chemo/surgery and radiation fatigue in a woman of her age .  SOB occurs with minimial activity but resolves with rest.  intermittant chest pain in very sporadic and not associated with activity. Per inquiry pt denies left arm numbness, nausea or cold sweat with episodes.  Pt is scheduled to see Dr Milas Kocher on 1/24 for cardiac evaluation with herceptin regiman.  No further needs at end of conversation - this RN did inform Crystal [name] if her husband has further concerns he may call me.  Copy of labs given to pt.  All the above reviewed with MD.

## 2011-05-17 ENCOUNTER — Ambulatory Visit
Admission: RE | Admit: 2011-05-17 | Discharge: 2011-05-17 | Disposition: A | Discharge status: Home / Self Care | Payer: Self-pay | Source: Ambulatory Visit | Attending: Radiation Oncology | Admitting: Radiation Oncology

## 2011-05-17 DIAGNOSIS — C50919 Malignant neoplasm of unspecified site of unspecified female breast: Secondary | ICD-10-CM

## 2011-05-17 NOTE — Progress Notes (Signed)
SAYS HER ENERGY IS BETTER TODAY DUE TO HER ANEMIA.  C/O TIGHTNESS  IN CENTER OF CHEST AND FEELS LIKE SHE CAN'T GET ENOUGH AIR IN HER LUNGS.  SKIN LOOKS GOOD.  H/H LOOK GOOD, SEE LABS.

## 2011-05-17 NOTE — Progress Notes (Signed)
Weekly Management Note Current Dose: 7.2  Gy  Projected Dose: 60.4  Gy   Narrative:  The patient presents for routine under treatment assessment.  CBCT/MVCT images/Port film x-rays were reviewed.  The chart was checked. Describes feeling of chest tightness not in morning or with exertion but on her way to and from treatments.  Has been going on x 1 week since starting RT.  Is trying to wean off xanax to get off some of the pills she is taking. Danced this morning and became short of breath but was happy because she was moving.  Hgb normal yesterday.   Physical Findings: Weight: 134 lb 9.6 oz (61.054 kg). Unchanged. Pulse ox 100% on room air. Pulse 137.  Appears slightly agitated.   CBC    Component Value Date/Time   WBC 7.6 05/16/2011 1246   WBC 6.3 03/30/2011 0532   RBC 3.57* 05/16/2011 1246   RBC 2.15* 03/30/2011 0532   HGB 12.0 05/16/2011 1246   HGB 7.6* 03/30/2011 0532   HCT 34.9 05/16/2011 1246   HCT 22.1* 03/30/2011 0532   PLT 243 05/16/2011 1246   PLT 110* 03/30/2011 0532   MCV 97.8 05/16/2011 1246   MCV 102.8* 03/30/2011 0532   MCH 33.6 05/16/2011 1246   MCH 35.3* 03/30/2011 0532   MCHC 34.4 05/16/2011 1246   MCHC 34.4 03/30/2011 0532   RDW 14.3 05/16/2011 1246   RDW 15.4 03/30/2011 0532   LYMPHSABS 1.7 05/16/2011 1246   LYMPHSABS 1.2 03/27/2011 1825   MONOABS 0.4 05/16/2011 1246   MONOABS 0.5 03/27/2011 1825   EOSABS 0.0 05/16/2011 1246   EOSABS 0.0 03/27/2011 1825   BASOSABS 0.0 05/16/2011 1246   BASOSABS 0.0 03/27/2011 1825      Impression:  The patient is tolerating radiation.  Plan:  Continue treatment as planned. Likely chest tightness due to anxiety as it is occuring on the way to treatment. Encouraged her to take 2 xanax prior to treatment to help and worry about coming off pills after her treatment was over. Appt with cards next week. Continue radiaplex.

## 2011-05-18 ENCOUNTER — Ambulatory Visit
Admission: RE | Admit: 2011-05-18 | Discharge: 2011-05-18 | Disposition: A | Discharge status: Home / Self Care | Payer: Self-pay | Source: Ambulatory Visit | Attending: Radiation Oncology | Admitting: Radiation Oncology

## 2011-05-18 ENCOUNTER — Ambulatory Visit: Payer: Self-pay

## 2011-05-19 ENCOUNTER — Ambulatory Visit: Payer: Self-pay | Admitting: Physical Therapy

## 2011-05-19 ENCOUNTER — Ambulatory Visit
Admission: RE | Admit: 2011-05-19 | Discharge: 2011-05-19 | Disposition: A | Discharge status: Home / Self Care | Payer: Self-pay | Source: Ambulatory Visit | Attending: Radiation Oncology | Admitting: Radiation Oncology

## 2011-05-20 ENCOUNTER — Ambulatory Visit: Payer: Self-pay

## 2011-05-23 ENCOUNTER — Ambulatory Visit
Admission: RE | Admit: 2011-05-23 | Discharge: 2011-05-23 | Disposition: A | Discharge status: Home / Self Care | Payer: Self-pay | Source: Ambulatory Visit | Attending: Radiation Oncology | Admitting: Radiation Oncology

## 2011-05-23 ENCOUNTER — Ambulatory Visit: Payer: Self-pay

## 2011-05-24 ENCOUNTER — Ambulatory Visit
Admission: RE | Admit: 2011-05-24 | Discharge: 2011-05-24 | Disposition: A | Discharge status: Home / Self Care | Payer: Self-pay | Source: Ambulatory Visit | Attending: Radiation Oncology | Admitting: Radiation Oncology

## 2011-05-24 DIAGNOSIS — C50919 Malignant neoplasm of unspecified site of unspecified female breast: Secondary | ICD-10-CM

## 2011-05-24 NOTE — Progress Notes (Signed)
Weekly Management Note Current Dose: 14.4  Gy  Projected Dose: 60.4 Gy   Narrative:  The patient presents for routine under treatment assessment.  CBCT/MVCT images/Port film x-rays were reviewed.  The chart was checked. Doing well. Energy levles better and feels she is dealing with stress in a more healthy way. Worked out this weekend. Met with PT and has left arm wrapped. Feels this has helped her swelling.  Physical Findings: Weight: 138 lb 8 oz (62.823 kg). Slightly pink left chest wall.  Impression:  The patient is tolerating radiation.  Plan:  Continue treatment as planned. Continue radiaplex.

## 2011-05-24 NOTE — Progress Notes (Signed)
HAS A LITTLE NAUSEA OFF AND ON.  SKIN PINK WITH NO DESQUAMATION.  HAS SLEEVE ON RIGHT ARM FROM [facility]

## 2011-05-25 ENCOUNTER — Ambulatory Visit: Payer: Self-pay

## 2011-05-25 ENCOUNTER — Ambulatory Visit
Admission: RE | Admit: 2011-05-25 | Discharge: 2011-05-25 | Disposition: A | Discharge status: Home / Self Care | Payer: Self-pay | Source: Ambulatory Visit | Attending: Radiation Oncology | Admitting: Radiation Oncology

## 2011-05-26 ENCOUNTER — Ambulatory Visit (HOSPITAL_COMMUNITY)
Admission: RE | Admit: 2011-05-26 | Discharge: 2011-05-26 | Disposition: A | Discharge status: Home / Self Care | Payer: Self-pay | Source: Ambulatory Visit | Attending: Internal Medicine | Admitting: Internal Medicine

## 2011-05-26 ENCOUNTER — Encounter (INDEPENDENT_AMBULATORY_CARE_PROVIDER_SITE_OTHER): Payer: Self-pay | Admitting: General Surgery

## 2011-05-26 ENCOUNTER — Ambulatory Visit: Payer: Self-pay | Admitting: Physical Therapy

## 2011-05-26 ENCOUNTER — Encounter (HOSPITAL_COMMUNITY): Payer: Self-pay

## 2011-05-26 ENCOUNTER — Other Ambulatory Visit: Payer: Self-pay

## 2011-05-26 ENCOUNTER — Ambulatory Visit: Payer: Self-pay

## 2011-05-26 VITALS — BP 116/74 | HR 124 | Wt 137.5 lb

## 2011-05-26 DIAGNOSIS — R569 Unspecified convulsions: Secondary | ICD-10-CM | POA: Insufficient documentation

## 2011-05-26 DIAGNOSIS — C50919 Malignant neoplasm of unspecified site of unspecified female breast: Secondary | ICD-10-CM | POA: Insufficient documentation

## 2011-05-26 DIAGNOSIS — R000 Tachycardia, unspecified: Secondary | ICD-10-CM | POA: Insufficient documentation

## 2011-05-26 NOTE — Assessment & Plan Note (Addendum)
Discussed the role of the Heart Failure Clinic while she is receiving herceptin therapy, all questions were answered.  Reviewed her echo results, at this time her echo looks great and she is to continue with herceptin therapy.  She is due for repeat echo in 1 month.  Will order this and have her follow up in 2 months.    Patient seen and examined with Ulyess Blossom PA-C. We discussed all aspects of the encounter. I agree with the assessment and plan as stated above.  Echo reviewed and looks good. Continue Herceptin.

## 2011-05-26 NOTE — Assessment & Plan Note (Signed)
She is tachycardic today and after further discussion appears that she has noticed this for years.  This may be her baseline.  At this time have discussed pros and cons of starting a beta blocker and she would prefer to hold off.  I am agreeable to this as long as her pulse does not sustain >120s.  She is aware to monitor and call if this becomes the case.  If it does become persistent or symptomatic I would start her on toprol 12.5 mg daily.

## 2011-05-26 NOTE — Progress Notes (Signed)
Referring Physician: Dr. [name] Primary Care: Dr. Warner Mccreedy Primary Cardiologist:  none   HPI: Crystal [name] is a 36 y.o. female with history of seizures (2 episodes with last one ~4 yrs ago) and BRCA1 stage II invasive ductal carcinoma, ER/PR/HER-2 positive.  Status post bilateral mastectomy with right axillary lymph node dissection by Dr. Carolynne Edouard on 11/16/10.  She is status post carboplatin/docetaxol/trastuzumab x6, completed mid December 2012.  She is continuing on trastuzumab every 3 weeks for a total of 1 year.  She has also began radiation therapy and will begin antiestrogen therapy once radiation has been completed.  She has been having lymphedema in the right upper extremity and is being seen in the lymphedema clinic.    Echo 03/03/11: EF 60-65%, lateral S' >16 (top cut off)  She has been referred by Dr. [name] for follow up in the Heart Failure Clinic while on herceptin.  She is doing ok.  Prior to appointments she has a heavy feeling in her chest that improves with deep breaths.  She does not have this pain with ambulation.  She had episodes of dyspnea with ambulation after chemotherapy but this has improved since her last treatment in 12/12.    She denies lower extremity edema, orthopnea or PND.  No syncope.  She has noticed that her pulse rate is elevated all the time.  She has occasional palpitations but they do not bother her or cause dyspnea.  EKG today shows sinus tach a 112.     Review of Systems: [y] = yes, [ ]  = no   General: Weight gain Cove.Etienne ]; Weight loss Cove.Etienne ]; Anorexia [ ] ; Fatigue [ ] ; Fever [ ] ; Chills [ ] ; Weakness [ ]   Cardiac: Chest pain/pressure Cove.Etienne ]; Resting SOB [ ] ; Exertional SOB Cove.Etienne ]; Orthopnea [ ] ; Pedal Edema [ ] ; Palpitations Cove.Etienne ]; Syncope [ ] ; Presyncope [ ] ; Paroxysmal nocturnal dyspnea[ ]   Pulmonary: Cough [ ] ; Wheezing[ ] ; Hemoptysis[ ] ; Sputum [ ] ; Snoring [ ]   GI: Vomiting[ ] ; Dysphagia[ ] ; Melena[ ] ; Hematochezia [ ] ; Heartburn[ ] ; Abdominal pain [ ] ;  Constipation [ ] ; Diarrhea [ ] ; BRBPR [ ]   GU: Hematuria[ ] ; Dysuria [ ] ; Nocturia[ ]   Vascular: Pain in legs with walking [ ] ; Pain in feet with lying flat [ ] ; Non-healing sores [ ] ; Stroke [ ] ; TIA [ ] ; Slurred speech [ ] ;  Neuro: Headaches[ ] ; Vertigo[ ] ; Seizures[y ]; Paresthesias[ ] ;Blurred vision [ ] ; Diplopia [ ] ; Vision changes [ ]   Ortho/Skin: Arthritis [ ] ; Joint pain [ ] ; Muscle pain [ ] ; Joint swelling [ ] ; Back Pain [ ] ; Rash [ ]   Psych: Depression[ ] ; Anxiety[y ]  Heme: Bleeding problems [ ] ; Clotting disorders [ ] ; Anemia [ ]   Endocrine: Diabetes [ ] ; Thyroid dysfunction[ ]    Past Medical History  Diagnosis Date  . Allergy   . Right breast ca dx'd 10/2010  . Breast cancer 12/03/10  . Status post chemotherapy     docetaxel/carboplatin/trastuzumab  . Anxiety   . Seizures     last seizure 2 years ago, diagnosed 3 years ago  . Hx MRSA infection 03/28/11    Skin    Current Outpatient Prescriptions  Medication Sig Dispense Refill  . ALPRAZolam (XANAX) 0.5 MG tablet Take 0.5 mg by mouth 3 (three) times daily as needed. anxiety      . lidocaine-prilocaine (EMLA) cream Apply topically as needed. Apply to port as needed      . polyethylene  glycol (MIRALAX / GLYCOLAX) packet Take 17 g by mouth daily. constipation       . promethazine (PHENERGAN) 25 MG tablet Take 25 mg by mouth every 6 (six) hours as needed. nausea      . ranitidine (ZANTAC) 150 MG capsule Take 150 mg by mouth 2 (two) times daily as needed. For heartburn      . traMADol (ULTRAM) 50 MG tablet Take 50 mg by mouth every 6 (six) hours as needed. pain        Allergies  Allergen Reactions  . Phenytoin Nausea And Vomiting  . ZOX:[id number]+XBJYNWGNF+AOZHYQMVHQ Acid+Aspartame Other (See Comments)    faint  . Thorazine (Chlorpromazine Hcl) Hives  . Augmentin Other (See Comments)    Syncope  . Ciprofloxacin Nausea And Vomiting  . Vancomycin Rash    Possible rash reported by MD    History   Social History    . Marital Status: Married    Spouse Name: N/A    Number of Children: N/A  . Years of Education: N/A   Occupational History  . Not on file.   Social History Main Topics  . Smoking status: Former Smoker -- 2.0 packs/day for 20 years    Types: Cigarettes    Quit date: 11/01/2010  . Smokeless tobacco: Never Used  . Alcohol Use: No     quit drinking 6 months  . Drug Use: No  . Sexually Active: Yes -- Female partner(s)    Birth Control/ Protection: None     advised not to use methods containing hormones   Other Topics Concern  . Not on file   Social History Narrative   Married 4 years1 son age 16UnemployedGrandfather died unknown malignacyNo family h/o breast, ovarian or colon caMenarche age 13    Family History  Problem Relation Age of Onset  . Cancer Maternal Grandfather     PHYSICAL EXAM: Filed Vitals:   05/26/11 1143  BP: 116/74  Pulse: 124  Weight: 137 lb 8 oz (62.37 kg)  SpO2: 99%    General:  Well appearing. No respiratory difficulty HEENT: normal Neck: supple. no JVD. Carotids 2+ bilat; no bruits. No lymphadenopathy or thryomegaly appreciated. Cor: PMI nondisplaced. Tachycardic with regular rhythm. No rubs, gallops or murmurs. Lungs: clear Abdomen: soft, nontender, nondistended. No hepatosplenomegaly. No bruits or masses. Good bowel sounds. Extremities: no cyanosis, clubbing, rash, edema.  Right arm wrapped with +lymphedema  Neuro: alert & oriented x 3, cranial nerves grossly intact. moves all 4 extremities w/o difficulty. Affect pleasant.  ECG: Sinus tach at 112 bpm, no acute changes.    ASSESSMENT & PLAN:

## 2011-05-26 NOTE — Patient Instructions (Signed)
Your physician has requested that you have an echocardiogram. Echocardiography is a painless test that uses sound waves to create images of your heart. It provides your doctor with information about the size and shape of your heart and how well your heart's chambers and valves are working. This procedure takes approximately one hour. There are no restrictions for this procedure.  Echo in 1 month and follow up with [place] in 2 months  Watch heart rate, if staying above 120 please call.

## 2011-05-27 ENCOUNTER — Ambulatory Visit: Payer: Self-pay

## 2011-05-30 ENCOUNTER — Ambulatory Visit
Admission: RE | Admit: 2011-05-30 | Discharge: 2011-05-30 | Disposition: A | Discharge status: Home / Self Care | Payer: Self-pay | Source: Ambulatory Visit | Attending: Radiation Oncology | Admitting: Radiation Oncology

## 2011-05-30 ENCOUNTER — Other Ambulatory Visit: Payer: Self-pay | Admitting: *Deleted

## 2011-05-30 ENCOUNTER — Ambulatory Visit (HOSPITAL_BASED_OUTPATIENT_CLINIC_OR_DEPARTMENT_OTHER): Payer: Self-pay

## 2011-05-30 ENCOUNTER — Other Ambulatory Visit: Payer: Self-pay | Admitting: Lab

## 2011-05-30 ENCOUNTER — Ambulatory Visit: Payer: Self-pay | Admitting: Physical Therapy

## 2011-05-30 VITALS — BP 117/74 | HR 88 | Temp 98.0°F

## 2011-05-30 DIAGNOSIS — C50919 Malignant neoplasm of unspecified site of unspecified female breast: Secondary | ICD-10-CM

## 2011-05-30 DIAGNOSIS — Z5111 Encounter for antineoplastic chemotherapy: Secondary | ICD-10-CM

## 2011-05-30 LAB — CBC WITH DIFFERENTIAL/PLATELET
BASO%: 0.2 % (ref 0.0–2.0)
EOS%: 2.6 % (ref 0.0–7.0)
MCH: 33.2 pg (ref 25.1–34.0)
MCHC: 35.3 g/dL (ref 31.5–36.0)
MONO#: 0.3 10*3/uL (ref 0.1–0.9)
RBC: 3.73 10*6/uL (ref 3.70–5.45)
RDW: 12.8 % (ref 11.2–14.5)
WBC: 5.4 10*3/uL (ref 3.9–10.3)
lymph#: 1.5 10*3/uL (ref 0.9–3.3)

## 2011-05-30 MED ORDER — ACETAMINOPHEN 325 MG PO TABS
650.0000 mg | ORAL_TABLET | Freq: Once | ORAL | Status: AC
  Administered 2011-05-30: 650 mg via ORAL

## 2011-05-30 MED ORDER — SODIUM CHLORIDE 0.9 % IJ SOLN
10.0000 mL | INTRAMUSCULAR | Status: DC | PRN
  Administered 2011-05-30: 10 mL
  Filled 2011-05-30: qty 10

## 2011-05-30 MED ORDER — HEPARIN SOD (PORK) LOCK FLUSH 100 UNIT/ML IV SOLN
500.0000 [IU] | Freq: Once | INTRAVENOUS | Status: AC | PRN
  Administered 2011-05-30: 500 [IU]
  Filled 2011-05-30: qty 5

## 2011-05-30 MED ORDER — TRASTUZUMAB CHEMO INJECTION 440 MG
6.0000 mg/kg | Freq: Once | INTRAVENOUS | Status: AC
  Administered 2011-05-30: 378 mg via INTRAVENOUS
  Filled 2011-05-30: qty 18

## 2011-05-30 MED ORDER — SODIUM CHLORIDE 0.9 % IV SOLN
Freq: Once | INTRAVENOUS | Status: AC
  Administered 2011-05-30: 15:00:00 via INTRAVENOUS

## 2011-05-30 MED ORDER — DIPHENHYDRAMINE HCL 25 MG PO CAPS
25.0000 mg | ORAL_CAPSULE | Freq: Once | ORAL | Status: AC
  Administered 2011-05-30: 25 mg via ORAL

## 2011-05-31 ENCOUNTER — Ambulatory Visit
Admission: RE | Admit: 2011-05-31 | Discharge: 2011-05-31 | Disposition: A | Discharge status: Home / Self Care | Payer: Self-pay | Source: Ambulatory Visit | Attending: Radiation Oncology | Admitting: Radiation Oncology

## 2011-05-31 ENCOUNTER — Encounter: Payer: Self-pay | Admitting: Radiation Oncology

## 2011-05-31 DIAGNOSIS — C50919 Malignant neoplasm of unspecified site of unspecified female breast: Secondary | ICD-10-CM

## 2011-05-31 NOTE — Progress Notes (Signed)
Pt saw Dr Gala Romney, heart/vascular last week, states EKG normal, dr informed her he "may consider med if her heart rate cont to be high".  Pt states she cont to feel "tightness in middle of chest" from time to time, unsure if this is a daily occurrence. She states she doubled her Xanax per Dr Michell Heinrich, and "it helped some but I felt it again today while waiting in waiting room." She states her husband may contribute to her anxiety at times by talking about things he is concerned about. She states she was "exercising at home  this morning and playing with the dog, which may have made a difference. Pt's O2 sat 99-100% today. Pt's r arm wrapped; she goes 3 x/week for massage and to have arm rewrapped. She states "the fluid has gone down".  Pt states she is having a little itching on chest wall; advised she may apply 1% cortisone cream.

## 2011-05-31 NOTE — Progress Notes (Signed)
Weekly Management Note Current Dose: 19.8  Gy  Projected Dose: 60.4 Gy   Narrative:  The patient presents for routine under treatment assessment.  CBCT/MVCT images/Port film x-rays were reviewed.  The chart was checked. C/o tightness during treatments and on way to hospital. Woke up this mornign and felt fine. Too much xanax makes her sleepy. Missed tx due to being sick and weather. Asked for information about CA2729. Some irritation of right chest wall.   Physical Findings: Weight: 132 lb 1.6 oz (59.92 kg). Slightly pink chest wall.   Impression:  The patient is tolerating radiation.  Plan:  Continue treatment as planned. Try biafene. Gave information about tumor blood tests from [facility] and [facility]. Discussed non specificity for breast cancer recurrence.

## 2011-06-01 ENCOUNTER — Ambulatory Visit: Payer: Self-pay

## 2011-06-02 ENCOUNTER — Ambulatory Visit
Admission: RE | Admit: 2011-06-02 | Discharge: 2011-06-02 | Disposition: A | Discharge status: Home / Self Care | Payer: Self-pay | Source: Ambulatory Visit | Attending: Radiation Oncology | Admitting: Radiation Oncology

## 2011-06-02 ENCOUNTER — Ambulatory Visit: Payer: Self-pay | Admitting: Physical Therapy

## 2011-06-03 ENCOUNTER — Ambulatory Visit
Admission: RE | Admit: 2011-06-03 | Discharge: 2011-06-03 | Disposition: A | Discharge status: Home / Self Care | Payer: Self-pay | Source: Ambulatory Visit | Attending: Radiation Oncology | Admitting: Radiation Oncology

## 2011-06-06 ENCOUNTER — Ambulatory Visit: Payer: Self-pay

## 2011-06-06 ENCOUNTER — Encounter: Payer: Self-pay | Admitting: Physical Therapy

## 2011-06-07 ENCOUNTER — Telehealth: Payer: Self-pay | Admitting: Psychiatry

## 2011-06-07 ENCOUNTER — Encounter: Payer: Self-pay | Admitting: Psychiatry

## 2011-06-07 ENCOUNTER — Ambulatory Visit
Admission: RE | Admit: 2011-06-07 | Discharge: 2011-06-07 | Disposition: A | Discharge status: Home / Self Care | Payer: Self-pay | Source: Ambulatory Visit | Attending: Radiation Oncology | Admitting: Radiation Oncology

## 2011-06-07 VITALS — BP 123/85 | HR 86 | Temp 97.7°F | Wt 130.7 lb

## 2011-06-07 DIAGNOSIS — C50919 Malignant neoplasm of unspecified site of unspecified female breast: Secondary | ICD-10-CM

## 2011-06-07 MED ORDER — BIAFINE EX EMUL
CUTANEOUS | Status: DC | PRN
  Administered 2011-06-07: 1 via TOPICAL

## 2011-06-07 NOTE — Progress Notes (Signed)
Encounter addended by: Amanda Pea, RN on: 06/07/2011  3:37 PM<BR>     Documentation filed: Inpatient MAR, Orders

## 2011-06-07 NOTE — Progress Notes (Signed)
Weekly Management Note Current Dose: 25.2  Gy  Projected Dose: 60.4 Gy   Narrative:  The patient presents for routine under treatment assessment.  CBCT/MVCT images/Port film x-rays were reviewed.  The chart was checked. Feeling tired and depressed. No appetite or energy. Does not want to be on "pills" but wants something to pick her up. Has history of admission to behavioral health x 2. Husband concerned. Tearful on way to clinic appointments. Skin is slightly irritated. Has not picked up hydrocortisone.   Physical Findings: Weight: 130 lb 11.2 oz (59.285 kg). Medial skin is slightly irritated. Rest of chest wall is pink.   Impression:  The patient is tolerating radiation.  Plan:  Continue treatment as planned. Continue radiaplex. Appt with Dr. Noe Gens. Agreed to re-address issue of meds in 1 week.

## 2011-06-07 NOTE — Progress Notes (Signed)
Crystal [name] admits that she feels" depressed" and that she is "so tired", is forgetting to eat, and sleeping at lot.  Admits to crying on her way to treatment today.  States she has seen a "someone" in the past for depression and was on anti-depressants x 3 years but felt the meds made it worse.  Is open to seeing the [facility] psychologist and or Child psychotherapist.   Bright Erythema of right chest wall with the beginnings of a rash in the inner upper portion of treatment field.  C/o itching in tx. with some tendernes.

## 2011-06-08 ENCOUNTER — Encounter: Payer: Self-pay | Admitting: Psychiatry

## 2011-06-08 ENCOUNTER — Ambulatory Visit: Payer: Self-pay | Attending: Radiation Oncology

## 2011-06-08 ENCOUNTER — Ambulatory Visit
Admission: RE | Admit: 2011-06-08 | Discharge: 2011-06-08 | Disposition: A | Discharge status: Home / Self Care | Payer: Self-pay | Source: Ambulatory Visit | Attending: Radiation Oncology | Admitting: Radiation Oncology

## 2011-06-08 ENCOUNTER — Ambulatory Visit: Payer: Self-pay | Admitting: Psychiatry

## 2011-06-08 DIAGNOSIS — M24519 Contracture, unspecified shoulder: Secondary | ICD-10-CM | POA: Insufficient documentation

## 2011-06-08 DIAGNOSIS — IMO0001 Reserved for inherently not codable concepts without codable children: Secondary | ICD-10-CM | POA: Insufficient documentation

## 2011-06-08 DIAGNOSIS — I890 Lymphedema, not elsewhere classified: Secondary | ICD-10-CM | POA: Insufficient documentation

## 2011-06-08 NOTE — Progress Notes (Signed)
06-08-2011  Patient was referred by radiation department and was scheduled yesterday for an initial psychological evaluation today.  She came to the appointment but refused to complete the paperwork despite being assured that with no insurance [facility] would cover the cost of her visit.  She left saying she would call to reschedule after talking with there husband.

## 2011-06-09 ENCOUNTER — Ambulatory Visit
Admission: RE | Admit: 2011-06-09 | Discharge: 2011-06-09 | Disposition: A | Discharge status: Home / Self Care | Payer: Self-pay | Source: Ambulatory Visit | Attending: Radiation Oncology | Admitting: Radiation Oncology

## 2011-06-09 ENCOUNTER — Other Ambulatory Visit (HOSPITAL_BASED_OUTPATIENT_CLINIC_OR_DEPARTMENT_OTHER): Payer: Self-pay | Admitting: Lab

## 2011-06-09 DIAGNOSIS — C50919 Malignant neoplasm of unspecified site of unspecified female breast: Secondary | ICD-10-CM

## 2011-06-09 LAB — CBC WITH DIFFERENTIAL/PLATELET
BASO%: 0.3 % (ref 0.0–2.0)
Basophils Absolute: 0 10*3/uL (ref 0.0–0.1)
EOS%: 1.7 % (ref 0.0–7.0)
HGB: 12.9 g/dL (ref 11.6–15.9)
MCH: 34 pg (ref 25.1–34.0)
RDW: 13 % (ref 11.2–14.5)
lymph#: 1 10*3/uL (ref 0.9–3.3)

## 2011-06-09 LAB — COMPREHENSIVE METABOLIC PANEL
ALT: 11 U/L (ref 0–35)
AST: 16 U/L (ref 0–37)
Albumin: 5 g/dL (ref 3.5–5.2)
BUN: 10 mg/dL (ref 6–23)
Calcium: 10.1 mg/dL (ref 8.4–10.5)
Chloride: 104 mEq/L (ref 96–112)
Potassium: 4.1 mEq/L (ref 3.5–5.3)
Total Protein: 6.6 g/dL (ref 6.0–8.3)

## 2011-06-10 ENCOUNTER — Ambulatory Visit
Admission: RE | Admit: 2011-06-10 | Discharge: 2011-06-10 | Disposition: A | Discharge status: Home / Self Care | Payer: Self-pay | Source: Ambulatory Visit | Attending: Radiation Oncology | Admitting: Radiation Oncology

## 2011-06-10 ENCOUNTER — Ambulatory Visit: Payer: Self-pay | Admitting: Physical Therapy

## 2011-06-10 ENCOUNTER — Other Ambulatory Visit: Payer: Self-pay | Admitting: *Deleted

## 2011-06-10 DIAGNOSIS — C50919 Malignant neoplasm of unspecified site of unspecified female breast: Secondary | ICD-10-CM

## 2011-06-13 ENCOUNTER — Ambulatory Visit
Admission: RE | Admit: 2011-06-13 | Discharge: 2011-06-13 | Disposition: A | Discharge status: Home / Self Care | Payer: Self-pay | Source: Ambulatory Visit | Attending: Radiation Oncology | Admitting: Radiation Oncology

## 2011-06-13 ENCOUNTER — Other Ambulatory Visit (HOSPITAL_BASED_OUTPATIENT_CLINIC_OR_DEPARTMENT_OTHER): Payer: Self-pay | Admitting: Lab

## 2011-06-13 ENCOUNTER — Ambulatory Visit: Payer: Self-pay | Admitting: Physical Therapy

## 2011-06-13 DIAGNOSIS — C50919 Malignant neoplasm of unspecified site of unspecified female breast: Secondary | ICD-10-CM

## 2011-06-13 LAB — COMPREHENSIVE METABOLIC PANEL
AST: 18 U/L (ref 0–37)
Albumin: 4.4 g/dL (ref 3.5–5.2)
Alkaline Phosphatase: 76 U/L (ref 39–117)
Potassium: 4.1 mEq/L (ref 3.5–5.3)
Sodium: 142 mEq/L (ref 135–145)
Total Bilirubin: 0.4 mg/dL (ref 0.3–1.2)
Total Protein: 6.3 g/dL (ref 6.0–8.3)

## 2011-06-13 LAB — CBC WITH DIFFERENTIAL/PLATELET
EOS%: 3.8 % (ref 0.0–7.0)
MCH: 33.7 pg (ref 25.1–34.0)
MCHC: 35.3 g/dL (ref 31.5–36.0)
MCV: 95.3 fL (ref 79.5–101.0)
MONO%: 4.8 % (ref 0.0–14.0)
RBC: 3.76 10*6/uL (ref 3.70–5.45)
RDW: 12.5 % (ref 11.2–14.5)

## 2011-06-14 ENCOUNTER — Ambulatory Visit
Admission: RE | Admit: 2011-06-14 | Discharge: 2011-06-14 | Disposition: A | Discharge status: Home / Self Care | Payer: Self-pay | Source: Ambulatory Visit | Attending: Radiation Oncology | Admitting: Radiation Oncology

## 2011-06-14 ENCOUNTER — Encounter: Payer: Self-pay | Admitting: Specialist

## 2011-06-14 VITALS — Wt 130.0 lb

## 2011-06-14 DIAGNOSIS — C50919 Malignant neoplasm of unspecified site of unspecified female breast: Secondary | ICD-10-CM

## 2011-06-14 MED ORDER — CITALOPRAM HYDROBROMIDE 20 MG PO TABS: 20.0000 mg | ORAL_TABLET | Freq: Every day | ORAL | Status: DC

## 2011-06-14 MED ORDER — HYDROCODONE-ACETAMINOPHEN 5-500 MG PO TABS: 1.0000 | ORAL_TABLET | Freq: Four times a day (QID) | ORAL | Status: AC | PRN

## 2011-06-14 NOTE — Progress Notes (Signed)
I received a referral from Dr. [name] regarding Crystal [name].  Dr. Michell Heinrich stated that the patient had asked to speak to a chaplain.  When I called Crystal [name], she said she was trying to decide if taking anti-depressants indicated that her faith wasn't strong; that if she had strong faith, she wouldn't need pills.  We talked at length, and she said she would fill the prescription Dr. Michell Heinrich had given her and that she would agree to see counseling intern, Juanetta Beets.  I consulted with Dr. Enzo Bi, psychologist, before making the referral to Mr. Read Drivers.  I have asked him to contact the patient and schedule a counseling session.

## 2011-06-14 NOTE — Progress Notes (Signed)
Here for routine under treat weekly radiation visit with MD. Right chest red.pt. States nerve pain is very aggravating, would like something to take to help ease discomfort. Using biafine currently. Occasional fatigue. Right arm bandaged per lymphedema clinic,performing exercises at least twice daily.

## 2011-06-14 NOTE — Progress Notes (Signed)
Weekly Management Note Current Dose: 34.2  Gy  Projected Dose: 60.4 Gy   Narrative:  The patient presents for routine under treatment assessment.  CBCT/MVCT images/Port film x-rays were reviewed.  The chart was checked. C/o stabbing type pain over bilateral chest wall. Worse this week. Keeps her from getting comfortable at night. Believes her low energy and tearfulness were from a lack of carbohydrates and has felt better since starting to eat more bread and pizza.  Did not reschedule with Dr. Noe Gens and would like to talk to chaplain instead. Believes she "can handle it" and is scared that taking anti-depressants again will lead to inpatient treatment.  Last SSRI she was on was celexa and she states she felt ok on this and did not remember having too many side effects.  Right arm is wrapped and feeling better she says.   Physical Findings: Weight: 130 lb (58.968 kg). Right chest wall skin is dry but intact. No skin changes on left.   Impression:  The patient is tolerating radiation.  Plan:  Continue treatment as planned. Nautia agreed to talk to our chaplain. Agreed to pick up Celexa prescription and try it but does not want to tell her husband. "To see if he can tell a difference and not know its the pills." We talked again about the warning signs of depression and intervening early so she did not regress.  Discussed normal anxiety related to end of treatment. I will refer to our chaplain and follow up with her next week. Ideally she should be seen by a psychiatrist but I don't know that she would agree at this time.

## 2011-06-15 ENCOUNTER — Ambulatory Visit
Admission: RE | Admit: 2011-06-15 | Discharge: 2011-06-15 | Disposition: A | Discharge status: Home / Self Care | Payer: Self-pay | Source: Ambulatory Visit | Attending: Radiation Oncology | Admitting: Radiation Oncology

## 2011-06-15 ENCOUNTER — Encounter: Payer: Self-pay | Admitting: Physical Therapy

## 2011-06-16 ENCOUNTER — Ambulatory Visit: Payer: Self-pay | Admitting: Physical Therapy

## 2011-06-16 ENCOUNTER — Ambulatory Visit
Admission: RE | Admit: 2011-06-16 | Discharge: 2011-06-16 | Disposition: A | Discharge status: Home / Self Care | Payer: Self-pay | Source: Ambulatory Visit | Attending: Radiation Oncology | Admitting: Radiation Oncology

## 2011-06-17 ENCOUNTER — Ambulatory Visit
Admission: RE | Admit: 2011-06-17 | Discharge: 2011-06-17 | Disposition: A | Discharge status: Home / Self Care | Payer: Self-pay | Source: Ambulatory Visit | Attending: Radiation Oncology | Admitting: Radiation Oncology

## 2011-06-20 ENCOUNTER — Ambulatory Visit (HOSPITAL_BASED_OUTPATIENT_CLINIC_OR_DEPARTMENT_OTHER): Payer: Self-pay

## 2011-06-20 ENCOUNTER — Ambulatory Visit: Payer: Self-pay | Admitting: Oncology

## 2011-06-20 ENCOUNTER — Ambulatory Visit: Payer: Self-pay

## 2011-06-20 ENCOUNTER — Ambulatory Visit
Admission: RE | Admit: 2011-06-20 | Discharge: 2011-06-20 | Disposition: A | Discharge status: Home / Self Care | Payer: Self-pay | Source: Ambulatory Visit | Attending: Radiation Oncology | Admitting: Radiation Oncology

## 2011-06-20 ENCOUNTER — Telehealth: Payer: Self-pay

## 2011-06-20 VITALS — BP 122/82 | HR 90 | Temp 98.4°F | Ht 64.0 in | Wt 128.1 lb

## 2011-06-20 DIAGNOSIS — C50919 Malignant neoplasm of unspecified site of unspecified female breast: Secondary | ICD-10-CM

## 2011-06-20 DIAGNOSIS — Z5112 Encounter for antineoplastic immunotherapy: Secondary | ICD-10-CM

## 2011-06-20 MED ORDER — SODIUM CHLORIDE 0.9 % IJ SOLN
10.0000 mL | INTRAMUSCULAR | Status: DC | PRN
  Administered 2011-06-20: 10 mL
  Filled 2011-06-20: qty 10

## 2011-06-20 MED ORDER — HEPARIN SOD (PORK) LOCK FLUSH 100 UNIT/ML IV SOLN
500.0000 [IU] | Freq: Once | INTRAVENOUS | Status: AC | PRN
  Administered 2011-06-20: 500 [IU]
  Filled 2011-06-20: qty 5

## 2011-06-20 MED ORDER — SODIUM CHLORIDE 0.9 % IV SOLN
Freq: Once | INTRAVENOUS | Status: AC
  Administered 2011-06-20: 15:00:00 via INTRAVENOUS

## 2011-06-20 MED ORDER — TRASTUZUMAB CHEMO INJECTION 440 MG
6.0000 mg/kg | Freq: Once | INTRAVENOUS | Status: AC
  Administered 2011-06-20: 378 mg via INTRAVENOUS
  Filled 2011-06-20: qty 18

## 2011-06-20 MED ORDER — ACETAMINOPHEN 325 MG PO TABS
650.0000 mg | ORAL_TABLET | Freq: Once | ORAL | Status: AC
  Administered 2011-06-20: 650 mg via ORAL

## 2011-06-20 NOTE — Telephone Encounter (Signed)
.  [facility]    PT REQUESTING ANXIETY MEDS REFILL WITHOUT COMING IN SINCE SHE IS TAKING CHEMO/RADIATION AND DOES'NT  WANT TO EXPOSE HERSELF.   BEST PHONES  [phone] OR [phone]

## 2011-06-20 NOTE — Telephone Encounter (Signed)
MR Please help [place] find chart. French Ana has looked everywhere and is unable to find. Thanks

## 2011-06-20 NOTE — Patient Instructions (Signed)
[facility] Discharge Instructions for Patients Receiving Chemotherapy  Today you received the following chemotherapy agents Herceptin.  To help prevent nausea and vomiting after your treatment, we encourage you to take your nausea medication as prescribed by your physician.  If you develop nausea and vomiting that is not controlled by your nausea medication, call the clinic. If it is after clinic hours your family physician or the after hours number for the clinic or go to the Emergency Department.   BELOW ARE SYMPTOMS THAT SHOULD BE REPORTED IMMEDIATELY:  *FEVER GREATER THAN 100.5 F  *CHILLS WITH OR WITHOUT FEVER  NAUSEA AND VOMITING THAT IS NOT CONTROLLED WITH YOUR NAUSEA MEDICATION  *UNUSUAL SHORTNESS OF BREATH  *UNUSUAL BRUISING OR BLEEDING  TENDERNESS IN MOUTH AND THROAT WITH OR WITHOUT PRESENCE OF ULCERS  *URINARY PROBLEMS  *BOWEL PROBLEMS  UNUSUAL RASH Items with * indicate a potential emergency and should be followed up as soon as possible.  Feel free to call the clinic you have any questions or concerns. The clinic phone number is [phone].   I have been informed and understand all the instructions given to me. I know to contact the clinic, my physician, or go to the Emergency Department if any problems should occur. I do not have any questions at this time, but understand that I may call the clinic during office hours   should I have any questions or need assistance in obtaining follow up care.    __________________________________________  _____________  __________ Signature of Patient or Authorized Representative            Date                   Time    __________________________________________ Nurse's Signature    

## 2011-06-20 NOTE — Progress Notes (Signed)
Crystal [name] refused taking benedryl, she states she has lots to do when she leaves [facility] and does not want to be sleepy, as she is driving.

## 2011-06-20 NOTE — Progress Notes (Signed)
ID: Crystal [name]   DOB: 04/23/1976  MR#: [id number]  WUJ#:[id number]  HISTORY OF PRESENT ILLNESS: The patient is a 36 year old [place] woman who noted a mass in her right breast and brought it to her primary care physician's attention June of 2012. She was set up for mammography at [facility], where an area of calcifications highly suspicious for carcinoma was biopsied. This was in the upper outer quadrant and it measured 1.7 cm mammographically and 1.8 cm by ultrasonography. The pathology report ([id number]) showed a high-grade invasive ductal carcinoma estrogen receptor positive at 64% progesterone receptor positive at 18%, and HER-2 amplified with a ratio by CISH of 5.24. MIB-1-1 was 65%. A second mass biopsied at the same time was also triple positive.  Given her multiple masses in the right breast, mastectomy was recommended. The patient actually opted for bilateral mastectomies and this was performed together with a right axillary lymph node dissection under Dr. Felicity [name] on 11/16/2010. The final pathology [id number]) showed, on the left, no evidence of cancer. On the right the patient had a high-grade invasive ductal carcinoma measuring 1.9 cm, with negative margins, grade 3, involving 2 of 16 lymph nodes (stage IIB).  Crystal [name] was treated in the adjuvant setting, with 6 doses of docetaxel/carboplatin/trastuzumab, completed December 2012. The plan is to proceed to radiation therapy, continue trastuzumab for total of one year, and also began on antiestrogen therapy once radiation has been completed.  INTERVAL HISTORY: She was extremely tired and somewhat depressed. She was started on Celexa 20 mg 6 days ago. She feels "much better". She is not sure if it is the Celexa or not but overall she is feeling more "happy inside", and much more stable. She is also meeting with our counselor upstairs.  REVIEW OF SYSTEMS: She still has pain at the surgical site. She has some lymphedema in the right upper  extremity and this is being work done through rehabilitation. Her pelvic pain is also much better. She has not had a period since September. She understands that this does not mean she could not get pregnant. She has hot flashes particularly at night, and if these continue then perhaps gabapentin at that time might be a good idea. Otherwise a detailed review of systems was noncontributory  PAST MEDICAL HISTORY: Past Medical History  Diagnosis Date  . Allergy   . Right breast ca dx'd 10/2010  . Breast cancer 12/03/10  . Status post chemotherapy     docetaxel/carboplatin/trastuzumab  . Anxiety   . Seizures     last seizure 2 years ago, diagnosed 3 years ago  . Hx MRSA infection 03/28/11    Skin  1. History of seizures. The patient has been thoroughly evaluated both here by Dr. Sharene [name] and at [facility] for this. Among many studies, she had an MRI of the brain on August 28th. This showed an incidental solitary small lesion in the left frontoparietal white matter. It was not felt to be suggestive of multiple sclerosis. A noncontrasted CT in August 2010 was negative. The patient has been off all seizure medications now for 2 months. There has been no evidence of seizure recurrence. 2. Complex psychology history (please refer to Dr. Runell [name] note in the E-chart from August 29, 2009) with evidence of anxiety disorder, depression and psychosis. The patient is currently on no medications for this and denies any of these symptoms at present. 3. History of migraines. 4. Remote history of multidrug abuse. 5. Status post wisdom teeth extraction.  History  of MRSA skin infection   PAST SURGICAL HISTORY: Past Surgical History  Procedure Date  . Wisdom tooth extraction   . Breast surgery 11/16/2010    bilateral mastectomy+Ralnd,T1cN1a, Her2+,ERPR+  . Biopsy breast 10/14/2010    right needle core biopsy  . Portacath placement 11/16/2010    placement of left subclavian port    FAMILY HISTORY Family  History  Problem Relation Age of Onset  . Cancer Maternal Grandfather   The patient's mother is living, in her mid 98s, with no history of breast or ovarian cancer. The patient does not know her biological father. The patient has an older brother with bipolar disease, and according to the patient, borderline schizophrenia.   GYNECOLOGIC HISTORY: Menarche at age 27. She is G1, P1 with first pregnancy to term at age 15. The patient had significant pelvic pain recently and was evaluated with transvaginal and pelvic ultrasound on July 17th by Dr. Natale [name]. This showed normal uterine myometrium and endometrium with bilateral functional ovarian cysts. There were no worrisome features. The patient had a Pap smear which was unremarkable on July 26th.  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 Her husband of 4 years, Crystal [name], works as a Administrator. In addition to them at home is the patient's son, Crystal [name], 43 years old attends [name] in the tenth grade. [name], according to the patient, has significant nausea problems as indeed the patient did previously. The patient attends a local [address]. 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    ADVANCED DIRECTIVES:  HEALTH MAINTENANCE: History  Substance Use Topics  . Smoking status: Former Smoker -- 2.0 packs/day for 20 years    Types: Cigarettes    Quit date: 11/01/2010  . Smokeless tobacco: Never Used  . Alcohol Use: No     quit drinking 6 months     Colonoscopy:  PAP:  Bone density:  Lipid panel:  Allergies  Allergen Reactions  . Phenytoin Nausea And Vomiting  . AVW:UJWJXBJYNWG+NFAOZHYQM+VHQIONGEXB Acid+Aspartame Other (See Comments)    faint  . Thorazine (Chlorpromazine Hcl) Hives  . Augmentin Other (See Comments)    Syncope  . Ciprofloxacin Nausea And Vomiting  . Vancomycin Rash    Possible rash reported by MD    Current Outpatient Prescriptions  Medication Sig  Dispense Refill  . ALPRAZolam (XANAX) 0.5 MG tablet Take 0.5 mg by mouth 3 (three) times daily as needed. anxiety      . citalopram (CELEXA) 20 MG tablet Take 1 tablet (20 mg total) by mouth daily.  30 tablet  1  . emollient (BIAFINE) cream Apply topically as needed.      Marland Kitchen HYDROcodone-acetaminophen (VICODIN) 5-500 MG per tablet Take 1 tablet by mouth every 6 (six) hours as needed for pain.  45 tablet  0  . lidocaine-prilocaine (EMLA) cream Apply topically as needed. Apply to port as needed      . polyethylene glycol (MIRALAX / GLYCOLAX) packet Take 17 g by mouth daily. constipation       . promethazine (PHENERGAN) 25 MG tablet Take 25 mg by mouth every 6 (six) hours as needed. nausea      . ranitidine (ZANTAC) 150 MG capsule Take 150 mg by mouth 2 (two) times daily as needed. For heartburn        OBJECTIVE: Filed Vitals:   06/20/11 1348  BP: 122/82  Pulse: 90  Temp: 98.4 F (36.9 C)     Body  mass index is 21.99 kg/(m^2).    ECOG FS: 1  Sclerae unicteric Oropharynx clear No peripheral adenopathy Lungs no rales or rhonchi Heart regular rate and rhythm Abd benign MSK no focal spinal tenderness, 1+ edema of the right upper extremity, with sleeve and glove in place Neuro: nonfocal Breasts: Status post bilateral mastectomy. She has erythema and minimal dry desquamation over the radiation port on the right. There is no evidence of local recurrence  LAB RESULTS: Lab Results  Component Value Date   WBC 4.0 06/13/2011   NEUTROABS 2.6 06/13/2011   HGB 12.7 06/13/2011   HCT 35.8 06/13/2011   MCV 95.3 06/13/2011   PLT 150 06/13/2011      Chemistry      Component Value Date/Time   NA 142 06/13/2011 1247   K 4.1 06/13/2011 1247   CL 104 06/13/2011 1247   CO2 25 06/13/2011 1247   BUN 22 06/13/2011 1247   CREATININE 0.83 06/13/2011 1247      Component Value Date/Time   CALCIUM 10.1 06/13/2011 1247   ALKPHOS 76 06/13/2011 1247   AST 18 06/13/2011 1247   ALT 11 06/13/2011 1247   BILITOT 0.4  06/13/2011 1247       Lab Results  Component Value Date   LABCA2 18 06/09/2011    No results found for this basename: INR:1;PROTIME:1 in the last 168 hours  No results found for this basename: UACOL:1,UAPR:1,USPG:1,UPH:1,UTP:1,UGL:1,UKET:1,UBIL:1,UHGB:1,UNIT:1,UROB:1,ULEU:1,UEPI:1,UWBC:1,URBC:1,UBAC:1,CAST:1,CRYS:1,UCOM:1,BILUA:1 in the last 72 hours   STUDIES: No new results found.  ASSESSMENT: 36 year old [place] woman, known to be BRCA1 and 2 negative,   (1) status post bilateral mastectomies May of 2012 for a right-sided T1c N1 (stage II) grade 3 invasive ductal carcinoma,estrogen and progesterone receptor positive, HER-2 amplified, with an MIB-1-1 of 85%.   (2) status post carboplatin/docetaxol/trastuzumab x6, completed mid December 2012   (3) continuing on trastuzumab every 3 weeks .   (4) receiving radiation therapy, after which she'll start anti-estrogen therapy    PLAN: She is more stable and that is a very good thing. She is tolerating the trastuzumab without any side effects that she is aware of. The plan is to continue radiation, then C. as needed March. At that time we will start tamoxifen. She knows to call for any problems that may develop before that visit.  Lynnell Fiumara C    06/20/2011

## 2011-06-21 ENCOUNTER — Encounter: Payer: Self-pay | Admitting: Radiation Oncology

## 2011-06-21 ENCOUNTER — Ambulatory Visit
Admission: RE | Admit: 2011-06-21 | Discharge: 2011-06-21 | Disposition: A | Discharge status: Home / Self Care | Payer: Self-pay | Source: Ambulatory Visit | Attending: Radiation Oncology | Admitting: Radiation Oncology

## 2011-06-21 DIAGNOSIS — C50919 Malignant neoplasm of unspecified site of unspecified female breast: Secondary | ICD-10-CM

## 2011-06-21 NOTE — Progress Notes (Signed)
Pt in good spirits, mentally in a better place stated patient, states"I think the Celexa is already working", allergies bothersome, right chest wall erythema and slight peeling in area on chest and also on back, neck also slight erythema, sae Crystal [name] ,Intern weekly,saw him today and likes the sessions, no c/o pain today, no longer taking pain meds, still wearing lymphedema sleve right arm and glove1:41 PM

## 2011-06-21 NOTE — Progress Notes (Signed)
Weekly Management Note Current Dose:  43.2 Gy  Projected Dose: 60.4 Gy   Narrative:  The patient presents for routine under treatment assessment.  CBCT/MVCT images/Port film x-rays were reviewed.  The chart was checked. Doing well. In better spirits on Celexa and with counseling. Some skin irriatation. Continues herceptin.   Physical Findings: Weight: 130 lb 6.4 oz (59.149 kg). Dry desquamatio nover chest wall, sclv and axilla.  Impression:  The patient is tolerating radiation.  Plan:  Continue treatment as planned. Continue counseling. Long term goal should be follow up with pcp or psychiatrist. Continue biafene on skin.

## 2011-06-22 ENCOUNTER — Ambulatory Visit: Payer: Self-pay

## 2011-06-23 ENCOUNTER — Ambulatory Visit
Admission: RE | Admit: 2011-06-23 | Discharge: 2011-06-23 | Disposition: A | Discharge status: Home / Self Care | Payer: Self-pay | Source: Ambulatory Visit | Attending: Radiation Oncology | Admitting: Radiation Oncology

## 2011-06-23 ENCOUNTER — Ambulatory Visit: Payer: Self-pay | Admitting: Radiation Oncology

## 2011-06-24 ENCOUNTER — Ambulatory Visit: Payer: Self-pay

## 2011-06-27 ENCOUNTER — Ambulatory Visit
Admission: RE | Admit: 2011-06-27 | Discharge: 2011-06-27 | Disposition: A | Discharge status: Home / Self Care | Payer: Self-pay | Source: Ambulatory Visit | Attending: Radiation Oncology | Admitting: Radiation Oncology

## 2011-06-27 ENCOUNTER — Ambulatory Visit: Payer: Self-pay

## 2011-06-28 ENCOUNTER — Ambulatory Visit
Admission: RE | Admit: 2011-06-28 | Discharge: 2011-06-28 | Disposition: A | Discharge status: Home / Self Care | Payer: Self-pay | Source: Ambulatory Visit | Attending: Radiation Oncology | Admitting: Radiation Oncology

## 2011-06-28 ENCOUNTER — Other Ambulatory Visit: Payer: Self-pay | Admitting: Physician Assistant

## 2011-06-28 ENCOUNTER — Encounter: Payer: Self-pay | Admitting: Radiation Oncology

## 2011-06-28 ENCOUNTER — Telehealth: Payer: Self-pay | Admitting: Oncology

## 2011-06-28 ENCOUNTER — Ambulatory Visit (HOSPITAL_COMMUNITY)
Admission: RE | Admit: 2011-06-28 | Discharge: 2011-06-28 | Disposition: A | Discharge status: Home / Self Care | Payer: Self-pay | Source: Ambulatory Visit | Attending: Internal Medicine | Admitting: Internal Medicine

## 2011-06-28 DIAGNOSIS — I059 Rheumatic mitral valve disease, unspecified: Secondary | ICD-10-CM | POA: Insufficient documentation

## 2011-06-28 DIAGNOSIS — C50919 Malignant neoplasm of unspecified site of unspecified female breast: Secondary | ICD-10-CM

## 2011-06-28 DIAGNOSIS — R569 Unspecified convulsions: Secondary | ICD-10-CM

## 2011-06-28 DIAGNOSIS — I369 Nonrheumatic tricuspid valve disorder, unspecified: Secondary | ICD-10-CM

## 2011-06-28 DIAGNOSIS — I379 Nonrheumatic pulmonary valve disorder, unspecified: Secondary | ICD-10-CM | POA: Insufficient documentation

## 2011-06-28 DIAGNOSIS — Z09 Encounter for follow-up examination after completed treatment for conditions other than malignant neoplasm: Secondary | ICD-10-CM | POA: Insufficient documentation

## 2011-06-28 DIAGNOSIS — I079 Rheumatic tricuspid valve disease, unspecified: Secondary | ICD-10-CM | POA: Insufficient documentation

## 2011-06-28 MED ORDER — BIAFINE EX EMUL
Freq: Two times a day (BID) | CUTANEOUS | Status: DC
  Administered 2011-06-28: 14:00:00 via TOPICAL

## 2011-06-28 MED ORDER — HYDROCODONE-ACETAMINOPHEN 5-500 MG PO TABS: 1.0000 | ORAL_TABLET | Freq: Four times a day (QID) | ORAL | Status: DC | PRN

## 2011-06-28 NOTE — Progress Notes (Signed)
Pt states Hydrocodone 5-500 gives relief of chest wall "nerve pain". She states she takes daily, up to 3 x/day. Pt states she had [employer] coffee today, feels this contributed to increased HR. She had ECHO this morning, states HR was 70. Pt reports loss of appetite, states she "is eating healthier, organic, salads, fruits". Biafine to tx area; gave pt another tube.

## 2011-06-28 NOTE — Progress Notes (Signed)
Weekly Management Note Current Dose:  48.6 Gy  Projected Dose: 60.4 Gy   Narrative:  The patient presents for routine under treatment assessment.  CBCT/MVCT images/Port film x-rays were reviewed.  The chart was checked. Thinks celexa is helpful. Some irritation. Needs biafene. Elige Radon is helfpul.  Would like to follow up with PCP for anxiety issues. Marked out electron scar boost today. vicodin tid working for rib pain. Losing weight   Physical Findings: Weight: 122 lb 8 oz (55.566 kg). Dry desquamation over chest wall. Worse in axilla.   Impression:  The patient is tolerating radiation.  Plan:  Continue treatment as planned. F/u with pcp for anxiety. vicodin refilled. Discussed lotion with vit e. F/u 1 mo. Reg scheduled f/u with med onc.

## 2011-06-28 NOTE — Telephone Encounter (Signed)
S./w amy the pa and she is aware of the moved md appt from 07/19/2011 to 07/12/2011. Pt gets herceptin q three weeks. gve the pt a march 2013 appt calendar

## 2011-06-29 ENCOUNTER — Encounter: Payer: Self-pay | Admitting: Oncology

## 2011-06-29 ENCOUNTER — Ambulatory Visit
Admission: RE | Admit: 2011-06-29 | Discharge: 2011-06-29 | Disposition: A | Discharge status: Home / Self Care | Payer: Self-pay | Source: Ambulatory Visit | Attending: Radiation Oncology | Admitting: Radiation Oncology

## 2011-06-29 ENCOUNTER — Encounter: Payer: Self-pay | Admitting: Radiation Oncology

## 2011-06-29 NOTE — Progress Notes (Signed)
Name: Crystal [name]   MRN: [id number]  Date:  06/29/2011   DOB: [birth year]  Status:outpatient    DIAGNOSIS: Breast cancer.  CONSENT VERIFIED: yes   SET UP: Patient is setup supine   IMMOBILIZATION:  The following immobilization was used:Custom Moldable Pillow, breast board.   NARRATIVE: [name] underwent complex simulation and treatment planning for her boost treatment today.  I previously marked out at 2 cm margin around her scar on the treatment machine.  The depth of her chest wall 1.2 cm.   6  MeV electrons will be prescribed to the 90% isodose line. 6mm of bolus will be custom made and placed daily for her treatment.   A block will be used for beam modification purposes.  A special port plan is requested.

## 2011-06-29 NOTE — Progress Notes (Signed)
Patient stopped by to get a new EPP application to reapply, advised patient that we needed updated information, patient stated she would supply all information needed.

## 2011-06-30 ENCOUNTER — Ambulatory Visit: Payer: Self-pay

## 2011-07-01 ENCOUNTER — Ambulatory Visit: Payer: Self-pay

## 2011-07-04 ENCOUNTER — Encounter (INDEPENDENT_AMBULATORY_CARE_PROVIDER_SITE_OTHER): Payer: Self-pay | Admitting: General Surgery

## 2011-07-04 ENCOUNTER — Ambulatory Visit (INDEPENDENT_AMBULATORY_CARE_PROVIDER_SITE_OTHER): Payer: Self-pay | Admitting: General Surgery

## 2011-07-04 ENCOUNTER — Encounter: Payer: Self-pay | Admitting: Physical Therapy

## 2011-07-04 ENCOUNTER — Ambulatory Visit
Admission: RE | Admit: 2011-07-04 | Discharge: 2011-07-04 | Disposition: A | Discharge status: Home / Self Care | Payer: Self-pay | Source: Ambulatory Visit | Attending: Radiation Oncology | Admitting: Radiation Oncology

## 2011-07-04 VITALS — BP 98/62 | HR 100 | Temp 97.0°F | Ht 64.0 in | Wt 120.2 lb

## 2011-07-04 DIAGNOSIS — C50919 Malignant neoplasm of unspecified site of unspecified female breast: Secondary | ICD-10-CM

## 2011-07-04 NOTE — Patient Instructions (Signed)
Need to eat and drink more. Try to avoid caffeine

## 2011-07-04 NOTE — Progress Notes (Signed)
Subjective:     Patient ID: Crystal [name], female   DOB: 07-17-1975, 36 y.o.   MRN: [id number]  HPI The patient is a 36 her white female who is 9 months out from bilateral mastectomies and a right axillary lymph node dissection for a T1c N1a right breast cancer. She has finished her chemotherapy except for the Herceptin. Her radiation therapy will finish next week. Her main complaint is of not having an appetite and losing weight. She started at about 150 pounds and is now down to 120.  Review of Systems  Constitutional: Positive for appetite change and fatigue.  HENT: Negative.   Eyes: Negative.   Respiratory: Negative.   Cardiovascular: Negative.   Gastrointestinal: Negative.   Genitourinary: Negative.   Musculoskeletal: Negative.   Skin: Negative.   Neurological: Positive for light-headedness.  Hematological: Negative.   Psychiatric/Behavioral: Negative.        Objective:   Physical Exam  Constitutional: She is oriented to person, place, and time. She appears well-developed and well-nourished.  HENT:  Head: Normocephalic and atraumatic.  Eyes: Conjunctivae and EOM are normal. Pupils are equal, round, and reactive to light.  Neck: Normal range of motion. Neck supple.  Cardiovascular: Normal rate, regular rhythm and normal heart sounds.   Pulmonary/Chest: Effort normal and breath sounds normal.       No palpable mass on either chest wall. No axillary supraclavicular or cervical lymphadenopathy. The skin of her right chest wall is mildly red from her radiation  Abdominal: Soft. Bowel sounds are normal. She exhibits no mass. There is no tenderness.  Musculoskeletal: Normal range of motion.       Slight swelling of the right arm  Lymphadenopathy:    She has no cervical adenopathy.  Neurological: She is alert and oriented to person, place, and time.  Skin: Skin is warm and dry.  Psychiatric: She has a normal mood and affect. Her behavior is normal.       Assessment:     9  months status post bilateral mastectomies and right axillary lymph node dissection    Plan:     At this point I would like her to contact her medical oncologist about her weight loss and loss of appetite. I do think she is also a little bit dehydrated. She seems to be drinking a lot of caffeine type drinks. I recommended more Gatorade and water and possibly some dietary supplements with lots of protein. We will plan to see her back in about 3 months.

## 2011-07-05 ENCOUNTER — Ambulatory Visit
Admission: RE | Admit: 2011-07-05 | Discharge: 2011-07-05 | Disposition: A | Discharge status: Home / Self Care | Payer: Self-pay | Source: Ambulatory Visit | Attending: Radiation Oncology | Admitting: Radiation Oncology

## 2011-07-05 ENCOUNTER — Encounter: Payer: Self-pay | Admitting: Radiation Oncology

## 2011-07-05 VITALS — Wt 128.4 lb

## 2011-07-05 DIAGNOSIS — C50919 Malignant neoplasm of unspecified site of unspecified female breast: Secondary | ICD-10-CM

## 2011-07-05 NOTE — Progress Notes (Signed)
Here for routine weekly md assessment for radiation of right chest wall. Mild hyperpigmentation. Today is 3 of 5 boost treatments as pt. Completes radiation this Friday. Increased fatigue towards the end of week. Improved appetite.

## 2011-07-05 NOTE — Progress Notes (Signed)
DIAGNOSIS:  Right breast cancer.  NARRATIVE:  Crystal [name] is seen today for weekly assessment.  She has completed 5440 cGy of a planned 6040 cGy directed at the right chest wall area.  The patient continues to tolerate treatments well.  She has more fatigue towards the end of the week.  She denies any significant pruritus within the treatment area or discomfort.  EXAMINATION:  Lungs:  Clear.  Heart:  Regular rhythm and rate. Examination of the right chest wall area reveals hyperpigmentation changes and erythema and dry desquamation but no moist desquamation.  IMPRESSION AND PLAN:  The patient is tolerating her radiation treatments well at this time.  The patient's radiation fields are setting up accurately.  The patient's radiation chart was checked today.  Plan is to continue with post-mastectomy irradiation to a cumulative dose of 6040 cGy.    ______________________________ [name], Ph.D., M.D. JDK/MEDQ  D:  07/05/2011  T:  07/05/2011  Job:  [id number]

## 2011-07-06 ENCOUNTER — Encounter: Payer: Self-pay | Admitting: Radiation Oncology

## 2011-07-06 ENCOUNTER — Ambulatory Visit
Admission: RE | Admit: 2011-07-06 | Discharge: 2011-07-06 | Disposition: A | Discharge status: Home / Self Care | Payer: Self-pay | Source: Ambulatory Visit | Attending: Radiation Oncology | Admitting: Radiation Oncology

## 2011-07-06 DIAGNOSIS — C50919 Malignant neoplasm of unspecified site of unspecified female breast: Secondary | ICD-10-CM

## 2011-07-06 NOTE — Progress Notes (Signed)
[facility]    Radiation Oncology [address] [place]     Crystal [name], M.D. [place], [place] [address]               [name], M.D., Ph.D. Phone: [phone]      [name] A. Kathrynn Running, M.D. Fax: [phone]      [name], M.D., Ph.D.         [name], M.D.         [name], M.D Weekly Treatment Management Note  Name: Crystal [name]     MRN: [id number]        CSN: [id number] Date: 07/06/2011      DOB: [birth year]  CC: [name], MD, MD         [place]    Status: Outpatient  Diagnosis: The encounter diagnosis was Breast cancer.  Current Dose: 5640  Current Fraction: 31  Planned Dose: 6040 cGY  Narrative: Crystal [name] was seen today for weekly treatment management. The chart was checked and port films  were reviewed. She is tolerating her treatments well. She has minimal pruritus/discomfort in the right chest wall area. She does have a fair amount of fatigue at this time.  Phenytoin; MWN:UUVOZDGUYQI+HKVQQVZDG+LOVFIEPPIR acid+aspartame; Thorazine; Augmentin; Ciprofloxacin; and Vancomycin allergies   Current Outpatient Prescriptions  Medication Sig Dispense Refill  . ALPRAZolam (XANAX) 0.5 MG tablet Take 0.5 mg by mouth 3 (three) times daily as needed. anxiety      . citalopram (CELEXA) 20 MG tablet Take 1 tablet (20 mg total) by mouth daily.  30 tablet  1  . emollient (BIAFINE) cream Apply topically as needed.      Marland Kitchen HYDROcodone-acetaminophen (VICODIN) 5-500 MG per tablet Take 1 tablet by mouth every 6 (six) hours as needed.  60 tablet  0  . lidocaine-prilocaine (EMLA) cream Apply topically as needed. Apply to port as needed      . polyethylene glycol (MIRALAX / GLYCOLAX) packet Take 17 g by mouth daily. constipation       . promethazine (PHENERGAN) 25 MG tablet Take 25 mg by mouth every 6 (six) hours as needed. nausea      . ranitidine (ZANTAC) 150 MG capsule Take 150 mg by mouth 2 (two) times daily as needed. For heartburn        Labs:  Lab Results  Component Value Date   WBC 4.0 06/13/2011   HGB 12.7 06/13/2011   HCT 35.8 06/13/2011   MCV 95.3 06/13/2011   PLT 150 06/13/2011   Lab Results  Component Value Date   CREATININE 0.83 06/13/2011   BUN 22 06/13/2011   NA 142 06/13/2011   K 4.1 06/13/2011   CL 104 06/13/2011   CO2 25 06/13/2011   Lab Results  Component Value Date   ALT 11 06/13/2011   AST 18 06/13/2011   BILITOT 0.4 06/13/2011    Physical Examination:  vitals were not taken for this visit.   Wt Readings from Last 3 Encounters:  07/05/11 128 lb 6.4 oz (58.242 kg)  07/04/11 120 lb 3.2 oz (54.522 kg)  06/28/11 122 lb 8 oz (55.566 kg)     Lungs - Normal respiratory effort, chest expands symmetrically. Lungs are clear to auscultation, no crackles or wheezes.  Heart has regular rhythm and rate  Abdomen is soft and non tender with normal bowel sounds Examination of the right chest area reveals erythema and hyperpigmentation changes as well as dry desquamation, there is no moist desquamation noted  in the treatment area.   Assessment:  Patient tolerating treatments well  Plan: Continue treatment per original radiation prescription

## 2011-07-06 NOTE — Progress Notes (Signed)
Completes treatment of right chestwall on Friday.No skin changes. Increased fatigue.Knows to continue gel and switc to vitamie e lotion.

## 2011-07-07 ENCOUNTER — Ambulatory Visit: Payer: Self-pay

## 2011-07-07 NOTE — Progress Notes (Signed)
Individual Counseling Session  Data: Pt is a married white female presenting to session #1 per referral from [facility]. Pt presents to session dressed casually and to season/circumstance; is wearing knitted cap and pressure sleeve on rt arm. Pt of normal ht/wt for age. Pt alert and oriented to person/place/situation/time, appearance/hygeine within normal limits (WNL), normal eye contact, speech normal rate/volume. Pt is calm though occ fidgets, initially anxious/reserved but became more relaxed in session, mood mild anxious with congruent affect. Pt thought process is coherent, thought content WNL. Pt denies audio-visual hallucinations; denies suicidal/homicidal ideation/intent/plan at current, x80mo, denies hx. No sensory impairments noted.  Assessment: Presenting Problem: Pt reports feeling anxious and on edge as well has having episodes of depressed mood AEB wanting to sleep often most days, feeling down, lack of energy; pt reports other times she will feel full of energy w/ racing thoughts; occ free-floating anxiety. Pt reports recently being rx Celexa from Dr. Michell Heinrich in [facility] and agreed to take it despite being skeptical of medicine. Hx of presenting problem: Pt reports hx of bipolar dx but unsure if she fits that dx, reports hx of inpatient hosp in past. Pt reports that mood was under control for a while, but resurfaced during chemo tx for cancer.  Medications: Celexa Family Hx: Pt reports that her brother has mood d/o, possibly bipolar dx Family Structure: Pt reports she is married and has 14y/o son, reports that both are supportive of her in her cancer recovery Medical: Pt undergoing chemo for breast cancer Strengths/Needs/Abilities/Preferences: Pt has supportive husband and child-rearing responsibilities, pt is attentive to nutrition and exercise; pt is skeptical of medical intervention and has hx of discontinuing MH services Dx Impression/Case Conceptualization: Dx  impression - R/o Bipolar I or Bipolar II, possibly cyclothymia; mood swings appear less severe than Bipolar I. Pt appears to lack education of mood d/o and available tx/coping mechs available to overcome it. Interventions: Facilitated intake session w/ pt, oriented pt to services, explained confidentiality and pt rights, obtained informed consent - copy of prof disclosure given to pt, developed goals/tx plan w/ pt, collected info to determine necessity for services. Assessed for SI/HI. Responses: Pt stated understanding of services, rights, confidentiality, goals/tx plan. Pt provided info for assessment. Pt participated in the development of goals/tx plan and identified goals. Pt denied SI/HI.  Plan: Goal: Pt wants to find ways to manage mood. Objective: Pt will learn and consistently use at least 2 active coping skills in next 26mo. Intervention: Counselor will provide empathy, reflective listening, and unconditional positive regard. Counselor will incorporate bx modeling, DBT/ACT skills in order to assist pt w/ development of coping techs and enhanced insight; to assist ct to ID, learn, utilize emotion coping strategies.  Chardonay Scritchfield B MS, LPCA, NCC

## 2011-07-07 NOTE — Progress Notes (Signed)
Individual Counseling Session  Data: Pt presents to session #2 dressed casually and to season/circumstance. No change in ht/wt since last session. Pt alert and oriented to person/place/situation/time, appearance/hygeine within normal limits (WNL), normal eye contact, speech normal rate/volume. Pt is calm, mood euthymic, affect normal range/variability - improved from last session. Pt thought process is coherent, thought content WNL. Pt denies audio-visual hallucinations; denies SI/HI. No sensory impairments noted. Changes since last session: Pt reports improved mood between sessions AEB decreased anxiety/depressed mood, feeling energized, increased motivation.  Assessment: Case Conceptualization: Pt appears to better understand mood states and how to recognize them, but still needs assistance learning to manage/maintain mood. Interventions: Checked in w/ pt to assess progress toward goals. Engaged pt in session via active listening to build rapport. Highlighted pt's mood improvement and externalized ways pt can maintain improved mood via wellness plan. Gave pt homework to attend yoga class at [facility] at least 1x between sessions. Response: Pt reports moderate progress toward goal (see below). Pt reports she found out she cancer is in remission, prompting mood improvement. Pt reports feeling like she has reached important milestone and this gives her reason to try to feel better. Pt reports understanding fluctuations of mood and how to recognize them, states Celexa appears to be helping, states that she is feeling "level" now. Pt reports that she wants to maintain current mood, states exercise is way she wants to do that, agreed to go to yoga class between sessions and liked idea of developing wellness recovery plan in future sessions.  Plan: Progress Toward Goals: Pt reports mood has improved w/ medicine and positive outlook. Plan: Next session to provide additional psychoeducation on mood mgmt  and engage pt in wellness recovery plan to maintain stable mood and prevent crisis/relapse into depressed or elevated mood.  Josclyn Rosales B MS, LPCA, NCC

## 2011-07-08 ENCOUNTER — Ambulatory Visit: Payer: Self-pay

## 2011-07-11 ENCOUNTER — Ambulatory Visit
Admission: RE | Admit: 2011-07-11 | Discharge: 2011-07-11 | Disposition: A | Discharge status: Home / Self Care | Payer: Self-pay | Source: Ambulatory Visit | Attending: Radiation Oncology | Admitting: Radiation Oncology

## 2011-07-12 ENCOUNTER — Other Ambulatory Visit: Payer: Self-pay | Admitting: Lab

## 2011-07-12 ENCOUNTER — Ambulatory Visit
Admission: RE | Admit: 2011-07-12 | Discharge: 2011-07-12 | Disposition: A | Discharge status: Home / Self Care | Payer: Self-pay | Source: Ambulatory Visit | Attending: Radiation Oncology | Admitting: Radiation Oncology

## 2011-07-12 DIAGNOSIS — C50919 Malignant neoplasm of unspecified site of unspecified female breast: Secondary | ICD-10-CM | POA: Insufficient documentation

## 2011-07-12 DIAGNOSIS — Z510 Encounter for antineoplastic radiation therapy: Secondary | ICD-10-CM | POA: Insufficient documentation

## 2011-07-12 NOTE — Progress Notes (Signed)
Pt arrived alert oriented x3, right chest wall area dry,flaky,slight erythema, has already signed up for [facility] for this April, discussed using vitamin e lotion in a couple weeks, pt in great spirits, last pain med took this am, no c/o pain right now 1:32 PM

## 2011-07-12 NOTE — Progress Notes (Signed)
Weekly Management Note Current Dose: 60.4  Gy  Projected Dose: 60.4 Gy   Narrative:  The patient presents for routine under treatment assessment.  CBCT/MVCT images/Port film x-rays were reviewed.  The chart was checked. Finishes treatment today. Feeling really positive about being done and continuing herceptin and tam.  Seeing Elige Radon today and f/u with PCP re" anti-depressant. Minimal skin redness/irritaiton.   Physical Findings: Weight: 121 lb 8 oz (55.112 kg). Most of chest wall is healing/brown. Skin around scar still pink.   Impression:  The patient is tolerating radiation.  Plan:  Finishes RT today. Signed up for [facility]. F/u in 1 mo. Discussed lotion with vit e.

## 2011-07-12 NOTE — Progress Notes (Signed)
Individual Counseling Note Session Length  Data  Pt presents to session #3 dressed casually and to season/circumstance. No apparent change in pt ht/wt since last session. Pt alert and oriented to person/place/situation/time, appearance/hygeine within normal limits (WNL), normal eye contact, speech normal rate/volume. Pt is calm, mood euthymic, affect normal range. Pt thought process is coherent, thought content WNL. Pt denies audio-visual hallucinations; denies suicidal/homicidal ideation/intent/plan. No sensory impairments noted. Changes since last session: Pt reports ongoing stability of mood, occ anxiety and tiredness d/t chemo tx.  Assessment  Case Conceptualization: Pt appears to be in preparation stage of bx change AEB engaging in creating a plan of action and stated readiness to face fear/anxiety and make changes in life to facilitate recovery. Pt also appears to engage in negative view of self (re: not being good enough at things in life); these negative thoughts appear to underlie fear/anxiety. Interventions: Checked in w/ pt to assess progress toward goals. Engaged pt in session via active listening. Facilitated SLOT (Strengths, Limitations, Opportunities, Threats) activity w/ pt to brainstorm what she has and what she needs to aid in physical recovery and in optimizing mental health. Assisted pt to ID most pressing threat at current so that counseling can address it now. Response: Pt reports moderate progress toward goals (see below). Pt engaged w/ coun in session and in SLOT activity (see attached). Pt identified most salient threat to goals (re: school, work, health/balance) is her ongoing fear and anxiety, reported that she wants to work on these in [employer] along w/ developing wellness plan to facilitate ongoing wellness and recovery.  Plan  Progress Toward Goals: Pt reports mood remains stable. Plan: Next session to continue developing wellness plan to maximize opportunities and  minimize/plan for threats ID'd in SLOT; will also work w/ pt on negative automatic thoughts and teach pt to ID and counter them.  Crystal Campo B MS, LPCA, NCC

## 2011-07-13 ENCOUNTER — Other Ambulatory Visit: Payer: Self-pay

## 2011-07-13 ENCOUNTER — Encounter: Payer: Self-pay | Admitting: Physician Assistant

## 2011-07-13 ENCOUNTER — Ambulatory Visit (HOSPITAL_BASED_OUTPATIENT_CLINIC_OR_DEPARTMENT_OTHER): Payer: Self-pay | Admitting: Physician Assistant

## 2011-07-13 ENCOUNTER — Encounter (HOSPITAL_COMMUNITY): Payer: Self-pay | Admitting: *Deleted

## 2011-07-13 ENCOUNTER — Inpatient Hospital Stay (HOSPITAL_COMMUNITY)
Admission: EM | Admit: 2011-07-13 | Discharge: 2011-07-15 | DRG: 101 | Disposition: A | Discharge status: Home / Self Care | Payer: Self-pay | Attending: Internal Medicine | Admitting: Internal Medicine

## 2011-07-13 ENCOUNTER — Other Ambulatory Visit: Payer: Self-pay | Admitting: *Deleted

## 2011-07-13 ENCOUNTER — Emergency Department (HOSPITAL_COMMUNITY): Payer: Self-pay

## 2011-07-13 ENCOUNTER — Ambulatory Visit (HOSPITAL_BASED_OUTPATIENT_CLINIC_OR_DEPARTMENT_OTHER): Payer: Self-pay

## 2011-07-13 ENCOUNTER — Other Ambulatory Visit (HOSPITAL_BASED_OUTPATIENT_CLINIC_OR_DEPARTMENT_OTHER): Payer: Self-pay | Admitting: Lab

## 2011-07-13 ENCOUNTER — Ambulatory Visit (HOSPITAL_COMMUNITY)
Admission: RE | Admit: 2011-07-13 | Discharge: 2011-07-13 | Disposition: A | Discharge status: Home / Self Care | Payer: Self-pay | Source: Ambulatory Visit | Attending: Physician Assistant | Admitting: Physician Assistant

## 2011-07-13 VITALS — BP 112/76 | HR 137 | Temp 97.9°F | Ht 64.0 in | Wt 120.9 lb

## 2011-07-13 DIAGNOSIS — F32A Depression, unspecified: Secondary | ICD-10-CM | POA: Diagnosis not present

## 2011-07-13 DIAGNOSIS — Z170 Estrogen receptor positive status [ER+]: Secondary | ICD-10-CM

## 2011-07-13 DIAGNOSIS — C50919 Malignant neoplasm of unspecified site of unspecified female breast: Secondary | ICD-10-CM | POA: Diagnosis present

## 2011-07-13 DIAGNOSIS — X58XXXA Exposure to other specified factors, initial encounter: Secondary | ICD-10-CM | POA: Diagnosis present

## 2011-07-13 DIAGNOSIS — I498 Other specified cardiac arrhythmias: Secondary | ICD-10-CM | POA: Diagnosis present

## 2011-07-13 DIAGNOSIS — Z5112 Encounter for antineoplastic immunotherapy: Secondary | ICD-10-CM

## 2011-07-13 DIAGNOSIS — Z9221 Personal history of antineoplastic chemotherapy: Secondary | ICD-10-CM

## 2011-07-13 DIAGNOSIS — R569 Unspecified convulsions: Secondary | ICD-10-CM | POA: Diagnosis present

## 2011-07-13 DIAGNOSIS — F329 Major depressive disorder, single episode, unspecified: Secondary | ICD-10-CM | POA: Diagnosis not present

## 2011-07-13 DIAGNOSIS — G43909 Migraine, unspecified, not intractable, without status migrainosus: Secondary | ICD-10-CM | POA: Diagnosis not present

## 2011-07-13 DIAGNOSIS — Z9010 Acquired absence of unspecified breast and nipple: Secondary | ICD-10-CM

## 2011-07-13 DIAGNOSIS — C50419 Malignant neoplasm of upper-outer quadrant of unspecified female breast: Secondary | ICD-10-CM

## 2011-07-13 DIAGNOSIS — G40802 Other epilepsy, not intractable, without status epilepticus: Principal | ICD-10-CM | POA: Diagnosis present

## 2011-07-13 DIAGNOSIS — F411 Generalized anxiety disorder: Secondary | ICD-10-CM | POA: Diagnosis present

## 2011-07-13 DIAGNOSIS — S0083XA Contusion of other part of head, initial encounter: Secondary | ICD-10-CM | POA: Diagnosis present

## 2011-07-13 DIAGNOSIS — F419 Anxiety disorder, unspecified: Secondary | ICD-10-CM | POA: Diagnosis not present

## 2011-07-13 DIAGNOSIS — Z87891 Personal history of nicotine dependence: Secondary | ICD-10-CM

## 2011-07-13 DIAGNOSIS — R220 Localized swelling, mass and lump, head: Secondary | ICD-10-CM

## 2011-07-13 DIAGNOSIS — F3289 Other specified depressive episodes: Secondary | ICD-10-CM | POA: Diagnosis present

## 2011-07-13 DIAGNOSIS — S0003XA Contusion of scalp, initial encounter: Secondary | ICD-10-CM | POA: Diagnosis present

## 2011-07-13 DIAGNOSIS — G40909 Epilepsy, unspecified, not intractable, without status epilepticus: Secondary | ICD-10-CM

## 2011-07-13 LAB — DIFFERENTIAL
Basophils Absolute: 0 10*3/uL (ref 0.0–0.1)
Eosinophils Absolute: 0.4 10*3/uL (ref 0.0–0.7)
Eosinophils Relative: 5 % (ref 0–5)
Lymphocytes Relative: 22 % (ref 12–46)
Neutrophils Relative %: 61 % (ref 43–77)

## 2011-07-13 LAB — URINALYSIS, ROUTINE W REFLEX MICROSCOPIC
Bilirubin Urine: NEGATIVE
Glucose, UA: NEGATIVE mg/dL
Hgb urine dipstick: NEGATIVE
Nitrite: NEGATIVE
Specific Gravity, Urine: 1.031 — ABNORMAL HIGH (ref 1.005–1.030)
pH: 5.5 (ref 5.0–8.0)

## 2011-07-13 LAB — CBC WITH DIFFERENTIAL/PLATELET
BASO%: 0.3 % (ref 0.0–2.0)
Eosinophils Absolute: 0.5 10*3/uL (ref 0.0–0.5)
LYMPH%: 27.9 % (ref 14.0–49.7)
MCHC: 34.8 g/dL (ref 31.5–36.0)
MONO#: 0.4 10*3/uL (ref 0.1–0.9)
NEUT#: 2.1 10*3/uL (ref 1.5–6.5)
Platelets: 197 10*3/uL (ref 145–400)
RBC: 4.35 10*6/uL (ref 3.70–5.45)
RDW: 12.7 % (ref 11.2–14.5)
WBC: 4.2 10*3/uL (ref 3.9–10.3)
lymph#: 1.2 10*3/uL (ref 0.9–3.3)

## 2011-07-13 LAB — PREGNANCY, URINE: Preg Test, Ur: NEGATIVE

## 2011-07-13 LAB — COMPREHENSIVE METABOLIC PANEL
ALT: 86 U/L — ABNORMAL HIGH (ref 0–35)
ALT: 82 U/L — ABNORMAL HIGH (ref 0–35)
AST: 33 U/L (ref 0–37)
Albumin: 4.7 g/dL (ref 3.5–5.2)
Albumin: 4.3 g/dL (ref 3.5–5.2)
CO2: 23 mEq/L (ref 19–32)
Calcium: 9.9 mg/dL (ref 8.4–10.5)
Potassium: 3.5 mEq/L (ref 3.5–5.3)
Sodium: 139 mEq/L (ref 135–145)
Sodium: 139 mEq/L (ref 135–145)
Total Bilirubin: 0.6 mg/dL (ref 0.3–1.2)
Total Protein: 6.6 g/dL (ref 6.0–8.3)
Total Protein: 7.1 g/dL (ref 6.0–8.3)

## 2011-07-13 LAB — CBC
MCH: 32.3 pg (ref 26.0–34.0)
MCV: 91.7 fL (ref 78.0–100.0)
Platelets: 247 10*3/uL (ref 150–400)
RDW: 12.5 % (ref 11.5–15.5)
WBC: 7 10*3/uL (ref 4.0–10.5)

## 2011-07-13 LAB — MAGNESIUM: Magnesium: 1.9 mg/dL (ref 1.5–2.5)

## 2011-07-13 MED ORDER — MEPERIDINE HCL 50 MG/ML IJ SOLN
12.5000 mg | Freq: Once | INTRAMUSCULAR | Status: AC
  Administered 2011-07-13: 12.5 mg via INTRAVENOUS

## 2011-07-13 MED ORDER — ACETAMINOPHEN 325 MG PO TABS
650.0000 mg | ORAL_TABLET | Freq: Once | ORAL | Status: AC
  Administered 2011-07-13: 650 mg via ORAL

## 2011-07-13 MED ORDER — ONDANSETRON 8 MG/50ML IVPB (CHCC)
8.0000 mg | Freq: Once | INTRAVENOUS | Status: AC
  Administered 2011-07-13: 8 mg via INTRAVENOUS

## 2011-07-13 MED ORDER — SODIUM CHLORIDE 0.9 % IJ SOLN
10.0000 mL | INTRAMUSCULAR | Status: DC | PRN
  Administered 2011-07-13: 10 mL
  Filled 2011-07-13: qty 10

## 2011-07-13 MED ORDER — TAMOXIFEN CITRATE 20 MG PO TABS: 20.0000 mg | ORAL_TABLET | Freq: Every day | ORAL | Status: AC

## 2011-07-13 MED ORDER — LORAZEPAM 2 MG/ML IJ SOLN
INTRAMUSCULAR | Status: AC
  Administered 2011-07-13: 2 mg
  Filled 2011-07-13: qty 1

## 2011-07-13 MED ORDER — DIPHENHYDRAMINE HCL 25 MG PO CAPS
25.0000 mg | ORAL_CAPSULE | Freq: Once | ORAL | Status: AC
  Administered 2011-07-13: 25 mg via ORAL

## 2011-07-13 MED ORDER — SODIUM CHLORIDE 0.9 % IV BOLUS (SEPSIS): 500.0000 mL | Freq: Once | INTRAVENOUS | Status: DC

## 2011-07-13 MED ORDER — IOHEXOL 300 MG/ML  SOLN
100.0000 mL | Freq: Once | INTRAMUSCULAR | Status: AC | PRN
  Administered 2011-07-13: 100 mL via INTRAVENOUS

## 2011-07-13 MED ORDER — TRASTUZUMAB CHEMO INJECTION 440 MG
336.0000 mg | Freq: Once | INTRAVENOUS | Status: AC
  Administered 2011-07-13: 336 mg via INTRAVENOUS
  Filled 2011-07-13: qty 16

## 2011-07-13 MED ORDER — HEPARIN SOD (PORK) LOCK FLUSH 100 UNIT/ML IV SOLN
500.0000 [IU] | Freq: Once | INTRAVENOUS | Status: AC | PRN
  Administered 2011-07-13: 500 [IU]
  Filled 2011-07-13: qty 5

## 2011-07-13 MED ORDER — MORPHINE SULFATE 4 MG/ML IJ SOLN
INTRAMUSCULAR | Status: AC
  Administered 2011-07-13: 4 mg
  Filled 2011-07-13: qty 1

## 2011-07-13 MED ORDER — SODIUM CHLORIDE 0.9 % IV SOLN
Freq: Once | INTRAVENOUS | Status: AC
  Administered 2011-07-13: 15:00:00 via INTRAVENOUS

## 2011-07-13 MED ORDER — SODIUM CHLORIDE 0.9 % IV SOLN
INTRAVENOUS | Status: DC
  Administered 2011-07-13: 20:00:00 via INTRAVENOUS

## 2011-07-13 MED ORDER — MEPERIDINE HCL 50 MG/ML IJ SOLN
12.5000 mg | INTRAMUSCULAR | Status: DC | PRN
  Filled 2011-07-13: qty 1

## 2011-07-13 NOTE — Consult Note (Signed)
Neurology Consult Note  Referring Physician: Clarene Duke Primary Care Physician: [name], MD, MD  Chief Complaint: Seizure  HPI: [name] is a 36 y.o.  female who presents for evaluation of several seizures this evening.  The patient has a history of episodes which last occurred in 2009.  At that time she was on several AEDs and had weekly refractory seizures.  However, she had an EMU stay at [facility] for one week in which the removed all AEDs and performed 72 hours of sleep deprivation, yet no seizures or EEG abnormalities occurred.  Since that time she has only had 2 more spells until today.  She possibly had a seizure this morning at home when she was left alone.  Her boyfriend called her and she was confused and mentioned she had a knot on her head.  She had told her chemo physician this so they recommended getting an MRI today.  Prior to getting the MRI she had 3 more seizures that lasted about 45 seconds of shaking folllowed by 3 minutes of unresponsiveness.  She is not sure what may have caused this recent flurry of spells, but she does mention being stressed more by the chemo and her poor health.    Current facility-administered medications:0.9 %  sodium chloride infusion, , Intravenous, Continuous, Nathan R. Pickering, MD, Last Rate: 125 mL/hr at 07/13/11 1930;  iohexol (OMNIPAQUE) 300 MG/ML solution 100 mL, 100 mL, Intravenous, Once PRN, Juliet Rude. Pickering, MD, 100 mL at 07/13/11 1923;  LORazepam (ATIVAN) 2 MG/ML injection, , , , , 2 mg at 07/13/11 1839;  LORazepam (ATIVAN) 2 MG/ML injection, , , , , 2 mg at 07/13/11 1929 meperidine (DEMEROL) injection 12.5 mg, 12.5 mg, Intravenous, Q4H PRN, Nishant Dhungel, MD;  meperidine (DEMEROL) injection 12.5 mg, 12.5 mg, Intravenous, Once, Nishant Dhungel, MD, 12.5 mg at 07/13/11 2209;  morphine 4 MG/ML injection, , , , , 4 mg at 07/13/11 2058;  sodium chloride 0.9 % bolus 500 mL, 500 mL, Intravenous, Once, [employer]. Rubin Payor,  MD Current outpatient prescriptions:ALPRAZolam Prudy Feeler) 0.5 MG tablet, Take 0.5 mg by mouth 3 (three) times daily as needed. anxiety, Disp: , Rfl: ;  cholecalciferol (VITAMIN D) 1000 UNITS tablet, Take 1,000 Units by mouth daily., Disp: , Rfl: ;  citalopram (CELEXA) 20 MG tablet, Take 1 tablet (20 mg total) by mouth daily., Disp: 30 tablet, Rfl: 1;  emollient (BIAFINE) cream, Apply topically as needed., Disp: , Rfl:  lidocaine-prilocaine (EMLA) cream, Apply topically as needed. Apply to port as needed, Disp: , Rfl: ;  Multiple Vitamins-Minerals (MULTIVITAMIN WITH MINERALS) tablet, Take 1 tablet by mouth daily., Disp: , Rfl: ;  promethazine (PHENERGAN) 25 MG tablet, Take 25 mg by mouth every 6 (six) hours as needed. nausea, Disp: , Rfl: ;  ranitidine (ZANTAC) 150 MG capsule, Take 150 mg by mouth 2 (two) times daily as needed. For heartburn, Disp: , Rfl:  tamoxifen (NOLVADEX) 20 MG tablet, Take 1 tablet (20 mg total) by mouth daily., Disp: 30 tablet, Rfl: 6;  polyethylene glycol (MIRALAX / GLYCOLAX) packet, Take 17 g by mouth daily. constipation , Disp: , Rfl:  Facility-Administered Medications Ordered in Other Encounters: 0.9 %  sodium chloride infusion, , Intravenous, Once, Amy Allegra Grana, PA;  acetaminophen (TYLENOL) tablet 650 mg, 650 mg, Oral, Once, Amy G Berry, PA, 650 mg at 07/13/11 1506;  diphenhydrAMINE (BENADRYL) capsule 25 mg, 25 mg, Oral, Once, Amy G Berry, PA, 25 mg at 07/13/11 1506;  heparin lock flush 100  unit/mL, 500 Units, Intracatheter, Once PRN, Catalina Gravel, PA, 500 Units at 07/13/11 1601 ondansetron (ZOFRAN) IVPB 8 mg, 8 mg, Intravenous, Once, Amy G Berry, PA, 8 mg at 07/13/11 1508;  trastuzumab (HERCEPTIN) 336 mg in sodium chloride 0.9 % 250 mL chemo infusion, 336 mg, Intravenous, Once, Catalina Gravel, PA, 336 mg at 07/13/11 1522;  DISCONTD: sodium chloride 0.9 % injection 10 mL, 10 mL, Intracatheter, PRN, Catalina Gravel, PA, 10 mL at 07/13/11 1601  Past Medical History  Diagnosis Date  . Allergy    . Right breast ca dx'd 10/2010  . Breast cancer 12/03/10  . Status post chemotherapy     docetaxel/carboplatin/trastuzumab  . Anxiety   . Seizures     last seizure 2 years ago, diagnosed 3 years ago  . Hx MRSA infection 03/28/11    Skin     Past Surgical History  Procedure Date  . Wisdom tooth extraction   . Breast surgery 11/16/2010    bilateral mastectomy+Ralnd,T1cN1a, Her2+,ERPR+  . Biopsy breast 10/14/2010    right needle core biopsy  . Portacath placement 11/16/2010    placement of left subclavian port    Allergies  Allergen Reactions  . Phenytoin Nausea And Vomiting  . XLK:GMWNUUVOZDG+UYQIHKVQQ+VZDGLOVFIE Acid+Aspartame Other (See Comments)    faint  . Thorazine (Chlorpromazine Hcl) Hives  . Augmentin Other (See Comments)    Syncope  . Ciprofloxacin Nausea And Vomiting  . Vancomycin Rash    Possible rash reported by MD    Family History  Problem Relation Age of Onset  . Cancer Maternal Grandfather     History  Substance Use Topics  . Smoking status: Former Smoker -- 2.0 packs/day for 20 years    Types: Cigarettes    Quit date: 11/01/2010  . Smokeless tobacco: Never Used  . Alcohol Use: No     quit drinking 6 months      Review of Systems A complete review of systems was performed and was negative.  Physical Exam: BP 136/92  Pulse 90  Temp(Src) 97.6 F (36.4 C) (Oral)  Resp 12  SpO2 100%  GENERAL:     Well nourished, well hydrated, no acute distress.   CARDIOVASCULAR:   - Regular rate and rhythm, no thrills or palpable murmurs, S1, S2, no murmur, no rubs or gallops.  - Carotid arteries: No carotid bruits.   MENTAL STATUS EXAM:    - Orientation: Alert and oriented to person, place and time.  - Memory: Cooperative, follows commands well. Recent and remote memory normal.  - Attention, concentration: Attention span and concentration are normal.  - Language: Speech is clear and language is normal.  - Fund of knowledge: Aware of current events,  vocabulary appropriate for patient age.   CRANIAL NERVES:    - CN 2 (Optic): Visual fields intact to confrontation, funduscopic examination without optic disk pallor or edema, retinal vessels are normal.  - CN 3,4,6 (EOM): Pupils equal and reactive to light and near full eye movement without nystagmus.  - CN 5 (Trigeminal): Facial sensation is normal, no weakness of masticatory muscles.  - CN 7 (Facial): No facial weakness or asymmetry.  - CN 8 (Auditory): Auditory acuity grossly normal.  - CN 9,10 (Glossophar): The uvula is midline, the palate elevates symmetrically.  - CN 11 (spinal access): Normal sternocleidomastoid and trapezius strength.  -CN 12 (Hypoglossal): The tongue is midline. No atrophy or fasciculations.   MOTOR:  Full strength in muscles in proximal and distal muscle groups in  both upper and lower extremities.   Muscle Tone: Tone and muscle bulk are normal in the upper and lower extremities.   REFLEXES:   - Biceps:                 (R): 3+  (L):3+  - Brachioradialis:    (R): 3+  (L): 3+  - Patellar:                (R): 3+   (L):3+  - Achilles:                (R): 3+   (L):3+  - Babinski:   (R): absent  (L): absent  COORDINATION:  finger-to-nose intact, heel-to-shin intact, and rapid alternating movements intact, no tremor.   SENSATION:   Intact to light touch, vibration, pinprick. Negative Romberg test..   GAIT: Routine and tandem gait are normal.  Diagnostic Studies: CT Head with and without- no acute intracranial abnormality.    Impression: 36 y/o female with a history of episodes that are not clearly epileptic presenting again with 4 more episodes that are concerning for seizures.  She has risk factors for seizures with a known cancer with risk of metastasis and multiple medications.  However, strong consideration must be given to non-epileptic events as she has been off of all AEDs for four years without any episodes and these were precipitated by  stress.  Recommendations: Recommend MRI brain with and without in the morning to look for signs of metastasis EEG in the morning.   It has been a pleasure to participate in the care of this patient.  Best Regards, Lajuana Carry, MD

## 2011-07-13 NOTE — ED Provider Notes (Signed)
History     CSN: [id number]  Arrival date & time 07/13/11  1725   First MD Initiated Contact with Patient 07/13/11 1742      Chief Complaint  Patient presents with  . Seizures    (Consider location/radiation/quality/duration/timing/severity/associated sxs/prior treatment) Patient is a 36 y.o. female presenting with seizures. The history is provided by the patient.  Seizures  This is a recurrent problem. Pertinent negatives include no headaches, no chest pain, no nausea, no vomiting and no diarrhea.   patient has a history of seizures. She had frequent seizures previously, but has not had any couple years. She is also on treatment for breast cancer. She's not had a metastatic disease to the brain. She's had one seizure while waiting for an MRI today. Likely had one earlier today to. She has a hematoma to the back of her head that she does not know where he came from. The husband thinks it may be from a previous seizure. During my examination patient had another brief tonic-clonic seizure. She was scheduled for an MRI of the brain with and without contrast.  Past Medical History  Diagnosis Date  . Allergy   . Right breast ca dx'd 10/2010  . Breast cancer 12/03/10  . Status post chemotherapy     docetaxel/carboplatin/trastuzumab  . Anxiety   . Seizures     last seizure 2 years ago, diagnosed 3 years ago  . Hx MRSA infection 03/28/11    Skin    Past Surgical History  Procedure Date  . Wisdom tooth extraction   . Breast surgery 11/16/2010    bilateral mastectomy+Ralnd,T1cN1a, Her2+,ERPR+  . Biopsy breast 10/14/2010    right needle core biopsy  . Portacath placement 11/16/2010    placement of left subclavian port    Family History  Problem Relation Age of Onset  . Cancer Maternal Grandfather     History  Substance Use Topics  . Smoking status: Former Smoker -- 2.0 packs/day for 20 years    Types: Cigarettes    Quit date: 11/01/2010  . Smokeless tobacco: Never Used  .  Alcohol Use: No     quit drinking 6 months    OB History    Grav Para Term Preterm Abortions TAB SAB Ect Mult Living   1 1 1  0 0 0 0 0 0 0      Review of Systems  Constitutional: Negative for activity change and appetite change.  HENT: Negative for neck stiffness.   Eyes: Negative for pain.  Respiratory: Negative for chest tightness and shortness of breath.   Cardiovascular: Negative for chest pain and leg swelling.  Gastrointestinal: Negative for nausea, vomiting, abdominal pain and diarrhea.  Genitourinary: Negative for flank pain.  Musculoskeletal: Negative for back pain.  Skin: Negative for rash.  Neurological: Positive for seizures. Negative for weakness, numbness and headaches.  Psychiatric/Behavioral: Negative for behavioral problems.    Allergies  Phenytoin; WGN:FAOZHYQMVHQ+IONGEXBMW+UXLKGMWNUU acid+aspartame; Thorazine; Augmentin; Ciprofloxacin; and Vancomycin  Home Medications   Current Outpatient Rx  Name Route Sig Dispense Refill  . ALPRAZOLAM 0.5 MG PO TABS Oral Take 0.5 mg by mouth 3 (three) times daily as needed. anxiety    . VITAMIN D 1000 UNITS PO TABS Oral Take 1,000 Units by mouth daily.    Marland Kitchen CITALOPRAM HYDROBROMIDE 20 MG PO TABS Oral Take 1 tablet (20 mg total) by mouth daily. 30 tablet 1  . EMOLLIENT BASE EX CREA Topical Apply topically as needed.    Marland Kitchen LIDOCAINE-PRILOCAINE 2.5-2.5 % EX  CREA Topical Apply topically as needed. Apply to port as needed    . MULTI-VITAMIN/MINERALS PO TABS Oral Take 1 tablet by mouth daily.    Marland Kitchen PROMETHAZINE HCL 25 MG PO TABS Oral Take 25 mg by mouth every 6 (six) hours as needed. nausea    . RANITIDINE HCL 150 MG PO CAPS Oral Take 150 mg by mouth 2 (two) times daily as needed. For heartburn    . TAMOXIFEN CITRATE 20 MG PO TABS Oral Take 1 tablet (20 mg total) by mouth daily. 30 tablet 6  . POLYETHYLENE GLYCOL 3350 PO PACK Oral Take 17 g by mouth daily. constipation       BP 113/81  Pulse 88  Temp(Src) 97.9 F (36.6 C)  (Oral)  Resp 16  SpO2 100%  Physical Exam  Nursing note and vitals reviewed. Constitutional: She is oriented to person, place, and time. She appears well-developed and well-nourished.  HENT:  Head: Normocephalic.       3 cm hematoma in the right occipital area  Eyes: EOM are normal. Pupils are equal, round, and reactive to light.  Neck: Normal range of motion. Neck supple.  Cardiovascular: Normal rate, regular rhythm and normal heart sounds.   No murmur heard. Pulmonary/Chest: Effort normal and breath sounds normal. No respiratory distress. She has no wheezes. She has no rales.       Bilateral mastectomies. Port-A-Cath left chest wall  Abdominal: Soft. Bowel sounds are normal. She exhibits no distension. There is no tenderness. There is no rebound and no guarding.  Musculoskeletal: Normal range of motion.  Neurological: She is alert and oriented to person, place, and time. No cranial nerve deficit.  Skin: Skin is warm and dry.  Psychiatric: She has a normal mood and affect. Her speech is normal.    ED Course  Procedures (including critical care time)  Labs Reviewed  COMPREHENSIVE METABOLIC PANEL - Abnormal; Notable for the following:    ALT 82 (*)    GFR calc non Af Amer 71 (*)    GFR calc Af Amer 82 (*)    All other components within normal limits  URINALYSIS, ROUTINE W REFLEX MICROSCOPIC - Abnormal; Notable for the following:    Specific Gravity, Urine 1.031 (*)    All other components within normal limits  CBC  DIFFERENTIAL  PREGNANCY, URINE  MAGNESIUM   Ct Head W Wo Contrast  07/13/2011  *RADIOLOGY REPORT*  Clinical Data: Seizure  CT HEAD WITHOUT AND WITH CONTRAST  Technique:  Contiguous axial images were obtained from the base of the skull through the vertex without and with intravenous contrast.  Contrast: OMNIPAQUE IOHEXOL 300 MG/ML IJ SOLN  Comparison: 12/08/2010.  Findings: No mass effect, midline shift, acute intracranial hemorrhage, or abnormal enhancement.  No skull fracture.  Mastoid air cells and visualized paranasal sinuses are clear.  IMPRESSION: Negative.  Original Report Authenticated By: Donavan Burnet, M.D.   Dg Chest Port 1 View  07/13/2011  *RADIOLOGY REPORT*  Clinical Data: Seizures  PORTABLE CHEST - 1 VIEW  Comparison: 11/16/2010  Findings: Stable left subclavian Port-A-Cath.  Normal heart size. Clear lungs.  Postoperative changes in the right axilla.  No pneumothorax or pleural effusion.  No consolidation.  IMPRESSION: No active cardiopulmonary disease.  Original Report Authenticated By: Donavan Burnet, M.D.     1. Seizure       MDM  Patient with a recurrence of her seizures. She has previous history of multiple seizures, but has been seizure-free for  a while. 2 possible seizure early in the day vault hematoma of her head. While in the ER she had 2 more seizures. Patient and patient's husband states she is allergic to every seizure medicine that they tried. Head CT was done with and without contrast to look for trauma and metastatic disease. He did not show either. Patient will be admitted to medicine with a neurology consult        Juliet Rude. Rubin Payor, MD 07/14/11 0010

## 2011-07-13 NOTE — Progress Notes (Signed)
Following our appointment today, I was called to the infusion room to evaluate Crystal [name]. When she got to the infusion room and took her hat off, she realized she had a "lump" on the back of her head. She also has a slight headache, but denies any dizziness or change in vision. She denies any known trauma, and has had no falls. Recall that Crystal [name] does in fact have a history of seizure activity, although she has been more than 2 years without a seizure to the best of her knowledge.  On exam, there is a large edematous area on the right occipital region, raised, slightly fluctuant, and slightly erythematous. Upon close inspection, there are  petechiae, and the area looks as if it may have substained some type of trauma. I have ordered a brain MRI for further evaluation.  In the meanwhile have asked Crystal [name] to hold off on beginning her tamoxifen until we have the results back from the brain MRI. We will reassess at that time.  Zollie Scale, PA-C 07/13/2011

## 2011-07-13 NOTE — Progress Notes (Signed)
ID: Crystal [name]   DOB: 1975-12-26  MR#: [id number]  JXB#:[id number]  HISTORY OF PRESENT ILLNESS: The patient is a 36 year old [place] woman who noted a mass in her right breast and brought it to her primary care physician's attention June of 2012. She was set up for mammography at [facility], where an area of calcifications highly suspicious for carcinoma was biopsied. This was in the upper outer quadrant and it measured 1.7 cm mammographically and 1.8 cm by ultrasonography. The pathology report ([id number]) showed a high-grade invasive ductal carcinoma estrogen receptor positive at 64% progesterone receptor positive at 18%, and HER-2 amplified with a ratio by CISH of 5.24. MIB-1-1 was 65%. A second mass biopsied at the same time was also triple positive.  Given her multiple masses in the right breast, mastectomy was recommended. The patient actually opted for bilateral mastectomies and this was performed together with a right axillary lymph node dissection under Dr. Felicity Pellegrini on 11/16/2010. The final pathology [id number]) showed, on the left, no evidence of cancer. On the right the patient had a high-grade invasive ductal carcinoma measuring 1.9 cm, with negative margins, grade 3, involving 2 of 16 lymph nodes (stage IIB).  Crystal [name] was treated in the adjuvant setting, with 6 doses of docetaxel/carboplatin/trastuzumab, completed December 2012. She is status post radiation therapy.  She continues on trastuzumab every 3 weeks and began tamoxifen in March 2013.  INTERVAL HISTORY:  Crystal [name] returns today for followup of her right breast carcinoma. Interval history is remarkable for Crystal [name] having completed radiation therapy, her last treatment on March 12. She tolerated radiation well with only some mild skin changes. She is here today for followup, due for her next every 3 week dose of trastuzumab.  Crystal [name] is tolerating the trastuzumab well. She's been followed by Dr. Gala Romney and is scheduled to meet with  him for followup on March 25. She's had no chest pain, pressure, shortness of breath, or orthopnea. No peripheral swelling, other than mild lymphedema in the right upper extremity.  Crystal [name] is ready to begin on the tamoxifen. She's not been having menstrual cycles, but understands that she could get pregnant. She has occasional hot flashes. She has no history of abnormal clotting. There is a headaches or change in vision. She continues to be mildly fatigued. She also struggles with some depression, and has been meeting with one of our counselors.   A detailed review of systems is otherwise noncontributory as noted below.  Review of Systems: Constitutional:  Fatigue; no weight loss, fever, night sweats  Eyes: negative LKG:[id number] Cardiovascular: no chest pain or dyspnea on exertion Respiratory: no cough, shortness of breath, or wheezing Neurological: no TIA or stroke symptoms negative Dermatological: negative Gastrointestinal: no abdominal pain, nausea, change in bowel habits, or black or bloody stools Genito-Urinary: no dysuria, trouble voiding, or hematuria Hematological and Lymphatic: negative Breast: negative Musculoskeletal: swelling in right upper extremity Remaining ROS negative.   PAST MEDICAL HISTORY: Past Medical History  Diagnosis Date  . Allergy   . Right breast ca dx'd 10/2010  . Breast cancer 12/03/10  . Status post chemotherapy     docetaxel/carboplatin/trastuzumab  . Anxiety   . Seizures     last seizure 2 years ago, diagnosed 3 years ago  . Hx MRSA infection 03/28/11    Skin  1. History of seizures. The patient has been thoroughly evaluated both here by Dr. [name] and at [facility] for this. Among many studies, she had an MRI of the brain on August  28th. This showed an incidental solitary small lesion in the left frontoparietal white matter. It was not felt to be suggestive of multiple sclerosis. A noncontrasted CT in August 2010 was negative. The patient has  been off all seizure medications now for 2 months. There has been no evidence of seizure recurrence. 2. Complex psychology history (please refer to Dr. [name] note in the E-chart from August 29, 2009) with evidence of anxiety disorder, depression and psychosis. The patient is currently on no medications for this and denies any of these symptoms at present. 3. History of migraines. 4. Remote history of multidrug abuse. 5. Status post wisdom teeth extraction.  History of MRSA skin infection   PAST SURGICAL HISTORY: Past Surgical History  Procedure Date  . Wisdom tooth extraction   . Breast surgery 11/16/2010    bilateral mastectomy+Ralnd,T1cN1a, Her2+,ERPR+  . Biopsy breast 10/14/2010    right needle core biopsy  . Portacath placement 11/16/2010    placement of left subclavian port    FAMILY HISTORY Family History  Problem Relation Age of Onset  . Cancer Maternal Grandfather   The patient's mother is living, in her mid 46s, with no history of breast or ovarian cancer. The patient does not know her biological father. The patient has an older brother with bipolar disease, and according to the patient, borderline schizophrenia.   GYNECOLOGIC HISTORY: Menarche at age 20. She is G1, P1 with first pregnancy to term at age 23. The patient had significant pelvic pain recently and was evaluated with transvaginal and pelvic ultrasound on July 17th by Dr. Natale Milch. This showed normal uterine myometrium and endometrium with bilateral functional ovarian cysts. There were no worrisome features. The patient had a Pap smear which was unremarkable on July 26th.  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 Her husband of 4 years, [name], works as a Administrator. In addition to them at home is the patient's son, [name], 68 years old attends [employer] in the tenth grade. Crystal [name], according to the patient, has significant nausea problems as  indeed the patient did previously. The patient attends a local [address]. 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    ADVANCED DIRECTIVES:  HEALTH MAINTENANCE: History  Substance Use Topics  . Smoking status: Former Smoker -- 2.0 packs/day for 20 years    Types: Cigarettes    Quit date: 11/01/2010  . Smokeless tobacco: Never Used  . Alcohol Use: No     quit drinking 6 months     Colonoscopy:  PAP:  Bone density:  Lipid panel:  Allergies  Allergen Reactions  . Phenytoin Nausea And Vomiting  . ZOX:WRUEAVWUJWJ+XBJYNWGNF+AOZHYQMVHQ Acid+Aspartame Other (See Comments)    faint  . Thorazine (Chlorpromazine Hcl) Hives  . Augmentin Other (See Comments)    Syncope  . Ciprofloxacin Nausea And Vomiting  . Vancomycin Rash    Possible rash reported by MD    Current Outpatient Prescriptions  Medication Sig Dispense Refill  . ALPRAZolam (XANAX) 0.5 MG tablet Take 0.5 mg by mouth 3 (three) times daily as needed. anxiety      . citalopram (CELEXA) 20 MG tablet Take 1 tablet (20 mg total) by mouth daily.  30 tablet  1  . emollient (BIAFINE) cream Apply topically as needed.      Crystal [name] Kitchen HYDROcodone-acetaminophen (VICODIN) 5-500 MG per tablet Take 1 tablet by mouth every 6 (six) hours as needed.  60 tablet  0  .  lidocaine-prilocaine (EMLA) cream Apply topically as needed. Apply to port as needed      . polyethylene glycol (MIRALAX / GLYCOLAX) packet Take 17 g by mouth daily. constipation       . promethazine (PHENERGAN) 25 MG tablet Take 25 mg by mouth every 6 (six) hours as needed. nausea      . ranitidine (ZANTAC) 150 MG capsule Take 150 mg by mouth 2 (two) times daily as needed. For heartburn      . tamoxifen (NOLVADEX) 20 MG tablet Take 1 tablet (20 mg total) by mouth daily.  30 tablet  6   No current facility-administered medications for this visit.   Facility-Administered Medications Ordered in Other Visits  Medication Dose Route Frequency Provider Last Rate Last Dose  . 0.9 %  sodium chloride  infusion   Intravenous Once Catalina Gravel, PA 20 mL/hr at 07/13/11 1445    . acetaminophen (TYLENOL) tablet 650 mg  650 mg Oral Once Catalina Gravel, PA   650 mg at 07/13/11 1506  . diphenhydrAMINE (BENADRYL) capsule 25 mg  25 mg Oral Once Catalina Gravel, PA   25 mg at 07/13/11 1506  . heparin lock flush 100 unit/mL  500 Units Intracatheter Once PRN Catalina Gravel, PA   500 Units at 07/13/11 1601  . ondansetron (ZOFRAN) IVPB 8 mg  8 mg Intravenous Once Catalina Gravel, PA   8 mg at 07/13/11 1508  . sodium chloride 0.9 % injection 10 mL  10 mL Intracatheter PRN Catalina Gravel, PA   10 mL at 07/13/11 1601  . trastuzumab (HERCEPTIN) 336 mg in sodium chloride 0.9 % 250 mL chemo infusion  336 mg Intravenous Once Catalina Gravel, PA 532 mL/hr at 07/13/11 1522 336 mg at 07/13/11 1522    OBJECTIVE: Filed Vitals:   07/13/11 1348  BP: 112/76  Pulse: 137  Temp: 97.9 F (36.6 C)     Body mass index is 20.75 kg/(m^2).    ECOG FS: 1  Physical Exam: HEENT:  Sclerae anicteric, conjunctivae pink.  Oropharynx clear.  No mucositis or candidiasis.   Nodes:  No cervical, supraclavicular, or axillary lymphadenopathy palpated.  Breast Exam:  The patient status post bilateral mastectomies, well-healed incisions. No significant skin changes secondary to radiation.No nodularity, or evidence of local recurrence. Lungs:  Clear to auscultation bilaterally.  No crackles, rhonchi, or wheezes.   Heart:  Regular rate and rhythm.   Abdomen:  Soft, nontender.  Positive bowel sounds.  No organomegaly or masses palpated.   Musculoskeletal:  No focal spinal tenderness to palpation.  Extremities:  Benign.  No peripheral edema or cyanosis.   Skin:  Benign.   Neuro:  Nonfocal.    LAB RESULTS: Lab Results  Component Value Date   WBC 4.2 07/13/2011   NEUTROABS 2.1 07/13/2011   HGB 14.3 07/13/2011   HCT 41.0 07/13/2011   MCV 94.1 07/13/2011   PLT 197 07/13/2011      Chemistry      Component Value Date/Time   NA 142 06/13/2011 1247   K 4.1  06/13/2011 1247   CL 104 06/13/2011 1247   CO2 25 06/13/2011 1247   BUN 22 06/13/2011 1247   CREATININE 0.83 06/13/2011 1247      Component Value Date/Time   CALCIUM 10.1 06/13/2011 1247   ALKPHOS 76 06/13/2011 1247   AST 18 06/13/2011 1247   ALT 11 06/13/2011 1247   BILITOT 0.4 06/13/2011 1247  Lab Results  Component Value Date   LABCA2 18 06/09/2011     STUDIES: No new results found.  ASSESSMENT: 36 year old [place] woman, known to be BRCA1 and 2 negative,   (1) status post bilateral mastectomies May of 2012 for a right-sided T1c N1 (stage II) grade 3 invasive ductal carcinoma,estrogen and progesterone receptor positive, HER-2 amplified, with an MIB-1-1 of 85%.   (2) status post carboplatin/docetaxol/trastuzumab x6, completed mid December 2012   (3) continuing on trastuzumab every 3 weeks .   (4) status post radiation therapy, completed 07/12/2011  (5)  beginning on tamoxifen, 20 mg daily, and in March 2013.   PLAN:  Marilin and I again reviewed benefits as well as possible side effects associated with tamoxifen, and she is ready to begin on this medication. We've given her prescription today, 20 mg by mouth daily.  She will continue to receive trastuzumab every 3 weeks, and will receive her next dose today. She will followup with the cardiologist as noted above on March 25 to review her  repeat echocardiogram.   The patient will return in 3 weeks for trastuzumab alone, and will see [place] again in 6 weeks for followup, and assessment for tolerance of tamoxifen. She'll call meanwhile any changes or problems.  Haniya Fern    07/13/2011

## 2011-07-13 NOTE — H&P (Signed)
PCP:  [name], MD, MD   DOA:  07/13/2011  5:35 PM  Chief Complaint:  Generalized seizures  HPI: 36 y/o female with hx of Seizure disorder with multiple workup done by her outpt neurologist and at [facility] in past and previously on multiple AED which she discontinued 4 years back and been seizure free since then, hx of anxiety disorder with depression ( seen by psych at [facility] in past and also with diagnosis of tourette's syndrome per her previous neurologist);  invasive ductal cell breast ca  diagnosed in June 2012 ca s/p b/l mastectomy in jul 2012 ( follows with Dr Newt Minion)  completed adjuvant chemo  In dec 2012 and scheduled for starting tamoxifen today  and completed rtx yesterday ( 3/12) was sent from cancer center for MRI of there head as she noted a hematoma over rt posterior scalp while at cancer center for follow up. She informs that she was alone at home this moring  and does not remember passing out or having any seizure. While being sent to the MRI she had 4 episodes  of tonic clonic seizure each lasting approximately unde a minute and also had urinary incontinence. denies any tongue bite or bowel incontinence.  Patient denies having any aura for these seizure episodes. She was taken off all her AED 4 yrs back as she was having seizure despite being on it and remained seizure free for few years.   on my evaluation she complains of diffuse headache but denies any other symptoms. She denies any chest pain, palpitations, blurry vision, dizziness, N,V. Abdominal pain, muscle aches or weakness. Denies any dysuria or bowel symptoms.deneis use of etoh or illicit drugs.   Of note, patient has been admitted to [facility] in 07/2009 for etoh abuse and opiate abuse with visual and auditory hallucinations . She also carries a hx of mood disorder and anxiety with panic attacks and some depression. As per psych note from then there was also concern for PTSD and patient did give hx of using marijuana and  cocaine at that time.  Allergies: Allergies  Allergen Reactions  . Phenytoin Nausea And Vomiting  . ZOX:WRUEAVWUJWJ+XBJYNWGNF+AOZHYQMVHQ Acid+Aspartame Other (See Comments)    faint  . Thorazine (Chlorpromazine Hcl) Hives  . Augmentin Other (See Comments)    Syncope  . Ciprofloxacin Nausea And Vomiting  . Vancomycin Rash    Possible rash reported by MD    Prior to Admission medications   Medication Sig Start Date End Date Taking? Authorizing Provider  ALPRAZolam Prudy Feeler) 0.5 MG tablet Take 0.5 mg by mouth 3 (three) times daily as needed. anxiety   Yes Historical Provider, MD  cholecalciferol (VITAMIN D) 1000 UNITS tablet Take 1,000 Units by mouth daily.   Yes Historical Provider, MD  citalopram (CELEXA) 20 MG tablet Take 1 tablet (20 mg total) by mouth daily. 06/14/11 06/13/12 Yes [name], MD  emollient (BIAFINE) cream Apply topically as needed.   Yes Historical Provider, MD  lidocaine-prilocaine (EMLA) cream Apply topically as needed. Apply to port as needed 12/03/10  Yes Historical Provider, MD  Multiple Vitamins-Minerals (MULTIVITAMIN WITH MINERALS) tablet Take 1 tablet by mouth daily.   Yes Historical Provider, MD  promethazine (PHENERGAN) 25 MG tablet Take 25 mg by mouth every 6 (six) hours as needed. nausea   Yes Historical Provider, MD  ranitidine (ZANTAC) 150 MG capsule Take 150 mg by mouth 2 (two) times daily as needed. For heartburn   Yes Historical Provider, MD  tamoxifen (NOLVADEX) 20 MG tablet  Take 1 tablet (20 mg total) by mouth daily. 07/13/11 08/12/11 Yes Valentino Hue [name], MD  polyethylene glycol (MIRALAX / GLYCOLAX) packet Take 17 g by mouth daily. constipation     Historical Provider, MD    Past Medical History  Diagnosis Date  . Allergy   . Right breast ca dx'd 10/2010  . Breast cancer 12/03/10  . Status post chemotherapy     docetaxel/carboplatin/trastuzumab  . Anxiety   . Seizures     last seizure 2 years ago, diagnosed 3 years ago  . Hx MRSA infection  03/28/11    Skin    Past Surgical History  Procedure Date  . Wisdom tooth extraction   . Breast surgery 11/16/2010    bilateral mastectomy+Ralnd,T1cN1a, Her2+,ERPR+  . Biopsy breast 10/14/2010    right needle core biopsy  . Portacath placement 11/16/2010    placement of left subclavian port    Social History:  reports that she quit smoking about 8 months ago. Her smoking use included Cigarettes. She has a 40 pack-year smoking history. She has never used smokeless tobacco. She reports that she does not drink alcohol or use illicit drugs.  Family History  Problem Relation Age of Onset  . Cancer Maternal Grandfather     Review of Systems:  Constitutional: Denies fever, chills, diaphoresis, appetite change and fatigue.  HEENT: Denies photophobia, eye pain, redness, hearing loss, ear pain, congestion, sore throat, rhinorrhea, sneezing, mouth sores, trouble swallowing, neck pain, neck stiffness and tinnitus.   Respiratory: Denies SOB, DOE, cough, chest tightness,  and wheezing.   Cardiovascular: Denies chest pain, palpitations and leg swelling.  Gastrointestinal: Denies nausea, vomiting, abdominal pain, diarrhea, constipation, blood in stool and abdominal distention.  Genitourinary: Denies dysuria, urgency, frequency, hematuria, flank pain and difficulty urinating.  Musculoskeletal: Denies myalgias, back pain, joint swelling, arthralgias and gait problem.  Skin: Denies pallor, rash and wound.  Neurological: 4 episodes of tonic clonic seizures today with diffuse headaches, Denies dizziness, syncope, weakness, light-headedness, numbness Hematological: Denies adenopathy. Easy bruising, personal or family bleeding history  Psychiatric/Behavioral: Denies suicidal ideation, mood changes, confusion, nervousness, sleep disturbance and agitation   Physical Exam:  Filed Vitals:   07/13/11 1740 07/13/11 2032  BP: 123/79 136/92  Pulse: 118 90  Temp: 97.6 F (36.4 C)   TempSrc: Oral   Resp:  14 12  SpO2: 97% 100%    Constitutional: Vital signs reviewed.  Patient is a well-developed and well-nourished in no acute distress and cooperative with exam. Alert and oriented x3.  Head: Normocephalic and atraumatic Ear: TM normal bilaterally Mouth: no erythema or exudates, MMM Eyes: PERRL, EOMI, conjunctivae normal, No scleral icterus.  Neck: Supple, Trachea midline normal ROM, No JVD, mass, thyromegaly, or carotid bruit present.  Cardiovascular: RRR, S1 normal, S2 normal, no MRG, pulses symmetric and intact bilaterally Pulmonary/Chest: s/p bilateral mastectomy, left sided port A cath, CTAB, no wheezes, rales, or rhonchi Abdominal: Soft. Non-tender, non-distended, bowel sounds are normal, no masses, organomegaly, or guarding present.  GU: no CVA tenderness Musculoskeletal: No joint deformities, erythema, or stiffness, ROM full and no nontender Ext: no edema and no cyanosis, pulses palpable bilaterally (DP and PT) Hematology: no cervical, inginal, or axillary adenopathy.  Neurological: A&O x3, 3 x 3 cm right posterior scalp hematoma, Strenght is normal and symmetric bilaterally, cranial nerve II-XII are grossly intact, no focal motor deficit, sensory intact to light touch bilaterally.  Skin: Warm, dry and intact. No rash, cyanosis, or clubbing.  Psychiatric: Normal mood and affect.  speech and behavior is normal. Judgment and thought content normal. Cognition and memory are normal.   Labs on Admission:  Results for orders placed during the hospital encounter of 07/13/11 (from the past 48 hour(s))  CBC     Status: Normal   Collection Time   07/13/11  5:00 PM      Component Value Range Comment   WBC 7.0  4.0 - 10.5 (K/uL)    RBC 4.24  3.87 - 5.11 (MIL/uL)    Hemoglobin 13.7  12.0 - 15.0 (g/dL)    HCT 16.1  09.6 - 04.5 (%)    MCV 91.7  78.0 - 100.0 (fL)    MCH 32.3  26.0 - 34.0 (pg)    MCHC 35.2  30.0 - 36.0 (g/dL)    RDW 40.9  81.1 - 91.4 (%)    Platelets 247  150 - 400 (K/uL)     DIFFERENTIAL     Status: Normal   Collection Time   07/13/11  5:00 PM      Component Value Range Comment   Neutrophils Relative 61  43 - 77 (%)    Neutro Abs 4.3  1.7 - 7.7 (K/uL)    Lymphocytes Relative 22  12 - 46 (%)    Lymphs Abs 1.6  0.7 - 4.0 (K/uL)    Monocytes Relative 11  3 - 12 (%)    Monocytes Absolute 0.8  0.1 - 1.0 (K/uL)    Eosinophils Relative 5  0 - 5 (%)    Eosinophils Absolute 0.4  0.0 - 0.7 (K/uL)    Basophils Relative 0  0 - 1 (%)    Basophils Absolute 0.0  0.0 - 0.1 (K/uL)   COMPREHENSIVE METABOLIC PANEL     Status: Abnormal   Collection Time   07/13/11  5:00 PM      Component Value Range Comment   Sodium 139  135 - 145 (mEq/L)    Potassium 3.6  3.5 - 5.1 (mEq/L)    Chloride 99  96 - 112 (mEq/L)    CO2 20  19 - 32 (mEq/L)    Glucose, Bld 76  70 - 99 (mg/dL)    BUN 11  6 - 23 (mg/dL)    Creatinine, Ser 7.82  0.50 - 1.10 (mg/dL)    Calcium 9.9  8.4 - 10.5 (mg/dL)    Total Protein 7.1  6.0 - 8.3 (g/dL)    Albumin 4.3  3.5 - 5.2 (g/dL)    AST 33  0 - 37 (U/L)    ALT 82 (*) 0 - 35 (U/L)    Alkaline Phosphatase 107  39 - 117 (U/L)    Total Bilirubin 0.4  0.3 - 1.2 (mg/dL)    GFR calc non Af Amer 71 (*) >90 (mL/min)    GFR calc Af Amer 82 (*) >90 (mL/min)   URINALYSIS, ROUTINE W REFLEX MICROSCOPIC     Status: Abnormal   Collection Time   07/13/11  9:06 PM      Component Value Range Comment   Color, Urine YELLOW  YELLOW     APPearance CLEAR  CLEAR     Specific Gravity, Urine 1.031 (*) 1.005 - 1.030     pH 5.5  5.0 - 8.0     Glucose, UA NEGATIVE  NEGATIVE (mg/dL)    Hgb urine dipstick NEGATIVE  NEGATIVE     Bilirubin Urine NEGATIVE  NEGATIVE     Ketones, ur NEGATIVE  NEGATIVE (mg/dL)    Protein, ur  NEGATIVE  NEGATIVE (mg/dL)    Urobilinogen, UA 0.2  0.0 - 1.0 (mg/dL)    Nitrite NEGATIVE  NEGATIVE     Leukocytes, UA NEGATIVE  NEGATIVE  MICROSCOPIC NOT DONE ON URINES WITH NEGATIVE PROTEIN, BLOOD, LEUKOCYTES, NITRITE, OR GLUCOSE <1000 mg/dL.  PREGNANCY, URINE      Status: Normal   Collection Time   07/13/11  9:06 PM      Component Value Range Comment   Preg Test, Ur NEGATIVE  NEGATIVE      Radiological Exams on Admission: CT head unremarkable  Assessment/Plan 36 y/o female with hx of seizure dx in past , with multple w/up done by her neurologist and at [facility], off AED due to persistent seizure despite being on them and now seizure  free for almost 3-4 years, hx of anxiety and depression, mood disorder with psych admission in past for etoh and opiate abuse with hallucinations , breast ca s/p mastectomy , chemo and recently completed radiation was sent from cancer center for MRI had due to incidental finding of lump in her scalp and had 4 episodes of generalized convulsions.     *Generalized convulsive seizure Admit to medical floor  will cont prn ativan for seizure  Will hold off on further AED for now unless neurology recommends for it  will ger MRI brain to evaluate for any mets Ordered EEG Neurology to consult and will follow with recs Patient has had multiple seizure w/up done in past without definite  diagnosis and also has underlying hx of panic attacks and mood disorders which does concern if she had true seizures  given hx of etoh and substance abuse will check for serum etoh level and utox Check BMET and mag level     Breast cancer S/p mastectomy, adjuvant chemo and Rtx and started on tamoxifen today which i am holding for now until acute issue resolves.  please inform Dr Newt Minion about her admission   Migraine headaches  will order prn demerol and vicodin   Anxiety disorder Stable and will cont home meds   Depression Stable , cont home meds  Full code    DVT prophylaxis  SCD boots    Time Spent on Admission: 50 MINUTES  Ashley Montminy 07/13/2011, 10:14 PM

## 2011-07-13 NOTE — ED Notes (Signed)
Pt was sitting in waiting room for a MRI and had a seizure, per husband pt had a seizure today.

## 2011-07-14 ENCOUNTER — Telehealth: Payer: Self-pay | Admitting: Oncology

## 2011-07-14 ENCOUNTER — Inpatient Hospital Stay (HOSPITAL_COMMUNITY)
Admit: 2011-07-14 | Discharge: 2011-07-14 | Disposition: A | Discharge status: Home / Self Care | Payer: Self-pay | Attending: Internal Medicine | Admitting: Internal Medicine

## 2011-07-14 ENCOUNTER — Encounter (HOSPITAL_COMMUNITY): Payer: Self-pay | Admitting: General Practice

## 2011-07-14 ENCOUNTER — Inpatient Hospital Stay (HOSPITAL_COMMUNITY): Payer: Self-pay

## 2011-07-14 LAB — MRSA PCR SCREENING: MRSA by PCR: NEGATIVE

## 2011-07-14 LAB — RAPID URINE DRUG SCREEN, HOSP PERFORMED
Barbiturates: NOT DETECTED
Benzodiazepines: POSITIVE — AB

## 2011-07-14 MED ORDER — POLYETHYLENE GLYCOL 3350 17 G PO PACK
17.0000 g | PACK | Freq: Every day | ORAL | Status: DC
  Administered 2011-07-15: 17 g via ORAL
  Filled 2011-07-14 (×3): qty 1

## 2011-07-14 MED ORDER — ENSURE CLINICAL ST REVIGOR PO LIQD
237.0000 mL | Freq: Three times a day (TID) | ORAL | Status: DC
  Administered 2011-07-14 – 2011-07-15 (×2): 237 mL via ORAL

## 2011-07-14 MED ORDER — HYDROCODONE-ACETAMINOPHEN 5-325 MG PO TABS
1.0000 | ORAL_TABLET | ORAL | Status: DC | PRN
  Administered 2011-07-14 – 2011-07-15 (×4): 2 via ORAL
  Filled 2011-07-14 (×4): qty 2

## 2011-07-14 MED ORDER — MEPERIDINE HCL 50 MG/ML IJ SOLN
12.5000 mg | Freq: Once | INTRAMUSCULAR | Status: AC
  Administered 2011-07-14: 12.5 mg via INTRAVENOUS
  Filled 2011-07-14: qty 1

## 2011-07-14 MED ORDER — GADOBENATE DIMEGLUMINE 529 MG/ML IV SOLN
15.0000 mL | Freq: Once | INTRAVENOUS | Status: AC | PRN
  Administered 2011-07-14: 11 mL via INTRAVENOUS

## 2011-07-14 MED ORDER — MUPIROCIN CALCIUM 2 % EX CREA
TOPICAL_CREAM | Freq: Two times a day (BID) | CUTANEOUS | Status: DC
  Filled 2011-07-14: qty 15

## 2011-07-14 MED ORDER — SODIUM CHLORIDE 0.9 % IV SOLN
INTRAVENOUS | Status: DC
  Administered 2011-07-14 – 2011-07-15 (×2): via INTRAVENOUS

## 2011-07-14 MED ORDER — FAMOTIDINE 20 MG PO TABS
20.0000 mg | ORAL_TABLET | Freq: Two times a day (BID) | ORAL | Status: DC
  Administered 2011-07-14 – 2011-07-15 (×3): 20 mg via ORAL
  Filled 2011-07-14 (×7): qty 1

## 2011-07-14 MED ORDER — LORAZEPAM 2 MG/ML IJ SOLN: 2.0000 mg | INTRAMUSCULAR | Status: DC | PRN

## 2011-07-14 MED ORDER — CHLORHEXIDINE GLUCONATE CLOTH 2 % EX PADS: 6.0000 | MEDICATED_PAD | Freq: Every day | CUTANEOUS | Status: DC

## 2011-07-14 MED ORDER — VITAMIN D3 25 MCG (1000 UNIT) PO TABS
1000.0000 [IU] | ORAL_TABLET | Freq: Every day | ORAL | Status: DC
  Administered 2011-07-15: 1000 [IU] via ORAL
  Filled 2011-07-14 (×3): qty 1

## 2011-07-14 MED ORDER — ALPRAZOLAM 0.5 MG PO TABS
0.5000 mg | ORAL_TABLET | Freq: Three times a day (TID) | ORAL | Status: DC | PRN
  Administered 2011-07-14 – 2011-07-15 (×2): 0.5 mg via ORAL
  Filled 2011-07-14 (×2): qty 1

## 2011-07-14 MED ORDER — MULTI-VITAMIN/MINERALS PO TABS: 1.0000 | ORAL_TABLET | Freq: Every day | ORAL | Status: DC

## 2011-07-14 MED ORDER — MUPIROCIN 2 % EX OINT: 1.0000 "application " | TOPICAL_OINTMENT | Freq: Two times a day (BID) | CUTANEOUS | Status: DC

## 2011-07-14 MED ORDER — HYDROMORPHONE HCL PF 1 MG/ML IJ SOLN
1.0000 mg | INTRAMUSCULAR | Status: DC | PRN
  Administered 2011-07-14 – 2011-07-15 (×2): 1 mg via INTRAVENOUS
  Filled 2011-07-14 (×2): qty 1

## 2011-07-14 MED ORDER — CITALOPRAM HYDROBROMIDE 20 MG PO TABS
20.0000 mg | ORAL_TABLET | Freq: Every day | ORAL | Status: DC
  Administered 2011-07-15: 20 mg via ORAL
  Filled 2011-07-14 (×3): qty 1

## 2011-07-14 MED ORDER — PROMETHAZINE HCL 25 MG PO TABS: 25.0000 mg | ORAL_TABLET | Freq: Four times a day (QID) | ORAL | Status: DC | PRN

## 2011-07-14 MED ORDER — ADULT MULTIVITAMIN W/MINERALS CH
1.0000 | ORAL_TABLET | Freq: Every day | ORAL | Status: DC
  Administered 2011-07-15: 1 via ORAL
  Filled 2011-07-14 (×3): qty 1

## 2011-07-14 NOTE — ED Notes (Signed)
Pt. Husband given xanax to wife, states that she has been taking them for 8 years.

## 2011-07-14 NOTE — Telephone Encounter (Signed)
gve the pt her April 2013 appt calendar 

## 2011-07-14 NOTE — Progress Notes (Signed)
INITIAL ADULT NUTRITION ASSESSMENT Date: 07/14/2011   Time: 3:19 PM Reason for Assessment: Nutrition risk   ASSESSMENT: Female 36 y.o.  Dx: Generalized convulsive seizure  Hx:  Past Medical History  Diagnosis Date  . Allergy   . Right breast ca dx'd 10/2010  . Breast cancer 12/03/10  . Status post chemotherapy     docetaxel/carboplatin/trastuzumab  . Anxiety   . Seizures     last seizure 2 years ago, diagnosed 3 years ago  . Hx MRSA infection 03/28/11    Skin   Related Meds:  Scheduled Meds:   . Chlorhexidine Gluconate Cloth  6 each Topical Q0600  . cholecalciferol  1,000 Units Oral Daily  . citalopram  20 mg Oral Daily  . famotidine  20 mg Oral BID  . feeding supplement  237 mL Oral TID WC  . LORazepam      . LORazepam      . meperidine (DEMEROL) injection  12.5 mg Intravenous Once  . meperidine (DEMEROL) injection  12.5 mg Intravenous Once  . morphine      . mulitivitamin with minerals  1 tablet Oral Daily  . mupirocin ointment  1 application Nasal BID  . polyethylene glycol  17 g Oral Daily  . sodium chloride  500 mL Intravenous Once  . DISCONTD: multivitamin with minerals  1 tablet Oral Daily  . DISCONTD: mupirocin cream   Topical BID   Continuous Infusions:   . sodium chloride 100 mL/hr at 07/14/11 0246  . DISCONTD: sodium chloride 125 mL/hr at 07/13/11 1930   PRN Meds:.ALPRAZolam, gadobenate dimeglumine, HYDROcodone-acetaminophen, iohexol, LORazepam, meperidine (DEMEROL) injection, promethazine  Ht: 5\' 4"  (162.6 cm)  Wt: 122 lb 2.2 oz (55.4 kg)  Ideal Wt: 120 lb % Ideal Wt: 102  Usual Wt: 153 lb % Usual Wt: 79  Body mass index is 20.96 kg/(m^2).  Food/Nutrition Related Hx: Pt reports appetite has been down for the past year with 30 pound unintentional weight loss from August to December of 2012. Pt with breast cancer s/p mastectomy, chemotherapy, and radiation. Pt denies any taste changes. Pt reports she ate a good breakfast. Pt admitted with  generalized seizures, MRI today was negative for metastatic disease.   Labs:  CMP     Component Value Date/Time   NA 139 07/13/2011 1700   K 3.6 07/13/2011 1700   CL 99 07/13/2011 1700   CO2 20 07/13/2011 1700   GLUCOSE 76 07/13/2011 1700   BUN 11 07/13/2011 1700   CREATININE 1.03 07/13/2011 1700   CALCIUM 9.9 07/13/2011 1700   PROT 7.1 07/13/2011 1700   ALBUMIN 4.3 07/13/2011 1700   AST 33 07/13/2011 1700   ALT 82* 07/13/2011 1700   ALKPHOS 107 07/13/2011 1700   BILITOT 0.4 07/13/2011 1700   GFRNONAA 71* 07/13/2011 1700   GFRAA 82* 07/13/2011 1700   No intake or output data in the 24 hours ending 07/14/11 1527  Last BM - 07/13/11  Diet Order: General  IVF:    sodium chloride Last Rate: 100 mL/hr at 07/14/11 0246  DISCONTD: sodium chloride Last Rate: 125 mL/hr at 07/13/11 1930    Estimated Nutritional Needs:   Kcal:1700-1950 Protein:65-85g Fluid:1.7-1.9L  NUTRITION DIAGNOSIS: -Increased nutrient needs (NI-5.1).  Status: Ongoing -Pt meets criteria for severe PCM of chronic illness AEB 19.6% weight loss in the past 3 months and pt with <75% energy intake with decreased appetite for at least the past month   RELATED TO: weight loss PTA, breast CA on treatment (however  currently on hold while pt in hospital)  AS EVIDENCE BY: pt statement, H&P  MONITORING/EVALUATION(Goals): Pt to consume >90% of meals/supplements.  EDUCATION NEEDS: -No education needs identified at this time  INTERVENTION: Vanilla Ensure Clinical Strength TID. Encouraged continued excellent intake. Will monitor.   Dietitian #: [phone]  DOCUMENTATION CODES Per approved criteria  -Severe malnutrition in the context of chronic illness    Marshall Cork 07/14/2011, 3:19 PM

## 2011-07-14 NOTE — Progress Notes (Signed)
EEG completed, portable.

## 2011-07-14 NOTE — Progress Notes (Signed)
Subjective: No further seizures overnight.  Objective: Vital signs in last 24 hours: Temp:  [97.6 F (36.4 C)-98.6 F (37 C)] 98.6 F (37 C) (03/14 0202) Pulse Rate:  [82-137] 87  (03/14 0202) Resp:  [7-16] 14  (03/14 0202) BP: (102-136)/(72-92) 102/72 mmHg (03/14 0202) SpO2:  [97 %-100 %] 98 % (03/14 0202) Weight:  [54.84 kg (120 lb 14.4 oz)-55.4 kg (122 lb 2.2 oz)] 55.4 kg (122 lb 2.2 oz) (03/14 0202) Weight change:  Last BM Date: 07/13/11  Intake/Output from previous day:       Physical Exam: General: Comfortable, alert, communicative, fully oriented, not short of breath at rest.  HEENT:  No clinical pallor, no jaundice, no conjunctival injection or discharge. Hydration status is satisfactory. NECK:  Supple, JVP not seen, no carotid bruits, no palpable lymphadenopathy, no palpable goiter. CHEST:  Clinically clear to auscultation, no wheezes, no crackles. Has left subclavian port-a-cath. HEART:  Sounds 1 and 2 heard, normal, regular, no murmurs. ABDOMEN:  Full, soft, non-tender, no palpable organomegaly, no palpable masses, normal bowel sounds. GENITALIA:  Not examined. LOWER EXTREMITIES:  No pitting edema, palpable peripheral pulses. MUSCULOSKELETAL SYSTEM:  Unremarkable. CENTRAL NERVOUS SYSTEM:  No focal neurologic deficit on gross examination.  Lab Results:  Basename 07/13/11 1700 07/13/11 1237  WBC 7.0 4.2  HGB 13.7 14.3  HCT 38.9 41.0  PLT 247 197    Basename 07/13/11 1700 07/13/11 1237  NA 139 139  K 3.6 3.5  CL 99 100  CO2 20 23  GLUCOSE 76 81  BUN 11 13  CREATININE 1.03 1.11*  CALCIUM 9.9 9.9   Recent Results (from the past 240 hour(s))  MRSA PCR SCREENING     Status: Normal   Collection Time   07/14/11  2:29 AM      Component Value Range Status Comment   MRSA by PCR NEGATIVE  NEGATIVE  Final      Studies/Results: Ct Head W Wo Contrast  07/13/2011  *RADIOLOGY REPORT*  Clinical Data: Seizure  CT HEAD WITHOUT AND WITH CONTRAST  Technique:   Contiguous axial images were obtained from the base of the skull through the vertex without and with intravenous contrast.  Contrast: OMNIPAQUE IOHEXOL 300 MG/ML IJ SOLN  Comparison: 12/08/2010.  Findings: No mass effect, midline shift, acute intracranial hemorrhage, or abnormal enhancement. No skull fracture.  Mastoid air cells and visualized paranasal sinuses are clear.  IMPRESSION: Negative.  Original Report Authenticated By: Donavan Burnet, M.D.   Mr [name] ZO Contrast  07/14/2011  *RADIOLOGY REPORT*  Clinical Data: DCIS.  Bilateral mastectomy.  Recent seizure.  MRI HEAD WITHOUT AND WITH CONTRAST  Technique:  Multiplanar, multiecho pulse sequences of the brain and surrounding structures were obtained according to standard protocol without and with intravenous contrast  Contrast: 11mL MULTIHANCE GADOBENATE DIMEGLUMINE 529 MG/ML IV SOLN  Comparison: CT head earlier in the day  Findings: There is no evidence for acute infarction, intracranial hemorrhage, mass lesion, hydrocephalus, or extra-axial fluid. There is no significant atrophy.  Small foci of increased in the subcortical and periventricular white matter, also involving the brainstem, but nonspecific, but could represent post treatment effect, complicated migraine, chronic microvascular ischemic change, chronic infection, or idiopathic.  Correlate clinically.  Thin section coronal imaging through the temporal lobes demonstrates no focal inflammatory, neoplastic, or destructive process.  No subtemporal extra-axial fluid collection is seen.  Post infusion, there is no abnormal enhancement of the brain or meninges.  There is no evidence for metastatic breast  cancer.  A moderate sized scalp fluid collection, possible hematoma,   is noted in the right parietal region.  There is no visible underlying skull fracture.  Compared with priors, the appearance is similar.  IMPRESSION: No acute intracranial findings.  No evidence for metastatic breast cancer.   Right parietal scalp fluid collection without visible underlying calvarial injury or extra-axial fluid hematoma.  No specific intracranial abnormalities demonstrated which might predispose to seizures.  Mild white matter changes in the supratentorial region as well as the brainstem are nonspecific.  See discussion above.  Original Report Authenticated By: Elsie Stain, M.D.   Dg Chest Port 1 View  07/13/2011  *RADIOLOGY REPORT*  Clinical Data: Seizures  PORTABLE CHEST - 1 VIEW  Comparison: 11/16/2010  Findings: Stable left subclavian Port-A-Cath.  Normal heart size. Clear lungs.  Postoperative changes in the right axilla.  No pneumothorax or pleural effusion.  No consolidation.  IMPRESSION: No active cardiopulmonary disease.  Original Report Authenticated By: Donavan Burnet, M.D.    Medications: Scheduled Meds:   . Chlorhexidine Gluconate Cloth  6 each Topical Q0600  . cholecalciferol  1,000 Units Oral Daily  . citalopram  20 mg Oral Daily  . famotidine  20 mg Oral BID  . LORazepam      . LORazepam      . meperidine (DEMEROL) injection  12.5 mg Intravenous Once  . meperidine (DEMEROL) injection  12.5 mg Intravenous Once  . morphine      . mulitivitamin with minerals  1 tablet Oral Daily  . mupirocin ointment  1 application Nasal BID  . polyethylene glycol  17 g Oral Daily  . sodium chloride  500 mL Intravenous Once  . DISCONTD: multivitamin with minerals  1 tablet Oral Daily  . DISCONTD: mupirocin cream   Topical BID   Continuous Infusions:   . sodium chloride 125 mL/hr at 07/13/11 1930  . sodium chloride 100 mL/hr at 07/14/11 0246   PRN Meds:.ALPRAZolam, gadobenate dimeglumine, HYDROcodone-acetaminophen, iohexol, LORazepam, meperidine (DEMEROL) injection, promethazine  Assessment/Plan:  1. Generalized convulsive seizure/Recurrent:  Patient has had extensive seizure work-up in the past. Head Ct of 07/13/11 is negative, and brain MRI of 07/14/11, shows no acute intracranial  findings. No evidence for metastatic breast cancer, and right parietal scalp fluid collection without visible underlying calvarial injury or extra-axial fluid hematoma. Dr Lajuana Carry has provided a neurology consultation, and has recommended an EEG, which is still pending. We shall manage as recommended. My feeling, is that she will indeed, require anticonvulsant medication. However, will of course, defer to neurologist. 2. Breast cancer: S/p mastectomy, adjuvant chemo and XRT and started on Tamoxifen was started on 07/13/11, by primary oncologist, Dr [name], but is currently on hold. I suspect this can be restarted, but will check with oncologist.  3. Migraine headaches: On prn Demerol and Vicodin. Does not appear problematic at this time. 4. Anxiety disorder/Depression:   Stable on home meds.   LOS: 1 day   Isidora Laham,CHRISTOPHER 07/14/2011, 10:13 AM

## 2011-07-15 ENCOUNTER — Other Ambulatory Visit: Payer: Self-pay | Admitting: *Deleted

## 2011-07-15 DIAGNOSIS — R569 Unspecified convulsions: Secondary | ICD-10-CM

## 2011-07-15 LAB — COMPREHENSIVE METABOLIC PANEL
ALT: 44 U/L — ABNORMAL HIGH (ref 0–35)
Albumin: 3.2 g/dL — ABNORMAL LOW (ref 3.5–5.2)
BUN: 7 mg/dL (ref 6–23)
Calcium: 9 mg/dL (ref 8.4–10.5)
GFR calc Af Amer: >90 mL/min (ref >90)
Glucose, Bld: 97 mg/dL (ref 70–99)
Potassium: 3.8 mEq/L (ref 3.5–5.1)
Sodium: 139 mEq/L (ref 135–145)
Total Protein: 5.4 g/dL — ABNORMAL LOW (ref 6.0–8.3)

## 2011-07-15 LAB — CBC
Hemoglobin: 11.3 g/dL — ABNORMAL LOW (ref 12.0–15.0)
MCH: 32.1 pg (ref 26.0–34.0)
MCHC: 35.2 g/dL (ref 30.0–36.0)
RDW: 12.5 % (ref 11.5–15.5)

## 2011-07-15 NOTE — Progress Notes (Signed)
Subjective: Asymptomatic.  Objective: Vital signs in last 24 hours: Temp:  [98.1 F (36.7 C)-98.3 F (36.8 C)] 98.3 F (36.8 C) (03/15 0604) Pulse Rate:  [55-73] 55  (03/15 0604) Resp:  [16] 16  (03/15 0604) BP: (97-110)/(65-78) 108/78 mmHg (03/15 0604) SpO2:  [95 %-98 %] 98 % (03/15 0604) Weight change: 0 kg (0 lb) Last BM Date: 07/12/11  Intake/Output from previous day: 03/14 0701 - 03/15 0700 In: 1703.3 [P.O.:480; I.V.:1223.3] Out: -      Physical Exam: General: Comfortable, alert, communicative, fully oriented, not short of breath at rest.  HEENT:  No clinical pallor, no jaundice, no conjunctival injection or discharge. Has small right parietal scalp hematoma. Hydration status is satisfactory. NECK:  Supple, JVP not seen, no carotid bruits, no palpable lymphadenopathy, no palpable goiter. CHEST:  Clinically clear to auscultation, no wheezes, no crackles. Has left subclavian port-a-cath. HEART:  Sounds 1 and 2 heard, normal, regular, no murmurs. ABDOMEN:  Full, soft, non-tender, no palpable organomegaly, no palpable masses, normal bowel sounds. GENITALIA:  Not examined. LOWER EXTREMITIES:  No pitting edema, palpable peripheral pulses. MUSCULOSKELETAL SYSTEM:  Unremarkable. CENTRAL NERVOUS SYSTEM:  No focal neurologic deficit on gross examination.  Lab Results:  Basename 07/15/11 0526 07/13/11 1700  WBC 3.6* 7.0  HGB 11.3* 13.7  HCT 32.1* 38.9  PLT 157 247    Basename 07/15/11 0526 07/13/11 1700  NA 139 139  K 3.8 3.6  CL 104 99  CO2 29 20  GLUCOSE 97 76  BUN 7 11  CREATININE 0.81 1.03  CALCIUM 9.0 9.9   Recent Results (from the past 240 hour(s))  MRSA PCR SCREENING     Status: Normal   Collection Time   07/14/11  2:29 AM      Component Value Range Status Comment   MRSA by PCR NEGATIVE  NEGATIVE  Final      Studies/Results: Ct Head W Wo Contrast  07/13/2011  *RADIOLOGY REPORT*  Clinical Data: Seizure  CT HEAD WITHOUT AND WITH CONTRAST  Technique:   Contiguous axial images were obtained from the base of the skull through the vertex without and with intravenous contrast.  Contrast: OMNIPAQUE IOHEXOL 300 MG/ML IJ SOLN  Comparison: 12/08/2010.  Findings: No mass effect, midline shift, acute intracranial hemorrhage, or abnormal enhancement. No skull fracture.  Mastoid air cells and visualized paranasal sinuses are clear.  IMPRESSION: Negative.  Original Report Authenticated By: Donavan Burnet, M.D.   Mr [name] ZO Contrast  07/14/2011  *RADIOLOGY REPORT*  Clinical Data: DCIS.  Bilateral mastectomy.  Recent seizure.  MRI HEAD WITHOUT AND WITH CONTRAST  Technique:  Multiplanar, multiecho pulse sequences of the brain and surrounding structures were obtained according to standard protocol without and with intravenous contrast  Contrast: 11mL MULTIHANCE GADOBENATE DIMEGLUMINE 529 MG/ML IV SOLN  Comparison: CT head earlier in the day  Findings: There is no evidence for acute infarction, intracranial hemorrhage, mass lesion, hydrocephalus, or extra-axial fluid. There is no significant atrophy.  Small foci of increased in the subcortical and periventricular white matter, also involving the brainstem, but nonspecific, but could represent post treatment effect, complicated migraine, chronic microvascular ischemic change, chronic infection, or idiopathic.  Correlate clinically.  Thin section coronal imaging through the temporal lobes demonstrates no focal inflammatory, neoplastic, or destructive process.  No subtemporal extra-axial fluid collection is seen.  Post infusion, there is no abnormal enhancement of the brain or meninges.  There is no evidence for metastatic breast cancer.  A moderate sized  scalp fluid collection, possible hematoma,   is noted in the right parietal region.  There is no visible underlying skull fracture.  Compared with priors, the appearance is similar.  IMPRESSION: No acute intracranial findings.  No evidence for metastatic breast cancer.   Right parietal scalp fluid collection without visible underlying calvarial injury or extra-axial fluid hematoma.  No specific intracranial abnormalities demonstrated which might predispose to seizures.  Mild white matter changes in the supratentorial region as well as the brainstem are nonspecific.  See discussion above.  Original Report Authenticated By: Elsie Stain, M.D.   Dg Chest Port 1 View  07/13/2011  *RADIOLOGY REPORT*  Clinical Data: Seizures  PORTABLE CHEST - 1 VIEW  Comparison: 11/16/2010  Findings: Stable left subclavian Port-A-Cath.  Normal heart size. Clear lungs.  Postoperative changes in the right axilla.  No pneumothorax or pleural effusion.  No consolidation.  IMPRESSION: No active cardiopulmonary disease.  Original Report Authenticated By: Donavan Burnet, M.D.    Medications: Scheduled Meds:    . cholecalciferol  1,000 Units Oral Daily  . citalopram  20 mg Oral Daily  . famotidine  20 mg Oral BID  . feeding supplement  237 mL Oral TID WC  . mulitivitamin with minerals  1 tablet Oral Daily  . polyethylene glycol  17 g Oral Daily  . sodium chloride  500 mL Intravenous Once  . DISCONTD: Chlorhexidine Gluconate Cloth  6 each Topical Q0600  . DISCONTD: mupirocin ointment  1 application Nasal BID   Continuous Infusions:    . sodium chloride 100 mL/hr at 07/15/11 0604  . DISCONTD: sodium chloride 125 mL/hr at 07/13/11 1930   PRN Meds:.ALPRAZolam, HYDROcodone-acetaminophen, HYDROmorphone (DILAUDID) injection, LORazepam, promethazine, DISCONTD: meperidine (DEMEROL) injection  Assessment/Plan:  1. Generalized convulsive seizure/Recurrent:  Patient has had extensive seizure work-up in the past. Head Ct of 07/13/11 is negative, and brain MRI of 07/14/11, shows no acute intracranial findings. No evidence for metastatic breast cancer, and right parietal scalp fluid collection without visible underlying calvarial injury or extra-axial fluid, consistent with hematoma. Dr Lajuana Carry has provided a neurology consultation, and has recommended an EEG, which was done on 07/14/11, and is normal. I did confirm findings with Dr Lyman Speller. 2. Breast cancer: S/p mastectomy, adjuvant chemo and XRT and started on Tamoxifen was started on 07/13/11, by primary oncologist, Dr [name], but is currently on hold. I id discuss with Dr [name] on 07/14/11, and he has okayed holding Tamoxifen, until discharged. 3. Migraine headaches: On prn opioid analgesics. Does not appear problematic at this time. 4. Anxiety disorder/Depression:   Stable on home meds.  Comment: Disposition will depend on neurology recommendations.   LOS: 2 days   Austina Constantin,CHRISTOPHER 07/15/2011, 1:36 PM

## 2011-07-15 NOTE — Progress Notes (Signed)
Comment:  Patient was seen by Dr [name], oncologist on 07/15/11, and input/recommendations are greatly appreciated. Dr Carmell Austria, neurologist, did also see the patient, and recommended referral to psychiatry for management of her psychiatric medications. The plan, was for patient to have a psychiatrist consultation on 07/16/11, but she left AMA.  C. Zyara Riling. MD.

## 2011-07-15 NOTE — Progress Notes (Signed)
Crystal [name]   DOB:09/20/1975   ZO#:[id number]   JWJ#:[id number]  Subjective: Crystal [name] is comfortable in bed, husband at bedside. He is concerned re. her bradicardia, and I don't have a simple explanation why her heart rate would have dropped. EKG 08-05-2022 was unremarkable and her echo last month was fine. She is not on beta- or calcium channel blockers. She is asymptomatic. No further seizure-like episodes   Objective:  Filed Vitals:   07/15/11 1420  BP: 111/78  Pulse: 55  Temp: 98.2 F (36.8 C)  Resp: 16    Body mass index is 20.96 kg/(m^2).  Intake/Output Summary (Last 24 hours) at 07/15/11 1459 Last data filed at 07/14/11 1500  Gross per 24 hour  Intake 1463.33 ml  Output      0 ml  Net 1463.33 ml     Sclerae unicteric  Oropharynx clear  No peripheral adenopathy  Lungs clear -- no rales or rhonchi  Heart regular rate and rhythm, rate currently 55 (down from 80-90 previously)  Abdomen benign  MSK no focal spinal tenderness, no peripheral edema; scalp lesion much reduced  Neuro nonfocal    CBG (last 3)  No results found for this basename: GLUCAP:3 in the last 72 hours   Labs:  Lab Results  Component Value Date   WBC 3.6* 07/15/2011   HGB 11.3* 07/15/2011   HCT 32.1* 07/15/2011   MCV 91.2 07/15/2011   PLT 157 07/15/2011   NEUTROABS 4.3 August 05, 2011     Lab 07/15/11 0526 05-Aug-2011 1700 05-Aug-2011 1237  NA 139 139 139  K 3.8 3.6 3.5  CL 104 99 100  CO2 29 20 23   GLUCOSE 97 76 81  BUN 7 11 13   CREATININE 0.81 1.03 1.11*  CALCIUM 9.0 9.9 9.9  MG -- 1.9 --    liver function tests  Urine Studies No results found for this basename: UACOL:2,UAPR:2,USPG:2,UPH:2,UTP:2,UGL:2,UKET:2,UBIL:2,UHGB:2,UNIT:2,UROB:2,ULEU:2,UEPI:2,UWBC:2,URBC:2,UBAC:2,CAST:2,CRYS:2,UCOM:2,BILUA:2 in the last 72 hours     Studies:  Ct Head W Wo Contrast  05-Aug-2011  *RADIOLOGY REPORT*  Clinical Data: Seizure  CT HEAD WITHOUT AND WITH CONTRAST  Technique:  Contiguous axial images were obtained from  the base of the skull through the vertex without and with intravenous contrast.  Contrast: OMNIPAQUE IOHEXOL 300 MG/ML IJ SOLN  Comparison: 12/08/2010.  Findings: No mass effect, midline shift, acute intracranial hemorrhage, or abnormal enhancement. No skull fracture.  Mastoid air cells and visualized paranasal sinuses are clear.  IMPRESSION: Negative.  Original Report Authenticated By: Donavan Burnet, M.D.   Mr Crystal [name] AO Contrast  07/14/2011  *RADIOLOGY REPORT*  Clinical Data: DCIS.  Bilateral mastectomy.  Recent seizure.  MRI HEAD WITHOUT AND WITH CONTRAST  Technique:  Multiplanar, multiecho pulse sequences of the brain and surrounding structures were obtained according to standard protocol without and with intravenous contrast  Contrast: 11mL MULTIHANCE GADOBENATE DIMEGLUMINE 529 MG/ML IV SOLN  Comparison: CT head earlier in the day  Findings: There is no evidence for acute infarction, intracranial hemorrhage, mass lesion, hydrocephalus, or extra-axial fluid. There is no significant atrophy.  Small foci of increased in the subcortical and periventricular white matter, also involving the brainstem, but nonspecific, but could represent post treatment effect, complicated migraine, chronic microvascular ischemic change, chronic infection, or idiopathic.  Correlate clinically.  Thin section coronal imaging through the temporal lobes demonstrates no focal inflammatory, neoplastic, or destructive process.  No subtemporal extra-axial fluid collection is seen.  Post infusion, there is no abnormal enhancement of the brain or meninges.  There is  no evidence for metastatic breast cancer.  A moderate sized scalp fluid collection, possible hematoma,   is noted in the right parietal region.  There is no visible underlying skull fracture.  Compared with priors, the appearance is similar.  IMPRESSION: No acute intracranial findings.  No evidence for metastatic breast cancer.  Right parietal scalp fluid collection  without visible underlying calvarial injury or extra-axial fluid hematoma.  No specific intracranial abnormalities demonstrated which might predispose to seizures.  Mild white matter changes in the supratentorial region as well as the brainstem are nonspecific.  See discussion above.  Original Report Authenticated By: Elsie Stain, M.D.   Dg Chest Port 1 View  07/13/2011  *RADIOLOGY REPORT*  Clinical Data: Seizures  PORTABLE CHEST - 1 VIEW  Comparison: 11/16/2010  Findings: Stable left subclavian Port-A-Cath.  Normal heart size. Clear lungs.  Postoperative changes in the right axilla.  No pneumothorax or pleural effusion.  No consolidation.  IMPRESSION: No active cardiopulmonary disease.  Original Report Authenticated By: Donavan Burnet, M.D.    Assessment: 36 year old [place] woman, known to be BRCA1 and 2 negative,  (1) status post bilateral mastectomies May of 2012 for a right-sided T1c N1 (stage II) grade 3 invasive ductal carcinoma,estrogen and progesterone receptor positive, HER-2 amplified, with an MIB-1-1 of 85%.  (2) status post carboplatin/docetaxol/trastuzumab x6, completed mid December 2012  (3) continuing on trastuzumab every 3 weeks .  (4) status post radiation therapy, completed 07/12/2011  (5) beginning on tamoxifen March 2013.   Plan: From a breast cancer point of view, patient continues in remission. She should start tamoxifen 20 mg daily now or at the time of discharge, and she will continue to receive trastuzumab as an outpatient, next dose scheduled for April 3d. She has an appointment with [place] April 24th  Will sign off at this time  Lowella Dell 07/15/2011

## 2011-07-15 NOTE — Progress Notes (Signed)
CARE MANAGEMENT NOTE 07/15/2011  Patient:  Crystal [name], Crystal [name]   Account Number:  [id number]  Date Initiated:  07/15/2011  Documentation initiated by:  Paro,Avaiah Stempel  Subjective/Objective Assessment:   change in amount and duration of seizures     Action/Plan:   lives alone   Anticipated DC Date:  07/18/2011   Anticipated DC Plan:  HOME/SELF CARE  In-house referral  NA      DC Planning Services  NA  NA      PAC Choice  NA   Choice offered to / List presented to:  NA   DME arranged  NA      DME agency  NA     HH arranged  NA      HH agency  NA   Status of service:  In process, will continue to follow Medicare Important Message given?  NA - LOS <3 / Initial given by admissions (If response is "NO", the following Medicare IM given date fields will be blank) Date Medicare IM given:   Date Additional Medicare IM given:    Discharge Disposition:    Per UR Regulation:  Reviewed for med. necessity/level of care/duration of stay  If discussed at Long Length of Stay Meetings, dates discussed:    Comments:  [id number]/Debora Stockdale [place],RN,BSN,CCM

## 2011-07-15 NOTE — Procedures (Addendum)
EEG ID:  [id number].  HISTORY:  This is a 36 year old woman referred for rule out seizures per report.  MEDICATIONS:  Ativan.  CONDITION OF RECORDING:  This 16-lead EEG was recorded with the patient in awake and drowsy states.  Background rhythm: background patterns in wakefulness were well organized with a well-sustained posterior dominant rhythm of 8.5 Hz, symmetrical and reactive to eye opening and closing. Drowsiness was associated with mild attenuation of voltage and slowing Of frequencies.  Diffuse beta range patterns were noted.  Abnormal Potentials: no epileptiform activity or focal slowing was noted.  ACTIVATION PROCEDURES:  Hyperventilation was not performed.  Photic stimulation did not activate tracing.  EKG:  Single-channel of EKG monitoring detected an irregular rhythm.  IMPRESSION:  This was an normal awake and drowsy EEG.  A normal EEG does not rule out the clinical diagnosis of epilepsy.  If clinically warranted, a repeat extended EEG or ambulatory recording may be obtained for prolonged recording times and sleep capture, which may increase the diagnostic yield.  Clinical correlation is suggested.          ______________________________ Carmell Austria, MD    VW:[id number] D:  07/14/2011 13:15:20  T:  07/14/2011 22:12:17  Job #:  [id number]

## 2011-07-15 NOTE — Progress Notes (Signed)
Pt not completely satisfied with the plan of care to see the psychiatrist; states that she would rather just leave because she is not going to take any anti-psychotics because she states that she does not need them. Pt husband states that they stopped taking anti-psychotics over a year ago abruptly and was told at that time that she would keep hallucinating and they stated that she has not had any more hallucinations and that she did not like the way that the psychiatrist tried to force her to take those meds. Pt husband spoke for the patient the entire time and stated that he wants what's best for the patient but that he doesn't think that what she is having are panic attack induced seizures as neurology put it to them that she was having. Pt husband states that her stress has been way more increased than this before and that he wanted to know what additional tests could be done. Pt kept looking at her husband asking him what he thought about her leaving and he seemed to push it back over to her and then going into a long story about how they have been through this before and that she seems to be a medical mystery. Offered to pt and husband if they wanted to speak to the doctor about there additional questions and concerns and both declined. MD and charge RN notified.

## 2011-07-15 NOTE — Consult Note (Signed)
Subjective: Patient is doing well.   Objective: Vital signs in last 24 hours: Temp:  [98.1 F (36.7 C)-98.3 F (36.8 C)] 98.3 F (36.8 C) (03/15 0604) Pulse Rate:  [55-73] 55  (03/15 0604) Resp:  [16] 16  (03/15 0604) BP: (97-110)/(65-78) 108/78 mmHg (03/15 0604) SpO2:  [95 %-98 %] 98 % (03/15 0604)  Intake/Output from previous day: 03/14 0701 - 03/15 0700 In: 1703.3 [P.O.:480; I.V.:1223.3] Out: -   Nutritional status: General  Neurological exam: AAO*3. No aphasia. Followed complex commands. Cranial nerves: EOMI, PERRL. Visual fields were full. Sensation to V1 through V3 areas of the face was intact and symmetric throughout. There was no facial asymmetry. Hearing to finger rub was equal and symmetrical bilaterally. Shoulder shrug was 5/5 and symmetric bilaterally. Head rotation was 5/5 bilaterally. There was no dysarthria or palatal deviation. Motor: strength was 5/5 and symmetric throughout. Sensory: was intact throughout to light touch, pinprick, vibration and proprioception. Coordination: finger-to-nose and heel-to-shin were intact and symmetric bilaterally. Reflexes: were 2+ in upper extremities and 1+ at the knees and 1+ at the ankles. Plantar response was downgoing bilaterally. Gait: Romberg test was negative. Tandem gait, toe and heel walk was within normal limits. There was no ataxia noted.  Lab Results:  Basename 07/15/11 0526 07/13/11 1700  WBC 3.6* 7.0  HGB 11.3* 13.7  HCT 32.1* 38.9  PLT 157 247  NA 139 139  K 3.8 3.6  CL 104 99  CO2 29 20  GLUCOSE 97 76  BUN 7 11  CREATININE 0.81 1.03  CALCIUM 9.0 9.9  LABA1C -- --   Studies/Results: Ct Head W Wo Contrast  07/13/2011  *RADIOLOGY REPORT*  Clinical Data: Seizure  CT HEAD WITHOUT AND WITH CONTRAST  Technique:  Contiguous axial images were obtained from the base of the skull through the vertex without and with intravenous contrast.  Contrast: OMNIPAQUE IOHEXOL 300 MG/ML IJ SOLN  Comparison: 12/08/2010.   Findings: No mass effect, midline shift, acute intracranial hemorrhage, or abnormal enhancement. No skull fracture.  Mastoid air cells and visualized paranasal sinuses are clear.  IMPRESSION: Negative.  Original Report Authenticated By: Donavan Burnet, M.D.   Mr [name] JX Contrast  07/14/2011  *RADIOLOGY REPORT*  Clinical Data: DCIS.  Bilateral mastectomy.  Recent seizure.  MRI HEAD WITHOUT AND WITH CONTRAST  Technique:  Multiplanar, multiecho pulse sequences of the brain and surrounding structures were obtained according to standard protocol without and with intravenous contrast  Contrast: 11mL MULTIHANCE GADOBENATE DIMEGLUMINE 529 MG/ML IV SOLN  Comparison: CT head earlier in the day  Findings: There is no evidence for acute infarction, intracranial hemorrhage, mass lesion, hydrocephalus, or extra-axial fluid. There is no significant atrophy.  Small foci of increased in the subcortical and periventricular white matter, also involving the brainstem, but nonspecific, but could represent post treatment effect, complicated migraine, chronic microvascular ischemic change, chronic infection, or idiopathic.  Correlate clinically.  Thin section coronal imaging through the temporal lobes demonstrates no focal inflammatory, neoplastic, or destructive process.  No subtemporal extra-axial fluid collection is seen.  Post infusion, there is no abnormal enhancement of the brain or meninges.  There is no evidence for metastatic breast cancer.  A moderate sized scalp fluid collection, possible hematoma,   is noted in the right parietal region.  There is no visible underlying skull fracture.  Compared with priors, the appearance is similar.  IMPRESSION: No acute intracranial findings.  No evidence for metastatic breast cancer.  Right parietal scalp fluid collection  without visible underlying calvarial injury or extra-axial fluid hematoma.  No specific intracranial abnormalities demonstrated which might predispose to seizures.   Mild white matter changes in the supratentorial region as well as the brainstem are nonspecific.  See discussion above.  Original Report Authenticated By: Elsie Stain, M.D.   Dg Chest Port 1 View  07/13/2011  *RADIOLOGY REPORT*  Clinical Data: Seizures  PORTABLE CHEST - 1 VIEW  Comparison: 11/16/2010  Findings: Stable left subclavian Port-A-Cath.  Normal heart size. Clear lungs.  Postoperative changes in the right axilla.  No pneumothorax or pleural effusion.  No consolidation.  IMPRESSION: No active cardiopulmonary disease.  Original Report Authenticated By: Donavan Burnet, M.D.   Medications: I have reviewed the patient's current medications.  Assessment/Plan: 36 years old woman with history of non-convulsive seizures diagnosed at [facility] in 2009 - who presented with seizure-like activity, but patient acknowledges that she has stress-induced seizures. MRI brain was unremarkable.  1) Patient sees a psychologist 2) Recommend a referral to psychiatry for management of her psychiatric medications 3) Call with questions  LOS: 2 days   Tallie Dodds

## 2011-07-16 ENCOUNTER — Other Ambulatory Visit: Payer: Self-pay | Admitting: Oncology

## 2011-07-17 ENCOUNTER — Telehealth: Payer: Self-pay

## 2011-07-17 NOTE — Telephone Encounter (Signed)
Should patient get rx from oncologist??  Pls advise

## 2011-07-17 NOTE — Telephone Encounter (Signed)
Pt has just gotten out of the hospital from cancer treatments and has been told that she can not drive for 6 months due to seizure but needs the anxiety medication called in   Best number [phone]

## 2011-07-18 ENCOUNTER — Telehealth: Payer: Self-pay | Admitting: Oncology

## 2011-07-18 ENCOUNTER — Ambulatory Visit: Payer: Self-pay | Admitting: Family Medicine

## 2011-07-18 ENCOUNTER — Other Ambulatory Visit: Payer: Self-pay | Admitting: Family Medicine

## 2011-07-18 VITALS — BP 119/86 | HR 80 | Temp 97.9°F | Resp 16 | Ht 66.5 in | Wt 120.0 lb

## 2011-07-18 DIAGNOSIS — R569 Unspecified convulsions: Secondary | ICD-10-CM

## 2011-07-18 DIAGNOSIS — F419 Anxiety disorder, unspecified: Secondary | ICD-10-CM

## 2011-07-18 DIAGNOSIS — F411 Generalized anxiety disorder: Secondary | ICD-10-CM

## 2011-07-18 MED ORDER — ALPRAZOLAM 0.5 MG PO TABS: 0.5000 mg | ORAL_TABLET | Freq: Three times a day (TID) | ORAL | Status: DC | PRN

## 2011-07-18 MED ORDER — ALPRAZOLAM 0.5 MG PO TABS: ORAL_TABLET | ORAL | Status: DC

## 2011-07-18 NOTE — Telephone Encounter (Signed)
.  [facility] PT STATES THE [facility] HAVE REQUESTED A REFILL ON HER XANAX. PLEASE CALL [phone] OR [phone]. SHE IS A CANCER PT AND REALLY NEED HER MEDICINE   [facility] ON [address]

## 2011-07-18 NOTE — Telephone Encounter (Signed)
S/w the pt and she is aware of her April 3rd appts for lab and herceptin and to stop by to pick up an updated April appt calendar

## 2011-07-18 NOTE — Progress Notes (Signed)
Patient Name: Crystal [name] Date of Birth: 1975-07-04 Medical Record Number: [id number] Gender: female Date of Encounter: 07/18/2011  History of Present Illness:  Crystal [name] is a 36 y.o. very pleasant female patient who presents with the following:  She was admitted last Wednesday 3/13 with seizures.  She had several seizures that day- her husband states she had 4 in the space of one hour.  She spent 2 nights at the hospital- she actually left before she was advised to because she was worried about her son being home alone.  Her work-up was not completed, but from what I can gather she was not determined to have definite epileptic activity.  Her MRI was negative for any intracranial lesion or mets.  She did apparently fall and hit her head during one of her seizures and suffered a "goose egg" to the scalp but otherwise was unhurt.   Crystal [name] has lost about 30 lbs from her recent chemo and radiation treatments.  She is on the cusp of finishing her therapy- although she is happy about this she also feels uncertain about her future- what she will do now, if the cancer will return, etc. She has been having some panic attacks recently- she was supposed to see a psychiatrist at the cancer center recently but she had a panic attack and left without seeing them.  She is very concerned about starting medications again.  Crystal [name] is somewhat suspicious of many psychiatric medication, especially antipsychotic.   Crystal [name] needs a xanax refill today- she has continued to be on this medication and needs a refill.  She is currently taking .5 mg, 1 or 2 tablets TID.  She states that lately she has been taking 2 TID more often than not.   She notes no further seizures since she got out of the hospital.    Patient Active Problem List  Diagnoses  . NAUSEA AND VOMITING  . ABDOMINAL PAIN, RIGHT LOWER QUADRANT  . Breast cancer  . Cellulitis and abscess  . Seizure  . Anemia associated with chemotherapy  . Hx  MRSA infection  . Tachycardia - pulse  . Generalized convulsive seizure  . Seizure disorder  . Migraine  . Anxiety disorder  . Depression   Past Medical History  Diagnosis Date  . Allergy   . Right breast ca dx'd 10/2010  . Breast cancer 12/03/10  . Status post chemotherapy     docetaxel/carboplatin/trastuzumab  . Anxiety   . Seizures     last seizure 2 years ago, diagnosed 3 years ago  . Hx MRSA infection 03/28/11    Skin   Past Surgical History  Procedure Date  . Wisdom tooth extraction   . Breast surgery 11/16/2010    bilateral mastectomy+Ralnd,T1cN1a, Her2+,ERPR+  . Biopsy breast 10/14/2010    right needle core biopsy  . Portacath placement 11/16/2010    placement of left subclavian port   History  Substance Use Topics  . Smoking status: Former Smoker -- 2.0 packs/day for 20 years    Types: Cigarettes    Quit date: 11/01/2010  . Smokeless tobacco: Never Used  . Alcohol Use: No     quit drinking 6 months   Family History  Problem Relation Age of Onset  . Cancer Maternal Grandfather    Allergies  Allergen Reactions  . Phenytoin Nausea And Vomiting  . OZH:YQMVHQIONGE+XBMWUXLKG+MWNUUVOZDG Acid+Aspartame Other (See Comments)    faint  . Thorazine (Chlorpromazine Hcl) Hives  . Augmentin Other (See Comments)  Syncope  . Ciprofloxacin Nausea And Vomiting  . Vancomycin Rash    Possible rash reported by MD    Medication list has been reviewed and updated.  Review of Systems: As per HPI- otherwise negative.   Physical Examination: Filed Vitals:   07/18/11 1234  BP: 119/86  Pulse: 80  Temp: 97.9 F (36.6 C)  TempSrc: Oral  Resp: 16  Height: 5' 6.5" (1.689 m)  Weight: 120 lb (54.432 kg)    Body mass index is 19.08 kg/(m^2).  GEN: WDWN, NAD, Non-toxic, A & O x 3, thin HEENT: Atraumatic, Normocephalic. Neck supple. No masses, No LAD.  TM wnl, oropharynx wnl Ears and Nose: No external deformity. CV: RRR, No M/G/R. No JVD. No thrill. No extra heart  sounds. PULM: CTA B, no wheezes, crackles, rhonchi. No retractions. No resp. distress. No accessory muscle use. EXTR: No c/c/e NEURO Normal gait.  PSYCH: Normally interactive. Conversant. Not depressed or anxious appearing.  Calm demeanor.    Assessment and Plan: 1. Seizure  TSH  2. Anxiety  ALPRAZolam (XANAX) 0.5 MG tablet, DISCONTINUED: ALPRAZolam (XANAX) 0.5 MG tablet   I did refill her xanax today- Crystal [name] is worried about having to come into the doctor's office when she is immunocompromised and requested a few refills- I did give her 4 refills today. The origin of her seizures is still uncertain- her husband Crystal [name] stated that she has been told she had epilepsy, but also been told that she had pseudoseizures.  Crystal [name] likely needs to see a psychiatrist, but she is very apprehensive about taking this step. (her husband Crystal [name] is also suspicious of psychiatrists and many medications.)  For now she is willing to re-establish with outpt neurology. We will try and pin down the cause of her seizures.  However, if there is no organic cause of her seizures found a psychiatric evaluation is a good idea.  Crystal [name] will think about this and hopefully become more comfortable with the idea in the coming days.

## 2011-07-18 NOTE — Telephone Encounter (Signed)
LM for patient to call if she had any problem picking up RX from [facility].

## 2011-07-18 NOTE — Telephone Encounter (Signed)
Pt was seen today and Rx was written.

## 2011-07-18 NOTE — Telephone Encounter (Signed)
Chart pulled to PA 

## 2011-07-19 ENCOUNTER — Encounter: Payer: Self-pay | Admitting: Specialist

## 2011-07-19 ENCOUNTER — Ambulatory Visit: Payer: Self-pay | Admitting: Physician Assistant

## 2011-07-19 LAB — TSH: TSH: 0.347 u[IU]/mL — ABNORMAL LOW (ref 0.350–4.500)

## 2011-07-19 LAB — T4, FREE: Free T4: 0.88 ng/dL (ref 0.80–1.80)

## 2011-07-19 MED ORDER — GADOBENATE DIMEGLUMINE 529 MG/ML IV SOLN
15.0000 mL | Freq: Once | INTRAVENOUS | Status: AC | PRN
  Administered 2011-07-19: 11 mL via INTRAVENOUS

## 2011-07-20 ENCOUNTER — Encounter: Payer: Self-pay | Admitting: Family Medicine

## 2011-07-21 ENCOUNTER — Telehealth: Payer: Self-pay | Admitting: *Deleted

## 2011-07-21 ENCOUNTER — Telehealth (HOSPITAL_COMMUNITY): Payer: Self-pay | Admitting: Internal Medicine

## 2011-07-21 NOTE — Telephone Encounter (Signed)
Echos reviewed at length today.  10/12/10 EF 55% lateral s' 12 (poor window) 03/14/11 EF 65% lateral s' 16 06/28/11 EF 50% lateral s' 12  Most recent EF looks just a bit lower than EF in 6/12 but lateral s' stable. Suspect she may have been anemic or some other reason for higher EF in 11/12. At this point i Have some concern for early Herceptin cardiotoxicity but would recommend continuing Herceptin with very close f/u of EF if patient agrees to f/u. Would initiate carvedilol 3.125 bid for cardioprotection.

## 2011-07-21 NOTE — Telephone Encounter (Signed)
PT.STATED "HER HUSBAND IS MAKING HER CANCEL ALL OF HER APPOINTMENTS." THIS MESSAGE WAS GIVEN TO DR.[name]'S NURSE, VAL DODD,RN.

## 2011-07-25 ENCOUNTER — Ambulatory Visit: Payer: Self-pay | Admitting: Family Medicine

## 2011-07-25 ENCOUNTER — Ambulatory Visit (HOSPITAL_COMMUNITY): Payer: Self-pay

## 2011-07-25 VITALS — BP 89/70 | HR 92 | Temp 98.1°F | Resp 18 | Ht 66.5 in | Wt 116.0 lb

## 2011-07-25 DIAGNOSIS — F411 Generalized anxiety disorder: Secondary | ICD-10-CM

## 2011-07-25 DIAGNOSIS — L0390 Cellulitis, unspecified: Secondary | ICD-10-CM

## 2011-07-25 MED ORDER — DOXYCYCLINE HYCLATE 100 MG PO TABS: 100.0000 mg | ORAL_TABLET | Freq: Two times a day (BID) | ORAL | Status: DC

## 2011-07-25 MED ORDER — SULFAMETHOXAZOLE-TMP DS 800-160 MG PO TABS: 2.0000 | ORAL_TABLET | Freq: Two times a day (BID) | ORAL | Status: AC

## 2011-07-25 NOTE — Progress Notes (Signed)
Patient Name: Crystal [name] Date of Birth: [birth year] Medical Record Number: [id number] Gender: female Date of Encounter: 07/25/2011  History of Present Illness:  Crystal [name] is a 36 y.o. very pleasant female patient who presents with the following:  Is here today to re-evaluate stress/ anxiety and depression and a "staph infection" under her left arm.  It has been there for about one week, is getting worse, has not drained yet.  She has had MRSA in the past and thinks this may be a recurrence- no fever however. The area has been getting larger and more tender, and she would like to have it drained.   She was recently hospitalized for seizures which seemed to upset her mental state and "put her over the edge." Crystal [name] has a history of mental illness, I am not sure if she has ever had a firm diagnosis.   Today Crystal [name] is tearful and upset- "I'm losing it right now."  She denies any desire to hurt herself, but states "I'm just nervous all the time right now."  'I just need to calm down and have a break, man!"  She wants to D/C her Tamoxifen and Herceptin because she is afraid of the SE- she plans to follow- up with heme- onc in the future but is not sure if she will continue her treatments right now.  Crystal [name] was to see a psychiatrist while in the hospital for her seizures earlier this month but she left AMA.  She also was to see the PhD psychologist at the cancer center but "had a panic attack and ran out" prior to her appointment.  She has seen a few psychiatrists in the past, but has not agreed to her treatments plans, generally feeling that a lot of psychiatric medications make her "into a zombie."   Patient Active Problem List  Diagnoses  . NAUSEA AND VOMITING  . ABDOMINAL PAIN, RIGHT LOWER QUADRANT  . Breast cancer  . Cellulitis and abscess  . Seizure  . Anemia associated with chemotherapy  . Hx MRSA infection  . Tachycardia - pulse  . Generalized convulsive seizure  . Seizure  disorder  . Migraine  . Anxiety disorder  . Depression   Past Medical History  Diagnosis Date  . Allergy   . Right breast ca dx'd 10/2010  . Breast cancer 12/03/10  . Status post chemotherapy     docetaxel/carboplatin/trastuzumab  . Anxiety   . Seizures     last seizure 2 years ago, diagnosed 3 years ago  . Hx MRSA infection 03/28/11    Skin   Past Surgical History  Procedure Date  . Wisdom tooth extraction   . Breast surgery 11/16/2010    bilateral mastectomy+Ralnd,T1cN1a, Her2+,ERPR+  . Biopsy breast 10/14/2010    right needle core biopsy  . Portacath placement 11/16/2010    placement of left subclavian port   History  Substance Use Topics  . Smoking status: Former Smoker -- 2.0 packs/day for 20 years    Types: Cigarettes    Quit date: 11/01/2010  . Smokeless tobacco: Never Used  . Alcohol Use: No     quit drinking 6 months   Family History  Problem Relation Age of Onset  . Cancer Maternal Grandfather    Allergies  Allergen Reactions  . Phenytoin Nausea And Vomiting  . YQM:VHQIONGEXBM+WUXLKGMWN+UUVOZDGUYQ Acid+Aspartame Other (See Comments)    faint  . Thorazine (Chlorpromazine Hcl) Hives  . Augmentin Other (See Comments)    Syncope  . Ciprofloxacin Nausea And  Vomiting  . Vancomycin Rash    Possible rash reported by MD    Medication list has been reviewed and updated.  Review of Systems: As per HPI- otherwise negative.   Physical Examination: Filed Vitals:   07/25/11 1036  BP: 89/70  Pulse: 92  Temp: 98.1 F (36.7 C)  TempSrc: Oral  Resp: 18  Height: 5' 6.5" (1.689 m)  Weight: 116 lb (52.617 kg)   Recheck BP 110/ 80, pulse 70.   Body mass index is 18.44 kg/(m^2).  GEN: WDWN, NAD, Non-toxic, A & O x 3, thin, tearful HEENT: Atraumatic, Normocephalic. Neck supple. No masses, No LAD. Ears and Nose: No external deformity. CV: RRR, No M/G/R. No JVD. No thrill. No extra heart sounds. PULM: CTA B, no wheezes, crackles, rhonchi. No retractions. No  resp. distress. No accessory muscle use. ABD/thorax: S, NT, ND.  S/p mastectomy.  Subclavian port visible left chest.   There is a fluctuant, firm area under her left arm.  There is a pustule draining some pus on the main fluctuant area, and a few scattered small pustules adjacent which appear to have originated from shaving the area.  EXTR: No c/c/e NEURO Normal gait.  PSYCH: Normally interactive. Conversant. Non- psychotic  Suspect abscess under left arm in pt with history of MRSA.  VC obtained- cleaned area carefully with betadine and alcohol. Anest with 1% lidocaine, about 1.5cc.  I and D with 11 blade- however really no pus expressed except a minimal amount from "head" of pustule described above.  Explored wound gently with sterile forceps in case of loculations but none found/ no pus.   Dressed area.  Did not place packing  Assessment and Plan: 1. Cellulitis  DISCONTINUED: doxycycline (VIBRA-TABS) 100 MG tablet  recurrent abscess/ folliculitis.  Started doxy as above- later received call that this is on back order so septra substituted.  As area did not drain well I asked Crystal [name] to come in tomorrow so I can check the area.   Crystal [name] is not psychotic or a danger to herself or others at this time.  However, she does have underlying mental illness which is now worse- she has anxiety and may have other diagnoses as well.  I really want her to see a psychiatrist- however she is fairly resistant to this idea due to her fear of medications.  I did call the cancer center and they may be able to try and have her seen again by the therapist there.  However, they are uncertain about this relationship as she left the office in a panic before her last appointment there.  I urged Anyla and her husband to make use of Behavioral health if things get worse, and I will attempt to locate a psychiatrist who will be willing to see her.

## 2011-07-25 NOTE — Progress Notes (Signed)
[facility] called because Doxy is too expensive.  Spoke with Dr. Patsy Lager by phone.  Change to septra DS. Spoke with pharm.

## 2011-07-26 ENCOUNTER — Ambulatory Visit (INDEPENDENT_AMBULATORY_CARE_PROVIDER_SITE_OTHER): Payer: Self-pay | Admitting: Family Medicine

## 2011-07-26 VITALS — BP 103/71 | HR 83 | Temp 97.5°F | Resp 16 | Ht 65.5 in | Wt 117.0 lb

## 2011-07-26 DIAGNOSIS — L02219 Cutaneous abscess of trunk, unspecified: Secondary | ICD-10-CM

## 2011-07-26 DIAGNOSIS — L03319 Cellulitis of trunk, unspecified: Secondary | ICD-10-CM

## 2011-07-26 DIAGNOSIS — Z5189 Encounter for other specified aftercare: Secondary | ICD-10-CM

## 2011-07-26 NOTE — Progress Notes (Signed)
  Patient Name: Crystal [name] Date of Birth: 1976-03-16 Medical Record Number: [id number] Gender: female Date of Encounter: 07/26/2011  History of Present Illness:  Crystal [name] is a 36 y.o. very pleasant female patient who presents with the following:  Here today to recheck the area under her left arm- attempted I and D yesterday not not much pus expressed.  The area is about the same today- she has no fever or other symptoms and feels calmer today.   Patient Active Problem List  Diagnoses  . NAUSEA AND VOMITING  . ABDOMINAL PAIN, RIGHT LOWER QUADRANT  . Breast cancer  . Cellulitis and abscess  . Seizure  . Anemia associated with chemotherapy  . Hx MRSA infection  . Tachycardia - pulse  . Generalized convulsive seizure  . Seizure disorder  . Migraine  . Anxiety disorder  . Depression   Past Medical History  Diagnosis Date  . Allergy   . Right breast ca dx'd 10/2010  . Breast cancer 12/03/10  . Status post chemotherapy     docetaxel/carboplatin/trastuzumab  . Anxiety   . Seizures     last seizure 2 years ago, diagnosed 3 years ago  . Hx MRSA infection 03/28/11    Skin   Past Surgical History  Procedure Date  . Wisdom tooth extraction   . Breast surgery 11/16/2010    bilateral mastectomy+Ralnd,T1cN1a, Her2+,ERPR+  . Biopsy breast 10/14/2010    right needle core biopsy  . Portacath placement 11/16/2010    placement of left subclavian port   History  Substance Use Topics  . Smoking status: Former Smoker -- 2.0 packs/day for 20 years    Types: Cigarettes    Quit date: 11/01/2010  . Smokeless tobacco: Never Used  . Alcohol Use: No     quit drinking 6 months   Family History  Problem Relation Age of Onset  . Cancer Maternal Grandfather    Allergies  Allergen Reactions  . Phenytoin Nausea And Vomiting  . NWG:NFAOZHYQMVH+QIONGEXBM+WUXLKGMWNU Acid+Aspartame Other (See Comments)    faint  . Thorazine (Chlorpromazine Hcl) Hives  . Augmentin Other (See  Comments)    Syncope  . Ciprofloxacin Nausea And Vomiting  . Vancomycin Rash    Possible rash reported by MD    Medication list has been reviewed and updated.  Review of Systems: As per HPI- otherwise negative. Tolerating septra ok  Physical Examination: Filed Vitals:   07/26/11 1130  BP: 103/71  Pulse: 83  Temp: 97.5 F (36.4 C)  TempSrc: Oral  Resp: 16  Height: 5' 5.5" (1.664 m)  Weight: 117 lb (53.071 kg)    Body mass index is 19.17 kg/(m^2).   GEN: WDWN, NAD, Non-toxic, Alert & Oriented x 3 HEENT: Atraumatic, Normocephalic.  Ears and Nose: No external deformity. EXTR: No clubbing/cyanosis/edema NEURO: Normal gait.  PSYCH: Normally interactive. Conversant. Not depressed or anxious appearing.  Calm demeanor.  Left axilla- firm indurated area is basically unchanged, but adjacent folliculitis looks better.  The main area is not worse and is not more tender  Assessment and Plan: Recheck of infection in left axilla.  I think the area is overall better- continue septra, warm compresses BID.  Let me know if not continuing to improve.  [name] still needs to see a psychiatrist- I was able to call Dr. Loralie Champagne office and she or one of her NPs could see Crystal [name] if they like.  Will ask staff to call and give Crystal [name] this message.

## 2011-07-27 ENCOUNTER — Telehealth: Payer: Self-pay

## 2011-07-27 NOTE — Telephone Encounter (Signed)
Message copied by Jerelene Redden on Wed Jul 27, 2011  2:12 PM ------      Message from: Pearline Cables      Created: Tue Jul 26, 2011  5:37 PM       Please call [facility] for me- they are looking for a new private psychiatrist who takes cash.  Dr. Ann Maki McKinney's office is taking new patients.  Either Dr. Nolen Mu or one of her 2 NP would be fine to see.  Please ask Mckaylee or her husband carl to try giving Dr. Murvin Natal office a call            Phone: [phone]_             Fax: [phone]            Thanks!!

## 2011-07-27 NOTE — Telephone Encounter (Signed)
Called pt and gave her info from Dr Patsy Lager. Pt agreed to call

## 2011-07-28 ENCOUNTER — Encounter: Payer: Self-pay | Admitting: Radiation Oncology

## 2011-07-28 ENCOUNTER — Telehealth: Payer: Self-pay

## 2011-07-28 NOTE — Telephone Encounter (Signed)
Received message from pt stating that she wants to know is there something to make her want to eat.  Called pt back [phone], and she states she used to weigh 150 lbs, and she got on the scale this morning, and she weights 113 lbs.  Pt states "her stomach has hurt", and she can look at food and not want it.  Pt states she has phenergan, and that does help some, just got a refill yesterday.  Pt states she has taken zofran prior, but this gave her headaches.  Pt denies needing an appetite stimulant, but wants to know is there anything else that could help with her nausea.  Pt states she does not have insurance, so would prefer something not expensive.  Pharmacy is [facility] on [address].  Informed her will leave note for MD to review, and office will call her back.  She verbalizes understanding.

## 2011-07-28 NOTE — Discharge Summary (Signed)
Diagnoses:  1. Generalized convulsive seizure/Recurrent:  Patient has had extensive seizure work-up in the past. Head Ct of 07/13/11 was negative, and brain MRI of 07/14/11, showed no acute intracranial findings. No evidence for metastatic breast cancer, and right parietal scalp fluid collection without visible underlying calvarial injury or extra-axial fluid, consistent with hematoma. Dr Lajuana Carry provided a neurology consultation, and recommended an EEG, which was done on 07/14/11, and was normal. I did confirm findings with Dr Lyman Speller.  2. Breast cancer:  S/p mastectomy, adjuvant chemo and XRT and started on Tamoxifen was started on 07/13/11, by primary oncologist, Dr [name], but is currently on hold. I did discuss with Dr [name] on 07/14/11, and he has okayed holding Tamoxifen, until discharged.  3. Migraine headaches: On prn opioid analgesics. Does not appear problematic at this time.  4. Anxiety disorder/Depression:  Stable on home meds.   Comment:  Patient was seen by Dr [name], oncologist on 07/15/11, and input/recommendations are greatly appreciated. Dr Carmell Austria, neurologist, did also see the patient, and recommended referral to psychiatry for management of her psychiatric medications. The plan, was for patient to have a psychiatrist consultation on 07/16/11, but she left AMA.  C. Aveion Nguyen. MD.

## 2011-07-28 NOTE — Progress Notes (Signed)
  Radiation Oncology         [phone] ________________________________  Name: Crystal [name] MRN: [id number]  Date: 07/28/2011  DOB: 1975-10-22  End of Treatment Note  Diagnosis:   Right Breast Cancer     Indication for treatment:  Curative       Radiation treatment dates:   1/10-3/12/13  Site/dose:    1) Right Chest Wall / 50.4 Gy at 1.8 Gy per fraction 2) Right Supraclavicular Fossa / 45 Gy at 1.8 Gy per fraction 3) Right Scar / 10 Gy at 2 Gy per fraction  Beams/energy:    1) Opposed tangents with reduced fields/ 6 MV photons 2) Left anterior oblique/ 6 MV photons 3) En face electrons/ 6 MeV electrons  Narrative: The patient tolerated radiation treatment relatively well.   She had a significant amount of anxiety and was started on an anti-depressant.  She also spoke to our chaplain several times which was helpful for her.  She had the expected skin redness in the treatment field.   Plan: The patient has completed radiation treatment. The patient will return to radiation oncology clinic for routine followup in one month. I advised them to call or return sooner if they have any questions or concerns related to their recovery or treatment. ________________________________

## 2011-07-28 NOTE — Progress Notes (Signed)
[facility] Radiation Oncology Simulation Verification Note   Name: Crystal [name] MRN: [id number]   Date: 05/11/2011 DOB: 01-27-1976  Status:outpatient   DIAGNOSIS:  Right Breast Cancer  POSITION: Patient was placed in the supine position on the treatment machine.  Isocenter and MLCS were reviewed and treatment was approved.  NARRATIVE: Patient tolerated simulation well.

## 2011-08-03 ENCOUNTER — Other Ambulatory Visit: Payer: Self-pay | Admitting: Lab

## 2011-08-03 ENCOUNTER — Telehealth: Payer: Self-pay | Admitting: *Deleted

## 2011-08-03 ENCOUNTER — Ambulatory Visit: Payer: Self-pay

## 2011-08-03 NOTE — Telephone Encounter (Signed)
Patient called and left a message to cancel her appointments for today. The message stated that she will call back to reschedule. Desk RN notified.  JMW

## 2011-08-04 ENCOUNTER — Ambulatory Visit: Admission: RE | Admit: 2011-08-04 | Payer: Self-pay | Source: Ambulatory Visit | Admitting: Radiation Oncology

## 2011-08-18 ENCOUNTER — Telehealth: Payer: Self-pay | Admitting: *Deleted

## 2011-08-18 ENCOUNTER — Encounter: Payer: Self-pay | Admitting: Specialist

## 2011-08-18 NOTE — Progress Notes (Signed)
The patient called to speak to me, since she could not reach Juanetta Beets, counseling intern, who has been providing counseling services to her.  She described her feelings as being very sad and that she felt very tired and stressed.  She said that she had discontinued her medication for depression but had gone back on it but could not remember when she restarted the medication.  Crystal [name] has an appointment on 4/18(tomorrow) with Elige Radon.  She did say she was not suicidal.  Crystal [name] said that one coping mechanism she had found useful was to get exercise by taking a walk, so she planned to do that today.  I asked her to report back to me after her walk.  I spoke with Juanetta Beets and relayed to him the nature of our conversation.  I also spoke with Zollie Scale, PA, who said she would call Crystal [name] on 4/18 and perhaps ask Crystal [name] to check in with her today.

## 2011-08-18 NOTE — Telephone Encounter (Signed)
This RN spoke with pt per request from AB/PA and per pt' message on this VM.  Per VM pt stated concern with port due to mild sensitivity. Pt denies redness, hot or swollen nor is it painful to touch.  Crystal [name] will monitor and if needed call otherwise she will follow up with AB/PA as scheduled next week.  This RN did inquire with Crystal [name] her request regarding medication for her bipolar-  Per discussion she states she stopped the celexa ( originally prescribed by Dr Michell Heinrich )  "something I do sometimes thinking I don't need medication which is kinda stupid of me " " I was told I am bipolar by a MD " " people around me noticed I was being really hyper and so I restarted it over a week ago at the 20mg  dose "  Crystal [name] states she now feels she is on the down side of her bipolar " just tired, crying .Marland Kitchen.."   Per direct inquiry pt denies suicidial ideation.  Crystal [name] is wanting to know if she could increase the celexa to 40mg  ( has been on this amount before ) for better control of symptoms.  This RN discussed with pt medications for mental health and possible need to see a pyschiatrist for appropriate prescriptions.  Crystal [name] is very hesitant to return to a psychiatrist due to "they always put me on all kinds of medications and make me feel too out of it ".  This RN will discuss the above with AB/PA.

## 2011-08-18 NOTE — Progress Notes (Signed)
The patient called back to report that she had taken a walk and felt much less stressed and more calm.  She did indicate she had also talked to Mena Pauls, Charity fundraiser.

## 2011-08-19 ENCOUNTER — Other Ambulatory Visit: Payer: Self-pay | Admitting: *Deleted

## 2011-08-19 MED ORDER — CITALOPRAM HYDROBROMIDE 20 MG PO TABS: 20.0000 mg | ORAL_TABLET | Freq: Every day | ORAL | Status: DC

## 2011-08-24 ENCOUNTER — Telehealth: Payer: Self-pay | Admitting: *Deleted

## 2011-08-24 ENCOUNTER — Ambulatory Visit (HOSPITAL_BASED_OUTPATIENT_CLINIC_OR_DEPARTMENT_OTHER): Payer: Self-pay

## 2011-08-24 ENCOUNTER — Ambulatory Visit: Payer: Self-pay | Admitting: Physician Assistant

## 2011-08-24 ENCOUNTER — Encounter: Payer: Self-pay | Admitting: Physician Assistant

## 2011-08-24 ENCOUNTER — Other Ambulatory Visit: Payer: Self-pay | Admitting: Lab

## 2011-08-24 VITALS — BP 116/74 | HR 81 | Temp 97.9°F | Ht 65.5 in | Wt 116.4 lb

## 2011-08-24 DIAGNOSIS — R569 Unspecified convulsions: Secondary | ICD-10-CM

## 2011-08-24 DIAGNOSIS — C50919 Malignant neoplasm of unspecified site of unspecified female breast: Secondary | ICD-10-CM

## 2011-08-24 DIAGNOSIS — Z5112 Encounter for antineoplastic immunotherapy: Secondary | ICD-10-CM

## 2011-08-24 LAB — COMPREHENSIVE METABOLIC PANEL
ALT: 18 U/L (ref 0–35)
Albumin: 3.8 g/dL (ref 3.5–5.2)
CO2: 24 mEq/L (ref 19–32)
Calcium: 8.3 mg/dL — ABNORMAL LOW (ref 8.4–10.5)
Chloride: 108 mEq/L (ref 96–112)
Potassium: 3.4 mEq/L — ABNORMAL LOW (ref 3.5–5.3)
Sodium: 140 mEq/L (ref 135–145)
Total Protein: 5.6 g/dL — ABNORMAL LOW (ref 6.0–8.3)

## 2011-08-24 LAB — CBC WITH DIFFERENTIAL/PLATELET
BASO%: 0.7 % (ref 0.0–2.0)
Eosinophils Absolute: 0.2 10*3/uL (ref 0.0–0.5)
MCHC: 35.5 g/dL (ref 31.5–36.0)
MONO#: 0.4 10*3/uL (ref 0.1–0.9)
NEUT#: 4.1 10*3/uL (ref 1.5–6.5)
RBC: 4.2 10*6/uL (ref 3.70–5.45)
WBC: 5.9 10*3/uL (ref 3.9–10.3)
lymph#: 1.1 10*3/uL (ref 0.9–3.3)

## 2011-08-24 LAB — CANCER ANTIGEN 27.29: CA 27.29: 16 U/mL (ref 0–39)

## 2011-08-24 MED ORDER — SODIUM CHLORIDE 0.9 % IJ SOLN
10.0000 mL | INTRAMUSCULAR | Status: DC | PRN
  Filled 2011-08-24: qty 10

## 2011-08-24 MED ORDER — DIPHENHYDRAMINE HCL 25 MG PO CAPS
25.0000 mg | ORAL_CAPSULE | Freq: Once | ORAL | Status: AC
  Administered 2011-08-24: 25 mg via ORAL

## 2011-08-24 MED ORDER — SODIUM CHLORIDE 0.9 % IV SOLN: Freq: Once | INTRAVENOUS | Status: DC

## 2011-08-24 MED ORDER — ACETAMINOPHEN 325 MG PO TABS
650.0000 mg | ORAL_TABLET | Freq: Once | ORAL | Status: AC
  Administered 2011-08-24: 650 mg via ORAL

## 2011-08-24 MED ORDER — HEPARIN SOD (PORK) LOCK FLUSH 100 UNIT/ML IV SOLN
500.0000 [IU] | Freq: Once | INTRAVENOUS | Status: AC | PRN
  Administered 2011-08-24: 500 [IU]
  Filled 2011-08-24: qty 5

## 2011-08-24 MED ORDER — TRASTUZUMAB CHEMO INJECTION 440 MG
6.0000 mg/kg | Freq: Once | INTRAVENOUS | Status: AC
  Administered 2011-08-24: 336 mg via INTRAVENOUS
  Filled 2011-08-24: qty 16

## 2011-08-24 NOTE — Patient Instructions (Signed)
[facility] Discharge Instructions for Patients Receiving Chemotherapy  Today you received the following chemotherapy agents herceptin  To help prevent nausea and vomiting after your treatment, we encourage you to take your nausea medication  and take it as often as prescribedIf you develop nausea and vomiting that is not controlled by your nausea medication, call the clinic. If it is after clinic hours your family physician or the after hours number for the clinic or go to the Emergency Department.   BELOW ARE SYMPTOMS THAT SHOULD BE REPORTED IMMEDIATELY:  *FEVER GREATER THAN 100.5 F  *CHILLS WITH OR WITHOUT FEVER  NAUSEA AND VOMITING THAT IS NOT CONTROLLED WITH YOUR NAUSEA MEDICATION  *UNUSUAL SHORTNESS OF BREATH  *UNUSUAL BRUISING OR BLEEDING  TENDERNESS IN MOUTH AND THROAT WITH OR WITHOUT PRESENCE OF ULCERS  *URINARY PROBLEMS  *BOWEL PROBLEMS  UNUSUAL RASH Items with * indicate a potential emergency and should be followed up as soon as possible.  One of the nurses will contact you 24 hours after your treatment. Please let the nurse know about any problems that you may have experienced. Feel free to call the clinic you have any questions or concerns. The clinic phone number is [phone].   I have been informed and understand all the instructions given to me. I know to contact the clinic, my physician, or go to the Emergency Department if any problems should occur. I do not have any questions at this time, but understand that I may call the clinic during office hours   should I have any questions or need assistance in obtaining follow up care.    __________________________________________  _____________  __________ Signature of Patient or Authorized Representative            Date                   Time    __________________________________________ Nurse's Signature    

## 2011-08-24 NOTE — Progress Notes (Signed)
ID: Crystal [name]   DOB: [birth year]  MR#: [id number]  WUJ#:[id number]  HISTORY OF PRESENT ILLNESS: The patient is a 36 year old [place] woman who noted a mass in her right breast and brought it to her primary care physician's attention June of 2012. She was set up for mammography at [facility], where an area of calcifications highly suspicious for carcinoma was biopsied. This was in the upper outer quadrant and it measured 1.7 cm mammographically and 1.8 cm by ultrasonography. The pathology report ([id number]) showed a high-grade invasive ductal carcinoma estrogen receptor positive at 64% progesterone receptor positive at 18%, and HER-2 amplified with a ratio by CISH of 5.24. MIB-1-1 was 65%. A second mass biopsied at the same time was also triple positive.  Given her multiple masses in the right breast, mastectomy was recommended. The patient actually opted for bilateral mastectomies and this was performed together with a right axillary lymph node dissection under Dr. Felicity Pellegrini on 11/16/2010. The final pathology [id number]) showed, on the left, no evidence of cancer. On the right the patient had a high-grade invasive ductal carcinoma measuring 1.9 cm, with negative margins, grade 3, involving 2 of 16 lymph nodes (stage IIB).  Crystal [name] was treated in the adjuvant setting, with 6 doses of docetaxel/carboplatin/trastuzumab, completed December 2012. She is status post radiation therapy.  She continues on trastuzumab every 3 weeks.  INTERVAL HISTORY:  Crystal [name] returns today accompanied by her husband, Royal Hawthorn, for followup of her right breast carcinoma.  She is due for her next every 3 week dose of trastuzumab.  Following our last appointment here on 07/13/2011, Crystal [name] was seen in the hospital for seizures. This is all documented in her chart. Following that episode, however, she tells me she "just needed a break from everything". She subsequently canceled her followup appointment with Dr. Michell Heinrich. She also  canceled her labs and trastuzumab here earlier this month. She continues to be seen by our counselor, Elige Radon, which she finds very helpful. She has, however, continue to have problems with depression and anxiety. She denies suicidal ideations. She continues on Celexa 20 mg daily.   At her last appointment here, we also discussed beginning on tamoxifen. Crystal [name] has the appointment, but admits that she never had it filled and has not started on the medication. It seems like she is getting a lot of implant from a lot of places and is just a little "overwhelmed". This makes it very difficult for her to make a decision she tells me. Her husband also had multiple questions about the safety and efficacy of the tamoxifen. Over half of our 45 minute appointment today was spent answering multiple questions, discussing crystals prognosis, counseling her regarding treatment, and coordinating care.   Crystal [name] appears to be tolerating the trastuzumab well. I will note that there was a slight decrease in her ejection fraction according to her most recent echo in late February. She continues to be followed by Dr. Gala Romney who has recommended continuing trastuzumab with close followup.  REVIEW OF SYSTEMS: Crystal [name]'s energy level is good. She has had no illnesses and denies fevers, chills, or night sweats. She is eating well with no nausea. No change in bowel habits. No chest pain, cough, or shortness of breath. No orthopnea. No peripheral swelling. No unusual headaches or dizziness. No unusual myalgias, arthralgias, or bony pain.   A detailed review of systems is otherwise noncontributory as noted below.  PAST MEDICAL HISTORY: Past Medical History  Diagnosis Date  . Allergy   . Right  breast ca dx'd 10/2010  . Breast cancer 12/03/10  . Status post chemotherapy     docetaxel/carboplatin/trastuzumab  . Anxiety   . Seizures     last seizure 2 years ago, diagnosed 3 years ago  . Hx MRSA infection 03/28/11    Skin    1. History of seizures. The patient has been thoroughly evaluated both here by Dr. [name] and at [facility] for this. Among many studies, she had an MRI of the brain on August 28th. This showed an incidental solitary small lesion in the left frontoparietal white matter. It was not felt to be suggestive of multiple sclerosis. A noncontrasted CT in August 2010 was negative. The patient has been off all seizure medications now for 2 months. There has been no evidence of seizure recurrence. 2. Complex psychology history (please refer to Dr. [name] note in the E-chart from August 29, 2009) with evidence of anxiety disorder, depression and psychosis. The patient is currently on no medications for this and denies any of these symptoms at present. 3. History of migraines. 4. Remote history of multidrug abuse. 5. Status post wisdom teeth extraction.  History of MRSA skin infection   PAST SURGICAL HISTORY: Past Surgical History  Procedure Date  . Wisdom tooth extraction   . Breast surgery 11/16/2010    bilateral mastectomy+Ralnd,T1cN1a, Her2+,ERPR+  . Biopsy breast 10/14/2010    right needle core biopsy  . Portacath placement 11/16/2010    placement of left subclavian port    FAMILY HISTORY Family History  Problem Relation Age of Onset  . Cancer Maternal Grandfather   The patient's mother is living, in her mid 81s, with no history of breast or ovarian cancer. The patient does not know her biological father. The patient has an older brother with bipolar disease, and according to the patient, borderline schizophrenia.   GYNECOLOGIC HISTORY: Menarche at age 53. She is G1, P1 with first pregnancy to term at age 89. The patient had significant pelvic pain recently and was evaluated with transvaginal and pelvic ultrasound on July 17th by Dr. Natale Milch. This showed normal uterine myometrium and endometrium with bilateral functional ovarian cysts. There were no worrisome features. The patient had a Pap  smear which was unremarkable on July 26th.  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 Her husband of 4 years, [name], works as a Administrator. In addition to them at home is the patient's son, [name], 7 years old attends [employer] in the tenth grade. [name], according to the patient, has significant nausea problems as indeed the patient did previously. The patient attends a local [address]. [name] is pastor.    ADVANCED DIRECTIVES:  HEALTH MAINTENANCE: History  Substance Use Topics  . Smoking status: Current Some Day Smoker -- 2.0 packs/day for 20 years    Types: Cigarettes    Last Attempt to Quit: 11/01/2010  . Smokeless tobacco: Never Used  . Alcohol Use: No     quit drinking 6 months     Colonoscopy:  PAP:  Bone density:  Lipid panel:  Allergies  Allergen Reactions  . Phenytoin Nausea And Vomiting  . ZOX:WRUEAVWUJWJ+XBJYNWGNF+AOZHYQMVHQ Acid+Aspartame Other (See Comments)    faint  . Thorazine (Chlorpromazine Hcl) Hives  . Augmentin Other (See Comments)    Syncope  . Ciprofloxacin Nausea And Vomiting  . Vancomycin Rash    Possible rash reported by MD    Current Outpatient Prescriptions  Medication Sig Dispense Refill  . ALPRAZolam (XANAX) 0.5 MG tablet Take one of two pills three times daily as needed for anxiety  180 tablet  4  . cholecalciferol (VITAMIN D) 1000 UNITS tablet Take 1,000 Units by mouth daily.      . citalopram (CELEXA) 20 MG tablet Take 1 tablet (20 mg total) by mouth daily.  30 tablet  0  . emollient (BIAFINE) cream Apply topically as needed.      . lidocaine-prilocaine (EMLA) cream Apply topically as needed. Apply to port as needed      . Multiple Vitamins-Minerals (MULTIVITAMIN WITH MINERALS) tablet Take 1 tablet by mouth daily.      . polyethylene glycol (MIRALAX / GLYCOLAX) packet Take 17 g by mouth daily. constipation       . promethazine (PHENERGAN) 25 MG  tablet Take 25 mg by mouth every 6 (six) hours as needed. nausea      . ranitidine (ZANTAC) 150 MG capsule Take 150 mg by mouth 2 (two) times daily as needed. For heartburn       No current facility-administered medications for this visit.   Facility-Administered Medications Ordered in Other Visits  Medication Dose Route Frequency Provider Last Rate Last Dose  . 0.9 %  sodium chloride infusion   Intravenous Once Catalina Gravel, PA      . acetaminophen (TYLENOL) tablet 650 mg  650 mg Oral Once Catalina Gravel, PA   650 mg at 08/24/11 1452  . diphenhydrAMINE (BENADRYL) capsule 25 mg  25 mg Oral Once Lowella Dell, MD   25 mg at 08/24/11 1452  . heparin lock flush 100 unit/mL  500 Units Intracatheter Once PRN Hayven Croy Allegra Grana, PA      . sodium chloride 0.9 % injection 10 mL  10 mL Intracatheter PRN Maryln Eastham Allegra Grana, PA      . trastuzumab (HERCEPTIN) 336 mg in sodium chloride 0.9 % 250 mL chemo infusion  6 mg/kg (Order-Specific) Intravenous Once Catalina Gravel, PA 532 mL/hr at 08/24/11 1538 336 mg at 08/24/11 1538    OBJECTIVE: Filed Vitals:   08/24/11 1328  BP: 116/74  Pulse: 81  Temp: 97.9 F (36.6 C)     Body mass index is 19.08 kg/(m^2).    ECOG FS: 1  Physical Exam: HEENT:  Sclerae anicteric, conjunctivae pink.  Oropharynx clear.  No mucositis or candidiasis.   Nodes:  No cervical, supraclavicular, or axillary lymphadenopathy palpated.  Breast Exam: Patient is status post bilateral mastectomies. Port is intact in the left upper chest wall with no erythema, edema, or evidence of infection. Lungs:  Clear to auscultation bilaterally.  No crackles, rhonchi, or wheezes.   Heart:  Regular rate and rhythm.   Abdomen:  Soft, nontender.  Positive bowel sounds.  No organomegaly or masses palpated.   Musculoskeletal:  No focal spinal tenderness to palpation.  Extremities:  Benign.  No peripheral edema or cyanosis.   Skin:  Benign.   Neuro:  Nonfocal. Alert and oriented x3 with pleasant affect.    LAB  RESULTS: Lab Results  Component Value Date   WBC 5.9 08/24/2011   NEUTROABS 4.1 08/24/2011   HGB 13.6 08/24/2011   HCT 38.3 08/24/2011   MCV 91.2 08/24/2011   PLT 181 08/24/2011      Chemistry      Component Value Date/Time   NA 139 07/15/2011 0526   K 3.8 07/15/2011 0526   CL 104 07/15/2011 0526   CO2 29 07/15/2011 0526  BUN 7 07/15/2011 0526   CREATININE 0.81 07/15/2011 0526      Component Value Date/Time   CALCIUM 9.0 07/15/2011 0526   ALKPHOS 94 07/15/2011 0526   AST 22 07/15/2011 0526   ALT 44* 07/15/2011 0526   BILITOT 0.2* 07/15/2011 0526       Lab Results  Component Value Date   LABCA2 18 06/09/2011     STUDIES: No new results found.  ASSESSMENT: 36 year old [place] woman, known to be BRCA1 and 2 negative,   (1) status post bilateral mastectomies May of 2012 for a right-sided T1c N1 (stage II) grade 3 invasive ductal carcinoma,estrogen and progesterone receptor positive, HER-2 amplified, with an MIB-1-1 of 85%.   (2) status post carboplatin/docetaxol/trastuzumab x6, completed mid December 2012   (3) continuing on trastuzumab every 3 weeks .   (4) status post radiation therapy, completed 07/12/2011  (5)  Patient was prescribed tamoxifen, 20 mg daily, in March of 2013, but has not yet begun that medication.   PLAN:  Mirah and I spent quite a bit of time today again reviewing her prognosis as originally documented by Dr. [name] that her first visit. We again discussed possible benefits and side effects associated with tamoxifen. She would like to think about a while longer before beginning that medication.  In the meanwhile, she will continue with trastuzumab every 3 weeks, her next dose scheduled for today. We are rescheduling her appointment with Dr. Michell Heinrich for routine followup. She'll have an echocardiogram and be seen by Dr. Gala Romney in early May to assess cardiac function, and will see me again on May 15 for followup prior to her next dose of  trastuzumab.  Patient voices understanding and agreement with our plan. She will call any changes or problems.    Jayliah Benett    08/24/2011

## 2011-08-24 NOTE — Telephone Encounter (Signed)
Per staff message I have scheduled appts for patient and gave her a schedule.  JMW

## 2011-08-25 ENCOUNTER — Telehealth: Payer: Self-pay | Admitting: Oncology

## 2011-08-25 NOTE — Telephone Encounter (Signed)
gve the pt her may,june 2013 appts along with the rad onc appt and the appt with dr Clarise Cruz

## 2011-08-31 ENCOUNTER — Ambulatory Visit (HOSPITAL_COMMUNITY)
Admission: RE | Admit: 2011-08-31 | Discharge: 2011-08-31 | Disposition: A | Discharge status: Home / Self Care | Payer: Self-pay | Source: Ambulatory Visit | Attending: Physician Assistant | Admitting: Physician Assistant

## 2011-08-31 DIAGNOSIS — I519 Heart disease, unspecified: Secondary | ICD-10-CM

## 2011-08-31 DIAGNOSIS — C50919 Malignant neoplasm of unspecified site of unspecified female breast: Secondary | ICD-10-CM | POA: Insufficient documentation

## 2011-08-31 NOTE — Progress Notes (Signed)
  Echocardiogram 2D Echocardiogram has been performed.  Vale Peraza L 08/31/2011, 9:42 AM

## 2011-09-01 ENCOUNTER — Ambulatory Visit: Admission: RE | Admit: 2011-09-01 | Payer: Self-pay | Source: Ambulatory Visit | Admitting: Radiation Oncology

## 2011-09-08 ENCOUNTER — Ambulatory Visit (HOSPITAL_COMMUNITY)
Admission: RE | Admit: 2011-09-08 | Discharge: 2011-09-08 | Disposition: A | Discharge status: Home / Self Care | Payer: Self-pay | Source: Ambulatory Visit | Attending: Internal Medicine | Admitting: Internal Medicine

## 2011-09-08 ENCOUNTER — Encounter (HOSPITAL_COMMUNITY): Payer: Self-pay

## 2011-09-08 VITALS — BP 84/62 | HR 82 | Ht 64.0 in | Wt 113.8 lb

## 2011-09-08 DIAGNOSIS — I428 Other cardiomyopathies: Secondary | ICD-10-CM | POA: Insufficient documentation

## 2011-09-08 DIAGNOSIS — R55 Syncope and collapse: Secondary | ICD-10-CM | POA: Insufficient documentation

## 2011-09-08 DIAGNOSIS — I429 Cardiomyopathy, unspecified: Secondary | ICD-10-CM

## 2011-09-08 DIAGNOSIS — R197 Diarrhea, unspecified: Secondary | ICD-10-CM | POA: Insufficient documentation

## 2011-09-08 DIAGNOSIS — Z888 Allergy status to other drugs, medicaments and biological substances status: Secondary | ICD-10-CM | POA: Insufficient documentation

## 2011-09-08 DIAGNOSIS — T451X5A Adverse effect of antineoplastic and immunosuppressive drugs, initial encounter: Secondary | ICD-10-CM

## 2011-09-08 DIAGNOSIS — C50919 Malignant neoplasm of unspecified site of unspecified female breast: Secondary | ICD-10-CM | POA: Insufficient documentation

## 2011-09-08 DIAGNOSIS — Z79899 Other long term (current) drug therapy: Secondary | ICD-10-CM | POA: Insufficient documentation

## 2011-09-08 DIAGNOSIS — R5381 Other malaise: Secondary | ICD-10-CM | POA: Insufficient documentation

## 2011-09-08 DIAGNOSIS — R569 Unspecified convulsions: Secondary | ICD-10-CM | POA: Insufficient documentation

## 2011-09-08 DIAGNOSIS — F17200 Nicotine dependence, unspecified, uncomplicated: Secondary | ICD-10-CM | POA: Insufficient documentation

## 2011-09-08 DIAGNOSIS — Z9221 Personal history of antineoplastic chemotherapy: Secondary | ICD-10-CM | POA: Insufficient documentation

## 2011-09-08 DIAGNOSIS — F411 Generalized anxiety disorder: Secondary | ICD-10-CM | POA: Insufficient documentation

## 2011-09-08 DIAGNOSIS — I427 Cardiomyopathy due to drug and external agent: Secondary | ICD-10-CM | POA: Insufficient documentation

## 2011-09-08 DIAGNOSIS — Z8614 Personal history of Methicillin resistant Staphylococcus aureus infection: Secondary | ICD-10-CM | POA: Insufficient documentation

## 2011-09-08 DIAGNOSIS — Z9010 Acquired absence of unspecified breast and nipple: Secondary | ICD-10-CM | POA: Insufficient documentation

## 2011-09-08 DIAGNOSIS — I498 Other specified cardiac arrhythmias: Secondary | ICD-10-CM | POA: Insufficient documentation

## 2011-09-08 MED ORDER — CARVEDILOL 3.125 MG PO TABS: 3.1250 mg | ORAL_TABLET | Freq: Two times a day (BID) | ORAL | Status: DC

## 2011-09-08 NOTE — Assessment & Plan Note (Signed)
I reviewed all her echos. She has clear evidence of a new onset cardiomyopathy likely related to herceptin. Reduction in LV function is mild with EF 45-50% with no overt HF. Will stop herceptin for now and start low-dose carvedilol. Will follow closely to titrate.

## 2011-09-08 NOTE — Assessment & Plan Note (Signed)
Suspect she may have a component of neurocardiogenic syncope. Will have her liberalize her salt and water intake being careful not to put her into HF.

## 2011-09-08 NOTE — Progress Notes (Signed)
Encounter addended by: Noralee Space, RN on: 09/08/2011 12:19 PM<BR>     Documentation filed: Patient Instructions Section, Orders

## 2011-09-08 NOTE — Assessment & Plan Note (Signed)
Suspect she may have a component of IBS. Consider GI work-up.

## 2011-09-08 NOTE — Patient Instructions (Signed)
Start Carvedilol 3.125 mg Twice daily   Your physician recommends that you schedule a follow-up appointment in: 3 weeks

## 2011-09-08 NOTE — Progress Notes (Signed)
Referring Physician: Dr. [name] Primary Care: Dr. Warner Mccreedy Primary Cardiologist:  none   HPI: Crystal [name] is a 36 y.o. female with history of seizures (2 episodes with last one ~4 yrs ago), sinus tach and BRCA1 stage II invasive ductal carcinoma, ER/PR/HER-2 positive.  Status post bilateral mastectomy with right axillary lymph node dissection by Dr. Carolynne Edouard on 11/16/10.  She is status post carboplatin/docetaxol/trastuzumab x6, completed mid December 2012.  She is continuing on trastuzumab every 3 weeks for a total of 1 year.  She has also began radiation therapy and will begin antiestrogen therapy once radiation has been completed.  She has been having lymphedema in the right upper extremity and is being seen in the lymphedema clinic.    Echo 03/03/11: EF 60-65%, lateral S' >16 (top cut off) Echo 06/28/11 EF 50% s' 11.8 Echo 08/31/11 EF 45-50% s' 10.2  Here for f/u. Says she has been very dizzy and presyncopal. Says she has a h/o syncope and they were considering a tilt table test.   Having multiple bouts of diarrhea up to 10 bouts/day. Some formed and some watery. Thinks it might be related to celexa. Occasional ab pain. Stopped Herceptin for one dose after having seizure. Last dose of Herceptin was 2 weeks ago. Feels weak. More short of breath with exertion. No LE edema. Keeps losing weight. Now down 40 pounds.    Review of Systems: [y] = yes, [ ]  = no   General: Weight gain Cove.Etienne ]; Weight loss Cove.Etienne ]; Anorexia [ ] ; Fatigue [ ] ; Fever [ ] ; Chills [ ] ; Weakness [ ]   Cardiac: Chest pain/pressure Cove.Etienne ]; Resting SOB [ ] ; Exertional SOB Cove.Etienne ]; Orthopnea [ ] ; Pedal Edema [ ] ; Palpitations Cove.Etienne ]; Syncope [ ] ; Presyncope [ ] ; Paroxysmal nocturnal dyspnea[ ]   Pulmonary: Cough [ ] ; Wheezing[ ] ; Hemoptysis[ ] ; Sputum [ ] ; Snoring [ ]   GI: Vomiting[ ] ; Dysphagia[ ] ; Melena[ ] ; Hematochezia [ ] ; Heartburn[ ] ; Abdominal pain [ ] ; Constipation [ ] ; Diarrhea [ ] ; BRBPR [ ]   GU: Hematuria[ ] ; Dysuria [ ] ;  Nocturia[ ]   Vascular: Pain in legs with walking [ ] ; Pain in feet with lying flat [ ] ; Non-healing sores [ ] ; Stroke [ ] ; TIA [ ] ; Slurred speech [ ] ;  Neuro: Headaches[ ] ; Vertigo[ ] ; Seizures[y ]; Paresthesias[ ] ;Blurred vision [ ] ; Diplopia [ ] ; Vision changes [ ]   Ortho/Skin: Arthritis [ ] ; Joint pain [ ] ; Muscle pain [ ] ; Joint swelling [ ] ; Back Pain [ ] ; Rash [ ]   Psych: Depression[ ] ; Anxiety[y ]  Heme: Bleeding problems [ ] ; Clotting disorders [ ] ; Anemia [ ]   Endocrine: Diabetes [ ] ; Thyroid dysfunction[ ]    Past Medical History  Diagnosis Date  . Allergy   . Right breast ca dx'd 10/2010  . Breast cancer 12/03/10  . Status post chemotherapy     docetaxel/carboplatin/trastuzumab  . Anxiety   . Seizures     last seizure 2 years ago, diagnosed 3 years ago  . Hx MRSA infection 03/28/11    Skin    Current Outpatient Prescriptions  Medication Sig Dispense Refill  . ALPRAZolam (XANAX) 0.5 MG tablet Take one of two pills three times daily as needed for anxiety  180 tablet  4  . cetirizine (ZYRTEC) 10 MG tablet Take 10 mg by mouth daily.      . cholecalciferol (VITAMIN D) 1000 UNITS tablet Take 1,000 Units by mouth daily.      . citalopram (CELEXA) 20  MG tablet Take 1 tablet (20 mg total) by mouth daily.  30 tablet  0  . diphenhydrAMINE (BENADRYL ALLERGY) 25 MG tablet Take 25 mg by mouth as needed.      . Multiple Vitamins-Minerals (MULTIVITAMIN WITH MINERALS) tablet Take 1 tablet by mouth daily.      . naproxen sodium (ANAPROX) 220 MG tablet Take 220 mg by mouth 2 (two) times daily with a meal.      . polyethylene glycol (MIRALAX / GLYCOLAX) packet Take 17 g by mouth daily. constipation       . promethazine (PHENERGAN) 25 MG tablet Take 25 mg by mouth every 6 (six) hours as needed. nausea      . ranitidine (ZANTAC) 150 MG capsule Take 150 mg by mouth 2 (two) times daily as needed. For heartburn      . emollient (BIAFINE) cream Apply topically as needed.      .  lidocaine-prilocaine (EMLA) cream Apply topically as needed. Apply to port as needed        Allergies  Allergen Reactions  . Phenytoin Nausea And Vomiting  . Amoxicillin-Pot Clavulanate Other (See Comments)    faint  . Thorazine (Chlorpromazine Hcl) Hives  . Amoxicillin-Pot Clavulanate Other (See Comments)    Syncope  . Ciprofloxacin Nausea And Vomiting  . Vancomycin Rash    Possible rash reported by MD    History   Social History  . Marital Status: Married    Spouse Name: N/A    Number of Children: N/A  . Years of Education: N/A   Occupational History  . Not on file.   Social History Main Topics  . Smoking status: Current Some Day Smoker -- 2.0 packs/day for 20 years    Types: Cigarettes    Last Attempt to Quit: 11/01/2010  . Smokeless tobacco: Never Used  . Alcohol Use: No     quit drinking 6 months  . Drug Use: No  . Sexually Active: Yes -- Female partner(s)    Birth Control/ Protection: None     advised not to use methods containing hormones   Other Topics Concern  . Not on file   Social History Narrative   Married 4 years1 son age 16UnemployedGrandfather died unknown malignacyNo family h/o breast, ovarian or colon caMenarche age 56    Family History  Problem Relation Age of Onset  . Cancer Maternal Grandfather     PHYSICAL EXAM: Filed Vitals:   09/08/11 1109  BP: 84/62  Pulse: 82  Height: 5\' 4"  (1.626 m)  Weight: 113 lb 12.8 oz (51.619 kg)  SpO2: 100%   orthostatics  Sitting 104/68 110 Standing 106/70 115 (not dizzy)  General:  Thin No respiratory difficulty HEENT: normal Neck: supple. no JVD. Carotids 2+ bilat; no bruits. No lymphadenopathy or thryomegaly appreciated. Cor: PMI nondisplaced. regular rhythm. No rubs, gallops or murmurs. Lungs: clear Abdomen: soft, nontender, nondistended. No hepatosplenomegaly. No bruits or masses. Good bowel sounds. Extremities: no cyanosis, clubbing, rash, edema.  Neuro: alert & oriented x 3, cranial nerves  grossly intact. moves all 4 extremities w/o difficulty. Affect pleasant.   ASSESSMENT & PLAN:

## 2011-09-12 ENCOUNTER — Other Ambulatory Visit: Payer: Self-pay | Admitting: Oncology

## 2011-09-13 ENCOUNTER — Other Ambulatory Visit: Payer: Self-pay | Admitting: Physician Assistant

## 2011-09-13 DIAGNOSIS — C50919 Malignant neoplasm of unspecified site of unspecified female breast: Secondary | ICD-10-CM

## 2011-09-14 ENCOUNTER — Other Ambulatory Visit: Payer: Self-pay | Admitting: Lab

## 2011-09-14 ENCOUNTER — Ambulatory Visit: Payer: Self-pay

## 2011-09-14 ENCOUNTER — Telehealth: Payer: Self-pay | Admitting: Oncology

## 2011-09-14 ENCOUNTER — Ambulatory Visit (HOSPITAL_BASED_OUTPATIENT_CLINIC_OR_DEPARTMENT_OTHER): Payer: Self-pay | Admitting: Physician Assistant

## 2011-09-14 ENCOUNTER — Encounter: Payer: Self-pay | Admitting: Physician Assistant

## 2011-09-14 VITALS — BP 126/88 | HR 90 | Temp 98.1°F | Ht 64.0 in | Wt 114.2 lb

## 2011-09-14 DIAGNOSIS — F329 Major depressive disorder, single episode, unspecified: Secondary | ICD-10-CM

## 2011-09-14 DIAGNOSIS — R112 Nausea with vomiting, unspecified: Secondary | ICD-10-CM

## 2011-09-14 DIAGNOSIS — R197 Diarrhea, unspecified: Secondary | ICD-10-CM

## 2011-09-14 DIAGNOSIS — C50919 Malignant neoplasm of unspecified site of unspecified female breast: Secondary | ICD-10-CM

## 2011-09-14 LAB — COMPREHENSIVE METABOLIC PANEL
ALT: 19 U/L (ref 0–35)
AST: 23 U/L (ref 0–37)
Albumin: 3.9 g/dL (ref 3.5–5.2)
Alkaline Phosphatase: 120 U/L — ABNORMAL HIGH (ref 39–117)
BUN: 17 mg/dL (ref 6–23)
Calcium: 9.1 mg/dL (ref 8.4–10.5)
Chloride: 105 mEq/L (ref 96–112)
Potassium: 3.6 mEq/L (ref 3.5–5.3)
Sodium: 140 mEq/L (ref 135–145)

## 2011-09-14 LAB — CBC WITH DIFFERENTIAL/PLATELET
BASO%: 0.5 % (ref 0.0–2.0)
EOS%: 4 % (ref 0.0–7.0)
HCT: 38.9 % (ref 34.8–46.6)
MCH: 33.6 pg (ref 25.1–34.0)
MCHC: 34.6 g/dL (ref 31.5–36.0)
MONO#: 0.3 10*3/uL (ref 0.1–0.9)
NEUT%: 69 % (ref 38.4–76.8)
RBC: 4.01 10*6/uL (ref 3.70–5.45)
RDW: 14.5 % (ref 11.2–14.5)
WBC: 6 10*3/uL (ref 3.9–10.3)
lymph#: 1.3 10*3/uL (ref 0.9–3.3)
nRBC: 0 % (ref 0–0)

## 2011-09-14 MED ORDER — CITALOPRAM HYDROBROMIDE 20 MG PO TABS: 20.0000 mg | ORAL_TABLET | Freq: Every day | ORAL | Status: DC

## 2011-09-14 NOTE — Progress Notes (Signed)
ID: Crystal [name]   DOB: 12/15/1975  MR#: [id number]  OZH#:[id number]  HISTORY OF PRESENT ILLNESS: The patient is a 36 year old [place] woman who noted a mass in her right breast and brought it to her primary care physician's attention June of 2012. She was set up for mammography at [facility], where an area of calcifications highly suspicious for carcinoma was biopsied. This was in the upper outer quadrant and it measured 1.7 cm mammographically and 1.8 cm by ultrasonography. The pathology report ([id number]) showed a high-grade invasive ductal carcinoma estrogen receptor positive at 64% progesterone receptor positive at 18%, and HER-2 amplified with a ratio by CISH of 5.24. MIB-1-1 was 65%. A second mass biopsied at the same time was also triple positive.  Given her multiple masses in the right breast, mastectomy was recommended. The patient actually opted for bilateral mastectomies and this was performed together with a right axillary lymph node dissection under Dr. Felicity Pellegrini on 11/16/2010. The final pathology [id number]) showed, on the left, no evidence of cancer. On the right the patient had a high-grade invasive ductal carcinoma measuring 1.9 cm, with negative margins, grade 3, involving 2 of 16 lymph nodes (stage IIB).  Eliane was treated in the adjuvant setting, with 6 doses of docetaxel/carboplatin/trastuzumab, completed December 2012. She is status post radiation therapy.  She continues on trastuzumab every 3 weeks.  INTERVAL HISTORY:  Crystal [name] returns today accompanied by her husband, Royal Hawthorn, for followup of her right breast carcinoma.  She is due for her next every 3 week dose of trastuzumab.  Following our last appointment here in late April, Crystal [name] had an echocardiogram which in fact showed a slight reduction in left ventricular function, with an EF of 45-50%. This was evaluated by Dr.  Gala Romney, who recommended stopping Herceptin temporarily, and beginning on low-dose carvedilol.  Fortunately, Hisako is asymptomatic with no increased shortness of breath, orthopnea, PND, cough, or peripheral swelling.   Since her appointment here, Crystal [name] has also started on tamoxifen which she seems to tolerate well. She feels like she occasionally has some mood swings. She continues to have some depression anxiety and continues on Celexa with good tolerance. She denies suicidal ideations.   REVIEW OF SYSTEMS: Crystal [name]'s energy level is fair. She has had no illnesses and denies fevers, chills, or night sweats. She is eating well with no nausea. She continues, however to have problems with her bowel movements. She tends to be very constipated, or have excessive diarrhea, and she alternates between the 2. Most recently she has had an increased number of loose bowel movements, and is trying to keep herself well hydrated. No chest pain, cough, or increased shortness of breath.  No unusual headaches. She does feel little dizzy and lightheaded at times, but has had no loss of consciousness and denies any seizure activity. No unusual myalgias, arthralgias, or bony pain.   A detailed review of systems is otherwise noncontributory as noted below.  PAST MEDICAL HISTORY: Past Medical History  Diagnosis Date  . Allergy   . Right breast ca dx'd 10/2010  . Breast cancer 12/03/10  . Status post chemotherapy     docetaxel/carboplatin/trastuzumab  . Anxiety   . Seizures     last seizure 2 years ago, diagnosed 3 years ago  . Hx MRSA infection 03/28/11    Skin  1. History of seizures. The patient has been thoroughly evaluated both here by Dr. [name] and at [facility] for this. Among many studies, she had an MRI of the  brain on August 28th. This showed an incidental solitary small lesion in the left frontoparietal white matter. It was not felt to be suggestive of multiple sclerosis. A noncontrasted CT in August 2010 was negative. The patient has been off all seizure medications now for 2 months. There  has been no evidence of seizure recurrence. 2. Complex psychology history (please refer to Dr. [name] note in the E-chart from August 29, 2009) with evidence of anxiety disorder, depression and psychosis. The patient is currently on no medications for this and denies any of these symptoms at present. 3. History of migraines. 4. Remote history of multidrug abuse. 5. Status post wisdom teeth extraction.  History of MRSA skin infection   PAST SURGICAL HISTORY: Past Surgical History  Procedure Date  . Wisdom tooth extraction   . Breast surgery 11/16/2010    bilateral mastectomy+Ralnd,T1cN1a, Her2+,ERPR+  . Biopsy breast 10/14/2010    right needle core biopsy  . Portacath placement 11/16/2010    placement of left subclavian port    FAMILY HISTORY Family History  Problem Relation Age of Onset  . Cancer Maternal Grandfather   The patient's mother is living, in her mid 71s, with no history of breast or ovarian cancer. The patient does not know her biological father. The patient has an older brother with bipolar disease, and according to the patient, borderline schizophrenia.   GYNECOLOGIC HISTORY: Menarche at age 65. She is G1, P1 with first pregnancy to term at age 67. The patient had significant pelvic pain recently and was evaluated with transvaginal and pelvic ultrasound on July 17th by Dr. Natale Milch. This showed normal uterine myometrium and endometrium with bilateral functional ovarian cysts. There were no worrisome features. The patient had a Pap smear which was unremarkable on July 26th.  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 Her husband of 4 years, [name], works as a Administrator. In addition to them at home is the patient's son, Crystal [name], 40 years old attends [employer] in the tenth grade. Crystal [name], according to the patient, has significant nausea problems as indeed the patient did previously. The patient attends a  local [address]. [name] is pastor.    ADVANCED DIRECTIVES:  HEALTH MAINTENANCE: History  Substance Use Topics  . Smoking status: Current Some Day Smoker -- 2.0 packs/day for 20 years    Types: Cigarettes    Last Attempt to Quit: 11/01/2010  . Smokeless tobacco: Never Used  . Alcohol Use: No     quit drinking 6 months     Colonoscopy:  PAP:  Bone density:  Lipid panel:  Allergies  Allergen Reactions  . Phenytoin Nausea And Vomiting  . Amoxicillin-Pot Clavulanate Other (See Comments)    faint  . Thorazine (Chlorpromazine Hcl) Hives  . Amoxicillin-Pot Clavulanate Other (See Comments)    Syncope  . Ciprofloxacin Nausea And Vomiting  . Vancomycin Rash    Possible rash reported by MD    Current Outpatient Prescriptions  Medication Sig Dispense Refill  . ALPRAZolam (XANAX) 0.5 MG tablet Take one of two pills three times daily as needed for anxiety  180 tablet  4  . carvedilol (COREG) 3.125 MG tablet Take 1 tablet (3.125 mg total) by mouth 2 (two) times daily with a meal.  60 tablet  6  . cetirizine (ZYRTEC) 10 MG tablet Take 10 mg by mouth daily.      . cholecalciferol (VITAMIN D) 1000 UNITS  tablet Take 2,500 Units by mouth daily.       . citalopram (CELEXA) 20 MG tablet Take 1 tablet (20 mg total) by mouth daily.  30 tablet  1  . diphenhydrAMINE (BENADRYL ALLERGY) 25 MG tablet Take 25-50 mg by mouth at bedtime as needed.       . Multiple Vitamins-Minerals (MULTIVITAMIN WITH MINERALS) tablet Take 1 tablet by mouth daily. (special formulation 2 capsules BID)      . naproxen sodium (ANAPROX) 220 MG tablet Take 220 mg by mouth 2 (two) times daily with a meal.      . polyethylene glycol (MIRALAX / GLYCOLAX) packet Take 17 g by mouth daily as needed. constipation      . promethazine (PHENERGAN) 25 MG tablet Take 25 mg by mouth every 6 (six) hours as needed. nausea      . ranitidine (ZANTAC) 150 MG capsule Take 150 mg by mouth 2 (two) times daily as needed. For heartburn       . tamoxifen (NOLVADEX) 20 MG tablet Take 20 mg by mouth daily.      . Trastuzumab (HERCEPTIN IV) Inject into the vein. Q3 weeks      . DISCONTD: citalopram (CELEXA) 20 MG tablet Take 1 tablet (20 mg total) by mouth daily.  30 tablet  0    OBJECTIVE: Filed Vitals:   09/14/11 0923  BP: 126/88  Pulse: 90  Temp: 98.1 F (36.7 C)     Body mass index is 19.60 kg/(m^2).    ECOG FS: 1  Filed Weights   09/14/11 0923  Weight: 114 lb 3.2 oz (51.801 kg)   Physical Exam: HEENT:  Sclerae anicteric, conjunctivae pink.  Oropharynx clear.   Nodes:  No cervical, supraclavicular, or axillary lymphadenopathy palpated.  Breast Exam: Deferred Lungs:  Clear to auscultation bilaterally.  No crackles, rhonchi, or wheezes.   Heart:  Regular rate and rhythm.   Abdomen:  Soft, thin, nontender.  Positive bowel sounds.  No organomegaly or masses palpated.   Musculoskeletal:  No focal spinal tenderness to palpation.  Extremities:  Benign.  No peripheral edema or cyanosis.   Skin:  Benign.   Neuro:  Nonfocal. Alert and oriented x3 with pleasant affect.    LAB RESULTS: Lab Results  Component Value Date   WBC 6.0 09/14/2011   NEUTROABS 4.1 09/14/2011   HGB 13.5 09/14/2011   HCT 38.9 09/14/2011   MCV 97.0 09/14/2011   PLT 164 09/14/2011      Chemistry      Component Value Date/Time   NA 140 08/24/2011 1305   K 3.4* 08/24/2011 1305   CL 108 08/24/2011 1305   CO2 24 08/24/2011 1305   BUN 14 08/24/2011 1305   CREATININE 0.94 08/24/2011 1305      Component Value Date/Time   CALCIUM 8.3* 08/24/2011 1305   ALKPHOS 101 08/24/2011 1305   AST 20 08/24/2011 1305   ALT 18 08/24/2011 1305   BILITOT 0.2* 08/24/2011 1305       Lab Results  Component Value Date   LABCA2 16 08/24/2011     STUDIES:  08/31/2011 Transthoracic Echocardiography  Patient: Unnamed, Hino MR #: [id number] Study Date: 08/31/2011 Gender: F Age: 4 Height: 162.6cm Weight: 51.8kg BSA: 1.41m^2 Pt. Status: Room:  PERFORMING  [place], [facility], Nyeli Holtmeyer SONOGRAPHER Melissa Morford, RDCS cc:  ------------------------------------------------------------ LV EF: 50% - 55%  ------------------------------------------------------------ Indications: V58.11 Chemotherapy Evaluation.  ------------------------------------------------------------ History: PMH: No prior cardiac history.  ------------------------------------------------------------ Study Conclusions  - Left  ventricle: The cavity size was normal. Wall thickness was normal. Systolic function was low normal mildly reduced. The estimated ejection fraction was 50%. Wall motion was normal; there were no regional wall motion abnormalities. Left ventricular diastolic function parameters were normal. - Aortic valve: There was no stenosis. - Mitral valve: Trivial regurgitation. - Right ventricle: The cavity size was normal. Systolic function was normal. - Tricuspid valve: Peak RV-RA gradient: 24mm Hg (S). - Pulmonary arteries: PA peak pressure: 29mm Hg (S). - Inferior vena cava: The vessel was normal in size; the respirophasic diameter changes were in the normal range (= 50%); findings are consistent with normal central venous pressure. Impressions:  - Normal LV size with low normal to mildly reduced systolic function, EF 50%. Normal diastolic function. Normal RV size and systolic function. Normal atria, no significant valvular abnormalities. Transthoracic echocardiography. M-mode, complete 2D, spectral Doppler, and color Doppler. Height: Height: 162.6cm. Height: 64in. Weight: Weight: 51.8kg. Weight: 114lb. Body mass index: BMI: 19.6kg/m^2. Body surface area: BSA: 1.40m^2. Blood pressure: 110/66. Patient status: Outpatient. Location: Echo laboratory.     ASSESSMENT: 37 year old [place] woman, known to be BRCA1 and 2 negative,   (1) status post bilateral mastectomies May of 2012 for a right-sided T1c N1 (stage II) grade 3 invasive  ductal carcinoma,estrogen and progesterone receptor positive, HER-2 amplified, with an MIB-1-1 of 85%.   (2) status post carboplatin/docetaxol/trastuzumab x6, completed mid December 2012   (3) continuing on trastuzumab every 3 weeks .   (4) status post radiation therapy, completed 07/12/2011  (5)  On tamoxifen, 20 mg daily, since late April 2013   PLAN:  This case was reviewed with Dr. [name] and per Dr. Prescott Gum recommendation, we will hold Denasia's trastuzumab at this time. She scheduled to followup with Dr. Gala Romney on May 29, and will see Dr. [name] the following week to discuss her treatment plan.  In the meanwhile, she will continue on her tamoxifen which she actually seems to be tolerating well. We are also refilling her Celexa today.  With regards to Chauncey's GI issues, I am referring her to [facility] for further evaluation of possible IBS with associated diarrhea, and occasional constipation. She would like to try some probiotics, and she can certainly try that between now and her visit in their office. I have asked her, however, to discontinue the probiotics if her symptoms worsen.  Patient voices understanding and agreement with our plan. She will call any changes or problems.    Vanisha Whiten    09/14/2011

## 2011-09-14 NOTE — Telephone Encounter (Signed)
gve the pt her appt with dr stark at [facility] for June.

## 2011-09-15 ENCOUNTER — Ambulatory Visit
Admission: RE | Admit: 2011-09-15 | Discharge: 2011-09-15 | Disposition: A | Discharge status: Home / Self Care | Payer: Self-pay | Source: Ambulatory Visit | Attending: Radiation Oncology | Admitting: Radiation Oncology

## 2011-09-15 ENCOUNTER — Encounter: Payer: Self-pay | Admitting: Oncology

## 2011-09-15 ENCOUNTER — Encounter: Payer: Self-pay | Admitting: Radiation Oncology

## 2011-09-15 VITALS — BP 120/83 | HR 91 | Temp 97.1°F | Resp 18 | Wt 114.5 lb

## 2011-09-15 DIAGNOSIS — C50919 Malignant neoplasm of unspecified site of unspecified female breast: Secondary | ICD-10-CM

## 2011-09-15 NOTE — Progress Notes (Signed)
Patient came by today to bring information needed to fill out application for EPP financial assistance. I will process application and send letter to patient.

## 2011-09-15 NOTE — Progress Notes (Signed)
Department of Radiation Oncology  Phone:  [phone] Fax:        [phone]   Name: Crystal [name]   DOB: July 12, 1975  MRN: [id number]    Date: 09/15/2011  Follow Up Visit Note  CC: COPLAND,JESSICA, MD  Diagnosis: Right breast cancer  Interval since last radiation: 2 months  Allergies:  Allergies  Allergen Reactions  . Phenytoin Nausea And Vomiting  . Amoxicillin-Pot Clavulanate Other (See Comments)    faint  . Thorazine (Chlorpromazine Hcl) Hives  . Amoxicillin-Pot Clavulanate Other (See Comments)    Syncope  . Ciprofloxacin Nausea And Vomiting  . Vancomycin Rash    Possible rash reported by MD    Medications:  Current Outpatient Prescriptions  Medication Sig Dispense Refill  . ALPRAZolam (XANAX) 0.5 MG tablet Take one of two pills three times daily as needed for anxiety  180 tablet  4  . carvedilol (COREG) 3.125 MG tablet Take 1 tablet (3.125 mg total) by mouth 2 (two) times daily with a meal.  60 tablet  6  . cetirizine (ZYRTEC) 10 MG tablet Take 10 mg by mouth daily.      . cholecalciferol (VITAMIN D) 1000 UNITS tablet Take 2,500 Units by mouth daily.       . citalopram (CELEXA) 20 MG tablet Take 1 tablet (20 mg total) by mouth daily.  30 tablet  1  . diphenhydrAMINE (BENADRYL ALLERGY) 25 MG tablet Take 25-50 mg by mouth at bedtime as needed.       . Multiple Vitamins-Minerals (MULTIVITAMIN WITH MINERALS) tablet Take 1 tablet by mouth daily. (special formulation 2 capsules BID)      . promethazine (PHENERGAN) 25 MG tablet Take 25 mg by mouth every 6 (six) hours as needed. nausea      . ranitidine (ZANTAC) 150 MG capsule Take 150 mg by mouth 2 (two) times daily as needed. For heartburn      . tamoxifen (NOLVADEX) 20 MG tablet Take 20 mg by mouth daily.      . naproxen sodium (ANAPROX) 220 MG tablet Take 220 mg by mouth 2 (two) times daily with a meal.      . polyethylene glycol (MIRALAX / GLYCOLAX) packet Take 17 g by mouth daily as needed. constipation        . Trastuzumab (HERCEPTIN IV) Inject into the vein. Q3 weeks        Interval History: Crystal [name] presents today for routine followup.  She rescheduled her last several appointments due to lots going on in her life per her report. She had several seizures. She's not supposed to be driving but continues to drive. Her Herceptin was stopped due to cardiac consultations. She's been having copious amount of diarrhea and his been referred to gastroenterology regarding this. She still has a significant amount of anxiety and feels at any moment she gets now. She still does not want to be followed by psychiatry. She is interested in other medications to help regulate her moods. She's been talking with our chaplain over the phone. She also has applied for disability and was interested in my thoughts regarding this in terms of her breast cancer.  Physical Exam:   weight is 114 lb 8 oz (51.937 kg). Her oral temperature is 97.1 F (36.2 C). Her blood pressure is 120/83 and her pulse is 91. Her respiration is 18.  Her right chest wall is well-healed. She has good range of motion of her right arm.  IMPRESSION: Right breast cancer status post radiation  with resolving acute effects of treatment  PLAN:  Crystal [name] is a 36 y.o. female is healed up well from radiation. It seems that her cancer is finally under control that she continues to struggle with psychosocial issues. I encouraged her to continue her on the phone sessions with our chaplain. I encouraged her to find some release in her life where she felt like she could be filled up rather than pouring herself and her family. She is going to have her diarrhea worked up by gastroenterology. I told her I did not think it was necessary were appropriate to declare her disabled due to her breast cancer. I again encouraged her to followup with psychiatry as I think there is a lot they could offer in terms of mood stabilizers. I told her I do not feel comfortable prescribing  anything at this time. I also encouraged her to followup with her primary care physician. I think it's normal for her to be tearful and have a lot of processing going on.  I will see her back in 6 months. She knows to contact us with any questions in the interim. She has thought about reconstruction but just doesn't feel that now is the right time.    [name], MD

## 2011-09-15 NOTE — Progress Notes (Signed)
Patient presents to the clinic today unaccompanied for a follow up appointment with Dr. Michell Heinrich. Patient is alert and oriented to person, place, and time. No distress noted. Steady gait noted. Pleasant affect noted. Patient denies pain at this time. Also, patient denies breast pain. Patient reports during the middle of radiation she would experience a quick flash of light that faded to white but has not experience this in three weeks. Patient reports Herceptin has been stopped due to heart complications. Patient reports that since chemo started she has lost 40 pounds. Patient reports she has a great appetite but, experience frequent daily diarrhea. Patient reports Amy, PA is referring her to a GI specialist in June. Patient continues to having daily slight headaches but, denies recent migraines. Patient reports nausea continues but has decreased in intensity. Patient denies emesis. Patient reports she has been taking Tamoxifen for almost a month now. Patient reports her last seizure activity was 07/13/2011. Reported all findings to Dr. Michell Heinrich.

## 2011-09-20 ENCOUNTER — Telehealth (INDEPENDENT_AMBULATORY_CARE_PROVIDER_SITE_OTHER): Payer: Self-pay | Admitting: General Surgery

## 2011-09-20 ENCOUNTER — Encounter: Payer: Self-pay | Admitting: Oncology

## 2011-09-20 NOTE — Progress Notes (Signed)
Patient approved for 100% discount EPP, for family of 2 income 18000.00

## 2011-09-20 NOTE — Telephone Encounter (Signed)
Spoke with patient and let her know that we are having to push back her apt time with Dr. Carolynne Edouard on 5/31 from 1:30 to 5:00pm.  Pt is aware and I also left a voicemail to remind her of this.

## 2011-09-25 ENCOUNTER — Ambulatory Visit: Payer: Self-pay | Admitting: Family Medicine

## 2011-09-25 VITALS — BP 118/84 | HR 79 | Temp 98.3°F | Resp 16 | Ht 66.5 in | Wt 116.2 lb

## 2011-09-25 DIAGNOSIS — S61409A Unspecified open wound of unspecified hand, initial encounter: Secondary | ICD-10-CM

## 2011-09-25 DIAGNOSIS — W5501XA Bitten by cat, initial encounter: Secondary | ICD-10-CM

## 2011-09-25 MED ORDER — DOXYCYCLINE HYCLATE 100 MG PO TABS: 100.0000 mg | ORAL_TABLET | Freq: Two times a day (BID) | ORAL | Status: DC

## 2011-09-25 MED ORDER — HYDROCODONE-ACETAMINOPHEN 5-500 MG PO CAPS: 1.0000 | ORAL_CAPSULE | Freq: Three times a day (TID) | ORAL | Status: DC | PRN

## 2011-09-25 NOTE — Progress Notes (Signed)
  Subjective:    Patient ID: Crystal [name], female    DOB: 18-Jan-1976, 36 y.o.   MRN: [id number]  HPI  Patient presents after cat bit (R) hand earlier today around 10 AM  Now (R) hand red and swollen  Currently being treated for breast cancer mastectomy 7/12 and completed chenotherapy with the  exception of herceptin as she was developing cardiotoxicity  Completed radiation therapy  MRSA hospitalization 2012 Review of Systems     Objective:   Physical Exam  Constitutional: She appears well-developed.  Neck: Neck supple.  Cardiovascular: Normal rate, regular rhythm and normal heart sounds.   Pulmonary/Chest: Effort normal and breath sounds normal.  Abdominal: Soft. Bowel sounds are normal.  Musculoskeletal:       Hands: Skin:       mulitple puncture sites; hand indurated          Assessment & Plan:   1. Cat bite  doxycycline (VIBRA-TABS) 100 MG tablet, hydrocodone-acetaminophen (LORCET-HD) 5-500 MG per capsule,    Anticipatory guidance

## 2011-09-26 ENCOUNTER — Encounter (HOSPITAL_COMMUNITY): Payer: Self-pay | Admitting: Anesthesiology

## 2011-09-26 ENCOUNTER — Emergency Department (HOSPITAL_COMMUNITY): Payer: Self-pay

## 2011-09-26 ENCOUNTER — Inpatient Hospital Stay (HOSPITAL_COMMUNITY)
Admission: EM | Admit: 2011-09-26 | Discharge: 2011-09-26 | DRG: 465 | Disposition: A | Discharge status: Home / Self Care | Payer: MEDICAID | Attending: Orthopedic Surgery | Admitting: Orthopedic Surgery

## 2011-09-26 ENCOUNTER — Emergency Department (HOSPITAL_COMMUNITY): Payer: Self-pay | Admitting: Anesthesiology

## 2011-09-26 ENCOUNTER — Encounter (HOSPITAL_COMMUNITY): Payer: Self-pay | Admitting: Orthopedic Surgery

## 2011-09-26 ENCOUNTER — Encounter (HOSPITAL_COMMUNITY)
Admission: EM | Disposition: A | Discharge status: Home / Self Care | Payer: Self-pay | Source: Home / Self Care | Attending: Orthopedic Surgery

## 2011-09-26 DIAGNOSIS — F17200 Nicotine dependence, unspecified, uncomplicated: Secondary | ICD-10-CM | POA: Diagnosis present

## 2011-09-26 DIAGNOSIS — F419 Anxiety disorder, unspecified: Secondary | ICD-10-CM

## 2011-09-26 DIAGNOSIS — Z853 Personal history of malignant neoplasm of breast: Secondary | ICD-10-CM

## 2011-09-26 DIAGNOSIS — L03119 Cellulitis of unspecified part of limb: Secondary | ICD-10-CM

## 2011-09-26 DIAGNOSIS — I509 Heart failure, unspecified: Secondary | ICD-10-CM

## 2011-09-26 DIAGNOSIS — W5501XA Bitten by cat, initial encounter: Secondary | ICD-10-CM

## 2011-09-26 DIAGNOSIS — IMO0001 Reserved for inherently not codable concepts without codable children: Secondary | ICD-10-CM | POA: Diagnosis present

## 2011-09-26 DIAGNOSIS — S61209A Unspecified open wound of unspecified finger without damage to nail, initial encounter: Principal | ICD-10-CM | POA: Diagnosis present

## 2011-09-26 DIAGNOSIS — L03113 Cellulitis of right upper limb: Secondary | ICD-10-CM

## 2011-09-26 DIAGNOSIS — Z8614 Personal history of Methicillin resistant Staphylococcus aureus infection: Secondary | ICD-10-CM

## 2011-09-26 DIAGNOSIS — Y92009 Unspecified place in unspecified non-institutional (private) residence as the place of occurrence of the external cause: Secondary | ICD-10-CM

## 2011-09-26 DIAGNOSIS — L02519 Cutaneous abscess of unspecified hand: Secondary | ICD-10-CM

## 2011-09-26 HISTORY — PX: I & D EXTREMITY: SHX5045

## 2011-09-26 LAB — CBC
HCT: 33.9 % — ABNORMAL LOW (ref 36.0–46.0)
MCHC: 34.8 g/dL (ref 30.0–36.0)
MCV: 95.5 fL (ref 78.0–100.0)
Platelets: 172 10*3/uL (ref 150–400)
RBC: 3.79 MIL/uL — ABNORMAL LOW (ref 3.87–5.11)
RDW: 13.7 % (ref 11.5–15.5)
RDW: 13.8 % (ref 11.5–15.5)
WBC: 10.8 10*3/uL — ABNORMAL HIGH (ref 4.0–10.5)

## 2011-09-26 LAB — GRAM STAIN

## 2011-09-26 LAB — BASIC METABOLIC PANEL
CO2: 22 mEq/L (ref 19–32)
Calcium: 8.4 mg/dL (ref 8.4–10.5)
Chloride: 105 mEq/L (ref 96–112)
GFR calc Af Amer: >90 mL/min (ref >90)
Sodium: 139 mEq/L (ref 135–145)

## 2011-09-26 SURGERY — IRRIGATION AND DEBRIDEMENT EXTREMITY
Anesthesia: General | Site: Hand | Laterality: Right | Wound class: Dirty or Infected

## 2011-09-26 MED ORDER — 0.9 % SODIUM CHLORIDE (POUR BTL) OPTIME
TOPICAL | Status: DC | PRN
  Administered 2011-09-26: 1000 mL

## 2011-09-26 MED ORDER — HYDROMORPHONE HCL PF 1 MG/ML IJ SOLN
1.0000 mg | Freq: Once | INTRAMUSCULAR | Status: AC
  Administered 2011-09-26: 1 mg via INTRAVENOUS
  Filled 2011-09-26: qty 1

## 2011-09-26 MED ORDER — CHLORHEXIDINE GLUCONATE 4 % EX LIQD: 60.0000 mL | Freq: Once | CUTANEOUS | Status: DC

## 2011-09-26 MED ORDER — ONDANSETRON HCL 4 MG PO TABS: 4.0000 mg | ORAL_TABLET | Freq: Four times a day (QID) | ORAL | Status: DC | PRN

## 2011-09-26 MED ORDER — LACTATED RINGERS IV SOLN
INTRAVENOUS | Status: DC
  Administered 2011-09-26: 1000 mL via INTRAVENOUS

## 2011-09-26 MED ORDER — ONDANSETRON HCL 4 MG/2ML IJ SOLN: 4.0000 mg | Freq: Four times a day (QID) | INTRAMUSCULAR | Status: DC | PRN

## 2011-09-26 MED ORDER — SODIUM CHLORIDE 0.9 % IV SOLN
3.0000 g | Freq: Three times a day (TID) | INTRAVENOUS | Status: DC
  Filled 2011-09-26: qty 3

## 2011-09-26 MED ORDER — FENTANYL CITRATE 0.05 MG/ML IJ SOLN
INTRAMUSCULAR | Status: AC
  Filled 2011-09-26: qty 2

## 2011-09-26 MED ORDER — HYDROMORPHONE HCL PF 1 MG/ML IJ SOLN
0.5000 mg | Freq: Once | INTRAMUSCULAR | Status: AC
  Administered 2011-09-26: 0.5 mg via INTRAVENOUS
  Filled 2011-09-26: qty 1

## 2011-09-26 MED ORDER — AMOXICILLIN-POT CLAVULANATE 875-125 MG PO TABS
1.0000 | ORAL_TABLET | Freq: Two times a day (BID) | ORAL | Status: DC
  Administered 2011-09-26: 1 via ORAL
  Filled 2011-09-26: qty 1

## 2011-09-26 MED ORDER — SODIUM CHLORIDE 0.9 % IV SOLN
3.0000 g | Freq: Four times a day (QID) | INTRAVENOUS | Status: DC
  Administered 2011-09-26 (×2): 3 g via INTRAVENOUS
  Filled 2011-09-26 (×3): qty 3

## 2011-09-26 MED ORDER — VITAMIN D3 25 MCG (1000 UNIT) PO TABS
2500.0000 [IU] | ORAL_TABLET | Freq: Every day | ORAL | Status: DC
  Administered 2011-09-26: 2500 [IU] via ORAL
  Filled 2011-09-26: qty 2.5

## 2011-09-26 MED ORDER — METOCLOPRAMIDE HCL 5 MG PO TABS
5.0000 mg | ORAL_TABLET | Freq: Three times a day (TID) | ORAL | Status: DC | PRN
  Filled 2011-09-26: qty 2

## 2011-09-26 MED ORDER — FENTANYL CITRATE 0.05 MG/ML IJ SOLN
25.0000 ug | INTRAMUSCULAR | Status: DC | PRN
  Administered 2011-09-26 (×2): 50 ug via INTRAVENOUS

## 2011-09-26 MED ORDER — SODIUM CHLORIDE 0.9 % IV SOLN
3.0000 g | Freq: Once | INTRAVENOUS | Status: AC
  Administered 2011-09-26: 3 g via INTRAVENOUS
  Filled 2011-09-26: qty 3

## 2011-09-26 MED ORDER — KETOROLAC TROMETHAMINE 30 MG/ML IJ SOLN: 15.0000 mg | Freq: Once | INTRAMUSCULAR | Status: DC | PRN

## 2011-09-26 MED ORDER — PROPOFOL 10 MG/ML IV BOLUS
INTRAVENOUS | Status: DC | PRN
  Administered 2011-09-26: 150 mg via INTRAVENOUS

## 2011-09-26 MED ORDER — POLYETHYLENE GLYCOL 3350 17 G PO PACK: 17.0000 g | PACK | Freq: Every day | ORAL | Status: DC | PRN

## 2011-09-26 MED ORDER — SODIUM CHLORIDE 0.9 % IR SOLN
Status: DC | PRN
  Administered 2011-09-26: 3000 mL

## 2011-09-26 MED ORDER — DIPHENHYDRAMINE HCL 25 MG PO TABS
25.0000 mg | ORAL_TABLET | Freq: Every evening | ORAL | Status: DC | PRN
  Filled 2011-09-26: qty 2

## 2011-09-26 MED ORDER — BUPIVACAINE HCL (PF) 0.25 % IJ SOLN
INTRAMUSCULAR | Status: DC | PRN
  Administered 2011-09-26: 10 mL

## 2011-09-26 MED ORDER — HYDROMORPHONE HCL PF 1 MG/ML IJ SOLN
0.5000 mg | INTRAMUSCULAR | Status: DC | PRN
  Administered 2011-09-26: 1 mg via INTRAVENOUS
  Filled 2011-09-26: qty 1

## 2011-09-26 MED ORDER — CITALOPRAM HYDROBROMIDE 20 MG PO TABS
20.0000 mg | ORAL_TABLET | Freq: Every day | ORAL | Status: DC
  Administered 2011-09-26: 20 mg via ORAL
  Filled 2011-09-26: qty 1

## 2011-09-26 MED ORDER — ONDANSETRON HCL 4 MG/2ML IJ SOLN
INTRAMUSCULAR | Status: DC | PRN
  Administered 2011-09-26 (×2): 2 mg via INTRAVENOUS

## 2011-09-26 MED ORDER — HYDROMORPHONE HCL PF 1 MG/ML IJ SOLN
INTRAMUSCULAR | Status: AC
  Filled 2011-09-26: qty 1

## 2011-09-26 MED ORDER — FAMOTIDINE 10 MG PO TABS
10.0000 mg | ORAL_TABLET | Freq: Two times a day (BID) | ORAL | Status: DC
  Administered 2011-09-26: 10 mg via ORAL
  Filled 2011-09-26 (×2): qty 1

## 2011-09-26 MED ORDER — BUPIVACAINE HCL (PF) 0.25 % IJ SOLN
INTRAMUSCULAR | Status: AC
  Filled 2011-09-26: qty 30

## 2011-09-26 MED ORDER — PROMETHAZINE HCL 25 MG/ML IJ SOLN: 6.2500 mg | INTRAMUSCULAR | Status: DC | PRN

## 2011-09-26 MED ORDER — MIDAZOLAM HCL 5 MG/5ML IJ SOLN
INTRAMUSCULAR | Status: DC | PRN
  Administered 2011-09-26: 2 mg via INTRAVENOUS
  Administered 2011-09-26 (×2): 1 mg via INTRAVENOUS

## 2011-09-26 MED ORDER — METOCLOPRAMIDE HCL 5 MG/ML IJ SOLN: 5.0000 mg | Freq: Three times a day (TID) | INTRAMUSCULAR | Status: DC | PRN

## 2011-09-26 MED ORDER — CARVEDILOL 3.125 MG PO TABS
3.1250 mg | ORAL_TABLET | Freq: Two times a day (BID) | ORAL | Status: DC
  Administered 2011-09-26: 3.125 mg via ORAL
  Filled 2011-09-26 (×3): qty 1

## 2011-09-26 MED ORDER — DEXTROSE 5 % IV SOLN
500.0000 mg | Freq: Four times a day (QID) | INTRAVENOUS | Status: DC | PRN
  Administered 2011-09-26: 500 mg via INTRAVENOUS
  Filled 2011-09-26 (×2): qty 5

## 2011-09-26 MED ORDER — AMOXICILLIN-POT CLAVULANATE 875-125 MG PO TABS: 1.0000 | ORAL_TABLET | Freq: Two times a day (BID) | ORAL | Status: AC

## 2011-09-26 MED ORDER — LACTATED RINGERS IV SOLN
INTRAVENOUS | Status: DC | PRN
  Administered 2011-09-26: 06:00:00 via INTRAVENOUS

## 2011-09-26 MED ORDER — METHOCARBAMOL 500 MG PO TABS: 500.0000 mg | ORAL_TABLET | Freq: Four times a day (QID) | ORAL | Status: DC | PRN

## 2011-09-26 MED ORDER — LORATADINE 10 MG PO TABS
10.0000 mg | ORAL_TABLET | Freq: Every day | ORAL | Status: DC
  Administered 2011-09-26: 10 mg via ORAL
  Filled 2011-09-26: qty 1

## 2011-09-26 MED ORDER — OXYCODONE-ACETAMINOPHEN 5-325 MG PO TABS: ORAL_TABLET | ORAL | Status: DC

## 2011-09-26 MED ORDER — TAMOXIFEN CITRATE 20 MG PO TABS
20.0000 mg | ORAL_TABLET | Freq: Every day | ORAL | Status: DC
  Administered 2011-09-26: 20 mg via ORAL
  Filled 2011-09-26: qty 1

## 2011-09-26 MED ORDER — HYDROMORPHONE HCL PF 1 MG/ML IJ SOLN
0.5000 mg | INTRAMUSCULAR | Status: AC | PRN
  Administered 2011-09-26 (×4): 0.5 mg via INTRAVENOUS

## 2011-09-26 MED ORDER — FENTANYL CITRATE 0.05 MG/ML IJ SOLN
INTRAMUSCULAR | Status: DC | PRN
  Administered 2011-09-26: 50 ug via INTRAVENOUS
  Administered 2011-09-26: 100 ug via INTRAVENOUS
  Administered 2011-09-26 (×2): 50 ug via INTRAVENOUS

## 2011-09-26 MED ORDER — ALPRAZOLAM 0.5 MG PO TABS
0.5000 mg | ORAL_TABLET | Freq: Three times a day (TID) | ORAL | Status: DC | PRN
  Administered 2011-09-26: 0.5 mg via ORAL
  Filled 2011-09-26: qty 1

## 2011-09-26 MED ORDER — OXYCODONE-ACETAMINOPHEN 5-325 MG PO TABS
1.0000 | ORAL_TABLET | ORAL | Status: DC | PRN
  Administered 2011-09-26 (×2): 2 via ORAL
  Filled 2011-09-26 (×2): qty 2

## 2011-09-26 SURGICAL SUPPLY — 44 items
BAG ZIPLOCK 12X15 (MISCELLANEOUS) IMPLANT
BANDAGE ELASTIC 4 VELCRO ST LF (GAUZE/BANDAGES/DRESSINGS) IMPLANT
BANDAGE GAUZE ELAST BULKY 4 IN (GAUZE/BANDAGES/DRESSINGS) IMPLANT
BNDG COHESIVE 4X5 TAN NS LF (GAUZE/BANDAGES/DRESSINGS) ×2 IMPLANT
BNDG COHESIVE 4X5 TAN STRL (GAUZE/BANDAGES/DRESSINGS) IMPLANT
CANISTER SUCTION 2500CC (MISCELLANEOUS) IMPLANT
CLEANER TIP ELECTROSURG 2X2 (MISCELLANEOUS) IMPLANT
CLOTH BEACON ORANGE TIMEOUT ST (SAFETY) ×2 IMPLANT
CORDS BIPOLAR (ELECTRODE) ×2 IMPLANT
CUFF TOURN SGL QUICK 18 (TOURNIQUET CUFF) ×2 IMPLANT
CUFF TOURN SGL QUICK 24 (TOURNIQUET CUFF)
CUFF TRNQT CYL 24X4X40X1 (TOURNIQUET CUFF) IMPLANT
DRAIN PENROSE 18X1/2 LTX STRL (DRAIN) IMPLANT
DRSG PAD ABDOMINAL 8X10 ST (GAUZE/BANDAGES/DRESSINGS) IMPLANT
ELECT REM PT RETURN 9FT ADLT (ELECTROSURGICAL) ×2
ELECTRODE REM PT RTRN 9FT ADLT (ELECTROSURGICAL) ×1 IMPLANT
GAUZE PACKING IODOFORM 1/2 (PACKING) IMPLANT
GAUZE PACKING IODOFORM 1/4X5 (PACKING) ×2 IMPLANT
GAUZE XEROFORM 1X8 LF (GAUZE/BANDAGES/DRESSINGS) IMPLANT
GAUZE XEROFORM 5X9 LF (GAUZE/BANDAGES/DRESSINGS) IMPLANT
GLOVE BIO SURGEON STRL SZ7.5 (GLOVE) ×4 IMPLANT
GLOVE BIOGEL PI IND STRL 8 (GLOVE) ×1 IMPLANT
GLOVE BIOGEL PI INDICATOR 8 (GLOVE) ×1
GOWN STRL REIN XL XLG (GOWN DISPOSABLE) ×2 IMPLANT
HANDPIECE INTERPULSE COAX TIP (DISPOSABLE)
KIT BASIN OR (CUSTOM PROCEDURE TRAY) ×2 IMPLANT
MANIFOLD NEPTUNE II (INSTRUMENTS) ×2 IMPLANT
NEEDLE HYPO 25X1 1.5 SAFETY (NEEDLE) ×2 IMPLANT
PACK LOWER EXTREMITY WL (CUSTOM PROCEDURE TRAY) ×2 IMPLANT
PAD CAST 4YDX4 CTTN HI CHSV (CAST SUPPLIES) IMPLANT
PADDING CAST COTTON 4X4 STRL (CAST SUPPLIES)
SET CYSTO W/LG BORE CLAMP LF (SET/KITS/TRAYS/PACK) ×2 IMPLANT
SET HNDPC FAN SPRY TIP SCT (DISPOSABLE) IMPLANT
SPLINT PLASTER EXTRA FAST 3X15 (CAST SUPPLIES) ×1
SPLINT PLASTER GYPS XFAST 3X15 (CAST SUPPLIES) ×1 IMPLANT
SPONGE GAUZE 4X4 12PLY (GAUZE/BANDAGES/DRESSINGS) IMPLANT
SUT ETHILON 4 0 PS 2 18 (SUTURE) IMPLANT
SUT SILK 4 0 PS 2 (SUTURE) IMPLANT
SWAB COLLECTION DEVICE MRSA (MISCELLANEOUS) ×2 IMPLANT
SYR 20CC LL (SYRINGE) IMPLANT
SYR CONTROL 10ML LL (SYRINGE) ×4 IMPLANT
TUBE ANAEROBIC SPECIMEN COL (MISCELLANEOUS) ×2 IMPLANT
TUBE FEEDING 5FR 36IN KANGAROO (TUBING) ×2 IMPLANT
VESSEL CANN W0 1 W VA 30003 (MISCELLANEOUS) ×2 IMPLANT

## 2011-09-26 NOTE — Op Note (Signed)
Dictation [phone]

## 2011-09-26 NOTE — Progress Notes (Signed)
ANTIBIOTIC CONSULT NOTE - INITIAL  Pharmacy Consult for Unasyn Indication: Right infected cat bite wound  Allergies  Allergen Reactions  . Phenytoin Nausea And Vomiting  . Amoxicillin-Pot Clavulanate Other (See Comments)    faint  . Thorazine (Chlorpromazine Hcl) Hives  . Amoxicillin-Pot Clavulanate Other (See Comments)    Syncope  . Ciprofloxacin Nausea And Vomiting  . Vancomycin Rash    Possible rash reported by MD    Patient Measurements: Height: 5\' 4"  (162.6 cm) Weight: 116 lb (52.617 kg) IBW/kg (Calculated) : 54.7   Vital Signs: Temp: 97 F (36.1 C) (05/27 0910) Temp src: Oral (05/27 0910) BP: 124/77 mmHg (05/27 0910) Pulse Rate: 92  (05/27 0910) Intake/Output from previous day:   Intake/Output from this shift: Total I/O In: 1200 [I.V.:1200] Out: 100 [Urine:100]  Labs:  [facility] 09/26/11 0257  WBC 10.8*  HGB 12.9  PLT 172  LABCREA --  CREATININE 0.88   Estimated Creatinine Clearance: 75.5 ml/min (by C-G formula based on Cr of 0.88).  Microbiology: Recent Results (from the past 720 hour(s))  GRAM STAIN     Status: Normal   Collection Time   09/26/11  6:37 AM      Component Value Range Status Comment   Specimen Description WOUND RIGHT HAND   Final    Special Requests PATIENT ON FOLLOWING UNASYN   Final    Gram Stain     Final    Value: ABUNDANT WBC SEEN     RARE GRAM NEGATIVE RODS     Gram Stain Report Called to,Read Back By and Verified With:     DR. Merlyn Lot AT 0750 ON 09/26/11 BY C.BONGEL   Report Status 09/26/2011 FINAL   Final     Medical History: Past Medical History  Diagnosis Date  . Allergy   . Right breast ca dx'd 10/2010  . Breast cancer 12/03/10  . Status post chemotherapy     docetaxel/carboplatin/trastuzumab  . Anxiety   . Seizures     last seizure 2 years ago, diagnosed 3 years ago  . Hx MRSA infection 03/28/11    Skin   Assessment: 17 YOF s/p I&D of infected cat bite wound to begin Unasyn for treatment.  WBC mildly elevated  and patient is afebrile.  CrCl~75 mL/min.  Patient has already received 3g x 1 early this AM.  Goal of Therapy:  dose adjusted per renal clearance  Plan:  Unasyn 3g IV q6h. F/u SCr and culture results.  Clance Boll 09/26/2011,10:02 AM

## 2011-09-26 NOTE — Preoperative (Signed)
Beta Blockers   Reason not to administer Beta Blockers:Not Applicable 

## 2011-09-26 NOTE — Op Note (Signed)
Crystal [name], BAGBY               ACCOUNT NO.:  [id number]  MEDICAL RECORD NO.:  [id number]  LOCATION:  [facility]                         FACILITY:  [facility]  PHYSICIAN:  Betha Loa, MD        DATE OF BIRTH:  09-14-1975  DATE OF PROCEDURE:  09/26/2011 DATE OF DISCHARGE:                              OPERATIVE REPORT   PREOPERATIVE DIAGNOSIS:  Right infected cat bite wound.  POSTOPERATIVE DIAGNOSIS:  Right thumb infected interphalangeal joint, flexor tendon sheath and multiple cat bite wounds.  PROCEDURE:   1. Irrigation and debridement of right thumb flexor tendon sheath  2. Irrigation and debridement right thumb IP joint 3. Irrigation and debridement right thumb skin & subcutaneous tissues 4. Irrigation and debridement right index finger skin & subcutaneous tissues  SURGEON:  Betha Loa, MD  ASSISTANT:  None.  ANESTHESIA:  General.  IV FLUIDS:  Per anesthesia flow sheet.  ESTIMATED BLOOD LOSS:  Minimal.  COMPLICATIONS:  None.  SPECIMENS:  None.  CULTURES:  To micro.  TOURNIQUET TIME:  73 minutes.  DISPOSITION:  Stable to PACU.  INDICATIONS:  Crystal [name] is a 36 year old right-hand dominant female who was bitten by her own cat in the morning of 09/25/2011.  She was seen in Urgent Care where she was started on doxycycline.  She noted worsening of her pain, erythema and swelling throughout the day.  She presented to [facility] Emergency Department in the evening.  I was consulted for management of the condition.  On examination, she had intact sensation and capillary refill all fingertips.  She had multiple cat bite wounds on the thumb and index finger.  The thumb was swollen and erythematous. There was erythema into the forearm.  She had swelling of the thumb and tenderness both volarly and dorsally.  She had pain with motion of the IP joint.  I recommended Crystal. [name] go into the operating room for irrigation and debridement of the multiple cat bite wounds.   Risks, benefits and alternatives to surgery were discussed including the risks of blood loss, infection, damage to nerves, vessels, tendons, ligaments, bone, failure of  surgery, need for additional surgery, complications with wound healing, continued pain, continued infection, need for repeat irrigation debridement.  She voiced understanding of these risks and elected to proceed.  OPERATIVE COURSE:  After being identified preoperatively by myself, the patient and I agreed upon the procedure and site of procedure.  Surgical site was marked.  Risks, benefits, alternatives surgery were reviewed and she wished to proceed.  Surgical consent had been signed.  She had been started on IV Unasyn in the emergency department as coverage for her infection.  She was transferred to the operating room and placed on the operating room table in supine position with her right upper extremity on an arm board.  General anesthesia was induced by the anesthesiologist.  The right upper extremity was prepped and draped in normal sterile orthopedic fashion.  Surgical pause was performed between surgeons, anesthesia and operating staff and all were in agreement on the patient, procedure and site of procedure.  Tourniquet at the proximal aspect of the forearm was inflated to 250 mmHg after gravity exsanguination of  the limb.  The index finger was addressed first. There was a wound over the radial side of the proximal phalanx and one over the dorsal aspect of the PIP joint.  These wounds were extended using the knife.  There was no gross purulence.  The wounds went to the subcutaneous tissues,  the tendon did not seem to be violated. Attention was turned to the thumb.  The wound on the dorsum of the IP joint was extended using the knife.  There was purulence in the subcutaneous tissues.  The IP joint was entered.  There was gross purulence within the IP joint.  Cultures were taken and sent to micro for  examination.  The IP joint was copiously irrigated with 150 mL of sterile saline by vessel cannula.  There was an additional wound at the proximal aspect of the proximal phalanx.  This was extended as well.  It was carried into subcutaneous tissues.  There was edema in this area. It was felt that the area underneath the tendon was boggy.  The tendon was elevated from the ulnar side.  There was no gross purulence.  This area was irrigated with 100 mL of sterile saline by vessel cannula. Attention was turned to the volar aspect of the thumb.  The wounds at the volar aspect of the thumb near the IP joint were opened.  The distal wound was extended into the pulp of the finger.  The flexor tendon sheath was identified.  An additional incision was made at the wrist. This was over the FCR tendon.  The tendon sheath was incised and the FCR retracted ulnarly.  The FPL tendon was identified.  A #5 pediatric feeding tube was placed into the distal wound of the thumb and the flexor tendon sheath.  Saline was irrigated through this.  There was good effluent from both the distal thumb wound and the wound at the wrist.  Three hundred mL of sterile saline was irrigated through this wound.  There was no gross purulence.  The pediatric feeding tube was left in as a drain.  All wounds were copiously irrigated with a 1000 mL of sterile saline.  Vessel loop drains were used at the IP joint of the thumb and at the wound at the dorsum of the proximal phalanx.  All wounds were packed with quarter-inch iodoform packing.  They were dressed with sterile Xeroform.  Ten mL of 0.25% plain Marcaine was used to aid in postoperative analgesia.  The wounds were dressed with sterile Xeroform, 4x4s, and wrapped with a Kerlix bandage.  A thumb spica splint was placed and wrapped with Kerlix and Ace bandage.  Tourniquet was deflated at 73 minutes.  The fingertips were pink with brisk capillary refill after deflation of  tourniquet.  Operative drapes were broken down.  The patient was awoken from anesthesia safely.  She has transferred back to a stretcher and taken to PACU in stable condition. She will be kept today for IV antibiotics.  We will check in the evening and potentially discharge her home.     Betha Loa, MD     KK/MEDQ  D:  09/26/2011  T:  09/26/2011  Job:  [id number]

## 2011-09-26 NOTE — H&P (Signed)
Crystal [name] is an 36 y.o. female.   Chief Complaint: right hand cat bite HPI: 82 yo rhd female bitten by her cat morning of 09/25/11.  Seen at urgent care and started on doxycycline.  Noted increased swelling and pain in right thumb since.  No previous bites and no bites except on right hand.  States cat has had its shots and she has had tetanus updated in last ten years.  Past Medical History  Diagnosis Date  . Allergy   . Right breast ca dx'd 10/2010  . Breast cancer 12/03/10  . Status post chemotherapy     docetaxel/carboplatin/trastuzumab  . Anxiety   . Seizures     last seizure 2 years ago, diagnosed 3 years ago  . Hx MRSA infection 03/28/11    Skin    Past Surgical History  Procedure Date  . Wisdom tooth extraction   . Breast surgery 11/16/2010    bilateral mastectomy+Ralnd,T1cN1a, Her2+,ERPR+  . Biopsy breast 10/14/2010    right needle core biopsy  . Portacath placement 11/16/2010    placement of left subclavian port    Family History  Problem Relation Age of Onset  . Cancer Maternal Grandfather    Social History:  reports that she has been smoking Cigarettes.  She has a 40 pack-year smoking history. She has never used smokeless tobacco. She reports that she does not drink alcohol or use illicit drugs.  Allergies:  Allergies  Allergen Reactions  . Phenytoin Nausea And Vomiting  . Amoxicillin-Pot Clavulanate Other (See Comments)    faint  . Thorazine (Chlorpromazine Hcl) Hives  . Amoxicillin-Pot Clavulanate Other (See Comments)    Syncope  . Ciprofloxacin Nausea And Vomiting  . Vancomycin Rash    Possible rash reported by MD     (Not in a hospital admission)  Results for orders placed during the hospital encounter of 09/26/11 (from the past 48 hour(s))  CBC     Status: Abnormal   Collection Time   09/26/11  2:57 AM      Component Value Range Comment   WBC 10.8 (*) 4.0 - 10.5 (K/uL)    RBC 3.79 (*) 3.87 - 5.11 (MIL/uL)    Hemoglobin 12.9  12.0 - 15.0  (g/dL)    HCT 16.1 (*) 09.6 - 46.0 (%)    MCV 94.7  78.0 - 100.0 (fL)    MCH 34.0  26.0 - 34.0 (pg)    MCHC 35.9  30.0 - 36.0 (g/dL)    RDW 04.5  40.9 - 81.1 (%)    Platelets 172  150 - 400 (K/uL)   BASIC METABOLIC PANEL     Status: Abnormal   Collection Time   09/26/11  2:57 AM      Component Value Range Comment   Sodium 139  135 - 145 (mEq/L)    Potassium 3.6  3.5 - 5.1 (mEq/L)    Chloride 105  96 - 112 (mEq/L)    CO2 22  19 - 32 (mEq/L)    Glucose, Bld 91  70 - 99 (mg/dL)    BUN 14  6 - 23 (mg/dL)    Creatinine, Ser 9.14  0.50 - 1.10 (mg/dL)    Calcium 8.4  8.4 - 10.5 (mg/dL)    GFR calc non Af Amer 85 (*) >90 (mL/min)    GFR calc Af Amer >90  >90 (mL/min)     Dg Hand Complete Right  09/26/2011  *RADIOLOGY REPORT*  Clinical Data: Swelling and bruising after a  cat bite.  RIGHT HAND - COMPLETE 3+ VIEW  Comparison: None.  Findings: Right hand appears intact. No evidence of acute fracture or subluxation.  No focal bone lesions.  Bone matrix and cortex appear intact.  No abnormal radiopaque densities in the soft tissues.  No subcutaneous emphysema.  Mild soft tissue swelling in the webspace between the first and second fingers.  IMPRESSION: Mild soft tissue swelling.  No radiopaque foreign bodies or subcutaneous gas collections.  No acute bony abnormalities.  Original Report Authenticated By: Marlon Pel, M.D.     A comprehensive review of systems was negative except for: Respiratory: positive for shortness of breath Gastrointestinal: positive for diarrhea  Blood pressure 109/80, pulse 86, temperature 97.8 F (36.6 C), temperature source Oral, resp. rate 22, SpO2 97.00%.  General appearance: alert, cooperative, appears stated age and mild distress Head: Normocephalic, without obvious abnormality, atraumatic Neck: supple, symmetrical, trachea midline Resp: clear to auscultation bilaterally Cardio: regular rate and rhythm GI: soft, non-tender; bowel sounds normal; no masses,   no organomegaly Extremities: light touch sensation and capillary refill intact all digits.  +epl/fpl/io.  left ue: no wounds or ttp.  right ue: swollen in thumb and slightly on dorsum of hand.  erythema of thumb and into forearm.  ttp volarly and dorsally in thumb.  no tenderness in palm or wrist.  no tenderness in index finger.  bite wound on radial p1 of index, over p1 of thumb dorsally and at ip level of thumb both volarly and dorsally.   Pulses: 2+ and symmetric Skin: as above Neurologic: Grossly normal Incision/Wound: As above  Assessment/Plan Right hand infected cat bite wounds with thumb flexor tenosynovitis.  Recommend OR for I&D of right hand and thumb flexor sheath.  Risks, benefits, and alternatives of surgery were discussed and the patient agrees with the plan of care.   Soriyah Osberg R 09/26/2011, 4:19 AM

## 2011-09-26 NOTE — Discharge Instructions (Signed)
[facility] Instructions Hand Surgery  Wound Care: Keep your hand elevated above the level of your heart.  Do not allow it to dangle  by your side.  Keep the dressing dry and do not remove it unless your doctor advises you to do so.  He will usually change it at the time of your post-op visit.  Moving your fingers is advised to stimulate circulation but will depend on the site of your surgery.  If you have a splint applied, your doctor will advise you regarding movement.  Activity: Do not drive or operate machinery today.  Rest today and then you may return to your normal activity and work as indicated by your physician.  Diet:  Drink liquids today or eat a light diet.  You may resume a regular diet tomorrow.    General expectations: Pain for two to three days. Fingers may become slightly swollen.  Call your doctor if any of the following occur: Severe pain not relieved by pain medication. Elevated temperature. Dressing soaked with blood. Inability to move fingers. White or bluish color to fingers. Animal Bite An animal bite can result in a scratch on the skin, deep open cut, puncture of the skin, crush injury, or tearing away of the skin or a body part. Dogs are responsible for most animal bites. Children are bitten more often than adults. An animal bite can range from very mild to more serious. A small bite from your house pet is no cause for alarm. However, some animal bites can become infected or injure a bone or other tissue. You must seek medical care if:  The skin is broken and bleeding does not slow down or stop after 15 minutes.   The puncture is deep and difficult to clean (such as a cat bite).   Pain, warmth, redness, or pus develops around the wound.   The bite is from a stray animal or rodent. There may be a risk of rabies infection.   The bite is from a snake, raccoon, skunk, fox, coyote, or bat. There may be a risk of rabies infection.   The person bitten has a  chronic illness such as diabetes, liver disease, or cancer, or the person takes medicine that lowers the immune system.   There is concern about the location and severity of the bite.  It is important to clean and protect an animal bite wound right away to prevent infection. Follow these steps:  Clean the wound with plenty of water and soap.   Apply an antibiotic cream.   Apply gentle pressure over the wound with a clean towel or gauze to slow or stop bleeding.   Elevate the affected area above the heart to help stop any bleeding.   Seek medical care. Getting medical care within 8 hours of the animal bite leads to the best possible outcome.  DIAGNOSIS  Your caregiver will most likely:  Take a detailed history of the animal and the bite injury.   Perform a wound exam.   Take your medical history.  Blood tests or X-rays may be performed. Sometimes, infected bite wounds are cultured and sent to a lab to identify the infectious bacteria.  TREATMENT  Medical treatment will depend on the location and type of animal bite as well as the patient's medical history. Treatment may include:  Wound care, such as cleaning and flushing the wound with saline solution, bandaging, and elevating the affected area.   Antibiotics.   Tetanus immunization.   Rabies  immunization.   Leaving the wound open to heal. This is often done with animal bites, due to the high risk of infection. However, in certain cases, wound closure with stitches, wound adhesive, skin adhesive strips, or staples may be used.  Infected bites that are left untreated may require intravenous (IV) antibiotics and surgical treatment in the hospital. HOME CARE INSTRUCTIONS  Follow your caregiver's instructions for wound care.   Take all medicines as directed.   If your caregiver prescribes antibiotics, take them as directed. Finish them even if you start to feel better.   Follow up with your caregiver for further exams or  immunizations as directed.  You may need a tetanus shot if:  You cannot remember when you had your last tetanus shot.   You have never had a tetanus shot.   The injury broke your skin.  If you get a tetanus shot, your arm may swell, get red, and feel warm to the touch. This is common and not a problem. If you need a tetanus shot and you choose not to have one, there is a rare chance of getting tetanus. Sickness from tetanus can be serious. SEEK MEDICAL CARE IF:  You notice warmth, redness, soreness, swelling, pus discharge, or a bad smell coming from the wound.   You have a red line on the skin coming from the wound.   You have a fever, chills, or a general ill feeling.   You have nausea or vomiting.   You have continued or worsening pain.   You have trouble moving the injured part.   You have other questions or concerns.  MAKE SURE YOU:  Understand these instructions.   Will watch your condition.   Will get help right away if you are not doing well or get worse.  Document Released: 01/04/2011 Document Revised: 04/07/2011 Document Reviewed: 01/04/2011 [facility] Patient Information 2012 [place], [place].Abscess An abscess (boil or furuncle) is an infected area under your skin. This area is filled with yellowish white fluid (pus). HOME CARE   Only take medicine as told by your doctor.   Keep the skin clean around your abscess. Keep clothes that may touch the abscess clean.   Change any bandages (dressings) as told by your doctor.   Avoid direct skin contact with other people. The infection can spread by skin contact with others.   Practice good hygiene and do not share personal care items.   Do not share athletic equipment, towels, or whirlpools. Shower after every practice or work out session.   If a draining area cannot be covered:   Do not play sports.   Children should not go to daycare until the wound has healed or until fluid (drainage) stops coming out of the  wound.   See your doctor for a follow-up visit as told.  GET HELP RIGHT AWAY IF:   There is more pain, puffiness (swelling), and redness in the wound site.   There is fluid or bleeding from the wound site.   You have muscle aches, chills, fever, or feel sick.   You or your child has a temperature by mouth above 102 F (38.9 C), not controlled by medicine.   Your baby is older than 3 months with a rectal temperature of 102 F (38.9 C) or higher.  MAKE SURE YOU:   Understand these instructions.   Will watch your condition.   Will get help right away if you are not doing well or get worse.  Document Released:  10/05/2007 Document Revised: 04/07/2011 Document Reviewed: 10/05/2007 [facility] Patient Information 2012 [place], [place].

## 2011-09-26 NOTE — Transfer of Care (Signed)
Immediate Anesthesia Transfer of Care Note  Patient: Crystal [name]  Procedure(s) Performed: Procedure(s) (LRB): IRRIGATION AND DEBRIDEMENT EXTREMITY (Right)  Patient Location: PACU  Anesthesia Type: General  Level of Consciousness: awake, alert , oriented and pateint uncooperative  Airway & Oxygen Therapy: Patient Spontanous Breathing and Patient connected to face mask oxygen  Post-op Assessment: Report given to PACU RN, Post -op Vital signs reviewed and stable and Patient moving all extremities  Post vital signs: Reviewed and stable  Complications: No apparent anesthesia complications

## 2011-09-26 NOTE — Consult Note (Addendum)
Reason for Consult: Management of medical issues. Referring Physician: Dr.Kuzma. PCP - [facility]. Crystal [name] is an 36 y.o. female.  HPI: 36 year old female who is known history of breast cancer status post bilateral mastectomy radiation and recently diagnosed CHF secondary to Herceptin EF was 45-50% measured in Aug 31, 2011 EF measured on November 2012 was 60-65% has come to the ER because of worsening right thumb and hand pain and swelling after she sustained a cat bite yesterday morning. She had gone to an urgent care and was prescribed oxygen despite taking which is filling worsened with discoloration of the thumb. X-rays were done in the ER which shows soft tissue swelling and hand surgery was consulted. Patient is expected to go to the OR for I&D and medical consult has been requested to manage medical issues. Presently patient denies any shortness of breath, cough, chest pain, fever chills, nausea vomiting, abdominal pain or any dizziness. Patient has been having chronic diarrhea for last few months and I BS was one of the considerations for her chronic diarrhea. Patient has been already started on Unasyn IV.  Past Medical History  Diagnosis Date  . Allergy   . Right breast ca dx'd 10/2010  . Breast cancer 12/03/10  . Status post chemotherapy     docetaxel/carboplatin/trastuzumab  . Anxiety   . Seizures     last seizure 2 years ago, diagnosed 3 years ago  . Hx MRSA infection 03/28/11    Skin    Past Surgical History  Procedure Date  . Wisdom tooth extraction   . Breast surgery 11/16/2010    bilateral mastectomy+Ralnd,T1cN1a, Her2+,ERPR+  . Biopsy breast 10/14/2010    right needle core biopsy  . Portacath placement 11/16/2010    placement of left subclavian port    Family History  Problem Relation Age of Onset  . Cancer Maternal Grandfather     Social History:  reports that she has been smoking Cigarettes.  She has a 40 pack-year smoking history. She has never used  smokeless tobacco. She reports that she does not drink alcohol or use illicit drugs.  Allergies:  Allergies  Allergen Reactions  . Phenytoin Nausea And Vomiting  . Amoxicillin-Pot Clavulanate Other (See Comments)    faint  . Thorazine (Chlorpromazine Hcl) Hives  . Amoxicillin-Pot Clavulanate Other (See Comments)    Syncope  . Ciprofloxacin Nausea And Vomiting  . Vancomycin Rash    Possible rash reported by MD    Medications: I have reviewed the patient's current medications.  Results for orders placed during the hospital encounter of 09/26/11 (from the past 48 hour(s))  CBC     Status: Abnormal   Collection Time   09/26/11  2:57 AM      Component Value Range Comment   WBC 10.8 (*) 4.0 - 10.5 (K/uL)    RBC 3.79 (*) 3.87 - 5.11 (MIL/uL)    Hemoglobin 12.9  12.0 - 15.0 (g/dL)    HCT 96.0 (*) 45.4 - 46.0 (%)    MCV 94.7  78.0 - 100.0 (fL)    MCH 34.0  26.0 - 34.0 (pg)    MCHC 35.9  30.0 - 36.0 (g/dL)    RDW 09.8  11.9 - 14.7 (%)    Platelets 172  150 - 400 (K/uL)   BASIC METABOLIC PANEL     Status: Abnormal   Collection Time   09/26/11  2:57 AM      Component Value Range Comment   Sodium 139  135 -  145 (mEq/L)    Potassium 3.6  3.5 - 5.1 (mEq/L)    Chloride 105  96 - 112 (mEq/L)    CO2 22  19 - 32 (mEq/L)    Glucose, Bld 91  70 - 99 (mg/dL)    BUN 14  6 - 23 (mg/dL)    Creatinine, Ser 1.61  0.50 - 1.10 (mg/dL)    Calcium 8.4  8.4 - 10.5 (mg/dL)    GFR calc non Af Amer 85 (*) >90 (mL/min)    GFR calc Af Amer >90  >90 (mL/min)     Dg Hand Complete Right  09/26/2011  *RADIOLOGY REPORT*  Clinical Data: Swelling and bruising after a cat bite.  RIGHT HAND - COMPLETE 3+ VIEW  Comparison: None.  Findings: Right hand appears intact. No evidence of acute fracture or subluxation.  No focal bone lesions.  Bone matrix and cortex appear intact.  No abnormal radiopaque densities in the soft tissues.  No subcutaneous emphysema.  Mild soft tissue swelling in the webspace between the first  and second fingers.  IMPRESSION: Mild soft tissue swelling.  No radiopaque foreign bodies or subcutaneous gas collections.  No acute bony abnormalities.  Original Report Authenticated By: Marlon Pel, M.D.    Review of Systems  Constitutional: Positive for fever and chills.  HENT: Negative.   Eyes: Negative.   Respiratory: Negative.   Cardiovascular: Negative.   Gastrointestinal: Negative.   Genitourinary: Negative.   Musculoskeletal:       Pain and swelling right hand.Discolration of the skin of the right thumb.  Skin: Negative.   Neurological: Negative.   Endo/Heme/Allergies: Negative.   Psychiatric/Behavioral: Negative.    Blood pressure 136/85, pulse 85, temperature 97.8 F (36.6 C), temperature source Oral, resp. rate 22, SpO2 95.00%. Physical Exam  Constitutional: She is oriented to person, place, and time. She appears well-developed and well-nourished. No distress.  HENT:  Head: Normocephalic and atraumatic.  Right Ear: External ear normal.  Left Ear: External ear normal.  Mouth/Throat: No oropharyngeal exudate.  Eyes: Conjunctivae are normal. Pupils are equal, round, and reactive to light. Right eye exhibits no discharge. Left eye exhibits no discharge. No scleral icterus.  Neck: Normal range of motion. Neck supple.  Cardiovascular: Normal rate and regular rhythm.   Respiratory: Effort normal and breath sounds normal. No respiratory distress. She has no wheezes. She has no rales.  GI: Soft. Bowel sounds are normal. She exhibits no distension. There is no tenderness. There is no rebound.  Musculoskeletal:       Right thumb skin is discolored. Right hand is swollen upto the wrist. Difficult to make a wrist.  Neurological: She is alert and oriented to person, place, and time. She has normal reflexes. No cranial nerve deficit. Coordination normal.  Skin: She is not diaphoretic.    Assessment/Plan: #1. Cellulitis of the right hand after cat bite - continue Unasyn IV  per pharmacy and surgical management as per Dr. Merlyn Lot. #2. Recently diagnosed CHF secondary to Herceptin and Herceptin has been stopped - presently patient is on Coreg and patient is not short of breath and not decompensated. We'll continue to observe. #3. Right-sided breast cancer status post bilateral mastectomy and right-sided axilla dissection status post chemotherapy and radiation therapy - as per oncology(follows Dr. Lorita Officer). #4. History of seizure disorder last seizure was this March 2 months ago - patient is not on any antiseizure medication at this time. She use to follow with neurologist but stopped antiseizure medications 2 years  ago. Patient feels her seizures are stress-related. #5. Ongoing tobacco abuse - strongly advised to quit smoking. #6. History of neurocardiogenic syncope. #7. Chronic diarrhea.  Tetanus vaccination has received within 10 years. Her cat has been completely vaccinated.  Thanks for involving [place] in patient's care. We will follow along with you.  Tasha Jindra N. 09/26/2011, 5:31 AM

## 2011-09-26 NOTE — ED Provider Notes (Signed)
History     CSN: [id number]  Arrival date & time 09/26/11  0134   First MD Initiated Contact with Patient 09/26/11 [phone]      Chief Complaint  Patient presents with  . Animal Bite    (Consider location/radiation/quality/duration/timing/severity/associated sxs/prior treatment) HPI Comments: 36 year old female with a history of breast cancer status post bilateral mastectomy who presents with a complaint of cat bite to the right hand. This occurred earlier in the day, she was seen already at an urgent care where she was diagnosed with a cat bite, given doxycycline and told to followup if things got worse. She's had one dose of this medication over the last several hours the swelling and pain has gotten worse and she feels like the pain is radiating up the base of her thumb and base of the index finger into her wrist. There is associated swelling and redness around the thenar eminence on the right hand and extending in an ulnar fashion across the dorsum of the hand. The symptoms are gradually getting worse, nothing makes this better, worse with palpation and range of motion  Patient is a 36 y.o. female presenting with animal bite. The history is provided by the patient.  Animal Bite     Past Medical History  Diagnosis Date  . Allergy   . Right breast ca dx'd 10/2010  . Breast cancer 12/03/10  . Status post chemotherapy     docetaxel/carboplatin/trastuzumab  . Anxiety   . Seizures     last seizure 2 years ago, diagnosed 3 years ago  . Hx MRSA infection 03/28/11    Skin    Past Surgical History  Procedure Date  . Wisdom tooth extraction   . Breast surgery 11/16/2010    bilateral mastectomy+Ralnd,T1cN1a, Her2+,ERPR+  . Biopsy breast 10/14/2010    right needle core biopsy  . Portacath placement 11/16/2010    placement of left subclavian port    Family History  Problem Relation Age of Onset  . Cancer Maternal Grandfather     History  Substance Use Topics  . Smoking status:  Current Some Day Smoker -- 2.0 packs/day for 20 years    Types: Cigarettes    Last Attempt to Quit: 11/01/2010  . Smokeless tobacco: Never Used  . Alcohol Use: No     quit drinking 6 months    OB History    Grav Para Term Preterm Abortions TAB SAB Ect Mult Living   1 1 1  0 0 0 0 0 0 0      Review of Systems  Constitutional: Negative for fever and chills.  Musculoskeletal: Positive for joint swelling. Negative for myalgias.  Skin: Positive for rash and wound.  All other systems reviewed and are negative.    Allergies  Phenytoin; Amoxicillin-pot clavulanate; Thorazine; Amoxicillin-pot clavulanate; Ciprofloxacin; and Vancomycin  Home Medications   Current Outpatient Rx  Name Route Sig Dispense Refill  . ALPRAZOLAM 0.5 MG PO TABS  Take one of two pills three times daily as needed for anxiety 180 tablet 4  . CARVEDILOL 3.125 MG PO TABS Oral Take 1 tablet (3.125 mg total) by mouth 2 (two) times daily with a meal. 60 tablet 6  . CETIRIZINE HCL 10 MG PO TABS Oral Take 10 mg by mouth daily.    Marland Kitchen VITAMIN D 1000 UNITS PO TABS Oral Take 2,500 Units by mouth daily.     Marland Kitchen CITALOPRAM HYDROBROMIDE 20 MG PO TABS Oral Take 1 tablet (20 mg total) by mouth daily. 30  tablet 1  . DIPHENHYDRAMINE HCL 25 MG PO TABS Oral Take 25-50 mg by mouth at bedtime as needed.     . MULTI-VITAMIN/MINERALS PO TABS Oral Take 1 tablet by mouth daily. (special formulation 2 capsules BID)    . NAPROXEN SODIUM 220 MG PO TABS Oral Take 220 mg by mouth 2 (two) times daily with a meal.    . POLYETHYLENE GLYCOL 3350 PO PACK Oral Take 17 g by mouth daily as needed. constipation    . PROMETHAZINE HCL 25 MG PO TABS Oral Take 25 mg by mouth every 6 (six) hours as needed. nausea    . RANITIDINE HCL 150 MG PO CAPS Oral Take 150 mg by mouth 2 (two) times daily as needed. For heartburn    . TAMOXIFEN CITRATE 20 MG PO TABS Oral Take 20 mg by mouth daily.    Marland Kitchen HERCEPTIN IV Intravenous Inject into the vein. Q3 weeks    .  AMOXICILLIN-POT CLAVULANATE 875-125 MG PO TABS Oral Take 1 tablet by mouth every 12 (twelve) hours. 20 tablet 0  . OXYCODONE-ACETAMINOPHEN 5-325 MG PO TABS  1-2 tabs po q6 hours prn pain 40 tablet 0    BP 104/68  Pulse 85  Temp(Src) 99 F (37.2 C) (Oral)  Resp 16  Ht 5\' 4"  (1.626 m)  Wt 116 lb (52.617 kg)  BMI 19.91 kg/m2  SpO2 97%  Physical Exam  Nursing note and vitals reviewed. Constitutional: She appears well-developed and well-nourished. No distress.  HENT:  Head: Normocephalic and atraumatic.  Mouth/Throat: Oropharynx is clear and moist. No oropharyngeal exudate.  Eyes: Conjunctivae and EOM are normal. Pupils are equal, round, and reactive to light. Right eye exhibits no discharge. Left eye exhibits no discharge. No scleral icterus.  Neck: Normal range of motion. Neck supple. No JVD present. No thyromegaly present.  Cardiovascular: Normal rate, regular rhythm, normal heart sounds and intact distal pulses.  Exam reveals no gallop and no friction rub.   No murmur heard. Pulmonary/Chest: Effort normal and breath sounds normal. No respiratory distress. She has no wheezes. She has no rales.  Abdominal: Soft. Bowel sounds are normal. She exhibits no distension and no mass. There is no tenderness.  Musculoskeletal: Normal range of motion. She exhibits tenderness ( Tender to palpation over the dorsum of the right hand over the radial surface, redness and swelling associated, swelling of the right thumb with associated bruising and small puncture wounds). She exhibits no edema.  Lymphadenopathy:    She has no cervical adenopathy.  Neurological: She is alert. Coordination normal.  Skin: Skin is warm and dry. Rash noted. There is erythema.  Psychiatric: She has a normal mood and affect. Her behavior is normal.    ED Course  Procedures (including critical care time)  Labs Reviewed  CBC - Abnormal; Notable for the following:    WBC 10.8 (*)    RBC 3.79 (*)    HCT 35.9 (*)    All  other components within normal limits  BASIC METABOLIC PANEL - Abnormal; Notable for the following:    GFR calc non Af Amer 85 (*)    All other components within normal limits  CBC - Abnormal; Notable for the following:    RBC 3.55 (*)    Hemoglobin 11.8 (*)    HCT 33.9 (*)    Platelets 146 (*)    All other components within normal limits  WOUND CULTURE  ANAEROBIC CULTURE  GRAM STAIN   Dg Hand Complete Right  09/26/2011  *  RADIOLOGY REPORT*  Clinical Data: Swelling and bruising after a cat bite.  RIGHT HAND - COMPLETE 3+ VIEW  Comparison: None.  Findings: Right hand appears intact. No evidence of acute fracture or subluxation.  No focal bone lesions.  Bone matrix and cortex appear intact.  No abnormal radiopaque densities in the soft tissues.  No subcutaneous emphysema.  Mild soft tissue swelling in the webspace between the first and second fingers.  IMPRESSION: Mild soft tissue swelling.  No radiopaque foreign bodies or subcutaneous gas collections.  No acute bony abnormalities.  Original Report Authenticated By: Marlon Pel, M.D.     1. Cellulitis of right hand   2. Cat bite   3. Anxiety       MDM  Patient has had a cat bite to the right hand with associated infection which appears to be spreading proximally especially with pain that is radiating up the arm.  She has had a bilateral mastectomy, currently vital signs are normal, will need x-ray to rule out foreign body, antibiotics, hand surgeon consult.  X-ray shows no signs of foreign body, antibiotics have been started,  there is a slight leukocytosis with a white blood cell count of 10,800, normal metabolic panel, the patient has been receiving antibiotics and I have discussed her care with the hand surgeon Dr. Merlyn Lot who will see the patient in the emergency department and has agreed to take the patient to the operating room.      Vida Roller, MD 09/27/11 [phone]

## 2011-09-26 NOTE — Progress Notes (Signed)
Subjective: Day of Surgery Procedure(s) (LRB): IRRIGATION AND DEBRIDEMENT EXTREMITY (Right) Patient reports pain as mild and moderate.  States she overall feels much better.  No complaints.  No fevers.  Objective: Vital signs in last 24 hours: Temp:  [97 F (36.1 C)-99.5 F (37.5 C)] 99 F (37.2 C) (05/27 1749) Pulse Rate:  [81-107] 85  (05/27 1749) Resp:  [13-22] 16  (05/27 1749) BP: (104-136)/(63-89) 104/68 mmHg (05/27 1749) SpO2:  [94 %-100 %] 97 % (05/27 1749) Weight:  [52.617 kg (116 lb)] 52.617 kg (116 lb) (05/27 0910)  Intake/Output from previous day:   Intake/Output this shift: Total I/O In: 1680 [P.O.:480; I.V.:1200] Out: 100 [Urine:100]   Basename 09/26/11 1723 09/26/11 0257  HGB 11.8* 12.9    Basename 09/26/11 1723 09/26/11 0257  WBC 6.1 10.8*  RBC 3.55* 3.79*  HCT 33.9* 35.9*  PLT 146* 172    Basename 09/26/11 0257  NA 139  K 3.6  CL 105  CO2 22  BUN 14  CREATININE 0.88  GLUCOSE 91  CALCIUM 8.4   No results found for this basename: LABPT:2,INR:2 in the last 72 hours  light touch sensation and capillary refill intact all digits.  wiggles fingers.  erythema in forearm present but reduced.  no proximal streaking.  mild swelling in fingers.  compartments soft.    Assessment/Plan: Day of Surgery Procedure(s) (LRB): IRRIGATION AND DEBRIDEMENT EXTREMITY (Right) Improved.  WBC decreased, afebrile, erythema decreasing.  Feeding tube drain removed.  Plan D/C home.  Will give one dose of augmentin prior to d/c to ensure patient tolerates given previous reaction.  Has done well with unasyn with no complications.  Follow up 1-2 days to initiate whirlpool therapy.  Amoreena Neubert R 09/26/2011, 6:49 PM

## 2011-09-26 NOTE — Discharge Summary (Signed)
Crystal [name], Crystal [name]               ACCOUNT NO.:  [id number]  MEDICAL RECORD NO.:  [id number]  LOCATION:  1613                         FACILITY:  [facility]  PHYSICIAN:  Betha Loa, MD        DATE OF BIRTH:  Jul 23, 1975  DATE OF ADMISSION:  09/26/2011 DATE OF DISCHARGE:  09/26/2011                              DISCHARGE SUMMARY   FINAL DIAGNOSIS:  Infected cat bites, right hand.  PROCEDURE:  Irrigation and debridement of right thumb flexor tendon sheath, right thumb IP joint, right thumb/index finger skin and subcutaneous tissues.  HISTORY:  [name] is a 36 year old right-hand-dominant female who was bitten by her cat on the morning of May 26.  She was seen at an urgent care facility and started on doxycycline.  She had continued pain, increased swelling and erythema in her hand.  She presented to [facility] emergency department in the early morning hours of May 27th.  I was consulted for management of injury.  Recommended to take [name] to the operating room for irrigation and debridement of the right hand.  Risks, benefits and alternatives to surgery were discussed.  She wished to proceed.  HOSPITAL COURSE:  [name] was taken to the operating room in the morning of May 27th.  Irrigation and debridement of the right thumb flexor tendon sheath, IP joint and right thumb and index fingers skin and subcutaneous tissues.  This was well tolerated.  She was admitted for IV antibiotics.  She tolerated Unasyn well.  On evaluation 12 hours post surgery, she had a decrease in the erythema in her forearm.  Her white count went from 10.8 to 6.1.  She was afebrile.  It was felt safe to discharge her home.  She was given 1 dose of Augmentin prior to discharge to ensure that she could tolerate it, as she has had a previous reaction, and she did well with the Unasyn.  I also wrote her a prescription for Percocet 5/325 one to two p.o. q.6 hours p.r.n. pain, dispense #40.  She will be continued on  Augmentin 875 mg p.o. b.i.d. x10 days.  She is to follow up in the office in 1-2 days for followup and initiation of hydrotherapy.     Betha Loa, MD     KK/MEDQ  D:  09/26/2011  T:  09/26/2011  Job:  [id number]

## 2011-09-26 NOTE — Anesthesia Postprocedure Evaluation (Signed)
  Anesthesia Post-op Note  Patient: Crystal [name]  Procedure(s) Performed: Procedure(s) (LRB): IRRIGATION AND DEBRIDEMENT EXTREMITY (Right)  Patient Location: PACU  Anesthesia Type: General  Level of Consciousness: awake and alert   Airway and Oxygen Therapy: Patient Spontanous Breathing  Post-op Pain: mild  Post-op Assessment: Post-op Vital signs reviewed, Patient's Cardiovascular Status Stable, Respiratory Function Stable, Patent Airway and No signs of Nausea or vomiting  Post-op Vital Signs: stable  Complications: No apparent anesthesia complications

## 2011-09-26 NOTE — Anesthesia Preprocedure Evaluation (Signed)
Anesthesia Evaluation  Patient identified by MRN, date of birth, ID band Patient awake    Reviewed: Allergy & Precautions, H&P , NPO status , Patient's Chart, lab work & pertinent test results  Airway Mallampati: II TM Distance: >3 FB Neck ROM: Full    Dental No notable dental hx.    Pulmonary neg pulmonary ROS,  breath sounds clear to auscultation  Pulmonary exam normal       Cardiovascular negative cardio ROS  Rhythm:Regular Rate:Normal     Neuro/Psych Seizures -, Well Controlled,  negative psych ROS   GI/Hepatic negative GI ROS, Neg liver ROS,   Endo/Other  H/O Breast Ca  Renal/GU negative Renal ROS  negative genitourinary   Musculoskeletal negative musculoskeletal ROS (+)   Abdominal   Peds negative pediatric ROS (+)  Hematology negative hematology ROS (+)   Anesthesia Other Findings   Reproductive/Obstetrics negative OB ROS                           Anesthesia Physical Anesthesia Plan  ASA: II and Emergent  Anesthesia Plan: General   Post-op Pain Management:    Induction: Intravenous  Airway Management Planned: LMA and Oral ETT  Additional Equipment:   Intra-op Plan:   Post-operative Plan: Extubation in OR  Informed Consent: I have reviewed the patients History and Physical, chart, labs and discussed the procedure including the risks, benefits and alternatives for the proposed anesthesia with the patient or authorized representative who has indicated his/her understanding and acceptance.   Dental advisory given  Plan Discussed with: CRNA  Anesthesia Plan Comments:         Anesthesia Quick Evaluation

## 2011-09-26 NOTE — ED Notes (Signed)
Pt states that her cat bit her earlier today on her R hand and she went to [facility] and was prescribed doxycycline. Hand has since gotten worse and is now edematous and bruised. She is also a breast cancer pt and has had lymph nodes taken out of that particular arm.

## 2011-09-26 NOTE — ED Notes (Signed)
patietn has un accessed left chest port-a-cath, s/p chemo

## 2011-09-27 NOTE — Progress Notes (Signed)
Discharge instructions reviewed with patient and husband.  Verbalizes understanding of instructions given re: meds, follow-up, and wound care.  Animal bite instruction sheet also given and reviewed. Denies any questions re: D/C instructions.  Patient discharged ambulatory with husband in stable condition.

## 2011-09-28 ENCOUNTER — Encounter (HOSPITAL_COMMUNITY): Payer: Self-pay

## 2011-09-28 ENCOUNTER — Encounter (HOSPITAL_COMMUNITY): Payer: Self-pay | Admitting: Orthopedic Surgery

## 2011-09-28 NOTE — Progress Notes (Signed)
Encounter addended by: Glennie Hawk, RN on: 09/28/2011  1:47 PM<BR>     Documentation filed: Charges VN

## 2011-09-29 ENCOUNTER — Encounter (HOSPITAL_COMMUNITY): Payer: Self-pay

## 2011-09-29 ENCOUNTER — Ambulatory Visit (HOSPITAL_COMMUNITY)
Admission: RE | Admit: 2011-09-29 | Discharge: 2011-09-29 | Disposition: A | Discharge status: Home / Self Care | Payer: Self-pay | Source: Ambulatory Visit | Attending: Internal Medicine | Admitting: Internal Medicine

## 2011-09-29 VITALS — BP 110/62 | HR 83 | Wt 118.0 lb

## 2011-09-29 DIAGNOSIS — Z8614 Personal history of Methicillin resistant Staphylococcus aureus infection: Secondary | ICD-10-CM | POA: Insufficient documentation

## 2011-09-29 DIAGNOSIS — Z79899 Other long term (current) drug therapy: Secondary | ICD-10-CM | POA: Insufficient documentation

## 2011-09-29 DIAGNOSIS — Z9010 Acquired absence of unspecified breast and nipple: Secondary | ICD-10-CM | POA: Insufficient documentation

## 2011-09-29 DIAGNOSIS — Z9221 Personal history of antineoplastic chemotherapy: Secondary | ICD-10-CM | POA: Insufficient documentation

## 2011-09-29 DIAGNOSIS — Z888 Allergy status to other drugs, medicaments and biological substances status: Secondary | ICD-10-CM | POA: Insufficient documentation

## 2011-09-29 DIAGNOSIS — F411 Generalized anxiety disorder: Secondary | ICD-10-CM | POA: Insufficient documentation

## 2011-09-29 DIAGNOSIS — C50919 Malignant neoplasm of unspecified site of unspecified female breast: Secondary | ICD-10-CM | POA: Insufficient documentation

## 2011-09-29 DIAGNOSIS — I498 Other specified cardiac arrhythmias: Secondary | ICD-10-CM | POA: Insufficient documentation

## 2011-09-29 DIAGNOSIS — I890 Lymphedema, not elsewhere classified: Secondary | ICD-10-CM | POA: Insufficient documentation

## 2011-09-29 DIAGNOSIS — I429 Cardiomyopathy, unspecified: Secondary | ICD-10-CM

## 2011-09-29 DIAGNOSIS — I428 Other cardiomyopathies: Secondary | ICD-10-CM | POA: Insufficient documentation

## 2011-09-29 DIAGNOSIS — T451X5A Adverse effect of antineoplastic and immunosuppressive drugs, initial encounter: Secondary | ICD-10-CM | POA: Insufficient documentation

## 2011-09-29 DIAGNOSIS — I427 Cardiomyopathy due to drug and external agent: Secondary | ICD-10-CM

## 2011-09-29 DIAGNOSIS — R569 Unspecified convulsions: Secondary | ICD-10-CM | POA: Insufficient documentation

## 2011-09-29 LAB — WOUND CULTURE

## 2011-09-29 MED ORDER — CARVEDILOL 3.125 MG PO TABS: 6.2500 mg | ORAL_TABLET | Freq: Two times a day (BID) | ORAL | Status: DC

## 2011-09-29 NOTE — Assessment & Plan Note (Addendum)
No signs of overt HF.  Will try to titrate carvedilol slowly over the next month.  Will begin with night time dose increase to 6.25 mg.  If tolerating after 4 days she will increase to 6.25 mg BID.  If she does well over the next week then she can titrate night time dose to 9.375 mg and if she tolerates this will increase to 9.375 mg BID.  She will call if she feels becomes dizzy or hypotensive.  She voiced understanding.  Am hopeful she will have recovery.    Patient seen and examined with Ulyess Blossom, PA-C. We discussed all aspects of the encounter. I agree with the assessment and plan as stated above. Doing well. No clinical HF. No volume overload. Have discussed titration of carvedilol at length. Will see her back in about 6 weeks and re-evaluate EF. Continue to hold Herceptin for now.

## 2011-09-29 NOTE — Patient Instructions (Signed)
Increase carvedilol 2 tabs (6.25 mg) at night and continue 1 tab in the morning.  After 4 days if you are tolerating this well then you can increase to 2 tabs twice daily.  After a week will increase night dose to 3 tabs nightly with 2 tabs in the morning for 1 week.  If you are tolerating then increase to 3 tabs twice daily.  Get up slowly from sitting to standing.  Follow up with Dr. Gala Romney in 1 month.

## 2011-09-29 NOTE — Progress Notes (Signed)
Referring Physician: Dr. [name] Primary Care: Dr. Warner Mccreedy Primary Cardiologist:  none   HPI: Crystal [name] is a 36 y.o. female with history of seizures (2 episodes with last one ~4 yrs ago), sinus tach and BRCA1 stage II invasive ductal carcinoma, ER/PR/HER-2 positive.  Status post bilateral mastectomy with right axillary lymph node dissection by Dr. Carolynne Edouard on 11/16/10.  She is status post carboplatin/docetaxol/trastuzumab x6, completed mid December 2012.  She is continuing on trastuzumab every 3 weeks for a total of 1 year.  She has also began radiation therapy and will begin antiestrogen therapy once radiation has been completed.  She has been having lymphedema in the right upper extremity and is being seen in the lymphedema clinic.    Echo 03/03/11: EF 60-65%, lateral S' >16 (top cut off) Echo 06/28/11 EF 50% s' 11.8 Echo 08/31/11 EF 45-50% s' 10.2  Here for 3 week follow up.  EF dropped slightly 45-50%, herceptin stopped 08/31/11.  Carvedilol started.  She feels ok today.  Last week she had a cat bite and her right hand became infected.  She required debridement and is on augmentin.  After last visit she did have increased SOB.  She also has multiple episodes a week with dizziness with standing.  No syncope or falls. She denies edema.  She is smoking half a pack of cigarettes a day (started about 2 months ago).  She says she has constipation since starting abx/pain meds, using miralax.  No chest pain/fever/chills. Increased appetite.     Review of Systems: All pertinent positives/negatives as in HPI, otherwise negative.   Past Medical History  Diagnosis Date  . Allergy   . Right breast ca dx'd 10/2010  . Breast cancer 12/03/10  . Status post chemotherapy     docetaxel/carboplatin/trastuzumab  . Anxiety   . Seizures     last seizure 2 years ago, diagnosed 3 years ago  . Hx MRSA infection 03/28/11    Skin    Current Outpatient Prescriptions  Medication Sig Dispense Refill  .  ALPRAZolam (XANAX) 0.5 MG tablet Take one of two pills three times daily as needed for anxiety  180 tablet  4  . amoxicillin-clavulanate (AUGMENTIN) 875-125 MG per tablet Take 1 tablet by mouth every 12 (twelve) hours.  20 tablet  0  . carvedilol (COREG) 3.125 MG tablet Take 1 tablet (3.125 mg total) by mouth 2 (two) times daily with a meal.  60 tablet  6  . cetirizine (ZYRTEC) 10 MG tablet Take 10 mg by mouth daily.      . cholecalciferol (VITAMIN D) 1000 UNITS tablet Take 2,500 Units by mouth daily.       . citalopram (CELEXA) 20 MG tablet Take 1 tablet (20 mg total) by mouth daily.  30 tablet  1  . diphenhydrAMINE (BENADRYL ALLERGY) 25 MG tablet Take 25-50 mg by mouth at bedtime as needed.       . Multiple Vitamins-Minerals (MULTIVITAMIN WITH MINERALS) tablet Take 1 tablet by mouth daily. (special formulation 2 capsules BID)      . naproxen sodium (ANAPROX) 220 MG tablet Take 220 mg by mouth 2 (two) times daily with a meal.      . oxyCODONE-acetaminophen (PERCOCET) 5-325 MG per tablet 1-2 tabs po q6 hours prn pain  40 tablet  0  . polyethylene glycol (MIRALAX / GLYCOLAX) packet Take 17 g by mouth daily as needed. constipation      . promethazine (PHENERGAN) 25 MG tablet Take 25 mg by mouth  every 6 (six) hours as needed. nausea      . ranitidine (ZANTAC) 150 MG capsule Take 150 mg by mouth 2 (two) times daily as needed. For heartburn      . tamoxifen (NOLVADEX) 20 MG tablet Take 20 mg by mouth daily.      . Trastuzumab (HERCEPTIN IV) Inject into the vein. Q3 weeks        Allergies  Allergen Reactions  . Phenytoin Nausea And Vomiting  . Amoxicillin-Pot Clavulanate Other (See Comments)    faint  . Thorazine (Chlorpromazine Hcl) Hives  . Amoxicillin-Pot Clavulanate Other (See Comments)    Syncope  . Ciprofloxacin Nausea And Vomiting  . Vancomycin Rash    Possible rash reported by MD    PHYSICAL EXAM: Filed Vitals:   09/29/11 0935  BP: 110/62  Pulse: 83  Weight: 118 lb (53.524 kg)    SpO2: 98%   General:  Thin, No respiratory difficulty HEENT: normal Neck: supple. no JVD. Carotids 2+ bilat; no bruits. No lymphadenopathy or thryomegaly appreciated. Cor: PMI nondisplaced. regular rhythm. No rubs, gallops or murmurs. Lungs: clear Abdomen: soft, nontender, nondistended. No hepatosplenomegaly. No bruits or masses. Good bowel sounds. Extremities: no cyanosis, clubbing, rash, edema.  Right hand wrapped with mild edema.  No erythema outside of bandage.  Neuro: alert & oriented x 3, cranial nerves grossly intact. moves all 4 extremities w/o difficulty. Affect pleasant.   ASSESSMENT & PLAN:

## 2011-09-30 ENCOUNTER — Ambulatory Visit (INDEPENDENT_AMBULATORY_CARE_PROVIDER_SITE_OTHER): Payer: Self-pay | Admitting: General Surgery

## 2011-09-30 ENCOUNTER — Encounter (INDEPENDENT_AMBULATORY_CARE_PROVIDER_SITE_OTHER): Payer: Self-pay | Admitting: General Surgery

## 2011-09-30 VITALS — BP 122/84 | HR 74 | Temp 97.7°F | Resp 16 | Ht 64.0 in | Wt 117.6 lb

## 2011-09-30 DIAGNOSIS — C50919 Malignant neoplasm of unspecified site of unspecified female breast: Secondary | ICD-10-CM

## 2011-09-30 NOTE — Patient Instructions (Signed)
Continue regular self exams  

## 2011-10-03 NOTE — Progress Notes (Signed)
Subjective:     Patient ID: Crystal [name], female   DOB: 1976-01-18, 36 y.o.   MRN: [id number]  HPI The patient is a 36 a white female who is one year out from bilateral mastectomies and a right axillary lymph node dissection for a T1cN1a right breast cancer. She is continuing to take tamoxifen and tolerating it well. Since her last visit she has had to stop Herceptin because of some heart issues. Also since her last visit she has been bitten on her right hand by a cat. The wounds got infected and she had to have surgery to open the compartments. Her right hand is bandaged up today with some swelling in the hand  Review of Systems  Constitutional: Negative.   HENT: Negative.   Eyes: Negative.   Respiratory: Negative.   Cardiovascular: Negative.   Gastrointestinal: Negative.   Genitourinary: Negative.   Musculoskeletal: Negative.   Skin: Negative.   Neurological: Negative.   Hematological: Negative.   Psychiatric/Behavioral: Negative.        Objective:   Physical Exam  Constitutional: She is oriented to person, place, and time. She appears well-developed and well-nourished.  HENT:  Head: Normocephalic and atraumatic.  Eyes: Conjunctivae and EOM are normal. Pupils are equal, round, and reactive to light.  Neck: Normal range of motion. Neck supple.  Cardiovascular: Normal rate, regular rhythm and normal heart sounds.   Pulmonary/Chest: Effort normal and breath sounds normal.       There is no palpable mass of either chest wall. Her mastectomy incisions have healed nicely. No palpable axillary supraclavicular or cervical lymphadenopathy Except for possibly one small palpable lymph node in the right axilla. This may be secondary to her recent cat bite.  Abdominal: Soft. Bowel sounds are normal. She exhibits no mass. There is no tenderness.  Musculoskeletal: Normal range of motion.       The right hand is bandaged with some swelling of the hand. No significant swelling proximal to the  hand  Lymphadenopathy:    She has no cervical adenopathy.  Neurological: She is alert and oriented to person, place, and time.  Skin: Skin is warm and dry.  Psychiatric: She has a normal mood and affect. Her behavior is normal.       Assessment:     The patient is one year status post bilateral mastectomies and right axillary lymph node dissection with a recent cat bite to right hand    Plan:     At this point she will continue to take tamoxifen. She will continue to follow with her hand surgeon. We will plan to see her back in 3 months. At that time she may need refitting for compression sleeve for the right arm

## 2011-10-04 ENCOUNTER — Other Ambulatory Visit: Payer: Self-pay | Admitting: *Deleted

## 2011-10-04 DIAGNOSIS — C50919 Malignant neoplasm of unspecified site of unspecified female breast: Secondary | ICD-10-CM

## 2011-10-05 ENCOUNTER — Other Ambulatory Visit (HOSPITAL_BASED_OUTPATIENT_CLINIC_OR_DEPARTMENT_OTHER): Payer: Self-pay | Admitting: Lab

## 2011-10-05 ENCOUNTER — Ambulatory Visit: Payer: Self-pay

## 2011-10-05 ENCOUNTER — Telehealth: Payer: Self-pay | Admitting: Oncology

## 2011-10-05 ENCOUNTER — Ambulatory Visit (HOSPITAL_BASED_OUTPATIENT_CLINIC_OR_DEPARTMENT_OTHER): Payer: Self-pay | Admitting: Oncology

## 2011-10-05 ENCOUNTER — Other Ambulatory Visit: Payer: Self-pay | Admitting: Oncology

## 2011-10-05 VITALS — BP 130/88 | HR 93 | Temp 97.4°F | Ht 64.0 in | Wt 118.7 lb

## 2011-10-05 DIAGNOSIS — C50919 Malignant neoplasm of unspecified site of unspecified female breast: Secondary | ICD-10-CM

## 2011-10-05 DIAGNOSIS — M79609 Pain in unspecified limb: Secondary | ICD-10-CM

## 2011-10-05 DIAGNOSIS — C773 Secondary and unspecified malignant neoplasm of axilla and upper limb lymph nodes: Secondary | ICD-10-CM

## 2011-10-05 LAB — CBC WITH DIFFERENTIAL/PLATELET
Basophils Absolute: 0 10*3/uL (ref 0.0–0.1)
EOS%: 3.1 % (ref 0.0–7.0)
MCH: 34.8 pg — ABNORMAL HIGH (ref 25.1–34.0)
MCHC: 34.9 g/dL (ref 31.5–36.0)
MCV: 99.9 fL (ref 79.5–101.0)
MONO%: 5.7 % (ref 0.0–14.0)
RBC: 3.83 10*6/uL (ref 3.70–5.45)
RDW: 14 % (ref 11.2–14.5)
nRBC: 0 % (ref 0–0)

## 2011-10-05 LAB — COMPREHENSIVE METABOLIC PANEL
Alkaline Phosphatase: 87 U/L (ref 39–117)
Glucose, Bld: 108 mg/dL — ABNORMAL HIGH (ref 70–99)
Sodium: 138 mEq/L (ref 135–145)
Total Bilirubin: 0.4 mg/dL (ref 0.3–1.2)
Total Protein: 5.9 g/dL — ABNORMAL LOW (ref 6.0–8.3)

## 2011-10-05 MED ORDER — OXYCODONE-ACETAMINOPHEN 5-325 MG PO TABS: ORAL_TABLET | ORAL | Status: DC

## 2011-10-05 NOTE — Progress Notes (Signed)
ID: Crystal [name]   DOB: Mar 04, 1976  MR#: [id number]  EXB#:[id number]  HISTORY OF PRESENT ILLNESS: The patient is a 36 year old [place] woman who noted a mass in her right breast and brought it to her primary care physician's attention June of 2012. She was set up for mammography at [facility], where an area of calcifications highly suspicious for carcinoma was biopsied. This was in the upper outer quadrant and it measured 1.7 cm mammographically and 1.8 cm by ultrasonography. The pathology report ([id number]) showed a high-grade invasive ductal carcinoma estrogen receptor positive at 64% progesterone receptor positive at 18%, and HER-2 amplified with a ratio by CISH of 5.24. MIB-1-1 was 65%. A second mass biopsied at the same time was also triple positive.  Given her multiple masses in the right breast, mastectomy was recommended. The patient actually opted for bilateral mastectomies and this was performed together with a right axillary lymph node dissection under Dr. Felicity Pellegrini on 11/16/2010. The final pathology [id number]) showed, on the left, no evidence of cancer. On the right the patient had a high-grade invasive ductal carcinoma measuring 1.9 cm, with negative margins, grade 3, involving 2 of 16 lymph nodes (stage IIB).  Her subsequent history is as detailed below.  INTERVAL HISTORY:  Crystal [name] returns today for followup of her breast cancer. The interval history is significant for having had a deep cat bite to her right hand. She developed erythema to above the elbow. She was evaluated by Cindee Salt for this and has had appropriate debridement and continues on Augmentin. Of course this was the surgical arm from her sentinel lymph node sampling so it is at increased risk of infection. The other development of has been her mild drop in her ejection fraction to 50%. This is being followed closely by Nicholes Mango. She was started on Coreg, but is not yet ready to resume Herceptin.  REVIEW OF  SYSTEMS: Aside from the above, she and Crystal [name] recently moved. She likes her new house. She is taking more walks around the neighborhood. She has quite a bit of pain related to the right hand and she was doing better on oxycodone and then hydrocodone so she requested a change in medication (discussed below). Sometimes she has palpitations, heartburn of and slight fatigue, but generally she has a negative detailed review of systems except as noted above.Marland Kitchen   PAST MEDICAL HISTORY: Past Medical History  Diagnosis Date  . Allergy   . Right breast ca dx'd 10/2010  . Breast cancer 12/03/10  . Status post chemotherapy     docetaxel/carboplatin/trastuzumab  . Anxiety   . Seizures     last seizure 2 years ago, diagnosed 3 years ago  . Hx MRSA infection 03/28/11    Skin  1. History of seizures. The patient has been thoroughly evaluated both here by Dr. [name] and at [facility] for this. Among many studies, she had an MRI of the brain on August 28th. This showed an incidental solitary small lesion in the left frontoparietal white matter. It was not felt to be suggestive of multiple sclerosis. A noncontrasted CT in August 2010 was negative. The patient has been off all seizure medications now for 2 months. There has been no evidence of seizure recurrence. 2. Complex psychology history (please refer to Dr. [name] note in the E-chart from August 29, 2009) with evidence of anxiety disorder, depression and psychosis. The patient is currently on no medications for this and denies any of these symptoms at present. 3. History  of migraines. 4. Remote history of multidrug abuse. 5. Status post wisdom teeth extraction.  History of MRSA skin infection   PAST SURGICAL HISTORY: Past Surgical History  Procedure Date  . Wisdom tooth extraction   . Breast surgery 11/16/2010    bilateral mastectomy+Ralnd,T1cN1a, Her2+,ERPR+  . Biopsy breast 10/14/2010    right needle core biopsy  . Portacath placement 11/16/2010     placement of left subclavian port  . I&d extremity 09/26/2011    Procedure: IRRIGATION AND DEBRIDEMENT EXTREMITY;  Surgeon: [name] Ribas, MD;  Location: [facility]S;  Service: Orthopedics;  Laterality: Right;  I&D right hand cat bite wound    FAMILY HISTORY Family History  Problem Relation Age of Onset  . Cancer Maternal Grandfather   The patient's mother is living, in her mid 92s, with no history of breast or ovarian cancer. The patient does not know her biological father. The patient has an older brother with bipolar disease, and according to the patient, borderline schizophrenia.   GYNECOLOGIC HISTORY: Menarche at age 26. She is G1, P1 with first pregnancy to term at age 59. The patient had significant pelvic pain recently and was evaluated with transvaginal and pelvic ultrasound on July 17th by Dr. Natale Milch. This showed normal uterine myometrium and endometrium with bilateral functional ovarian cysts. There were no worrisome features. 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 Her husband of 4 years, Crystal [name], works as a Administrator. In addition to them at home is the patient's son, Crystal [name], 17 years old attends [employer] in the tenth grade. Crystal [name], according to the patient, has significant nausea problems as indeed the patient did previously. The patient attends a local [address]. 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    ADVANCED DIRECTIVES:  HEALTH MAINTENANCE: History  Substance Use Topics  . Smoking status: Current Some Day Smoker -- 2.0 packs/day for 20 years    Types: Cigarettes    Last Attempt to Quit: 11/01/2010  . Smokeless tobacco: Never Used  . Alcohol Use: No     quit drinking 6 months     Colonoscopy:  PAP:  Bone density:  Lipid panel:  Allergies  Allergen Reactions  . Phenytoin Nausea And Vomiting  . Amoxicillin-Pot Clavulanate Other (See Comments)    faint   . Thorazine (Chlorpromazine Hcl) Hives  . Amoxicillin-Pot Clavulanate Other (See Comments)    Syncope  . Ciprofloxacin Nausea And Vomiting  . Vancomycin Rash    Possible rash reported by MD    Current Outpatient Prescriptions  Medication Sig Dispense Refill  . ALPRAZolam (XANAX) 0.5 MG tablet Take one of two pills three times daily as needed for anxiety  180 tablet  4  . amoxicillin-clavulanate (AUGMENTIN) 875-125 MG per tablet Take 1 tablet by mouth every 12 (twelve) hours.  20 tablet  0  . carvedilol (COREG) 3.125 MG tablet Take 2 tablets (6.25 mg total) by mouth 2 (two) times daily with a meal.  60 tablet  6  . cetirizine (ZYRTEC) 10 MG tablet Take 10 mg by mouth daily.      . cholecalciferol (VITAMIN D) 1000 UNITS tablet Take 2,500 Units by mouth daily.       . citalopram (CELEXA) 20 MG tablet Take 1 tablet (20 mg total) by mouth daily.  30 tablet  1  . diphenhydrAMINE (BENADRYL ALLERGY) 25 MG tablet Take 25-50 mg by mouth  at bedtime as needed.       . Hydrocodone-Acetaminophen (VICODIN) 5-300 MG TABS Take 2 tablets by mouth every 6 (six) hours as needed.      . Multiple Vitamins-Minerals (MULTIVITAMIN WITH MINERALS) tablet Take 1 tablet by mouth daily. (special formulation 2 capsules BID)      . naproxen sodium (ANAPROX) 220 MG tablet Take 220 mg by mouth 2 (two) times daily with a meal.      . polyethylene glycol (MIRALAX / GLYCOLAX) packet Take 17 g by mouth daily as needed. constipation      . promethazine (PHENERGAN) 25 MG tablet Take 25 mg by mouth every 6 (six) hours as needed. nausea      . ranitidine (ZANTAC) 150 MG capsule Take 150 mg by mouth 2 (two) times daily as needed. For heartburn      . tamoxifen (NOLVADEX) 20 MG tablet Take 20 mg by mouth daily.      . Trastuzumab (HERCEPTIN IV) Inject into the vein. Q3 weeks      . oxyCODONE-acetaminophen (PERCOCET) 5-325 MG per tablet 1-2 tabs po q6 hours prn pain  40 tablet  0    OBJECTIVE: Young white woman with wounds in her  right hand area Filed Vitals:   10/05/11 0829  BP: 130/88  Pulse: 93  Temp: 97.4 F (36.3 C)     Body mass index is 20.37 kg/(m^2).    ECOG FS: 2  Filed Weights   10/05/11 0829  Weight: 118 lb 11.2 oz (53.842 kg)   Physical Exam: HEENT:  Sclerae anicteric, conjunctivae pink.  Oropharynx clear.   Nodes:  No cervical, supraclavicular, or axillary lymphadenopathy palpated.  Breast Exam:  Status post bilateral mastectomies. No evidence of local recurrence Lungs:  Clear to auscultation bilaterally.  No crackles, rhonchi, or wheezes.   Heart:  Regular rate and rhythm.   Abdomen:  Soft, thin, nontender.  Positive bowel sounds.  No organomegaly or masses palpated.   Musculoskeletal:  The wounds in the right hand area are healing nicely by secondary intention. The base is pink. There is no erythema or edema in the hand wrist or arm. Neuro:  Nonfocal. Alert and oriented x3 with pleasant affect.    LAB RESULTS: Lab Results  Component Value Date   WBC 5.4 10/05/2011   NEUTROABS 3.8 10/05/2011   HGB 13.3 10/05/2011   HCT 38.2 10/05/2011   MCV 99.9 10/05/2011   PLT 210 10/05/2011      Chemistry      Component Value Date/Time   NA 139 09/26/2011 0257   K 3.6 09/26/2011 0257   CL 105 09/26/2011 0257   CO2 22 09/26/2011 0257   BUN 14 09/26/2011 0257   CREATININE 0.88 09/26/2011 0257      Component Value Date/Time   CALCIUM 8.4 09/26/2011 0257   ALKPHOS 120* 09/14/2011 0903   AST 23 09/14/2011 0903   ALT 19 09/14/2011 0903   BILITOT 0.4 09/14/2011 0903       Lab Results  Component Value Date   LABCA2 16 08/24/2011     STUDIES:  08/31/2011 Transthoracic Echocardiography  Patient: Crystal [name], Crystal [name] MR #: [id number] Study Date: 08/31/2011 Gender: F Age: 34 Height: 162.6cm Weight: 51.8kg BSA: 1.26m^2 Pt. Status: Room:  PERFORMING [place], [facility], Amy SONOGRAPHER Crystal [name], RDCS cc:  ------------------------------------------------------------ LV EF: 50% -  55%  ------------------------------------------------------------ Indications: V58.11 Chemotherapy Evaluation.  ------------------------------------------------------------ History: PMH: No prior cardiac history.  ------------------------------------------------------------ Study Conclusions  - Left ventricle:  The cavity size was normal. Wall thickness was normal. Systolic function was low normal mildly reduced. The estimated ejection fraction was 50%. Wall motion was normal; there were no regional wall motion abnormalities. Left ventricular diastolic function parameters were normal. - Aortic valve: There was no stenosis. - Mitral valve: Trivial regurgitation. - Right ventricle: The cavity size was normal. Systolic function was normal. - Tricuspid valve: Peak RV-RA gradient: 24mm Hg (S). - Pulmonary arteries: PA peak pressure: 29mm Hg (S). - Inferior vena cava: The vessel was normal in size; the respirophasic diameter changes were in the normal range (= 50%); findings are consistent with normal central venous pressure. Impressions:  - Normal LV size with low normal to mildly reduced systolic function, EF 50%. Normal diastolic function. Normal RV size and systolic function. Normal atria, no significant valvular abnormalities. Transthoracic echocardiography. M-mode, complete 2D, spectral Doppler, and color Doppler. Height: Height: 162.6cm. Height: 64in. Weight: Weight: 51.8kg. Weight: 114lb. Body mass index: BMI: 19.6kg/m^2. Body surface area: BSA: 1.76m^2. Blood pressure: 110/66. Patient status: Outpatient. Location: Echo laboratory.     ASSESSMENT: 36 year old [place] woman, known to be BRCA1 and 2 negative,   (1) status post bilateral mastectomies May of 2012 for a right-sided T1c N1 (stage II) grade 3 invasive ductal carcinoma,estrogen and progesterone receptor positive, HER-2 amplified, with an MIB-1-1 of 85%.   (2) status post carboplatin/docetaxol/trastuzumab  x6, completed mid December 2012   (3)on trastuzumab every 3 weeks, currently on hold   (4) status post radiation therapy, completed 07/12/2011  (5)  On tamoxifen since April 2013   PLAN: There is no evidence of disease recurrence. We will resume the trastuzumab when her of ejection fraction improves. We're continuing the tamoxifen at least for another year.  She is interested in receiving goserelin of 2 take care of the birth control issue, and we can certainly start that at the next visit if she wishes. We could do that for 1 year and then reassess. She understands that at her age it is very common for her periods to resume, but they can be "hidden" by the tamoxifen and she does need to do something for birth control, though the possibility of pregnancy is remote at this point.  Her right hand infection seems to have been successfully controlled and she will complete her antibiotics as prescribed. Today I wrote her for 40 oxycodone/APAP to take on a twice a day basis as needed. I do not anticipate she will need a more pain medicines after this prescription. She will see me again in 2 months. She knows to call for any problems that may develop before then. This case was reviewed with Dr. [name] and per Dr. Prescott Gum recommendation, we will hold Kamaryn's trastuzumab at this time. She scheduled to followup with Dr. Gala Romney on May 29, and will see Dr. [name] the following week to discuss her treatment plan.  In the meanwhile, she will continue on her tamoxifen which she actually seems to be tolerating well. We are also refilling her Celexa today.  With regards to Irean's GI issues, I am referring her to [facility] for further evaluation of possible IBS with associated diarrhea, and occasional constipation. She would like to try some probiotics, and she can certainly try that between now and her visit in their office. I have asked her, however, to discontinue the probiotics if her symptoms  worsen.  Patient voices understanding and agreement with our plan. She will call any changes or problems.    Seydina Holliman C  10/05/2011    

## 2011-10-05 NOTE — Telephone Encounter (Signed)
lmonvm adviisng the pt of her next f/u appt for august.

## 2011-10-07 ENCOUNTER — Other Ambulatory Visit (HOSPITAL_COMMUNITY): Payer: Self-pay | Admitting: Adult Health

## 2011-10-07 DIAGNOSIS — C50919 Malignant neoplasm of unspecified site of unspecified female breast: Secondary | ICD-10-CM

## 2011-10-14 ENCOUNTER — Emergency Department (HOSPITAL_COMMUNITY): Payer: Self-pay

## 2011-10-14 ENCOUNTER — Encounter (HOSPITAL_COMMUNITY): Payer: Self-pay

## 2011-10-14 ENCOUNTER — Emergency Department (HOSPITAL_COMMUNITY)
Admission: EM | Admit: 2011-10-14 | Discharge: 2011-10-14 | Disposition: A | Discharge status: Home / Self Care | Payer: Self-pay | Attending: Emergency Medicine | Admitting: Emergency Medicine

## 2011-10-14 ENCOUNTER — Ambulatory Visit (HOSPITAL_COMMUNITY): Payer: Self-pay

## 2011-10-14 DIAGNOSIS — F411 Generalized anxiety disorder: Secondary | ICD-10-CM | POA: Insufficient documentation

## 2011-10-14 DIAGNOSIS — Z853 Personal history of malignant neoplasm of breast: Secondary | ICD-10-CM | POA: Insufficient documentation

## 2011-10-14 DIAGNOSIS — R0989 Other specified symptoms and signs involving the circulatory and respiratory systems: Secondary | ICD-10-CM | POA: Insufficient documentation

## 2011-10-14 DIAGNOSIS — R0602 Shortness of breath: Secondary | ICD-10-CM | POA: Insufficient documentation

## 2011-10-14 DIAGNOSIS — R0600 Dyspnea, unspecified: Secondary | ICD-10-CM

## 2011-10-14 LAB — BASIC METABOLIC PANEL
BUN: 16 mg/dL (ref 6–23)
Calcium: 9 mg/dL (ref 8.4–10.5)
Creatinine, Ser: 0.77 mg/dL (ref 0.50–1.10)
GFR calc non Af Amer: >90 mL/min (ref >90)
Glucose, Bld: 88 mg/dL (ref 70–99)
Sodium: 139 mEq/L (ref 135–145)

## 2011-10-14 LAB — CBC
MCH: 33.8 pg (ref 26.0–34.0)
MCV: 96.3 fL (ref 78.0–100.0)
Platelets: 258 10*3/uL (ref 150–400)
RBC: 4.05 MIL/uL (ref 3.87–5.11)

## 2011-10-14 LAB — DIFFERENTIAL
Eosinophils Absolute: 0.2 10*3/uL (ref 0.0–0.7)
Eosinophils Relative: 2 % (ref 0–5)
Lymphs Abs: 1.8 10*3/uL (ref 0.7–4.0)
Monocytes Absolute: 0.5 10*3/uL (ref 0.1–1.0)
Monocytes Relative: 6 % (ref 3–12)

## 2011-10-14 MED ORDER — ALBUTEROL SULFATE HFA 108 (90 BASE) MCG/ACT IN AERS
2.0000 | INHALATION_SPRAY | Freq: Once | RESPIRATORY_TRACT | Status: AC
  Administered 2011-10-14: 2 via RESPIRATORY_TRACT
  Filled 2011-10-14: qty 6.7

## 2011-10-14 MED ORDER — HEPARIN SOD (PORK) LOCK FLUSH 100 UNIT/ML IV SOLN
INTRAVENOUS | Status: AC
  Administered 2011-10-14: 500 [IU]
  Filled 2011-10-14: qty 5

## 2011-10-14 NOTE — ED Notes (Signed)
Patient reports that she has had SOB since chemo intermittently and in the past week has increased. Patient has a history of panic attacks.

## 2011-10-14 NOTE — ED Provider Notes (Addendum)
History     CSN: [id number]  Arrival date & time 10/14/11  1345   First MD Initiated Contact with Patient 10/14/11 1459      Chief Complaint  Patient presents with  . Shortness of Breath    (Consider location/radiation/quality/duration/timing/severity/associated sxs/prior treatment) HPI Comments: Patient presents complaining of worsening shortness of breath over the last few days.  She notes that the more she exerts herself more short of breath she gets.  Patient denies chest pain.  She does have a history of breast cancer and is currently receiving chemotherapy with Herceptin and tamoxifen.  No prior history of DVTs or PEs.  Patient does not she's had problems with some increased heart rate for which her cardiologist has been increasing her Coreg.  Patient also feels like she just has a tightness in her chest and some possible cold symptoms but no fevers.  She has tried some over-the-counter cold medicines without relief.  Patient denies prior history of asthma although she does smoke.  Patient is a 36 y.o. female presenting with shortness of breath. The history is provided by the patient.  Shortness of Breath  Associated symptoms include shortness of breath. Pertinent negatives include no chest pain, no fever and no cough.    Past Medical History  Diagnosis Date  . Allergy   . Right breast ca dx'd 10/2010  . Breast cancer 12/03/10  . Status post chemotherapy     docetaxel/carboplatin/trastuzumab  . Anxiety   . Seizures     last seizure 2 years ago, diagnosed 3 years ago  . Hx MRSA infection 03/28/11    Skin    Past Surgical History  Procedure Date  . Wisdom tooth extraction   . Breast surgery 11/16/2010    bilateral mastectomy+Ralnd,T1cN1a, Her2+,ERPR+  . Biopsy breast 10/14/2010    right needle core biopsy  . Portacath placement 11/16/2010    placement of left subclavian port  . I&d extremity 09/26/2011    Procedure: IRRIGATION AND DEBRIDEMENT EXTREMITY;  Surgeon: [name] Ribas, MD;  Location: [facility];  Service: Orthopedics;  Laterality: Right;  I&D right hand cat bite wound    Family History  Problem Relation Age of Onset  . Cancer Maternal Grandfather     History  Substance Use Topics  . Smoking status: Current Some Day Smoker -- 2.0 packs/day for 20 years    Types: Cigarettes    Last Attempt to Quit: 11/01/2010  . Smokeless tobacco: Never Used  . Alcohol Use: No     quit drinking 6 months    OB History    Grav Para Term Preterm Abortions TAB SAB Ect Mult Living   1 1 1  0 0 0 0 0 0 0      Review of Systems  Constitutional: Negative.  Negative for fever and chills.  HENT: Positive for congestion.   Eyes: Negative.   Respiratory: Positive for chest tightness and shortness of breath. Negative for cough.   Cardiovascular: Negative.  Negative for chest pain.  Gastrointestinal: Negative.  Negative for nausea, vomiting, abdominal pain and diarrhea.  Genitourinary: Negative.   Musculoskeletal: Negative.  Negative for back pain.  Skin: Negative.  Negative for color change and rash.  Neurological: Negative.  Negative for syncope and headaches.  Hematological: Negative.  Negative for adenopathy.  Psychiatric/Behavioral: Negative.  Negative for confusion.  All other systems reviewed and are negative.    Allergies  Phenytoin; Thorazine; Ciprofloxacin; and Vancomycin  Home Medications   Current Outpatient Rx  Name Route Sig Dispense Refill  . ALPRAZOLAM 0.5 MG PO TABS  Take one of two pills three times daily as needed for anxiety 180 tablet 4  . CARVEDILOL 3.125 MG PO TABS Oral Take 6.25-9.375 mg by mouth See admin instructions. Take 2 tablets every morning and 3 tablets every evening.    Marland Kitchen CETIRIZINE HCL 10 MG PO TABS Oral Take 10 mg by mouth daily.    Marland Kitchen CETIRIZINE-PSEUDOEPHEDRINE ER 5-120 MG PO TB12 Oral Take 1 tablet by mouth once.    Marland Kitchen VITAMIN D 1000 UNITS PO TABS Oral Take 2,500 Units by mouth daily.     Marland Kitchen CITALOPRAM HYDROBROMIDE 20 MG PO  TABS Oral Take 1 tablet (20 mg total) by mouth daily. 30 tablet 1  . GUAIFENESIN-DM 100-10 MG/5ML PO SYRP Oral Take 5 mLs by mouth daily as needed. Cough and congestion.    Marland Kitchen POLYETHYLENE GLYCOL 3350 PO PACK Oral Take 17 g by mouth daily as needed. constipation    . PROMETHAZINE HCL 25 MG PO TABS Oral Take 25 mg by mouth every 6 (six) hours as needed. nausea    . RANITIDINE HCL 150 MG PO CAPS Oral Take 150 mg by mouth 2 (two) times daily as needed. For heartburn    . TAMOXIFEN CITRATE 20 MG PO TABS Oral Take 20 mg by mouth daily.    Marland Kitchen HERCEPTIN IV Intravenous Inject into the vein. Q3 weeks      BP 114/77  Pulse 122  Temp 97.4 F (36.3 C) (Oral)  Resp 23  SpO2 100%  Physical Exam  Nursing note and vitals reviewed. Constitutional: She is oriented to person, place, and time. She appears well-developed and well-nourished.  Non-toxic appearance. She does not have a sickly appearance.  HENT:  Head: Normocephalic and atraumatic.  Eyes: Conjunctivae, EOM and lids are normal. Pupils are equal, round, and reactive to light. No scleral icterus.  Neck: Trachea normal and normal range of motion. Neck supple.  Cardiovascular: Normal rate, regular rhythm and normal heart sounds.   Pulmonary/Chest: Effort normal and breath sounds normal. No respiratory distress. She has no wheezes. She has no rales. She exhibits no tenderness.  Abdominal: Soft. Normal appearance. There is no tenderness. There is no rebound, no guarding and no CVA tenderness.  Musculoskeletal: Normal range of motion. She exhibits no edema.  Neurological: She is alert and oriented to person, place, and time. She has normal strength.  Skin: Skin is warm, dry and intact. No rash noted.  Psychiatric: She has a normal mood and affect. Her behavior is normal. Judgment and thought content normal.    ED Course  Procedures (including critical care time)  Results for orders placed during the hospital encounter of 10/14/11  CBC       Component Value Range   WBC 7.8  4.0 - 10.5 K/uL   RBC 4.05  3.87 - 5.11 MIL/uL   Hemoglobin 13.7  12.0 - 15.0 g/dL   HCT 82.9  56.2 - 13.0 %   MCV 96.3  78.0 - 100.0 fL   MCH 33.8  26.0 - 34.0 pg   MCHC 35.1  30.0 - 36.0 g/dL   RDW 86.5  78.4 - 69.6 %   Platelets 258  150 - 400 K/uL  DIFFERENTIAL      Component Value Range   Neutrophils Relative 68  43 - 77 %   Neutro Abs 5.3  1.7 - 7.7 K/uL   Lymphocytes Relative 24  12 - 46 %  Lymphs Abs 1.8  0.7 - 4.0 K/uL   Monocytes Relative 6  3 - 12 %   Monocytes Absolute 0.5  0.1 - 1.0 K/uL   Eosinophils Relative 2  0 - 5 %   Eosinophils Absolute 0.2  0.0 - 0.7 K/uL   Basophils Relative 0  0 - 1 %   Basophils Absolute 0.0  0.0 - 0.1 K/uL  TROPONIN I      Component Value Range   Troponin I <0.30  <0.30 ng/mL   Dg Chest 2 View  10/14/2011  *RADIOLOGY REPORT*  Clinical Data: Progressive shortness of breath.  History of breast cancer.  CHEST - 2 VIEW  Comparison: 07/13/2011.  Findings: The heart size and mediastinal contours are normal. The lungs are clear. There is no pleural effusion or pneumothorax. No acute osseous findings are identified.  Left subclavian Port-A-Cath is unchanged.  There are stable surgical clips in the right axilla status post bilateral mastectomy.  IMPRESSION: Stable examination.  No active cardiopulmonary process.  Original Report Authenticated By: Gerrianne Scale, M.D.       Date: 10/14/2011  Rate: 115  Rhythm: sinus tachycardia  QRS Axis: normal  Intervals: normal  ST/T Wave abnormalities: normal  Conduction Disutrbances:none  Narrative Interpretation:   Old EKG Reviewed: unchanged from 07-13-11   Repeat EKG for decreased HR:  Date: 10/14/2011  Rate: 65  Rhythm: normal sinus rhythm  QRS Axis: normal  Intervals: normal  ST/T Wave abnormalities: normal  Conduction Disutrbances:none  Narrative Interpretation:   Old EKG Reviewed: changes noted from earlier today with decreased HR    MDM  Patient  with some symptoms of congestion and cold so a chest x-ray will be ordered to evaluate for possible pneumonia.  Patient denies any history of fevers though.  Other considerations to include pulmonary embolus given patient's cancer history.  If no other acute etiologies found such as pericarditis, myocarditis or other infectious etiology a CT angio of the patient's chest will likely be needed.        Nat Christen, MD 10/14/11 1524  Nat Christen, MD 10/14/11 1627  Nat Christen, MD 10/14/11 1659  5:31 PM Patient wants to decline the CT angiogram at this time.  She understands the seriousness of pulmonary embolus as a diagnosis.  She understands that it could be life-threatening.  I have encouraged her to allow [place] to perform this study but she now prefers to go home at this time.  She is requesting something for the breathing I have recommended that we could try 2 puffs of albuterol to see if the sister although it seems unlikely.  This does not appear to be cardiac in nature as the patient has no changes on her EKG and a normal troponin.  Patient's heart rate has notably decreased to the 60s and 70s without further intervention here.  As patient does explain source of breath it seems unlikely to be GI in origin such as GERD or gastritis.  Patient does have history of anxiety and does appear anxious at this time but did not appear anxious earlier so it is unclear if anxiety is playing a role in this patient's shortness of breath.  She does note that she will followup with Dr. Adella Hare her cardiologist for further evaluation next week.  I encouraged her to return for further evaluation should she choose.  Nat Christen, MD 10/14/11 1733  Pt felt some relief with albuterol.  Still wants to go home.  Knows to return if things worsen or she changes her mind.  Will f/u with her doctors next week.  Pt will also try some decongestants since she feels like she may just have a bad cold.     Nat Christen, MD 10/14/11 [phone]

## 2011-10-14 NOTE — Progress Notes (Signed)
Pt listed as self pay with no insurance coverage Pt confirms she is self pay [place] resident.  CM and [facility] community liaison spoke with her Pt offered [facility] services to assist with finding a [place] self pay provider  

## 2011-10-14 NOTE — Discharge Instructions (Signed)
Please return to the emergency department for further evaluation should you change your mind.  We did consider the possibility that you could have a pulmonary embolus which is a blood clot in your lungs which can be a very serious condition.  If you have worsening difficulty breathing or chest pain you should return to the emergency department for evaluation.  Please followup with your primary care physician as well as Dr. Adella Hare.  Shortness of Breath Shortness of breath (dyspnea) is the feeling of uneasy breathing. Shortness of breath does not always mean that there is a life-threatening illness. However, shortness of breath requires immediate medical care. CAUSES  Causes for shortness of breath include:  Not enough oxygen in the air (as with high altitudes or with a smoke-filled room).   Short-term (acute) lung disease, including:   Infections such as pneumonia.   Fluid in the lungs, such as heart failure.   A blood clot in the lungs (pulmonary embolism).   Lasting (chronic) lung diseases.   Heart disease (heart attack, angina, heart failure, and others).   Low red blood cells (anemia).   Poor physical fitness. This can cause shortness of breath when you exercise.   Chest or back injuries or stiffness.   Being overweight (obese).   Anxiety. This can make you feel like you are not getting enough air.  DIAGNOSIS  Serious medical problems can usually be found during your physical exam. Many tests may also be done to determine why you are having shortness of breath. Tests include:  Chest X-rays.   Lung function tests.   Blood tests.   Electrocardiography.   Exercise testing.   A cardiac echo.   Imaging scans.  Your caregiver may not be able to find a cause for your shortness of breath after your exam. In this case, it is important to have a follow-up exam with your caregiver as directed.  HOME CARE INSTRUCTIONS   Do not smoke. Smoking is a common cause of shortness  of breath. Ask for help to stop smoking.   Avoid being around chemicals that may bother your breathing (paint fumes, dust).   Rest as needed. Slowly resume your usual activities.   If medicines were prescribed, take them as directed for the full length of time directed. This includes oxygen and any inhaled medicines.   Follow up with your caregiver as directed. Waiting to do so or failure to follow up could result in worsening of your condition and possible disability or death.   Be sure you understand what to do or who to call if your shortness of breath worsens.  SEEK MEDICAL CARE IF:   Your condition does not improve in the time expected.   You have a hard time doing your normal activities even with rest.   You have any side effects or problems with the medicines prescribed.   You develop any new symptoms.  SEEK IMMEDIATE MEDICAL CARE IF:   Your shortness of breath is getting worse.   You feel lightheaded, faint, or develop a cough not controlled with medicines.   You start coughing up blood.   You have pain with breathing.   You have chest pain or pain in your arms, shoulders, or abdomen.   You have a fever.   You are unable to walk up stairs or exercise the way you normally do.   Your symptoms are getting worse.  Document Released: 01/11/2001 Document Revised: 04/07/2011 Document Reviewed: 08/29/2007 [facility] Patient Information 2012 [place], [place].

## 2011-10-14 NOTE — ED Notes (Signed)
Pt given albuterol inhaler, instructed and demonstrated how to use, given discharge instructions, awaiting deaccess of port, then will escort to discharge window.

## 2011-10-17 ENCOUNTER — Ambulatory Visit (HOSPITAL_COMMUNITY)
Admission: RE | Admit: 2011-10-17 | Discharge: 2011-10-17 | Disposition: A | Discharge status: Home / Self Care | Payer: Self-pay | Source: Ambulatory Visit | Attending: Internal Medicine | Admitting: Internal Medicine

## 2011-10-17 ENCOUNTER — Telehealth (HOSPITAL_COMMUNITY): Payer: Self-pay | Admitting: *Deleted

## 2011-10-17 ENCOUNTER — Encounter (HOSPITAL_COMMUNITY): Payer: Self-pay

## 2011-10-17 ENCOUNTER — Telehealth (HOSPITAL_COMMUNITY): Payer: Self-pay | Admitting: Internal Medicine

## 2011-10-17 ENCOUNTER — Telehealth: Payer: Self-pay

## 2011-10-17 VITALS — BP 110/80 | HR 66 | Ht 64.0 in | Wt 113.4 lb

## 2011-10-17 DIAGNOSIS — I427 Cardiomyopathy due to drug and external agent: Secondary | ICD-10-CM

## 2011-10-17 DIAGNOSIS — R05 Cough: Secondary | ICD-10-CM

## 2011-10-17 DIAGNOSIS — R0609 Other forms of dyspnea: Secondary | ICD-10-CM

## 2011-10-17 DIAGNOSIS — R0602 Shortness of breath: Secondary | ICD-10-CM | POA: Insufficient documentation

## 2011-10-17 DIAGNOSIS — I429 Cardiomyopathy, unspecified: Secondary | ICD-10-CM

## 2011-10-17 DIAGNOSIS — T451X5A Adverse effect of antineoplastic and immunosuppressive drugs, initial encounter: Secondary | ICD-10-CM

## 2011-10-17 DIAGNOSIS — R0600 Dyspnea, unspecified: Secondary | ICD-10-CM

## 2011-10-17 MED ORDER — AZITHROMYCIN 250 MG PO TABS: ORAL_TABLET | ORAL | Status: DC

## 2011-10-17 MED ORDER — IOHEXOL 350 MG/ML SOLN
50.0000 mL | Freq: Once | INTRAVENOUS | Status: AC | PRN
  Administered 2011-10-17: 50 mL via INTRAVENOUS

## 2011-10-17 MED ORDER — AZITHROMYCIN 250 MG PO TABS: ORAL_TABLET | ORAL | Status: AC

## 2011-10-17 NOTE — Telephone Encounter (Signed)
[name] called today in regards to his wife. She has been having SOB since last Friday, she went to the ER, was given Albuterol, and is getting no relief. They would like a call back asap. Thanks.

## 2011-10-17 NOTE — Patient Instructions (Addendum)
Non-Cardiac CT scanning, (CAT scanning), is a noninvasive, special x-ray that produces cross-sectional images of the body using x-rays and a computer. CT scans help physicians diagnose and treat medical conditions. For some CT exams, a contrast material is used to enhance visibility in the area of the body being studied. CT scans provide greater clarity and reveal more details than regular x-ray exams.  TODAY AT 2 PM   Continue Carvedilol 2 tabs Twice daily   Keep appointments as scheduled for echocardiogram and follow up on 10/31/11

## 2011-10-17 NOTE — Progress Notes (Signed)
Referring Physician: Dr. [name] Primary Care: Dr. Warner Mccreedy Primary Cardiologist:  none   HPI: Mrs. Ignasiak is a 36 y.o. female with history of seizures (2 episodes with last one ~4 yrs ago), sinus tach and BRCA1 stage II invasive ductal carcinoma, ER/PR/HER-2 positive.  Status post bilateral mastectomy with right axillary lymph node dissection by Dr. Carolynne Edouard on 11/16/10.  She is status post carboplatin/docetaxol/trastuzumab x6, completed mid December 2012.  She is continuing on trastuzumab every 3 weeks for a total of 1 year.  She has also began radiation therapy and will begin antiestrogen therapy once radiation has been completed.  She has been having lymphedema in the right upper extremity and is being seen in the lymphedema clinic.    Echo 03/03/11: EF 60-65%, lateral S' >16 (top cut off) Echo 06/28/11 EF 50% s' 11.8 Echo 08/31/11 EF 45-50% s' 10.2  Here for a work in visit.  She woke up on Friday, 6/14 with cough and had shortness of breath.  Not relieved by cough medicine from [facility].  Went to the ER.  CXR stable.  Given albuterol inhaler with some relief.  They wanted to get a CTA but she got nervous and left.  She has finished her albuterol inhaler that appears to help for a short amount of time.  She ran out and then was taking albuterol and symbicort from her mother in law.  She continues to have cough, SOB with minimal activity and orthopnea.  She denies edema.  She has been increasing carvedilol at home and is now on 6.25 mg BID, she is suppose to go to 6.25/9.735 today.  7 days she became orthostatic and fell on ground, bruised eye.  Twice last week she was dizzy enough she had to sit down.  Continues to smoke.  Appetite decreasing.     Review of Systems: All pertinent positives/negatives as in HPI, otherwise negative.   Past Medical History  Diagnosis Date  . Allergy   . Right breast ca dx'd 10/2010  . Breast cancer 12/03/10  . Status post chemotherapy    docetaxel/carboplatin/trastuzumab  . Anxiety   . Seizures     last seizure 2 years ago, diagnosed 3 years ago  . Hx MRSA infection 03/28/11    Skin    Current Outpatient Prescriptions  Medication Sig Dispense Refill  . ALBUTEROL SULFATE IN Inhale into the lungs 4 (four) times daily as needed.      . ALPRAZolam (XANAX) 0.5 MG tablet Take one of two pills three times daily as needed for anxiety  180 tablet  4  . budesonide-formoterol (SYMBICORT) 80-4.5 MCG/ACT inhaler Inhale 1 puff into the lungs daily.      . carvedilol (COREG) 3.125 MG tablet Take 6.25-9.375 mg by mouth See admin instructions. Take 2 tablets every morning and 3 tablets every evening.      . cetirizine-pseudoephedrine (ZYRTEC-D) 5-120 MG per tablet Take 1 tablet by mouth once.      . citalopram (CELEXA) 20 MG tablet Take 1 tablet (20 mg total) by mouth daily.  30 tablet  1  . polyethylene glycol (MIRALAX / GLYCOLAX) packet Take 17 g by mouth daily as needed. constipation      . promethazine (PHENERGAN) 25 MG tablet Take 25 mg by mouth every 6 (six) hours as needed. nausea      . ranitidine (ZANTAC) 150 MG capsule Take 150 mg by mouth 2 (two) times daily as needed. For heartburn      . tamoxifen (NOLVADEX)  20 MG tablet Take 20 mg by mouth daily.      Marland Kitchen DISCONTD: carvedilol (COREG) 3.125 MG tablet Take 2 tablets (6.25 mg total) by mouth 2 (two) times daily with a meal.  60 tablet  6    Allergies  Allergen Reactions  . Phenytoin Nausea And Vomiting  . Thorazine (Chlorpromazine Hcl) Hives  . Ciprofloxacin Nausea And Vomiting  . Vancomycin Rash    Possible rash reported by MD    PHYSICAL EXAM: Filed Vitals:   10/17/11 1141  BP: 110/80  Pulse: 66  Height: 5\' 4"  (1.626 m)  Weight: 113 lb 6.4 oz (51.438 kg)  SpO2: 100%   General:  Thin, No respiratory difficulty HEENT: normal Neck: supple. no JVD. Carotids 2+ bilat; no bruits. No lymphadenopathy or thryomegaly appreciated. Cor: PMI nondisplaced. regular rhythm. No  rubs, gallops or murmurs. Lungs: clear Abdomen: soft, nontender, nondistended. No hepatosplenomegaly. No bruits or masses. Good bowel sounds. Extremities: no cyanosis, clubbing, rash, edema.  Right wrist with healing wound.    Neuro: alert & oriented x 3, cranial nerves grossly intact. moves all 4 extremities w/o difficulty. Affect pleasant.   ASSESSMENT & PLAN:

## 2011-10-17 NOTE — Telephone Encounter (Signed)
Spoke w/pt's husband he states pt has having a hard time breathing since Fri was in ER over the weekend but "freaked out" when they mentioned having a CT scan and left, she has continued to have SOB adn is unable to lay flat at night, appt scheduled for today

## 2011-10-17 NOTE — Telephone Encounter (Signed)
Husband stated that they were headed to heart dr immediately and will call us with status

## 2011-10-17 NOTE — Addendum Note (Signed)
Addended by: Noralee Space on: 10/17/2011 05:30 PM   Modules accepted: Orders

## 2011-10-17 NOTE — Telephone Encounter (Signed)
Per Dr Gala Romney CT Scan ok, will try pt on Z-pak, pt is aware and agreeable also recommended OTC Robitussin for cough

## 2011-10-17 NOTE — Telephone Encounter (Signed)
PATIENT WENT TO THE HOSPITAL Friday, HAVING A HARD TIME BREATHING.  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  THEY GAVE HER ABUTEROL INHALER.  IT WORKED FINE WHEN SHE GOT IT, BUT SHE HAD NEVER HAD ONE BEFORE, KEPT IT IN HER POCKET AND NOW THE INHALER READS ZERO AND NOTHING IS COMING. OUT.  SHE HAS STARTED COUGHING AGAIN AND IS HAVING A HARD TIME BREATHING.  CAN WE PLEASE CALL HER IN AN EMERGENCY INHALER.

## 2011-10-19 ENCOUNTER — Ambulatory Visit: Payer: Self-pay | Admitting: Gastroenterology

## 2011-10-24 ENCOUNTER — Encounter (HOSPITAL_COMMUNITY): Payer: Self-pay

## 2011-10-26 ENCOUNTER — Ambulatory Visit (HOSPITAL_BASED_OUTPATIENT_CLINIC_OR_DEPARTMENT_OTHER): Payer: Self-pay | Admitting: Lab

## 2011-10-26 ENCOUNTER — Other Ambulatory Visit: Payer: Self-pay | Admitting: Lab

## 2011-10-26 ENCOUNTER — Ambulatory Visit: Payer: Self-pay

## 2011-10-26 ENCOUNTER — Other Ambulatory Visit: Payer: Self-pay | Admitting: Oncology

## 2011-10-26 ENCOUNTER — Ambulatory Visit (HOSPITAL_BASED_OUTPATIENT_CLINIC_OR_DEPARTMENT_OTHER): Payer: Self-pay | Admitting: Oncology

## 2011-10-26 ENCOUNTER — Telehealth: Payer: Self-pay | Admitting: *Deleted

## 2011-10-26 DIAGNOSIS — C50919 Malignant neoplasm of unspecified site of unspecified female breast: Secondary | ICD-10-CM

## 2011-10-26 DIAGNOSIS — Z5111 Encounter for antineoplastic chemotherapy: Secondary | ICD-10-CM

## 2011-10-26 LAB — CBC & DIFF AND RETIC
BASO%: 0.3 % (ref 0.0–2.0)
HCT: 39.6 % (ref 34.8–46.6)
HGB: 14.1 g/dL (ref 11.6–15.9)
Immature Retic Fract: 5 % (ref 1.60–10.00)
LYMPH%: 15.6 % (ref 14.0–49.7)
MCH: 34.4 pg — ABNORMAL HIGH (ref 25.1–34.0)
MCV: 96.6 fL (ref 79.5–101.0)
MONO#: 0.5 10*3/uL (ref 0.1–0.9)
NEUT#: 8.8 10*3/uL — ABNORMAL HIGH (ref 1.5–6.5)
Platelets: 215 10*3/uL (ref 145–400)
RDW: 13.1 % (ref 11.2–14.5)
Retic %: 1.85 % (ref 0.70–2.10)
lymph#: 1.8 10*3/uL (ref 0.9–3.3)

## 2011-10-26 LAB — COMPREHENSIVE METABOLIC PANEL
Alkaline Phosphatase: 96 U/L (ref 39–117)
Glucose, Bld: 94 mg/dL (ref 70–99)
Sodium: 139 mEq/L (ref 135–145)
Total Bilirubin: 0.4 mg/dL (ref 0.3–1.2)
Total Protein: 6.1 g/dL (ref 6.0–8.3)

## 2011-10-26 MED ORDER — GOSERELIN ACETATE 10.8 MG ~~LOC~~ IMPL
10.8000 mg | DRUG_IMPLANT | SUBCUTANEOUS | Status: DC
  Administered 2011-10-26: 10.8 mg via SUBCUTANEOUS
  Filled 2011-10-26: qty 10.8

## 2011-10-26 MED ORDER — OXYCODONE-ACETAMINOPHEN 5-325 MG PO TABS: 1.0000 | ORAL_TABLET | Freq: Three times a day (TID) | ORAL | Status: DC | PRN

## 2011-10-26 NOTE — Telephone Encounter (Signed)
Patient came in today with an old calendar per orders from labs and treatment was cancelled for 10-26-2011 confirmed over with the dr.magrinat put in labs and injection for the patient for 10-26-2011 the desk nurse  Patient was placed on the walk in lab schedule gave the patient an updated  Calendar for her appointment in 12-05-2011 starting at 10:15am printed out calendar and gave to the patient

## 2011-10-26 NOTE — Progress Notes (Signed)
Astryd came today complaining of severe pelvic pain with her period, which has returned. We've discussed Zoladex before and she is aware of the possible toxicities side effects and complications of this medication.. She is going to receive her first treatment today. She tells me that absolutely nothing works for her pain except Percocet. I wrote her for 5 Percocets with no refills. She will see [place] again in August. The plan is to continue Zoladex every 3 months indefinitely.

## 2011-10-27 ENCOUNTER — Ambulatory Visit: Payer: Self-pay | Admitting: Oncology

## 2011-10-27 NOTE — Progress Notes (Signed)
Pt called to state period has stopped post injection yesturday but she is continuing to have R ovarian pain which she has used all of her the percocet for.  Pt is requesting more percocet.  Per MD refill cannot be given as discussed at visit yesturday.  Discussed with pt per MD above.

## 2011-10-31 ENCOUNTER — Ambulatory Visit (HOSPITAL_COMMUNITY)
Admission: RE | Admit: 2011-10-31 | Discharge: 2011-10-31 | Disposition: A | Discharge status: Home / Self Care | Payer: Self-pay | Source: Ambulatory Visit | Attending: Internal Medicine | Admitting: Internal Medicine

## 2011-10-31 ENCOUNTER — Encounter (HOSPITAL_COMMUNITY): Payer: Self-pay

## 2011-10-31 VITALS — BP 115/70 | HR 67 | Ht 64.0 in | Wt 117.0 lb

## 2011-10-31 DIAGNOSIS — C50919 Malignant neoplasm of unspecified site of unspecified female breast: Secondary | ICD-10-CM | POA: Insufficient documentation

## 2011-10-31 DIAGNOSIS — I429 Cardiomyopathy, unspecified: Secondary | ICD-10-CM | POA: Insufficient documentation

## 2011-10-31 DIAGNOSIS — Z09 Encounter for follow-up examination after completed treatment for conditions other than malignant neoplasm: Secondary | ICD-10-CM

## 2011-10-31 DIAGNOSIS — Z9109 Other allergy status, other than to drugs and biological substances: Secondary | ICD-10-CM | POA: Insufficient documentation

## 2011-10-31 DIAGNOSIS — Z885 Allergy status to narcotic agent status: Secondary | ICD-10-CM | POA: Insufficient documentation

## 2011-10-31 DIAGNOSIS — I427 Cardiomyopathy due to drug and external agent: Secondary | ICD-10-CM

## 2011-10-31 DIAGNOSIS — T451X5A Adverse effect of antineoplastic and immunosuppressive drugs, initial encounter: Secondary | ICD-10-CM | POA: Insufficient documentation

## 2011-10-31 DIAGNOSIS — Z881 Allergy status to other antibiotic agents status: Secondary | ICD-10-CM | POA: Insufficient documentation

## 2011-10-31 DIAGNOSIS — I498 Other specified cardiac arrhythmias: Secondary | ICD-10-CM | POA: Insufficient documentation

## 2011-10-31 DIAGNOSIS — F411 Generalized anxiety disorder: Secondary | ICD-10-CM | POA: Insufficient documentation

## 2011-10-31 DIAGNOSIS — Z9221 Personal history of antineoplastic chemotherapy: Secondary | ICD-10-CM | POA: Insufficient documentation

## 2011-10-31 DIAGNOSIS — Z9010 Acquired absence of unspecified breast and nipple: Secondary | ICD-10-CM | POA: Insufficient documentation

## 2011-10-31 DIAGNOSIS — Z8614 Personal history of Methicillin resistant Staphylococcus aureus infection: Secondary | ICD-10-CM | POA: Insufficient documentation

## 2011-10-31 DIAGNOSIS — R569 Unspecified convulsions: Secondary | ICD-10-CM | POA: Insufficient documentation

## 2011-10-31 NOTE — Patient Instructions (Signed)
Follow up 2 months with echo. 

## 2011-10-31 NOTE — Progress Notes (Signed)
  Echocardiogram 2D Echocardiogram has been performed.  [place] 10/31/2011, 10:57 AM

## 2011-10-31 NOTE — Assessment & Plan Note (Signed)
Echo reviewed with Dr. Gala Romney.  EF has improved.  Patient is currently ok to restart herceptin therapy.  Will follow echos closely, repeat in 2 months.  Will continue carvedilol 6.25/9.375, have instructed her to set an alarm to remember to take am dose.  She says she will get better.

## 2011-10-31 NOTE — Progress Notes (Signed)
Referring Physician: Dr. [name] Primary Care: Dr. Warner Mccreedy Primary Cardiologist:  none  HPI: Crystal [name] is a 36 y.o. female with history of seizures (2 episodes with last one ~4 yrs ago), sinus tach and BRCA1 stage II invasive ductal carcinoma, ER/PR/HER-2 positive.  Status post bilateral mastectomy with right axillary lymph node dissection by Dr. Carolynne Edouard on 11/16/10.  She is status post carboplatin/docetaxol/trastuzumab x6, completed mid December 2012.  She is continuing on trastuzumab every 3 weeks for a total of 1 year.  She has also began radiation therapy and will begin antiestrogen therapy once radiation has been completed.  She has been having lymphedema in the right upper extremity and is being seen in the lymphedema clinic.    Echo 03/03/11: EF 60-65%, lateral S' >16 (top cut off) Echo 06/28/11 EF 50% s' 11.8 Echo 08/31/11 EF 45-50% s' 10.2 Echo 10/31/11 EF 55%, 11.1  Here for 1 month follow up today.  Feels well today.  Her cough improved after she finished her Zpak.  She says she had a seizure on Saturday.  Continues to have dizziness after leaning over.  No syncope.  No edema/orhtopnea/PND.  Started Zoladex 2 weeks ago.     Review of Systems: All pertinent positives/negatives as in HPI, otherwise negative.   Past Medical History  Diagnosis Date  . Allergy   . Right breast ca dx'd 10/2010  . Breast cancer 12/03/10  . Status post chemotherapy     docetaxel/carboplatin/trastuzumab  . Anxiety   . Seizures     last seizure 2 years ago, diagnosed 3 years ago  . Hx MRSA infection 03/28/11    Skin    Current Outpatient Prescriptions  Medication Sig Dispense Refill  . ALPRAZolam (XANAX) 0.5 MG tablet Take one of two pills three times daily as needed for anxiety  180 tablet  4  . carvedilol (COREG) 3.125 MG tablet Take 6.25-9.375 mg by mouth See admin instructions. Take 2 tablets every morning and 3 tablets every evening.      . cetirizine-pseudoephedrine (ZYRTEC-D) 5-120 MG per  tablet Take 1 tablet by mouth once.      . citalopram (CELEXA) 20 MG tablet Take 1 tablet (20 mg total) by mouth daily.  30 tablet  1  . polyethylene glycol (MIRALAX / GLYCOLAX) packet Take 17 g by mouth daily as needed. constipation      . promethazine (PHENERGAN) 25 MG tablet Take 25 mg by mouth every 6 (six) hours as needed. nausea      . ranitidine (ZANTAC) 150 MG capsule Take 150 mg by mouth 2 (two) times daily as needed. For heartburn      . tamoxifen (NOLVADEX) 20 MG tablet Take 20 mg by mouth daily.      Marland Kitchen DISCONTD: carvedilol (COREG) 3.125 MG tablet Take 2 tablets (6.25 mg total) by mouth 2 (two) times daily with a meal.  60 tablet  6    Allergies  Allergen Reactions  . Phenytoin Nausea And Vomiting  . Thorazine (Chlorpromazine Hcl) Hives  . Ciprofloxacin Nausea And Vomiting  . Vancomycin Rash    Possible rash reported by MD    PHYSICAL EXAM: Filed Vitals:   10/31/11 1107  BP: 115/70  Pulse: 67  Height: 5\' 4"  (1.626 m)  Weight: 117 lb (53.071 kg)  SpO2: 100%   General:  Thin, No respiratory difficulty HEENT: normal Neck: supple. no JVD. Carotids 2+ bilat; no bruits. No lymphadenopathy or thryomegaly appreciated. Cor: PMI nondisplaced. regular rhythm. No rubs, gallops  or murmurs. Lungs: clear Abdomen: soft, nontender, nondistended. No hepatosplenomegaly. No bruits or masses. Good bowel sounds. Extremities: no cyanosis, clubbing, rash, edema.   Neuro: alert & oriented x 3, cranial nerves grossly intact. moves all 4 extremities w/o difficulty. Affect pleasant.   ASSESSMENT & PLAN:

## 2011-11-07 DIAGNOSIS — R05 Cough: Secondary | ICD-10-CM | POA: Insufficient documentation

## 2011-11-07 NOTE — Assessment & Plan Note (Signed)
Continue to hold Herceptin for now. Cont carvedilol. Will repeat echo soon.

## 2011-11-07 NOTE — Assessment & Plan Note (Signed)
Patient seen and examined with Ulyess Blossom, PA-C. We discussed all aspects of the encounter. I agree with the assessment and plan as stated above.   Her cough has been quite persistent. Etiology unclear. Volume status looks fine so don' think it is HF. Will check CT chest and treat empirically for bronchitis with Z-pack.

## 2011-11-14 ENCOUNTER — Ambulatory Visit: Payer: Self-pay | Admitting: Gastroenterology

## 2011-11-17 ENCOUNTER — Telehealth: Payer: Self-pay | Admitting: Gastroenterology

## 2011-11-17 NOTE — Telephone Encounter (Signed)
Message copied by Arna Snipe on Thu Nov 17, 2011 10:47 PM ------      Message from: Jessee Avers      Created: Wed Oct 19, 2011  9:20 AM       Bill patient per Dr. Russella Dar for same day Cancellation

## 2011-11-22 ENCOUNTER — Other Ambulatory Visit: Payer: Self-pay | Admitting: Physician Assistant

## 2011-12-05 ENCOUNTER — Other Ambulatory Visit (HOSPITAL_BASED_OUTPATIENT_CLINIC_OR_DEPARTMENT_OTHER): Payer: Self-pay | Admitting: Lab

## 2011-12-05 ENCOUNTER — Telehealth: Payer: Self-pay | Admitting: Oncology

## 2011-12-05 ENCOUNTER — Encounter: Payer: Self-pay | Admitting: Physician Assistant

## 2011-12-05 ENCOUNTER — Ambulatory Visit (HOSPITAL_BASED_OUTPATIENT_CLINIC_OR_DEPARTMENT_OTHER): Payer: Self-pay | Admitting: Physician Assistant

## 2011-12-05 ENCOUNTER — Telehealth: Payer: Self-pay

## 2011-12-05 ENCOUNTER — Other Ambulatory Visit: Payer: Self-pay | Admitting: Family Medicine

## 2011-12-05 VITALS — BP 118/86 | HR 70 | Temp 98.2°F | Resp 20 | Ht 64.0 in | Wt 118.7 lb

## 2011-12-05 DIAGNOSIS — R569 Unspecified convulsions: Secondary | ICD-10-CM

## 2011-12-05 DIAGNOSIS — C50919 Malignant neoplasm of unspecified site of unspecified female breast: Secondary | ICD-10-CM

## 2011-12-05 DIAGNOSIS — G43909 Migraine, unspecified, not intractable, without status migrainosus: Secondary | ICD-10-CM

## 2011-12-05 DIAGNOSIS — F419 Anxiety disorder, unspecified: Secondary | ICD-10-CM

## 2011-12-05 DIAGNOSIS — C773 Secondary and unspecified malignant neoplasm of axilla and upper limb lymph nodes: Secondary | ICD-10-CM

## 2011-12-05 DIAGNOSIS — R51 Headache: Secondary | ICD-10-CM

## 2011-12-05 DIAGNOSIS — G40909 Epilepsy, unspecified, not intractable, without status epilepticus: Secondary | ICD-10-CM

## 2011-12-05 LAB — CBC WITH DIFFERENTIAL/PLATELET
Basophils Absolute: 0 10*3/uL (ref 0.0–0.1)
Eosinophils Absolute: 0.2 10*3/uL (ref 0.0–0.5)
HCT: 40.1 % (ref 34.8–46.6)
LYMPH%: 20.7 % (ref 14.0–49.7)
MCV: 93 fL (ref 79.5–101.0)
MONO%: 3.7 % (ref 0.0–14.0)
NEUT#: 4.2 10*3/uL (ref 1.5–6.5)
NEUT%: 71.6 % (ref 38.4–76.8)
Platelets: 166 10*3/uL (ref 145–400)
RBC: 4.31 10*6/uL (ref 3.70–5.45)
nRBC: 0 % (ref 0–0)

## 2011-12-05 LAB — COMPREHENSIVE METABOLIC PANEL
BUN: 12 mg/dL (ref 6–23)
CO2: 26 mEq/L (ref 19–32)
Calcium: 9.6 mg/dL (ref 8.4–10.5)
Creatinine, Ser: 0.86 mg/dL (ref 0.50–1.10)
Glucose, Bld: 90 mg/dL (ref 70–99)
Total Bilirubin: 0.6 mg/dL (ref 0.3–1.2)

## 2011-12-05 MED ORDER — ALPRAZOLAM 0.5 MG PO TABS: ORAL_TABLET | ORAL | Status: DC

## 2011-12-05 NOTE — Progress Notes (Signed)
Crystal [name] is here today because her husband [name] is ill. She would like a RF of her Xanax- I gave her an Rx with 4 RF in March so she is due.  Will refill her medication with 4 RF again today

## 2011-12-05 NOTE — Telephone Encounter (Signed)
Med refills ALPRAZolam Prudy Feeler) 0.5 MG tablet [facility] [place]

## 2011-12-05 NOTE — Telephone Encounter (Signed)
Dr.Copland has already refilled this medication on 12/05/11.

## 2011-12-05 NOTE — Telephone Encounter (Signed)
lmonvm adviisng the pt of her appt with dr benshimon and the echo appt [email or web address]

## 2011-12-06 ENCOUNTER — Telehealth: Payer: Self-pay | Admitting: Oncology

## 2011-12-06 NOTE — Progress Notes (Signed)
ID: Crystal [name]   DOB: 1976-02-26  MR#: [id number]  NWG#:[id number]  HISTORY OF PRESENT ILLNESS: The patient is a 36 year old [place] woman who noted a mass in her right breast and brought it to her primary care physician's attention June of 2012. She was set up for mammography at [facility], where an area of calcifications highly suspicious for carcinoma was biopsied. This was in the upper outer quadrant and it measured 1.7 cm mammographically and 1.8 cm by ultrasonography. The pathology report ([id number]) showed a high-grade invasive ductal carcinoma estrogen receptor positive at 64% progesterone receptor positive at 18%, and HER-2 amplified with a ratio by CISH of 5.24. MIB-1-1 was 65%. A second mass biopsied at the same time was also triple positive.  Given her multiple masses in the right breast, mastectomy was recommended. The patient actually opted for bilateral mastectomies and this was performed together with a right axillary lymph node dissection under Dr. Felicity Pellegrini on 11/16/2010. The final pathology [id number]) showed, on the left, no evidence of cancer. On the right the patient had a high-grade invasive ductal carcinoma measuring 1.9 cm, with negative margins, grade 3, involving 2 of 16 lymph nodes (stage IIB).  Her subsequent history is as detailed below.  INTERVAL HISTORY:  Crystal [name] returns today accompanied by her husband, Crystal [name], for followup of her breast cancer. She is status post recent cat bite requiring debridement and antibiotics. This [name] healed nicely, and is causing no complications.   The interval history is significant for Crystal [name] having had a repeat echocardiogram on July 1 showing an increase in her ejection fraction back to 55-60%, following the mild drop to 50% in May. She [name] seen Dr.  Gala Romney for followup as well. She [name] on core reticulocyte. She denies any chest pain or palpitations. She's had no increased shortness of breath and no orthopnea. No peripheral  swelling. No coughs.  Crystal [name] some increased headaches since her last appointment here. Headaches are not entirely new but seems to be slightly more frequent. She also [name] a recent history of multiple seizures, some of which have been evaluated in the emergency room. She tells me the doctors have "never been able to tell her why" she was having seizures. She is interested in seeking another opinion from another neurologist.   REVIEW OF SYSTEMS: Crystal [name]. She [name] bony aches and pains and occasional muscle spasms. No fevers or chills. No rashes and no abnormal bleeding. No vaginal bleeding since her Zoladex injection in June. No nausea or change in bowel habits. In fact she was previously referred for a GI consult but tells me her "symptoms resolved" so she canceled that appointment.   Otherwise a detailed review of systems is stable and noncontributory.     PAST MEDICAL HISTORY: Past Medical History  Diagnosis Date  . Allergy   . Right breast ca dx'd 10/2010  . Breast cancer 12/03/10  . Status post chemotherapy     docetaxel/carboplatin/trastuzumab  . Anxiety   . Seizures     last seizure 2 years ago, diagnosed 3 years ago  . Hx MRSA infection 03/28/11    Skin  1. History of seizures. The patient [name] been thoroughly evaluated both here by Dr. [name] and at [facility] for this. Among many studies, she had an MRI of the brain on August 28th. This showed an incidental solitary small lesion in the left frontoparietal white matter. It was not felt to be suggestive of multiple sclerosis. A  noncontrasted CT in August 2010 was negative. The patient [name] been off all seizure medications now for 2 months. There [name] been no evidence of seizure recurrence. 2. Complex psychology history (please refer to Dr. [name] note in the E-chart from August 29, 2009) with evidence of anxiety disorder, depression and psychosis. The patient is currently on no medications for  this and denies any of these symptoms at present. 3. History of migraines. 4. Remote history of multidrug abuse. 5. Status post wisdom teeth extraction.  History of MRSA skin infection   PAST SURGICAL HISTORY: Past Surgical History  Procedure Date  . Wisdom tooth extraction   . Breast surgery 11/16/2010    bilateral mastectomy+Ralnd,T1cN1a, Her2+,ERPR+  . Biopsy breast 10/14/2010    right needle core biopsy  . Portacath placement 11/16/2010    placement of left subclavian port  . I&d extremity 09/26/2011    Procedure: IRRIGATION AND DEBRIDEMENT EXTREMITY;  Surgeon: [name] Ribas, MD;  Location: [facility];  Service: Orthopedics;  Laterality: Right;  I&D right hand cat bite wound    FAMILY HISTORY Family History  Problem Relation Age of Onset  . Cancer Maternal Grandfather   The patient's mother is living, in her mid 16s, with no history of breast or ovarian cancer. The patient does not know her biological father. The patient [name] an older brother with bipolar disease, and according to the patient, borderline schizophrenia.   GYNECOLOGIC HISTORY: Menarche at age 47. She is G1, P1 with first pregnancy to term at age 13. The patient had significant pelvic pain recently and was evaluated with transvaginal and pelvic ultrasound on July 17th by Dr. Natale Milch. This showed normal uterine myometrium and endometrium with bilateral functional ovarian cysts. There were no worrisome features. 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 Her husband of 4 years, Crystal [name], works as a Administrator. In addition to them at home is the patient's son, Crystal [name], 67 years old attends [employer] in the tenth grade. Crystal [name], according to the patient, [name] significant nausea problems as indeed the patient did previously. The patient attends a local [address]. [name] is pastor.    ADVANCED  DIRECTIVES:  HEALTH MAINTENANCE: History  Substance Use Topics  . Smoking status: Current Some Day Smoker -- 2.0 packs/day for 20 years    Types: Cigarettes  . Smokeless tobacco: Never Used  . Alcohol Use: No     quit drinking 6 months     Colonoscopy:  PAP:  Bone density:  Lipid panel:  Allergies  Allergen Reactions  . Phenytoin Nausea And Vomiting  . Thorazine (Chlorpromazine Hcl) Hives  . Ciprofloxacin Nausea And Vomiting  . Vancomycin Rash    Possible rash reported by MD    Current Outpatient Prescriptions  Medication Sig Dispense Refill  . ALPRAZolam (XANAX) 0.5 MG tablet Take one or two pills three times daily as needed for anxiety  180 tablet  4  . carvedilol (COREG) 3.125 MG tablet Take 6.25-9.375 mg by mouth See admin instructions. Take 2 tablets every morning and 3 tablets every evening.      . cetirizine-pseudoephedrine (ZYRTEC-D) 5-120 MG per tablet Take 1 tablet by mouth once.      . citalopram (CELEXA) 20 MG tablet TAKE ONE TABLET BY MOUTH ONE TIME DAILY  30 tablet  3  . goserelin (ZOLADEX) 10.8 MG injection Inject 10.8 mg into  the skin every 3 (three) months.      . polyethylene glycol (MIRALAX / GLYCOLAX) packet Take 17 g by mouth daily as needed. constipation      . promethazine (PHENERGAN) 25 MG tablet Take 25 mg by mouth every 6 (six) hours as needed. nausea      . ranitidine (ZANTAC) 150 MG capsule Take 150 mg by mouth 2 (two) times daily as needed. For heartburn      . tamoxifen (NOLVADEX) 20 MG tablet Take 20 mg by mouth daily.      Marland Kitchen DISCONTD: carvedilol (COREG) 3.125 MG tablet Take 2 tablets (6.25 mg total) by mouth 2 (two) times daily with a meal.  60 tablet  6    OBJECTIVE: Young white woman who appears somewhat anxious, but in no acute distress. Filed Vitals:   12/05/11 1034  BP: 118/86  Pulse: 70  Temp: 98.2 F (36.8 C)  Resp: 20     Body mass index is 20.37 kg/(m^2).    ECOG FS: 2  Filed Weights   12/05/11 1034  Weight: 118 lb 11.2 oz  (53.842 kg)   Physical Exam: HEENT:  Sclerae anicteric, conjunctivae pink.  Oropharynx clear.   Nodes:  No cervical, supraclavicular, or axillary lymphadenopathy palpated.  Breast Exam:  Status post bilateral mastectomies. No evidence of local recurrence  Lungs:  Clear to auscultation bilaterally.  No crackles, rhonchi, or wheezes.   Heart:  Regular rate and rhythm.   Abdomen:  Soft, thin, nontender.  Positive bowel sounds.  No organomegaly or masses palpated.   Musculoskeletal:  No focal spinal tenderness to palpation. No peripheral edema. Neuro:  Nonfocal. Alert and oriented x3 with pleasant affect. Skin:  Benign.    LAB RESULTS: Lab Results  Component Value Date   WBC 5.9 12/05/2011   NEUTROABS 4.2 12/05/2011   HGB 14.7 12/05/2011   HCT 40.1 12/05/2011   MCV 93.0 12/05/2011   PLT 166 12/05/2011      Chemistry      Component Value Date/Time   NA 139 12/05/2011 1021   K 4.2 12/05/2011 1021   CL 103 12/05/2011 1021   CO2 26 12/05/2011 1021   BUN 12 12/05/2011 1021   CREATININE 0.86 12/05/2011 1021      Component Value Date/Time   CALCIUM 9.6 12/05/2011 1021   ALKPHOS 80 12/05/2011 1021   AST 23 12/05/2011 1021   ALT 13 12/05/2011 1021   BILITOT 0.6 12/05/2011 1021       Lab Results  Component Value Date   LABCA2 16 08/24/2011     STUDIES:  10/31/2011 [facility]* *[facility]* [address] [place], [place] [address] [phone]  ------------------------------------------------------------ Transthoracic Echocardiography  Patient: Crystal [name], Crystal [name] MR #: [id number] Study Date: 10/31/2011 Gender: F Age: 37 Height: 162.6cm Weight: 53.5kg BSA: 1.54m^2 Pt. Status: Room:  PERFORMING [place], [facility] REFERRING Copland, [place] SONOGRAPHER [place], Lauren Forbes Cellar, Manal Kreutzer ATTENDING Bensimhon, [name] cc:  ------------------------------------------------------------ LV EF: 55% -  60%  ------------------------------------------------------------ History: PMH: 174.9 Risk factors: Breast Cancer.  ------------------------------------------------------------ Study Conclusions  Left ventricle: The cavity size was normal. Wall thickness was normal. Systolic function was normal. The estimated ejection fraction was in the range of 55% to 60%. Wall motion was normal; there were no regional wall motion abnormalities. Left ventricular diastolic function parameters were normal. Transthoracic echocardiography. M-mode, complete 2D, spectral Doppler, and color Doppler. Height: Height: 162.6cm. Height: 64in. Weight: Weight: 53.5kg. Weight: 117.8lb. Body mass index: BMI: 20.3kg/m^2. Body  surface area: BSA: 1.34m^2. Blood pressure: 107/66. Patient status: Outpatient. Location: Echo laboratory.    ASSESSMENT: 36 year old [place] woman, known to be BRCA1 and 2 negative,   (1) status post bilateral mastectomies May of 2012 for a right-sided T1c N1 (stage II) grade 3 invasive ductal carcinoma,estrogen and progesterone receptor positive, HER-2 amplified, with an MIB-1-1 of 85%.   (2) status post carboplatin/docetaxol/trastuzumab x6, completed mid December 2012   (3)on trastuzumab every 3 weeks, currently on hold   (4) status post radiation therapy, completed 07/12/2011  (5)  On tamoxifen since April 2013   (6) On goserelin given every 3 months, 1st injection on 10/26/11.  PLAN:  With regards to her breast cancer, Aryannah seems to be doing well. Per her recent followup with Dr. Gala Romney, she will resume Herceptin, next dose to be given on Monday, August 12. We will repeat her echocardiogram along with another appointment with Dr.  Gala Romney in early September, after 2 doses of Herceptin. She will also see Dr. [name] soon thereafter to review those results.  She will continue on tamoxifen. Of course she will continue to receive Zoladex every 3 months and definitely, the  first dose having been given in late June.  I am concerned, however, with the  headaches and seizures which Crystal [name] [name] to have. She requests a referral to a different neurologist, and we are referring her to Dr. Marjory Lies at [facility] neurologic.  Patient voices understanding and agreement with our plan. She will call any changes or problems.    Jeson Camacho    12/06/2011

## 2011-12-06 NOTE — Telephone Encounter (Signed)
lmonvm adviisng the pt of her aug appt for the lab and the infusion. gve kim in medical records the information that she needs to forward to [facility] for the appt with dr Danae Orleans

## 2011-12-12 ENCOUNTER — Other Ambulatory Visit: Payer: Self-pay | Admitting: *Deleted

## 2011-12-12 ENCOUNTER — Other Ambulatory Visit (HOSPITAL_BASED_OUTPATIENT_CLINIC_OR_DEPARTMENT_OTHER): Payer: Self-pay | Admitting: Lab

## 2011-12-12 ENCOUNTER — Ambulatory Visit (HOSPITAL_BASED_OUTPATIENT_CLINIC_OR_DEPARTMENT_OTHER): Payer: Self-pay

## 2011-12-12 ENCOUNTER — Other Ambulatory Visit: Payer: Self-pay | Admitting: Oncology

## 2011-12-12 VITALS — BP 115/85 | HR 72 | Temp 98.3°F | Resp 20

## 2011-12-12 DIAGNOSIS — C50919 Malignant neoplasm of unspecified site of unspecified female breast: Secondary | ICD-10-CM

## 2011-12-12 DIAGNOSIS — C773 Secondary and unspecified malignant neoplasm of axilla and upper limb lymph nodes: Secondary | ICD-10-CM

## 2011-12-12 DIAGNOSIS — Z5112 Encounter for antineoplastic immunotherapy: Secondary | ICD-10-CM

## 2011-12-12 LAB — COMPREHENSIVE METABOLIC PANEL
ALT: 11 U/L (ref 0–35)
AST: 18 U/L (ref 0–37)
Albumin: 3.9 g/dL (ref 3.5–5.2)
BUN: 15 mg/dL (ref 6–23)
CO2: 27 mEq/L (ref 19–32)
Calcium: 8.8 mg/dL (ref 8.4–10.5)
Chloride: 100 mEq/L (ref 96–112)
Creatinine, Ser: 0.88 mg/dL (ref 0.50–1.10)
Potassium: 3.6 mEq/L (ref 3.5–5.3)

## 2011-12-12 LAB — CBC WITH DIFFERENTIAL/PLATELET
Eosinophils Absolute: 0.2 10*3/uL (ref 0.0–0.5)
HCT: 37.6 % (ref 34.8–46.6)
LYMPH%: 33.1 % (ref 14.0–49.7)
MONO#: 0.4 10*3/uL (ref 0.1–0.9)
NEUT#: 4 10*3/uL (ref 1.5–6.5)
NEUT%: 58.7 % (ref 38.4–76.8)
Platelets: 172 10*3/uL (ref 145–400)
WBC: 6.9 10*3/uL (ref 3.9–10.3)
lymph#: 2.3 10*3/uL (ref 0.9–3.3)
nRBC: 0 % (ref 0–0)

## 2011-12-12 MED ORDER — ONDANSETRON 8 MG/50ML IVPB (CHCC)
8.0000 mg | Freq: Once | INTRAVENOUS | Status: AC
  Administered 2011-12-12: 8 mg via INTRAVENOUS

## 2011-12-12 MED ORDER — HEPARIN SOD (PORK) LOCK FLUSH 100 UNIT/ML IV SOLN
500.0000 [IU] | Freq: Once | INTRAVENOUS | Status: DC | PRN
  Filled 2011-12-12: qty 5

## 2011-12-12 MED ORDER — SODIUM CHLORIDE 0.9 % IJ SOLN
10.0000 mL | INTRAMUSCULAR | Status: DC | PRN
  Filled 2011-12-12: qty 10

## 2011-12-12 MED ORDER — SODIUM CHLORIDE 0.9 % IV SOLN: Freq: Once | INTRAVENOUS | Status: DC

## 2011-12-12 MED ORDER — OXYCODONE HCL 5 MG PO TABS: 5.0000 mg | ORAL_TABLET | ORAL | Status: AC | PRN

## 2011-12-12 MED ORDER — DIPHENHYDRAMINE HCL 25 MG PO CAPS: 25.0000 mg | ORAL_CAPSULE | Freq: Once | ORAL | Status: DC

## 2011-12-12 MED ORDER — SODIUM CHLORIDE 0.9 % IV SOLN
6.0000 mg/kg | Freq: Once | INTRAVENOUS | Status: AC
  Administered 2011-12-12: 336 mg via INTRAVENOUS
  Filled 2011-12-12: qty 16

## 2011-12-12 MED ORDER — ACETAMINOPHEN 325 MG PO TABS
650.0000 mg | ORAL_TABLET | Freq: Once | ORAL | Status: AC
  Administered 2011-12-12: 650 mg via ORAL

## 2011-12-12 NOTE — Patient Instructions (Addendum)
[facility] Discharge Instructions for Patients Receiving Chemotherapy  Today you received the following chemotherapy agents herceptin  To help prevent nausea and vomiting after your treatment, we encourage you to take your nausea medication  and take it as often as prescribedIf you develop nausea and vomiting that is not controlled by your nausea medication, call the clinic. If it is after clinic hours your family physician or the after hours number for the clinic or go to the Emergency Department.   BELOW ARE SYMPTOMS THAT SHOULD BE REPORTED IMMEDIATELY:  *FEVER GREATER THAN 100.5 F  *CHILLS WITH OR WITHOUT FEVER  NAUSEA AND VOMITING THAT IS NOT CONTROLLED WITH YOUR NAUSEA MEDICATION  *UNUSUAL SHORTNESS OF BREATH  *UNUSUAL BRUISING OR BLEEDING  TENDERNESS IN MOUTH AND THROAT WITH OR WITHOUT PRESENCE OF ULCERS  *URINARY PROBLEMS  *BOWEL PROBLEMS  UNUSUAL RASH Items with * indicate a potential emergency and should be followed up as soon as possible.  One of the nurses will contact you 24 hours after your treatment. Please let the nurse know about any problems that you may have experienced. Feel free to call the clinic you have any questions or concerns. The clinic phone number is [phone].   I have been informed and understand all the instructions given to me. I know to contact the clinic, my physician, or go to the Emergency Department if any problems should occur. I do not have any questions at this time, but understand that I may call the clinic during office hours   should I have any questions or need assistance in obtaining follow up care.    __________________________________________  _____________  __________ Signature of Patient or Authorized Representative            Date                   Time    __________________________________________ Nurse's Signature    

## 2011-12-29 ENCOUNTER — Encounter (INDEPENDENT_AMBULATORY_CARE_PROVIDER_SITE_OTHER): Payer: Self-pay | Admitting: General Surgery

## 2012-01-03 ENCOUNTER — Other Ambulatory Visit: Payer: Self-pay | Admitting: Oncology

## 2012-01-03 ENCOUNTER — Telehealth: Payer: Self-pay | Admitting: *Deleted

## 2012-01-03 ENCOUNTER — Ambulatory Visit (HOSPITAL_BASED_OUTPATIENT_CLINIC_OR_DEPARTMENT_OTHER): Payer: Self-pay

## 2012-01-03 ENCOUNTER — Other Ambulatory Visit (HOSPITAL_BASED_OUTPATIENT_CLINIC_OR_DEPARTMENT_OTHER): Payer: Self-pay | Admitting: Lab

## 2012-01-03 DIAGNOSIS — C50919 Malignant neoplasm of unspecified site of unspecified female breast: Secondary | ICD-10-CM

## 2012-01-03 DIAGNOSIS — Z5112 Encounter for antineoplastic immunotherapy: Secondary | ICD-10-CM

## 2012-01-03 DIAGNOSIS — C773 Secondary and unspecified malignant neoplasm of axilla and upper limb lymph nodes: Secondary | ICD-10-CM

## 2012-01-03 LAB — CBC WITH DIFFERENTIAL/PLATELET
BASO%: 0.3 % (ref 0.0–2.0)
Basophils Absolute: 0 10e3/uL (ref 0.0–0.1)
EOS%: 2.1 % (ref 0.0–7.0)
Eosinophils Absolute: 0.2 10e3/uL (ref 0.0–0.5)
HCT: 37.5 % (ref 34.8–46.6)
HGB: 13.6 g/dL (ref 11.6–15.9)
LYMPH%: 21.4 % (ref 14.0–49.7)
MCH: 33.5 pg (ref 25.1–34.0)
MCHC: 36.3 g/dL — ABNORMAL HIGH (ref 31.5–36.0)
MCV: 92.4 fL (ref 79.5–101.0)
MONO#: 0.4 10e3/uL (ref 0.1–0.9)
MONO%: 4.2 % (ref 0.0–14.0)
NEUT#: 6.8 10e3/uL — ABNORMAL HIGH (ref 1.5–6.5)
NEUT%: 72 % (ref 38.4–76.8)
Platelets: 189 10e3/uL (ref 145–400)
RBC: 4.06 10e6/uL (ref 3.70–5.45)
RDW: 12.6 % (ref 11.2–14.5)
WBC: 9.4 10e3/uL (ref 3.9–10.3)
lymph#: 2 10e3/uL (ref 0.9–3.3)
nRBC: 0 % (ref 0–0)

## 2012-01-03 MED ORDER — TRASTUZUMAB CHEMO INJECTION 440 MG
6.0000 mg/kg | Freq: Once | INTRAVENOUS | Status: AC
  Administered 2012-01-03: 336 mg via INTRAVENOUS
  Filled 2012-01-03: qty 16

## 2012-01-03 MED ORDER — ONDANSETRON 8 MG/50ML IVPB (CHCC)
8.0000 mg | Freq: Once | INTRAVENOUS | Status: AC
  Administered 2012-01-03: 8 mg via INTRAVENOUS

## 2012-01-03 MED ORDER — SODIUM CHLORIDE 0.9 % IV SOLN
Freq: Once | INTRAVENOUS | Status: AC
  Administered 2012-01-03: 14:00:00 via INTRAVENOUS

## 2012-01-03 MED ORDER — HEPARIN SOD (PORK) LOCK FLUSH 100 UNIT/ML IV SOLN
500.0000 [IU] | Freq: Once | INTRAVENOUS | Status: AC | PRN
  Administered 2012-01-03: 500 [IU]
  Filled 2012-01-03: qty 5

## 2012-01-03 MED ORDER — OXYCODONE-ACETAMINOPHEN 5-325 MG PO TABS
1.0000 | ORAL_TABLET | Freq: Once | ORAL | Status: AC
  Administered 2012-01-03: 1 via ORAL

## 2012-01-03 MED ORDER — SODIUM CHLORIDE 0.9 % IJ SOLN
10.0000 mL | INTRAMUSCULAR | Status: DC | PRN
  Administered 2012-01-03: 10 mL
  Filled 2012-01-03: qty 10

## 2012-01-03 MED ORDER — OXYCODONE-ACETAMINOPHEN 5-325 MG PO TABS: 1.0000 | ORAL_TABLET | ORAL | Status: DC | PRN

## 2012-01-03 NOTE — Patient Instructions (Addendum)
[facility] Discharge Instructions for Patients Receiving Chemotherapy  Today you received the following chemotherapy agents Herceptin.   To help prevent nausea and vomiting after your treatment, we encourage you to take your nausea medication as prescribed.   If you develop nausea and vomiting that is not controlled by your nausea medication, call the clinic. If it is after clinic hours your family physician or the after hours number for the clinic or go to the Emergency Department.   BELOW ARE SYMPTOMS THAT SHOULD BE REPORTED IMMEDIATELY:  *FEVER GREATER THAN 100.5 F  *CHILLS WITH OR WITHOUT FEVER  NAUSEA AND VOMITING THAT IS NOT CONTROLLED WITH YOUR NAUSEA MEDICATION  *UNUSUAL SHORTNESS OF BREATH  *UNUSUAL BRUISING OR BLEEDING  TENDERNESS IN MOUTH AND THROAT WITH OR WITHOUT PRESENCE OF ULCERS  *URINARY PROBLEMS  *BOWEL PROBLEMS  UNUSUAL RASH Items with * indicate a potential emergency and should be followed up as soon as possible.  Feel free to call the clinic you have any questions or concerns. The clinic phone number is [phone].

## 2012-01-03 NOTE — Telephone Encounter (Signed)
THIS NOTE TO DR.[name]'S NURSE,VAL DODD,RN.

## 2012-01-05 ENCOUNTER — Other Ambulatory Visit: Payer: Self-pay | Admitting: Oncology

## 2012-01-05 DIAGNOSIS — C50919 Malignant neoplasm of unspecified site of unspecified female breast: Secondary | ICD-10-CM

## 2012-01-09 ENCOUNTER — Telehealth: Payer: Self-pay

## 2012-01-09 NOTE — Telephone Encounter (Deleted)
The patient stated she needs records from

## 2012-01-16 ENCOUNTER — Encounter (INDEPENDENT_AMBULATORY_CARE_PROVIDER_SITE_OTHER): Payer: Self-pay | Admitting: General Surgery

## 2012-01-16 ENCOUNTER — Ambulatory Visit (HOSPITAL_BASED_OUTPATIENT_CLINIC_OR_DEPARTMENT_OTHER): Payer: Self-pay | Admitting: Oncology

## 2012-01-16 ENCOUNTER — Telehealth: Payer: Self-pay | Admitting: *Deleted

## 2012-01-16 ENCOUNTER — Other Ambulatory Visit (HOSPITAL_BASED_OUTPATIENT_CLINIC_OR_DEPARTMENT_OTHER): Payer: Self-pay | Admitting: Lab

## 2012-01-16 VITALS — BP 126/96 | HR 68 | Temp 98.2°F | Resp 20 | Ht 64.0 in | Wt 124.7 lb

## 2012-01-16 DIAGNOSIS — C50919 Malignant neoplasm of unspecified site of unspecified female breast: Secondary | ICD-10-CM

## 2012-01-16 DIAGNOSIS — K3189 Other diseases of stomach and duodenum: Secondary | ICD-10-CM

## 2012-01-16 DIAGNOSIS — C773 Secondary and unspecified malignant neoplasm of axilla and upper limb lymph nodes: Secondary | ICD-10-CM

## 2012-01-16 DIAGNOSIS — Z170 Estrogen receptor positive status [ER+]: Secondary | ICD-10-CM

## 2012-01-16 LAB — CBC WITH DIFFERENTIAL/PLATELET
BASO%: 0.3 % (ref 0.0–2.0)
EOS%: 2.9 % (ref 0.0–7.0)
MCH: 33.6 pg (ref 25.1–34.0)
MCV: 94.1 fL (ref 79.5–101.0)
MONO%: 6 % (ref 0.0–14.0)
RBC: 4.08 10*6/uL (ref 3.70–5.45)
RDW: 12.4 % (ref 11.2–14.5)
nRBC: 0 % (ref 0–0)

## 2012-01-16 NOTE — Progress Notes (Signed)
ID: Crystal [name]   DOB: 06/10/1975  MR#: [id number]  YQM#:[id number]  HISTORY OF PRESENT ILLNESS: The patient is a 36 year old [place] woman who noted a mass in her right breast and brought it to her primary care physician's attention June of 2012. She was set up for mammography at [facility], where an area of calcifications highly suspicious for carcinoma was biopsied. This was in the upper outer quadrant and it measured 1.7 cm mammographically and 1.8 cm by ultrasonography. The pathology report ([id number]) showed a high-grade invasive ductal carcinoma estrogen receptor positive at 64% progesterone receptor positive at 18%, and HER-2 amplified with a ratio by CISH of 5.24. MIB-1-1 was 65%. A second mass biopsied at the same time was also triple positive.  Given her multiple masses in the right breast, mastectomy was recommended. The patient actually opted for bilateral mastectomies and this was performed together with a right axillary lymph node dissection under Dr. Felicity Pellegrini on 11/16/2010. The final pathology [id number]) showed, on the left, no evidence of cancer. On the right the patient had a high-grade invasive ductal carcinoma measuring 1.9 cm, with negative margins, grade 3, involving 2 of 16 lymph nodes (stage IIB).  Her subsequent history is as detailed below.  INTERVAL HISTORY:  Crystal [name] returns today accompanied by her husband, Crystal [name], for followup of her breast cancer. Since her last visit here she was evaluated by neurology. She tells me the neurologist told her she probably has migraines. He prescribed some gabapentin which she says so far has not helped. She is undergoing disability evaluation, and does have a lawyer that is helping her.  REVIEW OF SYSTEMS: She feels a little confused. She does not know if this is stress or something else. One time she can remember her husband's name. Sometimes she takes the wrong pale, he says. She can't remember if she felt the cancer not. She has a  stomach upset every time she eats anything, and she feels like vomiting. Sometimes she actually does vomit. She feels tired, and has intermittent pains and aches in the right breast area, which are not new. She has a bit of a running nose, but she did not complain of dizziness, or further seizure problems, or visual changes. There have been no focal weakness. She admits to anxiety and depression. She is having some hot flashes. Since she started her goserelin, she has not had any further pelvic pain problems. Otherwise a detailed review of systems today was stable.   PAST MEDICAL HISTORY: Past Medical History  Diagnosis Date  . Allergy   . Right breast ca dx'd 10/2010  . Breast cancer 12/03/10  . Status post chemotherapy     docetaxel/carboplatin/trastuzumab  . Anxiety   . Seizures     last seizure 2 years ago, diagnosed 3 years ago  . Hx MRSA infection 03/28/11    Skin  1. History of seizures. The patient has been thoroughly evaluated both here by Dr. [name] and at [facility] for this. Among many studies, she had an MRI of the brain on August 28th. This showed an incidental solitary small lesion in the left frontoparietal white matter. It was not felt to be suggestive of multiple sclerosis. A noncontrasted CT in August 2010 was negative. The patient has been off all seizure medications now for 2 months. There has been no evidence of seizure recurrence. 2. Complex psychology history (please refer to Dr. [name] note in the E-chart from August 29, 2009) with evidence of anxiety disorder, depression  and psychosis. The patient is currently on no medications for this and denies any of these symptoms at present. 3. History of migraines. 4. Remote history of multidrug abuse. 5. Status post wisdom teeth extraction.  History of MRSA skin infection   PAST SURGICAL HISTORY: Past Surgical History  Procedure Date  . Wisdom tooth extraction   . Breast surgery 11/16/2010    bilateral  mastectomy+Ralnd,T1cN1a, Her2+,ERPR+  . Biopsy breast 10/14/2010    right needle core biopsy  . Portacath placement 11/16/2010    placement of left subclavian port  . I&d extremity 09/26/2011    Procedure: IRRIGATION AND DEBRIDEMENT EXTREMITY;  Surgeon: [name] Ribas, MD;  Location: [facility];  Service: Orthopedics;  Laterality: Right;  I&D right hand cat bite wound    FAMILY HISTORY Family History  Problem Relation Age of Onset  . Cancer Maternal Grandfather   The patient's mother is living, in her mid 34s, with no history of breast or ovarian cancer. The patient does not know her biological father. The patient has an older brother with bipolar disease, and according to the patient, borderline schizophrenia.   GYNECOLOGIC HISTORY: Menarche at age 43. She is G1, P1 with first pregnancy to term at age 70. The patient had significant pelvic pain recently and was evaluated with transvaginal and pelvic ultrasound on July 17th by Dr. Natale Milch. This showed normal uterine myometrium and endometrium with bilateral functional ovarian cysts. There were no worrisome features. She is currently on every three-month goserelin.  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 Her husband of 4 years, Crystal [name], works as a Administrator. In addition to them at home is the patient's son, Crystal [name], 27 years old attends [employer] in the tenth grade. [name], according to the patient, has significant nausea problems. The patient attends a local [address]. [name] is pastor.    ADVANCED DIRECTIVES:  HEALTH MAINTENANCE: History  Substance Use Topics  . Smoking status: Current Some Day Smoker -- 2.0 packs/day for 20 years    Types: Cigarettes  . Smokeless tobacco: Never Used  . Alcohol Use: No     quit drinking 6 months     Colonoscopy:  PAP:  Bone density:  Lipid panel:  Allergies  Allergen Reactions  . Phenytoin Nausea And Vomiting   . Thorazine (Chlorpromazine Hcl) Hives  . Ciprofloxacin Nausea And Vomiting  . Vancomycin Rash    Possible rash reported by MD    Current Outpatient Prescriptions  Medication Sig Dispense Refill  . ALPRAZolam (XANAX) 0.5 MG tablet Take one or two pills three times daily as needed for anxiety  180 tablet  4  . carvedilol (COREG) 3.125 MG tablet Take 6.25-9.375 mg by mouth See admin instructions. Take 2 tablets every morning and 3 tablets every evening.      . cetirizine-pseudoephedrine (ZYRTEC-D) 5-120 MG per tablet Take 1 tablet by mouth once.      . citalopram (CELEXA) 20 MG tablet TAKE ONE TABLET BY MOUTH ONE TIME DAILY  30 tablet  3  . goserelin (ZOLADEX) 10.8 MG injection Inject 10.8 mg into the skin every 3 (three) months.      . polyethylene glycol (MIRALAX / GLYCOLAX) packet Take 17 g by mouth daily as needed. constipation      . promethazine (PHENERGAN) 25 MG tablet TAKE ONE TABLET BY MOUTH BEFORE MEALS & AT BEDTIME AS NEEDED FORNAUSEA  60 tablet  3  .  ranitidine (ZANTAC) 150 MG capsule Take 150 mg by mouth 2 (two) times daily as needed. For heartburn      . tamoxifen (NOLVADEX) 20 MG tablet Take 20 mg by mouth daily.      Marland Kitchen DISCONTD: carvedilol (COREG) 3.125 MG tablet Take 2 tablets (6.25 mg total) by mouth 2 (two) times daily with a meal.  60 tablet  6    OBJECTIVE: Young white woman who appears somewhat fatigued   Filed Vitals:   01/16/12 1101  BP: 126/96  Pulse: 68  Temp: 98.2 F (36.8 C)  Resp: 20     Body mass index is 21.40 kg/(m^2).    ECOG FS: 2  Filed Weights   01/16/12 1101  Weight: 124 lb 11.2 oz (56.564 kg)   Physical Exam: HEENT:  Sclerae anicteric.  Oropharynx clear.   Nodes:  No cervical, supraclavicular, or axillary lymphadenopathy palpated.  Breast Exam:  Status post bilateral mastectomies. No evidence of local recurrence  Lungs:  Clear to auscultation bilaterally.  No crackles, rhonchi, or wheezes.   Heart:  Regular rate and rhythm.   Abdomen:   Soft, thin, nontender.  Positive bowel sounds.  No organomegaly or masses palpated.   Musculoskeletal:  No focal spinal tenderness to palpation. No peripheral edema. Neuro:  Entirely nonfocal. Alert and oriented x3    LAB RESULTS: Lab Results  Component Value Date   WBC 6.6 01/16/2012   NEUTROABS 4.5 01/16/2012   HGB 13.7 01/16/2012   HCT 38.4 01/16/2012   MCV 94.1 01/16/2012   PLT 135* 01/16/2012      Chemistry      Component Value Date/Time   NA 137 12/12/2011 1451   K 3.6 12/12/2011 1451   CL 100 12/12/2011 1451   CO2 27 12/12/2011 1451   BUN 15 12/12/2011 1451   CREATININE 0.88 12/12/2011 1451      Component Value Date/Time   CALCIUM 8.8 12/12/2011 1451   ALKPHOS 84 12/12/2011 1451   AST 18 12/12/2011 1451   ALT 11 12/12/2011 1451   BILITOT 0.4 12/12/2011 1451       Lab Results  Component Value Date   LABCA2 16 08/24/2011     STUDIES:  10/31/2011 [facility] [place] [facility]* *[facility]* [address] [place], [place] [address] [phone]  ------------------------------------------------------------ Transthoracic Echocardiography  Patient: Kerington, Steurer MR #: [id number] Study Date: 10/31/2011 Gender: F Age: 60 Height: 162.6cm Weight: 53.5kg BSA: 1.37m^2 Pt. Status: Room:  PERFORMING [facility] REFERRING [place], Gackle SONOGRAPHER [place], Lauren Forbes Cellar, Amy ATTENDING Bensimhon, [name] cc:  ------------------------------------------------------------ LV EF: 55% - 60%  ------------------------------------------------------------ History: PMH: 174.9 Risk factors: Breast Cancer.  ------------------------------------------------------------ Study Conclusions  Left ventricle: The cavity size was normal. Wall thickness was normal. Systolic function was normal. The estimated ejection fraction was in the range of 55% to 60%. Wall motion was normal; there were no regional wall motion abnormalities. Left ventricular  diastolic function parameters were normal. Transthoracic echocardiography. M-mode, complete 2D, spectral Doppler, and color Doppler. Height: Height: 162.6cm. Height: 64in. Weight: Weight: 53.5kg. Weight: 117.8lb. Body mass index: BMI: 20.3kg/m^2. Body surface area: BSA: 1.51m^2. Blood pressure: 107/66. Patient status: Outpatient. Location: Echo laboratory.    ASSESSMENT: 36 year old [place] woman, known to be BRCA1 and 2 negative,   (1) status post bilateral mastectomies May of 2012 for a right-sided T1c N1 (stage II) grade 3 invasive ductal carcinoma,estrogen and progesterone receptor positive, HER-2 amplified, with an MIB-1-1 of 85%.   (2) status post carboplatin/docetaxol/trastuzumab  x6, completed mid December 2012   (3) on trastuzumab every 3 weeks, started August of 2012, interrupted between April and August of 2013. She will complete one year at the end of this year.   (4) status post radiation therapy, completed 07/12/2011  (5)  On tamoxifen since April 2013   (6) On goserelin given every 3 months, 1st injection on 10/26/11.  PLAN:  I am not sure I understand Ziyana's GI issues. She had an appointment with Dr. Russella Dar but canceled that because she got better in the interim. I am making her an appointment with Dr. Loreta Ave, for further evaluation. I am reassured that she is seeing neurology. From a breast cancer point of view there is no evidence of recurrence. We're going to continue the goserelin pretty much indefinitely since she does feel it is taking care of her pelvic pain issues. We're also continuing the tamoxifen of course. She will stop having Herceptin after her final dose December of this year. She is already scheduled for an echocardiogram next week and her next Herceptin dose September 23. She knows to call for any the problems that may develop before the next visit.   [name],[name]    01/16/2012

## 2012-01-16 NOTE — Telephone Encounter (Signed)
Per staff message and POF I have scheduled appts.  Crystal [name]  

## 2012-01-17 ENCOUNTER — Telehealth: Payer: Self-pay | Admitting: *Deleted

## 2012-01-17 NOTE — Telephone Encounter (Signed)
see dr Hillery Aldo: pt needs evaluation for dyspepsia, persisten nausea

## 2012-01-18 ENCOUNTER — Ambulatory Visit (HOSPITAL_COMMUNITY)
Admission: RE | Admit: 2012-01-18 | Discharge: 2012-01-18 | Disposition: A | Discharge status: Home / Self Care | Payer: Self-pay | Source: Ambulatory Visit | Attending: Internal Medicine | Admitting: Internal Medicine

## 2012-01-18 ENCOUNTER — Ambulatory Visit (HOSPITAL_BASED_OUTPATIENT_CLINIC_OR_DEPARTMENT_OTHER)
Admission: RE | Admit: 2012-01-18 | Discharge: 2012-01-18 | Disposition: A | Discharge status: Home / Self Care | Payer: Self-pay | Source: Ambulatory Visit | Attending: Internal Medicine | Admitting: Internal Medicine

## 2012-01-18 VITALS — BP 122/82 | HR 69 | Wt 125.0 lb

## 2012-01-18 DIAGNOSIS — R002 Palpitations: Secondary | ICD-10-CM | POA: Insufficient documentation

## 2012-01-18 DIAGNOSIS — I427 Cardiomyopathy due to drug and external agent: Secondary | ICD-10-CM

## 2012-01-18 DIAGNOSIS — I429 Cardiomyopathy, unspecified: Secondary | ICD-10-CM

## 2012-01-18 DIAGNOSIS — I079 Rheumatic tricuspid valve disease, unspecified: Secondary | ICD-10-CM | POA: Insufficient documentation

## 2012-01-18 DIAGNOSIS — C50919 Malignant neoplasm of unspecified site of unspecified female breast: Secondary | ICD-10-CM | POA: Insufficient documentation

## 2012-01-18 DIAGNOSIS — I059 Rheumatic mitral valve disease, unspecified: Secondary | ICD-10-CM | POA: Insufficient documentation

## 2012-01-18 DIAGNOSIS — T451X5A Adverse effect of antineoplastic and immunosuppressive drugs, initial encounter: Secondary | ICD-10-CM

## 2012-01-18 MED ORDER — CARVEDILOL 6.25 MG PO TABS: 6.2500 mg | ORAL_TABLET | Freq: Two times a day (BID) | ORAL | Status: DC

## 2012-01-18 MED ORDER — CARVEDILOL 3.125 MG PO TABS: 3.1250 mg | ORAL_TABLET | Freq: Two times a day (BID) | ORAL | Status: DC

## 2012-01-18 NOTE — Progress Notes (Signed)
Referring Physician: Dr. [name] Primary Care: Dr. Warner Mccreedy Primary Cardiologist:  none  HPI: Crystal [name] is a 36 y.o. female with history of seizures (2 episodes with last one ~4 yrs ago), sinus tach and BRCA1 stage II invasive ductal carcinoma, ER/PR/HER-2 positive.  Status post bilateral mastectomy with right axillary lymph node dissection by Dr. Carolynne Edouard on 11/16/10.  She is status post carboplatin/docetaxol/trastuzumab x6, completed mid December 2012.  She is continuing on trastuzumab every 3 weeks for a total of 1 year.  She has also began radiation therapy and will begin antiestrogen therapy once radiation has been completed.  She has been having lymphedema in the right upper extremity and is being seen in the lymphedema clinic.    Echo 03/03/11: EF 60-65%, lateral S' >16 (top cut off) Echo 06/28/11 EF 50% s' 11.8 Echo 08/31/11 EF 45-50% s' 10.2 - herceptin held Echo 10/31/11 EF 55%, 10.5 Echo 01/18/12 EF 55% 10.3  Here for routine follow-up. Back on Herceptin. Has had one treatment since we held Herceptin. Starting to feel better. Has days when she feels exhausted though. Feels her HR is slower but occasionally has episodes of tachypalpitations - a couple times per month. Lasts a little while a then goes away. Gets dizzy if she stands up too quickly - feels it is related to her blood sugar. Gets better if she eats. No edema.   Review of Systems: All pertinent positives/negatives as in HPI, otherwise negative.   Past Medical History  Diagnosis Date  . Allergy   . Right breast ca dx'd 10/2010  . Breast cancer 12/03/10  . Status post chemotherapy     docetaxel/carboplatin/trastuzumab  . Anxiety   . Seizures     last seizure 2 years ago, diagnosed 3 years ago  . Hx MRSA infection 03/28/11    Skin    Current Outpatient Prescriptions  Medication Sig Dispense Refill  . ALPRAZolam (XANAX) 0.5 MG tablet Take one or two pills three times daily as needed for anxiety  180 tablet  4  .  carvedilol (COREG) 3.125 MG tablet Take 6.25-9.375 mg by mouth See admin instructions. Take 2 tablets every morning and 3 tablets every evening.      . cetirizine-pseudoephedrine (ZYRTEC-D) 5-120 MG per tablet Take 1 tablet by mouth once.      . citalopram (CELEXA) 20 MG tablet TAKE ONE TABLET BY MOUTH ONE TIME DAILY  30 tablet  3  . goserelin (ZOLADEX) 10.8 MG injection Inject 10.8 mg into the skin every 3 (three) months.      . polyethylene glycol (MIRALAX / GLYCOLAX) packet Take 17 g by mouth daily as needed. constipation      . promethazine (PHENERGAN) 25 MG tablet TAKE ONE TABLET BY MOUTH BEFORE MEALS & AT BEDTIME AS NEEDED FORNAUSEA  60 tablet  3  . ranitidine (ZANTAC) 150 MG capsule Take 150 mg by mouth 2 (two) times daily as needed. For heartburn      . tamoxifen (NOLVADEX) 20 MG tablet Take 20 mg by mouth daily.      Marland Kitchen DISCONTD: carvedilol (COREG) 3.125 MG tablet Take 2 tablets (6.25 mg total) by mouth 2 (two) times daily with a meal.  60 tablet  6    Allergies  Allergen Reactions  . Phenytoin Nausea And Vomiting  . Thorazine (Chlorpromazine Hcl) Hives  . Ciprofloxacin Nausea And Vomiting  . Vancomycin Rash    Possible rash reported by MD    PHYSICAL EXAM: Filed Vitals:   01/18/12  1115  BP: 122/82  Pulse: 69  Weight: 125 lb (56.7 kg)  SpO2: 100%   General:  Thin, No respiratory difficulty HEENT: normal Neck: supple. no JVD. Carotids 2+ bilat; no bruits. No lymphadenopathy or thryomegaly appreciated. Cor: PMI nondisplaced. regular rhythm. No rubs, gallops or murmurs. Lungs: clear Abdomen: soft, nontender, nondistended. No hepatosplenomegaly. No bruits or masses. Good bowel sounds. Extremities: no cyanosis, clubbing, rash, edema.   Neuro: alert & oriented x 3, cranial nerves grossly intact. moves all 4 extremities w/o difficulty. Affect pleasant.   ASSESSMENT & PLAN:

## 2012-01-18 NOTE — Assessment & Plan Note (Signed)
Suspect this may be related to anxiety. If gets worse will place a monitor.

## 2012-01-18 NOTE — Progress Notes (Signed)
  Echocardiogram 2D Echocardiogram has been performed.  [place] 01/18/2012, 10:42 AM 

## 2012-01-18 NOTE — Assessment & Plan Note (Addendum)
I reviewed echos personally. EF and Doppler parameters stable. No HF on exam. Continue Herceptin. Will increase carvedilol to 9.375 mg bid. Repeat echo 2 months.

## 2012-01-23 ENCOUNTER — Other Ambulatory Visit: Payer: Self-pay

## 2012-01-23 ENCOUNTER — Other Ambulatory Visit (HOSPITAL_BASED_OUTPATIENT_CLINIC_OR_DEPARTMENT_OTHER): Payer: Self-pay | Admitting: Lab

## 2012-01-23 ENCOUNTER — Ambulatory Visit (HOSPITAL_BASED_OUTPATIENT_CLINIC_OR_DEPARTMENT_OTHER): Payer: Self-pay

## 2012-01-23 VITALS — BP 115/91 | HR 68 | Temp 98.1°F

## 2012-01-23 DIAGNOSIS — Z5112 Encounter for antineoplastic immunotherapy: Secondary | ICD-10-CM

## 2012-01-23 DIAGNOSIS — Z5111 Encounter for antineoplastic chemotherapy: Secondary | ICD-10-CM

## 2012-01-23 DIAGNOSIS — C773 Secondary and unspecified malignant neoplasm of axilla and upper limb lymph nodes: Secondary | ICD-10-CM

## 2012-01-23 DIAGNOSIS — Z170 Estrogen receptor positive status [ER+]: Secondary | ICD-10-CM

## 2012-01-23 DIAGNOSIS — C50919 Malignant neoplasm of unspecified site of unspecified female breast: Secondary | ICD-10-CM

## 2012-01-23 LAB — CBC WITH DIFFERENTIAL/PLATELET
BASO%: 0.7 % (ref 0.0–2.0)
LYMPH%: 24.5 % (ref 14.0–49.7)
MCHC: 35.6 g/dL (ref 31.5–36.0)
MONO#: 0.3 10*3/uL (ref 0.1–0.9)
RBC: 4.01 10*6/uL (ref 3.70–5.45)
WBC: 6.9 10*3/uL (ref 3.9–10.3)
lymph#: 1.7 10*3/uL (ref 0.9–3.3)

## 2012-01-23 LAB — COMPREHENSIVE METABOLIC PANEL (CC13)
ALT: 14 U/L (ref 0–55)
AST: 19 U/L (ref 5–34)
CO2: 23 mEq/L (ref 22–29)
Calcium: 9.1 mg/dL (ref 8.4–10.4)
Chloride: 107 mEq/L (ref 98–107)
Creatinine: 0.8 mg/dL (ref 0.6–1.1)
Potassium: 3.7 mEq/L (ref 3.5–5.1)
Sodium: 140 mEq/L (ref 136–145)
Total Protein: 6.2 g/dL — ABNORMAL LOW (ref 6.4–8.3)

## 2012-01-23 MED ORDER — HEPARIN SOD (PORK) LOCK FLUSH 100 UNIT/ML IV SOLN
500.0000 [IU] | Freq: Once | INTRAVENOUS | Status: AC | PRN
  Administered 2012-01-23: 500 [IU]
  Filled 2012-01-23: qty 5

## 2012-01-23 MED ORDER — SODIUM CHLORIDE 0.9 % IV SOLN: Freq: Once | INTRAVENOUS | Status: DC

## 2012-01-23 MED ORDER — ONDANSETRON 8 MG/50ML IVPB (CHCC)
8.0000 mg | Freq: Once | INTRAVENOUS | Status: AC
  Administered 2012-01-23: 8 mg via INTRAVENOUS

## 2012-01-23 MED ORDER — TRASTUZUMAB CHEMO INJECTION 440 MG
6.0000 mg/kg | Freq: Once | INTRAVENOUS | Status: AC
  Administered 2012-01-23: 336 mg via INTRAVENOUS
  Filled 2012-01-23: qty 16

## 2012-01-23 MED ORDER — SODIUM CHLORIDE 0.9 % IJ SOLN
10.0000 mL | INTRAMUSCULAR | Status: DC | PRN
  Administered 2012-01-23: 10 mL
  Filled 2012-01-23: qty 10

## 2012-01-23 MED ORDER — OXYCODONE-ACETAMINOPHEN 5-325 MG PO TABS
1.0000 | ORAL_TABLET | Freq: Once | ORAL | Status: AC
  Administered 2012-01-23: 1 via ORAL

## 2012-01-23 MED ORDER — GOSERELIN ACETATE 10.8 MG ~~LOC~~ IMPL
10.8000 mg | DRUG_IMPLANT | SUBCUTANEOUS | Status: DC
  Administered 2012-01-23: 10.8 mg via SUBCUTANEOUS
  Filled 2012-01-23: qty 10.8

## 2012-01-23 NOTE — Patient Instructions (Signed)
 Cancer Center Discharge Instructions for Patients Receiving Chemotherapy  Today you received the following chemotherapy agents herceptin, zoladex  To help prevent nausea and vomiting after your treatment, we encourage you to take your nausea medication  If you develop nausea and vomiting that is not controlled by your nausea medication, call the clinic. If it is after clinic hours your family physician or the after hours number for the clinic or go to the Emergency Department.   BELOW ARE SYMPTOMS THAT SHOULD BE REPORTED IMMEDIATELY:  *FEVER GREATER THAN 100.5 F  *CHILLS WITH OR WITHOUT FEVER  NAUSEA AND VOMITING THAT IS NOT CONTROLLED WITH YOUR NAUSEA MEDICATION  *UNUSUAL SHORTNESS OF BREATH  *UNUSUAL BRUISING OR BLEEDING  TENDERNESS IN MOUTH AND THROAT WITH OR WITHOUT PRESENCE OF ULCERS  *URINARY PROBLEMS  *BOWEL PROBLEMS  UNUSUAL RASH Items with * indicate a potential emergency and should be followed up as soon as possible.  The clinic phone number is [phone].

## 2012-02-03 ENCOUNTER — Other Ambulatory Visit: Payer: Self-pay | Admitting: *Deleted

## 2012-02-03 ENCOUNTER — Telehealth: Payer: Self-pay | Admitting: *Deleted

## 2012-02-03 MED ORDER — DOXYCYCLINE HYCLATE 100 MG PO TABS: 100.0000 mg | ORAL_TABLET | Freq: Every day | ORAL | Status: DC

## 2012-02-03 MED ORDER — HYDROCODONE-ACETAMINOPHEN 10-500 MG PO TABS: 1.0000 | ORAL_TABLET | Freq: Four times a day (QID) | ORAL | Status: DC | PRN

## 2012-02-03 MED ORDER — CEPHALEXIN 500 MG PO CAPS: 500.0000 mg | ORAL_CAPSULE | Freq: Three times a day (TID) | ORAL | Status: DC

## 2012-02-03 NOTE — Telephone Encounter (Signed)
This RN post review with AB/PA obtained recommendation for doxycicline and lortab for area of concern.  Called to pharmacy and to patient. Of note pt states area of discomfort is now occurring along area of surgery " feels like I am having boob pain".  This RN discussed with pt possible feelings relating to cellulitis and if area worsens post starting ABX she is to call MD or proceed to the ER.  Crystal [name] verbalized understanding.

## 2012-02-03 NOTE — Telephone Encounter (Signed)
Pt called to state noted area under Right arm area that is tender and sore. Area is NOT adjacent to scar but is in area where lymph nodes were removed.  Merary states she has used ibuprofen and tylenol with out benefit.  Return call number given as [phone].

## 2012-02-09 ENCOUNTER — Telehealth: Payer: Self-pay | Admitting: *Deleted

## 2012-02-09 NOTE — Telephone Encounter (Signed)
Per patient voice mail message regarding her appts for Monday. I have forwarded the message to the desk RN.  JMW

## 2012-02-13 ENCOUNTER — Ambulatory Visit (HOSPITAL_BASED_OUTPATIENT_CLINIC_OR_DEPARTMENT_OTHER): Payer: Self-pay

## 2012-02-13 ENCOUNTER — Other Ambulatory Visit (HOSPITAL_BASED_OUTPATIENT_CLINIC_OR_DEPARTMENT_OTHER): Payer: Self-pay | Admitting: Lab

## 2012-02-13 VITALS — BP 117/88 | HR 80 | Temp 98.1°F

## 2012-02-13 DIAGNOSIS — C773 Secondary and unspecified malignant neoplasm of axilla and upper limb lymph nodes: Secondary | ICD-10-CM

## 2012-02-13 DIAGNOSIS — C50919 Malignant neoplasm of unspecified site of unspecified female breast: Secondary | ICD-10-CM

## 2012-02-13 DIAGNOSIS — Z5112 Encounter for antineoplastic immunotherapy: Secondary | ICD-10-CM

## 2012-02-13 LAB — CBC WITH DIFFERENTIAL/PLATELET
BASO%: 0.5 % (ref 0.0–2.0)
Basophils Absolute: 0 10*3/uL (ref 0.0–0.1)
EOS%: 4.2 % (ref 0.0–7.0)
HGB: 14.4 g/dL (ref 11.6–15.9)
MCH: 33.6 pg (ref 25.1–34.0)
MCHC: 35.7 g/dL (ref 31.5–36.0)
MCV: 93.9 fL (ref 79.5–101.0)
MONO%: 4.2 % (ref 0.0–14.0)
NEUT%: 67.8 % (ref 38.4–76.8)
RDW: 12.1 % (ref 11.2–14.5)
lymph#: 2.1 10*3/uL (ref 0.9–3.3)

## 2012-02-13 LAB — COMPREHENSIVE METABOLIC PANEL (CC13)
AST: 26 U/L (ref 5–34)
Alkaline Phosphatase: 79 U/L (ref 40–150)
BUN: 14 mg/dL (ref 7.0–26.0)
Glucose: 86 mg/dl (ref 70–99)
Potassium: 3.9 mEq/L (ref 3.5–5.1)
Sodium: 138 mEq/L (ref 136–145)
Total Bilirubin: 0.4 mg/dL (ref 0.20–1.20)

## 2012-02-13 MED ORDER — TRASTUZUMAB CHEMO INJECTION 440 MG
6.0000 mg/kg | Freq: Once | INTRAVENOUS | Status: AC
  Administered 2012-02-13: 336 mg via INTRAVENOUS
  Filled 2012-02-13: qty 16

## 2012-02-13 MED ORDER — ONDANSETRON 8 MG/50ML IVPB (CHCC)
8.0000 mg | Freq: Once | INTRAVENOUS | Status: AC
  Administered 2012-02-13: 8 mg via INTRAVENOUS

## 2012-02-13 MED ORDER — SODIUM CHLORIDE 0.9 % IV SOLN
Freq: Once | INTRAVENOUS | Status: AC
  Administered 2012-02-13: 15:00:00 via INTRAVENOUS

## 2012-02-13 MED ORDER — OXYCODONE-ACETAMINOPHEN 5-325 MG PO TABS
1.0000 | ORAL_TABLET | Freq: Once | ORAL | Status: AC
  Administered 2012-02-13: 1 via ORAL

## 2012-02-15 ENCOUNTER — Telehealth: Payer: Self-pay

## 2012-02-15 MED ORDER — CITALOPRAM HYDROBROMIDE 20 MG PO TABS: 40.0000 mg | ORAL_TABLET | Freq: Every day | ORAL | Status: DC

## 2012-02-15 NOTE — Telephone Encounter (Signed)
I do not see anywhere that she has discussed this with Dr. Patsy Lager.  It looks like the last time she was seen by Dr. Patsy Lager was March 2013 at which time Dr. Patsy Lager referred her to psychiatry for further evaluation and management.  However, I do not want her to be without her medication, so I have sent a 30 day supply of Celexa 40mg  to her pharmacy.  But she needs an office visit with either Dr. Patsy Lager or psychiatry before she runs out of this prescription to discuss the dose that is appropriate for her.

## 2012-02-15 NOTE — Telephone Encounter (Signed)
Spoke with pt advised message from [facility], pt understood.

## 2012-02-15 NOTE — Telephone Encounter (Signed)
Patient spoke to Dr. Patsy Lager recently about doubling celexa dose, she is now taking 2-20mg  tablets rather than 1 so she has run out of rx and needs a refill for the higher dosage.  Best [phone]

## 2012-02-15 NOTE — Telephone Encounter (Signed)
I do not see any indication of dose change, please advise

## 2012-02-21 ENCOUNTER — Encounter (HOSPITAL_COMMUNITY): Payer: Self-pay | Admitting: *Deleted

## 2012-02-21 ENCOUNTER — Emergency Department (HOSPITAL_COMMUNITY): Payer: Self-pay

## 2012-02-21 ENCOUNTER — Emergency Department (HOSPITAL_COMMUNITY)
Admission: EM | Admit: 2012-02-21 | Discharge: 2012-02-21 | Disposition: A | Discharge status: Home / Self Care | Payer: Self-pay | Attending: Emergency Medicine | Admitting: Emergency Medicine

## 2012-02-21 ENCOUNTER — Other Ambulatory Visit: Payer: Self-pay | Admitting: Oncology

## 2012-02-21 DIAGNOSIS — R0789 Other chest pain: Secondary | ICD-10-CM

## 2012-02-21 DIAGNOSIS — Z8614 Personal history of Methicillin resistant Staphylococcus aureus infection: Secondary | ICD-10-CM | POA: Insufficient documentation

## 2012-02-21 DIAGNOSIS — Z8659 Personal history of other mental and behavioral disorders: Secondary | ICD-10-CM | POA: Insufficient documentation

## 2012-02-21 DIAGNOSIS — C50919 Malignant neoplasm of unspecified site of unspecified female breast: Secondary | ICD-10-CM | POA: Insufficient documentation

## 2012-02-21 DIAGNOSIS — Z8669 Personal history of other diseases of the nervous system and sense organs: Secondary | ICD-10-CM | POA: Insufficient documentation

## 2012-02-21 DIAGNOSIS — R071 Chest pain on breathing: Secondary | ICD-10-CM | POA: Insufficient documentation

## 2012-02-21 DIAGNOSIS — F17200 Nicotine dependence, unspecified, uncomplicated: Secondary | ICD-10-CM | POA: Insufficient documentation

## 2012-02-21 DIAGNOSIS — M549 Dorsalgia, unspecified: Secondary | ICD-10-CM | POA: Insufficient documentation

## 2012-02-21 DIAGNOSIS — Z9889 Other specified postprocedural states: Secondary | ICD-10-CM | POA: Insufficient documentation

## 2012-02-21 DIAGNOSIS — Z79899 Other long term (current) drug therapy: Secondary | ICD-10-CM | POA: Insufficient documentation

## 2012-02-21 LAB — PREGNANCY, URINE: Preg Test, Ur: NEGATIVE

## 2012-02-21 LAB — CBC WITH DIFFERENTIAL/PLATELET
Basophils Absolute: 0 10*3/uL (ref 0.0–0.1)
Eosinophils Relative: 4 % (ref 0–5)
Lymphocytes Relative: 25 % (ref 12–46)
MCV: 94.3 fL (ref 78.0–100.0)
Neutro Abs: 4.4 10*3/uL (ref 1.7–7.7)
Neutrophils Relative %: 67 % (ref 43–77)
Platelets: 206 10*3/uL (ref 150–400)
RDW: 12 % (ref 11.5–15.5)
WBC: 6.5 10*3/uL (ref 4.0–10.5)

## 2012-02-21 LAB — URINALYSIS, ROUTINE W REFLEX MICROSCOPIC
Ketones, ur: 15 mg/dL — AB
Leukocytes, UA: NEGATIVE
Nitrite: NEGATIVE
pH: 5.5 (ref 5.0–8.0)

## 2012-02-21 LAB — BASIC METABOLIC PANEL
Calcium: 9.4 mg/dL (ref 8.4–10.5)
Creatinine, Ser: 0.79 mg/dL (ref 0.50–1.10)
GFR calc Af Amer: >90 mL/min (ref >90)
GFR calc non Af Amer: >90 mL/min (ref >90)
Sodium: 138 mEq/L (ref 135–145)

## 2012-02-21 MED ORDER — ONDANSETRON HCL 4 MG/2ML IJ SOLN
4.0000 mg | INTRAMUSCULAR | Status: DC | PRN
  Administered 2012-02-21: 4 mg via INTRAVENOUS
  Filled 2012-02-21: qty 2

## 2012-02-21 MED ORDER — DIAZEPAM 5 MG PO TABS
5.0000 mg | ORAL_TABLET | Freq: Once | ORAL | Status: AC
  Administered 2012-02-21: 5 mg via ORAL
  Filled 2012-02-21: qty 1

## 2012-02-21 MED ORDER — IOHEXOL 350 MG/ML SOLN
100.0000 mL | Freq: Once | INTRAVENOUS | Status: AC | PRN
  Administered 2012-02-21: 100 mL via INTRAVENOUS

## 2012-02-21 MED ORDER — SODIUM CHLORIDE 0.9 % IV SOLN
INTRAVENOUS | Status: DC
  Administered 2012-02-21: 1000 mL via INTRAVENOUS

## 2012-02-21 MED ORDER — HEPARIN SOD (PORK) LOCK FLUSH 100 UNIT/ML IV SOLN
INTRAVENOUS | Status: AC
  Filled 2012-02-21: qty 5

## 2012-02-21 MED ORDER — DIAZEPAM 5 MG PO TABS: 5.0000 mg | ORAL_TABLET | Freq: Two times a day (BID) | ORAL | Status: DC | PRN

## 2012-02-21 NOTE — ED Notes (Signed)
Pt reports right side breast pain and inflammation that radiates to mid right axilla and mid back x 30 days. States that she received a rx for doxycycline and pain medicine for the breast pain. States that she finished rx and pain meds a couple of weeks ago without relief. States that swelling has gotten worse and thinks that she may have an infection in the incision line from her mastectomy. Reports pain is 10/10.

## 2012-02-21 NOTE — ED Notes (Signed)
Pt reports R side pain for several weeks now but is getting worse.  Pt reports bila mastectomy more than a year ago.  Pt reports pain originates from the R incision area which radiates to her R axilla and around to her R side mid back.  Pt reports muscle spasms in her back.  Pt reports recent chemo tx x 2 weeks ago and gets it every 3 weeks.  Pt denies any f/c.  Pt also reports weakness and no energy.

## 2012-02-21 NOTE — ED Provider Notes (Signed)
History     CSN: [id number]  Arrival date & time 02/21/12  1144   First MD Initiated Contact with Patient 02/21/12 1339      Chief Complaint  Patient presents with  . Incisional Pain  . Back Pain     HPI Pt was seen at 1410.  Per pt, c/o gradual onset and persistence of constant acute flair of her chronic right mastectomy scar area "pain" for the past several months, worse over the past several weeks.  Describes the pain as "muscle spasms," which radiates from her right chest wall around to her scapular area.  Pt states she is "scared I have an infection."  States she called her Heme/Onc MD and was told to come to the ED "for at CT scan."  Denies CP/palpitations, no SOB/cough, no fevers, no rash, no injury, no abd pain, no N/V/D, no focal motor weakness, no tingling/numbness in extremities.    Heme/Onc: Dr. [name] Past Medical History  Diagnosis Date  . Allergy   . Right breast ca dx'd 10/2010  . Breast cancer 12/03/10  . Status post chemotherapy     docetaxel/carboplatin/trastuzumab  . Anxiety   . Seizures     last seizure 2 years ago, diagnosed 3 years ago  . Hx MRSA infection 03/28/11    Skin    Past Surgical History  Procedure Date  . Wisdom tooth extraction   . Breast surgery 11/16/2010    bilateral mastectomy+Ralnd,T1cN1a, Her2+,ERPR+  . Biopsy breast 10/14/2010    right needle core biopsy  . Portacath placement 11/16/2010    placement of left subclavian port  . I&d extremity 09/26/2011    Procedure: IRRIGATION AND DEBRIDEMENT EXTREMITY;  Surgeon: [name] Ribas, MD;  Location: [facility];  Service: Orthopedics;  Laterality: Right;  I&D right hand cat bite wound    Family History  Problem Relation Age of Onset  . Cancer Maternal Grandfather     History  Substance Use Topics  . Smoking status: Current Some Day Smoker -- 2.0 packs/day for 20 years    Types: Cigarettes  . Smokeless tobacco: Never Used  . Alcohol Use: Yes     occa    OB History    Grav Para  Term Preterm Abortions TAB SAB Ect Mult Living   1 1 1  0 0 0 0 0 0 0      Review of Systems ROS: Statement: All systems negative except as marked or noted in the HPI; Constitutional: Negative for fever and chills. ; ; Eyes: Negative for eye pain, redness and discharge. ; ; ENMT: Negative for ear pain, hoarseness, nasal congestion, sinus pressure and sore throat. ; ; Cardiovascular: Negative for chest pain, palpitations, diaphoresis, dyspnea and peripheral edema. ; ; Respiratory: Negative for cough, wheezing and stridor. ; ; Gastrointestinal: Negative for nausea, vomiting, diarrhea, abdominal pain, blood in stool, hematemesis, jaundice and rectal bleeding. . ; ; Genitourinary: Negative for dysuria, flank pain and hematuria. ; ; Musculoskeletal: +right chest wall pain, muscle spasms. Negative for back pain and neck pain. Negative for swelling and trauma.; ; Skin: Negative for pruritus, rash, abrasions, blisters, bruising and skin lesion.; ; Neuro: Negative for headache, lightheadedness and neck stiffness. Negative for weakness, altered level of consciousness , altered mental status, extremity weakness, paresthesias, involuntary movement, seizure and syncope.      Allergies  Phenytoin; Thorazine; Ciprofloxacin; and Vancomycin  Home Medications   Current Outpatient Rx  Name Route Sig Dispense Refill  . ALPRAZOLAM 0.5 MG PO TABS  Oral Take 0.5 mg by mouth daily. Take one or two pills three times daily as needed for anxiety    . CARVEDILOL 3.125 MG PO TABS Oral Take 3.125 mg by mouth 2 (two) times daily with a meal. Take along with 6.25mg     . CARVEDILOL 6.25 MG PO TABS Oral Take 6.25 mg by mouth 2 (two) times daily with a meal. Take along with 3.75mg     . CETIRIZINE-PSEUDOEPHEDRINE ER 5-120 MG PO TB12 Oral Take 1 tablet by mouth once.    Marland Kitchen CITALOPRAM HYDROBROMIDE 20 MG PO TABS Oral Take 40 mg by mouth daily.    . GOSERELIN ACETATE 10.8 MG [place] IMPL Subcutaneous Inject 10.8 mg into the skin every 3  (three) months.    Marland Kitchen PROMETHAZINE HCL 25 MG PO TABS Oral Take 25 mg by mouth. Take 1 tablet before meals and at bed time for nausea    . PROMETHAZINE HCL 25 MG PO TABS      . RANITIDINE HCL 150 MG PO CAPS Oral Take 150 mg by mouth 2 (two) times daily as needed. For heartburn    . TAMOXIFEN CITRATE 20 MG PO TABS Oral Take 20 mg by mouth daily.      BP 135/95  Pulse 82  Temp 98.4 F (36.9 C) (Oral)  Resp 20  SpO2 98%  Physical Exam 1415: Physical examination:  Nursing notes reviewed; Vital signs and O2 SAT reviewed;  Constitutional: Well developed, Well nourished, Well hydrated, In no acute distress; Head:  Normocephalic, atraumatic; Eyes: EOMI, PERRL, No scleral icterus; ENMT: Mouth and pharynx normal, Mucous membranes moist; Neck: Supple, Full range of motion, No lymphadenopathy; Cardiovascular: Regular rate and rhythm, No gallop; Respiratory: Breath sounds clear & equal bilaterally, No rales, rhonchi, wheezes.  Speaking full sentences with ease, Normal respiratory effort/excursion; Chest: +tender right chest wall around mastectomy scar without erythema, edema, fluctuance, or open wounds, Movement normal; Spine:  No midline CS, TS, LS tenderness. +TTP right thoracic paraspinal muscles. No rash.;;  Abdomen: Soft, Nontender, Nondistended, Normal bowel sounds; Genitourinary: No CVA tenderness; Extremities: Pulses normal, No tenderness, No edema, No calf edema or asymmetry.; Neuro: AA&Ox3, Major CN grossly intact.  Speech clear. No gross focal motor or sensory deficits in extremities.; Skin: Color normal, Warm, Dry.; Psych:  Anxious.     ED Course  Procedures   MDM  MDM Reviewed: previous chart, nursing note and vitals Interpretation: labs, x-ray and CT scan     Results for orders placed during the hospital encounter of 02/21/12  BASIC METABOLIC PANEL      Component Value Range   Sodium 138  135 - 145 mEq/L   Potassium 3.8  3.5 - 5.1 mEq/L   Chloride 102  96 - 112 mEq/L   CO2 26  19 -  32 mEq/L   Glucose, Bld 82  70 - 99 mg/dL   BUN 11  6 - 23 mg/dL   Creatinine, Ser 1.61  0.50 - 1.10 mg/dL   Calcium 9.4  8.4 - 09.6 mg/dL   GFR calc non Af Amer >90  >90 mL/min   GFR calc Af Amer >90  >90 mL/min  CBC WITH DIFFERENTIAL      Component Value Range   WBC 6.5  4.0 - 10.5 K/uL   RBC 4.23  3.87 - 5.11 MIL/uL   Hemoglobin 14.3  12.0 - 15.0 g/dL   HCT 04.5  40.9 - 81.1 %   MCV 94.3  78.0 - 100.0 fL  MCH 33.8  26.0 - 34.0 pg   MCHC 35.8  30.0 - 36.0 g/dL   RDW 78.2  95.6 - 21.3 %   Platelets 206  150 - 400 K/uL   Neutrophils Relative 67  43 - 77 %   Neutro Abs 4.4  1.7 - 7.7 K/uL   Lymphocytes Relative 25  12 - 46 %   Lymphs Abs 1.6  0.7 - 4.0 K/uL   Monocytes Relative 4  3 - 12 %   Monocytes Absolute 0.3  0.1 - 1.0 K/uL   Eosinophils Relative 4  0 - 5 %   Eosinophils Absolute 0.2  0.0 - 0.7 K/uL   Basophils Relative 1  0 - 1 %   Basophils Absolute 0.0  0.0 - 0.1 K/uL  URINALYSIS, ROUTINE W REFLEX MICROSCOPIC      Component Value Range   Color, Urine YELLOW  YELLOW   APPearance CLEAR  CLEAR   Specific Gravity, Urine 1.012  1.005 - 1.030   pH 5.5  5.0 - 8.0   Glucose, UA NEGATIVE  NEGATIVE mg/dL   Hgb urine dipstick NEGATIVE  NEGATIVE   Bilirubin Urine NEGATIVE  NEGATIVE   Ketones, ur 15 (*) NEGATIVE mg/dL   Protein, ur NEGATIVE  NEGATIVE mg/dL   Urobilinogen, UA 0.2  0.0 - 1.0 mg/dL   Nitrite NEGATIVE  NEGATIVE   Leukocytes, UA NEGATIVE  NEGATIVE  PREGNANCY, URINE      Component Value Range   Preg Test, Ur NEGATIVE  NEGATIVE   Dg Chest 2 View 02/21/2012  *RADIOLOGY REPORT*  Clinical Data: Right chest pain  CHEST - 2 VIEW  Comparison: 10/14/2011  Findings: Cardiomediastinal silhouette is stable.  Left subclavian Port-A-Cath is unchanged in position.  No acute infiltrate or pulmonary edema.  Surgical clips in right axilla again noted.  IMPRESSION: No active disease.  No significant change.   Original Report Authenticated By: Natasha Mead, M.D.    Ct Angio Chest Pe  W/cm &/or Wo Cm 02/21/2012  *RADIOLOGY REPORT*  Clinical Data: Weakness.  Right chest pain.  Breast cancer.  CT ANGIOGRAPHY CHEST  Technique:  Multidetector CT imaging of the chest using the standard protocol during bolus administration of intravenous contrast. Multiplanar reconstructed images including MIPs were obtained and reviewed to evaluate the vascular anatomy.  Contrast: OMNIPAQUE IOHEXOL 350 MG/ML SOLN  Comparison: Multiple exams, including 02/21/2012 and 10/17/2011  Findings: No filling defect is identified in the pulmonary arterial tree to suggest pulmonary embolus.  Aberrant right subclavian artery noted passing behind the esophagus.  No pathologic thoracic adenopathy observed.  Reduction and anterior opacities in the right upper lobe compared to prior no significant osseous abnormality observed.  Bilateral mastectomy noted.  IMPRESSION:  1. No filling defect is identified in the pulmonary arterial tree to suggest pulmonary embolus. 2.  Reduced anterior opacity in the right upper lobe and right middle lobe, likely related to prior radiation therapy.   Original Report Authenticated By: Dellia Cloud, M.D.      1640:  Pt tol PO well while in the ED.  Valium has started to improve her "spasms."  Pt wants to go home now.  Dx and testing d/w pt.  Questions answered.  Verb understanding, agreeable to d/c home with outpt f/u.       Laray Anger, DO 02/22/12 Ernestina [place]

## 2012-02-21 NOTE — Progress Notes (Signed)
ID: Crystal [name]   DOB: Feb 14, 1976  MR#: [id number]  WUJ#:[id number]  HISTORY OF PRESENT ILLNESS: The patient is a 36 year old [place] woman who noted a mass in her right breast and brought it to her primary care physician's attention June of 2012. She was set up for mammography at [facility], where an area of calcifications highly suspicious for carcinoma was biopsied. This was in the upper outer quadrant and it measured 1.7 cm mammographically and 1.8 cm by ultrasonography. The pathology report ([id number]) showed a high-grade invasive ductal carcinoma estrogen receptor positive at 64% progesterone receptor positive at 18%, and HER-2 amplified with a ratio by CISH of 5.24. MIB-1-1 was 65%. A second mass biopsied at the same time was also triple positive.  Given her multiple masses in the right breast, mastectomy was recommended. The patient actually opted for bilateral mastectomies and this was performed together with a right axillary lymph node dissection under Dr. Felicity Pellegrini on 11/16/2010. The final pathology [id number]) showed, on the left, no evidence of cancer. On the right the patient had a high-grade invasive ductal carcinoma measuring 1.9 cm, with negative margins, grade 3, involving 2 of 16 lymph nodes (stage IIB).  Her subsequent history is as detailed below.  INTERVAL HISTORY:  Crystal [name] returns today accompanied by her husband, Crystal [name], for an unscheduled visit. She called reporting that she was having more pain in the right chest wall area and "don't feel like myself".  REVIEW OF SYSTEMS: She is feeling very tired, does wants to go to bed, "doesn't feel right", but has not had any fever, rash, bleeding, cough, phlegm production, pleurisy, worsening shortness of breath, worsening headaches (she tells me her migraines have not changed significantly), some nausea but no vomiting, no change in bowel or bladder habits. The pain is in the right chest wall area and she thinks that area is swollen as  well. She gets jerkiness or spasms in the back of on the right side. Her husband call also feels that she is "very different from her usual". Otherwise the review of systems today was noncontributory.   PAST MEDICAL HISTORY: Past Medical History  Diagnosis Date  . Allergy   . Right breast ca dx'd 10/2010  . Breast cancer 12/03/10  . Status post chemotherapy     docetaxel/carboplatin/trastuzumab  . Anxiety   . Seizures     last seizure 2 years ago, diagnosed 3 years ago  . Hx MRSA infection 03/28/11    Skin  1. History of seizures. The patient has been thoroughly evaluated both here by Dr. [name] and at [facility] for this. Among many studies, she had an MRI of the brain on August 28th. This showed an incidental solitary small lesion in the left frontoparietal white matter. It was not felt to be suggestive of multiple sclerosis. A noncontrasted CT in August 2010 was negative. The patient has been off all seizure medications now for 2 months. There has been no evidence of seizure recurrence. 2. Complex psychology history (please refer to Dr. [name] note in the E-chart from August 29, 2009) with evidence of anxiety disorder, depression and psychosis. The patient is currently on no medications for this and denies any of these symptoms at present. 3. History of migraines. 4. Remote history of multidrug abuse. 5. Status post wisdom teeth extraction.  History of MRSA skin infection   PAST SURGICAL HISTORY: Past Surgical History  Procedure Date  . Wisdom tooth extraction   . Breast surgery 11/16/2010  bilateral mastectomy+Ralnd,T1cN1a, Her2+,ERPR+  . Biopsy breast 10/14/2010    right needle core biopsy  . Portacath placement 11/16/2010    placement of left subclavian port  . I&d extremity 09/26/2011    Procedure: IRRIGATION AND DEBRIDEMENT EXTREMITY;  Surgeon: [name] Ribas, MD;  Location: [facility];  Service: Orthopedics;  Laterality: Right;  I&D right hand cat bite wound    FAMILY  HISTORY Family History  Problem Relation Age of Onset  . Cancer Maternal Grandfather   The patient's mother is living, in her mid 37s, with no history of breast or ovarian cancer. The patient does not know her biological father. The patient has an older brother with bipolar disease, and according to the patient, borderline schizophrenia.   GYNECOLOGIC HISTORY: Menarche at age 77. She is G1, P1 with first pregnancy to term at age 24. The patient had significant pelvic pain recently and was evaluated with transvaginal and pelvic ultrasound on July 17th by Dr. Natale Milch. This showed normal uterine myometrium and endometrium with bilateral functional ovarian cysts. There were no worrisome features. She is currently on every three-month goserelin.  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 Her husband of 4 years, Crystal [name], works as a Administrator. In addition to them at home is the patient's son, Crystal [name], 69 years old attends [employer] in the tenth grade. [name], according to the patient, has significant nausea problems. The patient attends a local [address]. [name] is pastor.    ADVANCED DIRECTIVES:  HEALTH MAINTENANCE: History  Substance Use Topics  . Smoking status: Current Some Day Smoker -- 2.0 packs/day for 20 years    Types: Cigarettes  . Smokeless tobacco: Never Used  . Alcohol Use: No     quit drinking 6 months     Colonoscopy:  PAP:  Bone density:  Lipid panel:  Allergies  Allergen Reactions  . Phenytoin Nausea And Vomiting  . Thorazine (Chlorpromazine Hcl) Hives  . Ciprofloxacin Nausea And Vomiting  . Vancomycin Rash    Possible rash reported by MD    Current Outpatient Prescriptions  Medication Sig Dispense Refill  . ALPRAZolam (XANAX) 0.5 MG tablet Take one or two pills three times daily as needed for anxiety  180 tablet  4  . carvedilol (COREG) 3.125 MG tablet Take 1 tablet (3.125 mg  total) by mouth 2 (two) times daily with a meal. Take along with 6.25mg   30 tablet  6  . carvedilol (COREG) 6.25 MG tablet Take 1 tablet (6.25 mg total) by mouth 2 (two) times daily with a meal. Take along with 3.75mg   30 tablet  6  . cetirizine-pseudoephedrine (ZYRTEC-D) 5-120 MG per tablet Take 1 tablet by mouth once.      . citalopram (CELEXA) 20 MG tablet Take 2 tablets (40 mg total) by mouth daily.  30 tablet  0  . doxycycline (VIBRA-TABS) 100 MG tablet Take 1 tablet (100 mg total) by mouth daily.  7 tablet  0  . goserelin (ZOLADEX) 10.8 MG injection Inject 10.8 mg into the skin every 3 (three) months.      Marland Kitchen HYDROcodone-acetaminophen (LORTAB 10) 10-500 MG per tablet Take 1 tablet by mouth every 6 (six) hours as needed for pain.  30 tablet  0  . polyethylene glycol (MIRALAX / GLYCOLAX) packet Take 17 g by mouth daily as needed. constipation      . promethazine (PHENERGAN) 25 MG tablet TAKE ONE  TABLET BY MOUTH BEFORE MEALS & AT BEDTIME AS NEEDED FORNAUSEA  60 tablet  3  . ranitidine (ZANTAC) 150 MG capsule Take 150 mg by mouth 2 (two) times daily as needed. For heartburn      . tamoxifen (NOLVADEX) 20 MG tablet Take 20 mg by mouth daily.       OBJECTIVE: There were no vitals filed for this visit.  Limited exam of the area in question shows no obvious swelling over the right chest wall, anteriorly or posteriorly. The flesh above the mastectomy on that side is slightly more prominent than on the left side. There is no erythema. There is no obvious tenderness to palpation. Overall I don't see a significant change although the patient feels there is a significant change from prior.   LAB RESULTS: Lab Results  Component Value Date   WBC 8.8 02/13/2012   NEUTROABS 6.0 02/13/2012   HGB 14.4 02/13/2012   HCT 40.3 02/13/2012   MCV 93.9 02/13/2012   PLT 242 02/13/2012      Chemistry      Component Value Date/Time   NA 138 02/13/2012 1440   NA 137 12/12/2011 1451   K 3.9 02/13/2012 1440    K 3.6 12/12/2011 1451   CL 105 02/13/2012 1440   CL 100 12/12/2011 1451   CO2 24 02/13/2012 1440   CO2 27 12/12/2011 1451   BUN 14.0 02/13/2012 1440   BUN 15 12/12/2011 1451   CREATININE 0.8 02/13/2012 1440   CREATININE 0.88 12/12/2011 1451      Component Value Date/Time   CALCIUM 9.9 02/13/2012 1440   CALCIUM 8.8 12/12/2011 1451   ALKPHOS 79 02/13/2012 1440   ALKPHOS 84 12/12/2011 1451   AST 26 02/13/2012 1440   AST 18 12/12/2011 1451   ALT 24 02/13/2012 1440   ALT 11 12/12/2011 1451   BILITOT 0.40 02/13/2012 1440   BILITOT 0.4 12/12/2011 1451       Lab Results  Component Value Date   LABCA2 16 08/24/2011     STUDIES:  10/31/2011 [facility]* *[facility]* [address] [place], [place] [address] [phone]  ------------------------------------------------------------ Transthoracic Echocardiography  Patient: Dorlisa, Savino MR #: [id number] Study Date: 10/31/2011 Gender: F Age: 77 Height: 162.6cm Weight: 53.5kg BSA: 1.57m^2 Pt. Status: Room:  PERFORMING [place], [facility] REFERRING [place], [place] SONOGRAPHER [place], Lauren Forbes Cellar, Amy ATTENDING Bensimhon, [name] cc:  ------------------------------------------------------------ LV EF: 55% - 60%  ------------------------------------------------------------ History: PMH: 174.9 Risk factors: Breast Cancer.  ------------------------------------------------------------ Study Conclusions  Left ventricle: The cavity size was normal. Wall thickness was normal. Systolic function was normal. The estimated ejection fraction was in the range of 55% to 60%. Wall motion was normal; there were no regional wall motion abnormalities. Left ventricular diastolic function parameters were normal. Transthoracic echocardiography. M-mode, complete 2D, spectral Doppler, and color Doppler. Height: Height: 162.6cm. Height: 64in. Weight: Weight: 53.5kg. Weight: 117.8lb. Body mass  index: BMI: 20.3kg/m^2. Body surface area: BSA: 1.33m^2. Blood pressure: 107/66. Patient status: Outpatient. Location: Echo laboratory.    ASSESSMENT: 36 year old [place] woman, known to be BRCA1 and 2 negative,   (1) status post bilateral mastectomies May of 2012 for a right-sided T1c N1 (stage II) grade 3 invasive ductal carcinoma,estrogen and progesterone receptor positive, HER-2 amplified, with an MIB-1-1 of 85%.   (2) status post carboplatin/docetaxol/trastuzumab x6, completed mid December 2012   (3) on trastuzumab every 3 weeks, started August of 2012, interrupted between April and August of 2013. She will complete  one year at the end of this year.   (4) status post radiation therapy, completed 07/12/2011  (5)  On tamoxifen since April 2013   (6) On goserelin given every 3 months, 1st injection on 10/26/11.  PLAN:  Jamariyah is clearly feeling "different". She feels there is a new problem on the right chest wall. Possibly this is all musculoskeletal and related to her prior surgery, but she "just doesn't feel like herself". I offered at to obtain a CT of the chest, but it may take a couple of days to do that, and she and her husband felt it was better for her to be evaluated today, accordingly she will present to the emergency room. Otherwise she is a ready scheduled for a visit and her next trastuzumab treatment November 4, with an echocardiogram shortly to follow.  [name],[name]    02/21/2012

## 2012-02-21 NOTE — ED Notes (Signed)
Pt ambulated to the BR with steady gait.  Instructed to collect urine just in case UA is needed.  Pt's husband states that from what he was told by Dr. [name] that pt needs CT.  Checked with Diane CN, she reports that she spoke to Dr. [name] but did not mention about pt needing CT.

## 2012-02-22 LAB — URINE CULTURE

## 2012-03-05 ENCOUNTER — Telehealth: Payer: Self-pay | Admitting: Oncology

## 2012-03-05 ENCOUNTER — Ambulatory Visit (HOSPITAL_BASED_OUTPATIENT_CLINIC_OR_DEPARTMENT_OTHER): Payer: Self-pay

## 2012-03-05 ENCOUNTER — Other Ambulatory Visit: Payer: Self-pay | Admitting: *Deleted

## 2012-03-05 ENCOUNTER — Other Ambulatory Visit: Payer: Self-pay | Admitting: Physician Assistant

## 2012-03-05 ENCOUNTER — Other Ambulatory Visit: Payer: Self-pay | Admitting: Lab

## 2012-03-05 ENCOUNTER — Ambulatory Visit (HOSPITAL_BASED_OUTPATIENT_CLINIC_OR_DEPARTMENT_OTHER): Payer: Self-pay | Admitting: Physician Assistant

## 2012-03-05 ENCOUNTER — Encounter: Payer: Self-pay | Admitting: Physician Assistant

## 2012-03-05 ENCOUNTER — Other Ambulatory Visit (HOSPITAL_BASED_OUTPATIENT_CLINIC_OR_DEPARTMENT_OTHER): Payer: Self-pay | Admitting: Lab

## 2012-03-05 VITALS — BP 137/92 | HR 75 | Temp 98.2°F | Resp 20 | Ht 64.0 in | Wt 127.0 lb

## 2012-03-05 DIAGNOSIS — C50919 Malignant neoplasm of unspecified site of unspecified female breast: Secondary | ICD-10-CM

## 2012-03-05 DIAGNOSIS — Z23 Encounter for immunization: Secondary | ICD-10-CM

## 2012-03-05 DIAGNOSIS — Z5112 Encounter for antineoplastic immunotherapy: Secondary | ICD-10-CM

## 2012-03-05 DIAGNOSIS — R071 Chest pain on breathing: Secondary | ICD-10-CM

## 2012-03-05 DIAGNOSIS — Z170 Estrogen receptor positive status [ER+]: Secondary | ICD-10-CM

## 2012-03-05 DIAGNOSIS — C773 Secondary and unspecified malignant neoplasm of axilla and upper limb lymph nodes: Secondary | ICD-10-CM

## 2012-03-05 LAB — COMPREHENSIVE METABOLIC PANEL (CC13)
Albumin: 4.3 g/dL (ref 3.5–5.0)
BUN: 11 mg/dL (ref 7.0–26.0)
CO2: 24 mEq/L (ref 22–29)
Calcium: 9.6 mg/dL (ref 8.4–10.4)
Chloride: 109 mEq/L — ABNORMAL HIGH (ref 98–107)
Glucose: 96 mg/dl (ref 70–99)
Potassium: 3.8 mEq/L (ref 3.5–5.1)

## 2012-03-05 LAB — CBC WITH DIFFERENTIAL/PLATELET
Basophils Absolute: 0 10*3/uL (ref 0.0–0.1)
HCT: 39.2 % (ref 34.8–46.6)
HGB: 13.9 g/dL (ref 11.6–15.9)
MCH: 33.3 pg (ref 25.1–34.0)
MONO#: 0.5 10*3/uL (ref 0.1–0.9)
NEUT%: 72.8 % (ref 38.4–76.8)
WBC: 10.2 10*3/uL (ref 3.9–10.3)
lymph#: 2 10*3/uL (ref 0.9–3.3)

## 2012-03-05 MED ORDER — TRASTUZUMAB CHEMO INJECTION 440 MG
6.0000 mg/kg | Freq: Once | INTRAVENOUS | Status: AC
  Administered 2012-03-05: 336 mg via INTRAVENOUS
  Filled 2012-03-05: qty 16

## 2012-03-05 MED ORDER — SODIUM CHLORIDE 0.9 % IV SOLN
Freq: Once | INTRAVENOUS | Status: AC
  Administered 2012-03-05: 15:00:00 via INTRAVENOUS

## 2012-03-05 MED ORDER — SODIUM CHLORIDE 0.9 % IJ SOLN
10.0000 mL | INTRAMUSCULAR | Status: DC | PRN
  Administered 2012-03-05: 10 mL
  Filled 2012-03-05: qty 10

## 2012-03-05 MED ORDER — ONDANSETRON 8 MG/50ML IVPB (CHCC)
8.0000 mg | Freq: Once | INTRAVENOUS | Status: AC
  Administered 2012-03-05: 8 mg via INTRAVENOUS

## 2012-03-05 MED ORDER — HYDROCODONE-ACETAMINOPHEN 5-325MG PREPACK (~~LOC~~: ORAL_TABLET | ORAL | Status: DC

## 2012-03-05 MED ORDER — INFLUENZA VIRUS VACC SPLIT PF IM SUSP
0.5000 mL | Freq: Once | INTRAMUSCULAR | Status: AC
  Administered 2012-03-05: 0.5 mL via INTRAMUSCULAR
  Filled 2012-03-05: qty 0.5

## 2012-03-05 MED ORDER — OXYCODONE-ACETAMINOPHEN 5-325 MG PO TABS
1.0000 | ORAL_TABLET | Freq: Once | ORAL | Status: AC
  Administered 2012-03-05: 1 via ORAL

## 2012-03-05 MED ORDER — HEPARIN SOD (PORK) LOCK FLUSH 100 UNIT/ML IV SOLN
500.0000 [IU] | Freq: Once | INTRAVENOUS | Status: AC | PRN
  Administered 2012-03-05: 500 [IU]
  Filled 2012-03-05: qty 5

## 2012-03-05 NOTE — Telephone Encounter (Signed)
lmonvm adviisng the pt of her appt with dr toth for this coming Thursday 03/08/2012@4 :40pm. Asked the pt to pick up a new revised dec 2013 appt calendar

## 2012-03-05 NOTE — Progress Notes (Signed)
ID: Crystal [name]   DOB: [birth year]  MR#: [id number]  NWG#:[id number]  HISTORY OF PRESENT ILLNESS: The patient is a 36 year old [place] woman who noted a mass in her right breast and brought it to her primary care physician's attention June of 2012. She was set up for mammography at [facility], where an area of calcifications highly suspicious for carcinoma was biopsied. This was in the upper outer quadrant and it measured 1.7 cm mammographically and 1.8 cm by ultrasonography. The pathology report ([id number]) showed a high-grade invasive ductal carcinoma estrogen receptor positive at 64% progesterone receptor positive at 18%, and HER-2 amplified with a ratio by CISH of 5.24. MIB-1-1 was 65%. A second mass biopsied at the same time was also triple positive.  Given her multiple masses in the right breast, mastectomy was recommended. The patient actually opted for bilateral mastectomies and this was performed together with a right axillary lymph node dissection under Dr. Felicity Pellegrini on 11/16/2010. The final pathology [id number]) showed, on the left, no evidence of cancer. On the right the patient had a high-grade invasive ductal carcinoma measuring 1.9 cm, with negative margins, grade 3, involving 2 of 16 lymph nodes (stage IIB).  Her subsequent history is as detailed below.  INTERVAL HISTORY:  Crystal [name] returns today for followup of her breast carcinoma, due for her next q. three-week dose of trastuzumab. She continues to receive Zoladex injections every 3 months, last given 01/23/2012. She is on tamoxifen daily. She is tolerating all of these treatments well.  Interval history is remarkable for Crystal [name] having been seen for pain in the right chest wall which she describes as "muscle spasms". A CT/Angio obtained on 02/21/2012 in the emergency room was unremarkable. She utilizes an occasional hydrocodone/APAP which she finds most helpful for the pain. She has difficulty tolerating multiple medications. (Of note  she had an appointment for followup with Dr. Carolynne Edouard recently, but tells me she canceled  the appointment due to financial concerns.)   REVIEW OF SYSTEMS: She continues to feel tired and complains of fatigue. She's had no fevers, chills, or night sweats. She denies any skin changes and has had no signs of abnormal bleeding. She has occasional nausea but no emesis. No change in bowel habits or bladder habits. She denies any new cough, phlegm production, increased shortness of breath, or palpitations. She has occasional headaches but these have not worsened, and she denies any dizziness, change in vision, or seizure activity. She denies any additional pain other than that noted above, specifically no unusual myalgias, arthralgias, or bony pain. She's had no peripheral swelling.  A detailed review of systems is otherwise stable and noncontributory.   PAST MEDICAL HISTORY: Past Medical History  Diagnosis Date  . Allergy   . Right breast ca dx'd 10/2010  . Breast cancer 12/03/10  . Status post chemotherapy     docetaxel/carboplatin/trastuzumab  . Anxiety   . Seizures     last seizure 2 years ago, diagnosed 3 years ago  . Hx MRSA infection 03/28/11    Skin  1. History of seizures. The patient has been thoroughly evaluated both here by Dr. [name] and at [facility] for this. Among many studies, she had an MRI of the brain on August 28th. This showed an incidental solitary small lesion in the left frontoparietal white matter. It was not felt to be suggestive of multiple sclerosis. A noncontrasted CT in August 2010 was negative. The patient has been off all seizure medications now for 2 months. There  has been no evidence of seizure recurrence. 2. Complex psychology history (please refer to Dr. [name] note in the E-chart from August 29, 2009) with evidence of anxiety disorder, depression and psychosis. The patient is currently on no medications for this and denies any of these symptoms at  present. 3. History of migraines. 4. Remote history of multidrug abuse. 5. Status post wisdom teeth extraction.  History of MRSA skin infection   PAST SURGICAL HISTORY: Past Surgical History  Procedure Date  . Wisdom tooth extraction   . Breast surgery 11/16/2010    bilateral mastectomy+Ralnd,T1cN1a, Her2+,ERPR+  . Biopsy breast 10/14/2010    right needle core biopsy  . Portacath placement 11/16/2010    placement of left subclavian port  . I&d extremity 09/26/2011    Procedure: IRRIGATION AND DEBRIDEMENT EXTREMITY;  Surgeon: [name] Ribas, MD;  Location: [facility];  Service: Orthopedics;  Laterality: Right;  I&D right hand cat bite wound    FAMILY HISTORY Family History  Problem Relation Age of Onset  . Cancer Maternal Grandfather   The patient's mother is living, in her mid 69s, with no history of breast or ovarian cancer. The patient does not know her biological father. The patient has an older brother with bipolar disease, and according to the patient, borderline schizophrenia.   GYNECOLOGIC HISTORY: Menarche at age 65. She is G1, P1 with first pregnancy to term at age 81. The patient had significant pelvic pain recently and was evaluated with transvaginal and pelvic ultrasound on July 17th by Dr. Natale Milch. This showed normal uterine myometrium and endometrium with bilateral functional ovarian cysts. There were no worrisome features. She is currently on every three-month goserelin.  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 Her husband of 4 years, Crystal [name], works as a Administrator. In addition to them at home is the patient's son, Crystal [name], 64 years old attends [employer] in the tenth grade. Crystal [name], according to the patient, has significant nausea problems. The patient attends a local [address]. [name] is pastor.    ADVANCED DIRECTIVES:  HEALTH MAINTENANCE: History  Substance Use Topics  . Smoking  status: Current Some Day Smoker -- 2.0 packs/day for 20 years    Types: Cigarettes  . Smokeless tobacco: Never Used  . Alcohol Use: Yes     Comment: occa     Colonoscopy:  PAP:  Bone density:  Lipid panel:  Allergies  Allergen Reactions  . Phenytoin Nausea And Vomiting  . Thorazine (Chlorpromazine Hcl) Hives  . Ciprofloxacin Nausea And Vomiting  . Vancomycin Rash    Possible rash reported by MD    Current Outpatient Prescriptions  Medication Sig Dispense Refill  . ALPRAZolam (XANAX) 0.5 MG tablet Take 0.5 mg by mouth daily. Take one or two pills three times daily as needed for anxiety      . carvedilol (COREG) 3.125 MG tablet Take 3.125 mg by mouth 2 (two) times daily with a meal. Take along with 6.25mg       . carvedilol (COREG) 6.25 MG tablet Take 6.25 mg by mouth 2 (two) times daily with a meal. Take along with 3.75mg       . cetirizine-pseudoephedrine (ZYRTEC-D) 5-120 MG per tablet Take 1 tablet by mouth once.      . citalopram (CELEXA) 20 MG tablet Take 40 mg by mouth daily.      . diazepam (VALIUM) 5 MG tablet Take 1 tablet (5 mg  total) by mouth 2 (two) times daily as needed (muscle spasm).  10 tablet  0  . goserelin (ZOLADEX) 10.8 MG injection Inject 10.8 mg into the skin every 3 (three) months.      . promethazine (PHENERGAN) 25 MG tablet Take 25 mg by mouth. Take 1 tablet before meals and at bed time for nausea      . promethazine (PHENERGAN) 25 MG tablet       . ranitidine (ZANTAC) 150 MG capsule Take 150 mg by mouth 2 (two) times daily as needed. For heartburn      . tamoxifen (NOLVADEX) 20 MG tablet Take 20 mg by mouth daily.       OBJECTIVE: Young white female who appears uncomfortable, but in no acute distress. Filed Vitals:   03/05/12 1411  BP: 137/92  Pulse: 75  Temp: 98.2 F (36.8 C)  Resp: 20  ECOG: 1 Filed Weights   03/05/12 1411  Weight: 127 lb (57.607 kg)   Physical Exam: HEENT:  Sclerae anicteric.  Oropharynx clear.  Nodes:  No cervical or  supraclavicular lymphadenopathy palpated.  Breast Exam: Patient is status post bilateral mastectomies. Careful exam of the right chest wall, right axilla, and right scapular area was benign with no suspicious nodularities or skin changes. No erythema or edema noted. Axillae are benign bilaterally with no adenopathy palpated Lungs:  Clear to auscultation bilaterally.   Heart:  Regular rate and rhythm.   Abdomen:  Soft, nontender.  Positive bowel sounds.  Musculoskeletal:  No focal spinal tenderness to palpation.  Extremities:  Benign.   Neuro:  Nonfocal, alert and oriented x3   LAB RESULTS: Lab Results  Component Value Date   WBC 10.2 03/05/2012   NEUTROABS 7.4* 03/05/2012   HGB 13.9 03/05/2012   HCT 39.2 03/05/2012   MCV 93.8 03/05/2012   PLT 165 03/05/2012      Chemistry      Component Value Date/Time   NA 138 02/21/2012 1410   NA 138 02/13/2012 1440   K 3.8 02/21/2012 1410   K 3.9 02/13/2012 1440   CL 102 02/21/2012 1410   CL 105 02/13/2012 1440   CO2 26 02/21/2012 1410   CO2 24 02/13/2012 1440   BUN 11 02/21/2012 1410   BUN 14.0 02/13/2012 1440   CREATININE 0.79 02/21/2012 1410   CREATININE 0.8 02/13/2012 1440      Component Value Date/Time   CALCIUM 9.4 02/21/2012 1410   CALCIUM 9.9 02/13/2012 1440   ALKPHOS 79 02/13/2012 1440   ALKPHOS 84 12/12/2011 1451   AST 26 02/13/2012 1440   AST 18 12/12/2011 1451   ALT 24 02/13/2012 1440   ALT 11 12/12/2011 1451   BILITOT 0.40 02/13/2012 1440   BILITOT 0.4 12/12/2011 1451       Lab Results  Component Value Date   LABCA2 16 08/24/2011     STUDIES:  Most recent echocardiogram on 01/18/2012 showed an ejection fraction of 50%. These are being followed by Dr.  Prescott Gum office, and the patient scheduled for repeat echocardiogram on November 18.     ASSESSMENT: 36 year old [place] woman, known to be BRCA1 and 2 negative,   (1) status post bilateral mastectomies May of 2012 for a right-sided T1c N1 (stage II) grade 3  invasive ductal carcinoma,estrogen and progesterone receptor positive, HER-2 amplified, with an MIB-1-1 of 85%.   (2) status post carboplatin/docetaxol/trastuzumab x6, completed mid December 2012   (3) on trastuzumab every 3 weeks, started August of 2012, interrupted between April  and August of 2013. She will complete one year at the end of this year.   (4) status post radiation therapy, completed 07/12/2011  (5)  On tamoxifen since April 2013   (6) On goserelin given every 3 months, 1st injection on 10/26/11.  PLAN:  Garnett will proceed to treatment today as scheduled for her next q. three-week dose of trastuzumab, and will continue on tamoxifen as before. Her next injection of Zoladex is not due until December.  With regards to the pain in the right chest wall, right axilla, and right upper back, I do feel this is going to be postsurgical in origin. Of course recent CT showed no additional abnormalities. I have given Paulette a short term prescription for hydrocodone/APAP, 5/325 mg, while she awaits a followup appointment with Dr. Carolynne Edouard. She has not tolerated gabapentin well in the past, does not tolerate Flexeril, gets no relief from Skelaxin, and cannot take tramadol secondary to her Celexa.   Corrie is already scheduled for repeat echocardiogram in 2 weeks. She scheduled for followup with Dr. Michell Heinrich in late November. She will have her next dose of trastuzumab here in 3 weeks, and we'll see Dr. [name] for followup in 6 weeks. She knows to call meanwhile any changes or problems.   Alexiz Cothran    03/05/2012

## 2012-03-05 NOTE — Patient Instructions (Signed)
[facility] Discharge Instructions for Patients Receiving Chemotherapy  Today you received the following chemotherapy agents Herceptin To help prevent nausea and vomiting after your treatment, we encourage you to take your nausea medication as prescribed.  If you develop nausea and vomiting that is not controlled by your nausea medication, call the clinic. If it is after clinic hours your family physician or the after hours number for the clinic or go to the Emergency Department.   BELOW ARE SYMPTOMS THAT SHOULD BE REPORTED IMMEDIATELY:  *FEVER GREATER THAN 100.5 F  *CHILLS WITH OR WITHOUT FEVER  NAUSEA AND VOMITING THAT IS NOT CONTROLLED WITH YOUR NAUSEA MEDICATION  *UNUSUAL SHORTNESS OF BREATH  *UNUSUAL BRUISING OR BLEEDING  TENDERNESS IN MOUTH AND THROAT WITH OR WITHOUT PRESENCE OF ULCERS  *URINARY PROBLEMS  *BOWEL PROBLEMS  UNUSUAL RASH Items with * indicate a potential emergency and should be followed up as soon as possible.  One of the nurses will contact you 24 hours after your treatment. Please let the nurse know about any problems that you may have experienced. Feel free to call the clinic you have any questions or concerns. The clinic phone number is [phone].   I have been informed and understand all the instructions given to me. I know to contact the clinic, my physician, or go to the Emergency Department if any problems should occur. I do not have any questions at this time, but understand that I may call the clinic during office hours   should I have any questions or need assistance in obtaining follow up care.    __________________________________________  _____________  __________ Signature of Patient or Authorized Representative            Date                   Time    __________________________________________ Nurse's Signature    

## 2012-03-08 ENCOUNTER — Encounter (INDEPENDENT_AMBULATORY_CARE_PROVIDER_SITE_OTHER): Payer: Self-pay | Admitting: General Surgery

## 2012-03-13 ENCOUNTER — Telehealth: Payer: Self-pay | Admitting: *Deleted

## 2012-03-13 NOTE — Telephone Encounter (Signed)
Per staff message I have sadjusted appts on 12/16.  JMW

## 2012-03-19 ENCOUNTER — Encounter (HOSPITAL_COMMUNITY): Payer: Self-pay

## 2012-03-19 ENCOUNTER — Ambulatory Visit (HOSPITAL_BASED_OUTPATIENT_CLINIC_OR_DEPARTMENT_OTHER)
Admission: RE | Admit: 2012-03-19 | Discharge: 2012-03-19 | Disposition: A | Discharge status: Home / Self Care | Payer: Self-pay | Source: Ambulatory Visit | Attending: Internal Medicine | Admitting: Internal Medicine

## 2012-03-19 ENCOUNTER — Ambulatory Visit (HOSPITAL_COMMUNITY)
Admission: RE | Admit: 2012-03-19 | Discharge: 2012-03-19 | Disposition: A | Discharge status: Home / Self Care | Payer: Self-pay | Source: Ambulatory Visit | Attending: Internal Medicine | Admitting: Internal Medicine

## 2012-03-19 VITALS — BP 110/86 | HR 70 | Wt 131.0 lb

## 2012-03-19 DIAGNOSIS — I427 Cardiomyopathy due to drug and external agent: Secondary | ICD-10-CM

## 2012-03-19 DIAGNOSIS — T451X5A Adverse effect of antineoplastic and immunosuppressive drugs, initial encounter: Secondary | ICD-10-CM

## 2012-03-19 DIAGNOSIS — I429 Cardiomyopathy, unspecified: Secondary | ICD-10-CM

## 2012-03-19 DIAGNOSIS — I079 Rheumatic tricuspid valve disease, unspecified: Secondary | ICD-10-CM | POA: Insufficient documentation

## 2012-03-19 DIAGNOSIS — Z09 Encounter for follow-up examination after completed treatment for conditions other than malignant neoplasm: Secondary | ICD-10-CM

## 2012-03-19 DIAGNOSIS — C50919 Malignant neoplasm of unspecified site of unspecified female breast: Secondary | ICD-10-CM | POA: Insufficient documentation

## 2012-03-19 MED ORDER — CARVEDILOL 6.25 MG PO TABS: 9.3750 mg | ORAL_TABLET | Freq: Two times a day (BID) | ORAL | Status: DC

## 2012-03-19 NOTE — Assessment & Plan Note (Addendum)
Have reviewed today's echo with patient.  EF and lateral s' remain stable.  She should continue herceptin, she will be finished with her therapy at the end of the year.  Continue carvedilol 9.375 mg BID.  Follow up with final echo in January.     Attending: Patient seen and examined with Tonye Becket, NP. We discussed all aspects of the encounter. I agree with the assessment and plan as stated above.  I reviewed echos personally. EF and Doppler parameters look great. Tolerating re-initiation of Herceptin without problem. No HF on exam. Continue Herceptin for remainder of course. Echo in January.

## 2012-03-19 NOTE — Progress Notes (Signed)
Referring Physician: Dr. [name] Primary Care: Dr. Warner Mccreedy Primary Cardiologist:  none  HPI: Mrs. Pikus is a 36 y.o. female with history of seizures (2 episodes with last one ~4 yrs ago), sinus tach and BRCA1 stage II invasive ductal carcinoma, ER/PR/HER-2 positive.  Status post bilateral mastectomy with right axillary lymph node dissection by Dr. Carolynne Edouard on 11/16/10.  She is status post carboplatin/docetaxol/trastuzumab x6, completed mid December 2012.  She is continuing on trastuzumab every 3 weeks for a total of 1 year.  She has also began radiation therapy and will begin antiestrogen therapy once radiation has been completed.  She has been having lymphedema in the right upper extremity and is being seen in the lymphedema clinic.    Echos:  03/03/11: EF 60-65%, lateral S' >16 (top cut off) 06/28/11 EF 50% s' 11.8 08/31/11 EF 45-50% s' 10.2 - herceptin held 10/31/11 EF 55%, 10.5 01/18/12 EF 55% 10.3 03/19/12 EF 50-55% 12.4  Here for routine follow-up.  Feels ok.  She has noted increased fatigue over the last week.  She was prescribed carvedilol 6.25 + 3.125 mg tablets to equal 9.375 mg.  She is now out of her carvedilol 3.125 but has 6.25 mg left although she picked them up on the same day.  She thinks she has been taking increased tablets.  She has also had a death in the family last week which has also caused increased stress. Continues to smoke 1-1.5 cigs/day.  No edema.  Occ dizziness with standing.  Occ palpitations.   Review of Systems: All pertinent positives/negatives as in HPI, otherwise negative.   Past Medical History  Diagnosis Date  . Allergy   . Right breast ca dx'd 10/2010  . Breast cancer 12/03/10  . Status post chemotherapy     docetaxel/carboplatin/trastuzumab  . Anxiety   . Seizures     last seizure 2 years ago, diagnosed 3 years ago  . Hx MRSA infection 03/28/11    Skin    Current Outpatient Prescriptions  Medication Sig Dispense Refill  . ALPRAZolam (XANAX) 0.5  MG tablet Take 0.5 mg by mouth daily. Take one or two pills three times daily as needed for anxiety      . carvedilol (COREG) 3.125 MG tablet Take 3.125 mg by mouth 2 (two) times daily with a meal. Take along with 6.25mg       . carvedilol (COREG) 6.25 MG tablet Take 6.25 mg by mouth 2 (two) times daily with a meal. Take along with 3.75mg       . citalopram (CELEXA) 20 MG tablet Take 40 mg by mouth daily.      Marland Kitchen goserelin (ZOLADEX) 10.8 MG injection Inject 10.8 mg into the skin every 3 (three) months.      . promethazine (PHENERGAN) 25 MG tablet Take 25 mg by mouth. Take 1 tablet before meals and at bed time for nausea      . promethazine (PHENERGAN) 25 MG tablet       . ranitidine (ZANTAC) 150 MG capsule Take 150 mg by mouth 2 (two) times daily as needed. For heartburn      . tamoxifen (NOLVADEX) 20 MG tablet Take 20 mg by mouth daily.      . cetirizine-pseudoephedrine (ZYRTEC-D) 5-120 MG per tablet Take 1 tablet by mouth once.      . diazepam (VALIUM) 5 MG tablet Take 1 tablet (5 mg total) by mouth 2 (two) times daily as needed (muscle spasm).  10 tablet  0  . HYDROcodone-acetaminophen (VICODIN)  5-325 mg TABS 1-2 tabs po q 8 hours prn  30 tablet  0    Allergies  Allergen Reactions  . Phenytoin Nausea And Vomiting  . Thorazine (Chlorpromazine Hcl) Hives  . Ciprofloxacin Nausea And Vomiting  . Vancomycin Rash    Possible rash reported by MD    PHYSICAL EXAM: Filed Vitals:   03/19/12 1215  BP: 110/86  Pulse: 70  Weight: 131 lb (59.421 kg)  SpO2: 97%   General:  Thin, No respiratory difficulty HEENT: normal Neck: supple. no JVD. Carotids 2+ bilat; no bruits. No lymphadenopathy or thryomegaly appreciated. Cor: PMI nondisplaced. regular rhythm. No rubs, gallops or murmurs. Lungs: clear Abdomen: soft, nontender, nondistended. No hepatosplenomegaly. No bruits or masses. Good bowel sounds. Extremities: no cyanosis, clubbing, rash, edema.   Neuro: alert & oriented x 3, cranial nerves  grossly intact. moves all 4 extremities w/o difficulty. Affect pleasant.   ASSESSMENT & PLAN:

## 2012-03-19 NOTE — Progress Notes (Signed)
  Echocardiogram 2D Echocardiogram has been performed.  Crystal [name] 03/19/2012, 12:26 PM

## 2012-03-19 NOTE — Patient Instructions (Addendum)
Pick up carvedilol 9.375 mg (1.5 tabs) twice daily  Follow up Jan with echo.

## 2012-03-22 ENCOUNTER — Ambulatory Visit: Payer: Self-pay | Admitting: Radiation Oncology

## 2012-03-22 ENCOUNTER — Encounter (INDEPENDENT_AMBULATORY_CARE_PROVIDER_SITE_OTHER): Payer: Self-pay | Admitting: General Surgery

## 2012-03-26 ENCOUNTER — Other Ambulatory Visit: Payer: Self-pay

## 2012-03-26 ENCOUNTER — Telehealth: Payer: Self-pay | Admitting: *Deleted

## 2012-03-26 ENCOUNTER — Ambulatory Visit: Payer: Self-pay

## 2012-03-26 NOTE — Telephone Encounter (Signed)
Per patient request I have rescheduled her from today to last Monday. Desk RN notified.  JMW

## 2012-04-02 ENCOUNTER — Ambulatory Visit (HOSPITAL_BASED_OUTPATIENT_CLINIC_OR_DEPARTMENT_OTHER): Payer: Self-pay

## 2012-04-02 ENCOUNTER — Other Ambulatory Visit (HOSPITAL_BASED_OUTPATIENT_CLINIC_OR_DEPARTMENT_OTHER): Payer: Self-pay | Admitting: Lab

## 2012-04-02 VITALS — BP 124/87 | HR 104 | Temp 97.6°F | Resp 20

## 2012-04-02 DIAGNOSIS — C50919 Malignant neoplasm of unspecified site of unspecified female breast: Secondary | ICD-10-CM

## 2012-04-02 DIAGNOSIS — C773 Secondary and unspecified malignant neoplasm of axilla and upper limb lymph nodes: Secondary | ICD-10-CM

## 2012-04-02 DIAGNOSIS — Z5112 Encounter for antineoplastic immunotherapy: Secondary | ICD-10-CM

## 2012-04-02 LAB — COMPREHENSIVE METABOLIC PANEL (CC13)
AST: 22 U/L (ref 5–34)
Albumin: 3.8 g/dL (ref 3.5–5.0)
BUN: 21 mg/dL (ref 7.0–26.0)
Calcium: 9.1 mg/dL (ref 8.4–10.4)
Chloride: 105 mEq/L (ref 98–107)
Potassium: 3.9 mEq/L (ref 3.5–5.1)
Total Protein: 6.4 g/dL (ref 6.4–8.3)

## 2012-04-02 LAB — CBC WITH DIFFERENTIAL/PLATELET
Basophils Absolute: 0 10*3/uL (ref 0.0–0.1)
EOS%: 1.7 % (ref 0.0–7.0)
Eosinophils Absolute: 0.1 10*3/uL (ref 0.0–0.5)
HGB: 13.5 g/dL (ref 11.6–15.9)
MCH: 33.3 pg (ref 25.1–34.0)
NEUT#: 4.8 10*3/uL (ref 1.5–6.5)
RDW: 13 % (ref 11.2–14.5)
lymph#: 1.4 10*3/uL (ref 0.9–3.3)

## 2012-04-02 MED ORDER — OXYCODONE-ACETAMINOPHEN 5-325 MG PO TABS
1.0000 | ORAL_TABLET | Freq: Once | ORAL | Status: AC
  Administered 2012-04-02: 1 via ORAL

## 2012-04-02 MED ORDER — SODIUM CHLORIDE 0.9 % IV SOLN
6.0000 mg/kg | Freq: Once | INTRAVENOUS | Status: AC
  Administered 2012-04-02: 336 mg via INTRAVENOUS
  Filled 2012-04-02: qty 16

## 2012-04-02 MED ORDER — ONDANSETRON 8 MG/50ML IVPB (CHCC)
8.0000 mg | Freq: Once | INTRAVENOUS | Status: AC
  Administered 2012-04-02: 8 mg via INTRAVENOUS

## 2012-04-02 MED ORDER — SODIUM CHLORIDE 0.9 % IV SOLN
Freq: Once | INTRAVENOUS | Status: AC
  Administered 2012-04-02: 16:00:00 via INTRAVENOUS

## 2012-04-02 MED ORDER — SODIUM CHLORIDE 0.9 % IJ SOLN
10.0000 mL | INTRAMUSCULAR | Status: DC | PRN
  Administered 2012-04-02: 10 mL
  Filled 2012-04-02: qty 10

## 2012-04-02 MED ORDER — HEPARIN SOD (PORK) LOCK FLUSH 100 UNIT/ML IV SOLN
500.0000 [IU] | Freq: Once | INTRAVENOUS | Status: AC | PRN
  Administered 2012-04-02: 500 [IU]
  Filled 2012-04-02: qty 5

## 2012-04-02 NOTE — Patient Instructions (Addendum)
[facility] Discharge Instructions for Patients Receiving Chemotherapy  Today you received the following chemotherapy agents; Herceptin  To help prevent nausea and vomiting after your treatment, we encourage you to take your nausea medication as directed.   If you develop nausea and vomiting that is not controlled by your nausea medication, call the clinic. If it is after clinic hours your family physician or the after hours number for the clinic or go to the Emergency Department.   BELOW ARE SYMPTOMS THAT SHOULD BE REPORTED IMMEDIATELY:  *FEVER GREATER THAN 100.5 F  *CHILLS WITH OR WITHOUT FEVER  NAUSEA AND VOMITING THAT IS NOT CONTROLLED WITH YOUR NAUSEA MEDICATION  *UNUSUAL SHORTNESS OF BREATH  *UNUSUAL BRUISING OR BLEEDING  TENDERNESS IN MOUTH AND THROAT WITH OR WITHOUT PRESENCE OF ULCERS  *URINARY PROBLEMS  *BOWEL PROBLEMS  UNUSUAL RASH Items with * indicate a potential emergency and should be followed up as soon as possible.  Feel free to call the clinic you have any questions or concerns. The clinic phone number is [phone].   I have been informed and understand all the instructions given to me. I know to contact the clinic, my physician, or go to the Emergency Department if any problems should occur. I do not have any questions at this time, but understand that I may call the clinic during office hours   should I have any questions or need assistance in obtaining follow up care.    __________________________________________  _____________  __________ Signature of Patient or Authorized Representative            Date                   Time    __________________________________________ Nurse's Signature    

## 2012-04-06 ENCOUNTER — Ambulatory Visit: Payer: Self-pay | Admitting: Family Medicine

## 2012-04-06 VITALS — BP 123/89 | HR 99 | Temp 97.6°F | Resp 18 | Ht 66.5 in | Wt 131.0 lb

## 2012-04-06 DIAGNOSIS — R195 Other fecal abnormalities: Secondary | ICD-10-CM

## 2012-04-06 DIAGNOSIS — R1013 Epigastric pain: Secondary | ICD-10-CM

## 2012-04-06 DIAGNOSIS — F329 Major depressive disorder, single episode, unspecified: Secondary | ICD-10-CM

## 2012-04-06 DIAGNOSIS — F419 Anxiety disorder, unspecified: Secondary | ICD-10-CM

## 2012-04-06 DIAGNOSIS — F411 Generalized anxiety disorder: Secondary | ICD-10-CM

## 2012-04-06 DIAGNOSIS — K219 Gastro-esophageal reflux disease without esophagitis: Secondary | ICD-10-CM

## 2012-04-06 MED ORDER — CITALOPRAM HYDROBROMIDE 20 MG PO TABS: 40.0000 mg | ORAL_TABLET | Freq: Every day | ORAL | Status: DC

## 2012-04-06 MED ORDER — SUCRALFATE 1 G PO TABS: 1.0000 g | ORAL_TABLET | Freq: Four times a day (QID) | ORAL | Status: DC

## 2012-04-06 MED ORDER — ALPRAZOLAM 0.5 MG PO TABS: 0.5000 mg | ORAL_TABLET | Freq: Every day | ORAL | Status: DC

## 2012-04-06 NOTE — Progress Notes (Signed)
[facility] [address], [place] [address] [phone]  Date:  04/06/2012   Name:  Crystal [name]   DOB:  [birth year]   MRN:  [id number]  PCP:  Crystal Amsterdam, MD    Chief Complaint: No chief complaint on file.   History of Present Illness:  Crystal [name] is a 36 y.o. very pleasant female patient who presents with the following:  She needs to "get back on track with my meds."  We increased her celexa dose a couple of months ago- this did seem to help her but she ran out about 3 weeks ago.  She would like to get back on this today.  She has noted that her mood is not as good since she came off her celexa.   She continues to take xanax 0.5 mg, two pills TID- She also needs to refill her xanax today.  She is also concerned about her stomach.  She has been eating a more rich diet than usual due to the holidays.  For the last few days she has noted some epigastric burning, some nausea and occasional vomiting.   She has been taking zantac and gas- x which usually would help but has not been as helpful this time.  No diarrhea.   She does tend to take NSAIDs fairly regularly due to various pains.  Her stool appeared dark yesterday- she has not had a stool yet today.    She has used pnenergan in the past but this sometimes makes her sleepy.    She uses zoladex injections every 3 months.  She has been told that this medication put her into menopause and she no longer has periods   Patient Active Problem List  Diagnosis  . NAUSEA AND VOMITING  . ABDOMINAL PAIN, RIGHT LOWER QUADRANT  . Breast cancer  . Cellulitis and abscess  . Seizure  . Anemia associated with chemotherapy  . Hx MRSA infection  . Tachycardia - pulse  . Generalized convulsive seizure  . Seizure disorder  . Migraine  . Anxiety disorder  . Depression  . Chemotherapy induced cardiomyopathy  . Syncope  . Diarrhea  . Cough  . Palpitations    Past Medical History  Diagnosis Date  .  Allergy   . Right breast ca dx'd 10/2010  . Breast cancer 12/03/10  . Status post chemotherapy     docetaxel/carboplatin/trastuzumab  . Anxiety   . Seizures     last seizure 2 years ago, diagnosed 3 years ago  . Hx MRSA infection 03/28/11    Skin    Past Surgical History  Procedure Date  . Wisdom tooth extraction   . Breast surgery 11/16/2010    bilateral mastectomy+Ralnd,T1cN1a, Her2+,ERPR+  . Biopsy breast 10/14/2010    right needle core biopsy  . Portacath placement 11/16/2010    placement of left subclavian port  . I&d extremity 09/26/2011    Procedure: IRRIGATION AND DEBRIDEMENT EXTREMITY;  Surgeon: [name] Ribas, MD;  Location: [facility];  Service: Orthopedics;  Laterality: Right;  I&D right hand cat bite wound    History  Substance Use Topics  . Smoking status: Current Some Day Smoker -- 2.0 packs/day for 20 years    Types: Cigarettes  . Smokeless tobacco: Never Used  . Alcohol Use: Yes     Comment: occa    Family History  Problem Relation Age of Onset  . Cancer Maternal Grandfather     Allergies  Allergen Reactions  .  Phenytoin Nausea And Vomiting  . Thorazine (Chlorpromazine Hcl) Hives  . Ciprofloxacin Nausea And Vomiting  . Vancomycin Rash    Possible rash reported by MD    Medication list has been reviewed and updated.  Current Outpatient Prescriptions on File Prior to Visit  Medication Sig Dispense Refill  . ALPRAZolam (XANAX) 0.5 MG tablet Take 0.5 mg by mouth daily. Take one or two pills three times daily as needed for anxiety      . carvedilol (COREG) 6.25 MG tablet Take 1.5 tablets (9.375 mg total) by mouth 2 (two) times daily with a meal. Take along with 3.75mg   90 tablet  3  . citalopram (CELEXA) 20 MG tablet Take 40 mg by mouth daily.      Marland Kitchen goserelin (ZOLADEX) 10.8 MG injection Inject 10.8 mg into the skin every 3 (three) months.      . promethazine (PHENERGAN) 25 MG tablet Take 25 mg by mouth. Take 1 tablet before meals and at bed time for nausea       . ranitidine (ZANTAC) 150 MG capsule Take 150 mg by mouth 2 (two) times daily as needed. For heartburn      . tamoxifen (NOLVADEX) 20 MG tablet Take 20 mg by mouth daily.      . [DISCONTINUED] promethazine (PHENERGAN) 25 MG tablet         Review of Systems:  As per HPI- otherwise negative.   Physical Examination: Filed Vitals:   04/06/12 1059  BP: 123/89  Pulse: 99  Temp: 97.6 F (36.4 C)  Resp: 18   Filed Vitals:   04/06/12 1059  Height: 5' 6.5" (1.689 m)  Weight: 131 lb (59.421 kg)   Body mass index is 20.83 kg/(m^2). Ideal Body Weight: Weight in (lb) to have BMI = 25: 156.9   GEN: WDWN, NAD, Non-toxic, A & O x 3 HEENT: Atraumatic, Normocephalic. Neck supple. No masses, No LAD. Ears and Nose: No external deformity. CV: RRR, No M/G/R. No JVD. No thrill. No extra heart sounds. PULM: CTA B, no wheezes, crackles, rhonchi. No retractions. No resp. distress. No accessory muscle use. ABD: S, ND, +BS. No rebound. No HSM. She has slight epigastric tenderness  EXTR: No c/c/e NEURO Normal gait.  PSYCH: Normally interactive. Conversant. Not depressed or anxious appearing.  Calm demeanor.  Rectal exam: no gross bleeding, no melena noted.  Sample for hemasure taken  Results for orders placed in visit on 04/06/12  IFOBT (OCCULT BLOOD)      Component Value Range   IFOBT Negative     Somaly declined BW today- she had a CBC and CMP performed on Monday and her symptoms were present then.  She would like to avoid any additional expense  Assessment and Plan: 1. Epigastric pain    2. Depression  citalopram (CELEXA) 20 MG tablet  3. Anxiety  ALPRAZolam (XANAX) 0.5 MG tablet  4. GERD (gastroesophageal reflux disease)  sucralfate (CARAFATE) 1 G tablet  5. Dark stools  IFOBT POC (occult bld, rslt in office)   Crystal [name] needs to restart her celexa- will taper her back onto her dosage of 40mg / day over a week. Also refilled her xanax.  She takes 0.5 to 1 mg TID  Suspect that her  abdominal pain is due to gastritis/ GERD.  She will avoid NSAIDs for a while and eat a bland diet.  Will add carafate to her regimen, she will continue zantac.  See patient instructions for more details.    Meds ordered this  encounter  Medications  . citalopram (CELEXA) 20 MG tablet    Sig: Take 2 tablets (40 mg total) by mouth daily. Take one pill a day, then may increase to 2 after one week    Dispense:  60 tablet    Refill:  5  . ALPRAZolam (XANAX) 0.5 MG tablet    Sig: Take 1 tablet (0.5 mg total) by mouth daily. Take one or two pills three times daily as needed for anxiety    Dispense:  180 tablet    Refill:  2  . sucralfate (CARAFATE) 1 G tablet    Sig: Take 1 tablet (1 g total) by mouth 4 (four) times daily.    Dispense:  40 tablet    Refill:  0     Quinnley Colasurdo, MD

## 2012-04-06 NOTE — Patient Instructions (Signed)
Try a bland diet, and avoid acidic foods such as tomatoes, coffee, mints, and spicy foods. Use the carafate as needed, and continue your antacid.  Let me know if you are not better in the next few days.  If you get worse please lot [place] know- right away if you have severe or persistent pain

## 2012-04-09 ENCOUNTER — Encounter (INDEPENDENT_AMBULATORY_CARE_PROVIDER_SITE_OTHER): Payer: Self-pay | Admitting: General Surgery

## 2012-04-13 ENCOUNTER — Other Ambulatory Visit: Payer: Self-pay | Admitting: *Deleted

## 2012-04-16 ENCOUNTER — Ambulatory Visit: Payer: Self-pay

## 2012-04-16 ENCOUNTER — Other Ambulatory Visit: Payer: Self-pay | Admitting: Lab

## 2012-04-16 ENCOUNTER — Ambulatory Visit: Payer: Self-pay | Admitting: Oncology

## 2012-04-16 ENCOUNTER — Telehealth: Payer: Self-pay | Admitting: *Deleted

## 2012-04-16 NOTE — Telephone Encounter (Signed)
Per staff message and POF I have scheduled appts.  JMW  

## 2012-04-16 NOTE — Telephone Encounter (Signed)
called patient and requested to reschedule her appointment to 04-19-2012 at 11:00am

## 2012-04-19 ENCOUNTER — Ambulatory Visit (HOSPITAL_BASED_OUTPATIENT_CLINIC_OR_DEPARTMENT_OTHER): Payer: Self-pay | Admitting: Oncology

## 2012-04-19 ENCOUNTER — Telehealth: Payer: Self-pay | Admitting: *Deleted

## 2012-04-19 ENCOUNTER — Ambulatory Visit: Payer: Self-pay

## 2012-04-19 ENCOUNTER — Other Ambulatory Visit (HOSPITAL_BASED_OUTPATIENT_CLINIC_OR_DEPARTMENT_OTHER): Payer: Self-pay | Admitting: Lab

## 2012-04-19 VITALS — BP 131/93 | HR 103 | Temp 98.2°F | Resp 20 | Ht 66.5 in | Wt 134.4 lb

## 2012-04-19 DIAGNOSIS — C50919 Malignant neoplasm of unspecified site of unspecified female breast: Secondary | ICD-10-CM

## 2012-04-19 DIAGNOSIS — C50419 Malignant neoplasm of upper-outer quadrant of unspecified female breast: Secondary | ICD-10-CM

## 2012-04-19 DIAGNOSIS — Z170 Estrogen receptor positive status [ER+]: Secondary | ICD-10-CM

## 2012-04-19 DIAGNOSIS — G9389 Other specified disorders of brain: Secondary | ICD-10-CM

## 2012-04-19 LAB — CBC WITH DIFFERENTIAL/PLATELET
BASO%: 0.4 % (ref 0.0–2.0)
EOS%: 1.1 % (ref 0.0–7.0)
Eosinophils Absolute: 0.1 10*3/uL (ref 0.0–0.5)
MCH: 33.2 pg (ref 25.1–34.0)
MCHC: 35.1 g/dL (ref 31.5–36.0)
MCV: 94.5 fL (ref 79.5–101.0)
MONO%: 3.9 % (ref 0.0–14.0)
NEUT#: 5.5 10*3/uL (ref 1.5–6.5)
RBC: 4.16 10*6/uL (ref 3.70–5.45)
RDW: 12.9 % (ref 11.2–14.5)
nRBC: 0 % (ref 0–0)

## 2012-04-19 MED ORDER — TAMOXIFEN CITRATE 20 MG PO TABS: 20.0000 mg | ORAL_TABLET | Freq: Every day | ORAL | Status: DC

## 2012-04-19 NOTE — Telephone Encounter (Signed)
Per staff message and POF I have scheduled appts.  JMW  

## 2012-04-19 NOTE — Telephone Encounter (Signed)
Per staff message and POF I have scheduled appt.  JMW  

## 2012-04-19 NOTE — Telephone Encounter (Signed)
Per orders cancelled treatment for 04-19-2012  Add on treatment for 04-27-2012  [facility] email to set up treatment

## 2012-04-19 NOTE — Progress Notes (Signed)
ID: Crystal [name]   DOB: 10-20-1975  MR#: [id number]  JXB#:[id number]  HISTORY OF PRESENT ILLNESS: The patient is a 36 year old [place] woman who noted a mass in her right breast and brought it to her primary care physician's attention June of 2012. She was set up for mammography at [facility], where an area of calcifications highly suspicious for carcinoma was biopsied. This was in the upper outer quadrant and it measured 1.7 cm mammographically and 1.8 cm by ultrasonography. The pathology report ([id number]) showed a high-grade invasive ductal carcinoma estrogen receptor positive at 64% progesterone receptor positive at 18%, and HER-2 amplified with a ratio by CISH of 5.24. MIB-1-1 was 65%. A second mass biopsied at the same time was also triple positive.  Given her multiple masses in the right breast, mastectomy was recommended. The patient actually opted for bilateral mastectomies and this was performed together with a right axillary lymph node dissection under Dr. Felicity Pellegrini on 11/16/2010. The final pathology [id number]) showed, on the left, no evidence of cancer. On the right the patient had a high-grade invasive ductal carcinoma measuring 1.9 cm, with negative margins, grade 3, involving 2 of 16 lymph nodes (stage IIB).  Her subsequent history is as detailed below.  INTERVAL HISTORY:  Crystal [name] returns today for followup of her breast carcinoma. She is doing well with the Herceptin, which she will complete next week. She's also tolerating the goserelin, which she appreciates particularly because it totally takes care of her perimenstrual symptoms, which were severe. Of course it does cause vaginal dryness and there taking care of that would lubricants. She has some heartburn issues, hot flashes, occasional headaches, and of course issues with depression, although that seems to be well under control at present.   REVIEW OF SYSTEMS: A detailed review of systems today was otherwise noncontributory    PAST MEDICAL HISTORY: Past Medical History  Diagnosis Date  . Allergy   . Right breast ca dx'd 10/2010  . Breast cancer 12/03/10  . Status post chemotherapy     docetaxel/carboplatin/trastuzumab  . Anxiety   . Seizures     last seizure 2 years ago, diagnosed 3 years ago  . Hx MRSA infection 03/28/11    Skin  1. History of seizures. The patient has been thoroughly evaluated both here by Dr. [name] and at [facility] for this. Among many studies, she had an MRI of the brain on August 28th. This showed an incidental solitary small lesion in the left frontoparietal white matter. It was not felt to be suggestive of multiple sclerosis. A noncontrasted CT in August 2010 was negative. The patient has been off all seizure medications now for 2 months. There has been no evidence of seizure recurrence. 2. Complex psychology history (please refer to Dr. [name] note in the E-chart from August 29, 2009) with evidence of anxiety disorder, depression and psychosis. The patient is currently on no medications for this and denies any of these symptoms at present. 3. History of migraines. 4. Remote history of multidrug abuse. 5. Status post wisdom teeth extraction.  History of MRSA skin infection   PAST SURGICAL HISTORY: Past Surgical History  Procedure Date  . Wisdom tooth extraction   . Breast surgery 11/16/2010    bilateral mastectomy+Ralnd,T1cN1a, Her2+,ERPR+  . Biopsy breast 10/14/2010    right needle core biopsy  . Portacath placement 11/16/2010    placement of left subclavian port  . I&d extremity 09/26/2011    Procedure: IRRIGATION AND DEBRIDEMENT EXTREMITY;  Surgeon: Caryn Bee  Georges Lynch, MD;  Location: [facility];  Service: Orthopedics;  Laterality: Right;  I&D right hand cat bite wound    FAMILY HISTORY Family History  Problem Relation Age of Onset  . Cancer Maternal Grandfather   The patient's mother is living, in her mid 16s, with no history of breast or ovarian cancer. The patient does not know  her biological father. The patient has an older brother with bipolar disease, and according to the patient, borderline schizophrenia.   GYNECOLOGIC HISTORY: Menarche at age 6. She is G1, P1 with first pregnancy to term at age 46. The patient had significant pelvic pain recently and was evaluated with transvaginal and pelvic ultrasound on July 17th by Dr. Natale Milch. This showed normal uterine myometrium and endometrium with bilateral functional ovarian cysts. There were no worrisome features. She is currently on every three-month goserelin.  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 Her husband of 4 years, [name], works as a Administrator. In addition to them at home is the patient's son, [name], 81 years old attends [employer] in the tenth grade. [name], according to the patient, has significant nausea problems. Currently the girl friend of a friend of [name] and Carl's is living with them as well. Apparently the friend died from a gunshot wound to the head, but it is not clear what the circumstances were. The patient attends a local [address]. [name] is pastor.    ADVANCED DIRECTIVES: Not in place  HEALTH MAINTENANCE: History  Substance Use Topics  . Smoking status: Current Some Day Smoker -- 2.0 packs/day for 20 years    Types: Cigarettes  . Smokeless tobacco: Never Used  . Alcohol Use: Yes     Comment: occa     Colonoscopy:  PAP:  Bone density:  Lipid panel:  Allergies  Allergen Reactions  . Phenytoin Nausea And Vomiting  . Thorazine (Chlorpromazine Hcl) Hives  . Ciprofloxacin Nausea And Vomiting  . Vancomycin Rash    Possible rash reported by MD    Current Outpatient Prescriptions  Medication Sig Dispense Refill  . ALPRAZolam (XANAX) 0.5 MG tablet Take 1 tablet (0.5 mg total) by mouth daily. Take one or two pills three times daily as needed for anxiety  180 tablet  2  . carvedilol (COREG)  6.25 MG tablet Take 1.5 tablets (9.375 mg total) by mouth 2 (two) times daily with a meal. Take along with 3.75mg   90 tablet  3  . citalopram (CELEXA) 20 MG tablet Take 2 tablets (40 mg total) by mouth daily. Take one pill a day, then may increase to 2 after one week  60 tablet  5  . goserelin (ZOLADEX) 10.8 MG injection Inject 10.8 mg into the skin every 3 (three) months.      . promethazine (PHENERGAN) 25 MG tablet Take 25 mg by mouth. Take 1 tablet before meals and at bed time for nausea      . ranitidine (ZANTAC) 150 MG capsule Take 150 mg by mouth 2 (two) times daily as needed. For heartburn      . sucralfate (CARAFATE) 1 G tablet Take 1 tablet (1 g total) by mouth 4 (four) times daily.  40 tablet  0  . tamoxifen (NOLVADEX) 20 MG tablet Take 20 mg by mouth daily.       OBJECTIVE: Young white female in no acute distress Filed Vitals:   04/19/12 1145  BP: 131/93  Pulse: 103  Temp: 98.2 F (36.8 C)  Resp: 20  ECOG: 1 Filed Weights   04/19/12 1145  Weight: 134 lb 6.4 oz (60.963 kg)   Physical Exam: HEENT:  Sclerae anicteric.  Oropharynx clear.  Nodes:  No cervical or supraclavicular lymphadenopathy  Breast Exam: Status post bilateral mastectomies. No evidence of local recurrence. Both axillae are clear. Lungs:  Clear to auscultation bilaterally.   Heart:  Regular rate and rhythm.   Abdomen:  Soft, nontender.  Positive bowel sounds.  Musculoskeletal:  No focal spinal tenderness Extremities:  No peripheral edema  Neuro:  Nonfocal, alert and oriented x3, positive affect   LAB RESULTS: Lab Results  Component Value Date   WBC 7.1 04/19/2012   NEUTROABS 5.5 04/19/2012   HGB 13.8 04/19/2012   HCT 39.3 04/19/2012   MCV 94.5 04/19/2012   PLT 152 04/19/2012      Chemistry      Component Value Date/Time   NA 139 04/02/2012 1454   NA 138 02/21/2012 1410   K 3.9 04/02/2012 1454   K 3.8 02/21/2012 1410   CL 105 04/02/2012 1454   CL 102 02/21/2012 1410   CO2 23 04/02/2012 1454    CO2 26 02/21/2012 1410   BUN 21.0 04/02/2012 1454   BUN 11 02/21/2012 1410   CREATININE 1.3* 04/02/2012 1454   CREATININE 0.79 02/21/2012 1410      Component Value Date/Time   CALCIUM 9.1 04/02/2012 1454   CALCIUM 9.4 02/21/2012 1410   ALKPHOS 83 04/02/2012 1454   ALKPHOS 84 12/12/2011 1451   AST 22 04/02/2012 1454   AST 18 12/12/2011 1451   ALT 14 04/02/2012 1454   ALT 11 12/12/2011 1451   BILITOT 0.39 04/02/2012 1454   BILITOT 0.4 12/12/2011 1451       Lab Results  Component Value Date   LABCA2 16 08/24/2011     STUDIES: No results found.   ASSESSMENT: 36 year old [place] woman, known to be BRCA1 and 2 negative,   (1) status post bilateral mastectomies May of 2012 for a right-sided T1c N1 (stage II) grade 3 invasive ductal carcinoma,estrogen and progesterone receptor positive, HER-2 amplified, with an MIB-1-1 of 85%.   (2) status post carboplatin/docetaxol/trastuzumab x6, completed mid December 2012   (3) on trastuzumab every 3 weeks, started August of 2012, interrupted between April and August of 2013, completed 04/27/2012. Most recent echo 03/19/2012  (4) status post radiation therapy, completed 07/12/2011  (5)  On tamoxifen since April 2013   (6) On goserelin given every 3 months, 1st injection on 10/26/11, most recent dose 04/27/2012.  PLAN:  Is doing terrific from a breast cancer point of view. [name] is concerned that she is not taking her caloric as prescribed. That was discussed today. She is thinking of joining of the wide to get some exercises. She would like to keep her port at least for a while so she will take him back early February for a visit and port flush. We will check labs at the same time. She will see me 6 weeks after that, with her next goserelin shot. From that point we will start seeing her on a Q3 month basis. She knows to call for any problems that may develop before that.   Edward Trevino C    04/19/2012

## 2012-04-20 ENCOUNTER — Telehealth: Payer: Self-pay | Admitting: *Deleted

## 2012-04-20 NOTE — Telephone Encounter (Signed)
Patient called regarding her appt for 12/27. I gave her the date and time.  JMW

## 2012-04-27 ENCOUNTER — Ambulatory Visit (HOSPITAL_BASED_OUTPATIENT_CLINIC_OR_DEPARTMENT_OTHER): Payer: Self-pay

## 2012-04-27 VITALS — BP 126/92 | HR 70 | Temp 97.9°F | Resp 18

## 2012-04-27 DIAGNOSIS — Z5111 Encounter for antineoplastic chemotherapy: Secondary | ICD-10-CM

## 2012-04-27 DIAGNOSIS — Z5112 Encounter for antineoplastic immunotherapy: Secondary | ICD-10-CM

## 2012-04-27 DIAGNOSIS — C50919 Malignant neoplasm of unspecified site of unspecified female breast: Secondary | ICD-10-CM

## 2012-04-27 MED ORDER — GOSERELIN ACETATE 10.8 MG ~~LOC~~ IMPL
10.8000 mg | DRUG_IMPLANT | SUBCUTANEOUS | Status: DC
  Administered 2012-04-27: 10.8 mg via SUBCUTANEOUS
  Filled 2012-04-27: qty 10.8

## 2012-04-27 MED ORDER — SODIUM CHLORIDE 0.9 % IV SOLN
Freq: Once | INTRAVENOUS | Status: AC
  Administered 2012-04-27: 11:00:00 via INTRAVENOUS

## 2012-04-27 MED ORDER — OXYCODONE-ACETAMINOPHEN 5-325 MG PO TABS
1.0000 | ORAL_TABLET | Freq: Once | ORAL | Status: AC
  Administered 2012-04-27: 1 via ORAL

## 2012-04-27 MED ORDER — SODIUM CHLORIDE 0.9 % IJ SOLN
10.0000 mL | INTRAMUSCULAR | Status: DC | PRN
  Administered 2012-04-27: 10 mL
  Filled 2012-04-27: qty 10

## 2012-04-27 MED ORDER — TRASTUZUMAB CHEMO INJECTION 440 MG
6.0000 mg/kg | Freq: Once | INTRAVENOUS | Status: AC
  Administered 2012-04-27: 336 mg via INTRAVENOUS
  Filled 2012-04-27: qty 16

## 2012-04-27 MED ORDER — HEPARIN SOD (PORK) LOCK FLUSH 100 UNIT/ML IV SOLN
500.0000 [IU] | Freq: Once | INTRAVENOUS | Status: AC | PRN
  Administered 2012-04-27: 500 [IU]
  Filled 2012-04-27: qty 5

## 2012-04-27 MED ORDER — OXYCODONE-ACETAMINOPHEN 5-325 MG PO TABS: 1.0000 | ORAL_TABLET | ORAL | Status: DC | PRN

## 2012-04-27 MED ORDER — ONDANSETRON 8 MG/50ML IVPB (CHCC)
8.0000 mg | Freq: Once | INTRAVENOUS | Status: AC
  Administered 2012-04-27: 8 mg via INTRAVENOUS

## 2012-04-27 NOTE — Patient Instructions (Addendum)
[facility] Discharge Instructions for Patients Receiving Chemotherapy  Today you received the following chemotherapy agents Herceptin and Xoladex.  To help prevent nausea and vomiting after your treatment, we encourage you to take your nausea medication.   If you develop nausea and vomiting that is not controlled by your nausea medication, call the clinic. If it is after clinic hours your family physician or the after hours number for the clinic or go to the Emergency Department.   BELOW ARE SYMPTOMS THAT SHOULD BE REPORTED IMMEDIATELY:  *FEVER GREATER THAN 100.5 F  *CHILLS WITH OR WITHOUT FEVER  NAUSEA AND VOMITING THAT IS NOT CONTROLLED WITH YOUR NAUSEA MEDICATION  *UNUSUAL SHORTNESS OF BREATH  *UNUSUAL BRUISING OR BLEEDING  TENDERNESS IN MOUTH AND THROAT WITH OR WITHOUT PRESENCE OF ULCERS  *URINARY PROBLEMS  *BOWEL PROBLEMS  UNUSUAL RASH Items with * indicate a potential emergency and should be followed up as soon as possible.  One of the nurses will contact you 24 hours after your treatment. Please let the nurse know about any problems that you may have experienced. Feel free to call the clinic you have any questions or concerns. The clinic phone number is [phone].   I have been informed and understand all the instructions given to me. I know to contact the clinic, my physician, or go to the Emergency Department if any problems should occur. I do not have any questions at this time, but understand that I may call the clinic during office hours   should I have any questions or need assistance in obtaining follow up care.    __________________________________________  _____________  __________ Signature of Patient or Authorized Representative            Date                   Time    __________________________________________ Nurse's Signature

## 2012-04-27 NOTE — Progress Notes (Signed)
Patient complaining of pain in left lower abd area near injection site. No redness or swelling noted. Offered ice pack; Patient requesting additional pain med. Order received to repeat percocet 1 tablet now. Instructed patient to call for any changes or concerns.

## 2012-05-01 ENCOUNTER — Telehealth: Payer: Self-pay

## 2012-05-01 NOTE — Telephone Encounter (Signed)
Crystal [name] would like to know if dr copland can increase her celexa please call patient at [phone]

## 2012-05-02 DIAGNOSIS — I82629 Acute embolism and thrombosis of deep veins of unspecified upper extremity: Secondary | ICD-10-CM

## 2012-05-02 HISTORY — DX: Acute embolism and thrombosis of deep veins of unspecified upper extremity: I82.629

## 2012-05-02 NOTE — Telephone Encounter (Signed)
Called her back and she did answer.  She states that she is acting hyper and annoying her husband.  She states that she is sleeping.  However, she is acting on impulses and actually shaved her head.  She is happy about the shaved head but knows it was impulsive. She is not worried about hurting herself or anyone else She reports that she was actually diagnosed with bipolar disorder in the past per Dr. Dub Mikes.  She states she is no longer in contact with a psychiatrist and does not want to see one.  Let her know that mania is outside of my scope of practice, and we need to have her see a mental health specialist.  She will call her counselor at the cancer center tomorrow and see if they can help her find a psychiatrist.  Otherwise I encouraged her to go to [facility] or come into [facility] in the next day or so for evaluation.  She will not increase her celexa

## 2012-05-02 NOTE — Telephone Encounter (Signed)
Dr Patsy Lager, I spoke w/pt and she verified that she did increase her dose of Celexa to 40 mg as you had planned at her last OV and that is what she is taking daily now. She reports that she has not had any really "down" periods since increasing to 40 mg, but she has been very manic recently (she describes it as feeling "like she has had 10 cups of coffee w/out drinking any"), and is very hyper all the time. Her husband and others comment a lot about it and pt stated she is embarrassed to go places. She is also worried about cycling to a down period and how bad it might be since her manic episodes have been so bad. I D/W pt that it is normally not recommended to take more than 40 mg/day, but that I will ask Dr Patsy Lager and also ask her if there is anything else that she would suggest to help pt calm down a little. Pt does not want anything that will make her sleepy.

## 2012-05-02 NOTE — Telephone Encounter (Signed)
Called back again- no answer.

## 2012-05-02 NOTE — Telephone Encounter (Signed)
Called and LMOM- I do not think that increasing her celexa is the answer here.  If she is getting worse or having signs of mania please go to [facility] [address].  Otherwise please call me back - I will try her again later

## 2012-05-07 ENCOUNTER — Ambulatory Visit: Payer: Self-pay

## 2012-05-07 ENCOUNTER — Other Ambulatory Visit: Payer: Self-pay | Admitting: Lab

## 2012-05-21 ENCOUNTER — Telehealth: Payer: Self-pay

## 2012-05-21 ENCOUNTER — Telehealth: Payer: Self-pay | Admitting: Family Medicine

## 2012-05-21 NOTE — Telephone Encounter (Signed)
PT OF COPLAND.  SAYS SHE HAS BEEN TREATED FOR VERTIGO BEFORE BY TAKING MECLIZINE.  SAYS SHE IS HAVING IT AGAIN AND THINKS IT MAY BE DUE TO BEING SO "STOPPED UP".  WANTS TO KNOW IF WE WILL CALL IN THE MECLIZINE TO [facility] ON [address].  [phone]

## 2012-05-21 NOTE — Telephone Encounter (Signed)
Crystal [name] called her- she feels congested which is causing some vertigo.  She has facial pressure, sneezing, itchy and watery eyes.  She has used some benadryl which has helped.  Wants to know if dramamine might be helpful.  Advised that she could try this instead of benadryl, but not both.  Let me know if not helpful- please give me a call tomorrow if needed

## 2012-05-21 NOTE — Telephone Encounter (Signed)
Left message- to clarify I would not advise her to take several sedating medications at once- eg her phenergan and percocet and xanax along with benadryl or dramamine.  She should use caution and avoid combining these medications.  If she needs to use her xanax use the lower dosage.

## 2012-05-22 ENCOUNTER — Encounter: Payer: Self-pay | Admitting: Specialist

## 2012-05-22 NOTE — Progress Notes (Signed)
I returned a call today to [employer], who inquired about help with managing her medication for depression and bi-polar.  I suggested that she talk with her primary physician, Dr. Warner Mccreedy, who, according to the patient, currently prescribes medication for depression.  I also suggested she talk with Dr. [name] about a referral to a psychiatrist for medication review and management.  The patient had previously seen Dr. Enzo Bi, psychologist, at the [facility], as well as counseling intern, Juanetta Beets.  Ms. Delmont indicated that at some point she might be interested again in counseling.

## 2012-05-28 ENCOUNTER — Ambulatory Visit (HOSPITAL_COMMUNITY)
Admission: RE | Admit: 2012-05-28 | Discharge: 2012-05-28 | Disposition: A | Discharge status: Home / Self Care | Payer: Self-pay | Source: Ambulatory Visit | Attending: Family Medicine | Admitting: Family Medicine

## 2012-05-28 ENCOUNTER — Ambulatory Visit (HOSPITAL_BASED_OUTPATIENT_CLINIC_OR_DEPARTMENT_OTHER)
Admission: RE | Admit: 2012-05-28 | Discharge: 2012-05-28 | Disposition: A | Discharge status: Home / Self Care | Payer: Self-pay | Source: Ambulatory Visit | Attending: Internal Medicine | Admitting: Internal Medicine

## 2012-05-28 ENCOUNTER — Encounter (HOSPITAL_COMMUNITY): Payer: Self-pay

## 2012-05-28 VITALS — BP 132/90 | HR 81 | Wt 139.8 lb

## 2012-05-28 DIAGNOSIS — Z9221 Personal history of antineoplastic chemotherapy: Secondary | ICD-10-CM | POA: Insufficient documentation

## 2012-05-28 DIAGNOSIS — R000 Tachycardia, unspecified: Secondary | ICD-10-CM | POA: Insufficient documentation

## 2012-05-28 DIAGNOSIS — I427 Cardiomyopathy due to drug and external agent: Secondary | ICD-10-CM

## 2012-05-28 DIAGNOSIS — C50919 Malignant neoplasm of unspecified site of unspecified female breast: Secondary | ICD-10-CM | POA: Insufficient documentation

## 2012-05-28 DIAGNOSIS — Z8679 Personal history of other diseases of the circulatory system: Secondary | ICD-10-CM | POA: Insufficient documentation

## 2012-05-28 DIAGNOSIS — R002 Palpitations: Secondary | ICD-10-CM | POA: Insufficient documentation

## 2012-05-28 DIAGNOSIS — T451X5A Adverse effect of antineoplastic and immunosuppressive drugs, initial encounter: Secondary | ICD-10-CM

## 2012-05-28 DIAGNOSIS — I429 Cardiomyopathy, unspecified: Secondary | ICD-10-CM

## 2012-05-28 HISTORY — PX: TRANSTHORACIC ECHOCARDIOGRAM: SHX275

## 2012-05-28 NOTE — Assessment & Plan Note (Addendum)
Have reviewed today's echo.  EF remains stable.  Now that she has finished her herceptin therapy will use carvedilol prn for palpitations.  Follow up 3 months.    Patient seen and examined with Ulyess Blossom, PA-C. We discussed all aspects of the encounter. I agree with the assessment and plan as stated above. I have reviewed echo personally. EF 50-55%. Lateral s' stable. She has completed Herceptin. She is eager to stop carvedilol due to fatigue. I think this is acceptable. She can use it prn for palpitations. Return as needed.

## 2012-05-28 NOTE — Patient Instructions (Addendum)
Can take carvedilol as needed for heart palpitations.  Follow up 3 months

## 2012-05-28 NOTE — Assessment & Plan Note (Signed)
Use carvedilol prn for palpitations.

## 2012-05-28 NOTE — Progress Notes (Signed)
Referring Physician: Dr. [name] Primary Care: Dr. Warner Mccreedy Primary Cardiologist:  none  HPI: Mrs. Nachtigal is a 37 y.o. female with history of seizures (2 episodes with last one ~4 yrs ago), sinus tach and BRCA1 stage II invasive ductal carcinoma, ER/PR/HER-2 positive.  Status post bilateral mastectomy with right axillary lymph node dissection by Dr. Carolynne Edouard on 11/16/10.  She is status post carboplatin/docetaxol/trastuzumab x6, completed mid December 2012.  She is continuing on trastuzumab every 3 weeks for a total of 1 year.  She has also began radiation therapy and will begin antiestrogen therapy once radiation has been completed.  She has been having lymphedema in the right upper extremity and is being seen in the lymphedema clinic.    Echos:  03/03/11: EF 60-65%, lateral S' >16 (top cut off) 06/28/11 EF 50% s' 11.8 08/31/11 EF 45-50% s' 10.2 - herceptin held 10/31/11 EF 55%, 10.5 01/18/12 EF 55% 10.3 03/19/12 EF 50-55% 12.4 05/28/12 EF 50-55% 11.9  She returns for routine follow up today.  She has finished her herceptin therapy as of 12/27.  She continues to feel tired almost all the time.  She is having difficulty sleeping at night.  She is discussing this with her PCP.  She is only taking carvedilol once daily and only when she remembers, hasn't had it in 2 days.     Review of Systems: All pertinent positives/negatives as in HPI, otherwise negative.   Past Medical History  Diagnosis Date  . Allergy   . Right breast ca dx'd 10/2010  . Breast cancer 12/03/10  . Status post chemotherapy     docetaxel/carboplatin/trastuzumab  . Anxiety   . Seizures     last seizure 2 years ago, diagnosed 3 years ago  . Hx MRSA infection 03/28/11    Skin    Current Outpatient Prescriptions  Medication Sig Dispense Refill  . ALPRAZolam (XANAX) 0.5 MG tablet Take 1 tablet (0.5 mg total) by mouth daily. Take one or two pills three times daily as needed for anxiety  180 tablet  2  . carvedilol (COREG)  6.25 MG tablet Take 1.5 tablets (9.375 mg total) by mouth 2 (two) times daily with a meal. Take along with 3.75mg   90 tablet  3  . citalopram (CELEXA) 20 MG tablet Take 2 tablets (40 mg total) by mouth daily. Take one pill a day, then may increase to 2 after one week  60 tablet  5  . goserelin (ZOLADEX) 10.8 MG injection Inject 10.8 mg into the skin every 3 (three) months.      . promethazine (PHENERGAN) 25 MG tablet Take 25 mg by mouth. Take 1 tablet before meals and at bed time for nausea      . ranitidine (ZANTAC) 150 MG capsule Take 150 mg by mouth 2 (two) times daily as needed. For heartburn      . tamoxifen (NOLVADEX) 20 MG tablet Take 1 tablet (20 mg total) by mouth daily.  90 tablet  12  . oxyCODONE-acetaminophen (PERCOCET/ROXICET) 5-325 MG per tablet Take 1 tablet by mouth every 4 (four) hours as needed for pain.  30 tablet  0  . sucralfate (CARAFATE) 1 G tablet Take 1 tablet (1 g total) by mouth 4 (four) times daily.  40 tablet  0    Allergies  Allergen Reactions  . Phenytoin Nausea And Vomiting  . Thorazine (Chlorpromazine Hcl) Hives  . Ciprofloxacin Nausea And Vomiting  . Vancomycin Rash    Possible rash reported by MD  PHYSICAL EXAM: Filed Vitals:   05/28/12 1120  BP: 132/90  Pulse: 81  Weight: 139 lb 12.8 oz (63.413 kg)  SpO2: 99%   General:  Thin, No respiratory difficulty HEENT: normal Neck: supple. no JVD. Carotids 2+ bilat; no bruits. No lymphadenopathy or thryomegaly appreciated. Cor: PMI nondisplaced. regular rhythm. No rubs, gallops or murmurs. Lungs: clear Abdomen: soft, nontender, nondistended. No hepatosplenomegaly. No bruits or masses. Good bowel sounds. Extremities: no cyanosis, clubbing, rash, edema.   Neuro: alert & oriented x 3, cranial nerves grossly intact. moves all 4 extremities w/o difficulty. Affect pleasant.   ASSESSMENT & PLAN:

## 2012-05-28 NOTE — Progress Notes (Signed)
  Echocardiogram 2D Echocardiogram has been performed.  Crystal [name] 05/28/2012, 1:49 PM 

## 2012-06-07 ENCOUNTER — Encounter: Payer: Self-pay | Admitting: Physician Assistant

## 2012-06-07 ENCOUNTER — Ambulatory Visit: Payer: Self-pay

## 2012-06-07 ENCOUNTER — Other Ambulatory Visit: Payer: Self-pay | Admitting: Lab

## 2012-06-07 ENCOUNTER — Other Ambulatory Visit (HOSPITAL_BASED_OUTPATIENT_CLINIC_OR_DEPARTMENT_OTHER): Payer: Self-pay | Admitting: Lab

## 2012-06-07 ENCOUNTER — Telehealth: Payer: Self-pay | Admitting: Oncology

## 2012-06-07 ENCOUNTER — Ambulatory Visit (HOSPITAL_BASED_OUTPATIENT_CLINIC_OR_DEPARTMENT_OTHER): Payer: Self-pay | Admitting: Physician Assistant

## 2012-06-07 VITALS — BP 118/83 | HR 77 | Temp 98.2°F | Resp 20 | Ht 66.5 in | Wt 138.6 lb

## 2012-06-07 DIAGNOSIS — J3489 Other specified disorders of nose and nasal sinuses: Secondary | ICD-10-CM

## 2012-06-07 DIAGNOSIS — C773 Secondary and unspecified malignant neoplasm of axilla and upper limb lymph nodes: Secondary | ICD-10-CM

## 2012-06-07 DIAGNOSIS — C50919 Malignant neoplasm of unspecified site of unspecified female breast: Secondary | ICD-10-CM

## 2012-06-07 DIAGNOSIS — F3132 Bipolar disorder, current episode depressed, moderate: Secondary | ICD-10-CM

## 2012-06-07 DIAGNOSIS — F319 Bipolar disorder, unspecified: Secondary | ICD-10-CM

## 2012-06-07 DIAGNOSIS — R5381 Other malaise: Secondary | ICD-10-CM

## 2012-06-07 LAB — CBC WITH DIFFERENTIAL/PLATELET
Basophils Absolute: 0 10*3/uL (ref 0.0–0.1)
EOS%: 2.4 % (ref 0.0–7.0)
HCT: 41.4 % (ref 34.8–46.6)
HGB: 14.5 g/dL (ref 11.6–15.9)
MCH: 33.8 pg (ref 25.1–34.0)
MCHC: 35.2 g/dL (ref 31.5–36.0)
MCV: 96.1 fL (ref 79.5–101.0)
MONO%: 4.1 % (ref 0.0–14.0)
NEUT%: 74.3 % (ref 38.4–76.8)
RDW: 12.5 % (ref 11.2–14.5)

## 2012-06-07 LAB — COMPREHENSIVE METABOLIC PANEL (CC13)
AST: 94 U/L — ABNORMAL HIGH (ref 5–34)
Alkaline Phosphatase: 78 U/L (ref 40–150)
BUN: 18.2 mg/dL (ref 7.0–26.0)
Creatinine: 0.9 mg/dL (ref 0.6–1.1)

## 2012-06-07 MED ORDER — SODIUM CHLORIDE 0.9 % IJ SOLN
10.0000 mL | INTRAMUSCULAR | Status: DC | PRN
  Administered 2012-06-07: 10 mL via INTRAVENOUS
  Filled 2012-06-07: qty 10

## 2012-06-07 MED ORDER — FLUTICASONE PROPIONATE 50 MCG/ACT NA SUSP: NASAL | Status: DC

## 2012-06-07 MED ORDER — HEPARIN SOD (PORK) LOCK FLUSH 100 UNIT/ML IV SOLN
500.0000 [IU] | Freq: Once | INTRAVENOUS | Status: AC
  Administered 2012-06-07: 500 [IU] via INTRAVENOUS
  Filled 2012-06-07: qty 5

## 2012-06-07 NOTE — Progress Notes (Signed)
ID: Crystal [name]   DOB: [birth year]  MR#: [id number]  JYN#:[id number]  QMV:[id number], MD GYN: SURoyston Sinner, MD OTHER:  [name], MD    HISTORY OF PRESENT ILLNESS: The patient is a 37 year old [place] woman who noted a mass in her right breast and brought it to her primary care physician's attention June of 2012. She was set up for mammography at [facility], where an area of calcifications highly suspicious for carcinoma was biopsied. This was in the upper outer quadrant and it measured 1.7 cm mammographically and 1.8 cm by ultrasonography. The pathology report ([id number]) showed a high-grade invasive ductal carcinoma estrogen receptor positive at 64% progesterone receptor positive at 18%, and HER-2 amplified with a ratio by CISH of 5.24. MIB-1-1 was 65%. A second mass biopsied at the same time was also triple positive.  Given her multiple masses in the right breast, mastectomy was recommended. The patient actually opted for bilateral mastectomies and this was performed together with a right axillary lymph node dissection under Dr. Felicity Pellegrini on 11/16/2010. The final pathology [id number]) showed, on the left, no evidence of cancer. On the right the patient had a high-grade invasive ductal carcinoma measuring 1.9 cm, with negative margins, grade 3, involving 2 of 16 lymph nodes (stage IIB).  Her subsequent history is as detailed below.  INTERVAL HISTORY:  Crystal [name] returns today accompanied by her husband Crystal [name] for followup of her right breast carcinoma. She completed her Herceptin treatments in December. She is continuing with tamoxifen daily, and also receives Zoladex injections every 3 months (due again in March). She chose to keep her port for now, and is having her port flush every 6 weeks as well.  Interval history is remarkable for Crystal [name] having had increased problems with depression, anxiety, and manic episodes. She tends to be more manic in the morning, then is depressed and  withdrawn during the day, and often finds herself "self-medicating with alcohol" at night. In the past, she has declined referral to a psychiatrist, but is very open to that suggestion today.   is doing well with the Herceptin, which she will complete next week. She's also tolerating the goserelin, which she appreciates particularly because it totally takes care of her perimenstrual symptoms, which were severe. Of course it does cause vaginal dryness and there taking care of that would lubricants. She has some heartburn issues, hot flashes, occasional headaches, and of course issues with depression, although that seems to be well under control at present.   REVIEW OF SYSTEMS: Physically, Crystal [name]'s biggest complaint is fatigue. She has no energy. She often has difficulty sleeping at night. She's had no fevers or chills. She has occasional hot flashes. She's had no vaginal bleeding but does have some vaginal dryness. She has occasional nausea but no emesis. She has chronic issues with constipation, but this has not worsened or changed. She's had no cough, shortness of breath, or chest pain. She has occasional headaches, nothing that she considers abnormal, and she denies any dizziness or change in vision. She currently denies any myalgias, arthralgias, bony pain, or peripheral swelling.  A detailed review of systems is otherwise stable and noncontributory.   PAST MEDICAL HISTORY: Past Medical History  Diagnosis Date  . Allergy   . Right breast ca dx'd 10/2010  . Breast cancer 12/03/10  . Status post chemotherapy     docetaxel/carboplatin/trastuzumab  . Anxiety   . Seizures     last seizure 2 years ago, diagnosed 3 years ago  .  Hx MRSA infection 03/28/11    Skin  1. History of seizures. The patient has been thoroughly evaluated both here by Dr. [name] and at [facility] for this. Among many studies, she had an MRI of the brain on August 28th. This showed an incidental solitary small lesion in  the left frontoparietal white matter. It was not felt to be suggestive of multiple sclerosis. A noncontrasted CT in August 2010 was negative. The patient has been off all seizure medications now for 2 months. There has been no evidence of seizure recurrence. 2. Complex psychology history (please refer to Dr. [name] note in the E-chart from August 29, 2009) with evidence of anxiety disorder, depression and psychosis. The patient is currently on no medications for this and denies any of these symptoms at present. 3. History of migraines. 4. Remote history of multidrug abuse. 5. Status post wisdom teeth extraction.  History of MRSA skin infection   PAST SURGICAL HISTORY: Past Surgical History  Procedure Date  . Wisdom tooth extraction   . Breast surgery 11/16/2010    bilateral mastectomy+Ralnd,T1cN1a, Her2+,ERPR+  . Biopsy breast 10/14/2010    right needle core biopsy  . Portacath placement 11/16/2010    placement of left subclavian port  . I&d extremity 09/26/2011    Procedure: IRRIGATION AND DEBRIDEMENT EXTREMITY;  Surgeon: [name] Ribas, MD;  Location: [facility];  Service: Orthopedics;  Laterality: Right;  I&D right hand cat bite wound    FAMILY HISTORY Family History  Problem Relation Age of Onset  . Cancer Maternal Grandfather   The patient's mother is living, in her mid 60s, with no history of breast or ovarian cancer. The patient does not know her biological father. The patient has an older brother with bipolar disease, and according to the patient, borderline schizophrenia.   GYNECOLOGIC HISTORY: Menarche at age 83. She is G1, P1 with first pregnancy to term at age 69. The patient had significant pelvic pain recently and was evaluated with transvaginal and pelvic ultrasound on July 17th by Dr. Natale Milch. This showed normal uterine myometrium and endometrium with bilateral functional ovarian cysts. There were no worrisome features. She is currently on every three-month goserelin.  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 Her husband of 4 years, Crystal [name], works as a Administrator. In addition to them at home is the patient's son, Crystal [name], 43 years old attends [employer] in the tenth grade. [name], according to the patient, has significant nausea problems. Currently the girl friend of a friend of Daphna and Carl's is living with them as well. Apparently the friend died from a gunshot wound to the head, but it is not clear what the circumstances were. The patient attends a local [address]. [name] is pastor.    ADVANCED DIRECTIVES: Not in place  HEALTH MAINTENANCE: History  Substance Use Topics  . Smoking status: Current Some Day Smoker -- 2.0 packs/day for 20 years    Types: Cigarettes  . Smokeless tobacco: Never Used  . Alcohol Use: Yes     Comment: occa     Colonoscopy:  PAP:  Bone density:  Lipid panel:  Allergies  Allergen Reactions  . Phenytoin Nausea And Vomiting  . Thorazine (Chlorpromazine Hcl) Hives  . Ciprofloxacin Nausea And Vomiting  . Vancomycin Rash    Possible rash reported by MD    Current Outpatient Prescriptions  Medication Sig Dispense Refill  . ALPRAZolam (  XANAX) 0.5 MG tablet Take 1 tablet (0.5 mg total) by mouth daily. Take one or two pills three times daily as needed for anxiety  180 tablet  2  . carvedilol (COREG) 6.25 MG tablet Take 1.5 tablets (9.375 mg total) by mouth 2 (two) times daily with a meal. Take along with 3.75mg   90 tablet  3  . citalopram (CELEXA) 20 MG tablet Take 2 tablets (40 mg total) by mouth daily. Take one pill a day, then may increase to 2 after one week  60 tablet  5  . goserelin (ZOLADEX) 10.8 MG injection Inject 10.8 mg into the skin every 3 (three) months.      . ranitidine (ZANTAC) 150 MG capsule Take 150 mg by mouth 2 (two) times daily as needed. For heartburn      . tamoxifen (NOLVADEX) 20 MG tablet Take 1 tablet (20 mg total) by  mouth daily.  90 tablet  12  . fluticasone (FLONASE) 50 MCG/ACT nasal spray 1 spray in each nostril twice daily  16 g  2  . oxyCODONE-acetaminophen (PERCOCET/ROXICET) 5-325 MG per tablet Take 1 tablet by mouth every 4 (four) hours as needed for pain.  30 tablet  0  . promethazine (PHENERGAN) 25 MG tablet Take 25 mg by mouth. Take 1 tablet before meals and at bed time for nausea      . sucralfate (CARAFATE) 1 G tablet Take 1 tablet (1 g total) by mouth 4 (four) times daily.  40 tablet  0   OBJECTIVE: Young white female who appears anxious and  depressed Filed Vitals:   06/07/12 1002  BP: 118/83  Pulse: 77  Temp: 98.2 F (36.8 C)  Resp: 20  ECOG: 1 Filed Weights   06/07/12 1002  Weight: 138 lb 9.6 oz (62.869 kg)   Physical Exam: HEENT:  Sclerae anicteric.  Oropharynx clear.  Nodes:  No cervical or supraclavicular lymphadenopathy  Breast Exam: Status post bilateral mastectomies. No evidence of local recurrence. Both axillae are clear. Lungs:  Clear to auscultation bilaterally.   Heart:  Regular rate and rhythm.   Abdomen:  Soft, nontender.  Positive bowel sounds.  Musculoskeletal:  No focal spinal tenderness Extremities:  No peripheral edema  Neuro:  Nonfocal, well oriented with depressed affect   LAB RESULTS: Lab Results  Component Value Date   WBC 8.3 06/07/2012   NEUTROABS 6.2 06/07/2012   HGB 14.5 06/07/2012   HCT 41.4 06/07/2012   MCV 96.1 06/07/2012   PLT 176 06/07/2012      Chemistry      Component Value Date/Time   NA 140 06/07/2012 0932   NA 138 02/21/2012 1410   K 4.4 06/07/2012 0932   K 3.8 02/21/2012 1410   CL 106 06/07/2012 0932   CL 102 02/21/2012 1410   CO2 24 06/07/2012 0932   CO2 26 02/21/2012 1410   BUN 18.2 06/07/2012 0932   BUN 11 02/21/2012 1410   CREATININE 0.9 06/07/2012 0932   CREATININE 0.79 02/21/2012 1410      Component Value Date/Time   CALCIUM 9.1 06/07/2012 0932   CALCIUM 9.4 02/21/2012 1410   ALKPHOS 78 06/07/2012 0932   ALKPHOS 84 12/12/2011 1451   AST  94* 06/07/2012 0932   AST 18 12/12/2011 1451   ALT 35 06/07/2012 0932   ALT 11 12/12/2011 1451   BILITOT 0.43 06/07/2012 0932   BILITOT 0.4 12/12/2011 1451       Lab Results  Component Value Date   LABCA2 16  08/24/2011     STUDIES: No results found.   ASSESSMENT: 37 year old [place] woman, known to be BRCA1 and 2 negative,   (1) status post bilateral mastectomies May of 2012 for a right-sided T1c N1 (stage II) grade 3 invasive ductal carcinoma,estrogen and progesterone receptor positive, HER-2 amplified, with an MIB-1-1 of 85%.   (2) status post carboplatin/docetaxol/trastuzumab x6, completed mid December 2012   (3) on trastuzumab every 3 weeks, started August of 2012, interrupted between April and August of 2013, completed 04/27/2012. Most recent echo 03/19/2012  (4) status post radiation therapy, completed 07/12/2011  (5)  On tamoxifen since April 2013   (6) On goserelin given every 3 months, 1st injection on 10/26/11, most recent dose 04/27/2012.  PLAN:  This case was reviewed with Dr. [name] who also spoke with the patient and her husband today. Aleyda is doing very well with regards to her breast cancer, and will continue on tamoxifen as before. She is due for her next port flush and Zoladex injection in 6 weeks, March 20.  Over half of our 50 minute appointment today was spent counseling the patient regarding her prognosis, her problems with depression, and her plan of care.  The biggest problem Delania is having currently is the instability caused by her apparent bipolar disorder. She is finally willing to pursue psychiatric care, and accordingly we are referring her to  [facility] for the next available appointment.   Her AST was a little elevated today, and I will recheck this when she returns in 6 weeks. She also understands that is important to limit her alcohol intake, and we will reassess when she returns.  This was reviewed thoroughly with both Saiya  and her husband Crystal [name] today. They voiced understanding and agreement with our plan, and will call with any changes or problems.    Jhovani Griswold    06/07/2012

## 2012-06-15 DIAGNOSIS — M79609 Pain in unspecified limb: Secondary | ICD-10-CM

## 2012-06-17 ENCOUNTER — Emergency Department (HOSPITAL_COMMUNITY)
Admission: EM | Admit: 2012-06-17 | Discharge: 2012-06-17 | Disposition: A | Discharge status: Home / Self Care | Payer: Self-pay | Attending: Emergency Medicine | Admitting: Emergency Medicine

## 2012-06-17 ENCOUNTER — Emergency Department (HOSPITAL_COMMUNITY): Payer: Self-pay

## 2012-06-17 ENCOUNTER — Encounter (HOSPITAL_COMMUNITY): Payer: Self-pay | Admitting: Emergency Medicine

## 2012-06-17 DIAGNOSIS — M25519 Pain in unspecified shoulder: Secondary | ICD-10-CM | POA: Insufficient documentation

## 2012-06-17 DIAGNOSIS — M25512 Pain in left shoulder: Secondary | ICD-10-CM

## 2012-06-17 DIAGNOSIS — Z8614 Personal history of Methicillin resistant Staphylococcus aureus infection: Secondary | ICD-10-CM | POA: Insufficient documentation

## 2012-06-17 DIAGNOSIS — Z853 Personal history of malignant neoplasm of breast: Secondary | ICD-10-CM | POA: Insufficient documentation

## 2012-06-17 DIAGNOSIS — Z9221 Personal history of antineoplastic chemotherapy: Secondary | ICD-10-CM | POA: Insufficient documentation

## 2012-06-17 DIAGNOSIS — Z9889 Other specified postprocedural states: Secondary | ICD-10-CM | POA: Insufficient documentation

## 2012-06-17 DIAGNOSIS — Z9010 Acquired absence of unspecified breast and nipple: Secondary | ICD-10-CM | POA: Insufficient documentation

## 2012-06-17 DIAGNOSIS — Z8669 Personal history of other diseases of the nervous system and sense organs: Secondary | ICD-10-CM | POA: Insufficient documentation

## 2012-06-17 DIAGNOSIS — Z79899 Other long term (current) drug therapy: Secondary | ICD-10-CM | POA: Insufficient documentation

## 2012-06-17 DIAGNOSIS — F17200 Nicotine dependence, unspecified, uncomplicated: Secondary | ICD-10-CM | POA: Insufficient documentation

## 2012-06-17 DIAGNOSIS — IMO0002 Reserved for concepts with insufficient information to code with codable children: Secondary | ICD-10-CM | POA: Insufficient documentation

## 2012-06-17 DIAGNOSIS — F411 Generalized anxiety disorder: Secondary | ICD-10-CM | POA: Insufficient documentation

## 2012-06-17 MED ORDER — MORPHINE SULFATE 4 MG/ML IJ SOLN
4.0000 mg | Freq: Once | INTRAMUSCULAR | Status: AC
  Administered 2012-06-17: 4 mg via INTRAVENOUS

## 2012-06-17 MED ORDER — SODIUM CHLORIDE 0.9 % IV BOLUS (SEPSIS)
500.0000 mL | Freq: Once | INTRAVENOUS | Status: AC
  Administered 2012-06-17: 500 mL via INTRAVENOUS

## 2012-06-17 MED ORDER — HEPARIN SOD (PORK) LOCK FLUSH 100 UNIT/ML IV SOLN
INTRAVENOUS | Status: AC
  Filled 2012-06-17: qty 5

## 2012-06-17 MED ORDER — MORPHINE SULFATE 4 MG/ML IJ SOLN: 4.0000 mg | Freq: Once | INTRAMUSCULAR | Status: DC

## 2012-06-17 MED ORDER — OXYCODONE-ACETAMINOPHEN 5-325 MG PO TABS
1.0000 | ORAL_TABLET | Freq: Once | ORAL | Status: AC
  Administered 2012-06-17: 1 via ORAL
  Filled 2012-06-17: qty 1

## 2012-06-17 MED ORDER — OXYCODONE-ACETAMINOPHEN 5-325 MG PO TABS: 1.0000 | ORAL_TABLET | Freq: Four times a day (QID) | ORAL | Status: DC | PRN

## 2012-06-17 MED ORDER — IOHEXOL 300 MG/ML  SOLN
100.0000 mL | Freq: Once | INTRAMUSCULAR | Status: AC | PRN
  Administered 2012-06-17: 100 mL via INTRAVENOUS

## 2012-06-17 MED ORDER — MORPHINE SULFATE 4 MG/ML IJ SOLN
INTRAMUSCULAR | Status: AC
  Administered 2012-06-17: 4 mg via INTRAVENOUS
  Filled 2012-06-17: qty 1

## 2012-06-17 NOTE — ED Notes (Addendum)
Port a cath accessed without difficulty. 15cc blood return obtained. Easily flushes. NS bolus infusing without difficulty. Pt denies pain with access or infusion. Rubin Payor, MD made aware. Sts okay to use port for CT contrast administration. CT tech, Amy, made aware.

## 2012-06-17 NOTE — Progress Notes (Signed)
VASCULAR LAB PRELIMINARY  PRELIMINARY  PRELIMINARY  PRELIMINARY  Left upper extremity venous Doppler completed.    Preliminary report:  There is no obvious evidence of DVT or SVT noted in the left upper extremity. Aron Inge, RVT 06/17/2012, 1:00 PM

## 2012-06-17 NOTE — ED Notes (Signed)
Port de-accessed

## 2012-06-17 NOTE — ED Provider Notes (Signed)
History     CSN: [id number]  Arrival date & time 06/17/12  0931   First MD Initiated Contact with Patient 06/17/12 1131      Chief Complaint  Patient presents with  . pain at port site     (Consider location/radiation/quality/duration/timing/severity/associated sxs/prior treatment) HPI Patient presents with left shoulder pain for the last 2 days. She has a Port-A-Cath in the chest from previous chemotherapy for breast cancer. She states that at her last visit they had difficulty drawing blood of it, but flushed alright. No swelling of the extremity, however the veins on her chest and arm are more prominent. She states she was sent in to rule out blood clot. No chest pain or trouble breathing. No fevers. The pain is worse with palpation. She states the pain goes down the arm and up her neck now. Past Medical History  Diagnosis Date  . Allergy   . Status post chemotherapy     docetaxel/carboplatin/trastuzumab  . Anxiety   . Seizures     last seizure 2 years ago, diagnosed 3 years ago  . Hx MRSA infection 03/28/11    Skin  . Right breast ca dx'd 10/2010  . Breast cancer 12/03/10    Past Surgical History  Procedure Laterality Date  . Wisdom tooth extraction    . Breast surgery  11/16/2010    bilateral mastectomy+Ralnd,T1cN1a, Her2+,ERPR+  . Biopsy breast  10/14/2010    right needle core biopsy  . Portacath placement  11/16/2010    placement of left subclavian port  . I&d extremity  09/26/2011    Procedure: IRRIGATION AND DEBRIDEMENT EXTREMITY;  Surgeon: [name] Ribas, MD;  Location: [facility];  Service: Orthopedics;  Laterality: Right;  I&D right hand cat bite wound    Family History  Problem Relation Age of Onset  . Cancer Maternal Grandfather     History  Substance Use Topics  . Smoking status: Current Some Day Smoker -- 2.00 packs/day for 20 years    Types: Cigarettes  . Smokeless tobacco: Never Used  . Alcohol Use: Yes     Comment: occa    OB History   Grav Para  Term Preterm Abortions TAB SAB Ect Mult Living   1 1 1  0 0 0 0 0 0 0      Review of Systems  Constitutional: Negative for activity change and appetite change.  HENT: Negative for neck stiffness.   Eyes: Negative for pain.  Respiratory: Negative for chest tightness and shortness of breath.   Cardiovascular: Negative for chest pain and leg swelling.  Gastrointestinal: Negative for nausea, vomiting, abdominal pain and diarrhea.  Genitourinary: Negative for flank pain.  Musculoskeletal: Negative for back pain.       Left shoulder pain  Skin: Negative for rash.  Neurological: Negative for weakness, numbness and headaches.  Psychiatric/Behavioral: Negative for behavioral problems.    Allergies  Phenytoin; Thorazine; Ciprofloxacin; and Vancomycin  Home Medications   Current Outpatient Rx  Name  Route  Sig  Dispense  Refill  . ALPRAZolam (XANAX) 0.5 MG tablet   Oral   Take 0.5 mg by mouth 3 (three) times daily as needed for anxiety.         . carvedilol (COREG) 6.25 MG tablet   Oral   Take 6.25 mg by mouth daily as needed. For heart palpitations (rapid heart beat)         . citalopram (CELEXA) 20 MG tablet   Oral   Take 40 mg by  mouth every evening.          . fluticasone (FLONASE) 50 MCG/ACT nasal spray      1 spray in each nostril twice daily   16 g   2   . goserelin (ZOLADEX) 10.8 MG injection   Subcutaneous   Inject 10.8 mg into the skin every 3 (three) months.         Marland Kitchen ibuprofen (ADVIL,MOTRIN) 200 MG tablet   Oral   Take 200 mg by mouth every 6 (six) hours as needed for pain, fever or headache.         . promethazine (PHENERGAN) 25 MG tablet   Oral   Take 25 mg by mouth. Take 1 tablet before meals and at bed time for nausea         . ranitidine (ZANTAC) 150 MG tablet   Oral   Take 150 mg by mouth 2 (two) times daily as needed for heartburn.         . tamoxifen (NOLVADEX) 20 MG tablet   Oral   Take 20 mg by mouth every evening.         Marland Kitchen  oxyCODONE-acetaminophen (PERCOCET/ROXICET) 5-325 MG per tablet   Oral   Take 1-2 tablets by mouth every 6 (six) hours as needed for pain.   10 tablet   0     BP 137/83  Pulse 89  Temp(Src) 98.2 F (36.8 C) (Oral)  SpO2 100%  Physical Exam  Nursing note and vitals reviewed. Constitutional: She is oriented to person, place, and time. She appears well-developed and well-nourished.  HENT:  Head: Normocephalic and atraumatic.  Eyes: EOM are normal. Pupils are equal, round, and reactive to light.  Neck: Normal range of motion. Neck supple.  Cardiovascular: Normal rate, regular rhythm and normal heart sounds.   No murmur heard. Pulmonary/Chest: Effort normal and breath sounds normal. No respiratory distress. She has no wheezes. She has no rales.  Bilateral mastectomies. Port-A-Cath to left chest wall. Prominent veins on left upper chest wall. Mild tenderness to supraclavicular area. No masses. Neurovascular intact in left upper extremity without edema.  Abdominal: Soft. Bowel sounds are normal. She exhibits no distension. There is no tenderness. There is no rebound and no guarding.  Musculoskeletal: Normal range of motion.  Neurological: She is alert and oriented to person, place, and time. No cranial nerve deficit.  Skin: Skin is warm and dry.  Psychiatric: She has a normal mood and affect. Her speech is normal.    ED Course  Procedures (including critical care time)  Labs Reviewed - No data to display Dg Chest 2 View  06/17/2012  *RADIOLOGY REPORT*  Clinical Data: Confirm Port-A-Cath position.  CHEST - 2 VIEW  Comparison: 02/21/2012  Findings: Left Port-A-Cath is in place with the tip in the SVC.  No pneumothorax.  No apparent change since prior study in the position of the Port-A-Cath.  Surgical clips in the right axilla.  Heart is normal size.  Lungs are clear.  No effusions or acute bony abnormality.  IMPRESSION: Left Port-A-Cath tip in the SVC, in stable position.  No acute  cardiopulmonary disease.   Original Report Authenticated By: Charlett Nose, M.D.    Ct Chest W Contrast  06/17/2012  *RADIOLOGY REPORT*  Clinical Data: Left shoulder pain.  Evaluate for DVT.  CT CHEST WITH CONTRAST  Technique:  Multidetector CT imaging of the chest was performed following the standard protocol during bolus administration of intravenous contrast. Venous phase imaging was performed.  Contrast: OMNIPAQUE IOHEXOL 300 MG/ML  SOLN  Comparison: 02/21/2012  Findings: Left subclavian vein Port-A-Cath is stable in position. Tip is in the lower SVC.  The left brachial vein, left axillary vein, left innominate vein, SVC, and left internal jugular vein are widely patent.  Similarly, the right internal jugular vein, right innominate vein, right axillary vein, and right brachial veins are widely patent.  There is narrowing of both subclavian veins at the thoracic outlet , but no evidence of a low density filling defect within the subclavian veins.  Specifically, there is no definite low density filling defect surrounding the left subclavian vein Port-A-Cath in the left subclavian vein.  There are no obvious filling defects in the pulmonary arterial tree to suggest acute pulmonary thromboembolism.  Aberrant right subclavian artery noted.  No pneumothorax and no pleural effusion.  Ground-glass opacities in the anterior right upper lobe have improved.  Ground-glass in the inferior right middle lobe on image 49 have also improved.  9 mm pleural based density at the right base is stable.  Left lung is grossly clear.  No destructive bone lesion.  IMPRESSION: No evidence of DVT within the central venous structures. Specifically, no evidence of DVT within the left subclavian or innominate veins.  There is narrowing of both subclavian veins in the thoracic outlets.  Ground-glass opacities in the right lung have further improved.   Original Report Authenticated By: Jolaine Click, M.D.      1. Shoulder pain, acute,  left       MDM  Patient with pain in her left shoulder. Has a Port-A-Cath on that side. Since rule out DVT. Negative Doppler and negative CT venogram. No clear cause found for pain. Doubt cardiac cause. Doubt PE. CT did not show a cause for the pain. Patient be discharged home. I discussed the patient with Dr. Arline Asp.        Juliet Rude. Rubin Payor, MD 06/17/12 1531

## 2012-06-17 NOTE — ED Notes (Signed)
Patient reports that she has pain to her left chest and up into her neck where her Raechel Ache is. The patient states that she had it flushed on the 6th and there were complications with the flush and blood draw

## 2012-06-18 ENCOUNTER — Inpatient Hospital Stay (HOSPITAL_COMMUNITY)
Admission: AD | Admit: 2012-06-18 | Discharge: 2012-06-18 | Disposition: A | Discharge status: Home / Self Care | Payer: Self-pay | Source: Ambulatory Visit | Attending: Obstetrics & Gynecology | Admitting: Obstetrics & Gynecology

## 2012-06-18 ENCOUNTER — Encounter (HOSPITAL_COMMUNITY): Payer: Self-pay | Admitting: *Deleted

## 2012-06-18 DIAGNOSIS — N8111 Cystocele, midline: Secondary | ICD-10-CM | POA: Insufficient documentation

## 2012-06-18 DIAGNOSIS — IMO0002 Reserved for concepts with insufficient information to code with codable children: Secondary | ICD-10-CM

## 2012-06-18 LAB — URINALYSIS, ROUTINE W REFLEX MICROSCOPIC
Bilirubin Urine: NEGATIVE
Hgb urine dipstick: NEGATIVE
Ketones, ur: NEGATIVE mg/dL
Protein, ur: NEGATIVE mg/dL
Urobilinogen, UA: 0.2 mg/dL (ref 0.0–1.0)

## 2012-06-18 NOTE — MAU Note (Signed)
Pt needing to use restroom, taken to br then straight to rm.

## 2012-06-18 NOTE — MAU Provider Note (Signed)
Attestation of Attending Supervision of Advanced Practitioner (CNM/NP): Evaluation and management procedures were performed by the Advanced Practitioner under my supervision and collaboration.  I have reviewed the Advanced Practitioner's note and chart, and I agree with the management and plan.  HARRAWAY-SMITH, Carlina Derks 7:13 PM

## 2012-06-18 NOTE — MAU Provider Note (Signed)
History    CSN: [id number]  Arrival date and time: 06/18/12 1406   First Provider Initiated Contact with Patient 06/18/12 1500      Chief Complaint  Patient presents with  . "bladder problems"    HPI Crystal [name] is a 37 yo female who presents to the MAU complaining of "bladder problems". She reports that she has been urinating anywhere from 50-60 times a day since 2008. She reports that recently, she has felt and seen something bulging from her vagina. She reports that this bulging is what brought her in today. When she bears down or sits up the bulging becomes more prominent. She states that when she goes to the bathroom she is having to strain to urinate, but that every single time she goes to the restroom she does produce urine. She denies pain with urination or any blood in her urine. She reports only 1-2 UTIs over her lifetime. She also denies abdominal pain or cramping.  She reports bladder incontinence with laughing or coughing over the last few years as well. She denies any abnormal vaginal discharge or bleeding. She reports mild pain with sex, but reports that it seems to be improving recently. She has just finished chemotherapy for treatment of breast cancer, and is currently receiving Zoladex injection and taking tamoxifen.  OB History   Grav Para Term Preterm Abortions TAB SAB Ect Mult Living   1 1 1  0 0 0 0 0 0 1      Past Medical History  Diagnosis Date  . Allergy   . Status post chemotherapy     docetaxel/carboplatin/trastuzumab  . Anxiety   . Seizures     last seizure 2 years ago, diagnosed 3 years ago  . Hx MRSA infection 03/28/11    Skin  . Right breast ca dx'd 10/2010  . Breast cancer 12/03/10    Past Surgical History  Procedure Laterality Date  . Wisdom tooth extraction    . Breast surgery  11/16/2010    bilateral mastectomy+Ralnd,T1cN1a, Her2+,ERPR+  . Biopsy breast  10/14/2010    right needle core biopsy  . Portacath placement  11/16/2010    placement  of left subclavian port  . I&d extremity  09/26/2011    Procedure: IRRIGATION AND DEBRIDEMENT EXTREMITY;  Surgeon: [name] Ribas, MD;  Location: [facility];  Service: Orthopedics;  Laterality: Right;  I&D right hand cat bite wound    Family History  Problem Relation Age of Onset  . Cancer Maternal Grandfather     History  Substance Use Topics  . Smoking status: Current Some Day Smoker -- 2.00 packs/day for 20 years    Types: Cigarettes  . Smokeless tobacco: Never Used  . Alcohol Use: Yes     Comment: occa    Allergies:  Allergies  Allergen Reactions  . Phenytoin Nausea And Vomiting  . Thorazine (Chlorpromazine Hcl) Hives  . Ciprofloxacin Nausea And Vomiting  . Vancomycin Rash    Possible rash reported by MD    No prescriptions prior to admission    Review of Systems  Constitutional: Negative for fever.  Gastrointestinal: Negative for nausea, vomiting and abdominal pain.  Genitourinary: Positive for urgency and frequency. Negative for dysuria, hematuria and flank pain.    Physical Exam   Blood pressure 112/77, pulse 81, temperature 98.1 F (36.7 C), temperature source Oral, resp. rate 16.  Physical Exam Physical Examination: General appearance - alert, well appearing, and in no distress and oriented to person, place, and time Abdomen -  soft, nontender, nondistended, no masses or organomegaly Genitourinary - normal external genitalia, no lesions, cystocele becomes visible when patient bears down Bimanual Pelvic- upon patient bearing down anterior shifting of vaginal wall   MAU Course  Procedures  Results for orders placed during the hospital encounter of 06/18/12 (from the past 24 hour(s))  URINALYSIS, ROUTINE W REFLEX MICROSCOPIC     Status: None   Collection Time    06/18/12  2:05 PM      Result Value Range   Color, Urine YELLOW  YELLOW   APPearance CLEAR  CLEAR   Specific Gravity, Urine 1.010  1.005 - 1.030   pH 5.5  5.0 - 8.0   Glucose, UA NEGATIVE  NEGATIVE  mg/dL   Hgb urine dipstick NEGATIVE  NEGATIVE   Bilirubin Urine NEGATIVE  NEGATIVE   Ketones, ur NEGATIVE  NEGATIVE mg/dL   Protein, ur NEGATIVE  NEGATIVE mg/dL   Urobilinogen, UA 0.2  0.0 - 1.0 mg/dL   Nitrite NEGATIVE  NEGATIVE   Leukocytes, UA NEGATIVE  NEGATIVE  POCT PREGNANCY, URINE     Status: None   Collection Time    06/18/12  2:26 PM      Result Value Range   Preg Test, Ur NEGATIVE  NEGATIVE    Assessment and Plan  Assessment: Crystal [name] is a 37 yo female who presents to the MAU complaining of "bladder problems". While observing the patient while bearing down, the patient has a visible cystocele. No evidence of UTI on urinalysis.   Plan:  1. Refer patient to outpatient GYN clinic for further counseling about options 2. Suggest patient perform Kegel exercises while awaiting her appointment  Adela Glimpse 06/18/2012, 4:29 PM   I saw and examined patient along with student and agree with above note.   [facility] 06/18/2012 6:34 PM

## 2012-06-18 NOTE — MAU Note (Signed)
States she "pee's 50-60 times a day." States she feels and sees something "pushing out down there." Patient is S/P chemo for breast cancer. Completed tx a couple of months ago. Still has portocath. Had it checked yesterday.

## 2012-06-20 ENCOUNTER — Inpatient Hospital Stay (HOSPITAL_COMMUNITY)
Admission: AD | Admit: 2012-06-20 | Discharge: 2012-06-20 | Disposition: A | Discharge status: Home / Self Care | Payer: Self-pay | Source: Ambulatory Visit | Attending: Obstetrics & Gynecology | Admitting: Obstetrics & Gynecology

## 2012-06-20 ENCOUNTER — Encounter (HOSPITAL_COMMUNITY): Payer: Self-pay | Admitting: *Deleted

## 2012-06-20 DIAGNOSIS — IMO0002 Reserved for concepts with insufficient information to code with codable children: Secondary | ICD-10-CM

## 2012-06-20 DIAGNOSIS — R109 Unspecified abdominal pain: Secondary | ICD-10-CM | POA: Insufficient documentation

## 2012-06-20 DIAGNOSIS — N8111 Cystocele, midline: Secondary | ICD-10-CM | POA: Insufficient documentation

## 2012-06-20 DIAGNOSIS — N814 Uterovaginal prolapse, unspecified: Secondary | ICD-10-CM

## 2012-06-20 MED ORDER — OXYCODONE-ACETAMINOPHEN 5-325 MG PO TABS
2.0000 | ORAL_TABLET | Freq: Once | ORAL | Status: AC
  Administered 2012-06-20: 2 via ORAL
  Filled 2012-06-20: qty 2

## 2012-06-20 MED ORDER — OXYCODONE-ACETAMINOPHEN 5-325 MG PO TABS: 1.0000 | ORAL_TABLET | Freq: Four times a day (QID) | ORAL | Status: DC | PRN

## 2012-06-20 NOTE — Progress Notes (Signed)
Pt states pain is in her bladder area "and when she goes to pee she can see her bladder"

## 2012-06-20 NOTE — MAU Note (Signed)
Pt states she has been seen 06/18/2012. Pt states she was told that her bladder had fallen. Pt states the earliest appointment  Was in March and pt  Feels she is in too much pain to wait that long

## 2012-06-20 NOTE — Progress Notes (Signed)
Bladder noted on exam

## 2012-06-20 NOTE — MAU Provider Note (Signed)
History     CSN: [id number]  Arrival date and time: 06/20/12 1416   First Provider Initiated Contact with Patient 06/20/12 1520      Chief Complaint  Patient presents with  . Abdominal Pain   HPI [name] is a 37 y.o. G1P1001 who presents to MAU today with complaint of cystocele with prolapse. This was evaluated by Georges Mouse, CNM on 06/18/12. The patient was given an appointment to be seen in the clinic on 07/02/12. She feels that the pain is significantly worse today. She also feels that the prolapse is worse today. She is having to urinate very frequently and has to strain to urinate often. She denies fever or pain with urination. Patient is in a significant amount of discomfort.   OB History   Grav Para Term Preterm Abortions TAB SAB Ect Mult Living   1 1 1  0 0 0 0 0 0 1      Past Medical History  Diagnosis Date  . Allergy   . Status post chemotherapy     docetaxel/carboplatin/trastuzumab  . Anxiety   . Seizures     last seizure 2 years ago, diagnosed 3 years ago  . Hx MRSA infection 03/28/11    Skin  . Right breast ca dx'd 10/2010  . Breast cancer 12/03/10    Past Surgical History  Procedure Laterality Date  . Wisdom tooth extraction    . Breast surgery  11/16/2010    bilateral mastectomy+Ralnd,T1cN1a, Her2+,ERPR+  . Biopsy breast  10/14/2010    right needle core biopsy  . Portacath placement  11/16/2010    placement of left subclavian port  . I&d extremity  09/26/2011    Procedure: IRRIGATION AND DEBRIDEMENT EXTREMITY;  Surgeon: [name] Ribas, MD;  Location: [facility];  Service: Orthopedics;  Laterality: Right;  I&D right hand cat bite wound    Family History  Problem Relation Age of Onset  . Cancer Maternal Grandfather     History  Substance Use Topics  . Smoking status: Current Some Day Smoker -- 2.00 packs/day for 20 years    Types: Cigarettes  . Smokeless tobacco: Never Used  . Alcohol Use: Yes     Comment: occa    Allergies:  Allergies   Allergen Reactions  . Tramadol Other (See Comments)    Seizures  . Phenytoin Nausea And Vomiting  . Thorazine (Chlorpromazine Hcl) Hives  . Ciprofloxacin Nausea And Vomiting  . Vancomycin Rash    Possible rash reported by MD    Prescriptions prior to admission  Medication Sig Dispense Refill  . ALPRAZolam (XANAX) 0.5 MG tablet Take 0.5 mg by mouth 3 (three) times daily as needed for anxiety.      . carvedilol (COREG) 6.25 MG tablet Take 6.25 mg by mouth daily as needed. For heart palpitations (rapid heart beat)      . citalopram (CELEXA) 20 MG tablet Take 40 mg by mouth every evening.       Marland Kitchen DM-Phenylephrine-Acetaminophen (ALKA-SELTZER PLS SINUS & COUGH PO) Take 2 tablets by mouth 3 (three) times daily as needed (for sinus pressure).      Marland Kitchen ibuprofen (ADVIL,MOTRIN) 200 MG tablet Take 200 mg by mouth every 6 (six) hours as needed for pain, fever or headache.      . promethazine (PHENERGAN) 25 MG tablet Take 25 mg by mouth. Take 1 tablet before meals and at bed time for nausea      . ranitidine (ZANTAC) 150 MG tablet Take  150 mg by mouth 2 (two) times daily as needed for heartburn.      . tamoxifen (NOLVADEX) 20 MG tablet Take 20 mg by mouth every evening.      . [DISCONTINUED] oxyCODONE-acetaminophen (PERCOCET/ROXICET) 5-325 MG per tablet Take 1-2 tablets by mouth every 6 (six) hours as needed for pain.  10 tablet  0  . goserelin (ZOLADEX) 10.8 MG injection Inject 10.8 mg into the skin every 3 (three) months.        Review of Systems  Constitutional: Negative for fever, chills and malaise/fatigue.  Gastrointestinal: Positive for abdominal pain. Negative for nausea and vomiting.  Genitourinary: Negative for dysuria, urgency and frequency.   Physical Exam   Blood pressure 137/93, pulse 97, temperature 97.8 F (36.6 C), temperature source Oral, resp. rate 18, height 5\' 6"  (1.676 m), weight 138 lb 6.4 oz (62.778 kg), SpO2 100.00%.  Physical Exam  Constitutional: She is oriented to  person, place, and time. She appears well-developed and well-nourished. No distress.  HENT:  Head: Normocephalic and atraumatic.  Cardiovascular: Normal rate.   Respiratory: Effort normal.  Genitourinary:  Vaginal prolapse noted with valsalva. Cystocele felt on exam.   Neurological: She is alert and oriented to person, place, and time.  Skin: Skin is warm and dry. No erythema.  Psychiatric: She has a normal mood and affect.    MAU Course  Procedures None  MDM Patient is in significant discomfort. Discussed with Mayra Neer in [facility] clinic. Will fit patient for pessary as temporary measure for relief until appointment in [facility] clinic for surgical consult. Patient's appointment in clinic has been moved up to 06/25/12 @ 12:45  2 3/4" ring pessary with support placed. Patient does not have discomfort in the vaginal area. Patient able to void. Patient able to ambulate. Patient reports improvement of symptoms. Still having some pressure but not in the vaginal area now.   Assessment and Plan  A: Cystocele  P: Discharge home Rx for Percocet given to patient to be used PRN for pain Patient may leave pessary in place until appointment on 06/25/12 Patient may return to MAU as needed if her condition were to change or worsen  Freddi Starr, PA-C   06/20/2012, 5:34 PM

## 2012-06-20 NOTE — MAU Note (Signed)
Patient was seen in MAU on 2-17 and has an appointment on 3-3 but states she cannot wait. The pain is getting worse and feels like the bladder is falling out.

## 2012-06-22 ENCOUNTER — Other Ambulatory Visit: Payer: Self-pay | Admitting: Advanced Practice Midwife

## 2012-06-22 MED ORDER — OXYCODONE-ACETAMINOPHEN 5-325 MG PO TABS: 1.0000 | ORAL_TABLET | Freq: Four times a day (QID) | ORAL | Status: DC | PRN

## 2012-06-22 NOTE — Progress Notes (Signed)
Pt called to report pain from cystocele continues (pessary placed in MAU on 06/20/12) and she is out of pain medication.  She has appointment with [facility] on Monday to discuss treatment options.  Percocet x15 tabs sent to pt pharmacy at this time.

## 2012-06-22 NOTE — Progress Notes (Signed)
Pt unable to pick up printed prescription for Percocet.  Lortab prescription called to pt pharmacy for 20 tabs only.  Pt has f/u appointment on Monday.

## 2012-06-25 ENCOUNTER — Encounter: Payer: Self-pay | Admitting: Obstetrics & Gynecology

## 2012-06-25 ENCOUNTER — Encounter: Payer: Self-pay | Admitting: *Deleted

## 2012-06-25 ENCOUNTER — Ambulatory Visit (INDEPENDENT_AMBULATORY_CARE_PROVIDER_SITE_OTHER): Payer: Self-pay | Admitting: Obstetrics & Gynecology

## 2012-06-25 VITALS — BP 124/90 | HR 105 | Temp 97.2°F | Ht 64.0 in | Wt 139.4 lb

## 2012-06-25 DIAGNOSIS — IMO0002 Reserved for concepts with insufficient information to code with codable children: Secondary | ICD-10-CM

## 2012-06-25 DIAGNOSIS — N8111 Cystocele, midline: Secondary | ICD-10-CM

## 2012-06-25 DIAGNOSIS — N94819 Vulvodynia, unspecified: Secondary | ICD-10-CM

## 2012-06-25 MED ORDER — OXYCODONE-ACETAMINOPHEN 5-325 MG PO TABS: 1.0000 | ORAL_TABLET | Freq: Four times a day (QID) | ORAL | Status: DC | PRN

## 2012-06-25 NOTE — Progress Notes (Signed)
Subjective:     Patient ID: Crystal [name], female   DOB: Jun 06, 1975, 37 y.o.   MRN: [id number]  HPI 36 you G1P1 Pt was seen in the [facility]U for pelvic pain and had a pessary placed for a cystocele.  The pt c/o of 8 years of pelvic pain and urinary frequency.  Her partner rec that she voids 30x/day.  She also c/o dyspareunia. Lower pelvic pain not relieved with any OTC meds.  She had an SVD 17 years prev.  Did not labor for a prolonged period of time and      Past Medical History  Diagnosis Date  . Allergy   . Status post chemotherapy     docetaxel/carboplatin/trastuzumab  . Anxiety   . Seizures     last seizure 2 years ago, diagnosed 3 years ago  . Hx MRSA infection 03/28/11    Skin  . Right breast ca dx'd 10/2010  . Breast cancer 12/03/10   Past Surgical History  Procedure Laterality Date  . Wisdom tooth extraction    . Breast surgery  11/16/2010    bilateral mastectomy+Ralnd,T1cN1a, Her2+,ERPR+  . Biopsy breast  10/14/2010    right needle core biopsy  . Portacath placement  11/16/2010    placement of left subclavian port  . I&d extremity  09/26/2011    Procedure: IRRIGATION AND DEBRIDEMENT EXTREMITY;  Surgeon: [name] Ribas, MD;  Location: [facility];  Service: Orthopedics;  Laterality: Right;  I&D right hand cat bite wound   Allergies  Allergen Reactions  . Tramadol Other (See Comments)    Seizures  . Phenytoin Nausea And Vomiting  . Thorazine (Chlorpromazine Hcl) Hives  . Ciprofloxacin Nausea And Vomiting  . Vancomycin Rash    Possible rash reported by MD   Current Outpatient Prescriptions on File Prior to Visit  Medication Sig Dispense Refill  . ALPRAZolam (XANAX) 0.5 MG tablet Take 0.5 mg by mouth 3 (three) times daily as needed for anxiety.      . citalopram (CELEXA) 20 MG tablet Take 40 mg by mouth every evening.       Marland Kitchen goserelin (ZOLADEX) 10.8 MG injection Inject 10.8 mg into the skin every 3 (three) months.      Marland Kitchen ibuprofen (ADVIL,MOTRIN) 200 MG tablet Take 200 mg by mouth  every 6 (six) hours as needed for pain, fever or headache.      . promethazine (PHENERGAN) 25 MG tablet Take 25 mg by mouth. Take 1 tablet before meals and at bed time for nausea      . ranitidine (ZANTAC) 150 MG tablet Take 150 mg by mouth 2 (two) times daily as needed for heartburn.      . tamoxifen (NOLVADEX) 20 MG tablet Take 20 mg by mouth every evening.      . carvedilol (COREG) 6.25 MG tablet Take 6.25 mg by mouth daily as needed. For heart palpitations (rapid heart beat)      . DM-Phenylephrine-Acetaminophen (ALKA-SELTZER PLS SINUS & COUGH PO) Take 2 tablets by mouth 3 (three) times daily as needed (for sinus pressure).       No current facility-administered medications on file prior to visit.   History   Social History  . Marital Status: Married    Spouse Name: N/A    Number of Children: N/A  . Years of Education: N/A   Occupational History  . Not on file.   Social History Main Topics  . Smoking status: Current Some Day Smoker -- 2.00 packs/day for 20  years    Types: Cigarettes  . Smokeless tobacco: Never Used  . Alcohol Use: Yes     Comment: occa  . Drug Use: No  . Sexually Active: Yes -- Female partner(s)    Birth Control/ Protection: None     Comment: "zylodex" to "shut down ovaries"   Other Topics Concern  . Not on file   Social History Narrative   Married 4 years   1 son age 31   Unemployed      Grandfather died unknown malignacy   No family h/o breast, ovarian or colon ca      Menarche age 22               Family History  Problem Relation Age of Onset  . Cancer Maternal Grandfather      Review of Systems     Objective:   Physical Exam BP 124/90  Pulse 105  Temp(Src) 97.2 F (36.2 C) (Oral)  Ht 5\' 4"  (1.626 m)  Wt 139 lb 6.4 oz (63.231 kg)  BMI 23.92 kg/m2 Pt in NAD GU: EGBUS: grade II cystocele.  Had pt stand and valsalva.  Pessary in place- removed (no change noted pre vs post pessary)  Vagina:  No abnormal discharge. Uterus: no  prolapse noted; no lasses palpated        Assessment:     Grade II cystocele  Urinary sx of unclear etiology- sx not consistent with small cystocele   Plan:     Percocet #20 prn Referral for urodynamic testing May wear pessary if she feels that it is helpful

## 2012-06-25 NOTE — Patient Instructions (Addendum)
Prolapse  Prolapse means the falling down, bulging, dropping, or drooping of a body part. Organs that commonly prolapse include the rectum, small intestine, bladder, urethra, vagina (birth canal), uterus (womb), and cervix. Prolapse occurs when the ligaments and muscle tissue around the rectum, bladder, and uterus are damaged or weakened.  CAUSES  This happens especially with:  Childbirth. Some women feel pelvic pressure or have trouble holding their urine right after childbirth, because of stretching and tearing of pelvic tissues. This generally gets better with time and the feeling usually goes away, but it may return with aging.  Chronic heavy lifting.  Aging.  Menopause, with loss of estrogen production weakening the pelvic ligaments and muscles.  Past pelvic surgery.  Obesity.  Chronic constipation.  Chronic cough. Prolapse may affect a single organ, or several organs may prolapse at the same time. The front wall of the vagina holds up the bladder. The back wall holds up part of the lower intestine, or rectum. The uterus fills a spot in the middle. All these organs can be involved when the ligaments and muscles around the vagina relax too much. This often gets worse when women stop producing estrogen (menopause). SYMPTOMS  Uncontrolled loss of urine (incontinence) with cough, sneeze, straining, and exercise.  More force may be required to have a bowel movement, due to trapping of the stool.  When part of an organ bulges through the opening of the vagina, there is sometimes a feeling of heaviness or pressure. It may feel as though something is falling out. This sensation increases with coughing or bearing down.  If the organs protrude through the opening of the vagina and rub against the clothing, there may be soreness, ulcers, infection, pain, and bleeding.  Lower back pain.  Pushing in the upper or lower part of the vagina, to pass urine or have a bowel movement.  Problems  having sexual intercourse.  Being unable to insert a tampon or applicator. DIAGNOSIS  Usually, a physical exam is all that is needed to identify the problem. During the examination, you may be asked to cough and strain while lying down, sitting up, and standing up. Your caregiver will determine if more testing is required, such as bladder function tests. Some diagnoses are:  Cystocele: Bulging and falling of the bladder into the top of the vagina.  Rectocele: Part of the rectum bulging into the vagina.  Prolapse of the uterus: The uterus falls or drops into the vagina.  Enterocele: Bulging of the top of the vagina, after a hysterectomy (uterus removal), with the small intestine bulging into the vagina. A hernia in the top of the vagina.  Urethrocele: The urethra (urine carrying tube) bulging into the vagina. TREATMENT  In most cases, prolapse needs to be treated only if it produces symptoms. If the symptoms are interfering with your usual daily or sexual activities, treatment may be necessary. The following are some measures that may be used to treat prolapse.  Estrogen may help elderly women with mild prolapse.  Kegel exercises may help mild cases of prolapse, by strengthening and tightening the muscles of the pelvic floor.  Pessaries are used in women who choose not to, or are unable to, have surgery. A pessary is a doughnut-shaped piece of plastic or rubber that is put into the vagina to keep the organs in place. This device must be fitted by your caregiver. Your caregiver will also explain how to care for yourself with the pessary. If it works well for you,   to keep the organs in place. This device must be fitted by your caregiver. Your caregiver will also explain how to care for yourself with the pessary. If it works well for you, this may be the only treatment required.   Surgery is often the only form of treatment for more severe prolapses. There are different types of surgery available. You should discuss what the best procedure is for you. If the uterus is prolapsed, it may be removed (hysterectomy) as part of the surgical treatment. Your caregiver will  discuss the risks and benefits with you.   Uterine-vaginal suspension (surgery to hold up the organs) may be used, especially if you want to maintain your fertility.  No form of treatment is guaranteed to correct the prolapse or relieve the symptoms.  HOME CARE INSTRUCTIONS    Wear a sanitary pad or absorbent product if you have incontinence of urine.   Avoid heavy lifting and straining with exercise and work.   Take over-the-counter pain medicine for minor discomfort.   Try taking estrogen or using estrogen vaginal cream.   Try Kegel exercises or use a pessary, before deciding to have surgery.   Do Kegel exercises after having a baby.  SEEK MEDICAL CARE IF:    Your symptoms interfere with your daily activities.   You need medicine to help with the discomfort.   You need to be fitted with a pessary.   You notice bleeding from the vagina.   You think you have ulcers or you notice ulcers on the cervix.   You have an oral temperature above 102 F (38.9 C).   You develop pain or blood with urination.   You have bleeding with a bowel movement.   The symptoms are interfering with your sex life.   You have urinary incontinence that interferes with your daily activities.   You lose urine with sexual intercourse.   You have a chronic cough.   You have chronic constipation.  Document Released: 10/23/2002 Document Revised: 07/11/2011 Document Reviewed: 05/03/2009  [employer] Patient Information 2013 [employer].

## 2012-06-27 ENCOUNTER — Telehealth: Payer: Self-pay | Admitting: *Deleted

## 2012-06-27 NOTE — Telephone Encounter (Signed)
Message copied by Gerome Apley on Wed Jun 27, 2012  5:02 PM ------      Message from: Mayra Neer P      Created: Mon Jun 25, 2012  4:06 PM      Regarding: Please schedule       Please schedule a referral to Urology for this patient.  I have taken care of the financial application.  Just needs her urology appointment.      Thanks,      Tresa Endo      ----- Message -----         From: Willodean Rosenthal, MD         Sent: 06/25/2012   2:04 PM           To: Juliette Mangle, RN, Allie Bossier, MD            Tresa Endo this pt needs Urodynamic testing.  She will complete her financial aid paperwork.            Dr. Marice Potter,      Please review this chart.  This pt received a pessary in the MAU.  I am not impressed by her cystocele.  She has multiple urinary complaints..not sure where to go after urodynamic testing....have sent her to you for a consult.            clh-S              ------

## 2012-06-28 ENCOUNTER — Emergency Department (HOSPITAL_COMMUNITY)
Admission: EM | Admit: 2012-06-28 | Discharge: 2012-06-28 | Disposition: A | Discharge status: Home / Self Care | Payer: Self-pay | Attending: Emergency Medicine | Admitting: Emergency Medicine

## 2012-06-28 ENCOUNTER — Encounter (HOSPITAL_COMMUNITY): Payer: Self-pay | Admitting: *Deleted

## 2012-06-28 ENCOUNTER — Telehealth: Payer: Self-pay | Admitting: *Deleted

## 2012-06-28 DIAGNOSIS — Z8669 Personal history of other diseases of the nervous system and sense organs: Secondary | ICD-10-CM | POA: Insufficient documentation

## 2012-06-28 DIAGNOSIS — Z79899 Other long term (current) drug therapy: Secondary | ICD-10-CM | POA: Insufficient documentation

## 2012-06-28 DIAGNOSIS — R300 Dysuria: Secondary | ICD-10-CM | POA: Insufficient documentation

## 2012-06-28 DIAGNOSIS — Z853 Personal history of malignant neoplasm of breast: Secondary | ICD-10-CM | POA: Insufficient documentation

## 2012-06-28 DIAGNOSIS — Z5189 Encounter for other specified aftercare: Secondary | ICD-10-CM | POA: Insufficient documentation

## 2012-06-28 DIAGNOSIS — R350 Frequency of micturition: Secondary | ICD-10-CM | POA: Insufficient documentation

## 2012-06-28 DIAGNOSIS — K5900 Constipation, unspecified: Secondary | ICD-10-CM | POA: Insufficient documentation

## 2012-06-28 DIAGNOSIS — Z8614 Personal history of Methicillin resistant Staphylococcus aureus infection: Secondary | ICD-10-CM | POA: Insufficient documentation

## 2012-06-28 DIAGNOSIS — N8111 Cystocele, midline: Secondary | ICD-10-CM | POA: Insufficient documentation

## 2012-06-28 DIAGNOSIS — F17200 Nicotine dependence, unspecified, uncomplicated: Secondary | ICD-10-CM | POA: Insufficient documentation

## 2012-06-28 DIAGNOSIS — IMO0002 Reserved for concepts with insufficient information to code with codable children: Secondary | ICD-10-CM

## 2012-06-28 DIAGNOSIS — R5381 Other malaise: Secondary | ICD-10-CM | POA: Insufficient documentation

## 2012-06-28 DIAGNOSIS — R5383 Other fatigue: Secondary | ICD-10-CM | POA: Insufficient documentation

## 2012-06-28 DIAGNOSIS — F411 Generalized anxiety disorder: Secondary | ICD-10-CM | POA: Insufficient documentation

## 2012-06-28 LAB — URINALYSIS, ROUTINE W REFLEX MICROSCOPIC
Bilirubin Urine: NEGATIVE
Glucose, UA: NEGATIVE mg/dL
Hgb urine dipstick: NEGATIVE
Ketones, ur: NEGATIVE mg/dL
Leukocytes, UA: NEGATIVE
Nitrite: NEGATIVE
Protein, ur: NEGATIVE mg/dL
Specific Gravity, Urine: 1.012 (ref 1.005–1.030)
Urobilinogen, UA: 0.2 mg/dL (ref 0.0–1.0)
pH: 5 (ref 5.0–8.0)

## 2012-06-28 MED ORDER — OXYCODONE-ACETAMINOPHEN 5-325 MG PO TABS
1.0000 | ORAL_TABLET | Freq: Once | ORAL | Status: AC
  Administered 2012-06-28: 1 via ORAL
  Filled 2012-06-28: qty 1

## 2012-06-28 MED ORDER — OXYCODONE-ACETAMINOPHEN 5-325 MG PO TABS: 1.0000 | ORAL_TABLET | Freq: Four times a day (QID) | ORAL | Status: DC | PRN

## 2012-06-28 NOTE — Telephone Encounter (Signed)
Called Crystal [name] and informed her of doctor reccommendation that she is referred to a urologist- patient given choice of urologist and she chose [facility].  Called [facility] and given appointment for 07/16/12 at 0830 with Dr. McDiarmid. Called patient and gave her appointment and notified her needs to take $250 to appointment and make payment plan for remainder of bill at visit, as well as picture ID.

## 2012-06-28 NOTE — ED Notes (Signed)
Right Arm Restricted due to Breast Cancer

## 2012-06-28 NOTE — ED Notes (Signed)
Pt seen at [facility] on 2/17; diagnosed with prolapsed bladder-sent to [facility] clinic and referred to urologist; appt on 3/17; pt c/o severe pelvic pain; pressure in lower abd and pelvic region; straining to urinate for years--new symptoms

## 2012-06-28 NOTE — Telephone Encounter (Signed)
Need to send referral to [facility]

## 2012-06-28 NOTE — ED Provider Notes (Signed)
History     CSN: [id number]  Arrival date & time 06/28/12  1931   First MD Initiated Contact with Patient 06/28/12 2010      No chief complaint on file.   HPI Pt is a 37 yo F with PMH of breast cancer s/p chemo presenting for re-evaluation of bladder prolapse. Pt has been evaluated in the MAU on 2/17 and 2/19 as well as in [facility] on 2/24 for her bladder prolapse. She was fitted for a pessary which she states she could not tolerate. She has been taking the Percocet which helped some but she does not have any more at home. She states she urinates "at least 40 times" per day, and her pain is worse if she is sitting up or standing. If she lies flat the pain is relieved. She gets up 1-2 times at night to urinate. Her pain is constant and she states it is an internal pain that goes up to her lower abdomen. Some pain with intercourse. She states nothing changed today, except the pain is persistent and she is out of pain medication. Her husband is concerned that something is being missed given her pain and urinary difficulties.  On further review, she states she has been straining to urinate since 2008, and the pain has been getting progressively worse. She had a normal vaginal delivery 17 years ago with an episiotomy with no complications post-partum. She has some intermittent constipation, last BM today. No stool in vagina.   Past Medical History  Diagnosis Date  . Allergy   . Status post chemotherapy     docetaxel/carboplatin/trastuzumab  . Anxiety   . Seizures     last seizure 2 years ago, diagnosed 3 years ago  . Hx MRSA infection 03/28/11    Skin  . Right breast ca dx'd 10/2010  . Breast cancer 12/03/10    Past Surgical History  Procedure Laterality Date  . Wisdom tooth extraction    . Breast surgery  11/16/2010    bilateral mastectomy+Ralnd,T1cN1a, Her2+,ERPR+  . Biopsy breast  10/14/2010    right needle core biopsy  . Portacath placement  11/16/2010    placement of left  subclavian port  . I&d extremity  09/26/2011    Procedure: IRRIGATION AND DEBRIDEMENT EXTREMITY;  Surgeon: [name] Ribas, MD;  Location: [facility];  Service: Orthopedics;  Laterality: Right;  I&D right hand cat bite wound    Family History  Problem Relation Age of Onset  . Cancer Maternal Grandfather     History  Substance Use Topics  . Smoking status: Current Some Day Smoker -- 2.00 packs/day for 20 years    Types: Cigarettes  . Smokeless tobacco: Never Used  . Alcohol Use: Yes     Comment: occa    OB History   Grav Para Term Preterm Abortions TAB SAB Ect Mult Living   1 1 1  0 0 0 0 0 0 1      Review of Systems  Constitutional: Positive for fatigue. Negative for fever and chills.  HENT: Negative for congestion.   Respiratory: Negative for cough and shortness of breath.   Cardiovascular: Negative for chest pain.  Gastrointestinal: Positive for abdominal pain and constipation.  Genitourinary: Positive for frequency and difficulty urinating.  Skin: Negative for rash.  All other systems reviewed and are negative.    Allergies  Tramadol; Phenytoin; Thorazine; Ciprofloxacin; and Vancomycin  Home Medications   Current Outpatient Rx  Name  Route  Sig  Dispense  Refill  . ALPRAZolam (XANAX) 0.5 MG tablet   Oral   Take 0.5 mg by mouth 3 (three) times daily as needed for anxiety.         . carvedilol (COREG) 6.25 MG tablet   Oral   Take 6.25 mg by mouth daily as needed. For heart palpitations (rapid heart beat)         . citalopram (CELEXA) 20 MG tablet   Oral   Take 40 mg by mouth every evening.          Marland Kitchen DM-Phenylephrine-Acetaminophen (ALKA-SELTZER PLS SINUS & COUGH PO)   Oral   Take 2 tablets by mouth 3 (three) times daily as needed (for sinus pressure).         Marland Kitchen goserelin (ZOLADEX) 10.8 MG injection   Subcutaneous   Inject 10.8 mg into the skin every 3 (three) months.         Marland Kitchen ibuprofen (ADVIL,MOTRIN) 200 MG tablet   Oral   Take 200 mg by mouth  every 6 (six) hours as needed for pain, fever or headache.         . ranitidine (ZANTAC) 150 MG tablet   Oral   Take 150 mg by mouth 2 (two) times daily as needed for heartburn.         . tamoxifen (NOLVADEX) 20 MG tablet   Oral   Take 20 mg by mouth every evening.         Marland Kitchen oxyCODONE-acetaminophen (PERCOCET/ROXICET) 5-325 MG per tablet   Oral   Take 1-2 tablets by mouth every 6 (six) hours as needed for pain.   20 tablet   0     BP 135/82  Pulse 85  Temp(Src) 98.7 F (37.1 C) (Oral)  Resp 16  Ht 5\' 4"  (1.626 m)  Wt 139 lb (63.05 kg)  BMI 23.85 kg/m2  SpO2 98%  Physical Exam  Constitutional: She appears well-developed and well-nourished. No distress.  HENT:  Head: Normocephalic and atraumatic.  Mouth/Throat: Oropharynx is clear and moist.  Neck: Normal range of motion. Neck supple.  Genitourinary: Rectum normal and vagina normal. Rectal exam shows anal tone normal. Cervix exhibits no motion tenderness and no discharge. Right adnexum displays no tenderness. Left adnexum displays no tenderness.  Bladder noted to be in vaginal vault without signs of irritation, bleeding or friability. Urethral meatus wnl  Lymphadenopathy:    She has no cervical adenopathy.  Skin: She is not diaphoretic.    ED Course  Procedures (including critical care time)  Labs Reviewed  URINALYSIS, ROUTINE W REFLEX MICROSCOPIC   No results found.  1. Cystocele     MDM  37 yo F with cystocele, presenting for re-evaluation  Will check UA, but it appears this has been unremarkable with recent evaluation. Will do pelvic exam today with speculum. At this point, imaging is not necessary since history and exam are most consistent with cystocele. Will give Percocet for pain.   2136- Pelvic exam remarkable for moderate (Stage 2) cystocele with bearing down. Patient will likely greatly benefit from Urology evaluation for bladder function studies since I feel like her frequency and pain are most  concerning to her. Will treat with Percocet #20. She should follow up with her PCP as needed, and keep appt previously scheduled with urology. Pt can attempt to call [facility] to see if she can get in any sooner or be placed on a cancellation list. Continue doing Kegel exercises.    Hilarie Fredrickson, MD  06/28/12 2141 

## 2012-06-30 NOTE — ED Provider Notes (Signed)
Medical screening examination/treatment/procedure(s) were conducted as a shared visit with non-physician practitioner(s) and myself.  I personally evaluated the patient during the encounter.  36yF with bladder prolapse. Evaluated multiple times for same at [facility] and referred to urology. On exam she is in NAD. Abdomen non-tender.   Pelvic exam preformed by Dr Mikel Cella and consistent with cystocele. No CVA tenderness. Will tx pain. Urology FU.   Raeford Razor, MD 06/30/12 0700

## 2012-07-02 ENCOUNTER — Encounter: Payer: Self-pay | Admitting: Obstetrics & Gynecology

## 2012-07-11 ENCOUNTER — Encounter: Payer: Self-pay | Admitting: *Deleted

## 2012-07-12 ENCOUNTER — Encounter (HOSPITAL_COMMUNITY): Payer: Self-pay | Admitting: Emergency Medicine

## 2012-07-12 ENCOUNTER — Emergency Department (HOSPITAL_COMMUNITY): Payer: Self-pay

## 2012-07-12 ENCOUNTER — Emergency Department (HOSPITAL_COMMUNITY)
Admission: EM | Admit: 2012-07-12 | Discharge: 2012-07-12 | Disposition: A | Discharge status: Home / Self Care | Payer: Self-pay | Attending: Emergency Medicine | Admitting: Emergency Medicine

## 2012-07-12 DIAGNOSIS — M549 Dorsalgia, unspecified: Secondary | ICD-10-CM | POA: Insufficient documentation

## 2012-07-12 DIAGNOSIS — F411 Generalized anxiety disorder: Secondary | ICD-10-CM | POA: Insufficient documentation

## 2012-07-12 DIAGNOSIS — R300 Dysuria: Secondary | ICD-10-CM | POA: Insufficient documentation

## 2012-07-12 DIAGNOSIS — Z5189 Encounter for other specified aftercare: Secondary | ICD-10-CM | POA: Insufficient documentation

## 2012-07-12 DIAGNOSIS — C50919 Malignant neoplasm of unspecified site of unspecified female breast: Secondary | ICD-10-CM | POA: Insufficient documentation

## 2012-07-12 DIAGNOSIS — Z8614 Personal history of Methicillin resistant Staphylococcus aureus infection: Secondary | ICD-10-CM | POA: Insufficient documentation

## 2012-07-12 DIAGNOSIS — Z79899 Other long term (current) drug therapy: Secondary | ICD-10-CM | POA: Insufficient documentation

## 2012-07-12 DIAGNOSIS — R34 Anuria and oliguria: Secondary | ICD-10-CM | POA: Insufficient documentation

## 2012-07-12 DIAGNOSIS — F17200 Nicotine dependence, unspecified, uncomplicated: Secondary | ICD-10-CM | POA: Insufficient documentation

## 2012-07-12 DIAGNOSIS — R102 Pelvic and perineal pain: Secondary | ICD-10-CM

## 2012-07-12 DIAGNOSIS — Z3202 Encounter for pregnancy test, result negative: Secondary | ICD-10-CM | POA: Insufficient documentation

## 2012-07-12 DIAGNOSIS — Z8669 Personal history of other diseases of the nervous system and sense organs: Secondary | ICD-10-CM | POA: Insufficient documentation

## 2012-07-12 DIAGNOSIS — N949 Unspecified condition associated with female genital organs and menstrual cycle: Secondary | ICD-10-CM | POA: Insufficient documentation

## 2012-07-12 LAB — COMPREHENSIVE METABOLIC PANEL
AST: 18 U/L (ref 0–37)
Albumin: 3.8 g/dL (ref 3.5–5.2)
BUN: 14 mg/dL (ref 6–23)
Chloride: 102 mEq/L (ref 96–112)
Creatinine, Ser: 1.02 mg/dL (ref 0.50–1.10)
Total Bilirubin: 0.2 mg/dL — ABNORMAL LOW (ref 0.3–1.2)
Total Protein: 6.9 g/dL (ref 6.0–8.3)

## 2012-07-12 LAB — CBC WITH DIFFERENTIAL/PLATELET
Eosinophils Absolute: 0.2 10*3/uL (ref 0.0–0.7)
Hemoglobin: 13.4 g/dL (ref 12.0–15.0)
Lymphs Abs: 1.5 10*3/uL (ref 0.7–4.0)
MCH: 31.6 pg (ref 26.0–34.0)
Monocytes Relative: 5 % (ref 3–12)
Neutro Abs: 4.5 10*3/uL (ref 1.7–7.7)
Neutrophils Relative %: 69 % (ref 43–77)
RBC: 4.24 MIL/uL (ref 3.87–5.11)

## 2012-07-12 LAB — URINALYSIS, ROUTINE W REFLEX MICROSCOPIC
Bilirubin Urine: NEGATIVE
Nitrite: NEGATIVE
Specific Gravity, Urine: 1.019 (ref 1.005–1.030)
pH: 5 (ref 5.0–8.0)

## 2012-07-12 LAB — POCT PREGNANCY, URINE: Preg Test, Ur: NEGATIVE

## 2012-07-12 MED ORDER — HEPARIN SOD (PORK) LOCK FLUSH 100 UNIT/ML IV SOLN
INTRAVENOUS | Status: AC
  Administered 2012-07-12: 500 [IU]
  Filled 2012-07-12: qty 5

## 2012-07-12 MED ORDER — IOHEXOL 300 MG/ML  SOLN
100.0000 mL | Freq: Once | INTRAMUSCULAR | Status: AC | PRN
  Administered 2012-07-12: 100 mL via INTRAVENOUS

## 2012-07-12 MED ORDER — IOHEXOL 300 MG/ML  SOLN
50.0000 mL | Freq: Once | INTRAMUSCULAR | Status: AC | PRN
  Administered 2012-07-12: 50 mL via ORAL

## 2012-07-12 MED ORDER — HYDROCODONE-ACETAMINOPHEN 5-325 MG PO TABS: 2.0000 | ORAL_TABLET | ORAL | Status: DC | PRN

## 2012-07-12 NOTE — ED Provider Notes (Signed)
History     CSN: [id number]  Arrival date & time 07/12/12  1420   First MD Initiated Contact with Patient 07/12/12 1456      Chief Complaint  Patient presents with  . Abdominal Pain  . Back Pain    (Consider location/radiation/quality/duration/timing/severity/associated sxs/prior treatment) HPI Comments: Patient comes to the ER with complaints of lower abdominal pelvic pain radiating into the back. Patient has been experiencing these symptoms for almost a month. She has been in the emergency department, seen her family doctor, and to OB/GYN and also test was referred to a urologist for these symptoms. Patient reports that she has not had any relief. She has had bladder problems since 2008. She reports having difficulty urinating since that time, frequently having to strain to pass any urine. She has been using ibuprofen, but has not been giving her any relief.  Patient is a 37 y.o. female presenting with abdominal pain and back pain.  Abdominal Pain Associated symptoms: dysuria   Associated symptoms: no fever   Back Pain Associated symptoms: abdominal pain, dysuria and pelvic pain   Associated symptoms: no fever     Past Medical History  Diagnosis Date  . Allergy   . Status post chemotherapy     docetaxel/carboplatin/trastuzumab  . Anxiety   . Seizures     last seizure 2 years ago, diagnosed 3 years ago  . Hx MRSA infection 03/28/11    Skin  . Right breast ca dx'd 10/2010  . Breast cancer 12/03/10    Past Surgical History  Procedure Laterality Date  . Wisdom tooth extraction    . Breast surgery  11/16/2010    bilateral mastectomy+Ralnd,T1cN1a, Her2+,ERPR+  . Biopsy breast  10/14/2010    right needle core biopsy  . Portacath placement  11/16/2010    placement of left subclavian port  . I&d extremity  09/26/2011    Procedure: IRRIGATION AND DEBRIDEMENT EXTREMITY;  Surgeon: [name] Ribas, MD;  Location: [facility];  Service: Orthopedics;  Laterality: Right;  I&D right hand cat  bite wound    Family History  Problem Relation Age of Onset  . Cancer Maternal Grandfather     History  Substance Use Topics  . Smoking status: Current Some Day Smoker -- 2.00 packs/day for 20 years    Types: Cigarettes  . Smokeless tobacco: Never Used  . Alcohol Use: Yes     Comment: occa    OB History   Grav Para Term Preterm Abortions TAB SAB Ect Mult Living   1 1 1  0 0 0 0 0 0 1      Review of Systems  Constitutional: Negative for fever.  Gastrointestinal: Positive for abdominal pain.  Genitourinary: Positive for dysuria, decreased urine volume and pelvic pain.  Musculoskeletal: Positive for back pain.  All other systems reviewed and are negative.    Allergies  Tramadol; Phenytoin; Thorazine; Ciprofloxacin; and Vancomycin  Home Medications   Current Outpatient Rx  Name  Route  Sig  Dispense  Refill  . ALPRAZolam (XANAX) 0.5 MG tablet   Oral   Take 0.5 mg by mouth 3 (three) times daily as needed for anxiety.         . carvedilol (COREG) 6.25 MG tablet   Oral   Take 6.25 mg by mouth daily as needed. For heart palpitations (rapid heart beat)         . citalopram (CELEXA) 20 MG tablet   Oral   Take 40 mg by mouth every morning.          Marland Kitchen  goserelin (ZOLADEX) 10.8 MG injection   Subcutaneous   Inject 10.8 mg into the skin every 3 (three) months.         Marland Kitchen ibuprofen (ADVIL,MOTRIN) 200 MG tablet   Oral   Take 200 mg by mouth every 6 (six) hours as needed for pain, fever or headache.         . oxyCODONE-acetaminophen (PERCOCET/ROXICET) 5-325 MG per tablet   Oral   Take 1 tablet by mouth every 6 (six) hours as needed for pain.         . ranitidine (ZANTAC) 150 MG tablet   Oral   Take 150 mg by mouth 2 (two) times daily as needed for heartburn.         . tamoxifen (NOLVADEX) 20 MG tablet   Oral   Take 20 mg by mouth every evening.           BP 129/89  Pulse 93  Temp(Src) 98.8 F (37.1 C) (Oral)  Resp 15  SpO2 98%  Physical Exam   Constitutional: She is oriented to person, place, and time. She appears well-developed and well-nourished. No distress.  HENT:  Head: Normocephalic and atraumatic.  Right Ear: Hearing normal.  Nose: Nose normal.  Mouth/Throat: Oropharynx is clear and moist and mucous membranes are normal.  Eyes: Conjunctivae and EOM are normal. Pupils are equal, round, and reactive to light.  Neck: Normal range of motion. Neck supple.  Cardiovascular: Normal rate, regular rhythm, S1 normal and S2 normal.  Exam reveals no gallop and no friction rub.   No murmur heard. Pulmonary/Chest: Effort normal and breath sounds normal. No respiratory distress. She exhibits no tenderness.  Abdominal: Soft. Normal appearance and bowel sounds are normal. There is no hepatosplenomegaly. There is no tenderness. There is no rebound, no guarding, no tenderness at McBurney's point and negative Murphy's sign. No hernia.  Musculoskeletal: Normal range of motion.  Neurological: She is alert and oriented to person, place, and time. She has normal strength. No cranial nerve deficit or sensory deficit. Coordination normal. GCS eye subscore is 4. GCS verbal subscore is 5. GCS motor subscore is 6.  Skin: Skin is warm, dry and intact. No rash noted. No cyanosis.  Psychiatric: She has a normal mood and affect. Her speech is normal and behavior is normal. Thought content normal.    ED Course  Procedures (including critical care time)  Labs Reviewed  URINALYSIS, ROUTINE W REFLEX MICROSCOPIC - Abnormal; Notable for the following:    APPearance CLOUDY (*)    All other components within normal limits  COMPREHENSIVE METABOLIC PANEL - Abnormal; Notable for the following:    Total Bilirubin 0.2 (*)    GFR calc non Af Amer 71 (*)    GFR calc Af Amer 82 (*)    All other components within normal limits  CBC WITH DIFFERENTIAL  POCT PREGNANCY, URINE   No results found.    Diagnosis: Pelvic Pain, Ovarian Cyst   MDM  Patient presented  with complaints of low back pain and pelvic pain for several weeks. Patient has been seen by multiple physicians, including OB/GYN and neurology. She is continued to complain of vague lower back pain, pelvic pain without other associated symptoms. Patient is convinced that she has recurrent cancer. A CAT scan was performed and shows only a small ovarian cyst, no abnormalities otherwise. Patient was reassured, followup with her urologist.  Gilda Crease, MD 07/15/12 [phone]

## 2012-07-12 NOTE — ED Notes (Signed)
Pt has breast ca and states that she has has lower back pain and lower abd pain for approx 3 weeks now and she is concerned due to she has breast ca and they have not scanned her for anything elese they did they say she had a prolapse bladder.

## 2012-07-12 NOTE — ED Notes (Signed)
Pt states she's a breast ca pt, was in lymph nodes, states she's been having lower back pain radiating around to lower abdominal area, mainly R side, states she's been told before she has ovarian cyst, states she also has a prolapsed bladder, saw the urologist last week about pain but was told nothing, states pain is getting worse and she's scared ca may have spread.

## 2012-07-13 ENCOUNTER — Other Ambulatory Visit: Payer: Self-pay | Admitting: Urology

## 2012-07-16 ENCOUNTER — Telehealth: Payer: Self-pay

## 2012-07-16 ENCOUNTER — Encounter (HOSPITAL_BASED_OUTPATIENT_CLINIC_OR_DEPARTMENT_OTHER): Payer: Self-pay | Admitting: *Deleted

## 2012-07-16 NOTE — Telephone Encounter (Signed)
Patient called and stated she has an upcoming appointment for a follow up on 07/18/12.  She was referred to an urologist and has scheduled a procedure with him for 07/23/12.  Her question is if she should reschedule her follow up appointment in the clinic until after the urology procedure.

## 2012-07-16 NOTE — Telephone Encounter (Signed)
Discussed patient question with Dr. Debroah Loop and instructed she should reschedule her appointment with [place] for after her procedure with urology- as that may help much of her problems.  Called pateint and left message we got her message and are calling back to discuss with her- please call clinic and if we may leave detailed information on your voice mail please leave a message indicating that.

## 2012-07-17 ENCOUNTER — Encounter (HOSPITAL_BASED_OUTPATIENT_CLINIC_OR_DEPARTMENT_OTHER): Payer: Self-pay | Admitting: *Deleted

## 2012-07-17 MED ORDER — PHENAZOPYRIDINE HCL 200 MG PO TABS: Freq: Once | ORAL | Status: DC

## 2012-07-17 NOTE — Telephone Encounter (Signed)
Duplicate encounter

## 2012-07-17 NOTE — Progress Notes (Signed)
NPO AFTER MN. ARRIVES AT 0615. NEEDS HG. LAST LAB WORK DONE ON 07-12-2012. CURRENT EKG IN EPIC AND CHART. WILL TAKE ZANTAC AM OF SURG W/ SIP OF WATER.

## 2012-07-17 NOTE — Telephone Encounter (Signed)
Pt called back and spoke with [facility]. She wanted to clarify the message that was left on her voice mail. Pt states that she will call us to reschedule Gyn appt after her procedure on 07/23/12 with Dr. Perley Jain.

## 2012-07-18 ENCOUNTER — Encounter: Payer: Self-pay | Admitting: Obstetrics & Gynecology

## 2012-07-19 ENCOUNTER — Ambulatory Visit (HOSPITAL_BASED_OUTPATIENT_CLINIC_OR_DEPARTMENT_OTHER): Payer: Self-pay

## 2012-07-19 ENCOUNTER — Telehealth: Payer: Self-pay | Admitting: Oncology

## 2012-07-19 ENCOUNTER — Other Ambulatory Visit: Payer: Self-pay | Admitting: *Deleted

## 2012-07-19 ENCOUNTER — Encounter: Payer: Self-pay | Admitting: Physician Assistant

## 2012-07-19 ENCOUNTER — Ambulatory Visit (HOSPITAL_BASED_OUTPATIENT_CLINIC_OR_DEPARTMENT_OTHER): Payer: Self-pay | Admitting: Physician Assistant

## 2012-07-19 ENCOUNTER — Telehealth: Payer: Self-pay | Admitting: *Deleted

## 2012-07-19 ENCOUNTER — Other Ambulatory Visit (HOSPITAL_BASED_OUTPATIENT_CLINIC_OR_DEPARTMENT_OTHER): Payer: Self-pay | Admitting: Lab

## 2012-07-19 VITALS — BP 127/87 | HR 103 | Temp 98.2°F | Resp 20 | Ht 64.0 in | Wt 139.9 lb

## 2012-07-19 DIAGNOSIS — C50911 Malignant neoplasm of unspecified site of right female breast: Secondary | ICD-10-CM

## 2012-07-19 DIAGNOSIS — C50919 Malignant neoplasm of unspecified site of unspecified female breast: Secondary | ICD-10-CM

## 2012-07-19 DIAGNOSIS — IMO0002 Reserved for concepts with insufficient information to code with codable children: Secondary | ICD-10-CM

## 2012-07-19 DIAGNOSIS — Z170 Estrogen receptor positive status [ER+]: Secondary | ICD-10-CM

## 2012-07-19 DIAGNOSIS — F329 Major depressive disorder, single episode, unspecified: Secondary | ICD-10-CM

## 2012-07-19 DIAGNOSIS — F32A Depression, unspecified: Secondary | ICD-10-CM

## 2012-07-19 DIAGNOSIS — M25559 Pain in unspecified hip: Secondary | ICD-10-CM

## 2012-07-19 LAB — CBC WITH DIFFERENTIAL/PLATELET
BASO%: 0.4 % (ref 0.0–2.0)
Basophils Absolute: 0 10*3/uL (ref 0.0–0.1)
Eosinophils Absolute: 0.2 10*3/uL (ref 0.0–0.5)
HCT: 42 % (ref 34.8–46.6)
HGB: 15 g/dL (ref 11.6–15.9)
LYMPH%: 24.5 % (ref 14.0–49.7)
MCHC: 35.7 g/dL (ref 31.5–36.0)
MONO#: 0.6 10*3/uL (ref 0.1–0.9)
NEUT#: 7.1 10*3/uL — ABNORMAL HIGH (ref 1.5–6.5)
NEUT%: 67 % (ref 38.4–76.8)
Platelets: 197 10*3/uL (ref 145–400)
WBC: 10.5 10*3/uL — ABNORMAL HIGH (ref 3.9–10.3)
lymph#: 2.6 10*3/uL (ref 0.9–3.3)

## 2012-07-19 LAB — COMPREHENSIVE METABOLIC PANEL (CC13)
AST: 22 U/L (ref 5–34)
Albumin: 4.3 g/dL (ref 3.5–5.0)
BUN: 14.5 mg/dL (ref 7.0–26.0)
CO2: 26 mEq/L (ref 22–29)
Calcium: 9.6 mg/dL (ref 8.4–10.4)
Chloride: 102 mEq/L (ref 98–107)
Creatinine: 1 mg/dL (ref 0.6–1.1)
Potassium: 3.7 mEq/L (ref 3.5–5.1)

## 2012-07-19 MED ORDER — GABAPENTIN 600 MG PO TABS: 1200.0000 mg | ORAL_TABLET | Freq: Three times a day (TID) | ORAL | Status: DC

## 2012-07-19 MED ORDER — HYDROCODONE-ACETAMINOPHEN 5-325 MG PO TABS: 1.0000 | ORAL_TABLET | Freq: Three times a day (TID) | ORAL | Status: DC | PRN

## 2012-07-19 MED ORDER — GOSERELIN ACETATE 10.8 MG ~~LOC~~ IMPL
10.8000 mg | DRUG_IMPLANT | SUBCUTANEOUS | Status: DC
  Administered 2012-07-19: 10.8 mg via SUBCUTANEOUS
  Filled 2012-07-19: qty 10.8

## 2012-07-19 NOTE — Progress Notes (Signed)
ID: [name]   DOB: 1975-08-14  MR#: [id number]  WUJ#:[id number]  NFA:OZHYQMV,HQIONGE, MD GYN: SURoyston Sinner, MD OTHER:  [name], MD    HISTORY OF PRESENT ILLNESS: The patient is a 37 year old [place] woman who noted a mass in her right breast and brought it to her primary care physician's attention June of 2012. She was set up for mammography at [facility], where an area of calcifications highly suspicious for carcinoma was biopsied. This was in the upper outer quadrant and it measured 1.7 cm mammographically and 1.8 cm by ultrasonography. The pathology report ([id number]) showed a high-grade invasive ductal carcinoma estrogen receptor positive at 64% progesterone receptor positive at 18%, and HER-2 amplified with a ratio by CISH of 5.24. MIB-1-1 was 65%. A second mass biopsied at the same time was also triple positive.  Given her multiple masses in the right breast, mastectomy was recommended. The patient actually opted for bilateral mastectomies and this was performed together with a right axillary lymph node dissection under Dr. Felicity Pellegrini on 11/16/2010. The final pathology [id number]) showed, on the left, no evidence of cancer. On the right the patient had a high-grade invasive ductal carcinoma measuring 1.9 cm, with negative margins, grade 3, involving 2 of 16 lymph nodes (stage IIB).  Her subsequent history is as detailed below.  INTERVAL HISTORY:  Crystal [name] returns today accompanied by her husband Crystal [name] for followup of her right breast carcinoma. She completed her Herceptin treatments in December. She is continuing with tamoxifen daily, and also receives Zoladex injections every 3 months (due again today). She chose to keep her port for now, and is having her port flush every 6 weeks as well.  Interval history is remarkable for Crystal [name] having been seen in the emergency room approximately one week ago for pain attributed to a cystocele. She is being followed by a urologist, and is  having an exploratory procedure next week for further evaluation. She having quite a bit of pain and discomfort from the cystocele which is a grade 2. She has been using gabapentin, 1200 mg by mouth 3 times a day which helps quite a bit with the pain. She still has some discomfort, and gets relief from hydrocodone/APAP which she requests be prescribed.  We had previously requested a referral to psychiatry, but she has not yet heard from anyone to make this appointment. We will investigate today.   REVIEW OF SYSTEMS: Physically, Crystal [name]'s biggest complaint other than pelvic pain is fatigue. She has no energy. She's had no fevers or chills. She has occasional hot flashes. She's had no vaginal bleeding but does have some vaginal dryness. She has occasional nausea but no emesis. She has chronic issues with constipation, but this has not worsened or changed. She's had no cough, shortness of breath, or chest pain. She has occasional headaches, nothing that she considers abnormal, and she denies any dizziness or change in vision. She currently denies any myalgias, arthralgias, bony pain, or peripheral swelling.  A detailed review of systems is otherwise stable and noncontributory.   PAST MEDICAL HISTORY: Past Medical History  Diagnosis Date  . Status post chemotherapy     docetaxel/carboplatin/trastuzumab  . Anxiety   . Hx MRSA infection 03/28/11    Skin  . History of breast cancer DX JUNE 2012 W/ RIGHT BREAST INVASIVE DUCTAL CA  STAGE IIB---  S/P BILATERAL MASTECTOMIES AND RIGHT NODE DISSECTION    ONCOLOGIST-- DR [name]--  CHEMO ENDED DEC 2012 / RADIATION ENDED MAR 2013--  CURRENT ON TAMOXIFEN AND ZOLADEX  . Family history of anesthesia complication     MOTHER PONV  . Hot flashes due to tamoxifen   . History of idiopathic seizure     DX 2008 --  LAST ONE 2012--  NO ISSUES SINCE AND NO MEDS. -- PT STATES DOCTORS FELT IT WAS STRESS RELATED DUE TO HEALTH ISSUES  . Bipolar affective disorder, mixed    . GERD (gastroesophageal reflux disease)     OCCASIONALLY TAKE ZANTAC  . Left ovarian cyst   . Right lower quadrant abdominal pain   . Frequency of urination   . Urgency of urination   . Strains to urinate   . Nocturia   . Heart palpitations   1. History of seizures. The patient has been thoroughly evaluated both here by Dr. [name] and at [facility] for this. Among many studies, she had an MRI of the brain on August 28th. This showed an incidental solitary small lesion in the left frontoparietal white matter. It was not felt to be suggestive of multiple sclerosis. A noncontrasted CT in August 2010 was negative. The patient has been off all seizure medications now for 2 months. There has been no evidence of seizure recurrence. 2. Complex psychology history (please refer to Dr. [name] note in the E-chart from August 29, 2009) with evidence of anxiety disorder, depression and psychosis. The patient is currently on no medications for this and denies any of these symptoms at present. 3. History of migraines. 4. Remote history of multidrug abuse. 5. Status post wisdom teeth extraction.  History of MRSA skin infection   PAST SURGICAL HISTORY: Past Surgical History  Procedure Laterality Date  . Wisdom tooth extraction    . Biopsy breast  10/14/2010    right needle core biopsy  . Portacath placement  11/16/2010    placement of left subclavian port  . I&d extremity  09/26/2011    Procedure: IRRIGATION AND DEBRIDEMENT EXTREMITY;  Surgeon: [name] Ribas, MD;  Location: [facility];  Service: Orthopedics;  Laterality: Right;  I&D right hand cat bite wound  . Breast surgery  11/16/2010    bilateral mastectomy+ right axillary node dissection,T1cN1a, Her2+,ERPR+  (right breast invasive ductal carcinoma)  . Transthoracic echocardiogram  05-28-2012    LVF NORMAL/  EF 55-60%    FAMILY HISTORY Family History  Problem Relation Age of Onset  . Cancer Maternal Grandfather   The patient's mother is living,  in her mid 2s, with no history of breast or ovarian cancer. The patient does not know her biological father. The patient has an older brother with bipolar disease, and according to the patient, borderline schizophrenia.   GYNECOLOGIC HISTORY: Menarche at age 21. She is G1, P1 with first pregnancy to term at age 77. The patient had significant pelvic pain recently and was evaluated with transvaginal and pelvic ultrasound on July 17th by Dr. Natale Milch. This showed normal uterine myometrium and endometrium with bilateral functional ovarian cysts. There were no worrisome features. She is currently on every three-month goserelin.  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 Her husband of 4 years, Crystal [name], works as a Administrator. In addition to them at home is the patient's son, Crystal [name], 3 years old attends [employer] in the tenth grade. Crystal [name], according to the patient, has significant nausea problems. Currently the girl friend of a friend of Crystal [name] and Carl's is living with them as  well. Apparently the friend died from a gunshot wound to the head, but it is not clear what the circumstances were. The patient attends a local [address]. [name] is pastor.    ADVANCED DIRECTIVES: Not in place  HEALTH MAINTENANCE: History  Substance Use Topics  . Smoking status: Current Every Day Smoker -- 1.00 packs/day for 20 years    Types: Cigarettes  . Smokeless tobacco: Never Used  . Alcohol Use: Yes     Comment: occa     Colonoscopy:  PAP:  Bone density:  Lipid panel:  Allergies  Allergen Reactions  . Tramadol Other (See Comments)    Seizures  . Phenytoin Nausea And Vomiting  . Thorazine (Chlorpromazine Hcl) Hives  . Ciprofloxacin Nausea And Vomiting  . Vancomycin Rash    Possible rash reported by MD    Current Outpatient Prescriptions  Medication Sig Dispense Refill  . ALPRAZolam (XANAX) 0.5 MG tablet Take 0.5  mg by mouth 3 (three) times daily as needed for anxiety.      . carvedilol (COREG) 6.25 MG tablet Take 6.25 mg by mouth daily as needed. For heart palpitations (rapid heart beat)      . citalopram (CELEXA) 20 MG tablet Take 40 mg by mouth every morning.       . gabapentin (NEURONTIN) 600 MG tablet Take 2 tablets (1,200 mg total) by mouth 3 (three) times daily.  180 tablet  0  . goserelin (ZOLADEX) 10.8 MG injection Inject 10.8 mg into the skin every 3 (three) months.      . ranitidine (ZANTAC) 150 MG tablet Take 150 mg by mouth 2 (two) times daily as needed for heartburn.      . tamoxifen (NOLVADEX) 20 MG tablet Take 20 mg by mouth every evening.      Marland Kitchen HYDROcodone-acetaminophen (NORCO/VICODIN) 5-325 MG per tablet Take 1 tablet by mouth every 8 (eight) hours as needed for pain.  12 tablet  0  . ibuprofen (ADVIL,MOTRIN) 200 MG tablet Take 200 mg by mouth every 6 (six) hours as needed for pain, fever or headache.       No current facility-administered medications for this visit.   Facility-Administered Medications Ordered in Other Visits  Medication Dose Route Frequency Provider Last Rate Last Dose  . bupivacaine (MARCAINE) 0.5 % 15 mL, phenazopyridine (PYRIDIUM) 400 mg bladder mixture   Bladder Instillation Once Scott A MacDiarmid, MD      . goserelin (ZOLADEX) injection 10.8 mg  10.8 mg Subcutaneous Q90 days Lowella Dell, MD   10.8 mg at 07/19/12 1616   OBJECTIVE: Young white female in no acute distress Filed Vitals:   07/19/12 1514  BP: 127/87  Pulse: 103  Temp: 98.2 F (36.8 C)  Resp: 20  ECOG: 1 Filed Weights   07/19/12 1514  Weight: 139 lb 14.4 oz (63.458 kg)   Physical Exam: HEENT:  Sclerae anicteric.  Oropharynx clear.  Nodes:  No cervical or supraclavicular lymphadenopathy  Breast Exam: Status post bilateral mastectomies. No evidence of local recurrence. Both axillae are clear.  Port is intact in the chest wall with no erythema or edema noted. Lungs:  Clear to  auscultation bilaterally.   Heart:  Regular rate and rhythm.   Abdomen:  Soft, nontender.  Positive bowel sounds.  Musculoskeletal:  No focal spinal tenderness Extremities:  No peripheral edema  Neuro:  Nonfocal, well oriented   LAB RESULTS: Lab Results  Component Value Date   WBC 10.5* 07/19/2012   NEUTROABS 7.1* 07/19/2012  HGB 15.0 07/19/2012   HCT 42.0 07/19/2012   MCV 92.1 07/19/2012   PLT 197 07/19/2012      Chemistry      Component Value Date/Time   NA 140 07/19/2012 1458   NA 138 07/12/2012 1535   K 3.7 07/19/2012 1458   K 3.7 07/12/2012 1535   CL 102 07/19/2012 1458   CL 102 07/12/2012 1535   CO2 26 07/19/2012 1458   CO2 24 07/12/2012 1535   BUN 14.5 07/19/2012 1458   BUN 14 07/12/2012 1535   CREATININE 1.0 07/19/2012 1458   CREATININE 1.02 07/12/2012 1535      Component Value Date/Time   CALCIUM 9.6 07/19/2012 1458   CALCIUM 9.0 07/12/2012 1535   ALKPHOS 86 07/19/2012 1458   ALKPHOS 74 07/12/2012 1535   AST 22 07/19/2012 1458   AST 18 07/12/2012 1535   ALT 15 07/19/2012 1458   ALT 11 07/12/2012 1535   BILITOT 0.58 07/19/2012 1458   BILITOT 0.2* 07/12/2012 1535       Lab Results  Component Value Date   LABCA2 16 08/24/2011     STUDIES:  Ct Abdomen Pelvis W Contrast  07/12/2012  *RADIOLOGY REPORT*  Clinical Data: Breast cancer.  Lower back pain and abdominal pain.  CT ABDOMEN AND PELVIS WITH CONTRAST  Technique:  Multidetector CT imaging of the abdomen and pelvis was performed following the standard protocol during bolus administration of intravenous contrast.  Contrast:  OMNIPAQUE IOHEXOL 300 MG/ML  SOLN  Comparison: 12/13/2008  Findings: Lung bases are clear.  No effusions.  Heart is normal size.  Liver, spleen, pancreas, adrenals and kidneys are normal.  Appendix is visualized and is normal.  2.9 cm in the and left ovarian cyst.  Uterus, right ovary and urinary bladder unremarkable.  Stomach, large and small bowel unremarkable.  No free fluid, free air or adenopathy.   No acute bony abnormality.  IMPRESSION: 2.9 cm left ovarian cyst.  Otherwise unremarkable study.   Original Report Authenticated By: Charlett Nose, M.D.     ASSESSMENT: 37 year old [place] woman, known to be BRCA1 and 2 negative,   (1) status post bilateral mastectomies May of 2012 for a right-sided T1c N1 (stage II) grade 3 invasive ductal carcinoma,estrogen and progesterone receptor positive, HER-2 amplified, with an MIB-1-1 of 85%.   (2) status post carboplatin/docetaxol/trastuzumab x6, completed mid December 2012   (3) on trastuzumab every 3 weeks, started August of 2012, interrupted between April and August of 2013, completed 04/27/2012. Most recent echo 03/19/2012  (4) status post radiation therapy, completed 07/12/2011  (5)  On tamoxifen since April 2013   (6) On goserelin given every 3 months, 1st injection on 10/26/11, most recent dose 07/19/2012.  PLAN:  This case was reviewed with Dr. [name] who also spoke with the patient and her husband today. Lexee is doing very well with regards to her breast cancer, and will continue on tamoxifen as before. We'll also receive her With next injection today. We will have her return in 6 weeks for a port flush, then again in 3 months for labs, port flush, Zoladex injection, and a followup with Dr. [name].  Per Dr. [name] suggestion we are referring Mozetta to see Dr. Macon Large at [facility] to discuss possible hysterectomy and salpingo-oophorectomy. She will continue to followup with regards to her cystocele, and if in fact she needs a surgical repair, perhaps it would be possible to perform the hysterectomy at the same time.  In the meanwhile, we're prescribing  hydrocodone/APAP at a low dose of 5/325 mg, one tablet every 8 hours as needed for pain, with 12 tablets dispensed. This is to be used only if her gabapentin is not taking care of her pain, and she will continue on her current dose which is 600 mg, 2 tablets 3 times daily for a  maximum of 3600 mg per day. She was also cautioned not to drink alcohol with either of these medications, and she was given written information on all of the above.   We will continue to try to get [place] in with a psychiatrist, perhaps at [facility]	[place]. I will place another referral since she has not yet been contacted. Also gave her information on our [facility] group, and she will pick up a schedule for our support groups as well.  This was reviewed thoroughly with both Niang and her husband Crystal [name] today. They voiced understanding and agreement with our plan, and will call with any changes or problems.    Kharter Brew    07/19/2012

## 2012-07-19 NOTE — Telephone Encounter (Signed)
Patient left a message stating that the urologist wants her to have a transvaginal ultrasound to look at her ovaries.

## 2012-07-19 NOTE — Telephone Encounter (Signed)
gv pt appt schedule May and June. Pt aware I will call re appt @ [facility] w/Dr. Macon Large - [phone] or [phone].

## 2012-07-20 ENCOUNTER — Encounter: Payer: Self-pay | Admitting: *Deleted

## 2012-07-20 ENCOUNTER — Telehealth: Payer: Self-pay | Admitting: *Deleted

## 2012-07-20 NOTE — H&P (Signed)
History of Present Illness   I was consulted by Dr. Kingsley Callander regarding Crystal [name]' pelvic pain. Based upon her history and review of her medical records, it sounds like she has had pelvic pain at least off and on since 2008. She said she has right and left lower quadrant pain that radiates, as well as low-back pain. It has been worse in the last 2 weeks. It is a daily problem. It is not relieved by voiding though there is a little bit less pressure after she voids. It is steady. She feels a sharp knife-like discomfort near her cervix for the last several years. She has marked dyspareunia for years.   She leaks with coughing, sneezing, and laughing but denies urge incontinence and enuresis, and does not wear pads.   In the morning she drinks coffee and voids every 15 minutes. She cannot sit through a 2-hour movie in the afternoon. She voids due to pressure, pain, frequency and urgency. She says her flow is good but sometimes it is low volume and slower. She gets up once or twice at night. Sometimes she does not feel empty.  She has had 2 urinary tract infections years ago. She denies a history of previous GU surgery and kidney stones. She has no neurologic risk factors or symptoms. She has not had a hysterectomy. She is prone to constipation.  She just finished chemotherapy and had a bilateral mastectomy. She tried a pessary for reported prolapse and it may have helped minimally, but then she felt irritated.   The pain is relieved by pain medicine. It is moderate in severity. It may be less if she lays down.    Past Medical History Problems  1. History of  Anxiety (Symptom) 300.00 2. History of  Anxiety (Symptom) 300.00 3. History of  Breast Cancer Bilateral V10.3 4. History of  Depression 311 5. History of  Esophageal Reflux 530.81 6. History of  Seizure 7. History of  Sinus Arrhythmia 427.9  Surgical History Problems  1. History of  Breast Surgery Mastectomy Bilateral V45.71 2.  History of  Hand Repair Right 3. History of  Oral Surgery Tooth Extraction  Current Meds 1. CeleXA 40 MG Oral Tablet; Therapy: (Recorded:04Mar2014) to 2. Coreg 6.25 MG Oral Tablet; Therapy: (Recorded:04Mar2014) to 3. Ibuprofen TABS; Therapy: (Recorded:04Mar2014) to 4. Tamoxifen Citrate TABS; Therapy: (Recorded:04Mar2014) to 5. Xanax 0.5 MG Oral Tablet; Therapy: (Recorded:04Mar2014) to 6. Zantac TABS; Therapy: (Recorded:04Mar2014) to 7. Zoladex 10.8 MG Subcutaneous Implant; Therapy: (Recorded:04Mar2014) to  Review of Systems Skin, eye and cardiovascular system(s) were reviewed and pertinent findings if present are noted.  Genitourinary: urinary frequency.  Gastrointestinal: heartburn, diarrhea and constipation.  Constitutional: night sweats and feeling tired (fatigue).  ENT: sinus problems.  Hematologic/Lymphatic: a tendency to easily bruise.  Respiratory: shortness of breath and cough.  Endocrine: polydipsia.  Musculoskeletal: back pain.  Neurological: dizziness and headache.  Psychiatric: depression and anxiety.    Vitals Vital Signs [Data Includes: Last 1 Day]  04Mar2014 01:49PM  BMI Calculated: 23.73 BSA Calculated: 1.68 Height: 5 ft 4 in Weight: 139 lb  Blood Pressure: 132 / 91 Temperature: 98.4 F Heart Rate: 99  Physical Exam Constitutional: Well nourished and well developed . No acute distress.  ENT:. The ears and nose are normal in appearance.  Neck: The appearance of the neck is normal and no neck mass is present.  Pulmonary: No respiratory distress and normal respiratory rhythm and effort.  Cardiovascular: Heart rate and rhythm are normal . No peripheral edema.  Abdomen: The abdomen is soft and nontender. No masses are palpated. No CVA tenderness. No hernias are palpable. No hepatosplenomegaly noted.  Lymphatics: The femoral and inguinal nodes are not enlarged or tender.  Skin: Normal skin turgor, no visible rash and no visible skin lesions.  Neuro/Psych:. Mood  and affect are appropriate.   . Genitourinary: On pelvic examination Crystal [name] had excellent support anteriorly and posteriorly.  She had very good vaginal length at rest.  When she bore down, she had elasticity with a little bit of rotational descent of her anterior vaginal wall.  She did not leak.  She did not have any central defect.  Again at rest, she had absolutely no prolapse.     Results/Data   Today Crystal [name] underwent a number of tests, which I personally reviewed.   Urinalysis: Negative.   Uroflowmetry: She voided 123 mL with a maximum flow of 20 mL/sec with a normal pattern.   Bladder Scan: Bladder scan residual was 0.0 mL. Urine [Data Includes: Last 1 Day]   04Mar2014  COLOR YELLOW   APPEARANCE CLEAR   SPECIFIC GRAVITY 1.020   pH 5.5   GLUCOSE NEG mg/dL  BILIRUBIN NEG   KETONE TRACE mg/dL  BLOOD MOD   PROTEIN NEG mg/dL  UROBILINOGEN 0.2 mg/dL  NITRITE NEG   LEUKOCYTE ESTERASE NEG   SQUAMOUS EPITHELIAL/HPF RARE   WBC NONE SEEN WBC/hpf  RBC 0-2 RBC/hpf  BACTERIA NONE SEEN   [name] NONE SEEN   CASTS NONE SEEN    Assessment Assessed  1. Female Stress Incontinence 625.6 2. Chronic Cystitis 595.2  Plan Health Maintenance (V70.0)  1. UA With REFLEX  Done: 04Mar2014 01:55PM  Discussion/Summary   Crystal [name] has significant lower abdominal and back pain. She has dyspareunia. I think her pain has been more acute recently and she has been in and out of the emergency room. She has mild stress incontinence. She has marked frequency, especially in the morning. She has mild nocturia. She has utilized a pessary in the past.   It sounds as though Crystal [name] finished her chemotherapy 2 months ago. I think her pelvic pain is much more chronic than this. Her levators were tender today. I talked about a hydrodistension.  We talked about cystoscopy/hydrodistension and instillation in detail. Pros, cons, general surgical and anesthetic risks, and other options including  watchful waiting were discussed. Risks were described but not limited to pain, infection, and bleeding. The risk of bladder perforation and management were discussed. The patient understands that it is primarily a diagnostic procedure.   Ms. Messman would like to proceed with a hydrodistension. She is nervous and she will start having a lot of pain again. I certainly can empathize with all of the medical issues she has had going on.   I am going to send a copy of my note to Dr. Kingsley Callander to keep her updated on her treatment course.  cc: Kingsley Callander, MD   I explained to Ms. Weyland that she has some elasticity of her anterior vaginal wall but no significant prolapse, and this is not the cause of her discomfort.  After a thorough review of the management options for the patient's condition the patient  elected to proceed with surgical therapy as noted above. We have discussed the potential benefits and risks of the procedure, side effects of the proposed treatment, the likelihood of the patient achieving the goals of the procedure, and any potential problems that might occur during the procedure or  recuperation. Informed consent has been obtained.

## 2012-07-20 NOTE — Progress Notes (Signed)
Received call from patient stating she is refusing to go to [facility].  She states she is the process of getting Medicaid and when that is complete will try to go somewhere else.

## 2012-07-20 NOTE — Telephone Encounter (Signed)
sw pt she is aware of her appts. Gv pt information for the [facility] for psych and Dr. Amedeo Gory.

## 2012-07-22 NOTE — Anesthesia Preprocedure Evaluation (Addendum)
Anesthesia Evaluation  Patient identified by MRN, date of birth, ID band Patient awake    Reviewed: Allergy & Precautions, H&P , NPO status , Patient's Chart, lab work & pertinent test results  Airway Mallampati: II TM Distance: >3 FB Neck ROM: Full    Dental no notable dental hx. (+) Dental Advisory Given   Pulmonary Current Smoker,  breath sounds clear to auscultation  Pulmonary exam normal       Cardiovascular + Peripheral Vascular Disease Rhythm:Regular Rate:Normal     Neuro/Psych Seizures -, Well Controlled,  Bipolar Disorder negative psych ROS   GI/Hepatic negative GI ROS, Neg liver ROS, GERD-  Medicated,  Endo/Other  H/O Breast Ca  Renal/GU negative Renal ROS  negative genitourinary   Musculoskeletal negative musculoskeletal ROS (+)   Abdominal   Peds negative pediatric ROS (+)  Hematology negative hematology ROS (+)   Anesthesia Other Findings   Reproductive/Obstetrics negative OB ROS Hx of Breast Cancer                          Anesthesia Physical Anesthesia Plan  ASA: II  Anesthesia Plan: General   Post-op Pain Management:    Induction: Intravenous  Airway Management Planned: LMA  Additional Equipment:   Intra-op Plan:   Post-operative Plan: Extubation in OR  Informed Consent: I have reviewed the patients History and Physical, chart, labs and discussed the procedure including the risks, benefits and alternatives for the proposed anesthesia with the patient or authorized representative who has indicated his/her understanding and acceptance.   Dental advisory given  Plan Discussed with: CRNA  Anesthesia Plan Comments:         Anesthesia Quick Evaluation

## 2012-07-23 ENCOUNTER — Encounter (HOSPITAL_BASED_OUTPATIENT_CLINIC_OR_DEPARTMENT_OTHER): Payer: Self-pay | Admitting: Anesthesiology

## 2012-07-23 ENCOUNTER — Ambulatory Visit (HOSPITAL_BASED_OUTPATIENT_CLINIC_OR_DEPARTMENT_OTHER): Payer: Self-pay | Admitting: Anesthesiology

## 2012-07-23 ENCOUNTER — Encounter (HOSPITAL_BASED_OUTPATIENT_CLINIC_OR_DEPARTMENT_OTHER): Payer: Self-pay | Admitting: *Deleted

## 2012-07-23 ENCOUNTER — Ambulatory Visit (HOSPITAL_BASED_OUTPATIENT_CLINIC_OR_DEPARTMENT_OTHER)
Admission: RE | Admit: 2012-07-23 | Discharge: 2012-07-23 | Disposition: A | Discharge status: Home / Self Care | Payer: Self-pay | Source: Ambulatory Visit | Attending: Urology | Admitting: Urology

## 2012-07-23 ENCOUNTER — Encounter (HOSPITAL_BASED_OUTPATIENT_CLINIC_OR_DEPARTMENT_OTHER)
Admission: RE | Disposition: A | Discharge status: Home / Self Care | Payer: Self-pay | Source: Ambulatory Visit | Attending: Urology

## 2012-07-23 ENCOUNTER — Telehealth: Payer: Self-pay

## 2012-07-23 DIAGNOSIS — F411 Generalized anxiety disorder: Secondary | ICD-10-CM

## 2012-07-23 DIAGNOSIS — N949 Unspecified condition associated with female genital organs and menstrual cycle: Secondary | ICD-10-CM | POA: Insufficient documentation

## 2012-07-23 HISTORY — DX: Gastro-esophageal reflux disease without esophagitis: K21.9

## 2012-07-23 HISTORY — DX: Straining to void: R39.16

## 2012-07-23 HISTORY — DX: Flushing: T45.1X5A

## 2012-07-23 HISTORY — DX: Bipolar disorder, current episode mixed, unspecified: F31.60

## 2012-07-23 HISTORY — DX: Flushing: R23.2

## 2012-07-23 HISTORY — DX: Urgency of urination: R39.15

## 2012-07-23 HISTORY — DX: Family history of other specified conditions: Z84.89

## 2012-07-23 HISTORY — DX: Personal history of malignant neoplasm of breast: Z85.3

## 2012-07-23 HISTORY — DX: Nocturia: R35.1

## 2012-07-23 HISTORY — DX: Unspecified ovarian cyst, left side: N83.202

## 2012-07-23 HISTORY — DX: Frequency of micturition: R35.0

## 2012-07-23 HISTORY — PX: CYSTO WITH HYDRODISTENSION: SHX5453

## 2012-07-23 HISTORY — DX: Personal history of other specified conditions: Z87.898

## 2012-07-23 HISTORY — DX: Right lower quadrant pain: R10.31

## 2012-07-23 HISTORY — DX: Adverse effect of antineoplastic and immunosuppressive drugs, initial encounter: R23.2

## 2012-07-23 SURGERY — CYSTOSCOPY, WITH BLADDER HYDRODISTENSION
Anesthesia: General | Wound class: Clean Contaminated

## 2012-07-23 MED ORDER — DEXAMETHASONE SODIUM PHOSPHATE 4 MG/ML IJ SOLN
INTRAMUSCULAR | Status: DC | PRN
  Administered 2012-07-23: 8 mg via INTRAVENOUS

## 2012-07-23 MED ORDER — LIDOCAINE HCL (CARDIAC) 20 MG/ML IV SOLN
INTRAVENOUS | Status: DC | PRN
  Administered 2012-07-23: 75 mg via INTRAVENOUS

## 2012-07-23 MED ORDER — FENTANYL CITRATE 0.05 MG/ML IJ SOLN
25.0000 ug | INTRAMUSCULAR | Status: DC | PRN
  Administered 2012-07-23 (×2): 25 ug via INTRAVENOUS
  Filled 2012-07-23: qty 1

## 2012-07-23 MED ORDER — MIDAZOLAM HCL 5 MG/5ML IJ SOLN
INTRAMUSCULAR | Status: DC | PRN
  Administered 2012-07-23: 1 mg via INTRAVENOUS

## 2012-07-23 MED ORDER — ALPRAZOLAM 0.5 MG PO TABS: 0.5000 mg | ORAL_TABLET | Freq: Three times a day (TID) | ORAL | Status: DC | PRN

## 2012-07-23 MED ORDER — PROPOFOL 10 MG/ML IV BOLUS
INTRAVENOUS | Status: DC | PRN
  Administered 2012-07-23: 180 mg via INTRAVENOUS

## 2012-07-23 MED ORDER — HYDROCODONE-ACETAMINOPHEN 5-325 MG PO TABS: 1.0000 | ORAL_TABLET | Freq: Four times a day (QID) | ORAL | Status: DC | PRN

## 2012-07-23 MED ORDER — ONDANSETRON HCL 4 MG/2ML IJ SOLN
INTRAMUSCULAR | Status: DC | PRN
  Administered 2012-07-23: 4 mg via INTRAVENOUS

## 2012-07-23 MED ORDER — LACTATED RINGERS IV SOLN
INTRAVENOUS | Status: DC
  Filled 2012-07-23: qty 1000

## 2012-07-23 MED ORDER — LACTATED RINGERS IV SOLN
INTRAVENOUS | Status: DC
  Administered 2012-07-23 (×2): via INTRAVENOUS
  Filled 2012-07-23: qty 1000

## 2012-07-23 MED ORDER — CEFAZOLIN SODIUM-DEXTROSE 2-3 GM-% IV SOLR
2.0000 g | INTRAVENOUS | Status: AC
  Administered 2012-07-23: 2 g via INTRAVENOUS
  Filled 2012-07-23: qty 50

## 2012-07-23 MED ORDER — FENTANYL CITRATE 0.05 MG/ML IJ SOLN
INTRAMUSCULAR | Status: DC | PRN
  Administered 2012-07-23: 50 ug via INTRAVENOUS

## 2012-07-23 MED ORDER — HYDROCODONE-ACETAMINOPHEN 5-325 MG PO TABS
1.0000 | ORAL_TABLET | Freq: Four times a day (QID) | ORAL | Status: AC | PRN
  Administered 2012-07-23: 1 via ORAL
  Filled 2012-07-23: qty 1

## 2012-07-23 MED ORDER — CEFAZOLIN SODIUM 1-5 GM-% IV SOLN
1.0000 g | INTRAVENOUS | Status: DC
  Filled 2012-07-23: qty 50

## 2012-07-23 MED ORDER — STERILE WATER FOR IRRIGATION IR SOLN
Status: DC | PRN
  Administered 2012-07-23: 3000 mL

## 2012-07-23 MED ORDER — EPHEDRINE SULFATE 50 MG/ML IJ SOLN
INTRAMUSCULAR | Status: DC | PRN
  Administered 2012-07-23 (×2): 10 mg via INTRAVENOUS

## 2012-07-23 MED ORDER — PHENAZOPYRIDINE HCL 200 MG PO TABS
ORAL | Status: DC | PRN
  Administered 2012-07-23: 08:00:00 via INTRAVESICAL

## 2012-07-23 SURGICAL SUPPLY — 20 items
BAG DRAIN URO-CYSTO SKYTR STRL (DRAIN) ×2 IMPLANT
CANISTER SUCT LVC 12 LTR MEDI- (MISCELLANEOUS) ×2 IMPLANT
CATH FOLEY 2WAY SLVR  5CC 18FR (CATHETERS)
CATH FOLEY 2WAY SLVR 5CC 18FR (CATHETERS) IMPLANT
CATH ROBINSON RED A/P 12FR (CATHETERS) IMPLANT
CATH ROBINSON RED A/P 14FR (CATHETERS) ×2 IMPLANT
CLOTH BEACON ORANGE TIMEOUT ST (SAFETY) ×2 IMPLANT
DRAPE CAMERA CLOSED 9X96 (DRAPES) ×2 IMPLANT
ELECT REM PT RETURN 9FT ADLT (ELECTROSURGICAL)
ELECTRODE REM PT RTRN 9FT ADLT (ELECTROSURGICAL) IMPLANT
GLOVE BIO SURGEON STRL SZ7.5 (GLOVE) ×2 IMPLANT
GLOVE SKINSENSE NS SZ7.0 (GLOVE) ×2
GLOVE SKINSENSE STRL SZ7.0 (GLOVE) ×2 IMPLANT
GOWN STRL REIN XL XLG (GOWN DISPOSABLE) ×2 IMPLANT
NDL SAFETY ECLIPSE 18X1.5 (NEEDLE) ×1 IMPLANT
NEEDLE HYPO 18GX1.5 SHARP (NEEDLE) ×1
PACK CYSTOSCOPY (CUSTOM PROCEDURE TRAY) ×2 IMPLANT
SUT SILK 0 TIES 10X30 (SUTURE) IMPLANT
SYR 20CC LL (SYRINGE) ×2 IMPLANT
WATER STERILE IRR 3000ML UROMA (IV SOLUTION) ×2 IMPLANT

## 2012-07-23 NOTE — OR Nursing (Signed)
Report to [place] RN as caregiver

## 2012-07-23 NOTE — Telephone Encounter (Signed)
Pharm requests RF of alprozolam 0.5 mg

## 2012-07-23 NOTE — Progress Notes (Signed)
Dr. Caryl Pina in to see patient and to answer question pt. and spouse had.

## 2012-07-23 NOTE — Anesthesia Procedure Notes (Signed)
Procedure Name: LMA Insertion Date/Time: 07/23/2012 7:32 AM Performed by: Fran Lowes Pre-anesthesia Checklist: Patient identified, Emergency Drugs available, Suction available and Patient being monitored Patient Re-evaluated:Patient Re-evaluated prior to inductionOxygen Delivery Method: Circle System Utilized Preoxygenation: Pre-oxygenation with 100% oxygen Intubation Type: IV induction Ventilation: Mask ventilation without difficulty LMA: LMA inserted LMA Size: 4.0 Number of attempts: 1 Airway Equipment and Method: bite block Placement Confirmation: positive ETCO2 Tube secured with: Tape Dental Injury: Teeth and Oropharynx as per pre-operative assessment

## 2012-07-23 NOTE — Op Note (Signed)
Preoperative diagnosis: Pelvic pain Postoperative diagnosis: Pelvic pain Surgery: Bladder hydrodistention plus bladder insulation therapy and cystoscopy Surgeon: Dr. Lorin Picket Lawrencia Mauney  The patient consented the above procedure with the above diagnoses. Preoperative antibiotics were given. Leg position was excellent. 21 French cystoscope was utilized.  On initial inspection bladder mucosa and trigone were normal. Is no stitch or foreign body or carcinoma. There is no evidence of chemical cystitis from chemotherapy  Under anesthesia she had minimal cystocele even with bladder hydrodistention  The bladder was emptied and reinspected was cystoscopy. There were no glomerulations or findings in keeping with interstitial cystitis. She was hydrodistended to 900 mL.  The bladder was emptied.  As a separate procedure with a red rubber catheter I instilled 15 cc of 0.5% Marcaine was 400 mg a pretty and  She'll be followed as per protocol

## 2012-07-23 NOTE — Interval H&P Note (Signed)
History and Physical Interval Note:  07/23/2012 7:19 AM  Crystal [name]  has presented today for surgery, with the diagnosis of Pelvic Pain  The various methods of treatment have been discussed with the patient and family. After consideration of risks, benefits and other options for treatment, the patient has consented to  Procedure(s) with comments: CYSTOSCOPY/HYDRODISTENSION (N/A) - INSTILLATION   as a surgical intervention .  The patient's history has been reviewed, patient examined, no change in status, stable for surgery.  I have reviewed the patient's chart and labs.  Questions were answered to the patient's satisfaction.     Berley Gambrell A

## 2012-07-23 NOTE — Anesthesia Postprocedure Evaluation (Signed)
Anesthesia Post Note  Patient: Crystal [name]  Procedure(s) Performed: Procedure(s) (LRB): CYSTOSCOPY/HYDRODISTENSION instillation of marcaine and pyridium (N/A)  Anesthesia type: General  Patient location: PACU  Post pain: Pain level controlled  Post assessment: Post-op Vital signs reviewed  Last Vitals:  Filed Vitals:   07/23/12 0800  BP: 92/52  Pulse: 74  Temp:   Resp: 17    Post vital signs: Reviewed  Level of consciousness: sedated  Complications: No apparent anesthesia complications

## 2012-07-23 NOTE — Transfer of Care (Signed)
Immediate Anesthesia Transfer of Care Note  Patient: Crystal [name]  Procedure(s) Performed: Procedure(s) (LRB): CYSTOSCOPY/HYDRODISTENSION instillation of marcaine and pyridium (N/A)  Patient Location: Patient transported to PACU with oxygen via face mask at 4 Liters / Min  Anesthesia Type: General  Level of Consciousness: awake and alert   Airway & Oxygen Therapy: Patient Spontanous Breathing and Patient connected to face mask oxygen  Post-op Assessment: Report given to PACU RN and Post -op Vital signs reviewed and stable  Post vital signs: Reviewed and stable  Dentition: Teeth and oropharynx remain in pre-op condition  Complications: No apparent anesthesia complications

## 2012-07-23 NOTE — Telephone Encounter (Signed)
Called pt and left message that I will forward her message to Dr. Erin Fulling for review. She will be contacted once we have a response.

## 2012-07-24 ENCOUNTER — Encounter (HOSPITAL_BASED_OUTPATIENT_CLINIC_OR_DEPARTMENT_OTHER): Payer: Self-pay | Admitting: Urology

## 2012-07-24 ENCOUNTER — Other Ambulatory Visit: Payer: Self-pay | Admitting: Radiology

## 2012-07-24 ENCOUNTER — Telehealth: Payer: Self-pay | Admitting: Oncology

## 2012-07-24 NOTE — Telephone Encounter (Signed)
RECORDS FAX TO DR. Donnajean Lopes @ [phone]

## 2012-07-24 NOTE — Telephone Encounter (Signed)
lmonvm for Crystal [name] referral coordinator @ [facility]) requesting appt for pt at their center (psychiatry referral). Awaiting return call.

## 2012-07-24 NOTE — Telephone Encounter (Signed)
Please advise on Fax received from [facility] requesting patients

## 2012-07-24 NOTE — Telephone Encounter (Signed)
This has been sent in by Dr Patsy Lager

## 2012-07-24 NOTE — Telephone Encounter (Signed)
S/w Antoinette @ [facility] re appt w/Dr. Dr. Macon Large. Per Antoinette fax notes for review to [phone] and they will fax back an appt confirmation. Per Sherry Ruffing the next available appt is not until 4/17. Copy of referral and fax coversheet taken to HIM to send notes ASAP.

## 2012-07-25 ENCOUNTER — Encounter: Payer: Self-pay | Admitting: *Deleted

## 2012-07-26 ENCOUNTER — Telehealth: Payer: Self-pay

## 2012-07-26 NOTE — Telephone Encounter (Signed)
Pharm requested Rx for Alprazolam 0.5 mg. Called and verified that pt was asking to fill it there. Called and cancelled Rx at [facility] that was called in 07/23/12 after verifying that pt has not p/up. Called in the same Rx to [facility].

## 2012-07-27 ENCOUNTER — Encounter: Payer: Self-pay | Admitting: Family Medicine

## 2012-07-30 NOTE — Addendum Note (Signed)
Addended by: Willodean Rosenthal on: 07/30/2012 01:26 PM   Modules accepted: Orders

## 2012-07-30 NOTE — Telephone Encounter (Signed)
OK to get TV sono.  clh-S

## 2012-08-01 NOTE — Telephone Encounter (Signed)
Patient called and left message stating she is calling Diane back and missed her phone call and that we are welcome to leave any information on her voicemail if she doesn't answer

## 2012-08-01 NOTE — Telephone Encounter (Addendum)
Called pt and left message to call back regarding additional information related to last week's call.  **Pt needs to be informed that Dr. Erin Fulling has ordered the [place] and it needs to be scheduled.

## 2012-08-02 NOTE — Telephone Encounter (Signed)
Called and spoke w/pt regarding TV ultrasound which has been approved and ordered by Dr. Erin Fulling. Pt states that she does want the ultrasound but is having financial difficulties right now. She is pursuing options for financial assistance and would like to wait about a month or so before scheduling the [place]. She will call us back when she is ready to schedule the appt. I confirmed understanding of her situation.

## 2012-08-13 ENCOUNTER — Ambulatory Visit: Payer: Self-pay | Admitting: Family Medicine

## 2012-08-13 VITALS — BP 122/78 | HR 80 | Temp 97.3°F | Resp 18 | Wt 139.0 lb

## 2012-08-13 DIAGNOSIS — F411 Generalized anxiety disorder: Secondary | ICD-10-CM

## 2012-08-13 DIAGNOSIS — G43909 Migraine, unspecified, not intractable, without status migrainosus: Secondary | ICD-10-CM

## 2012-08-13 MED ORDER — HYDROCODONE-ACETAMINOPHEN 5-325 MG PO TABS: 1.0000 | ORAL_TABLET | Freq: Four times a day (QID) | ORAL | Status: DC | PRN

## 2012-08-13 MED ORDER — ALPRAZOLAM 0.5 MG PO TABS: 0.5000 mg | ORAL_TABLET | Freq: Three times a day (TID) | ORAL | Status: DC | PRN

## 2012-08-13 MED ORDER — ALPRAZOLAM 0.5 MG PO TABS: ORAL_TABLET | ORAL | Status: DC

## 2012-08-13 NOTE — Progress Notes (Signed)
[facility] [address] [place] [address] [phone]  Date:  08/13/2012   Name:  Crystal [name]   DOB:  1975-06-05   MRN:  [id number]  PCP:  [name], MD    Chief Complaint: Anxiety   History of Present Illness:  Crystal [name] is a 37 y.o. very pleasant female patient who presents with the following:  She is here today for refills.   She is doing well from a breast cancer standpoint.  She is on tamoxifen and gets zoladex every 3 months.   She does have migraine HA- she gets these sometimes and uses hydrocodone on occasion which does get rid of her HA.  She has had the HA for the last 2 or 3 days.  She plans to attend a cancer support group- this starts next month.   She is using the neurontin for hot flashes and also for her pelvic pains.  This is a typical migraine HA to her.  She has used hydrocodone for HA in the past and it has helped.  She would like to have some more to use if needed.  This HA started with pain in the muscles of the right side of her neck/ shoulder a couple of days ago- it "locked up" and seemed to trigger a typical migraine HA.  She is sensitive to light but does not have any vomiting.  No neurological changes  She did receive norco from Dr. Sherron Monday on 3/24, #30.  I also gave her an rx for xanax #90 (she takes it TID) on 07/23/12.    She is able to take hydrocodone- history of possible seizure with tramadol in combination with celexa, but she is able to take hydrocodone without a problem.    States that she is actually still taking xanax 2 pills three times a day- she is taking a total of 1mg  TID most days.    Patient Active Problem List  Diagnosis  . NAUSEA AND VOMITING  . ABDOMINAL PAIN, RIGHT LOWER QUADRANT  . Breast cancer  . Cellulitis and abscess  . Seizure  . Anemia associated with chemotherapy  . Hx MRSA infection  . Tachycardia - pulse  . Generalized convulsive seizure  . Seizure disorder  . Migraine   . Anxiety disorder  . Depression  . Chemotherapy induced cardiomyopathy  . Syncope  . Diarrhea  . Cough  . Palpitations    Past Medical History  Diagnosis Date  . Status post chemotherapy     docetaxel/carboplatin/trastuzumab  . Anxiety   . Hx MRSA infection 03/28/11    Skin  . History of breast cancer DX JUNE 2012 W/ RIGHT BREAST INVASIVE DUCTAL CA  STAGE IIB---  S/P BILATERAL MASTECTOMIES AND RIGHT NODE DISSECTION    ONCOLOGIST-- DR [name]--  CHEMO ENDED DEC 2012 / RADIATION ENDED MAR 2013--  CURRENT ON TAMOXIFEN AND ZOLADEX  . Family history of anesthesia complication     MOTHER PONV  . Hot flashes due to tamoxifen   . History of idiopathic seizure     DX 2008 --  LAST ONE 2012--  NO ISSUES SINCE AND NO MEDS. -- PT STATES DOCTORS FELT IT WAS STRESS RELATED DUE TO HEALTH ISSUES  . Bipolar affective disorder, mixed   . GERD (gastroesophageal reflux disease)     OCCASIONALLY TAKE ZANTAC  . Left ovarian cyst   . Right lower quadrant abdominal pain   . Frequency of urination   . Urgency of urination   .  Strains to urinate   . Nocturia   . Heart palpitations   . Cancer   . Depression   . Seizures     Past Surgical History  Procedure Laterality Date  . Wisdom tooth extraction    . Biopsy breast  10/14/2010    right needle core biopsy  . Portacath placement  11/16/2010    placement of left subclavian port  . I&d extremity  09/26/2011    Procedure: IRRIGATION AND DEBRIDEMENT EXTREMITY;  Surgeon: [name] Ribas, MD;  Location: [facility];  Service: Orthopedics;  Laterality: Right;  I&D right hand cat bite wound  . Breast surgery  11/16/2010    bilateral mastectomy+ right axillary node dissection,T1cN1a, Her2+,ERPR+  (right breast invasive ductal carcinoma)  . Transthoracic echocardiogram  05-28-2012    LVF NORMAL/  EF 55-60%  . Cysto with hydrodistension N/A 07/23/2012    Procedure: CYSTOSCOPY/HYDRODISTENSION instillation of marcaine and pyridium;  Surgeon: Martina Sinner,  MD;  Location: [facility];  Service: Urology;  Laterality: N/A;  INSTILLATION      History  Substance Use Topics  . Smoking status: Current Every Day Smoker -- 1.00 packs/day for 20 years    Types: Cigarettes  . Smokeless tobacco: Never Used  . Alcohol Use: Yes     Comment: occa    Family History  Problem Relation Age of Onset  . Cancer Maternal Grandfather   . Mental illness Brother   . Mental illness Son     Allergies  Allergen Reactions  . Tramadol Other (See Comments)    Seizures  . Phenytoin Nausea And Vomiting  . Thorazine (Chlorpromazine Hcl) Hives  . Ciprofloxacin Nausea And Vomiting  . Vancomycin Rash    Possible rash reported by MD    Medication list has been reviewed and updated.  Current Outpatient Prescriptions on File Prior to Visit  Medication Sig Dispense Refill  . ALPRAZolam (XANAX) 0.5 MG tablet Take 1 tablet (0.5 mg total) by mouth 3 (three) times daily as needed for anxiety.  90 tablet  0  . carvedilol (COREG) 6.25 MG tablet Take 6.25 mg by mouth daily as needed. For heart palpitations (rapid heart beat)      . citalopram (CELEXA) 20 MG tablet Take 40 mg by mouth every morning.       . gabapentin (NEURONTIN) 600 MG tablet Take 2 tablets (1,200 mg total) by mouth 3 (three) times daily.  180 tablet  0  . goserelin (ZOLADEX) 10.8 MG injection Inject 10.8 mg into the skin every 3 (three) months.      . ranitidine (ZANTAC) 150 MG tablet Take 150 mg by mouth 2 (two) times daily as needed for heartburn.      . tamoxifen (NOLVADEX) 20 MG tablet Take 20 mg by mouth every evening.      Marland Kitchen HYDROcodone-acetaminophen (NORCO) 5-325 MG per tablet Take 1 tablet by mouth every 6 (six) hours as needed for pain.  30 tablet  0   Current Facility-Administered Medications on File Prior to Visit  Medication Dose Route Frequency Provider Last Rate Last Dose  . bupivacaine (MARCAINE) 0.5 % 15 mL, phenazopyridine (PYRIDIUM) 400 mg bladder mixture   Bladder  Instillation Once Martina Sinner, MD        Review of Systems:  As per HPI- otherwise negative.   Physical Examination: Filed Vitals:   08/13/12 0925  BP: 122/78  Pulse: 80  Temp: 97.3 F (36.3 C)  Resp: 18   Filed Vitals:  08/13/12 0925  Weight: 139 lb (63.05 kg)   Body mass index is 23.85 kg/(m^2). Ideal Body Weight:    GEN: WDWN, NAD, Non-toxic, A & O x 3.  Prefers lights out as she has a migraine HA.  HEENT: Atraumatic, Normocephalic. Neck supple. No masses, No LAD. Ears and Nose: No external deformity. CV: RRR, No M/G/R. No JVD. No thrill. No extra heart sounds. PULM: CTA B, no wheezes, crackles, rhonchi. No retractions. No resp. distress. No accessory muscle use. ABD: S, NT, ND, +BS. No rebound. No HSM. EXTR: No c/c/e NEURO Normal gait. Complete neurological exam is normal- neck supple, no facial droop, normal strength, sensation and DTR [name] extremities PSYCH: Normally interactive. Conversant. Not depressed or anxious appearing.  Calm demeanor.    Assessment and Plan: Anxiety state, unspecified - Plan: ALPRAZolam (XANAX) 0.5 MG tablet, DISCONTINUED: ALPRAZolam (XANAX) 0.5 MG tablet  Migraine, unspecified, without mention of intractable migraine without mention of status migrainosus - Plan: HYDROcodone-acetaminophen (NORCO) 5-325 MG per tablet  Gave a small supply of hydrocodone to use for recurrent migraine.  Refilled her xanax for anxiety, continue to use celexa   Meds ordered this encounter  Medications  . HYDROcodone-acetaminophen (NORCO) 5-325 MG per tablet    Sig: Take 1 tablet by mouth every 6 (six) hours as needed for pain.    Dispense:  10 tablet    Refill:  0  . DISCONTD: ALPRAZolam (XANAX) 0.5 MG tablet    Sig: Take 1 tablet (0.5 mg total) by mouth 3 (three) times daily as needed for anxiety. To fill on 08/21/12    Dispense:  90 tablet    Refill:  1  . ALPRAZolam (XANAX) 0.5 MG tablet    Sig: Take 1 or 2 three times a day for anxiety.  To fill  on 08/21/12    Dispense:  180 tablet    Refill:  1   Gave her the rx for xanax 180, as she states she takes 2 three times a day.  However, discussed with her the fact that we want her to start trying to taper her use of xanax.  She will try, but still feels that she needs this medication.  Encouraged her work on decreasing her xanax use, and explained that especially with tamoxifen we want her to use as little as possible.   Signed [name], MD

## 2012-08-13 NOTE — Patient Instructions (Addendum)
Please use the xanax as needed for anxiety.  Do not combine hydrocodone and xanax as this could cause excessive sedation.  Remember that hydrocodone is only to be used for severe pain.    Please let us know if your headache does not resolve in the next couple of days- Sooner if worse.

## 2012-08-15 ENCOUNTER — Telehealth: Payer: Self-pay

## 2012-08-15 NOTE — Telephone Encounter (Signed)
Pt would like to speak with Dr. Patsy Lager, Headache is gone. Muscle spasms  spreading to calf  - using heating pad - no relief from pain.   [phone]

## 2012-08-15 NOTE — Telephone Encounter (Signed)
Spoke to patient, she does not want to go for doppler study or come back here today. I advised her with her symptoms she may have blood clots. She states she does not think she has a blood clot. She states if it gets worse she will go to the emergency room. She is advised also, if she develops any shortness of breath she is to go to the Emergency room immediately, she agrees. To you FYI Amy

## 2012-08-15 NOTE — Telephone Encounter (Signed)
Called her, calf pain is bilateral, feels tight , she thinks this is from her back but I am concerned it is not, I asked her if she squeezes her calf if this increased her pain, she states it feels "tight", and squeezing does make it worse. Please advise.

## 2012-08-16 ENCOUNTER — Telehealth (HOSPITAL_COMMUNITY): Payer: Self-pay | Admitting: Cardiology

## 2012-08-16 ENCOUNTER — Telehealth: Payer: Self-pay | Admitting: Oncology

## 2012-08-16 DIAGNOSIS — C50919 Malignant neoplasm of unspecified site of unspecified female breast: Secondary | ICD-10-CM

## 2012-08-16 NOTE — Telephone Encounter (Signed)
Orders placed for upcoming echo  

## 2012-08-16 NOTE — Telephone Encounter (Signed)
S/w Antoinette [phone]) @ [facility]) to check status of appt. Per Sherry Ruffing pt can be seen 4/21 @ 1:30pm. Also touched back w/Melissa @ [facility] re appt [phone]) and per Efraim Kaufmann because she is not already a patient she will need to come to office on a walk in basis Mon - Fri 8am - 3pm. Per Melissa the earlier she comes in the shorter her wait time will be. S/w pt's husband Crystal [name] re above appts and per [place] call back and left a detailed message re above as he was out in a field and couldn't take this down. Crystal [name] will call me back if he has any questions. He was also aware that the appt with Dr. Macon Large at [facility] does not conflict with the echo/[facility] appt the pt has that morning.

## 2012-08-20 ENCOUNTER — Encounter: Payer: Self-pay | Admitting: *Deleted

## 2012-08-20 ENCOUNTER — Encounter: Payer: Self-pay | Admitting: Obstetrics & Gynecology

## 2012-08-20 ENCOUNTER — Encounter (HOSPITAL_COMMUNITY): Payer: Self-pay

## 2012-08-20 ENCOUNTER — Ambulatory Visit (HOSPITAL_BASED_OUTPATIENT_CLINIC_OR_DEPARTMENT_OTHER)
Admission: RE | Admit: 2012-08-20 | Discharge: 2012-08-20 | Disposition: A | Discharge status: Home / Self Care | Payer: Self-pay | Source: Ambulatory Visit | Attending: Internal Medicine | Admitting: Internal Medicine

## 2012-08-20 ENCOUNTER — Ambulatory Visit (HOSPITAL_COMMUNITY)
Admission: RE | Admit: 2012-08-20 | Discharge: 2012-08-20 | Disposition: A | Discharge status: Home / Self Care | Payer: Self-pay | Source: Ambulatory Visit | Attending: Internal Medicine | Admitting: Internal Medicine

## 2012-08-20 ENCOUNTER — Ambulatory Visit (INDEPENDENT_AMBULATORY_CARE_PROVIDER_SITE_OTHER): Payer: Self-pay | Admitting: Obstetrics & Gynecology

## 2012-08-20 VITALS — BP 130/84 | HR 119 | Temp 97.3°F | Ht 64.0 in | Wt 136.8 lb

## 2012-08-20 DIAGNOSIS — K219 Gastro-esophageal reflux disease without esophagitis: Secondary | ICD-10-CM | POA: Insufficient documentation

## 2012-08-20 DIAGNOSIS — Z9221 Personal history of antineoplastic chemotherapy: Secondary | ICD-10-CM | POA: Insufficient documentation

## 2012-08-20 DIAGNOSIS — R002 Palpitations: Secondary | ICD-10-CM

## 2012-08-20 DIAGNOSIS — N949 Unspecified condition associated with female genital organs and menstrual cycle: Secondary | ICD-10-CM

## 2012-08-20 DIAGNOSIS — F411 Generalized anxiety disorder: Secondary | ICD-10-CM | POA: Insufficient documentation

## 2012-08-20 DIAGNOSIS — R5383 Other fatigue: Secondary | ICD-10-CM

## 2012-08-20 DIAGNOSIS — Z79899 Other long term (current) drug therapy: Secondary | ICD-10-CM | POA: Insufficient documentation

## 2012-08-20 DIAGNOSIS — I890 Lymphedema, not elsewhere classified: Secondary | ICD-10-CM | POA: Insufficient documentation

## 2012-08-20 DIAGNOSIS — Z853 Personal history of malignant neoplasm of breast: Secondary | ICD-10-CM | POA: Insufficient documentation

## 2012-08-20 DIAGNOSIS — C50919 Malignant neoplasm of unspecified site of unspecified female breast: Secondary | ICD-10-CM

## 2012-08-20 DIAGNOSIS — Z5189 Encounter for other specified aftercare: Secondary | ICD-10-CM

## 2012-08-20 DIAGNOSIS — F319 Bipolar disorder, unspecified: Secondary | ICD-10-CM | POA: Insufficient documentation

## 2012-08-20 DIAGNOSIS — R252 Cramp and spasm: Secondary | ICD-10-CM | POA: Insufficient documentation

## 2012-08-20 DIAGNOSIS — Z09 Encounter for follow-up examination after completed treatment for conditions other than malignant neoplasm: Secondary | ICD-10-CM

## 2012-08-20 LAB — COMPREHENSIVE METABOLIC PANEL
Albumin: 4.3 g/dL (ref 3.5–5.2)
Alkaline Phosphatase: 94 U/L (ref 39–117)
BUN: 12 mg/dL (ref 6–23)
CO2: 25 mEq/L (ref 19–32)
Chloride: 104 mEq/L (ref 96–112)
Creatinine, Ser: 0.81 mg/dL (ref 0.50–1.10)
GFR calc Af Amer: >90 mL/min (ref >90)
GFR calc non Af Amer: >90 mL/min (ref >90)
Glucose, Bld: 83 mg/dL (ref 70–99)
Potassium: 3.6 mEq/L (ref 3.5–5.1)
Total Bilirubin: 0.3 mg/dL (ref 0.3–1.2)

## 2012-08-20 LAB — CBC
HCT: 43.8 % (ref 36.0–46.0)
Hemoglobin: 16.1 g/dL — ABNORMAL HIGH (ref 12.0–15.0)
MCH: 33.4 pg (ref 26.0–34.0)
MCHC: 36.8 g/dL — ABNORMAL HIGH (ref 30.0–36.0)

## 2012-08-20 NOTE — Patient Instructions (Addendum)
Labs today.  Follow up as needed with Dr. Gala Romney

## 2012-08-20 NOTE — Assessment & Plan Note (Addendum)
As above, will check labs today and follow up with PCP next week.   Attending: Agree.

## 2012-08-20 NOTE — Patient Instructions (Signed)
Return to clinic for any scheduled appointments or for any gynecologic concerns as needed.   

## 2012-08-20 NOTE — Assessment & Plan Note (Addendum)
EF continues to look good, echo reviewed with patient and husband today.  Will continue prn carvedilol for palpitations.  She can follow up with her PCP and the HF clinic as needed.  Patient seen and examined with Ulyess Blossom, PA-C. We discussed all aspects of the encounter. I agree with the assessment and plan as stated above.  Echo reviewed personally. EF looks fine. She has finished Herceptin. No further f/u required. Can use Carvedilol prn for palpitations.

## 2012-08-20 NOTE — Progress Notes (Signed)
  Echocardiogram 2D Echocardiogram has been performed.  Jorje Guild 08/20/2012, 10:33 AM

## 2012-08-20 NOTE — Assessment & Plan Note (Addendum)
Will check BMET and magnesium for cramping, she has follow up with PCP on Monday.  Attending: Agree.

## 2012-08-20 NOTE — Progress Notes (Signed)
GYNECOLOGY CLINIC ATTENDING PROGRESS NOTE  Crystal [name] is a 37 y.o. G1P1001 with history of breast cancer here today with two main concerns:  1) Cystocele:  This has been evaluated by Dr. Erin Fulling and Dr. Alfredo Martinez (Urology) who both felt this was mild and no significant prolapse was noted on examination.  Patient is concerned this may be causing her dyspareunia.  2) Chronic pelvic pain:  Her pain has been present for several years. She had an incidental 2.9 cm cyst seen on CT scan on 07/12/12 and says that she always has ovarian cysts that cause her a lot of pain. A pelvic ultrasound was recommended, she declined this due to cost.  She is on a GnRH agonist (Zoladex) which should inhibit ovulation and she does not have any menstrual periods.  She was evaluated by Urology and the impression was of pelvic floor dysfunction/pain in her pelvic girdle muscles.  Cystoscopy was negative for interstitial cystitis.  Patient was offered to undergo the ultrasound versus laparoscopy but she declined both now due to cost.  She just wants pain medication.  Muscle relaxants were offered and she declined.  She wanted Vicodin or Percocet; she was told this will not be prescribed without a GYN etiology.  She was very upset and this encounter ended here.  Patient should not be given any narcotics in this clinic unless she undergoes pelvic ultrasound or laparoscopy that establishes a GYN diagnosis.  Jaynie Collins, MD, FACOG Attending Obstetrician & Gynecologist Faculty Practice, [facility]

## 2012-08-20 NOTE — Progress Notes (Signed)
Referring Physician: Dr. [name] Primary Care: Dr. Warner Mccreedy Primary Cardiologist:  none  HPI: Crystal [name] is a 37 y.o. female with history of seizures (2 episodes with last one ~4 yrs ago), sinus tach and BRCA1 stage II invasive ductal carcinoma, ER/PR/HER-2 positive.  Status post bilateral mastectomy with right axillary lymph node dissection by Dr. Carolynne Edouard on 11/16/10.  She is status post carboplatin/docetaxol/trastuzumab x6, completed mid December 2012.  She is continuing on trastuzumab every 3 weeks for a total of 1 year.  She has also began radiation therapy and will begin antiestrogen therapy once radiation has been completed.  She has been having lymphedema in the right upper extremity and is being seen in the lymphedema clinic.    Echos:  03/03/11: EF 60-65%, lateral S' >16 (top cut off) 06/28/11 EF 50% s' 11.8 08/31/11 EF 45-50% s' 10.2 - herceptin held 10/31/11 EF 55%, 10.5 01/18/12 EF 55% 10.3 03/19/12 EF 50-55% 12.4 05/28/12 EF 50-55% 11.9 08/20/12 EF 55% 10.9 (poor window)  She returns for follow up today.  She had to put down her cat today and it made her  Last week she was extremely tired/exhausted and confused, unable to count money.  She has back spasms and cramping in legs.  She feels faint and almost passing out with urination.  Feels that she is having prolapsed bladder.  Has follow up with ob/gyn today.  Taking carvedilol 1-2 x/wk.  No chest pain.     Review of Systems: All pertinent positives/negatives as in HPI, otherwise negative.   Past Medical History  Diagnosis Date  . Status post chemotherapy     docetaxel/carboplatin/trastuzumab  . Anxiety   . Hx MRSA infection 03/28/11    Skin  . History of breast cancer DX JUNE 2012 W/ RIGHT BREAST INVASIVE DUCTAL CA  STAGE IIB---  S/P BILATERAL MASTECTOMIES AND RIGHT NODE DISSECTION    ONCOLOGIST-- DR [name]--  CHEMO ENDED DEC 2012 / RADIATION ENDED MAR 2013--  CURRENT ON TAMOXIFEN AND ZOLADEX  . Family history of  anesthesia complication     MOTHER PONV  . Hot flashes due to tamoxifen   . History of idiopathic seizure     DX 2008 --  LAST ONE 2012--  NO ISSUES SINCE AND NO MEDS. -- PT STATES DOCTORS FELT IT WAS STRESS RELATED DUE TO HEALTH ISSUES  . Bipolar affective disorder, mixed   . GERD (gastroesophageal reflux disease)     OCCASIONALLY TAKE ZANTAC  . Left ovarian cyst   . Right lower quadrant abdominal pain   . Frequency of urination   . Urgency of urination   . Strains to urinate   . Nocturia   . Heart palpitations   . Cancer   . Depression   . Seizures     Current Outpatient Prescriptions  Medication Sig Dispense Refill  . ALPRAZolam (XANAX) 0.5 MG tablet Take 1 or 2 three times a day for anxiety.  To fill on 08/21/12  180 tablet  1  . carvedilol (COREG) 6.25 MG tablet Take 6.25 mg by mouth daily as needed. For heart palpitations (rapid heart beat)      . citalopram (CELEXA) 20 MG tablet Take 40 mg by mouth every morning.       . gabapentin (NEURONTIN) 600 MG tablet Take 2 tablets (1,200 mg total) by mouth 3 (three) times daily.  180 tablet  0  . goserelin (ZOLADEX) 10.8 MG injection Inject 10.8 mg into the skin every 3 (three) months.      Marland Kitchen  HYDROcodone-acetaminophen (NORCO) 5-325 MG per tablet Take 1 tablet by mouth every 6 (six) hours as needed for pain.  10 tablet  0  . ranitidine (ZANTAC) 150 MG tablet Take 150 mg by mouth 2 (two) times daily as needed for heartburn.      . tamoxifen (NOLVADEX) 20 MG tablet Take 20 mg by mouth every evening.       No current facility-administered medications for this encounter.   Facility-Administered Medications Ordered in Other Encounters  Medication Dose Route Frequency Provider Last Rate Last Dose  . bupivacaine (MARCAINE) 0.5 % 15 mL, phenazopyridine (PYRIDIUM) 400 mg bladder mixture   Bladder Instillation Once Martina Sinner, MD        Allergies  Allergen Reactions  . Tramadol Other (See Comments)    Seizures  . Phenytoin Nausea  And Vomiting  . Thorazine (Chlorpromazine Hcl) Hives  . Ciprofloxacin Nausea And Vomiting  . Vancomycin Rash    Possible rash reported by MD    PHYSICAL EXAM: Filed Vitals:   08/20/12 1103  BP: 112/90  Pulse: 96  Weight: 137 lb 1.6 oz (62.188 kg)  SpO2: 100%   General:  Thin, No respiratory difficulty HEENT: normal Neck: supple. no JVD. Carotids 2+ bilat; no bruits. No lymphadenopathy or thryomegaly appreciated. Cor: PMI nondisplaced. regular rhythm. No rubs, gallops or murmurs. Lungs: clear Abdomen: soft, nontender, nondistended. No hepatosplenomegaly. No bruits or masses. Good bowel sounds. Extremities: no cyanosis, clubbing, rash, edema.   Neuro: alert & oriented x 3, cranial nerves grossly intact. moves all 4 extremities w/o difficulty. Affect pleasant.   ASSESSMENT & PLAN:

## 2012-08-21 ENCOUNTER — Other Ambulatory Visit: Payer: Self-pay | Admitting: Physician Assistant

## 2012-08-21 DIAGNOSIS — G8929 Other chronic pain: Secondary | ICD-10-CM | POA: Insufficient documentation

## 2012-08-22 ENCOUNTER — Encounter (HOSPITAL_COMMUNITY): Payer: Self-pay | Admitting: Emergency Medicine

## 2012-08-22 ENCOUNTER — Telehealth: Payer: Self-pay

## 2012-08-22 ENCOUNTER — Emergency Department (HOSPITAL_COMMUNITY)
Admission: EM | Admit: 2012-08-22 | Discharge: 2012-08-22 | Disposition: A | Discharge status: Home / Self Care | Payer: Self-pay | Attending: Emergency Medicine | Admitting: Emergency Medicine

## 2012-08-22 ENCOUNTER — Telehealth: Payer: Self-pay | Admitting: Radiology

## 2012-08-22 DIAGNOSIS — F17200 Nicotine dependence, unspecified, uncomplicated: Secondary | ICD-10-CM | POA: Insufficient documentation

## 2012-08-22 DIAGNOSIS — F329 Major depressive disorder, single episode, unspecified: Secondary | ICD-10-CM | POA: Insufficient documentation

## 2012-08-22 DIAGNOSIS — R252 Cramp and spasm: Secondary | ICD-10-CM | POA: Insufficient documentation

## 2012-08-22 DIAGNOSIS — Z8614 Personal history of Methicillin resistant Staphylococcus aureus infection: Secondary | ICD-10-CM | POA: Insufficient documentation

## 2012-08-22 DIAGNOSIS — Z8589 Personal history of malignant neoplasm of other organs and systems: Secondary | ICD-10-CM | POA: Insufficient documentation

## 2012-08-22 DIAGNOSIS — Z8742 Personal history of other diseases of the female genital tract: Secondary | ICD-10-CM | POA: Insufficient documentation

## 2012-08-22 DIAGNOSIS — K219 Gastro-esophageal reflux disease without esophagitis: Secondary | ICD-10-CM | POA: Insufficient documentation

## 2012-08-22 DIAGNOSIS — Z8679 Personal history of other diseases of the circulatory system: Secondary | ICD-10-CM | POA: Insufficient documentation

## 2012-08-22 DIAGNOSIS — Z87448 Personal history of other diseases of urinary system: Secondary | ICD-10-CM | POA: Insufficient documentation

## 2012-08-22 DIAGNOSIS — F411 Generalized anxiety disorder: Secondary | ICD-10-CM | POA: Insufficient documentation

## 2012-08-22 DIAGNOSIS — IMO0001 Reserved for inherently not codable concepts without codable children: Secondary | ICD-10-CM | POA: Insufficient documentation

## 2012-08-22 DIAGNOSIS — F3289 Other specified depressive episodes: Secondary | ICD-10-CM | POA: Insufficient documentation

## 2012-08-22 DIAGNOSIS — Z853 Personal history of malignant neoplasm of breast: Secondary | ICD-10-CM | POA: Insufficient documentation

## 2012-08-22 DIAGNOSIS — R197 Diarrhea, unspecified: Secondary | ICD-10-CM | POA: Insufficient documentation

## 2012-08-22 DIAGNOSIS — Z8669 Personal history of other diseases of the nervous system and sense organs: Secondary | ICD-10-CM | POA: Insufficient documentation

## 2012-08-22 DIAGNOSIS — I82409 Acute embolism and thrombosis of unspecified deep veins of unspecified lower extremity: Secondary | ICD-10-CM | POA: Insufficient documentation

## 2012-08-22 DIAGNOSIS — Z79899 Other long term (current) drug therapy: Secondary | ICD-10-CM | POA: Insufficient documentation

## 2012-08-22 LAB — CBC
HCT: 38.9 % (ref 36.0–46.0)
Hemoglobin: 14 g/dL (ref 12.0–15.0)
MCHC: 36 g/dL (ref 30.0–36.0)
MCV: 92.2 fL (ref 78.0–100.0)
RDW: 12.3 % (ref 11.5–15.5)
WBC: 11.1 10*3/uL — ABNORMAL HIGH (ref 4.0–10.5)

## 2012-08-22 LAB — COMPREHENSIVE METABOLIC PANEL
Albumin: 3.6 g/dL (ref 3.5–5.2)
BUN: 11 mg/dL (ref 6–23)
Chloride: 103 mEq/L (ref 96–112)
Creatinine, Ser: 0.96 mg/dL (ref 0.50–1.10)
GFR calc Af Amer: 88 mL/min — ABNORMAL LOW (ref >90)
Glucose, Bld: 89 mg/dL (ref 70–99)
Total Bilirubin: 0.2 mg/dL — ABNORMAL LOW (ref 0.3–1.2)

## 2012-08-22 MED ORDER — OXYCODONE-ACETAMINOPHEN 5-325 MG PO TABS
1.0000 | ORAL_TABLET | Freq: Once | ORAL | Status: AC
  Administered 2012-08-22: 1 via ORAL
  Filled 2012-08-22: qty 1

## 2012-08-22 MED ORDER — OXYCODONE-ACETAMINOPHEN 5-325 MG PO TABS: 1.0000 | ORAL_TABLET | ORAL | Status: DC | PRN

## 2012-08-22 NOTE — ED Notes (Signed)
Pt reports having spasms and pain in BLE (calfs), and her arm. Pt also sts she had similar pain in her neck and shoulder muscles as well as her back last week.

## 2012-08-22 NOTE — Telephone Encounter (Signed)
PT WOULD LIKE TO SPEAK WITH SOMEONE REGARDING THE MUSCLE SPASMS SHE IS HAVING. HAD ALREADY SPOKEN WITH SOMEONE ABOUT IT BEFORE PLEASE CALL [phone]

## 2012-08-22 NOTE — ED Notes (Signed)
Per pt, saw PCP for headache last week, was given pain meds which she completed-now has lower extremity swelling, weakness and cramps

## 2012-08-22 NOTE — Telephone Encounter (Signed)
    Amy Carlynn Purl, Rad Tech at 08/15/2012 11:11 AM    Status: Signed             Spoke to patient, she does not want to go for doppler study or come back here today. I advised her with her symptoms she may have blood clots. She states she does not think she has a blood clot. She states if it gets worse she will go to the emergency room. She is advised also, if she develops any shortness of breath she is to go to the Emergency room immediately, she agrees. To you FYI Amy        Caffie Damme, Rad Tech at 08/15/2012 10:57 AM    Status: Signed             Called her, calf pain is bilateral, feels tight , she thinks this is from her back but I am concerned it is not, I asked her if she squeezes her calf if this increased her pain, she states it feels "tight", and squeezing does make it worse. Please advise.

## 2012-08-22 NOTE — Telephone Encounter (Signed)
Patient advised her calves are sore, cramping, advised her to come in for this, now.

## 2012-08-22 NOTE — ED Provider Notes (Signed)
History     CSN: [id number]  Arrival date & time 08/22/12  1502   First MD Initiated Contact with Patient 08/22/12 1601      Chief Complaint  Patient presents with  . DVT    (Consider location/radiation/quality/duration/timing/severity/associated sxs/prior treatment) HPI   The patient is a 37 year old female past medical history significant for breast cancer presenting for bilateral calf cramping and pain for the last week and a half. Pt  Patient called her PCP and was referred to ED to r/o DVT. Patient is currently taking tamoxifen for treatment of her breast cancer, is no longer undergoing chemotherapy or radiation at this point. Patient denies recent travel, estrogen use, history of DVT or PE, cough, shortness of breath or difficulty breathing, chest pain, unilateral swallowing or erythematous lower leg.  Past Medical History  Diagnosis Date  . Status post chemotherapy     docetaxel/carboplatin/trastuzumab  . Anxiety   . Hx MRSA infection 03/28/11    Skin  . History of breast cancer DX JUNE 2012 W/ RIGHT BREAST INVASIVE DUCTAL CA  STAGE IIB---  S/P BILATERAL MASTECTOMIES AND RIGHT NODE DISSECTION    ONCOLOGIST-- DR [name]--  CHEMO ENDED DEC 2012 / RADIATION ENDED MAR 2013--  CURRENT ON TAMOXIFEN AND ZOLADEX  . Family history of anesthesia complication     MOTHER PONV  . Hot flashes due to tamoxifen   . History of idiopathic seizure     DX 2008 --  LAST ONE 2012--  NO ISSUES SINCE AND NO MEDS. -- PT STATES DOCTORS FELT IT WAS STRESS RELATED DUE TO HEALTH ISSUES  . Bipolar affective disorder, mixed   . GERD (gastroesophageal reflux disease)     OCCASIONALLY TAKE ZANTAC  . Left ovarian cyst   . Right lower quadrant abdominal pain   . Frequency of urination   . Urgency of urination   . Strains to urinate   . Nocturia   . Heart palpitations   . Cancer   . Depression   . Seizures     Past Surgical History  Procedure Laterality Date  . Wisdom tooth extraction    .  Biopsy breast  10/14/2010    right needle core biopsy  . Portacath placement  11/16/2010    placement of left subclavian port  . I&d extremity  09/26/2011    Procedure: IRRIGATION AND DEBRIDEMENT EXTREMITY;  Surgeon: [name] Ribas, MD;  Location: [facility];  Service: Orthopedics;  Laterality: Right;  I&D right hand cat bite wound  . Breast surgery  11/16/2010    bilateral mastectomy+ right axillary node dissection,T1cN1a, Her2+,ERPR+  (right breast invasive ductal carcinoma)  . Transthoracic echocardiogram  05-28-2012    LVF NORMAL/  EF 55-60%  . Cysto with hydrodistension N/A 07/23/2012    Procedure: CYSTOSCOPY/HYDRODISTENSION instillation of marcaine and pyridium;  Surgeon: Martina Sinner, MD;  Location: [facility];  Service: Urology;  Laterality: N/A;  INSTILLATION      Family History  Problem Relation Age of Onset  . Cancer Maternal Grandfather   . Mental illness Brother   . Mental illness Son     History  Substance Use Topics  . Smoking status: Current Every Day Smoker -- 1.00 packs/day for 20 years    Types: Cigarettes  . Smokeless tobacco: Never Used  . Alcohol Use: Yes     Comment: occa    OB History   Grav Para Term Preterm Abortions TAB SAB Ect Mult Living   1 1 1  0  0 0 0 0 0 1      Review of Systems  Constitutional: Negative.   HENT: Negative.   Eyes: Negative.   Respiratory: Negative.   Cardiovascular: Negative.   Gastrointestinal: Positive for diarrhea.  Genitourinary: Negative.   Musculoskeletal: Positive for myalgias. Negative for gait problem.       Cramping  Skin: Negative.   Neurological: Negative.     Allergies  Tramadol; Phenytoin; Thorazine; Ciprofloxacin; and Vancomycin  Home Medications   Current Outpatient Rx  Name  Route  Sig  Dispense  Refill  . ALPRAZolam (XANAX) 0.5 MG tablet      Take 1 or 2 three times a day for anxiety.  To fill on 08/21/12   180 tablet   1   . carvedilol (COREG) 6.25 MG tablet   Oral   Take  6.25 mg by mouth daily as needed. For heart palpitations (rapid heart beat)         . citalopram (CELEXA) 20 MG tablet   Oral   Take 40 mg by mouth every morning.          . gabapentin (NEURONTIN) 600 MG tablet      TAKE TWO TABLETS BY MOUTH THREE TIMES DAILY   180 tablet   1   . goserelin (ZOLADEX) 10.8 MG injection   Subcutaneous   Inject 10.8 mg into the skin every 3 (three) months.         . ranitidine (ZANTAC) 150 MG tablet   Oral   Take 150 mg by mouth 2 (two) times daily as needed for heartburn.         . tamoxifen (NOLVADEX) 20 MG tablet   Oral   Take 20 mg by mouth every evening.         Marland Kitchen HYDROcodone-acetaminophen (NORCO) 5-325 MG per tablet   Oral   Take 1 tablet by mouth every 6 (six) hours as needed for pain.   10 tablet   0   . oxyCODONE-acetaminophen (PERCOCET/ROXICET) 5-325 MG per tablet   Oral   Take 1 tablet by mouth every 4 (four) hours as needed for pain.   8 tablet   0     BP 129/90  Pulse 72  Temp(Src) 98 F (36.7 C) (Oral)  Resp 18  SpO2 99%  Physical Exam  Constitutional: She is oriented to person, place, and time. She appears well-developed and well-nourished.  HENT:  Head: Normocephalic and atraumatic.  Mouth/Throat: Oropharynx is clear and moist.  Eyes: Conjunctivae and EOM are normal. Pupils are equal, round, and reactive to light.  Neck: Normal range of motion. Neck supple.  Cardiovascular: Normal rate, regular rhythm and normal heart sounds.   Pulses:      Popliteal pulses are 2+ on the right side, and 2+ on the left side.       Dorsalis pedis pulses are 2+ on the right side, and 2+ on the left side.       Posterior tibial pulses are 2+ on the right side, and 2+ on the left side.  Pulmonary/Chest: Effort normal and breath sounds normal.  Abdominal: Soft. Bowel sounds are normal. There is no tenderness.  Musculoskeletal:       Right ankle: Normal.       Left ankle: Normal.       Right lower leg: Normal.       Left  lower leg: Normal.  Neurological: She is alert and oriented to person, place, and time.  Skin: Skin  is warm and dry. No burn, no ecchymosis, no laceration, no lesion, no petechiae and no rash noted. No cyanosis or erythema. No pallor.  Psychiatric: She has a normal mood and affect.    ED Course  Procedures (including critical care time)  Medications  oxyCODONE-acetaminophen (PERCOCET/ROXICET) 5-325 MG per tablet 1 tablet (1 tablet Oral Given 08/22/12 1705)  oxyCODONE-acetaminophen (PERCOCET/ROXICET) 5-325 MG per tablet 1 tablet (1 tablet Oral Given 08/22/12 1810)     Patient requesting to follow up as outpatient for venous duplex study to R/O DVT rather than to continue waiting in ED.   Labs Reviewed  CBC - Abnormal; Notable for the following:    WBC 11.1 (*)    Platelets 127 (*)    All other components within normal limits  COMPREHENSIVE METABOLIC PANEL - Abnormal; Notable for the following:    Total Bilirubin 0.2 (*)    GFR calc non Af Amer 76 (*)    GFR calc Af Amer 88 (*)    All other components within normal limits   No results found.   1. Cramps, muscle, general       MDM  Patient was referred to ED by her primary care physician for a DVT rule out based on complaint of bilateral lower extremity cramping and discomfort. Physical examination showed no erythematous lower extremities, no swelling was appreciated, popliteal DP PT pulses intact, no palpable cord, negative Homans sign. No concerning signs for cellulitis based on physical examination at this time. Patient declined to stay in emergency department a week for venous duplex study to rule out DVT. Patient stated she would follow as outpatient with vascular study lab tomorrow for study. Patient was advised the importance of following up with vascular lab to rule out DVT although no apparent emergent need for study at this point. Patient was agreeable to plan. Patient case was discussed with Dr. Judd Lien who agrees with the  course. Patient was stable at time of discharge.      Jeannetta Ellis, PA-C 08/23/12 [phone]

## 2012-08-23 ENCOUNTER — Ambulatory Visit (HOSPITAL_COMMUNITY): Admission: RE | Admit: 2012-08-23 | Payer: Self-pay | Source: Ambulatory Visit

## 2012-08-23 NOTE — ED Provider Notes (Signed)
Medical screening examination/treatment/procedure(s) were performed by non-physician practitioner and as supervising physician I was immediately available for consultation/collaboration.  Won Kreuzer, MD 08/23/12 1932 

## 2012-08-23 NOTE — Telephone Encounter (Signed)
I did speak to patient again yesterday. She was advised here or to ER. Amy

## 2012-08-24 ENCOUNTER — Telehealth: Payer: Self-pay

## 2012-08-24 NOTE — Telephone Encounter (Signed)
Pt is needing to speak to someone about needing an appointment for ultrasound to check for blood clots Call back number is [phone]

## 2012-08-30 ENCOUNTER — Other Ambulatory Visit: Payer: Self-pay | Admitting: Physician Assistant

## 2012-08-30 ENCOUNTER — Ambulatory Visit (HOSPITAL_BASED_OUTPATIENT_CLINIC_OR_DEPARTMENT_OTHER): Payer: Self-pay

## 2012-08-30 ENCOUNTER — Other Ambulatory Visit: Payer: Self-pay | Admitting: Oncology

## 2012-08-30 ENCOUNTER — Ambulatory Visit (HOSPITAL_BASED_OUTPATIENT_CLINIC_OR_DEPARTMENT_OTHER): Payer: Self-pay | Admitting: Lab

## 2012-08-30 VITALS — BP 116/81 | HR 90 | Temp 98.2°F

## 2012-08-30 DIAGNOSIS — C50919 Malignant neoplasm of unspecified site of unspecified female breast: Secondary | ICD-10-CM

## 2012-08-30 DIAGNOSIS — R5383 Other fatigue: Secondary | ICD-10-CM

## 2012-08-30 DIAGNOSIS — C50911 Malignant neoplasm of unspecified site of right female breast: Secondary | ICD-10-CM

## 2012-08-30 DIAGNOSIS — R252 Cramp and spasm: Secondary | ICD-10-CM

## 2012-08-30 DIAGNOSIS — Z452 Encounter for adjustment and management of vascular access device: Secondary | ICD-10-CM

## 2012-08-30 DIAGNOSIS — M2550 Pain in unspecified joint: Secondary | ICD-10-CM

## 2012-08-30 LAB — CBC WITH DIFFERENTIAL/PLATELET
BASO%: 0.6 % (ref 0.0–2.0)
Eosinophils Absolute: 0.3 10*3/uL (ref 0.0–0.5)
LYMPH%: 28.1 % (ref 14.0–49.7)
MCHC: 35.2 g/dL (ref 31.5–36.0)
MONO#: 0.4 10*3/uL (ref 0.1–0.9)
NEUT#: 6.1 10*3/uL (ref 1.5–6.5)
Platelets: 219 10*3/uL (ref 145–400)
RBC: 4.24 10*6/uL (ref 3.70–5.45)
RDW: 12.7 % (ref 11.2–14.5)
WBC: 9.5 10*3/uL (ref 3.9–10.3)
lymph#: 2.7 10*3/uL (ref 0.9–3.3)

## 2012-08-30 MED ORDER — ALTEPLASE 2 MG IJ SOLR
2.0000 mg | Freq: Once | INTRAMUSCULAR | Status: AC | PRN
  Administered 2012-08-30: 2 mg
  Filled 2012-08-30: qty 2

## 2012-08-30 MED ORDER — SODIUM CHLORIDE 0.9 % IJ SOLN
10.0000 mL | INTRAMUSCULAR | Status: DC | PRN
  Administered 2012-08-30: 10 mL via INTRAVENOUS
  Filled 2012-08-30: qty 10

## 2012-08-30 MED ORDER — HEPARIN SOD (PORK) LOCK FLUSH 100 UNIT/ML IV SOLN
500.0000 [IU] | Freq: Once | INTRAVENOUS | Status: AC
  Administered 2012-08-30: 500 [IU] via INTRAVENOUS
  Filled 2012-08-30: qty 5

## 2012-08-30 NOTE — Progress Notes (Signed)
  Crystal [name] seen her briefly today while having her port flushed. She continues to complain of diffuse joint pain, fatigue, and muscle cramps.  She was evaluated in the emergency room last week for cramps bilaterally in her lower legs. This does turn felt there was a low possibility of DVT, despite the fact that she is on tamoxifen. She denies any swelling in the lower extremities, has had no redness, palpable cords, and also denies any increased shortness of breath. She tells me she tends to have muscle cramps frequently "all over" and that this feels like "muscle pain". I offered to schedule a Doppler of both lower extremities, and at this time she declines. I asked her to contact us with any increased pain, swelling, redness, or to proceed to the emergency room with any acute onset of shortness of breath.  Crystal [name] has tried multiple pain medications in the past, and is getting no relief from her joint pain. Dr. [name] have suggested a referral to Dr. Jeananne Rama to evaluate from a rheumatology standpoint, possibly for fibromyalgia.  All this was reviewed with Crystal [name] today who voices understanding and agreement with this plan. I will mention that she did not request any pain medications today.  Zollie Scale, PA-C 08/30/2012

## 2012-08-31 ENCOUNTER — Telehealth: Payer: Self-pay | Admitting: *Deleted

## 2012-08-31 NOTE — Telephone Encounter (Signed)
Informed from scheduling Dr Ferd Glassing is not accepting fibromyalgia pts nor pt who are uninsured.   This RN placed a referral to outpt rehab for post mastecomy pain, fatique, pelvic pain and general malaise.

## 2012-08-31 NOTE — Telephone Encounter (Signed)
i informed Crystal [name] that Dr. Titus Dubin is not taking any new pt w/Fibromyalgia, also they must have insurance....td

## 2012-09-12 ENCOUNTER — Encounter: Payer: Self-pay | Admitting: Oncology

## 2012-09-12 NOTE — Progress Notes (Signed)
Alteplase is not a replaceable injection.

## 2012-10-10 ENCOUNTER — Other Ambulatory Visit: Payer: Self-pay | Admitting: Physician Assistant

## 2012-10-10 DIAGNOSIS — C50911 Malignant neoplasm of unspecified site of right female breast: Secondary | ICD-10-CM

## 2012-10-11 ENCOUNTER — Ambulatory Visit (HOSPITAL_BASED_OUTPATIENT_CLINIC_OR_DEPARTMENT_OTHER): Payer: Self-pay | Admitting: Oncology

## 2012-10-11 ENCOUNTER — Other Ambulatory Visit: Payer: Self-pay | Admitting: Oncology

## 2012-10-11 ENCOUNTER — Other Ambulatory Visit (HOSPITAL_BASED_OUTPATIENT_CLINIC_OR_DEPARTMENT_OTHER): Payer: Self-pay | Admitting: Lab

## 2012-10-11 ENCOUNTER — Ambulatory Visit (HOSPITAL_BASED_OUTPATIENT_CLINIC_OR_DEPARTMENT_OTHER): Payer: Self-pay

## 2012-10-11 VITALS — BP 132/92 | HR 65 | Temp 98.2°F | Resp 20 | Ht 64.0 in | Wt 139.9 lb

## 2012-10-11 DIAGNOSIS — C50911 Malignant neoplasm of unspecified site of right female breast: Secondary | ICD-10-CM

## 2012-10-11 DIAGNOSIS — C773 Secondary and unspecified malignant neoplasm of axilla and upper limb lymph nodes: Secondary | ICD-10-CM

## 2012-10-11 DIAGNOSIS — F411 Generalized anxiety disorder: Secondary | ICD-10-CM

## 2012-10-11 DIAGNOSIS — C50919 Malignant neoplasm of unspecified site of unspecified female breast: Secondary | ICD-10-CM

## 2012-10-11 DIAGNOSIS — Z9010 Acquired absence of unspecified breast and nipple: Secondary | ICD-10-CM

## 2012-10-11 DIAGNOSIS — Z5111 Encounter for antineoplastic chemotherapy: Secondary | ICD-10-CM

## 2012-10-11 LAB — COMPREHENSIVE METABOLIC PANEL (CC13)
ALT: 26 U/L (ref 0–55)
AST: 23 U/L (ref 5–34)
Alkaline Phosphatase: 90 U/L (ref 40–150)
CO2: 26 mEq/L (ref 22–29)
Creatinine: 1 mg/dL (ref 0.6–1.1)
Sodium: 141 mEq/L (ref 136–145)
Total Bilirubin: 0.38 mg/dL (ref 0.20–1.20)
Total Protein: 6.9 g/dL (ref 6.4–8.3)

## 2012-10-11 LAB — CBC WITH DIFFERENTIAL/PLATELET
BASO%: 0.6 % (ref 0.0–2.0)
Eosinophils Absolute: 0.2 10*3/uL (ref 0.0–0.5)
HCT: 43.2 % (ref 34.8–46.6)
HGB: 15.1 g/dL (ref 11.6–15.9)
LYMPH%: 31.7 % (ref 14.0–49.7)
MONO#: 0.4 10*3/uL (ref 0.1–0.9)
NEUT#: 4.7 10*3/uL (ref 1.5–6.5)
NEUT%: 59.1 % (ref 38.4–76.8)
Platelets: 191 10*3/uL (ref 145–400)
WBC: 7.9 10*3/uL (ref 3.9–10.3)
lymph#: 2.5 10*3/uL (ref 0.9–3.3)

## 2012-10-11 MED ORDER — HEPARIN SOD (PORK) LOCK FLUSH 100 UNIT/ML IV SOLN
500.0000 [IU] | Freq: Once | INTRAVENOUS | Status: AC
  Administered 2012-10-11: 500 [IU] via INTRAVENOUS
  Filled 2012-10-11: qty 5

## 2012-10-11 MED ORDER — GOSERELIN ACETATE 10.8 MG ~~LOC~~ IMPL
10.8000 mg | DRUG_IMPLANT | SUBCUTANEOUS | Status: DC
  Administered 2012-10-11: 10.8 mg via SUBCUTANEOUS
  Filled 2012-10-11: qty 10.8

## 2012-10-11 MED ORDER — SODIUM CHLORIDE 0.9 % IJ SOLN
10.0000 mL | INTRAMUSCULAR | Status: DC | PRN
  Administered 2012-10-11: 10 mL via INTRAVENOUS
  Filled 2012-10-11: qty 10

## 2012-10-11 NOTE — Patient Instructions (Signed)
Call MD for problems or concerns 

## 2012-10-11 NOTE — Progress Notes (Signed)
ID: Crystal [name]   DOB: 1976-04-25  MR#: [id number]  NWG#:[id number]  VHQ:[id number], MD GYN: SURoyston Sinner, MD OTHER:  [name], MD    HISTORY OF PRESENT ILLNESS: The patient is a 37 year old [place] woman who noted a mass in her right breast and brought it to her primary care physician's attention June of 2012. She was set up for mammography at [facility], where an area of calcifications highly suspicious for carcinoma was biopsied. This was in the upper outer quadrant and it measured 1.7 cm mammographically and 1.8 cm by ultrasonography. The pathology report ([id number]) showed a high-grade invasive ductal carcinoma estrogen receptor positive at 64% progesterone receptor positive at 18%, and HER-2 amplified with a ratio by CISH of 5.24. MIB-1-1 was 65%. A second mass biopsied at the same time was also triple positive.  Given her multiple masses in the right breast, mastectomy was recommended. The patient actually opted for bilateral mastectomies and this was performed together with a right axillary lymph node dissection under Dr. Felicity Pellegrini on 11/16/2010. The final pathology [id number]) showed, on the left, no evidence of cancer. On the right the patient had a high-grade invasive ductal carcinoma measuring 1.9 cm, with negative margins, grade 3, involving 2 of 16 lymph nodes (stage IIB).  Her subsequent history is as detailed below.  INTERVAL HISTORY:  [name] returns today accompanied by her husband [name] for followup of her right breast carcinoma. The interval history is significant for a great deal of monetary stress. She tells me they have received the bill 4 $56,000, which most of it is from radiation treatments. They thought everything had been covered. They are meeting with our financial advisers today to discuss this further.  REVIEW OF SYSTEMS: There are some other sources of distress including the fact that her son is thinking of quitting school. She thinks he might end up in  the job core. [name] and she are drinking more than they showed, according to [facility]. They will have a sixpack in a bottle wine at night most nights. They are concerned upper prickly about dependence and also the relationship between breast cancer and alcohol. Aside from those issues Seena is having some heartburn issues feels anxious depressed of has her chronic headaches, but denies nausea, vomiting, cough, phlegm production, pleurisy, or change in bowel or bladder habits. She is tolerating the tamoxifen with hot flashes as the main side effect. A detailed review of systems was otherwise stable.  PAST MEDICAL HISTORY: Past Medical History  Diagnosis Date  . Status post chemotherapy     docetaxel/carboplatin/trastuzumab  . Anxiety   . Hx MRSA infection 03/28/11    Skin  . History of breast cancer DX JUNE 2012 W/ RIGHT BREAST INVASIVE DUCTAL CA  STAGE IIB---  S/P BILATERAL MASTECTOMIES AND RIGHT NODE DISSECTION    ONCOLOGIST-- DR [name]--  CHEMO ENDED DEC 2012 / RADIATION ENDED MAR 2013--  CURRENT ON TAMOXIFEN AND ZOLADEX  . Family history of anesthesia complication     MOTHER PONV  . Hot flashes due to tamoxifen   . History of idiopathic seizure     DX 2008 --  LAST ONE 2012--  NO ISSUES SINCE AND NO MEDS. -- PT STATES DOCTORS FELT IT WAS STRESS RELATED DUE TO HEALTH ISSUES  . Bipolar affective disorder, mixed   . GERD (gastroesophageal reflux disease)     OCCASIONALLY TAKE ZANTAC  . Left ovarian cyst   . Right lower quadrant abdominal pain   . Frequency  of urination   . Urgency of urination   . Strains to urinate   . Nocturia   . Heart palpitations   . Cancer   . Depression   . Seizures   1. History of seizures. The patient has been thoroughly evaluated both here by Dr. [name] and at [facility] for this. Among many studies, she had an MRI of the brain on August 28th. This showed an incidental solitary small lesion in the left frontoparietal white matter. It was not felt to be  suggestive of multiple sclerosis. A noncontrasted CT in August 2010 was negative. The patient has been off all seizure medications now for 2 months. There has been no evidence of seizure recurrence. 2. Complex psychology history (please refer to Dr. [name] note in the E-chart from August 29, 2009) with evidence of anxiety disorder, depression and psychosis. The patient is currently on no medications for this and denies any of these symptoms at present. 3. History of migraines. 4. Remote history of multidrug abuse. 5. Status post wisdom teeth extraction.  History of MRSA skin infection   PAST SURGICAL HISTORY: Past Surgical History  Procedure Laterality Date  . Wisdom tooth extraction    . Biopsy breast  10/14/2010    right needle core biopsy  . Portacath placement  11/16/2010    placement of left subclavian port  . I&d extremity  09/26/2011    Procedure: IRRIGATION AND DEBRIDEMENT EXTREMITY;  Surgeon: [name] Ribas, MD;  Location: [facility];  Service: Orthopedics;  Laterality: Right;  I&D right hand cat bite wound  . Breast surgery  11/16/2010    bilateral mastectomy+ right axillary node dissection,T1cN1a, Her2+,ERPR+  (right breast invasive ductal carcinoma)  . Transthoracic echocardiogram  05-28-2012    LVF NORMAL/  EF 55-60%  . Cysto with hydrodistension N/A 07/23/2012    Procedure: CYSTOSCOPY/HYDRODISTENSION instillation of marcaine and pyridium;  Surgeon: Martina Sinner, MD;  Location: [facility];  Service: Urology;  Laterality: N/A;  INSTILLATION      FAMILY HISTORY Family History  Problem Relation Age of Onset  . Cancer Maternal Grandfather   . Mental illness Brother   . Mental illness Son   The patient's mother is living, in her mid 44s, with no history of breast or ovarian cancer. The patient does not know her biological father. The patient has an older brother with bipolar disease, and according to the patient, borderline schizophrenia.   GYNECOLOGIC  HISTORY: Menarche at age 24. She is G1, P1 with first pregnancy to term at age 44. The patient had significant pelvic pain recently and was evaluated with transvaginal and pelvic ultrasound on July 17th by Dr. Natale Milch. This showed normal uterine myometrium and endometrium with bilateral functional ovarian cysts. There were no worrisome features. She is currently on every three-month goserelin.  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 Her husband of 4 years, [name], works as a Administrator. In addition to them at home is the patient's son, [name], 73 years old attends [employer] in the 11TH grade. [name], according to the patient, has significant nausea problems. The patient attends a local [address]. [name] is pastor.    ADVANCED DIRECTIVES: Not in place  HEALTH MAINTENANCE: History  Substance Use Topics  . Smoking status: Current Every Day Smoker -- 1.00 packs/day for 20 years    Types: Cigarettes  . Smokeless tobacco: Never Used  .  Alcohol Use: Yes     Comment: occa     Colonoscopy:  PAP:  Bone density:  Lipid panel:  Allergies  Allergen Reactions  . Tramadol Other (See Comments)    Seizures  . Phenytoin Nausea And Vomiting  . Thorazine (Chlorpromazine Hcl) Hives  . Ciprofloxacin Nausea And Vomiting  . Vancomycin Rash    Possible rash reported by MD    Current Outpatient Prescriptions  Medication Sig Dispense Refill  . ALPRAZolam (XANAX) 0.5 MG tablet Take 1 or 2 three times a day for anxiety.  To fill on 08/21/12  180 tablet  1  . carvedilol (COREG) 6.25 MG tablet Take 6.25 mg by mouth daily as needed. For heart palpitations (rapid heart beat)      . citalopram (CELEXA) 20 MG tablet Take 40 mg by mouth every morning.       . gabapentin (NEURONTIN) 600 MG tablet TAKE TWO TABLETS BY MOUTH THREE TIMES DAILY  180 tablet  1  . goserelin (ZOLADEX) 10.8 MG injection Inject 10.8 mg into the skin  every 3 (three) months.      Marland Kitchen HYDROcodone-acetaminophen (NORCO) 5-325 MG per tablet Take 1 tablet by mouth every 6 (six) hours as needed for pain.  10 tablet  0  . oxyCODONE-acetaminophen (PERCOCET/ROXICET) 5-325 MG per tablet Take 1 tablet by mouth every 4 (four) hours as needed for pain.  8 tablet  0  . ranitidine (ZANTAC) 150 MG tablet Take 150 mg by mouth 2 (two) times daily as needed for heartburn.      . tamoxifen (NOLVADEX) 20 MG tablet Take 20 mg by mouth every evening.       No current facility-administered medications for this visit.   Facility-Administered Medications Ordered in Other Visits  Medication Dose Route Frequency Provider Last Rate Last Dose  . bupivacaine (MARCAINE) 0.5 % 15 mL, phenazopyridine (PYRIDIUM) 400 mg bladder mixture   Bladder Instillation Once Martina Sinner, MD       OBJECTIVE: Young white female who was tearful during today's visit Filed Vitals:   10/11/12 1500  BP: 132/92  Pulse: 65  Temp: 98.2 F (36.8 C)  Resp: 20  ECOG: 1 Filed Weights   10/11/12 1500  Weight: 139 lb 14.4 oz (63.458 kg)   Physical Exam: HEENT:  Sclerae anicteric.  Oropharynx clear.  Nodes:  No cervical or supraclavicular lymphadenopathy  Breast Exam: Status post bilateral mastectomies. No evidence of local recurrence. Both axillae are clear.  Port is intact in the chest wall with no erythema or edema noted. Lungs:  Clear to auscultation bilaterally.   Heart:  Regular rate and rhythm.   Abdomen:  Soft, nontender.  Positive bowel sounds.  Musculoskeletal:  No focal spinal tenderness Extremities:  No peripheral edema  Neuro:  Nonfocal, well oriented   LAB RESULTS: Lab Results  Component Value Date   WBC 7.9 10/11/2012   NEUTROABS 4.7 10/11/2012   HGB 15.1 10/11/2012   HCT 43.2 10/11/2012   MCV 94.3 10/11/2012   PLT 191 10/11/2012      Chemistry      Component Value Date/Time   NA 141 10/11/2012 1441   NA 138 08/22/2012 1700   K 4.0 10/11/2012 1441   K 3.7  08/22/2012 1700   CL 105 10/11/2012 1441   CL 103 08/22/2012 1700   CO2 26 10/11/2012 1441   CO2 23 08/22/2012 1700   BUN 17.9 10/11/2012 1441   BUN 11 08/22/2012 1700   CREATININE 1.0 10/11/2012  1441   CREATININE 0.96 08/22/2012 1700      Component Value Date/Time   CALCIUM 9.1 10/11/2012 1441   CALCIUM 9.1 08/22/2012 1700   ALKPHOS 90 10/11/2012 1441   ALKPHOS 87 08/22/2012 1700   AST 23 10/11/2012 1441   AST 33 08/22/2012 1700   ALT 26 10/11/2012 1441   ALT 24 08/22/2012 1700   BILITOT 0.38 10/11/2012 1441   BILITOT 0.2* 08/22/2012 1700       Lab Results  Component Value Date   LABCA2 16 08/24/2011     STUDIES: No results found.   ASSESSMENT: 37 y.o.  [place] woman, known to be BRCA1 and 2 negative,   (1) status post bilateral mastectomies May of 2012 for a right-sided T1c N1 (stage II) grade 3 invasive ductal carcinoma,estrogen and progesterone receptor positive, HER-2 amplified, with an MIB-1-1 of 85%.   (2) status post carboplatin/docetaxol/trastuzumab x6, completed mid December 2012   (3) on trastuzumab every 3 weeks, started August of 2012, interrupted between April and August of 2013, completed 04/27/2012. Most recent echo 03/19/2012  (4) status post radiation therapy, completed 07/12/2011  (5) On tamoxifen since April 2013   (6) On goserelin given every 3 months, 1st injection on 10/26/11, most recent dose 10/11/2012.   (7) port flush every 6 weeks  PLAN:  From a breast cancer point of view, Prachi is doing well, tolerating the goserelin and tamoxifen with hot flashes as the main complication. We talked about venlafaxine, but she is a ready on Celexa and she has had so much trouble with antidepressants in the past I really would not want to give her a mixture. [name] is concerned about they're all call intake and we discussed some strategies they could use to decrease fat, such as going out for a walk after her 1 drink a, or buying just enough for the week and not buying  anymore for a whole week. I also gave Afreen a pamphlet on the live strong program. I think he would be helpful if she could exercise regularly.  Otherwise she will see me again in 6 months. We will continue to flush her port every 6 weeks and to give her the goserelin shot every 3 months. They know to call for any problems that may develop before the next visit.   Calixto Pavel C    10/11/2012

## 2012-10-15 ENCOUNTER — Telehealth: Payer: Self-pay | Admitting: *Deleted

## 2012-10-15 NOTE — Telephone Encounter (Signed)
Lm informing the pt. That GCM would like for her to come in every 6weeks for an flush. i also made her aware that within the next 12 and 24 weeks she will be scheduled for zoladex. i emailed MW to add this tx. Pt is aware that she is scheduled to see GCM in Nov. I also mailed a scheduled as well...td

## 2012-10-15 NOTE — Telephone Encounter (Signed)
Per staff message and POF I have scheduled appts.  JMW  

## 2012-10-19 ENCOUNTER — Telehealth: Payer: Self-pay

## 2012-10-19 DIAGNOSIS — F411 Generalized anxiety disorder: Secondary | ICD-10-CM

## 2012-10-19 MED ORDER — ALPRAZOLAM 0.5 MG PO TABS: ORAL_TABLET | ORAL | Status: DC

## 2012-10-19 NOTE — Telephone Encounter (Signed)
Pt would like to know when the alprazolam has been called into the pharmacy. Please contact pt at  [phone].

## 2012-10-19 NOTE — Telephone Encounter (Signed)
[facility] requests refill on Alprazolam 0.5 mg.

## 2012-10-23 ENCOUNTER — Telehealth: Payer: Self-pay | Admitting: Radiology

## 2012-10-23 NOTE — Telephone Encounter (Signed)
Called the pharmacy- this was filled 4 days ago.  Can disregard fax.

## 2012-10-23 NOTE — Telephone Encounter (Signed)
Please advise on faxed request for Alprazolam

## 2012-11-15 ENCOUNTER — Ambulatory Visit (HOSPITAL_BASED_OUTPATIENT_CLINIC_OR_DEPARTMENT_OTHER): Payer: Self-pay

## 2012-11-15 VITALS — BP 123/89 | HR 87 | Temp 97.9°F | Resp 18

## 2012-11-15 DIAGNOSIS — C50919 Malignant neoplasm of unspecified site of unspecified female breast: Secondary | ICD-10-CM

## 2012-11-15 DIAGNOSIS — C50911 Malignant neoplasm of unspecified site of right female breast: Secondary | ICD-10-CM

## 2012-11-15 DIAGNOSIS — Z452 Encounter for adjustment and management of vascular access device: Secondary | ICD-10-CM

## 2012-11-15 DIAGNOSIS — C773 Secondary and unspecified malignant neoplasm of axilla and upper limb lymph nodes: Secondary | ICD-10-CM

## 2012-11-15 MED ORDER — SODIUM CHLORIDE 0.9 % IJ SOLN
10.0000 mL | INTRAMUSCULAR | Status: DC | PRN
  Administered 2012-11-15: 10 mL via INTRAVENOUS
  Filled 2012-11-15: qty 10

## 2012-11-15 MED ORDER — HEPARIN SOD (PORK) LOCK FLUSH 100 UNIT/ML IV SOLN
500.0000 [IU] | Freq: Once | INTRAVENOUS | Status: AC
  Administered 2012-11-15: 500 [IU] via INTRAVENOUS
  Filled 2012-11-15: qty 5

## 2012-11-15 NOTE — Patient Instructions (Signed)
Call MD for problems 

## 2012-11-19 ENCOUNTER — Telehealth: Payer: Self-pay

## 2012-11-19 DIAGNOSIS — F411 Generalized anxiety disorder: Secondary | ICD-10-CM

## 2012-11-19 MED ORDER — ALPRAZOLAM 0.5 MG PO TABS: ORAL_TABLET | ORAL | Status: DC

## 2012-11-19 NOTE — Telephone Encounter (Signed)
Patient calling to ask if we have received a fax requesting a refill for her alprazolam.    [phone]

## 2012-11-19 NOTE — Telephone Encounter (Signed)
Called in 90  

## 2012-11-19 NOTE — Telephone Encounter (Signed)
Received call from pharmacy checking status of this. We have not received request previously. Dr Patsy Lager, do you want to RF?

## 2012-11-21 NOTE — Telephone Encounter (Signed)
lmom that rx was sent in

## 2012-11-23 ENCOUNTER — Other Ambulatory Visit: Payer: Self-pay | Admitting: Radiology

## 2012-12-06 ENCOUNTER — Telehealth: Payer: Self-pay | Admitting: *Deleted

## 2012-12-06 NOTE — Telephone Encounter (Signed)
Pt called stating that she need an appt w/ AGB on 12/27/12 to discuss some issues. gv an appt for 11:45am ov and moved her flush appt to 12:30pm. Pt is aware...td

## 2012-12-11 ENCOUNTER — Other Ambulatory Visit: Payer: Self-pay | Admitting: Family Medicine

## 2012-12-13 ENCOUNTER — Ambulatory Visit: Payer: Self-pay | Admitting: Family Medicine

## 2012-12-13 VITALS — BP 120/82 | HR 94 | Temp 98.0°F | Resp 18 | Ht 65.0 in | Wt 138.0 lb

## 2012-12-13 DIAGNOSIS — F411 Generalized anxiety disorder: Secondary | ICD-10-CM

## 2012-12-13 MED ORDER — ALPRAZOLAM 1 MG PO TABS: ORAL_TABLET | ORAL | Status: DC

## 2012-12-13 NOTE — Progress Notes (Signed)
[facility] [address] [place] [address] [phone]  Date:  12/13/2012   Name:  Crystal [name]   DOB:  1975-05-31   MRN:  [id number]  PCP:  [name], MD    Chief Complaint: rx refills   History of Present Illness:  Crystal [name] is a 37 y.o. very pleasant female patient who presents with the following:  History of breast cancer (2012), anxiety, migraine disorder.  She needs a refill of her xanax.  She is taking two of her xanax 0.5 mg TID on a regular basis.  She states that this dose is necessary to control her anxiety.  She has tried taking less but did not feel well.  She has been taking 1mg  TID for several months now She is off of her pain medications now, and takes zantac prn.      She has noted some discomfort under her arms- she is worried that this could be lymph nodes/ mets.  She will sometimes note pain in her bones of her arms and legs. understandably she worries that her cancer could have returned.   Her son is having some life issues- he failed out of school and is now living with his father.  This is adding to her stressors.    Patient Active Problem List   Diagnosis Date Noted  . Chronic female pelvic pain 08/21/2012  . Cramps, muscle, general 08/20/2012  . Fatigue 08/20/2012  . Palpitations 01/18/2012  . Cough 11/07/2011  . Chemotherapy induced cardiomyopathy 09/08/2011  . Syncope 09/08/2011  . Diarrhea 09/08/2011  . Generalized convulsive seizure 07/13/2011  . Seizure disorder 07/13/2011  . Migraine 07/13/2011  . Anxiety disorder 07/13/2011  . Depression 07/13/2011  . Tachycardia - pulse 05/26/2011  . Hx MRSA infection 04/13/2011  . Anemia associated with chemotherapy 03/29/2011  . Cellulitis and abscess 03/27/2011  . Seizure 03/27/2011  . Breast cancer 11/25/2010  . NAUSEA AND VOMITING 01/28/2009  . ABDOMINAL PAIN, RIGHT LOWER QUADRANT 01/28/2009    Past Medical History  Diagnosis Date  . Status post  chemotherapy     docetaxel/carboplatin/trastuzumab  . Anxiety   . Hx MRSA infection 03/28/11    Skin  . History of breast cancer DX JUNE 2012 W/ RIGHT BREAST INVASIVE DUCTAL CA  STAGE IIB---  S/P BILATERAL MASTECTOMIES AND RIGHT NODE DISSECTION    ONCOLOGIST-- DR [name]--  CHEMO ENDED DEC 2012 / RADIATION ENDED MAR 2013--  CURRENT ON TAMOXIFEN AND ZOLADEX  . Family history of anesthesia complication     MOTHER PONV  . Hot flashes due to tamoxifen   . History of idiopathic seizure     DX 2008 --  LAST ONE 2012--  NO ISSUES SINCE AND NO MEDS. -- PT STATES DOCTORS FELT IT WAS STRESS RELATED DUE TO HEALTH ISSUES  . Bipolar affective disorder, mixed   . GERD (gastroesophageal reflux disease)     OCCASIONALLY TAKE ZANTAC  . Left ovarian cyst   . Right lower quadrant abdominal pain   . Frequency of urination   . Urgency of urination   . Strains to urinate   . Nocturia   . Heart palpitations   . Cancer   . Depression   . Seizures     Past Surgical History  Procedure Laterality Date  . Wisdom tooth extraction    . Biopsy breast  10/14/2010    right needle core biopsy  . Portacath placement  11/16/2010    placement of left  subclavian port  . I&d extremity  09/26/2011    Procedure: IRRIGATION AND DEBRIDEMENT EXTREMITY;  Surgeon: [name] Ribas, MD;  Location: [facility];  Service: Orthopedics;  Laterality: Right;  I&D right hand cat bite wound  . Breast surgery  11/16/2010    bilateral mastectomy+ right axillary node dissection,T1cN1a, Her2+,ERPR+  (right breast invasive ductal carcinoma)  . Transthoracic echocardiogram  05-28-2012    LVF NORMAL/  EF 55-60%  . Cysto with hydrodistension N/A 07/23/2012    Procedure: CYSTOSCOPY/HYDRODISTENSION instillation of marcaine and pyridium;  Surgeon: Martina Sinner, MD;  Location: [facility];  Service: Urology;  Laterality: N/A;  INSTILLATION      History  Substance Use Topics  . Smoking status: Current Every Day Smoker -- 1.00  packs/day for 20 years    Types: Cigarettes  . Smokeless tobacco: Never Used  . Alcohol Use: Yes     Comment: occa    Family History  Problem Relation Age of Onset  . Cancer Maternal Grandfather   . Mental illness Brother   . Mental illness Son     Allergies  Allergen Reactions  . Tramadol Other (See Comments)    Seizures  . Phenytoin Nausea And Vomiting  . Thorazine [Chlorpromazine Hcl] Hives  . Ciprofloxacin Nausea And Vomiting  . Vancomycin Rash    Possible rash reported by MD    Medication list has been reviewed and updated.  Current Outpatient Prescriptions on File Prior to Visit  Medication Sig Dispense Refill  . ALPRAZolam (XANAX) 0.5 MG tablet Take 1 or 2 three times a day for anxiety.  90 tablet  0  . carvedilol (COREG) 6.25 MG tablet Take 6.25 mg by mouth daily as needed. For heart palpitations (rapid heart beat)      . citalopram (CELEXA) 20 MG tablet Take 2 tablets by mouth daily. Take one tablet a day then may increase to two tablets after one week.  60 tablet  1  . goserelin (ZOLADEX) 10.8 MG injection Inject 10.8 mg into the skin every 3 (three) months.      . ranitidine (ZANTAC) 150 MG tablet Take 150 mg by mouth 2 (two) times daily as needed for heartburn.      . tamoxifen (NOLVADEX) 20 MG tablet Take 20 mg by mouth every evening.      . gabapentin (NEURONTIN) 600 MG tablet TAKE TWO TABLETS BY MOUTH THREE TIMES DAILY  180 tablet  1  . HYDROcodone-acetaminophen (NORCO) 5-325 MG per tablet Take 1 tablet by mouth every 6 (six) hours as needed for pain.  10 tablet  0  . oxyCODONE-acetaminophen (PERCOCET/ROXICET) 5-325 MG per tablet Take 1 tablet by mouth every 4 (four) hours as needed for pain.  8 tablet  0   Current Facility-Administered Medications on File Prior to Visit  Medication Dose Route Frequency Provider Last Rate Last Dose  . bupivacaine (MARCAINE) 0.5 % 15 mL, phenazopyridine (PYRIDIUM) 400 mg bladder mixture   Bladder Instillation Once Martina Sinner, MD        Review of Systems:  As per HPI- otherwise negative.   Physical Examination: Filed Vitals:   12/13/12 1456  BP: 120/82  Pulse: 94  Temp: 98 F (36.7 C)  Resp: 18   Filed Vitals:   12/13/12 1456  Height: 5\' 5"  (1.651 m)  Weight: 138 lb (62.596 kg)   Body mass index is 22.96 kg/(m^2). Ideal Body Weight: Weight in (lb) to have BMI = 25: 149.9  GEN:  WDWN, NAD, Non-toxic, A & O x 3, looks well, s/p bilateral mastectomy HEENT: Atraumatic, Normocephalic. Neck supple. No masses, No LAD. Ears and Nose: No external deformity. CV: RRR, No M/G/R. No JVD. No thrill. No extra heart sounds. PULM: CTA B, no wheezes, crackles, rhonchi. No retractions. No resp. distress. No accessory muscle use. ABD: S, NT, ND. No rebound. No HSM. EXTR: No c/c/e NEURO Normal gait.  PSYCH: Normally interactive. Conversant. Not depressed or anxious appearing.  Calm demeanor.  No cervical or axillary lymphadenopathy.  No palpable abnormality of her limbs    Assessment and Plan: Anxiety state, unspecified - Plan: ALPRAZolam (XANAX) 1 MG tablet  Refilled her xanax- changed to 1 mg TID instead of 2 0.5 TID for convenience.  Would like to have her taper off this medication but she does not yet feel that she is able.  Encouraged her to discuss her other concerns at her next oncology appt which is later this month.    Signed [name], MD

## 2012-12-13 NOTE — Patient Instructions (Addendum)
Please let me know if you have any problems.  Please give me a call when you are running out of refills.  Discuss your concerns with your oncology nurse- they may want to do a bone scan.

## 2012-12-14 ENCOUNTER — Telehealth: Payer: Self-pay

## 2012-12-14 NOTE — Telephone Encounter (Signed)
Forwarding to Dr Patsy Lager

## 2012-12-14 NOTE — Telephone Encounter (Signed)
Patient requests to speak to Dr. Patsy Lager only and not her nurse or CMA. She says its in regards to a broken nose; she told me she only wants to explain to Dr. Patsy Lager (not to me). She told me its kind of emergent. Patient was advised that it might be awhile if she calls back because of the high volume of patients being seen today. She understood.   Best number: [phone]

## 2012-12-14 NOTE — Telephone Encounter (Signed)
Called her back- she states that she fell when at a restaurant last night.  She hit her face and apparently broke her nose.  EMS came to the scene per her report and told her that her nose is broken.  She took some percocet from a friend which did seem to help "a little bit."  She would like for me to prescribe a narcotic pain medication.  Let her know that I am sorry but I cannot do this without evaluating her injury.  If she does not want to come in I would suggest ice and OTC pain relievers.  Her nose will probably feel better in a couple of days.  If she needs stronger medications she does need to come in.  She states understanding.

## 2012-12-17 ENCOUNTER — Telehealth: Payer: Self-pay

## 2012-12-17 ENCOUNTER — Encounter: Payer: Self-pay | Admitting: Family Medicine

## 2012-12-17 ENCOUNTER — Ambulatory Visit: Payer: Self-pay | Admitting: Family Medicine

## 2012-12-17 VITALS — BP 120/64 | HR 64 | Temp 98.7°F | Resp 18 | Ht 65.0 in | Wt 140.0 lb

## 2012-12-17 DIAGNOSIS — S022XXA Fracture of nasal bones, initial encounter for closed fracture: Secondary | ICD-10-CM

## 2012-12-17 MED ORDER — OXYCODONE-ACETAMINOPHEN 2.5-325 MG PO TABS: 1.0000 | ORAL_TABLET | Freq: Three times a day (TID) | ORAL | Status: DC | PRN

## 2012-12-17 MED ORDER — OXYCODONE-ACETAMINOPHEN 5-300 MG PO TABS: 1.0000 | ORAL_TABLET | Freq: Three times a day (TID) | ORAL | Status: DC | PRN

## 2012-12-17 MED ORDER — OXYCODONE-ACETAMINOPHEN 5-325 MG PO TABS
1.0000 | ORAL_TABLET | Freq: Three times a day (TID) | ORAL | Status: DC | PRN
Start: 2012-12-17 — End: 2013-01-15

## 2012-12-17 NOTE — Telephone Encounter (Signed)
Pt called and stated she is on her way back because the Rx for oxycodone that Dr Patsy Lager wrote this morning is not available and needs to get it changed. Called [facility] and was advised they do not have the 5/300, but do have plenty of the 5/325 mg. I am rewriting the Rx for Dr Copland's review and signature.

## 2012-12-17 NOTE — Patient Instructions (Addendum)
Continue to apply ice to your nose.  Use the percocet as needed- however remember it can make you sleepy.  You might want to try decreasing your xanax rx while you are on the percocet.

## 2012-12-17 NOTE — Progress Notes (Signed)
[facility] [address] [place] [address] [phone]  Date:  12/17/2012   Name:  Crystal [name]   DOB:  1976-02-06   MRN:  [id number]  PCP:  [name], MD    Chief Complaint: broken nose   History of Present Illness:  Crystal [name] is a 37 y.o. very pleasant female patient who presents with the following:  Here to evaluate a possible broken nose.  She had called me on Friday because she had fallen at a restaurant the night before and hit her nose.  Phone note:   Called her back- she states that she fell when at a restaurant last night. She hit her face and apparently broke her nose. EMS came to the scene per her report and told her that her nose is broken. She took some percocet from a friend which did seem to help "a little bit." She would like for me to prescribe a narcotic pain medication. Let her know that I am sorry but I cannot do this without evaluating her injury. If she does not want to come in I would suggest ice and OTC pain relievers. Her nose will probably feel better in a couple of days. If she needs stronger medications she does need to come in. She states understanding.   She notes that her nose is still very sore.  She is also bruised on her knees. She does not want to have any x-rays due to cost.   She did not have any LOC.  Her nose did bleed quite a bit.  She is able to breathe out of both sides or her nose.  Her nose is swollen somewhat, but she does not think she will be interested in having surgery to correct it.   She had used some percocet 5mg - this did seem to help. She has used percoet during her chemoherapy as well without any problems.  She would like for me to give her 10 mg tablets Patient Active Problem List   Diagnosis Date Noted  . Chronic female pelvic pain 08/21/2012  . Cramps, muscle, general 08/20/2012  . Fatigue 08/20/2012  . Palpitations 01/18/2012  . Cough 11/07/2011  . Chemotherapy induced cardiomyopathy  09/08/2011  . Syncope 09/08/2011  . Diarrhea 09/08/2011  . Generalized convulsive seizure 07/13/2011  . Seizure disorder 07/13/2011  . Migraine 07/13/2011  . Anxiety disorder 07/13/2011  . Depression 07/13/2011  . Tachycardia - pulse 05/26/2011  . Hx MRSA infection 04/13/2011  . Anemia associated with chemotherapy 03/29/2011  . Cellulitis and abscess 03/27/2011  . Seizure 03/27/2011  . Breast cancer 11/25/2010  . NAUSEA AND VOMITING 01/28/2009  . ABDOMINAL PAIN, RIGHT LOWER QUADRANT 01/28/2009    Past Medical History  Diagnosis Date  . Status post chemotherapy     docetaxel/carboplatin/trastuzumab  . Anxiety   . Hx MRSA infection 03/28/11    Skin  . History of breast cancer DX JUNE 2012 W/ RIGHT BREAST INVASIVE DUCTAL CA  STAGE IIB---  S/P BILATERAL MASTECTOMIES AND RIGHT NODE DISSECTION    ONCOLOGIST-- DR [name]--  CHEMO ENDED DEC 2012 / RADIATION ENDED MAR 2013--  CURRENT ON TAMOXIFEN AND ZOLADEX  . Family history of anesthesia complication     MOTHER PONV  . Hot flashes due to tamoxifen   . History of idiopathic seizure     DX 2008 --  LAST ONE 2012--  NO ISSUES SINCE AND NO MEDS. -- PT STATES DOCTORS FELT IT WAS STRESS RELATED DUE  TO HEALTH ISSUES  . Bipolar affective disorder, mixed   . GERD (gastroesophageal reflux disease)     OCCASIONALLY TAKE ZANTAC  . Left ovarian cyst   . Right lower quadrant abdominal pain   . Frequency of urination   . Urgency of urination   . Strains to urinate   . Nocturia   . Heart palpitations   . Cancer   . Depression   . Seizures     Past Surgical History  Procedure Laterality Date  . Wisdom tooth extraction    . Biopsy breast  10/14/2010    right needle core biopsy  . Portacath placement  11/16/2010    placement of left subclavian port  . I&d extremity  09/26/2011    Procedure: IRRIGATION AND DEBRIDEMENT EXTREMITY;  Surgeon: [name] Ribas, MD;  Location: [facility];  Service: Orthopedics;  Laterality: Right;  I&D right hand cat  bite wound  . Breast surgery  11/16/2010    bilateral mastectomy+ right axillary node dissection,T1cN1a, Her2+,ERPR+  (right breast invasive ductal carcinoma)  . Transthoracic echocardiogram  05-28-2012    LVF NORMAL/  EF 55-60%  . Cysto with hydrodistension N/A 07/23/2012    Procedure: CYSTOSCOPY/HYDRODISTENSION instillation of marcaine and pyridium;  Surgeon: Martina Sinner, MD;  Location: [facility];  Service: Urology;  Laterality: N/A;  INSTILLATION      History  Substance Use Topics  . Smoking status: Current Every Day Smoker -- 1.00 packs/day for 20 years    Types: Cigarettes  . Smokeless tobacco: Never Used  . Alcohol Use: Yes     Comment: occa    Family History  Problem Relation Age of Onset  . Cancer Maternal Grandfather   . Mental illness Brother   . Mental illness Son     Allergies  Allergen Reactions  . Tramadol Other (See Comments)    Seizures  . Phenytoin Nausea And Vomiting  . Thorazine [Chlorpromazine Hcl] Hives  . Ciprofloxacin Nausea And Vomiting  . Vancomycin Rash    Possible rash reported by MD    Medication list has been reviewed and updated.  Current Outpatient Prescriptions on File Prior to Visit  Medication Sig Dispense Refill  . ALPRAZolam (XANAX) 1 MG tablet Take 1 three times a day for anxiety.  90 tablet  4  . carvedilol (COREG) 6.25 MG tablet Take 6.25 mg by mouth daily as needed. For heart palpitations (rapid heart beat)      . citalopram (CELEXA) 20 MG tablet Take 2 tablets by mouth daily. Take one tablet a day then may increase to two tablets after one week.  60 tablet  1  . goserelin (ZOLADEX) 10.8 MG injection Inject 10.8 mg into the skin every 3 (three) months.      . ranitidine (ZANTAC) 150 MG tablet Take 150 mg by mouth 2 (two) times daily as needed for heartburn.      . tamoxifen (NOLVADEX) 20 MG tablet Take 20 mg by mouth every evening.       Current Facility-Administered Medications on File Prior to Visit   Medication Dose Route Frequency Provider Last Rate Last Dose  . bupivacaine (MARCAINE) 0.5 % 15 mL, phenazopyridine (PYRIDIUM) 400 mg bladder mixture   Bladder Instillation Once Martina Sinner, MD        Review of Systems:  As per HPI- otherwise negative.   Physical Examination: Filed Vitals:   12/17/12 1052  BP: 120/64  Pulse: 64  Temp: 98.7 F (37.1 C)  Resp: 18   Filed Vitals:   12/17/12 1052  Height: 5\' 5"  (1.651 m)  Weight: 140 lb (63.504 kg)   Body mass index is 23.3 kg/(m^2). Ideal Body Weight: Weight in (lb) to have BMI = 25: 149.9  GEN: WDWN, NAD, Non-toxic, A & O x 3 HEENT: Atraumatic, Normocephalic. Neck supple. No masses, No LAD.  Bilateral TM wnl, oropharynx normal.  PEERL,EOMI.  Nasal contusion evident.  No black eyes.  No tenderness of facial bones except for nasal bone.   Ears and Nose: No external deformity. CV: RRR, No M/G/R. No JVD. No thrill. No extra heart sounds. PULM: CTA B, no wheezes, crackles, rhonchi. No retractions. No resp. distress. No accessory muscle use. EXTR: No c/c/e NEURO Normal gait.  PSYCH: Normally interactive. Conversant. Not depressed or anxious appearing.  Calm demeanor.  Nose:  No septal hematoma.  Nose is slightly swollen and tender over bridge.  No gross deformity.  No active bleeding.   Legs: bruises and scrapes over knees bilaterally  Assessment and Plan: Closed fracture nasal bone, initial encounter - Plan: DISCONTINUED: oxycodone-acetaminophen (LYNOX) 5-300 MG per tablet   Pain from nasal fracture.  Declines x-rays today.  Did give her a small supply of percocet 5/325.  Explained that I did not think 10mg  tablets are necessary, and could cause excessive sedation.  Cautioned her to decrease her xanax while on this medication.    Signed [name], MD

## 2012-12-27 ENCOUNTER — Ambulatory Visit: Payer: Self-pay | Admitting: Pharmacist

## 2012-12-27 ENCOUNTER — Ambulatory Visit (HOSPITAL_BASED_OUTPATIENT_CLINIC_OR_DEPARTMENT_OTHER): Payer: Self-pay | Admitting: Physician Assistant

## 2012-12-27 ENCOUNTER — Other Ambulatory Visit: Payer: Self-pay | Admitting: Oncology

## 2012-12-27 ENCOUNTER — Encounter: Payer: Self-pay | Admitting: Oncology

## 2012-12-27 ENCOUNTER — Encounter: Payer: Self-pay | Admitting: Physician Assistant

## 2012-12-27 ENCOUNTER — Other Ambulatory Visit (HOSPITAL_COMMUNITY): Payer: Self-pay | Admitting: Oncology

## 2012-12-27 ENCOUNTER — Telehealth: Payer: Self-pay | Admitting: *Deleted

## 2012-12-27 ENCOUNTER — Ambulatory Visit (HOSPITAL_BASED_OUTPATIENT_CLINIC_OR_DEPARTMENT_OTHER): Payer: Self-pay

## 2012-12-27 ENCOUNTER — Ambulatory Visit (HOSPITAL_COMMUNITY)
Admission: RE | Admit: 2012-12-27 | Discharge: 2012-12-27 | Disposition: A | Discharge status: Home / Self Care | Payer: Self-pay | Source: Ambulatory Visit | Attending: Oncology | Admitting: Oncology

## 2012-12-27 ENCOUNTER — Ambulatory Visit (HOSPITAL_BASED_OUTPATIENT_CLINIC_OR_DEPARTMENT_OTHER): Payer: Self-pay | Admitting: Lab

## 2012-12-27 ENCOUNTER — Other Ambulatory Visit: Payer: Self-pay | Admitting: Pharmacist

## 2012-12-27 VITALS — BP 127/88 | HR 92 | Temp 98.8°F | Resp 20 | Ht 65.0 in | Wt 141.0 lb

## 2012-12-27 DIAGNOSIS — M792 Neuralgia and neuritis, unspecified: Secondary | ICD-10-CM

## 2012-12-27 DIAGNOSIS — C50911 Malignant neoplasm of unspecified site of right female breast: Secondary | ICD-10-CM | POA: Insufficient documentation

## 2012-12-27 DIAGNOSIS — I82409 Acute embolism and thrombosis of unspecified deep veins of unspecified lower extremity: Secondary | ICD-10-CM

## 2012-12-27 DIAGNOSIS — R600 Localized edema: Secondary | ICD-10-CM

## 2012-12-27 DIAGNOSIS — C50919 Malignant neoplasm of unspecified site of unspecified female breast: Secondary | ICD-10-CM

## 2012-12-27 DIAGNOSIS — M79609 Pain in unspecified limb: Secondary | ICD-10-CM | POA: Insufficient documentation

## 2012-12-27 DIAGNOSIS — I82402 Acute embolism and thrombosis of unspecified deep veins of left lower extremity: Secondary | ICD-10-CM

## 2012-12-27 DIAGNOSIS — R609 Edema, unspecified: Secondary | ICD-10-CM | POA: Insufficient documentation

## 2012-12-27 DIAGNOSIS — M7989 Other specified soft tissue disorders: Secondary | ICD-10-CM

## 2012-12-27 DIAGNOSIS — Z853 Personal history of malignant neoplasm of breast: Secondary | ICD-10-CM

## 2012-12-27 DIAGNOSIS — IMO0002 Reserved for concepts with insufficient information to code with codable children: Secondary | ICD-10-CM

## 2012-12-27 LAB — HEPARIN ANTI-XA: Heparin LMW: <0.1 IU/mL

## 2012-12-27 LAB — PROTIME-INR

## 2012-12-27 MED ORDER — GABAPENTIN 600 MG PO TABS: 1200.0000 mg | ORAL_TABLET | Freq: Three times a day (TID) | ORAL | Status: DC

## 2012-12-27 MED ORDER — SODIUM CHLORIDE 0.9 % IJ SOLN
10.0000 mL | INTRAMUSCULAR | Status: DC | PRN
  Administered 2012-12-27: 10 mL via INTRAVENOUS
  Filled 2012-12-27: qty 10

## 2012-12-27 MED ORDER — WARFARIN SODIUM 5 MG PO TABS: 5.0000 mg | ORAL_TABLET | Freq: Every day | ORAL | Status: DC

## 2012-12-27 MED ORDER — HEPARIN SOD (PORK) LOCK FLUSH 100 UNIT/ML IV SOLN
500.0000 [IU] | Freq: Once | INTRAVENOUS | Status: AC
  Administered 2012-12-27: 500 [IU] via INTRAVENOUS
  Filled 2012-12-27: qty 5

## 2012-12-27 MED ORDER — ENOXAPARIN SODIUM 100 MG/ML ~~LOC~~ SOLN
100.0000 mg | SUBCUTANEOUS | Status: DC
  Administered 2012-12-27: 100 mg via SUBCUTANEOUS
  Filled 2012-12-27: qty 1

## 2012-12-27 NOTE — Progress Notes (Signed)
ID: Crystal [name]   DOB: 09/26/1975  MR#: [id number]  WUJ#:[id number]  NFA:OZHYQMV,HQIONGE, MD GYN: SURoyston Sinner, MD OTHER:  [name], MD    HISTORY OF PRESENT ILLNESS: The patient is a 37 year old [place] woman who noted a mass in her right breast and brought it to her primary care physician's attention June of 2012. She was set up for mammography at [facility], where an area of calcifications highly suspicious for carcinoma was biopsied. This was in the upper outer quadrant and it measured 1.7 cm mammographically and 1.8 cm by ultrasonography. The pathology report ([id number]) showed a high-grade invasive ductal carcinoma estrogen receptor positive at 64% progesterone receptor positive at 18%, and HER-2 amplified with a ratio by CISH of 5.24. MIB-1-1 was 65%. A second mass biopsied at the same time was also triple positive.  Given her multiple masses in the right breast, mastectomy was recommended. The patient actually opted for bilateral mastectomies and this was performed together with a right axillary lymph node dissection under Dr. Felicity Pellegrini on 11/16/2010. The final pathology [id number]) showed, on the left, no evidence of cancer. On the right the patient had a high-grade invasive ductal carcinoma measuring 1.9 cm, with negative margins, grade 3, involving 2 of 16 lymph nodes (stage IIB).  Her subsequent history is as detailed below.  INTERVAL HISTORY:  Crystal [name] returns alone today in between routine followup visits. She continues to be followed here for her history of right breast cancer, and continues on tamoxifen and goserelin injections.  [name] is here today for evaluation of axillary swelling/pain in addition to bony pain which is chronic. She's noticed increased discomfort in the left axilla over the last few weeks, and occasionally has pain in the right axilla as well. She does have a port in the left upper chest wall, and is having it flushed every 6 weeks. The bony pain tends  to be localized in the forearms and the lower legs bilaterally. I will mention that she has had no swelling in the lower extremities.   REVIEW OF SYSTEMS: Crystal [name] has had no fevers or chills. She continues to have anxiety and depression which are chronic. She also continues to have chronic headaches but denies any changes. She denies any dizziness. She recently fell and broke her nose, and this was followed by Dr. [name]. [name] energy level is low. She continues to have hot flashes associated with the tamoxifen.  A detailed review of systems is otherwise stable and noncontributory.   PAST MEDICAL HISTORY: Past Medical History  Diagnosis Date  . Status post chemotherapy     docetaxel/carboplatin/trastuzumab  . Anxiety   . Hx MRSA infection 03/28/11    Skin  . History of breast cancer DX JUNE 2012 W/ RIGHT BREAST INVASIVE DUCTAL CA  STAGE IIB---  S/P BILATERAL MASTECTOMIES AND RIGHT NODE DISSECTION    ONCOLOGIST-- DR [name]--  CHEMO ENDED DEC 2012 / RADIATION ENDED MAR 2013--  CURRENT ON TAMOXIFEN AND ZOLADEX  . Family history of anesthesia complication     MOTHER PONV  . Hot flashes due to tamoxifen   . History of idiopathic seizure     DX 2008 --  LAST ONE 2012--  NO ISSUES SINCE AND NO MEDS. -- PT STATES DOCTORS FELT IT WAS STRESS RELATED DUE TO HEALTH ISSUES  . Bipolar affective disorder, mixed   . GERD (gastroesophageal reflux disease)     OCCASIONALLY TAKE ZANTAC  . Left ovarian cyst   . Right lower quadrant abdominal  pain   . Frequency of urination   . Urgency of urination   . Strains to urinate   . Nocturia   . Heart palpitations   . Cancer   . Depression   . Seizures   1. History of seizures. The patient has been thoroughly evaluated both here by Dr. [name] and at [facility] for this. Among many studies, she had an MRI of the brain on August 28th. This showed an incidental solitary small lesion in the left frontoparietal white matter. It was not felt to be  suggestive of multiple sclerosis. A noncontrasted CT in August 2010 was negative. The patient has been off all seizure medications now for 2 months. There has been no evidence of seizure recurrence. 2. Complex psychology history (please refer to Dr. [name] note in the E-chart from August 29, 2009) with evidence of anxiety disorder, depression and psychosis. The patient is currently on no medications for this and denies any of these symptoms at present. 3. History of migraines. 4. Remote history of multidrug abuse. 5. Status post wisdom teeth extraction.  History of MRSA skin infection   PAST SURGICAL HISTORY: Past Surgical History  Procedure Laterality Date  . Wisdom tooth extraction    . Biopsy breast  10/14/2010    right needle core biopsy  . Portacath placement  11/16/2010    placement of left subclavian port  . I&d extremity  09/26/2011    Procedure: IRRIGATION AND DEBRIDEMENT EXTREMITY;  Surgeon: [name] Ribas, MD;  Location: [facility];  Service: Orthopedics;  Laterality: Right;  I&D right hand cat bite wound  . Breast surgery  11/16/2010    bilateral mastectomy+ right axillary node dissection,T1cN1a, Her2+,ERPR+  (right breast invasive ductal carcinoma)  . Transthoracic echocardiogram  05-28-2012    LVF NORMAL/  EF 55-60%  . Cysto with hydrodistension N/A 07/23/2012    Procedure: CYSTOSCOPY/HYDRODISTENSION instillation of marcaine and pyridium;  Surgeon: Martina Sinner, MD;  Location: [facility] ;  Service: Urology;  Laterality: N/A;  INSTILLATION      FAMILY HISTORY Family History  Problem Relation Age of Onset  . Cancer Maternal Grandfather   . Mental illness Brother   . Mental illness Son   The patient's mother is living, in her mid 32s, with no history of breast or ovarian cancer. The patient does not know her biological father. The patient has an older brother with bipolar disease, and according to the patient, borderline schizophrenia.   GYNECOLOGIC  HISTORY: Menarche at age 24. She is G1, P1 with first pregnancy to term at age 74. The patient had significant pelvic pain recently and was evaluated with transvaginal and pelvic ultrasound on July 17th by Dr. Natale Milch. This showed normal uterine myometrium and endometrium with bilateral functional ovarian cysts. There were no worrisome features. She is currently on every three-month goserelin.  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 Her husband of 4 years, [name], works as a Administrator. In addition to them at home is the patient's son, [name], 75 years old attends [employer] in the 11TH grade. [name], according to the patient, has significant nausea problems. The patient attends a local [address]. [name] is pastor.    ADVANCED DIRECTIVES: Not in place  HEALTH MAINTENANCE: History  Substance Use Topics  . Smoking status: Current Every Day Smoker -- 1.00 packs/day for 20 years    Types: Cigarettes  .  Smokeless tobacco: Never Used  . Alcohol Use: Yes     Comment: occa     Colonoscopy:  PAP:  Bone density:  Lipid panel:  Allergies  Allergen Reactions  . Tramadol Other (See Comments)    Seizures  . Phenytoin Nausea And Vomiting  . Thorazine [Chlorpromazine Hcl] Hives  . Ciprofloxacin Nausea And Vomiting  . Vancomycin Rash    Possible rash reported by MD    Current Outpatient Prescriptions  Medication Sig Dispense Refill  . ALPRAZolam (XANAX) 1 MG tablet Take 1 three times a day for anxiety.  90 tablet  4  . carvedilol (COREG) 6.25 MG tablet Take 6.25 mg by mouth daily as needed. For heart palpitations (rapid heart beat)      . citalopram (CELEXA) 20 MG tablet Take 2 tablets by mouth daily. Take one tablet a day then may increase to two tablets after one week.  60 tablet  1  . gabapentin (NEURONTIN) 600 MG tablet Take 2 tablets (1,200 mg total) by mouth 3 (three) times daily.  180 tablet  1   . goserelin (ZOLADEX) 10.8 MG injection Inject 10.8 mg into the skin every 3 (three) months.      Marland Kitchen oxycodone-acetaminophen (PERCOCET) 2.5-325 MG per tablet Take 1 tablet by mouth every 8 (eight) hours as needed for pain.  15 tablet  0  . oxyCODONE-acetaminophen (ROXICET) 5-325 MG per tablet Take 1 tablet by mouth every 8 (eight) hours as needed for pain.  15 tablet  0  . ranitidine (ZANTAC) 150 MG tablet Take 150 mg by mouth 2 (two) times daily as needed for heartburn.      . tamoxifen (NOLVADEX) 20 MG tablet Take 20 mg by mouth every evening.       No current facility-administered medications for this visit.   Facility-Administered Medications Ordered in Other Visits  Medication Dose Route Frequency Provider Last Rate Last Dose  . bupivacaine (MARCAINE) 0.5 % 15 mL, phenazopyridine (PYRIDIUM) 400 mg bladder mixture   Bladder Instillation Once Martina Sinner, MD       OBJECTIVE: Young white female who appears anxious Filed Vitals:   12/27/12 1219  BP: 127/88  Pulse: 92  Temp: 98.8 F (37.1 C)  Resp: 20  ECOG: 1 Filed Weights   12/27/12 1219  Weight: 141 lb (63.957 kg)   Physical Exam: Nodes:  No cervical or supraclavicular lymphadenopathy  Breast Exam: Status post bilateral mastectomies. No evidence of local recurrence. Both axillae are clear, with no palpable adenopathy.  Port is intact in the left chest wall with no erythema. There is some increased edema in the upper left chest wall and left neck, and also in the left axilla. Lungs:  Clear to auscultation bilaterally.   Heart:  Regular rate and rhythm.   Neuro:  Nonfocal, well oriented, anxious affect   LAB RESULTS: Lab Results  Component Value Date   WBC 7.9 10/11/2012   NEUTROABS 4.7 10/11/2012   HGB 15.1 10/11/2012   HCT 43.2 10/11/2012   MCV 94.3 10/11/2012   PLT 191 10/11/2012      Chemistry      Component Value Date/Time   NA 141 10/11/2012 1441   NA 138 08/22/2012 1700   K 4.0 10/11/2012 1441   K 3.7  08/22/2012 1700   CL 105 10/11/2012 1441   CL 103 08/22/2012 1700   CO2 26 10/11/2012 1441   CO2 23 08/22/2012 1700   BUN 17.9 10/11/2012 1441   BUN 11 08/22/2012  1700   CREATININE 1.0 10/11/2012 1441   CREATININE 0.96 08/22/2012 1700      Component Value Date/Time   CALCIUM 9.1 10/11/2012 1441   CALCIUM 9.1 08/22/2012 1700   ALKPHOS 90 10/11/2012 1441   ALKPHOS 87 08/22/2012 1700   AST 23 10/11/2012 1441   AST 33 08/22/2012 1700   ALT 26 10/11/2012 1441   ALT 24 08/22/2012 1700   BILITOT 0.38 10/11/2012 1441   BILITOT 0.2* 08/22/2012 1700       Lab Results  Component Value Date   LABCA2 16 08/24/2011     STUDIES: No results found.   ASSESSMENT: 37 y.o.  [place] woman, known to be BRCA1 and 2 negative,   (1) status post bilateral mastectomies May of 2012 for a right-sided T1c N1 (stage II) grade 3 invasive ductal carcinoma,estrogen and progesterone receptor positive, HER-2 amplified, with an MIB-1-1 of 85%.   (2) status post carboplatin/docetaxol/trastuzumab x6, completed mid December 2012   (3) on trastuzumab every 3 weeks, started August of 2012, interrupted between April and August of 2013, completed 04/27/2012. Most recent echo 03/19/2012  (4) status post radiation therapy, completed 07/12/2011  (5) On tamoxifen since April 2013   (6) On goserelin given every 3 months, 1st injection on 10/26/11, most recent dose 10/11/2012.   (7) port flush every 6 weeks  PLAN:  We will evaluate Amando today with a Doppler of the left upper extremity to evaluate for possible DVT in the setting of a patient on tamoxifen, with a left-sided port. If that is negative, she will have her port flushed today. Of course if it is positive, we will discuss anti-coagulation.  I think the axillary pain the Brytni has been experiencing, especially in the right axilla, is going to be associated with neuropathic pain status post bilateral mastectomies. She was previously on gabapentin, and we are going to  try restarting that to see if it helps with the pain. If not, certainly our next step would be axillary ultrasound, although there are no palpable abnormalities at this time.   With regards to the bone pain, this tends to affect the forearms and the shins bilaterally. Although I do not think it would be a bad idea to obtain a bone scan, I also think it is very unlikely that she would have bony metastasis localized to these particular areas. I did suggest obtaining plain film x-rays for further evaluation, and Nivedita will consider these options.  Otherwise, Ithzel is scheduled for port flushes every 6 weeks. She'll receive goserelin every 3 months, due again on September 8, and she'll see Dr. [name] for routine followup in November. She voices understanding and agreement with this plan.   Leonia Heatherly    12/27/2012

## 2012-12-27 NOTE — Progress Notes (Unsigned)
Pt is a new start on coumadin for a newly diagnosed ULE DVT. Called and asked to educate patient and provide samples of lovenox and coumadin. Spoke with patient and her husband in clinic Provided education on diet and our clinic Lovenox 100mg  x 5 days given.  She was educated in flush room by RN and actually gave her self the injection Provided 5mg  samples #20 [id number]  Exp: 2/16 RTC on Tue 01/01/13 Patient will stop her tamoxifen and speak with Zollie Scale at her next appmt with her in Sept about switching therapy.

## 2012-12-27 NOTE — Progress Notes (Addendum)
[name] a Doppler was positive so we are starting Lovenox today. She will receive 3 doses. She will start her Coumadin today and I put in a referral to our Coumadin clinic.   She tells [place] in June she had some pain in the left armpit and perhaps that is when the clot actually occurred, since according to the Doppler study this looks more chronically and acute.  Crystal [name] understands the dangers of bleeding particularly she is to avoid any trauma to the head. She knows to call for any bleeding issues that may develop.   She is a ready scheduled to return for injection in September 8, and we will try to see her that day to make sure the anticoagulation is going well. We will do at least 3 months, possibly 6.

## 2012-12-27 NOTE — Addendum Note (Signed)
Addended by: Lowella Dell on: 12/27/2012 02:07 PM   Modules accepted: Orders

## 2012-12-27 NOTE — Progress Notes (Signed)
*  Preliminary Results* Left upper extremity venous duplex completed. Left upper extremity is positive for subacute vs indeterminate age deep vein thrombosis involving a single left brachial vein of the proximal upper arm.  Preliminary results discussed with Dr.[name].  12/27/2012 1:48 PM  Gertie Fey, RVT, RDCS, RDMS

## 2012-12-27 NOTE — Progress Notes (Signed)
Patient to go home with Lovenox 100 mg injections x 5 days. Patient had her port flushed today, and I also demonstrated how to administer a Lovenox injection. Patient gave herself the injection without any difficulty. Per Zollie Scale, PA, have patient stop taking Tamoxifen until she is seen again in the office. Patient has follow up appointments already scheduled. Patient verbalized understanding. I instructed patient if she had any questions to call  [phone].

## 2012-12-27 NOTE — Progress Notes (Signed)
Doppler ultrasound of the left upper extremity does show a clot. This was not present last every. We're going to start Lovenox today and continue for 3 days, and referred to the Coumadin clinic.

## 2012-12-27 NOTE — Telephone Encounter (Signed)
appts made and printed...td 

## 2012-12-27 NOTE — Progress Notes (Signed)
Pt seen in clinic today with husband New consult to our clinic to be seen today after diagnosed with LUE dvt Started pt on 5mg  coumadin and 100 mg lovenox. She gave herself the shot in flush room and was given 5 more shots to cover until next appmt Coumadin 5mg  #20 [id number]  Exp: 2/16 Education provided regarding diet, administration, alcohol and herbal supplement intake  She will RTC on 01/01/13  MD did order INR (1.0) and Heparin level  Goal INR=2-3  She will stop her tamoxifen and discuss her alternative therapy at next appmt with Amy Allyson Sabal

## 2012-12-27 NOTE — Patient Instructions (Addendum)
Enoxaparin injection What is this medicine? ENOXAPARIN (ee nox a PA rin) is used after knee, hip, or abdominal surgeries to prevent blood clotting. It is also used to treat existing blood clots in the lungs or in the veins. This medicine may be used for other purposes; ask your health care provider or pharmacist if you have questions. What should I tell my health care provider before I take this medicine? They need to know if you have any of these conditions: -bleeding disorders, hemorrhage, or hemophilia -infection of the heart or heart valves -kidney or liver disease -previous stroke -prosthetic heart valve -recent surgery or delivery of a baby -ulcer in the stomach or intestine, diverticulitis, or other bowel disease -an unusual or allergic reaction to enoxaparin, heparin, pork or pork products, other medicines, foods, dyes, or preservatives -pregnant or trying to get pregnant -breast-feeding How should I use this medicine? This medicine is for injection under the skin. It is usually given by a health-care professional. You or a family member may be trained on how to give the injections. If you are to give yourself injections, make sure you understand how to use the syringe, measure the dose if necessary, and give the injection. To avoid bruising, do not rub the site where this medicine has been injected. Do not take your medicine more often than directed. Do not stop taking except on the advice of your doctor or health care professional. Make sure you receive a puncture-resistant container to dispose of the needles and syringes once you have finished with them. Do not reuse these items. Return the container to your doctor or health care professional for proper disposal. Talk to your pediatrician regarding the use of this medicine in children. Special care may be needed. Overdosage: If you think you have taken too much of this medicine contact a poison control center or emergency room at  once. NOTE: This medicine is only for you. Do not share this medicine with others. What if I miss a dose? If you miss a dose, take it as soon as you can. If it is almost time for your next dose, take only that dose. Do not take double or extra doses. What may interact with this medicine? Do not take this medicine with any of the following medications: -aspirin and aspirin-like medicines -heparin -mifepristone -warfarin This medicine may also interact with the following medications: -cilostazol -clopidogrel -dipyridamole -NSAIDs, medicines for pain and inflammation, like ibuprofen or naproxen -sulfinpyrazone -ticlopidine This list may not describe all possible interactions. Give your health care provider a list of all the medicines, herbs, non-prescription drugs, or dietary supplements you use. Also tell them if you smoke, drink alcohol, or use illegal drugs. Some items may interact with your medicine. What should I watch for while using this medicine? Visit your doctor or health care professional for regular checks on your progress. Your condition will be monitored carefully while you are receiving this medicine. Contact your doctor or health care professional and seek emergency treatment if you develop increased difficulty in breathing, chest pain, dizziness, shortness of breath, swelling in the legs or arms, abdominal pain, decreased vision, pain when walking, or pain and warmth of the arms or legs. These can be signs that your condition has gotten worse. Monitor your skin closely for easy bruising or red spots, which can be signs of bleeding. If you notice easy bruising or minor bleeding from the nose, gums/teeth, in your urine, or stool, contact your doctor or health care professional  right away. The dose of your medicine may need to be changed. If you are going to have surgery, tell your doctor or health care professional that you are taking this medicine. Try to avoid injury while you are  using this medicine. Be careful when brushing or flossing your teeth, shaving, cutting your fingernails or toenails, or when using sharp objects. Report any injuries to your doctor or health care professional. What side effects may I notice from receiving this medicine? Side effects that you should report to your doctor or health care professional as soon as possible: -allergic reactions like skin rash, itching or hives, swelling of the face, lips, or tongue -black, tarry stools -breathing problems -dark urine -feeling faint or lightheaded, falls -fever -heavy menstrual bleeding -unusual bruising or bleeding Side effects that usually do not require medical attention (report to your doctor or health care professional if they continue or are bothersome): -pain or irritation at the injection site This list may not describe all possible side effects. Call your doctor for medical advice about side effects. You may report side effects to FDA at 1-800-FDA-1088. Where should I keep my medicine? Keep out of the reach of children. Store at room temperature between 15 and 30 degrees C (59 and 86 degrees F). Do not freeze. If your injections have been specially prepared, you may need to store them in the refrigerator. Ask your pharmacist. Throw away any unused medicine after the expiration date. NOTE: This sheet is a summary. It may not cover all possible information. If you have questions about this medicine, talk to your doctor, pharmacist, or health care provider.  2013, [employer]. (06/29/2007 4:47:37 PM) Tunneled Central Venous Catheter Flushing Guide A tunneled central venous catheter should be flushed after medicines, intravenous fluids, or nutrition have been given. Flushing the catheter helps prevent blood and medicine residue from collecting in the catheter and clogging it. The following are instructions to flush your tunneled central venous catheter. Your caregiver may also give you  additional flushing instructions.  GENERAL TUNNELED CENTRAL VENOUS CATHETER FLUSHING INFORMATION  If possible, another person should help you when flushing the catheter. Always wash your hands before touching or flushing the catheter.  Each access (lumen, port) of the catheter has a clamp. Open the clamp to flush the catheter or when infusions are being given. Keep the clamp closed when the catheter is not in use.  Do not use force to flush the catheter.  Do not use a small diameter syringe such as a 3 mL syringe to flush the catheter. Flushing the catheter with a small diameter syringe can burst the catheter. A 10 mL syringe should be used when flushing the catheter.  Do not use a sharp-edged clamp (hemostat) to clamp the catheter.  If the catheter has heparin instilled in the line, the heparin should be removed before using the catheter. The heparin helps the catheter from becoming clogged when it is not used on a regular basis. WITHDRAWING HEPARIN FROM THE TUNNELED CENTRAL VENOUS CATHETER  When the tunneled central venous catheter is not used for long periods of time, heparin may be injected into the catheter lumen(s) and left to "dwell" until it is used again.  If heparin has been instilled in the catheter, the heparin must be removed before it can be used. The following are steps to remove heparin from the tunneled central venous catheter:  Gather the appropriate supplies as told by your caregiver.  Wash your hands thoroughly with soap and  water.  Put on clean gloves.  Vigorously scrub the injection cap for 15 seconds with an alcohol prep pad.  Unclamp the catheter.  Attach an empty 10 mL syringe to the injection cap. Pull back on the syringe plunger and withdraw 10 milliliters of blood. This is considered a "waste" withdrawal. Dispose the "waste" syringe into an appropriate waste container.  Rescrub the injection cap for 15 seconds with an alcohol prep pad.  Attach a prefilled  10 mL normal saline syringe to the injection cap. Flush the catheter with normal saline to clear away the blood in the catheter. The catheter can now be used for medicines, intravenous fluids, and nutrition.  Follow your caregiver's advice regarding when the tunneled central venous catheter should be flushed with heparin. FLUSHING THE TUNNELED CENTRAL VENOUS CATHETER WITH NORMAL SALINE   Normal saline is used to flush the catheter before and after medicines, intravenous fluids, and nutrition are given. It is important to check for a blood return before using the catheter. A blood return is checked to ensure the catheter is open (patent) and is not clogged. The following are instructions to check for a blood return and flush the catheter with normal saline.  Gather the appropriate supplies to flush the catheter as instructed by your caregiver.  Wash your hands thoroughly with soap and water.  Put on clean gloves.  Vigorously scrub the injection cap for 15 seconds with an alcohol prep pad.  Unclamp the catheter.  Attach a 10 mL prefilled normal saline syringe to the injection cap. Flush 5 milliliters of normal saline into the catheter. Pull back on the attached syringe until you see blood in the catheter. When blood is identified in the catheter, flush the catheter with the remaining normal saline.  The catheter can now be used for medicines, intravenous fluids, and nutrition.  After medicines, intravenous fluids, or nutrition have been given, be sure to flush the catheter with 10 milliliters of normal saline. TROUBLESHOOTING THE TUNNELED CENTRAL VENOUS CATHETER  If you cannot flush the catheter, check to make sure the catheter is unclamped.  The injection cap may need to be changed if blood cannot be completely cleared from the cap or if there is a crack in the injection cap.  If the catheter is difficult to flush, flush the catheter with 10 milliliters of normal saline using a "pulsing"  flush method.  If you do not see blood when you pull back the syringe plunger, change your body position. This can include raising your arms above your head or taking a deep breath and cough. Pull back on the plunger again. If there is still no blood return, flush with a small amount of saline and change positions, take a deep breath, or cough again. Pull back on the plunger again. If there is still no blood return, finish injecting the normal saline. Disconnect the syringe and clamp the catheter.  If you cannot get a blood return or the catheter continues to be difficult to flush, it is very important to contact your caregiver. The catheter may be clogged. Your caregiver can troubleshoot and fix problems with the catheter. SEEK IMMEDIATE MEDICAL CARE IF:  You notice redness, swelling, pain, or have any type of drainage at or around the catheter insertion site.  Your catheter is difficult to flush, or you cannot flush the catheter.  You cannot get a blood return from the catheter.  Your catheter leaks when flushed or when fluids are infused into it.  There are cracks or breaks in the catheter.  You have swelling on the arm nearest to the catheter.  You have an unexplained fever. Document Released: 04/07/2011 Document Revised: 07/11/2011 Document Reviewed: 04/07/2011 [facility] Patient Information 2014 [place], [place].

## 2012-12-27 NOTE — Progress Notes (Signed)
Patient came by and will bring me back letter of support from her parents. She doesn't work and her hubby is self employed--so no check stubs and no tax from last year---wants asst -- [employer] for meds.

## 2013-01-01 ENCOUNTER — Other Ambulatory Visit (HOSPITAL_BASED_OUTPATIENT_CLINIC_OR_DEPARTMENT_OTHER): Payer: Self-pay | Admitting: Lab

## 2013-01-01 ENCOUNTER — Telehealth: Payer: Self-pay | Admitting: *Deleted

## 2013-01-01 ENCOUNTER — Other Ambulatory Visit: Payer: Self-pay | Admitting: Physician Assistant

## 2013-01-01 ENCOUNTER — Ambulatory Visit (HOSPITAL_BASED_OUTPATIENT_CLINIC_OR_DEPARTMENT_OTHER): Payer: Self-pay | Admitting: Lab

## 2013-01-01 ENCOUNTER — Ambulatory Visit: Payer: Self-pay

## 2013-01-01 ENCOUNTER — Ambulatory Visit (HOSPITAL_BASED_OUTPATIENT_CLINIC_OR_DEPARTMENT_OTHER): Payer: Self-pay | Admitting: Pharmacist

## 2013-01-01 ENCOUNTER — Other Ambulatory Visit: Payer: Self-pay | Admitting: Lab

## 2013-01-01 DIAGNOSIS — C50911 Malignant neoplasm of unspecified site of right female breast: Secondary | ICD-10-CM

## 2013-01-01 DIAGNOSIS — R600 Localized edema: Secondary | ICD-10-CM

## 2013-01-01 DIAGNOSIS — Z5181 Encounter for therapeutic drug level monitoring: Secondary | ICD-10-CM

## 2013-01-01 DIAGNOSIS — R5383 Other fatigue: Secondary | ICD-10-CM

## 2013-01-01 DIAGNOSIS — Z7901 Long term (current) use of anticoagulants: Secondary | ICD-10-CM

## 2013-01-01 DIAGNOSIS — M792 Neuralgia and neuritis, unspecified: Secondary | ICD-10-CM

## 2013-01-01 DIAGNOSIS — I82409 Acute embolism and thrombosis of unspecified deep veins of unspecified lower extremity: Secondary | ICD-10-CM

## 2013-01-01 DIAGNOSIS — C50919 Malignant neoplasm of unspecified site of unspecified female breast: Secondary | ICD-10-CM

## 2013-01-01 LAB — CBC WITH DIFFERENTIAL/PLATELET
Basophils Absolute: 0.1 10*3/uL (ref 0.0–0.1)
Eosinophils Absolute: 0.3 10*3/uL (ref 0.0–0.5)
HGB: 15.3 g/dL (ref 11.6–15.9)
LYMPH%: 25.2 % (ref 14.0–49.7)
MCV: 96.4 fL (ref 79.5–101.0)
MONO#: 0.3 10*3/uL (ref 0.1–0.9)
MONO%: 4.4 % (ref 0.0–14.0)
NEUT#: 4.5 10*3/uL (ref 1.5–6.5)
Platelets: 193 10*3/uL (ref 145–400)

## 2013-01-01 LAB — COMPREHENSIVE METABOLIC PANEL (CC13)
Albumin: 3.9 g/dL (ref 3.5–5.0)
Alkaline Phosphatase: 71 U/L (ref 40–150)
BUN: 9.6 mg/dL (ref 7.0–26.0)
CO2: 24 mEq/L (ref 22–29)
Glucose: 96 mg/dl (ref 70–140)
Potassium: 4.1 mEq/L (ref 3.5–5.1)
Total Bilirubin: 0.3 mg/dL (ref 0.20–1.20)

## 2013-01-01 NOTE — Telephone Encounter (Signed)
Pt called to this RN to request an earlier appointment with the coumadin clinic due to " I don't feel right and feel like I need to be seen this morning ".  Crystal [name] states " just don't feel right ". She states increased fatique, forgetfulness and some dizziness "  She also believes she took 2 pills ( 2 doses ) in one day - " because I forgot if I took one so I took another one ".  Pt's appointment rescheduled from this afternoon to this am.

## 2013-01-01 NOTE — Progress Notes (Signed)
INR = 1.6 Pt is on Lovenox 100 mg SQ Q24 hrs (1.5 mg/kg/day) & has taken Coumadin 5 mg/day except she took 10 mg one day over the weekend (?unsure which day) for LUE DVT. She has scattered bruising, mostly in her abdomen from the Lovenox injections.  The bruising is minor. No bleeding. Pt c/o L > R axillary swelling & her port-a-cath is bothering her (the port is on the L side of her chest). She called Mena Pauls, RN this AM to request an earlier Coumadin clinic appt because she feels bad & she was worried about the extra Coumadin dose she took. She states she is more emotional-- yesterday she cried a lot thinking that she would never be able to have a baby. She states she has fatigue along w/ chronic bone pain. When reviewing her meds w/ her today, she reports she only takes Xanax once daily but sometimes uses it up to twice daily if she is emotional.  She has not been taking Celexa in 2 weeks (she stopped this on her own; just doesn't want to take all these meds).  She is taking Neurontin 1200 mg BID (rather than TID).  She is not using Percocet. Pt is planning to check a urine pregnancy test today just to make sure her sxs are not related to an unplanned pregnancy (unlikely since she is on Zoladex; off Femara). She is now willing to have a bone scan to r/o metastatic disease & she requested a CBC today. I have discussed w/ Zollie Scale, PA over the phone today &  Amy will work pt in to see tomorrow at 3:15 pm to discuss her sxs. She was instructed to restart her Celexa today & she agreed to do so. For the Coumadin, we will have pt continue Lovenox 100 mg (I gave her 3 more syringes; she had 1 remaining w/ her to use for today as planned). I will increase her Coumadin dose to 7.5 mg/day starting today. Recheck her INR this Friday (01/04/13) in hopes we can stop the Lovenox if INR at goal. Pt understands that if she develops altered mental status, severe HA or seizure activity, she should report to  ER. Ebony Hail, Pharm.D., CPP 01/01/2013@1 :33 PM

## 2013-01-02 ENCOUNTER — Ambulatory Visit (HOSPITAL_BASED_OUTPATIENT_CLINIC_OR_DEPARTMENT_OTHER): Payer: Self-pay | Admitting: Physician Assistant

## 2013-01-02 ENCOUNTER — Encounter: Payer: Self-pay | Admitting: Physician Assistant

## 2013-01-02 VITALS — BP 118/81 | HR 92 | Temp 98.3°F | Resp 18 | Ht 65.0 in | Wt 140.1 lb

## 2013-01-02 DIAGNOSIS — IMO0002 Reserved for concepts with insufficient information to code with codable children: Secondary | ICD-10-CM

## 2013-01-02 DIAGNOSIS — C50919 Malignant neoplasm of unspecified site of unspecified female breast: Secondary | ICD-10-CM

## 2013-01-02 DIAGNOSIS — C50911 Malignant neoplasm of unspecified site of right female breast: Secondary | ICD-10-CM

## 2013-01-02 DIAGNOSIS — I82629 Acute embolism and thrombosis of deep veins of unspecified upper extremity: Secondary | ICD-10-CM | POA: Insufficient documentation

## 2013-01-02 DIAGNOSIS — M898X9 Other specified disorders of bone, unspecified site: Secondary | ICD-10-CM

## 2013-01-02 DIAGNOSIS — I82409 Acute embolism and thrombosis of unspecified deep veins of unspecified lower extremity: Secondary | ICD-10-CM

## 2013-01-02 DIAGNOSIS — F411 Generalized anxiety disorder: Secondary | ICD-10-CM

## 2013-01-02 DIAGNOSIS — F329 Major depressive disorder, single episode, unspecified: Secondary | ICD-10-CM

## 2013-01-02 DIAGNOSIS — R600 Localized edema: Secondary | ICD-10-CM

## 2013-01-02 DIAGNOSIS — M792 Neuralgia and neuritis, unspecified: Secondary | ICD-10-CM

## 2013-01-02 DIAGNOSIS — M899 Disorder of bone, unspecified: Secondary | ICD-10-CM

## 2013-01-02 DIAGNOSIS — I82622 Acute embolism and thrombosis of deep veins of left upper extremity: Secondary | ICD-10-CM

## 2013-01-02 NOTE — Progress Notes (Signed)
ID: Crystal [name]   DOB: 22-Mar-1976  MR#: [id number]  JYN#:[id number]  QMV:[id number], MD GYN: SURoyston Sinner, MD OTHER:  [name], MD    HISTORY OF PRESENT ILLNESS: The patient is a 37 year old [place] woman who noted a mass in her right breast and brought it to her primary care physician's attention June of 2012. She was set up for mammography at [facility], where an area of calcifications highly suspicious for carcinoma was biopsied. This was in the upper outer quadrant and it measured 1.7 cm mammographically and 1.8 cm by ultrasonography. The pathology report ([id number]) showed a high-grade invasive ductal carcinoma estrogen receptor positive at 64% progesterone receptor positive at 18%, and HER-2 amplified with a ratio by CISH of 5.24. MIB-1-1 was 65%. A second mass biopsied at the same time was also triple positive.  Given her multiple masses in the right breast, mastectomy was recommended. The patient actually opted for bilateral mastectomies and this was performed together with a right axillary lymph node dissection under Dr. Felicity Pellegrini on 11/16/2010. The final pathology [id number]) showed, on the left, no evidence of cancer. On the right the patient had a high-grade invasive ductal carcinoma measuring 1.9 cm, with negative margins, grade 3, involving 2 of 16 lymph nodes (stage IIB).  Her subsequent history is as detailed below.  INTERVAL HISTORY:  Crystal [name] returns today accompanied by her husband Crystal [name] for followup of her recently diagnosed left upper extremity DVT. She's been followed by the Coumadin clinic, and continues on Lovenox daily in addition to oral Coumadin. INR was still slightly subtherapeutic when checked yesterday, 01/01/2013, at 1.60.  She discontinued tamoxifen as instructed on 12/27/2012.  Crystal [name] is still having some pain and discomfort in the left arm and left axillary region, with stable swelling. She is more concerned, however, about her continued bony pain which  is diffuse, but she feels like the pain in her hips and lower back has increased somewhat. She is extremely worried about disease recurrence as is Crystal [name].  REVIEW OF SYSTEMS: Crystal [name] has had no fevers or chills. She continues to have anxiety and depression which are chronic and stable. She's had no signs of abnormal bleeding, although she is bruising very easily. Her appetite is fair. She tells me she has stopped drinking beer since she started on the blood thinners. She denies any nausea or change in bowel habits. She denies any increased cough, shortness of breath, or chest pain. She has a history of headaches but these are stable and have not worsened, and she denies any dizziness or change in vision.  A detailed review of systems is otherwise stable and noncontributory.   PAST MEDICAL HISTORY: Past Medical History  Diagnosis Date  . Status post chemotherapy     docetaxel/carboplatin/trastuzumab  . Anxiety   . Hx MRSA infection 03/28/11    Skin  . History of breast cancer DX JUNE 2012 W/ RIGHT BREAST INVASIVE DUCTAL CA  STAGE IIB---  S/P BILATERAL MASTECTOMIES AND RIGHT NODE DISSECTION    ONCOLOGIST-- DR [name]--  CHEMO ENDED DEC 2012 / RADIATION ENDED MAR 2013--  CURRENT ON TAMOXIFEN AND ZOLADEX  . Family history of anesthesia complication     MOTHER PONV  . Hot flashes due to tamoxifen   . History of idiopathic seizure     DX 2008 --  LAST ONE 2012--  NO ISSUES SINCE AND NO MEDS. -- PT STATES DOCTORS FELT IT WAS STRESS RELATED DUE TO HEALTH ISSUES  . Bipolar affective disorder,  mixed   . GERD (gastroesophageal reflux disease)     OCCASIONALLY TAKE ZANTAC  . Left ovarian cyst   . Right lower quadrant abdominal pain   . Frequency of urination   . Urgency of urination   . Strains to urinate   . Nocturia   . Heart palpitations   . Cancer   . Depression   . Seizures   1. History of seizures. The patient has been thoroughly evaluated both here by Dr. [name] and at [facility] for  this. Among many studies, she had an MRI of the brain on August 28th. This showed an incidental solitary small lesion in the left frontoparietal white matter. It was not felt to be suggestive of multiple sclerosis. A noncontrasted CT in August 2010 was negative. The patient has been off all seizure medications now for 2 months. There has been no evidence of seizure recurrence. 2. Complex psychology history (please refer to Dr. [name] note in the E-chart from August 29, 2009) with evidence of anxiety disorder, depression and psychosis. The patient is currently on no medications for this and denies any of these symptoms at present. 3. History of migraines. 4. Remote history of multidrug abuse. 5. Status post wisdom teeth extraction.  History of MRSA skin infection   PAST SURGICAL HISTORY: Past Surgical History  Procedure Laterality Date  . Wisdom tooth extraction    . Biopsy breast  10/14/2010    right needle core biopsy  . Portacath placement  11/16/2010    placement of left subclavian port  . I&d extremity  09/26/2011    Procedure: IRRIGATION AND DEBRIDEMENT EXTREMITY;  Surgeon: [name] Ribas, MD;  Location: [facility];  Service: Orthopedics;  Laterality: Right;  I&D right hand cat bite wound  . Breast surgery  11/16/2010    bilateral mastectomy+ right axillary node dissection,T1cN1a, Her2+,ERPR+  (right breast invasive ductal carcinoma)  . Transthoracic echocardiogram  05-28-2012    LVF NORMAL/  EF 55-60%  . Cysto with hydrodistension N/A 07/23/2012    Procedure: CYSTOSCOPY/HYDRODISTENSION instillation of marcaine and pyridium;  Surgeon: Martina Sinner, MD;  Location: [facility];  Service: Urology;  Laterality: N/A;  INSTILLATION      FAMILY HISTORY Family History  Problem Relation Age of Onset  . Cancer Maternal Grandfather   . Mental illness Brother   . Mental illness Son   The patient's mother is living, in her mid 101s, with no history of breast or ovarian cancer. The  patient does not know her biological father. The patient has an older brother with bipolar disease, and according to the patient, borderline schizophrenia.   GYNECOLOGIC HISTORY: Menarche at age 28. She is G1, P1 with first pregnancy to term at age 8. The patient had significant pelvic pain recently and was evaluated with transvaginal and pelvic ultrasound on July 17th by Dr. Natale Milch. This showed normal uterine myometrium and endometrium with bilateral functional ovarian cysts. There were no worrisome features. She is currently on every three-month goserelin.  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 Her husband of 4 years, Crystal [name], works as a Administrator. In addition to them at home is the patient's son, Crystal [name], 87 years old attends [employer] in the 11TH grade. Crystal [name], according to the patient, has significant nausea problems. The patient attends a local [address]. 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    ADVANCED DIRECTIVES: Not in place  HEALTH  MAINTENANCE: History  Substance Use Topics  . Smoking status: Current Every Day Smoker -- 1.00 packs/day for 20 years    Types: Cigarettes  . Smokeless tobacco: Never Used  . Alcohol Use: Yes     Colonoscopy:  PAP:  Bone density:  Lipid panel:  Allergies  Allergen Reactions  . Tramadol Other (See Comments)    Seizures  . Phenytoin Nausea And Vomiting  . Thorazine [Chlorpromazine Hcl] Hives  . Ciprofloxacin Nausea And Vomiting  . Vancomycin Rash    Possible rash reported by MD    Current Outpatient Prescriptions  Medication Sig Dispense Refill  . ALPRAZolam (XANAX) 1 MG tablet Take 1 three times a day for anxiety.  90 tablet  4  . carvedilol (COREG) 6.25 MG tablet Take 6.25 mg by mouth daily as needed. For heart palpitations (rapid heart beat)      . gabapentin (NEURONTIN) 600 MG tablet Take 2 tablets (1,200 mg total) by mouth 3 (three) times daily.  180 tablet  1   . goserelin (ZOLADEX) 10.8 MG injection Inject 10.8 mg into the skin every 3 (three) months.      Marland Kitchen oxycodone-acetaminophen (PERCOCET) 2.5-325 MG per tablet Take 1 tablet by mouth every 8 (eight) hours as needed for pain.  15 tablet  0  . oxyCODONE-acetaminophen (ROXICET) 5-325 MG per tablet Take 1 tablet by mouth every 8 (eight) hours as needed for pain.  15 tablet  0  . ranitidine (ZANTAC) 150 MG tablet Take 150 mg by mouth 2 (two) times daily as needed for heartburn.      . warfarin (COUMADIN) 5 MG tablet Take 1 tablet (5 mg total) by mouth daily.  30 tablet  3   No current facility-administered medications for this visit.   Facility-Administered Medications Ordered in Other Visits  Medication Dose Route Frequency Provider Last Rate Last Dose  . bupivacaine (MARCAINE) 0.5 % 15 mL, phenazopyridine (PYRIDIUM) 400 mg bladder mixture   Bladder Instillation Once Martina Sinner, MD       OBJECTIVE: Young white female who appears anxious Filed Vitals:   01/02/13 1514  BP: 118/81  Pulse: 92  Temp: 98.3 F (36.8 C)  Resp: 18  ECOG: 1 Filed Weights   01/02/13 1514  Weight: 140 lb 1.6 oz (63.549 kg)   Physical Exam: Port is intact in the left chest wall with no erythema. There is increased edema in the upper left chest wall and left neck, and also in the left axilla, stable compared to last week. Neuro:  Nonfocal, well oriented, anxious affect  Remainder of physical exam was deferred today.   LAB RESULTS: Lab Results  Component Value Date   WBC 6.8 01/01/2013   NEUTROABS 4.5 01/01/2013   HGB 15.3 01/01/2013   HCT 43.7 01/01/2013   MCV 96.4 01/01/2013   PLT 193 01/01/2013      Chemistry      Component Value Date/Time   NA 145 01/01/2013 1211   NA 138 08/22/2012 1700   K 4.1 01/01/2013 1211   K 3.7 08/22/2012 1700   CL 105 10/11/2012 1441   CL 103 08/22/2012 1700   CO2 24 01/01/2013 1211   CO2 23 08/22/2012 1700   BUN 9.6 01/01/2013 1211   BUN 11 08/22/2012 1700   CREATININE 0.8 01/01/2013  1211   CREATININE 0.96 08/22/2012 1700      Component Value Date/Time   CALCIUM 9.9 01/01/2013 1211   CALCIUM 9.1 08/22/2012 1700   ALKPHOS 71 01/01/2013  1211   ALKPHOS 87 08/22/2012 1700   AST 23 01/01/2013 1211   AST 33 08/22/2012 1700   ALT 21 01/01/2013 1211   ALT 24 08/22/2012 1700   BILITOT 0.30 01/01/2013 1211   BILITOT 0.2* 08/22/2012 1700         STUDIES:  12/27/2012  Noninvasive Vascular Lab  Left Upper Extremity Venous Duplex Evaluation  Patient: Crystal [name], Crystal [name] MR #: [id number] Study Date: 12/27/2012 Gender: F Age: 84 Height: Weight: BSA: Pt. Status: Room:  ATTENDING Crystal [name], Crystal [name] REFERRING Crystal [name], Crystal [name] SONOGRAPHER Crystal [name], Crystal [name], Crystal [name], Crystal [name] ORDERING Crystal [name], Crystal [name] REFERRING Crystal [name], Crystal [name] Reports also to:  ------------------------------------------------------------ History and indications:  Indications  729.81 Swelling of limb. 729.5 Pain in limb. History  Diagnostic evaluation.  ------------------------------------------------------------ Study information:  Study status: Routine. Procedure: A vascular evaluation was performed with the patient in the supine position. Image quality was adequate. The left internal jugular, left subclavian, left axillary, left basilic, left cephalic, left brachial, left radial, and left ulnarveins were studied. Left upper extremity venous duplex study. Doppler flow study including B-mode compression maneuvers of all visualized segments, color flow Doppler and selected views of pulsed wave Doppler. Location: Vascular laboratory. Patient status: Outpatient.  Venous flow:  +------------+------------+----------------+---------------+ Location Overall Flow properties Comments  +------------+------------+----------------+---------------+ Left Patent Phasic; --------------- internal  spontaneous;   jugular  compressible    +------------+------------+----------------+---------------+ Left Patent Phasic; --------------- subclavian  spontaneous;     compressible   +------------+------------+----------------+---------------+ Left Patent Phasic; --------------- axillary  spontaneous;     compressible   +------------+------------+----------------+---------------+ Left Partially Noncompressible Subacute vs.  brachial thrombosed  indeterminate     age  +------------+------------+----------------+---------------+ Left Patent Compressible --------------- cephalic     +------------+------------+----------------+---------------+ Left basilicPatent Compressible --------------- +------------+------------+----------------+---------------+ Left radial Patent Compressible --------------- +------------+------------+----------------+---------------+ Left ulnar Patent Compressible --------------- +------------+------------+----------------+---------------+  ------------------------------------------------------------ Summary: Findings consistent with subacute vs. indeterminate age deep vein thrombosis involvinga singleleft brachial vein of the proximal upper arm.  Other specific details can be found in the table(s) above. Prepared and Electronically Authenticated by  Crystal [name] 2014-08-28T14:55:[id number]     ASSESSMENT: 37 y.o.  Crystal [name] woman, known to be BRCA1 and 2 negative,   (1) status post bilateral mastectomies May of 2012 for a right-sided T1c N1 (stage II) grade 3 invasive ductal carcinoma,estrogen and progesterone receptor positive, HER-2 amplified, with an MIB-1-1 of 85%.   (2) status post carboplatin/docetaxol/trastuzumab x6, completed mid December 2012   (3) on trastuzumab every 3 weeks, started August of 2012, interrupted between April and August of 2013, completed 04/27/2012. Most recent echo 03/19/2012  (4) status post  radiation therapy, completed 07/12/2011  (5) On tamoxifen since April 2013, held as of 12/27/2012 with the diagnosis of a left upper extremity DVT.  On therapeutic Coumadin since that time.   (6) On goserelin given every 3 months, 1st injection on 10/26/11, most recent dose 10/11/2012.    PLAN:  Our entire 30 minute appointment today was spent counseling the patient with regards to her recent diagnosis of DVT, discussing her treatment plan, and coordinating care. Of course she will continue on Coumadin and Lovenox until her INR is therapeutic between 2 and 3. She will continue be followed closely through our Coumadin clinic, and her long-term goal be to anticoagulate for 6 months. She would like to have her port out as soon as she can, but is concerned not only with timing, but also with the financial aspect of that procedure. She'll discuss this with Dr. [name] when she sees him in  November.  Crystal [name] continues to complain of diffuse bony pain, and feels like the pain in her hips and back has increased. She is now 2 years out from her definitive surgery in July of 2012. She had a chest CT in February of this year, and a CT of the abdomen and pelvis in March of this year. We will proceed with a bone scan to complete restaging.  I will see Crystal [name] again next week for brief followup when she returns on September 8 for her Zoladex injection. She will then return to see Dr. [name] in approximately 8 weeks to discuss her future treatment plan, which will likely include an aromatase inhibitor.  Pachia voices understanding and agreement with this plan.   Mattie Novosel    01/02/2013

## 2013-01-04 ENCOUNTER — Ambulatory Visit (HOSPITAL_BASED_OUTPATIENT_CLINIC_OR_DEPARTMENT_OTHER): Payer: Self-pay | Admitting: Pharmacist

## 2013-01-04 ENCOUNTER — Other Ambulatory Visit (HOSPITAL_BASED_OUTPATIENT_CLINIC_OR_DEPARTMENT_OTHER): Payer: Self-pay

## 2013-01-04 DIAGNOSIS — C50911 Malignant neoplasm of unspecified site of right female breast: Secondary | ICD-10-CM

## 2013-01-04 DIAGNOSIS — M792 Neuralgia and neuritis, unspecified: Secondary | ICD-10-CM

## 2013-01-04 DIAGNOSIS — R600 Localized edema: Secondary | ICD-10-CM

## 2013-01-04 DIAGNOSIS — I82409 Acute embolism and thrombosis of unspecified deep veins of unspecified lower extremity: Secondary | ICD-10-CM

## 2013-01-04 LAB — PROTIME-INR: INR: 1.3 — ABNORMAL LOW (ref 2.00–3.50)

## 2013-01-04 NOTE — Progress Notes (Signed)
Saw pt in clinic today with husband, [name]. INR dropped to 1.3 on lovenox 100mg  and coumadin increased to 7.5 mg daily She states she had missed one dose and did drink beer one night. Husband and patient said she has been extremely tired lately. They questioned if it could be the coumadin.  They also state her HR has seemed high. BP today was 120/86 and HR 100. Pt states she has coreg to take PRN per Dr. Milas Kocher. States she may restart taking it to decrease HR We will see in clinic again next Wed when she has a f/u with Amy Berry Continue lovenox shots at 100 mg daily and coumadin 7.5 mg daily

## 2013-01-04 NOTE — Patient Instructions (Addendum)
Continue Lovenox 100 mg subcutaneous injections every 24 hours and Coumadin 7.5 mg daily (1 1/2 tablets). Recheck INR on Wednesday 01/09/13 at 10:45 lab; 11:00 pm for coumadin

## 2013-01-06 ENCOUNTER — Telehealth: Payer: Self-pay | Admitting: Oncology

## 2013-01-06 NOTE — Telephone Encounter (Signed)
lmonvm for pt that 9/8 appt was cx'd due to bone scan not until 9/9. Confirmed next appt for 9/10.

## 2013-01-07 ENCOUNTER — Ambulatory Visit: Payer: Self-pay

## 2013-01-07 ENCOUNTER — Other Ambulatory Visit: Payer: Self-pay | Admitting: Lab

## 2013-01-07 ENCOUNTER — Encounter: Payer: Self-pay | Admitting: Physician Assistant

## 2013-01-08 ENCOUNTER — Encounter (HOSPITAL_COMMUNITY)
Admission: RE | Admit: 2013-01-08 | Discharge: 2013-01-08 | Disposition: A | Discharge status: Home / Self Care | Payer: Self-pay | Source: Ambulatory Visit | Attending: Physician Assistant | Admitting: Physician Assistant

## 2013-01-08 ENCOUNTER — Encounter (HOSPITAL_COMMUNITY): Payer: Self-pay

## 2013-01-08 DIAGNOSIS — C50919 Malignant neoplasm of unspecified site of unspecified female breast: Secondary | ICD-10-CM | POA: Insufficient documentation

## 2013-01-08 DIAGNOSIS — M898X9 Other specified disorders of bone, unspecified site: Secondary | ICD-10-CM

## 2013-01-08 DIAGNOSIS — C50911 Malignant neoplasm of unspecified site of right female breast: Secondary | ICD-10-CM

## 2013-01-08 MED ORDER — TECHNETIUM TC 99M MEDRONATE IV KIT
25.0000 | PACK | Freq: Once | INTRAVENOUS | Status: AC | PRN
  Administered 2013-01-08: 25 via INTRAVENOUS

## 2013-01-09 ENCOUNTER — Ambulatory Visit (HOSPITAL_BASED_OUTPATIENT_CLINIC_OR_DEPARTMENT_OTHER): Payer: Self-pay | Admitting: Lab

## 2013-01-09 ENCOUNTER — Telehealth: Payer: Self-pay | Admitting: *Deleted

## 2013-01-09 ENCOUNTER — Ambulatory Visit (HOSPITAL_BASED_OUTPATIENT_CLINIC_OR_DEPARTMENT_OTHER): Payer: Self-pay | Admitting: Physician Assistant

## 2013-01-09 ENCOUNTER — Other Ambulatory Visit (HOSPITAL_BASED_OUTPATIENT_CLINIC_OR_DEPARTMENT_OTHER): Payer: Self-pay | Admitting: Lab

## 2013-01-09 ENCOUNTER — Ambulatory Visit (HOSPITAL_BASED_OUTPATIENT_CLINIC_OR_DEPARTMENT_OTHER): Payer: Self-pay

## 2013-01-09 ENCOUNTER — Ambulatory Visit: Payer: Self-pay | Admitting: Pharmacist

## 2013-01-09 ENCOUNTER — Encounter: Payer: Self-pay | Admitting: Physician Assistant

## 2013-01-09 VITALS — BP 148/97 | HR 82 | Resp 19 | Ht 65.0 in | Wt 142.3 lb

## 2013-01-09 DIAGNOSIS — R600 Localized edema: Secondary | ICD-10-CM

## 2013-01-09 DIAGNOSIS — C50919 Malignant neoplasm of unspecified site of unspecified female breast: Secondary | ICD-10-CM

## 2013-01-09 DIAGNOSIS — C773 Secondary and unspecified malignant neoplasm of axilla and upper limb lymph nodes: Secondary | ICD-10-CM

## 2013-01-09 DIAGNOSIS — G8929 Other chronic pain: Secondary | ICD-10-CM

## 2013-01-09 DIAGNOSIS — I82622 Acute embolism and thrombosis of deep veins of left upper extremity: Secondary | ICD-10-CM

## 2013-01-09 DIAGNOSIS — I82409 Acute embolism and thrombosis of unspecified deep veins of unspecified lower extremity: Secondary | ICD-10-CM

## 2013-01-09 DIAGNOSIS — I82729 Chronic embolism and thrombosis of deep veins of unspecified upper extremity: Secondary | ICD-10-CM

## 2013-01-09 DIAGNOSIS — M792 Neuralgia and neuritis, unspecified: Secondary | ICD-10-CM

## 2013-01-09 DIAGNOSIS — I82402 Acute embolism and thrombosis of unspecified deep veins of left lower extremity: Secondary | ICD-10-CM

## 2013-01-09 DIAGNOSIS — R51 Headache: Secondary | ICD-10-CM

## 2013-01-09 DIAGNOSIS — Z5111 Encounter for antineoplastic chemotherapy: Secondary | ICD-10-CM

## 2013-01-09 DIAGNOSIS — C50911 Malignant neoplasm of unspecified site of right female breast: Secondary | ICD-10-CM

## 2013-01-09 DIAGNOSIS — F341 Dysthymic disorder: Secondary | ICD-10-CM

## 2013-01-09 DIAGNOSIS — F419 Anxiety disorder, unspecified: Secondary | ICD-10-CM

## 2013-01-09 DIAGNOSIS — G40909 Epilepsy, unspecified, not intractable, without status epilepticus: Secondary | ICD-10-CM

## 2013-01-09 DIAGNOSIS — R29898 Other symptoms and signs involving the musculoskeletal system: Secondary | ICD-10-CM | POA: Insufficient documentation

## 2013-01-09 DIAGNOSIS — R110 Nausea: Secondary | ICD-10-CM

## 2013-01-09 DIAGNOSIS — C50912 Malignant neoplasm of unspecified site of left female breast: Secondary | ICD-10-CM

## 2013-01-09 LAB — CBC WITH DIFFERENTIAL/PLATELET
Basophils Absolute: 0 10*3/uL (ref 0.0–0.1)
Eosinophils Absolute: 0.3 10*3/uL (ref 0.0–0.5)
HCT: 43.7 % (ref 34.8–46.6)
HGB: 15.5 g/dL (ref 11.6–15.9)
MCV: 93.8 fL (ref 79.5–101.0)
MONO%: 3.8 % (ref 0.0–14.0)
NEUT#: 5.2 10*3/uL (ref 1.5–6.5)
NEUT%: 68.9 % (ref 38.4–76.8)
RDW: 12.7 % (ref 11.2–14.5)
lymph#: 1.8 10*3/uL (ref 0.9–3.3)

## 2013-01-09 LAB — PROTIME-INR: INR: 1.9 — ABNORMAL LOW (ref 2.00–3.50)

## 2013-01-09 LAB — POCT INR: INR: 1.9

## 2013-01-09 MED ORDER — GOSERELIN ACETATE 10.8 MG ~~LOC~~ IMPL
10.8000 mg | DRUG_IMPLANT | SUBCUTANEOUS | Status: DC
  Administered 2013-01-09: 10.8 mg via SUBCUTANEOUS
  Filled 2013-01-09: qty 10.8

## 2013-01-09 NOTE — Progress Notes (Signed)
INR = 1.9 on Lovenox 100 mg SQ daily & Coumadin 7.5 mg/day Pt still has L axillary swelling related to DVT. Bruising in abdomen from Lovenox injections. Complains of burning pain in her R side today.  The pain is intermittent & does move around.   She states she has more energy today.  She seems to be in better spirits today. She restarted Celexa. She did check urine pregnancy test last week: negative x 3.  She will get Zoladex injection today after she sees Zollie Scale, [place]. She drank 3-4 beers on Saturday (9/6) & had 3-4 beers yesterday.  This helps her deal w/ her pain she says. INR has risen nicely in the past 5 days to therapeutic range. I will have her stop the Lovenox (completed 13 days total).  I will increase her Coumadin slightly (and cautiously due to restart of Celexa & the EtOH consumption) to 7.5 mg/day except 10 mg on Wednesday. Recheck in 6 days. Ebony Hail, Pharm.D., CPP 01/09/2013@11 :11 AM

## 2013-01-09 NOTE — Patient Instructions (Addendum)
Goserelin injection What is this medicine? GOSERELIN (GOE se rel in) is similar to a hormone found in the body. It lowers the amount of sex hormones that the body makes. Men will have lower testosterone levels and women will have lower estrogen levels while taking this medicine. In men, this medicine is used to treat prostate cancer; the injection is either given once per month or once every 12 weeks. A once per month injection (only) is used to treat women with endometriosis, dysfunctional uterine bleeding, or advanced breast cancer. This medicine may be used for other purposes; ask your health care provider or pharmacist if you have questions. What should I tell my health care provider before I take this medicine? They need to know if you have any of these conditions (some only apply to women): -diabetes -heart disease or previous heart attack -high blood pressure -high cholesterol -kidney disease -osteoporosis or low bone density -problems passing urine -spinal cord injury -stroke -tobacco smoker -an unusual or allergic reaction to goserelin, hormone therapy, other medicines, foods, dyes, or preservatives -pregnant or trying to get pregnant -breast-feeding How should I use this medicine? This medicine is for injection under the skin. It is given by a health care professional in a hospital or clinic setting. Men receive this injection once every 4 weeks or once every 12 weeks. Women will only receive the once every 4 weeks injection. Talk to your pediatrician regarding the use of this medicine in children. Special care may be needed. Overdosage: If you think you have taken too much of this medicine contact a poison control center or emergency room at once. NOTE: This medicine is only for you. Do not share this medicine with others. What if I miss a dose? It is important not to miss your dose. Call your doctor or health care professional if you are unable to keep an appointment. What may  interact with this medicine? -female hormones like estrogen -herbal or dietary supplements like black cohosh, chasteberry, or DHEA -female hormones like testosterone -prasterone This list may not describe all possible interactions. Give your health care provider a list of all the medicines, herbs, non-prescription drugs, or dietary supplements you use. Also tell them if you smoke, drink alcohol, or use illegal drugs. Some items may interact with your medicine. What should I watch for while using this medicine? Visit your doctor or health care professional for regular checks on your progress. Your symptoms may appear to get worse during the first weeks of this therapy. Tell your doctor or healthcare professional if your symptoms do not start to get better or if they get worse after this time. Your bones may get weaker if you take this medicine for a long time. If you smoke or frequently drink alcohol you may increase your risk of bone loss. A family history of osteoporosis, chronic use of drugs for seizures (convulsions), or corticosteroids can also increase your risk of bone loss. Talk to your doctor about how to keep your bones strong. This medicine should stop regular monthly menstration in women. Tell your doctor if you continue to menstrate. Women should not become pregnant while taking this medicine or for 12 weeks after stopping this medicine. Women should inform their doctor if they wish to become pregnant or think they might be pregnant. There is a potential for serious side effects to an unborn child. Talk to your health care professional or pharmacist for more information. Do not breast-feed an infant while taking this medicine. Men should   inform their doctors if they wish to father a child. This medicine may lower sperm counts. Talk to your health care professional or pharmacist for more information. What side effects may I notice from receiving this medicine? Side effects that you should  report to your doctor or health care professional as soon as possible: -allergic reactions like skin rash, itching or hives, swelling of the face, lips, or tongue -bone pain -breathing problems -changes in vision -chest pain -feeling faint or lightheaded, falls -fever, chills -pain, swelling, warmth in the leg -pain, tingling, numbness in the hands or feet -swelling of the ankles, feet, hands -trouble passing urine or change in the amount of urine -unusually high or low blood pressure -unusually weak or tired Side effects that usually do not require medical attention (report to your doctor or health care professional if they continue or are bothersome): -change in sex drive or performance -changes in breast size in both males and females -changes in emotions or moods -headache -hot flashes -irritation at site where injected -loss of appetite -skin problems like acne, dry skin -vaginal dryness This list may not describe all possible side effects. Call your doctor for medical advice about side effects. You may report side effects to FDA at 1-800-FDA-1088. Where should I keep my medicine? This drug is given in a hospital or clinic and will not be stored at home. NOTE: This sheet is a summary. It may not cover all possible information. If you have questions about this medicine, talk to your doctor, pharmacist, or health care provider.  2013, [employer]. (09/02/2008 1:28:29 PM)  

## 2013-01-09 NOTE — Progress Notes (Signed)
ID: [name]   DOB: [birth year]  MR#: [id number]  WUJ#:[id number]  NFA:OZHYQMV,HQIONGE, MD GYN: SURoyston Sinner, MD OTHER:  [name], MD    HISTORY OF PRESENT ILLNESS: The patient is a 37 year old [place] woman who noted a mass in her right breast and brought it to her primary care physician's attention June of 2012. She was set up for mammography at [facility], where an area of calcifications highly suspicious for carcinoma was biopsied. This was in the upper outer quadrant and it measured 1.7 cm mammographically and 1.8 cm by ultrasonography. The pathology report ([id number]) showed a high-grade invasive ductal carcinoma estrogen receptor positive at 64% progesterone receptor positive at 18%, and HER-2 amplified with a ratio by CISH of 5.24. MIB-1-1 was 65%. A second mass biopsied at the same time was also triple positive.  Given her multiple masses in the right breast, mastectomy was recommended. The patient actually opted for bilateral mastectomies and this was performed together with a right axillary lymph node dissection under Dr. Felicity Pellegrini on 11/16/2010. The final pathology [id number]) showed, on the left, no evidence of cancer. On the right the patient had a high-grade invasive ductal carcinoma measuring 1.9 cm, with negative margins, grade 3, involving 2 of 16 lymph nodes (stage IIB).  Her subsequent history is as detailed below.  INTERVAL HISTORY:  Shadavia returns alone today for followup of her breast cancer, chronic pain, and recently recently diagnosed left upper extremity DVT. She's been followed by the Coumadin clinic, and continues on oral Coumadin. She discontinued tamoxifen as instructed on 12/27/2012.  She is due for her next q. 3 month Zoladex injection today.  Due to her diffuse pain, we decided to complete restaging studies with a bone scan, obtained yesterday, 01/08/2013. Fortunately, there is no evidence of metastatic disease. Unfortunately, Adeleigh continues to have  significant pain. She describes this as deep bony pain, in addition to burning neuropathic pain.  She's had increased numbness in addition to weakness in the upper extremities for the past week. She's also had some increased headaches over the past couple weeks and also complains of nausea. She's had no actual emesis. She's had no significant dizziness, no seizure activity, no gait disturbance, and no loss of consciousness. She does feel like her vision is increasingly blurred, more so in the left side than the right. She denies any diplopia.  Laityn continues to take gabapentin, 1200 mg by mouth 3 times a day which she finds helps slightly with her pain. We have not been prescribing narcotic pain medication due to the fact that, thus far, there is no evidence that any of her pain is currently associated with her breast cancer diagnosis. She cannot take tramadol due to her seizure history and the fact that she continues on Celexa.  REVIEW OF SYSTEMS: Nioma has had no fevers or chills. She continues to have anxiety and depression which are chronic and stable. She has no suicidal ideation. She's had no signs of abnormal bleeding, although she is bruising very easily. Her appetite is fair. She denies any change in bowel habits. She denies any increased cough, shortness of breath, or chest pain.   A detailed review of systems is otherwise stable and noncontributory.   PAST MEDICAL HISTORY: Past Medical History  Diagnosis Date  . Status post chemotherapy     docetaxel/carboplatin/trastuzumab  . Anxiety   . Hx MRSA infection 03/28/11    Skin  . History of breast cancer DX JUNE 2012 W/ RIGHT  BREAST INVASIVE DUCTAL CA  STAGE IIB---  S/P BILATERAL MASTECTOMIES AND RIGHT NODE DISSECTION    ONCOLOGIST-- DR [name]--  CHEMO ENDED DEC 2012 / RADIATION ENDED MAR 2013--  CURRENT ON TAMOXIFEN AND ZOLADEX  . Family history of anesthesia complication     MOTHER PONV  . Hot flashes due to tamoxifen   .  History of idiopathic seizure     DX 2008 --  LAST ONE 2012--  NO ISSUES SINCE AND NO MEDS. -- PT STATES DOCTORS FELT IT WAS STRESS RELATED DUE TO HEALTH ISSUES  . Bipolar affective disorder, mixed   . GERD (gastroesophageal reflux disease)     OCCASIONALLY TAKE ZANTAC  . Left ovarian cyst   . Right lower quadrant abdominal pain   . Frequency of urination   . Urgency of urination   . Strains to urinate   . Nocturia   . Heart palpitations   . Cancer   . Depression   . Seizures   1. History of seizures. The patient has been thoroughly evaluated both here by Dr. [name] and at [facility] for this. Among many studies, she had an MRI of the brain on August 28th. This showed an incidental solitary small lesion in the left frontoparietal white matter. It was not felt to be suggestive of multiple sclerosis. A noncontrasted CT in August 2010 was negative. The patient has been off all seizure medications now for 2 months. There has been no evidence of seizure recurrence. 2. Complex psychology history (please refer to Dr. [name] note in the E-chart from August 29, 2009) with evidence of anxiety disorder, depression and psychosis. The patient is currently on no medications for this and denies any of these symptoms at present. 3. History of migraines. 4. Remote history of multidrug abuse. 5. Status post wisdom teeth extraction.         History of MRSA skin infection   PAST SURGICAL HISTORY: Past Surgical History  Procedure Laterality Date  . Wisdom tooth extraction    . Biopsy breast  10/14/2010    right needle core biopsy  . Portacath placement  11/16/2010    placement of left subclavian port  . I&d extremity  09/26/2011    Procedure: IRRIGATION AND DEBRIDEMENT EXTREMITY;  Surgeon: [name] Ribas, MD;  Location: [facility];  Service: Orthopedics;  Laterality: Right;  I&D right hand cat bite wound  . Breast surgery  11/16/2010    bilateral mastectomy+ right axillary node dissection,T1cN1a, Her2+,ERPR+   (right breast invasive ductal carcinoma)  . Transthoracic echocardiogram  05-28-2012    LVF NORMAL/  EF 55-60%  . Cysto with hydrodistension N/A 07/23/2012    Procedure: CYSTOSCOPY/HYDRODISTENSION instillation of marcaine and pyridium;  Surgeon: Martina Sinner, MD;  Location: [facility] [place];  Service: Urology;  Laterality: N/A;  INSTILLATION      FAMILY HISTORY Family History  Problem Relation Age of Onset  . Cancer Maternal Grandfather   . Mental illness Brother   . Mental illness Son   The patient's mother is living, in her mid 42s, with no history of breast or ovarian cancer. The patient does not know her biological father. The patient has an older brother with bipolar disease, and according to the patient, borderline schizophrenia.   GYNECOLOGIC HISTORY: Menarche at age 14. She is G1, P1 with first pregnancy to term at age 12. The patient had significant pelvic pain recently and was evaluated with transvaginal and pelvic ultrasound on July 17th by Dr. Natale Milch. This  showed normal uterine myometrium and endometrium with bilateral functional ovarian cysts. There were no worrisome features. She is currently on every three-month goserelin.  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 Her husband of 4 years, [name], works as a Administrator. In addition to them at home is the patient's son, [name], 51 years old attends [employer] in the 11TH grade. [name], according to the patient, has significant nausea problems. The patient attends a local [address]. [name] is pastor.    ADVANCED DIRECTIVES: Not in place  HEALTH MAINTENANCE: History  Substance Use Topics  . Smoking status: Current Every Day Smoker -- 1.00 packs/day for 20 years    Types: Cigarettes  . Smokeless tobacco: Never Used  . Alcohol Use: Yes     Colonoscopy:  PAP:  Bone density:  Lipid panel:  Allergies  Allergen  Reactions  . Tramadol Other (See Comments)    Seizures  . Phenytoin Nausea And Vomiting  . Thorazine [Chlorpromazine Hcl] Hives  . Ciprofloxacin Nausea And Vomiting  . Vancomycin Rash    Possible rash reported by MD    Current Outpatient Prescriptions  Medication Sig Dispense Refill  . acetaminophen (TYLENOL) 500 MG tablet Take 1,000 mg by mouth every 8 (eight) hours as needed for pain.      Marland Kitchen ALPRAZolam (XANAX) 1 MG tablet Take 1 three times a day for anxiety.  90 tablet  4  . carvedilol (COREG) 6.25 MG tablet Take 6.25 mg by mouth daily as needed. For heart palpitations (rapid heart beat)      . citalopram (CELEXA) 40 MG tablet Take 40 mg by mouth daily.      Marland Kitchen gabapentin (NEURONTIN) 600 MG tablet Take 2 tablets (1,200 mg total) by mouth 3 (three) times daily.  180 tablet  1  . goserelin (ZOLADEX) 10.8 MG injection Inject 10.8 mg into the skin every 3 (three) months.      . ranitidine (ZANTAC) 150 MG tablet Take 150 mg by mouth 2 (two) times daily as needed for heartburn.      . warfarin (COUMADIN) 5 MG tablet Take 1 tablet (5 mg total) by mouth daily.  30 tablet  3  . oxyCODONE-acetaminophen (ROXICET) 5-325 MG per tablet Take 1 tablet by mouth every 8 (eight) hours as needed for pain.  15 tablet  0   No current facility-administered medications for this visit.   Facility-Administered Medications Ordered in Other Visits  Medication Dose Route Frequency Provider Last Rate Last Dose  . bupivacaine (MARCAINE) 0.5 % 15 mL, phenazopyridine (PYRIDIUM) 400 mg bladder mixture   Bladder Instillation Once Martina Sinner, MD       OBJECTIVE: Young white female who appears uncomfortable and anxious Filed Vitals:   01/09/13 1109  BP: 148/97  Pulse: 82  Resp: 19  ECOG: 1 Body mass index is 23.68 kg/(m^2).  Filed Weights   01/09/13 1109  Weight: 142 lb 4.8 oz (64.547 kg)    Physical Exam:  Sclerae anicteric, PERRLA, EOM intact Port is intact in the left chest wall with no  erythema. There is stable edema in the upper left chest wall and left neck, and also in the left axilla, stable compared to last week.  Patient status post bilateral mastectomies, no evidence of local recurrence. There is increased vascularity present on the left upper chest wall. Lungs clear to auscultation bilaterally with no crackles or wheezes Heart regular  rate and rhythm Neuro:  Nonfocal, well oriented, anxious affect.  Cranial nerves grossly intact. Strength is 4/5 bilaterally in the upper extremities.  Remainder of physical exam was deferred today.   LAB RESULTS: Lab Results  Component Value Date   WBC 7.6 01/09/2013   NEUTROABS 5.2 01/09/2013   HGB 15.5 01/09/2013   HCT 43.7 01/09/2013   MCV 93.8 01/09/2013   PLT 194 01/09/2013      Chemistry      Component Value Date/Time   NA 145 01/01/2013 1211   NA 138 08/22/2012 1700   K 4.1 01/01/2013 1211   K 3.7 08/22/2012 1700   CL 105 10/11/2012 1441   CL 103 08/22/2012 1700   CO2 24 01/01/2013 1211   CO2 23 08/22/2012 1700   BUN 9.6 01/01/2013 1211   BUN 11 08/22/2012 1700   CREATININE 0.8 01/01/2013 1211   CREATININE 0.96 08/22/2012 1700      Component Value Date/Time   CALCIUM 9.9 01/01/2013 1211   CALCIUM 9.1 08/22/2012 1700   ALKPHOS 71 01/01/2013 1211   ALKPHOS 87 08/22/2012 1700   AST 23 01/01/2013 1211   AST 33 08/22/2012 1700   ALT 21 01/01/2013 1211   ALT 24 08/22/2012 1700   BILITOT 0.30 01/01/2013 1211   BILITOT 0.2* 08/22/2012 1700         STUDIES:  Nm Bone Scan Whole Body  01/08/2013   *RADIOLOGY REPORT*  Clinical Data: Restaging breast cancer.  NUCLEAR MEDICINE WHOLE BODY BONE SCINTIGRAPHY  Technique:  Whole body anterior and posterior images were obtained approximately 3 hours after intravenous injection of radiopharmaceutical.  Radiopharmaceutical: CURIE TC-MDP TECHNETIUM TC 78M MEDRONATE IV KIT  Comparison: 10/27/2010  Findings: There is expected uptake in the renal collecting system. No suspicious uptake involving the  axial or appendicular skeleton. No abnormal uptake in the ribs.  IMPRESSION: Negative whole body bone scan.  No evidence for metastatic bone disease.   Original Report Authenticated By: Richarda Overlie, M.D.     ASSESSMENT: 37 y.o.  [place] woman, known to be BRCA1 and 2 negative,   (1) status post bilateral mastectomies May of 2012 for a right-sided T1c N1 (stage II) grade 3 invasive ductal carcinoma,estrogen and progesterone receptor positive, HER-2 amplified, with an MIB-1-1 of 85%.   (2) status post carboplatin/docetaxol/trastuzumab x6, completed mid December 2012   (3) on trastuzumab every 3 weeks, started August of 2012, interrupted between April and August of 2013, completed 04/27/2012. Most recent echo 03/19/2012  (4) status post radiation therapy, completed 07/12/2011  (5) On tamoxifen since April 2013, held as of 12/27/2012 with the diagnosis of a left upper extremity DVT.  On therapeutic Coumadin since that time.   (6) On goserelin given every 3 months, 1st injection on 10/26/11, most recent dose 10/11/2012.    PLAN:  The majority of our 45 minute appointment today was spent reviewing Andres's concerns, reviewing recent scan results, discussing treatment options, and coordinating care.   Fortunately, her bone scan was unremarkable.  Unfortunately, she continues to have significant pain. In going to check some additional labs today, including a TSH, ANA, C reactive protein, and rheumatoid factor.  I'm concerned about the increased headaches with nausea, in addition to the numbness and weakness in the upper extremities. Accordingly, I am going to request MRIs of the brain and cervical spine to evaluate for any brain lesions  Or changes in the cervical spine, although I don't expect to see anything associated with her breast cancer.  She's been seen by Dr. Marjory Lies in the past for her history of headaches with seizure activity, and I'm referring her back to their office as soon as  possible for further evaluation.  We again discussed pain control. She continues on gabapentin as noted above. We discussed a prescription for oxycodone/APAP or hydrocodone/APAP.  I really do not think that these would be the medications of choice due to the characteristics of her pain.  She has tried both Lyrica and Cymbalta in the past with minimal relief. She'll continue on the gabapentin, and I have encouraged her to take Tylenol 3 times daily when she takes the gabapentin to see if this helps with the pain. Of course she needs to be cautious of NSAIDs secondary to being on anticoagulation therapy. I'm also recommending a referral to the pain clinic, and we'll try to get her appointment with Dr. Manon Hilding for further evaluation.  Laiba will receive her Zoladex injection today as scheduled. She'll continue to be followed by the Coumadin clinic, and of course will continue to be off tamoxifen.  I will see her briefly again next week to review these scan results and discuss her treatment plan. Otherwise, she'll return to see Dr. [name] in November at which time they will discuss her future treatment plan, likely to include aromatase inhibitor with continued Zoladex. Sterling voice understanding and agreement with this plan. She knows to call with any changes prior to her next appointment.   Rubel Heckard    01/09/2013

## 2013-01-09 NOTE — Telephone Encounter (Signed)
All her appts was made and printed. Except for Dr. Dutch Gray sw Diane and she stated that she would schedule and call the pt. Pt is aware...td

## 2013-01-10 ENCOUNTER — Telehealth: Payer: Self-pay | Admitting: *Deleted

## 2013-01-10 NOTE — Telephone Encounter (Signed)
sw pt gv appt for lab on 01/16/13 @ 10:15am. i also made the pt aware that her info has been faxed to [facility] and they will call her w/ an appt. GV Kim the info to fax...td

## 2013-01-11 ENCOUNTER — Other Ambulatory Visit: Payer: Self-pay | Admitting: *Deleted

## 2013-01-11 ENCOUNTER — Telehealth: Payer: Self-pay | Admitting: *Deleted

## 2013-01-11 DIAGNOSIS — I82402 Acute embolism and thrombosis of unspecified deep veins of left lower extremity: Secondary | ICD-10-CM

## 2013-01-11 MED ORDER — WARFARIN SODIUM 5 MG PO TABS: 5.0000 mg | ORAL_TABLET | Freq: Every day | ORAL | Status: DC

## 2013-01-11 NOTE — Telephone Encounter (Signed)
Voice mail requesting a call from Zollie Scale, PA to call her regarding pain. Pain is worse despite the neurontin and walking is difficult. Says "I'm hardly able to move". Called back and made her aware that she needs to go to the emergency room if she needs acute pain relief. A referral has been made for pain clinic by this office. Reminded her that her pain is unrelated to her oncology issue and needs to be managed by physician experienced in her type of pain. Will make PA aware that she called.

## 2013-01-15 ENCOUNTER — Other Ambulatory Visit: Payer: Self-pay | Admitting: Physician Assistant

## 2013-01-15 ENCOUNTER — Ambulatory Visit (HOSPITAL_COMMUNITY)
Admission: RE | Admit: 2013-01-15 | Discharge: 2013-01-15 | Disposition: A | Discharge status: Home / Self Care | Payer: Self-pay | Source: Ambulatory Visit | Attending: Physician Assistant | Admitting: Physician Assistant

## 2013-01-15 ENCOUNTER — Ambulatory Visit (HOSPITAL_BASED_OUTPATIENT_CLINIC_OR_DEPARTMENT_OTHER): Payer: Self-pay | Admitting: Pharmacist

## 2013-01-15 ENCOUNTER — Other Ambulatory Visit (HOSPITAL_BASED_OUTPATIENT_CLINIC_OR_DEPARTMENT_OTHER): Payer: Self-pay | Admitting: Lab

## 2013-01-15 DIAGNOSIS — C50919 Malignant neoplasm of unspecified site of unspecified female breast: Secondary | ICD-10-CM

## 2013-01-15 DIAGNOSIS — I82729 Chronic embolism and thrombosis of deep veins of unspecified upper extremity: Secondary | ICD-10-CM

## 2013-01-15 DIAGNOSIS — R209 Unspecified disturbances of skin sensation: Secondary | ICD-10-CM | POA: Insufficient documentation

## 2013-01-15 DIAGNOSIS — R609 Edema, unspecified: Secondary | ICD-10-CM

## 2013-01-15 DIAGNOSIS — R269 Unspecified abnormalities of gait and mobility: Secondary | ICD-10-CM | POA: Insufficient documentation

## 2013-01-15 DIAGNOSIS — R4789 Other speech disturbances: Secondary | ICD-10-CM | POA: Insufficient documentation

## 2013-01-15 DIAGNOSIS — G40909 Epilepsy, unspecified, not intractable, without status epilepticus: Secondary | ICD-10-CM

## 2013-01-15 DIAGNOSIS — R5381 Other malaise: Secondary | ICD-10-CM | POA: Insufficient documentation

## 2013-01-15 DIAGNOSIS — R29898 Other symptoms and signs involving the musculoskeletal system: Secondary | ICD-10-CM

## 2013-01-15 DIAGNOSIS — R600 Localized edema: Secondary | ICD-10-CM

## 2013-01-15 DIAGNOSIS — C50911 Malignant neoplasm of unspecified site of right female breast: Secondary | ICD-10-CM

## 2013-01-15 DIAGNOSIS — I82409 Acute embolism and thrombosis of unspecified deep veins of unspecified lower extremity: Secondary | ICD-10-CM

## 2013-01-15 DIAGNOSIS — M792 Neuralgia and neuritis, unspecified: Secondary | ICD-10-CM

## 2013-01-15 DIAGNOSIS — IMO0002 Reserved for concepts with insufficient information to code with codable children: Secondary | ICD-10-CM

## 2013-01-15 DIAGNOSIS — I82402 Acute embolism and thrombosis of unspecified deep veins of left lower extremity: Secondary | ICD-10-CM

## 2013-01-15 DIAGNOSIS — M5030 Other cervical disc degeneration, unspecified cervical region: Secondary | ICD-10-CM | POA: Insufficient documentation

## 2013-01-15 LAB — PROTIME-INR
INR: 2.4 (ref 2.00–3.50)
Protime: 28.8 Seconds — ABNORMAL HIGH (ref 10.6–13.4)

## 2013-01-15 LAB — POCT INR: INR: 2.4

## 2013-01-15 MED ORDER — WARFARIN SODIUM 5 MG PO TABS: 7.5000 mg | ORAL_TABLET | Freq: Every day | ORAL | Status: DC

## 2013-01-15 MED ORDER — GADOBENATE DIMEGLUMINE 529 MG/ML IV SOLN
12.0000 mL | Freq: Once | INTRAVENOUS | Status: AC | PRN
  Administered 2013-01-15: 12 mL via INTRAVENOUS

## 2013-01-15 NOTE — Progress Notes (Signed)
INR within goal today. No problems to report regarding anticoagulation. Usual bruising. No bleeding. Abdominal bruising improving from Lovenox injections. Left axillary swelling is decreasing. Pt is in good spirits today. She has been using "celebratory drinks" occasionally to help her with her pain on her bad days. She is waiting to be seen in the pain clinic. Referral has been made. Pt continues on the same medications. No medication changes. Percocet deleted from her medication list. She doesn't take it. No changes in diet. No missed coumadin doses. Pt has been taking 7.5mg  daily (not 7.5mg  daily except 10mg  on Wed as noted). Will continue Coumadin 7.5 mg daily (1&1/2 tablets). Recheck INR on Wed 01/23/13 at 10:30am for lab and 10:45am for coumadin clinic.  An refill prescription for coumadin (5mg  tablets) with the updated dosing instructions was faxed to the [facility] on [address] per pt request.

## 2013-01-15 NOTE — Patient Instructions (Addendum)
Continue Coumadin 7.5 mg daily (1 1/2 tablets). Recheck INR on Wed 01/23/13 at 10:30am for lab and 10:45am for coumadin clinic. Keep up the good work and positive attitude!!

## 2013-01-16 ENCOUNTER — Ambulatory Visit (HOSPITAL_BASED_OUTPATIENT_CLINIC_OR_DEPARTMENT_OTHER): Payer: Self-pay | Admitting: Physician Assistant

## 2013-01-16 ENCOUNTER — Other Ambulatory Visit: Payer: Self-pay | Admitting: Lab

## 2013-01-16 ENCOUNTER — Encounter: Payer: Self-pay | Admitting: Physician Assistant

## 2013-01-16 ENCOUNTER — Telehealth: Payer: Self-pay | Admitting: *Deleted

## 2013-01-16 VITALS — BP 121/96 | HR 89 | Temp 97.5°F | Resp 18 | Ht 65.0 in | Wt 142.1 lb

## 2013-01-16 DIAGNOSIS — G8929 Other chronic pain: Secondary | ICD-10-CM

## 2013-01-16 DIAGNOSIS — R29898 Other symptoms and signs involving the musculoskeletal system: Secondary | ICD-10-CM

## 2013-01-16 DIAGNOSIS — F411 Generalized anxiety disorder: Secondary | ICD-10-CM

## 2013-01-16 DIAGNOSIS — R252 Cramp and spasm: Secondary | ICD-10-CM

## 2013-01-16 DIAGNOSIS — M792 Neuralgia and neuritis, unspecified: Secondary | ICD-10-CM

## 2013-01-16 DIAGNOSIS — R569 Unspecified convulsions: Secondary | ICD-10-CM

## 2013-01-16 DIAGNOSIS — C50919 Malignant neoplasm of unspecified site of unspecified female breast: Secondary | ICD-10-CM

## 2013-01-16 DIAGNOSIS — I82622 Acute embolism and thrombosis of deep veins of left upper extremity: Secondary | ICD-10-CM

## 2013-01-16 DIAGNOSIS — I82629 Acute embolism and thrombosis of deep veins of unspecified upper extremity: Secondary | ICD-10-CM

## 2013-01-16 DIAGNOSIS — IMO0001 Reserved for inherently not codable concepts without codable children: Secondary | ICD-10-CM

## 2013-01-16 DIAGNOSIS — IMO0002 Reserved for concepts with insufficient information to code with codable children: Secondary | ICD-10-CM

## 2013-01-16 DIAGNOSIS — F419 Anxiety disorder, unspecified: Secondary | ICD-10-CM

## 2013-01-16 DIAGNOSIS — M797 Fibromyalgia: Secondary | ICD-10-CM

## 2013-01-16 DIAGNOSIS — C50911 Malignant neoplasm of unspecified site of right female breast: Secondary | ICD-10-CM

## 2013-01-16 MED ORDER — DIAZEPAM 2 MG PO TABS: 2.0000 mg | ORAL_TABLET | Freq: Every day | ORAL | Status: DC | PRN

## 2013-01-16 NOTE — Telephone Encounter (Signed)
sw pt made her aware that her labs for today are cancel...td

## 2013-01-16 NOTE — Progress Notes (Signed)
ID: Crystal [name]   DOB: [birth year]  MR#: [id number]  NWG#:[id number]  VHQ:[id number], MD GYN: SURoyston Sinner, MD OTHER:  [name], MD    HISTORY OF PRESENT ILLNESS: The patient is a 37 year old [place] woman who noted a mass in her right breast and brought it to her primary care physician's attention June of 2012. She was set up for mammography at [facility], where an area of calcifications highly suspicious for carcinoma was biopsied. This was in the upper outer quadrant and it measured 1.7 cm mammographically and 1.8 cm by ultrasonography. The pathology report ([id number]) showed a high-grade invasive ductal carcinoma estrogen receptor positive at 64% progesterone receptor positive at 18%, and HER-2 amplified with a ratio by CISH of 5.24. MIB-1-1 was 65%. A second mass biopsied at the same time was also triple positive.  Given her multiple masses in the right breast, mastectomy was recommended. The patient actually opted for bilateral mastectomies and this was performed together with a right axillary lymph node dissection under Dr. Felicity Pellegrini on 11/16/2010. The final pathology [id number]) showed, on the left, no evidence of cancer. On the right the patient had a high-grade invasive ductal carcinoma measuring 1.9 cm, with negative margins, grade 3, involving 2 of 16 lymph nodes (stage IIB).  Her subsequent history is as detailed below.  INTERVAL HISTORY:  Crystal [name] returns alone today for followup of her breast cancer, chronic pain, and recently recently diagnosed left upper extremity DVT. She's been followed by the Coumadin clinic, and continues on oral Coumadin with a therapeutic INR of 2.4. She discontinued tamoxifen as instructed on 12/27/2012.  She continues to receive Zoladex injections, last given 01/09/2013.   Crystal [name] continues to have diffuse pain, and that is her biggest complaint today. Of course this is a long-standing complaint. She tells me the "pain moves around"; sometimes  she hurts in her arms, and sometimes in her legs. Some days, she hurts everywhere. In addition to a burning neuropathic pain, she describes a deep bony pain that is a constant ache. She often feels weak and is tired most of the time. She feels like the gabapentin helps slightly, but nothing "makes the pain go away". She still has headaches that come and go in addition to occasional episodes of nausea. Fortunately she's had no emesis and she continues to deny any dizziness, seizure activity, gait disturbance, or significant changes in vision.  Interval history is notable for further evaluation, primarily to further assess her pain and ruled out other comorbidities. A brain MRI on 01/15/2013 was unremarkable, and MRI of the cervical spine showed only some mild degenerative disease but no nerve compression. Labs last week were all unremarkable, and these included TSH, rheumatoid factor, C-reactive Protein, and ANA in addition to her regular CBC.   REVIEW OF SYSTEMS: Crystal [name] has had no fevers or chills. She continues to have anxiety and depression which are chronic and stable. She has no suicidal ideation. She's had no signs of abnormal bleeding, although she bruises very easily. Her appetite is fair. She denies any diarrhea or constipation. She denies any increased cough, shortness of breath, palpitations or chest pain. She's had no peripheral swelling other than some mild residual swelling in the left upper extremity. This is actually improving.  A detailed review of systems is otherwise stable and noncontributory.   PAST MEDICAL HISTORY: Past Medical History  Diagnosis Date  . Status post chemotherapy     docetaxel/carboplatin/trastuzumab  . Anxiety   . Hx MRSA infection  03/28/11    Skin  . History of breast cancer DX JUNE 2012 W/ RIGHT BREAST INVASIVE DUCTAL CA  STAGE IIB---  S/P BILATERAL MASTECTOMIES AND RIGHT NODE DISSECTION    ONCOLOGIST-- DR [name]--  CHEMO ENDED DEC 2012 / RADIATION ENDED  MAR 2013--  CURRENT ON TAMOXIFEN AND ZOLADEX  . Family history of anesthesia complication     MOTHER PONV  . Hot flashes due to tamoxifen   . History of idiopathic seizure     DX 2008 --  LAST ONE 2012--  NO ISSUES SINCE AND NO MEDS. -- PT STATES DOCTORS FELT IT WAS STRESS RELATED DUE TO HEALTH ISSUES  . Bipolar affective disorder, mixed   . GERD (gastroesophageal reflux disease)     OCCASIONALLY TAKE ZANTAC  . Left ovarian cyst   . Right lower quadrant abdominal pain   . Frequency of urination   . Urgency of urination   . Strains to urinate   . Nocturia   . Heart palpitations   . Cancer   . Depression   . Seizures   1. History of seizures. The patient has been thoroughly evaluated both here by Dr. [name] and at [facility] for this. Among many studies, she had an MRI of the brain on August 28th. This showed an incidental solitary small lesion in the left frontoparietal white matter. It was not felt to be suggestive of multiple sclerosis. A noncontrasted CT in August 2010 was negative. The patient has been off all seizure medications now for 2 months. There has been no evidence of seizure recurrence. 2. Complex psychology history (please refer to Dr. [name] note in the E-chart from August 29, 2009) with evidence of anxiety disorder, depression and psychosis. The patient is currently on no medications for this and denies any of these symptoms at present. 3. History of migraines. 4. Remote history of multidrug abuse. 5. Status post wisdom teeth extraction.         History of MRSA skin infection   PAST SURGICAL HISTORY: Past Surgical History  Procedure Laterality Date  . Wisdom tooth extraction    . Biopsy breast  10/14/2010    right needle core biopsy  . Portacath placement  11/16/2010    placement of left subclavian port  . I&d extremity  09/26/2011    Procedure: IRRIGATION AND DEBRIDEMENT EXTREMITY;  Surgeon: [name] Ribas, MD;  Location: [facility];  Service: Orthopedics;  Laterality:  Right;  I&D right hand cat bite wound  . Breast surgery  11/16/2010    bilateral mastectomy+ right axillary node dissection,T1cN1a, Her2+,ERPR+  (right breast invasive ductal carcinoma)  . Transthoracic echocardiogram  05-28-2012    LVF NORMAL/  EF 55-60%  . Cysto with hydrodistension N/A 07/23/2012    Procedure: CYSTOSCOPY/HYDRODISTENSION instillation of marcaine and pyridium;  Surgeon: Martina Sinner, MD;  Location: [facility];  Service: Urology;  Laterality: N/A;  INSTILLATION      FAMILY HISTORY Family History  Problem Relation Age of Onset  . Cancer Maternal Grandfather   . Mental illness Brother   . Mental illness Son   The patient's mother is living, in her mid 86s, with no history of breast or ovarian cancer. The patient does not know her biological father. The patient has an older brother with bipolar disease, and according to the patient, borderline schizophrenia.   GYNECOLOGIC HISTORY: Menarche at age 13. She is G1, P1 with first pregnancy to term at age 48. The patient had significant pelvic pain  recently and was evaluated with transvaginal and pelvic ultrasound on July 17th by Dr. Natale Milch. This showed normal uterine myometrium and endometrium with bilateral functional ovarian cysts. There were no worrisome features. She is currently on every three-month goserelin.  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 Her husband of 4 years, [name], works as a Administrator. In addition to them at home is the patient's son, [name], 3 years old attends [employer] in the 11TH grade. [name], according to the patient, has significant nausea problems. The patient attends a local [address]. [name] is pastor.    ADVANCED DIRECTIVES: Not in place  HEALTH MAINTENANCE: History  Substance Use Topics  . Smoking status: Current Every Day Smoker -- 1.00 packs/day for 20 years    Types:  Cigarettes  . Smokeless tobacco: Never Used  . Alcohol Use: Yes     Colonoscopy:  PAP:  Bone density:  Lipid panel:  Allergies  Allergen Reactions  . Tramadol Other (See Comments)    Seizures  . Phenytoin Nausea And Vomiting  . Thorazine [Chlorpromazine Hcl] Hives  . Ciprofloxacin Nausea And Vomiting  . Vancomycin Rash    Possible rash reported by MD    Current Outpatient Prescriptions  Medication Sig Dispense Refill  . acetaminophen (TYLENOL) 500 MG tablet Take 1,000 mg by mouth every 8 (eight) hours as needed for pain.      Marland Kitchen ALPRAZolam (XANAX) 1 MG tablet Take 1 three times a day for anxiety.  90 tablet  4  . carvedilol (COREG) 6.25 MG tablet Take 6.25 mg by mouth daily as needed. For heart palpitations (rapid heart beat)      . citalopram (CELEXA) 40 MG tablet Take 40 mg by mouth daily.      . diazepam (VALIUM) 2 MG tablet Take 1 tablet (2 mg total) by mouth daily as needed for anxiety (or muscle pain).  20 tablet  0  . gabapentin (NEURONTIN) 600 MG tablet Take 2 tablets (1,200 mg total) by mouth 3 (three) times daily.  180 tablet  1  . goserelin (ZOLADEX) 10.8 MG injection Inject 10.8 mg into the skin every 3 (three) months.      . ranitidine (ZANTAC) 150 MG tablet Take 150 mg by mouth 2 (two) times daily as needed for heartburn.      . warfarin (COUMADIN) 5 MG tablet Take 1.5 tablets (7.5 mg total) by mouth daily.  45 tablet  3   No current facility-administered medications for this visit.   Facility-Administered Medications Ordered in Other Visits  Medication Dose Route Frequency Provider Last Rate Last Dose  . bupivacaine (MARCAINE) 0.5 % 15 mL, phenazopyridine (PYRIDIUM) 400 mg bladder mixture   Bladder Instillation Once Martina Sinner, MD       OBJECTIVE: Young white female who appears her stated age. She appears uncomfortable and anxious on presentation. Filed Vitals:   01/16/13 1050  BP: 121/96  Pulse: 89  Temp: 97.5 F (36.4 C)  Resp: 18  ECOG: 1 Body  mass index is 23.65 kg/(m^2).  Filed Weights   01/16/13 1050  Weight: 142 lb 1.6 oz (64.456 kg)    Remainder of physical exam was deferred today.   LAB RESULTS: Lab Results  Component Value Date   WBC 7.6 01/09/2013   NEUTROABS 5.2 01/09/2013   HGB 15.5 01/09/2013   HCT 43.7 01/09/2013   MCV 93.8 01/09/2013   PLT 194  01/09/2013      Chemistry      Component Value Date/Time   NA 145 01/01/2013 1211   NA 138 08/22/2012 1700   K 4.1 01/01/2013 1211   K 3.7 08/22/2012 1700   CL 105 10/11/2012 1441   CL 103 08/22/2012 1700   CO2 24 01/01/2013 1211   CO2 23 08/22/2012 1700   BUN 9.6 01/01/2013 1211   BUN 11 08/22/2012 1700   CREATININE 0.8 01/01/2013 1211   CREATININE 0.96 08/22/2012 1700      Component Value Date/Time   CALCIUM 9.9 01/01/2013 1211   CALCIUM 9.1 08/22/2012 1700   ALKPHOS 71 01/01/2013 1211   ALKPHOS 87 08/22/2012 1700   AST 23 01/01/2013 1211   AST 33 08/22/2012 1700   ALT 21 01/01/2013 1211   ALT 24 08/22/2012 1700   BILITOT 0.30 01/01/2013 1211   BILITOT 0.2* 08/22/2012 1700         STUDIES:  Mr [name] RU Contrast  01/16/2013   *RADIOLOGY REPORT*  Clinical Data:  Speech disturbance.  Gait disturbance.  Numbness and weakness.  History breast cancer.  MRI HEAD WITHOUT AND WITH CONTRAST MRI CERVICAL SPINE WITHOUT AND WITH CONTRAST  Technique:  Multiplanar, multiecho pulse sequences of the brain and surrounding structures, and cervical spine, to include the craniocervical junction and cervicothoracic junction, were obtained without and with intravenous contrast.  Contrast: 12mL MULTIHANCE GADOBENATE DIMEGLUMINE 529 MG/ML IV SOLN  Comparison:  07/14/2011  MRI HEAD  Findings:  Diffusion imaging does not show any acute or subacute infarction.  Again demonstrated is abnormal signal throughout the pons.  This could relate to post treatment effect. A few foci of T2 and FLAIR signal in the left frontal white matter are unchanged. No evidence of mass lesion, hemorrhage, hydrocephalus or extra-  axial collection.  After contrast administration, no abnormal enhancement occurs.  No pituitary mass.  No inflammatory sinus disease.  No skull or skull base lesion.  IMPRESSION: No evidence of metastatic disease.  Chronic abnormal T2 signal within the pons, probably related to previous treatment.  MRI CERVICAL SPINE  Findings: Alignment is normal.  There is no evidence of metastatic disease in the region.  The foramen magnum is widely patent.  C1-2, C2-3, C3-4 and C4-5 are normal.  C5-6:  Bulging of the disc but no stenosis or neural compression.  C6-7:  Spondylosis with endplate osteophytes and shallow disc herniation.  Narrowing of ventral subarachnoid space but no compressive effect upon the cord.  Mild foraminal narrowing without gross neural compression.  C7-T1:  Normal.  No abnormal cord signal.  No abnormal contrast enhancement.  IMPRESSION: No evidence of metastatic disease.  Mild degenerative disease at C5- 6 and C6-7 without gross neural compression.   Original Report Authenticated By: Paulina Fusi, M.D.    Nm Bone Scan Whole Body  01/08/2013   *RADIOLOGY REPORT*  Clinical Data: Restaging breast cancer.  NUCLEAR MEDICINE WHOLE BODY BONE SCINTIGRAPHY  Technique:  Whole body anterior and posterior images were obtained approximately 3 hours after intravenous injection of radiopharmaceutical.  Radiopharmaceutical: CURIE TC-MDP TECHNETIUM TC 76M MEDRONATE IV KIT  Comparison: 10/27/2010  Findings: There is expected uptake in the renal collecting system. No suspicious uptake involving the axial or appendicular skeleton. No abnormal uptake in the ribs.  IMPRESSION: Negative whole body bone scan.  No evidence for metastatic bone disease.   Original Report Authenticated By: Richarda Overlie, M.D.      ASSESSMENT: 37 y.o.  [place] woman,  known to be BRCA1 and 2 negative,   (1) status post bilateral mastectomies May of 2012 for a right-sided T1c N1 (stage II) grade 3 invasive ductal carcinoma,estrogen  and progesterone receptor positive, HER-2 amplified, with an MIB-1-1 of 85%.   (2) status post carboplatin/docetaxol/trastuzumab x6, completed mid December 2012   (3) on trastuzumab every 3 weeks, started August of 2012, interrupted between April and August of 2013, completed 04/27/2012. Most recent echo 03/19/2012  (4) status post radiation therapy, completed 07/12/2011  (5) On tamoxifen since April 2013, held as of 12/27/2012 with the diagnosis of a left upper extremity DVT.  On therapeutic Coumadin since that time.   (6) On goserelin given every 3 months, 1st injection on 10/26/11, most recent dose 01/09/2013.   (7) Chronic Pain  (8)  Fibromyalgia-like syndrome, pending further evaluation through rheumatology   PLAN:  Our entire 30 minute appointment today was spent reviewing Crystal [name]'s concerns, reviewing recent scan results, discussing treatment options, placing referrals, and coordinating care.   Of course Crystal [name]'s primary concern continues to be her chronic pain. We reviewed her recent brain and cervical spine MRI which was unremarkable as noted above. We also reviewed all of her recent labs, all of which were normal. Based on her presentation and the fact that we have ruled out multiple other issues, I feel that Crystal [name] likely has fibromyalgia.    We discussed pain control. She cannot take tramadol due to the seizure risk. She is already taking Celexa as well as gabapentin. She has not tolerated Lyrica or Cymbalta in the past, nor has she tolerated Flexeril or Skelaxin well. I do not want to prescribe narcotic pain medications such as hydrocodone or oxycodone.  She currently takes Xanax which is prescribed through Dr. Cyndie Chime office, with instructions to take up to 3 times daily but she tells me she takes at a maximum twice daily. Diazepam has a good history of treating the type pain Ave has, and I am prescribing a very low dose of 2 mg to take only in the mornings. She was advised  to take no more than one tablet of diazepam in the morning, one tablet of Xanax in the afternoon, and 1 tablet of Xanax at bedtime. She knows that she needs to wean very slowly off the Xanax since she has been on it for quite some time. Of course she also knows that she should avoid all alcohol while taking these medications. She is given all of this information in writing today and voiced her agreement.  Crystal [name] was scheduled to see her neurologist next week but would like to cancel that appointment and schedule a referral with a rheumatologist instead. We will try to get her in as soon as possible for further evaluation of likely fibromyalgia. I've also placed a referral to the pain clinic which might help as well.   With the exception of some residual peripheral neuropathy which should be treated by the gabapentin, none of Crystal [name]'s issues at the current time are actually associated with her breast cancer diagnosis. Accordingly, I am also referring her back to her primary care physician, Dr. Patsy Lager, for further evaluation and review of her medication list.   Of course she will continue to be followed here on a regular basis for her diagnosis of breast cancer. She sees Dr. [name] on November 20, and at that point they will discuss the possibility of starting Crystal [name] on an aromatase inhibitor since she developed a DVT while on tamoxifen. Of course Zoladex  would be continued. She'll also continue on anticoagulation therapy and will be followed through our Coumadin Clinic as well.   Crystal [name] voiced understanding and agreement with this plan. She knows to call with any changes prior to her next appointment.   Crystal [name]    01/16/2013

## 2013-01-16 NOTE — Telephone Encounter (Signed)
Pt is aware that Dr. Dierdre Forth office will schedule her an appt. Pt will call and schedule her appt w/ [facility]...td

## 2013-01-17 ENCOUNTER — Telehealth: Payer: Self-pay | Admitting: Oncology

## 2013-01-17 NOTE — Telephone Encounter (Signed)
Faxed pt medical records to Dr. Beekman °

## 2013-01-17 NOTE — Progress Notes (Signed)
Encounter opened in error.  Zollie Scale, PA-C 01/17/2013

## 2013-01-22 ENCOUNTER — Institutional Professional Consult (permissible substitution): Payer: Self-pay | Admitting: Diagnostic Neuroimaging

## 2013-01-22 ENCOUNTER — Ambulatory Visit: Payer: Self-pay | Admitting: Family Medicine

## 2013-01-22 VITALS — BP 95/75 | HR 70 | Temp 97.8°F

## 2013-01-22 DIAGNOSIS — G894 Chronic pain syndrome: Secondary | ICD-10-CM

## 2013-01-22 DIAGNOSIS — I959 Hypotension, unspecified: Secondary | ICD-10-CM

## 2013-01-22 LAB — POCT CBC
HCT, POC: 48.7 % — AB (ref 37.7–47.9)
MCH, POC: 34.4 pg — AB (ref 27–31.2)
MCV: 101.7 fL — AB (ref 80–97)
MID (cbc): 0.5 (ref 0–0.9)
POC LYMPH PERCENT: 35.6 %L (ref 10–50)
Platelet Count, POC: 234 10*3/uL (ref 142–424)
RBC: 4.79 M/uL (ref 4.04–5.48)
RDW, POC: 12.9 %
WBC: 9.4 10*3/uL (ref 4.6–10.2)

## 2013-01-22 LAB — GLUCOSE, POCT (MANUAL RESULT ENTRY): POC Glucose: 154 mg/dl — AB (ref 70–99)

## 2013-01-22 NOTE — Patient Instructions (Addendum)
Come and see me on Thursday and we will follow- up your blood pressure.  In the meantime be sure to eat and drink well.  Hold your coreg medication in the meantime.    Let me know if you are having any trouble and seek care right away if you have any more sx of low blood pressure.

## 2013-01-22 NOTE — Progress Notes (Signed)
[facility] [address], [place] [address] [phone]  Date:  01/22/2013   Name:  Crystal [name]   DOB:  Oct 05, 1975   MRN:  [id number]  PCP:  [name], MD    Chief Complaint: No chief complaint on file.   History of Present Illness:  Crystal [name] is a 37 y.o. very pleasant female patient who presents with the following:  Addysin is here today to discuss her recent dx of fibromyalgia.  However, on check in she was noted to have very low BP of 54/30  and was brought back urgently. Hiilei attests that she feels fine, and that her BP often gets very low right after she eats (she had just eaten a few chicken nuggets but had not had anything else all day).   States she just came in to discuss chronic issues today and does not feel bad.  Her BP remained approx 60/40 on recheck.  She ate crackers and drank gatorate.  I encouraged her to allow me to transfer her to the ED for further evaluation.   She stated that she feels ok and is adament that she does not want to go to the ER.  Agreed to allow her 10 more minute for her BP to come up.  She continued to take fluids.  BP did improve to 96/63.   She does have an UE DVT and is on coumadin currently.  Noted to be slightly polycythemic today.  Her most recent INR was therapeutic at 2.4.   Risa recently saw her oncology PA, Amy Berry.  Ms. Allyson Sabal asked her to follow-up with me.  Pavneet has complaint of pain all over.  She states the pain is so bad as to be debilitating.  She cannot do basic household chores due to her pain. So far her w/u has been negative ? suspect she may have FBM.  She started neurortin about one month ago but she has not noted much of a difference as of yet.  She was also given diazepam- they had planned to try changing her from xanax to diazepam for possible improvement in her muscle pain.  However, the 2mg  dose of dizaepmam once a day did not seem to help her much.  She would really rather stay  on her xanax.    She is going to see a rheumatologist, and has been referred to see a pain clinic as well.  She has not heard about these appts yet.    She is still taking carvedilol at 6.25 mg for palpitations.   Patient Active Problem List   Diagnosis Date Noted  . Chronic pain 01/09/2013  . Upper extremity weakness 01/09/2013  . DVT of upper extremity (deep vein thrombosis) 01/02/2013  . Arm edema 12/27/2012  . Neuropathic pain 12/27/2012  . Breast cancer, right 12/27/2012  . Chronic female pelvic pain 08/21/2012  . Cramps, muscle, general 08/20/2012  . Fatigue 08/20/2012  . Palpitations 01/18/2012  . Cough 11/07/2011  . Chemotherapy induced cardiomyopathy 09/08/2011  . Syncope 09/08/2011  . Diarrhea 09/08/2011  . Generalized convulsive seizure 07/13/2011  . Seizure disorder 07/13/2011  . Migraine 07/13/2011  . Anxiety disorder 07/13/2011  . Depression 07/13/2011  . Tachycardia - pulse 05/26/2011  . Hx MRSA infection 04/13/2011  . Anemia associated with chemotherapy 03/29/2011  . Cellulitis and abscess 03/27/2011  . Seizure 03/27/2011  . Breast cancer 11/25/2010  . NAUSEA AND VOMITING 01/28/2009  . ABDOMINAL PAIN, RIGHT LOWER QUADRANT 01/28/2009  Past Medical History  Diagnosis Date  . Status post chemotherapy     docetaxel/carboplatin/trastuzumab  . Anxiety   . Hx MRSA infection 03/28/11    Skin  . History of breast cancer DX JUNE 2012 W/ RIGHT BREAST INVASIVE DUCTAL CA  STAGE IIB---  S/P BILATERAL MASTECTOMIES AND RIGHT NODE DISSECTION    ONCOLOGIST-- DR [name]--  CHEMO ENDED DEC 2012 / RADIATION ENDED MAR 2013--  CURRENT ON TAMOXIFEN AND ZOLADEX  . Family history of anesthesia complication     MOTHER PONV  . Hot flashes due to tamoxifen   . History of idiopathic seizure     DX 2008 --  LAST ONE 2012--  NO ISSUES SINCE AND NO MEDS. -- PT STATES DOCTORS FELT IT WAS STRESS RELATED DUE TO HEALTH ISSUES  . Bipolar affective disorder, mixed   . GERD  (gastroesophageal reflux disease)     OCCASIONALLY TAKE ZANTAC  . Left ovarian cyst   . Right lower quadrant abdominal pain   . Frequency of urination   . Urgency of urination   . Strains to urinate   . Nocturia   . Heart palpitations   . Cancer   . Depression   . Seizures     Past Surgical History  Procedure Laterality Date  . Wisdom tooth extraction    . Biopsy breast  10/14/2010    right needle core biopsy  . Portacath placement  11/16/2010    placement of left subclavian port  . I&d extremity  09/26/2011    Procedure: IRRIGATION AND DEBRIDEMENT EXTREMITY;  Surgeon: [name] Ribas, MD;  Location: [facility];  Service: Orthopedics;  Laterality: Right;  I&D right hand cat bite wound  . Breast surgery  11/16/2010    bilateral mastectomy+ right axillary node dissection,T1cN1a, Her2+,ERPR+  (right breast invasive ductal carcinoma)  . Transthoracic echocardiogram  05-28-2012    LVF NORMAL/  EF 55-60%  . Cysto with hydrodistension N/A 07/23/2012    Procedure: CYSTOSCOPY/HYDRODISTENSION instillation of marcaine and pyridium;  Surgeon: Martina Sinner, MD;  Location: [facility];  Service: Urology;  Laterality: N/A;  INSTILLATION      History  Substance Use Topics  . Smoking status: Current Every Day Smoker -- 1.00 packs/day for 20 years    Types: Cigarettes  . Smokeless tobacco: Never Used  . Alcohol Use: Yes    Family History  Problem Relation Age of Onset  . Cancer Maternal Grandfather   . Mental illness Brother   . Mental illness Son     Allergies  Allergen Reactions  . Tramadol Other (See Comments)    Seizures  . Phenytoin Nausea And Vomiting  . Thorazine [Chlorpromazine Hcl] Hives  . Ciprofloxacin Nausea And Vomiting  . Vancomycin Rash    Possible rash reported by MD    Medication list has been reviewed and updated.  Current Outpatient Prescriptions on File Prior to Visit  Medication Sig Dispense Refill  . acetaminophen (TYLENOL) 500 MG tablet  Take 1,000 mg by mouth every 8 (eight) hours as needed for pain.      Marland Kitchen ALPRAZolam (XANAX) 1 MG tablet Take 1 three times a day for anxiety.  90 tablet  4  . carvedilol (COREG) 6.25 MG tablet Take 6.25 mg by mouth daily as needed. For heart palpitations (rapid heart beat)      . citalopram (CELEXA) 40 MG tablet Take 40 mg by mouth daily.      . diazepam (VALIUM) 2 MG tablet Take 1  tablet (2 mg total) by mouth daily as needed for anxiety (or muscle pain).  20 tablet  0  . gabapentin (NEURONTIN) 600 MG tablet Take 2 tablets (1,200 mg total) by mouth 3 (three) times daily.  180 tablet  1  . goserelin (ZOLADEX) 10.8 MG injection Inject 10.8 mg into the skin every 3 (three) months.      . ranitidine (ZANTAC) 150 MG tablet Take 150 mg by mouth 2 (two) times daily as needed for heartburn.      . warfarin (COUMADIN) 5 MG tablet Take 1.5 tablets (7.5 mg total) by mouth daily.  45 tablet  3   Current Facility-Administered Medications on File Prior to Visit  Medication Dose Route Frequency Provider Last Rate Last Dose  . bupivacaine (MARCAINE) 0.5 % 15 mL, phenazopyridine (PYRIDIUM) 400 mg bladder mixture   Bladder Instillation Once Martina Sinner, MD        Review of Systems:  As per HPI- otherwise negative.   Physical Examination: Filed Vitals:   01/22/13 1422  BP: 54/30  Pulse: 57  Temp: 97.8 F (36.6 C)   There were no vitals filed for this visit. There is no weight on file to calculate BMI. Ideal Body Weight:    GEN: WDWN, NAD, Non-toxic, A & O x 3.  At first presentation appeared pale- color improved to normal as her BP came up.   HEENT: Atraumatic, Normocephalic. Neck supple. No masses, No LAD. Ears and Nose: No external deformity. CV: RRR, No M/G/R. No JVD. No thrill. No extra heart sounds.  S/p bilateral mastectomy  PULM: CTA B, no wheezes, crackles, rhonchi. No retractions. No resp. distress. No accessory muscle use. ABD: S, NT, ND, +BS. No rebound. No HSM. EXTR: No  c/c/e NEURO Normal gait.  PSYCH: Normally interactive. Conversant. Not depressed or anxious appearing.  Calm demeanor.    Results for orders placed in visit on 01/22/13  GLUCOSE, POCT (MANUAL RESULT ENTRY)      Result Value Range   POC Glucose 154 (*) 70 - 99 mg/dl  POCT CBC      Result Value Range   WBC 9.4  4.6 - 10.2 K/uL   Lymph, poc 3.3  0.6 - 3.4   POC LYMPH PERCENT 35.6  10 - 50 %L   MID (cbc) 0.5  0 - 0.9   POC MID % 4.9  0 - 12 %M   POC Granulocyte 5.6  2 - 6.9   Granulocyte percent 59.5  37 - 80 %G   RBC 4.79  4.04 - 5.48 M/uL   Hemoglobin 16.5 (*) 12.2 - 16.2 g/dL   HCT, POC 16.1 (*) 09.6 - 47.9 %   MCV 101.7 (*) 80 - 97 fL   MCH, POC 34.4 (*) 27 - 31.2 pg   MCHC 33.9  31.8 - 35.4 g/dL   RDW, POC 04.5     Platelet Count, POC 234  142 - 424 K/uL   MPV 9.8  0 - 99.8 fL    Assessment and Plan: Hypotension, unspecified - Plan: POCT glucose (manual entry), POCT CBC  Chronic pain syndrome  Semaj is here today to discuss issues with chronic pain.  She was also noted to have significant hypotension.  She drank gatorade and ate a large snack including chips and fries while in clinic- her BP then improved to low normal.  She will hold her carvedilol for the next few days.  Offered and encouraged ED evaluation during the course of her  visit, but Kirrah is not willing to be transferred to the ED at this time.  She will keep a close watch on her condition and let me know if she has any problems.   She will see coumadin clinic tomorrow for an INR check.  Asked her to please mention her polycythemia and gave her a copy of today's CBC  Chronic pain which is difficult to treat due to several factors.  Braylie has a long history of neuro- psychiatric problems including possible seizures, depression, and chronic pain.  Her psychological challenges have been exacerbated by her dx of breast cancer at a very young age in 2012 and subsequent treatment and surgery. I suspect her pain is  partially due to depression and anxiety.  See excellent discussion per Zollie Scale, PA-C dated 01/16/13.  At this time I absolutely cannot prescribe any narcotics due to her low BP.  Explained to Elanore that narcotics are not a good long- term plan for fibromyalgia pain.  We may be able to consider giving her a small supply to use on her worst days, but I will need to see her back and recheck her BP first.  Would also like to consider changing her from celexa to savella, but cost is a big issue.  She will come and see me in 48 hours.    Signed [name], MD

## 2013-01-23 ENCOUNTER — Ambulatory Visit (HOSPITAL_BASED_OUTPATIENT_CLINIC_OR_DEPARTMENT_OTHER): Payer: Self-pay | Admitting: Pharmacist

## 2013-01-23 ENCOUNTER — Other Ambulatory Visit (HOSPITAL_BASED_OUTPATIENT_CLINIC_OR_DEPARTMENT_OTHER): Payer: Self-pay | Admitting: Lab

## 2013-01-23 ENCOUNTER — Other Ambulatory Visit: Payer: Self-pay | Admitting: Physician Assistant

## 2013-01-23 DIAGNOSIS — I82729 Chronic embolism and thrombosis of deep veins of unspecified upper extremity: Secondary | ICD-10-CM

## 2013-01-23 DIAGNOSIS — I82409 Acute embolism and thrombosis of unspecified deep veins of unspecified lower extremity: Secondary | ICD-10-CM

## 2013-01-23 DIAGNOSIS — C50911 Malignant neoplasm of unspecified site of right female breast: Secondary | ICD-10-CM

## 2013-01-23 DIAGNOSIS — R600 Localized edema: Secondary | ICD-10-CM

## 2013-01-23 DIAGNOSIS — M792 Neuralgia and neuritis, unspecified: Secondary | ICD-10-CM

## 2013-01-23 DIAGNOSIS — C50919 Malignant neoplasm of unspecified site of unspecified female breast: Secondary | ICD-10-CM

## 2013-01-23 LAB — CBC WITH DIFFERENTIAL/PLATELET
Basophils Absolute: 0 10*3/uL (ref 0.0–0.1)
EOS%: 2.2 % (ref 0.0–7.0)
Eosinophils Absolute: 0.4 10*3/uL (ref 0.0–0.5)
HCT: 42 % (ref 34.8–46.6)
HGB: 15 g/dL (ref 11.6–15.9)
MCH: 33.6 pg (ref 25.1–34.0)
NEUT#: 12.4 10*3/uL — ABNORMAL HIGH (ref 1.5–6.5)
NEUT%: 79.1 % — ABNORMAL HIGH (ref 38.4–76.8)
RDW: 12.6 % (ref 11.2–14.5)
lymph#: 2.5 10*3/uL (ref 0.9–3.3)

## 2013-01-23 LAB — PROTHROMBIN TIME: INR: 6.57 (ref <1.50)

## 2013-01-23 LAB — POCT INR: INR: 6.57

## 2013-01-23 LAB — PROTIME-INR

## 2013-01-23 NOTE — Progress Notes (Signed)
INR = 6.57 (sent out; venopuncture) BP = 118/92; HR = 111; Temp 98.6  Pt was at PCP yesterday w/ Hypotension & found to be polycythemic (Hgb = 16.5; Hct = 48.7) Repeat CBC today shows Hgb = 15; Hct = 42; WBC = 15.6 Pt states she drinks "tons" of water at home along w/ caffeine.  Today she has an open "Red Bull" in her purse. Her Coreg is on hold. She has developed runny nose/sinus drainage & pressure in the past 2-3 days. Pt reports having 1 beer & 1/2 glass of wine last night.   No bleeding/bruising.  I notice a quarter-sized burn/blister on her L wrist today. INR supratherapeutic.  Likely due to EtOH consumption. I reminded Doree that it is not a good idea to drink EtOH while on Coumadin.  She says it helps ease her pain & that is the only thing she has now that helps.  She is awaiting appt at pain clinic.  She sees her PCP tomorrow.  Hopefully soon she will have a pain med that she can use instead of EtOH. I have instructed her to hold Coumadin for today & tomorrow. Repeat protime on Friday.  If still elevated, we will need to try to notify Dr. [name] who will be here that morning.  Zollie Scale, PA aware & agrees w/ plan. Ebony Hail, Pharm.D., CPP 01/23/2013@12 :50 PM

## 2013-01-24 ENCOUNTER — Ambulatory Visit (INDEPENDENT_AMBULATORY_CARE_PROVIDER_SITE_OTHER): Payer: Self-pay | Admitting: Family Medicine

## 2013-01-24 VITALS — BP 122/86 | HR 88 | Temp 98.1°F | Resp 18 | Ht 65.0 in | Wt 145.0 lb

## 2013-01-24 DIAGNOSIS — G894 Chronic pain syndrome: Secondary | ICD-10-CM

## 2013-01-24 MED ORDER — HYDROCODONE-ACETAMINOPHEN 5-325 MG PO TABS: ORAL_TABLET | ORAL | Status: DC

## 2013-01-24 NOTE — Patient Instructions (Addendum)
You may cautiously try taking hydrocodone, only when your pain is most severe.  Please call the pain clinic and work on getting an appt as soon as possible.    Remember that your xanax and neurontin can increase sedation when used in combination with the hydrocodone.  I would recommend that you start with a 1/2 vicodin tablet at first and see how it effects you.    It is important to limit your total tylenol/ acetaminophen dosage to 2,000 mg a day

## 2013-01-24 NOTE — Progress Notes (Signed)
[facility] [address], [place] [address] [phone]  Date:  01/24/2013   Name:  Crystal [name]   DOB:  1976/02/06   MRN:  [id number]  PCP:  [name], MD    Chief Complaint: Follow-up   History of Present Illness:  Crystal [name] is a 37 y.o. very pleasant female patient who presents with the following:  Here today for a recheck.  She was here 2 days ago and was noted to have hypotension.  She came in seeking help for her chronic pain.  She has been taking neurontin with only partial relief.  States that her pain is a problem in her day to day activities and prevents her from doing needed chores.  "I just want to be able to do the dishes."  She has been referred to a pain clinic and also to rheumatology.  These appts are still pending.  She would really like to have some pain medication to use as needed.  She understands the risks of sedation and dependence.  However, she feels that she cannot continue to function as she is currently.   She was noted to have a high INR yesterday- she is holding her coumadin and having a recheck tomorrow.    Patient Active Problem List   Diagnosis Date Noted  . Chronic pain 01/09/2013  . Upper extremity weakness 01/09/2013  . DVT of upper extremity (deep vein thrombosis) 01/02/2013  . Arm edema 12/27/2012  . Neuropathic pain 12/27/2012  . Breast cancer, right 12/27/2012  . Chronic female pelvic pain 08/21/2012  . Cramps, muscle, general 08/20/2012  . Fatigue 08/20/2012  . Palpitations 01/18/2012  . Cough 11/07/2011  . Chemotherapy induced cardiomyopathy 09/08/2011  . Syncope 09/08/2011  . Diarrhea 09/08/2011  . Generalized convulsive seizure 07/13/2011  . Seizure disorder 07/13/2011  . Migraine 07/13/2011  . Anxiety disorder 07/13/2011  . Depression 07/13/2011  . Tachycardia - pulse 05/26/2011  . Hx MRSA infection 04/13/2011  . Anemia associated with chemotherapy 03/29/2011  . Cellulitis and abscess  03/27/2011  . Seizure 03/27/2011  . Breast cancer 11/25/2010  . NAUSEA AND VOMITING 01/28/2009  . ABDOMINAL PAIN, RIGHT LOWER QUADRANT 01/28/2009    Past Medical History  Diagnosis Date  . Status post chemotherapy     docetaxel/carboplatin/trastuzumab  . Anxiety   . Hx MRSA infection 03/28/11    Skin  . History of breast cancer DX JUNE 2012 W/ RIGHT BREAST INVASIVE DUCTAL CA  STAGE IIB---  S/P BILATERAL MASTECTOMIES AND RIGHT NODE DISSECTION    ONCOLOGIST-- DR [name]--  CHEMO ENDED DEC 2012 / RADIATION ENDED MAR 2013--  CURRENT ON TAMOXIFEN AND ZOLADEX  . Family history of anesthesia complication     MOTHER PONV  . Hot flashes due to tamoxifen   . History of idiopathic seizure     DX 2008 --  LAST ONE 2012--  NO ISSUES SINCE AND NO MEDS. -- PT STATES DOCTORS FELT IT WAS STRESS RELATED DUE TO HEALTH ISSUES  . Bipolar affective disorder, mixed   . GERD (gastroesophageal reflux disease)     OCCASIONALLY TAKE ZANTAC  . Left ovarian cyst   . Right lower quadrant abdominal pain   . Frequency of urination   . Urgency of urination   . Strains to urinate   . Nocturia   . Heart palpitations   . Cancer   . Depression   . Seizures     Past Surgical History  Procedure Laterality Date  .  Wisdom tooth extraction    . Biopsy breast  10/14/2010    right needle core biopsy  . Portacath placement  11/16/2010    placement of left subclavian port  . I&d extremity  09/26/2011    Procedure: IRRIGATION AND DEBRIDEMENT EXTREMITY;  Surgeon: [name] Ribas, MD;  Location: [facility];  Service: Orthopedics;  Laterality: Right;  I&D right hand cat bite wound  . Breast surgery  11/16/2010    bilateral mastectomy+ right axillary node dissection,T1cN1a, Her2+,ERPR+  (right breast invasive ductal carcinoma)  . Transthoracic echocardiogram  05-28-2012    LVF NORMAL/  EF 55-60%  . Cysto with hydrodistension N/A 07/23/2012    Procedure: CYSTOSCOPY/HYDRODISTENSION instillation of marcaine and pyridium;   Surgeon: Martina Sinner, MD;  Location: [facility] [place];  Service: Urology;  Laterality: N/A;  INSTILLATION      History  Substance Use Topics  . Smoking status: Current Every Day Smoker -- 1.00 packs/day for 20 years    Types: Cigarettes  . Smokeless tobacco: Never Used  . Alcohol Use: Yes    Family History  Problem Relation Age of Onset  . Cancer Maternal Grandfather   . Mental illness Brother   . Mental illness Son     Allergies  Allergen Reactions  . Tramadol Other (See Comments)    Seizures  . Phenytoin Nausea And Vomiting  . Thorazine [Chlorpromazine Hcl] Hives  . Ciprofloxacin Nausea And Vomiting  . Vancomycin Rash    Possible rash reported by MD    Medication list has been reviewed and updated.  Current Outpatient Prescriptions on File Prior to Visit  Medication Sig Dispense Refill  . ALPRAZolam (XANAX) 1 MG tablet Take 1 three times a day for anxiety.  90 tablet  4  . citalopram (CELEXA) 40 MG tablet Take 40 mg by mouth daily.      Marland Kitchen gabapentin (NEURONTIN) 600 MG tablet Take 2 tablets (1,200 mg total) by mouth 3 (three) times daily.  180 tablet  1  . goserelin (ZOLADEX) 10.8 MG injection Inject 10.8 mg into the skin every 3 (three) months.      . warfarin (COUMADIN) 5 MG tablet Take 1.5 tablets (7.5 mg total) by mouth daily.  45 tablet  3  . acetaminophen (TYLENOL) 500 MG tablet Take 1,000 mg by mouth every 8 (eight) hours as needed for pain.      . carvedilol (COREG) 6.25 MG tablet Take 6.25 mg by mouth daily as needed. For heart palpitations (rapid heart beat)      . diazepam (VALIUM) 2 MG tablet Take 1 tablet (2 mg total) by mouth daily as needed for anxiety (or muscle pain).  20 tablet  0  . ranitidine (ZANTAC) 150 MG tablet Take 150 mg by mouth 2 (two) times daily as needed for heartburn.       Current Facility-Administered Medications on File Prior to Visit  Medication Dose Route Frequency Provider Last Rate Last Dose  . bupivacaine  (MARCAINE) 0.5 % 15 mL, phenazopyridine (PYRIDIUM) 400 mg bladder mixture   Bladder Instillation Once Martina Sinner, MD        Review of Systems:  As per HPI- otherwise negative.   Physical Examination: Filed Vitals:   01/24/13 0942  BP: 98/66  Pulse: 88  Temp: 98.1 F (36.7 C)  Resp: 18   Filed Vitals:   01/24/13 0942  Height: 5\' 5"  (1.651 m)  Weight: 145 lb (65.772 kg)   Body mass index is 24.13 kg/(m^2).  Ideal Body Weight: Weight in (lb) to have BMI = 25: 149.9  GEN: WDWN, NAD, Non-toxic, A & O x 3, looks well, cheerful HEENT: Atraumatic, Normocephalic. Neck supple. No masses, No LAD. Ears and Nose: No external deformity. CV: RRR, No M/G/R. No JVD. No thrill. No extra heart sounds. PULM: CTA B, no wheezes, crackles, rhonchi. No retractions. No resp. distress. No accessory muscle use. EXTR: No c/c/e NEURO Normal gait.  PSYCH: Normally interactive. Conversant. Not depressed or anxious appearing.  Calm demeanor.    Assessment and Plan: Chronic pain syndrome - Plan: HYDROcodone-acetaminophen (NORCO/VICODIN) 5-325 MG per tablet  Chronic, episodic, migratory pain.  Work up with rheumatology is pending.   Will give her a small supply of hydrocodone, but advised her that this medication is not a good long term solution, especially if she is diagnosed with fibromyalgia.   Also advised her to avoid combining narcotics and her xanax together, and that neurontin can amplify the sedating effects of opioids.   Cautioned to limit acetaminophen to less than 2gm per day Signed [name], MD

## 2013-01-25 ENCOUNTER — Ambulatory Visit (HOSPITAL_BASED_OUTPATIENT_CLINIC_OR_DEPARTMENT_OTHER): Payer: Self-pay | Admitting: Pharmacist

## 2013-01-25 ENCOUNTER — Telehealth: Payer: Self-pay | Admitting: Oncology

## 2013-01-25 ENCOUNTER — Other Ambulatory Visit (HOSPITAL_BASED_OUTPATIENT_CLINIC_OR_DEPARTMENT_OTHER): Payer: Self-pay | Admitting: Lab

## 2013-01-25 DIAGNOSIS — I82409 Acute embolism and thrombosis of unspecified deep veins of unspecified lower extremity: Secondary | ICD-10-CM

## 2013-01-25 DIAGNOSIS — R600 Localized edema: Secondary | ICD-10-CM

## 2013-01-25 DIAGNOSIS — M792 Neuralgia and neuritis, unspecified: Secondary | ICD-10-CM

## 2013-01-25 DIAGNOSIS — C50911 Malignant neoplasm of unspecified site of right female breast: Secondary | ICD-10-CM

## 2013-01-25 DIAGNOSIS — I82729 Chronic embolism and thrombosis of deep veins of unspecified upper extremity: Secondary | ICD-10-CM

## 2013-01-25 LAB — PROTIME-INR
INR: 4.1 — ABNORMAL HIGH (ref 2.00–3.50)
Protime: 49.2 Seconds — ABNORMAL HIGH (ref 10.6–13.4)

## 2013-01-25 LAB — POCT INR: INR: 4.1

## 2013-01-25 NOTE — Patient Instructions (Addendum)
INR remains above goal Do not take Coumadin over the weekend Recheck INR on Monday 01/28/13 at 9am for lab and 9:15am for coumadin clinic.

## 2013-01-25 NOTE — Telephone Encounter (Signed)
, °

## 2013-01-25 NOTE — Progress Notes (Signed)
INR remains above goal today after holding 2 doses. Patient states she is not doing well. She has not been able to sleep and is still in pain. She reports drinking about 1-2 alcoholic beverages per night. Ginna discussed alcohol use while on coumadin with patient last visit and we readdressed this today. Pt feels that it is the only thing that is currently helping her as her pain medications have not brought her relief. She states she hopes to feel better so she can stop drinking alcohol. She was seen yesterday by PCP and was given a small supply of hydrocodone. The patient requests to hold her coumadin over the weekend as she is not comfortable restarting her coumadin until her level is checked again. I advised her that her INR will continue to decrease over the next few days and she could restart her coumadin dose on Sunday evening. Ms. Mclin still was not comfortable with this plan and would like to hold her coumadin until Monday. Appointments made for lab and coumadin 01/28/13 at 9am for lab and 9:15am for coumadin clinic.   I discussed this plan with Dr. [name] and he is in agreement with holding coumadin over the weekend and rechecking her on Monday. Per Dr. [name] patient would NOT need lovenox bridging if she is SUBtherapeutic when her INR is checked on Monday

## 2013-01-28 ENCOUNTER — Other Ambulatory Visit (HOSPITAL_BASED_OUTPATIENT_CLINIC_OR_DEPARTMENT_OTHER): Payer: Self-pay | Admitting: Lab

## 2013-01-28 ENCOUNTER — Ambulatory Visit (HOSPITAL_BASED_OUTPATIENT_CLINIC_OR_DEPARTMENT_OTHER): Payer: Self-pay | Admitting: Pharmacist

## 2013-01-28 DIAGNOSIS — M792 Neuralgia and neuritis, unspecified: Secondary | ICD-10-CM

## 2013-01-28 DIAGNOSIS — I82409 Acute embolism and thrombosis of unspecified deep veins of unspecified lower extremity: Secondary | ICD-10-CM

## 2013-01-28 DIAGNOSIS — R600 Localized edema: Secondary | ICD-10-CM

## 2013-01-28 DIAGNOSIS — C50911 Malignant neoplasm of unspecified site of right female breast: Secondary | ICD-10-CM

## 2013-01-28 LAB — PROTIME-INR

## 2013-01-28 NOTE — Progress Notes (Signed)
INR low which is to be expected since Coumadin has been held for 5 days.  No changes in medications.  No bleeding or bruising or s/s of clot.  Will resume coumadin at 5mg  daily and check PT/INR in 1 week.  Per previous coumadin clinic note on 01/25/13, will not resume Lovenox per Dr [name].  Check PT/INR in 1 week.

## 2013-01-29 ENCOUNTER — Telehealth: Payer: Self-pay | Admitting: *Deleted

## 2013-01-29 NOTE — Telephone Encounter (Signed)
Message left by pt inquiring if she should and if yes - could get the FLU vaccine with next Coumadin Clinic appt ( next week ).  Return call for pt is [phone].

## 2013-01-30 ENCOUNTER — Other Ambulatory Visit: Payer: Self-pay | Admitting: *Deleted

## 2013-01-30 DIAGNOSIS — C50919 Malignant neoplasm of unspecified site of unspecified female breast: Secondary | ICD-10-CM

## 2013-01-30 MED ORDER — INFLUENZA VAC SPLIT QUAD 0.5 ML IM SUSP
0.5000 mL | Freq: Once | INTRAMUSCULAR | Status: DC
  Filled 2013-01-30: qty 0.5

## 2013-01-31 ENCOUNTER — Telehealth: Payer: Self-pay | Admitting: *Deleted

## 2013-01-31 ENCOUNTER — Encounter: Payer: Self-pay | Admitting: Oncology

## 2013-01-31 NOTE — Progress Notes (Signed)
Need to see status of insurance inquires. We can't just use the letter of support. In order to give application for assistance, will need some proof of what hubby makes as self employed.

## 2013-01-31 NOTE — Telephone Encounter (Signed)
sw pt gv appt for inj on 02/04/13 @ 10:15am. pt is aware.Marland Kitchentd

## 2013-02-04 ENCOUNTER — Ambulatory Visit (HOSPITAL_BASED_OUTPATIENT_CLINIC_OR_DEPARTMENT_OTHER): Payer: Self-pay | Admitting: Pharmacist

## 2013-02-04 ENCOUNTER — Ambulatory Visit: Payer: Self-pay

## 2013-02-04 ENCOUNTER — Encounter: Payer: Self-pay | Admitting: Oncology

## 2013-02-04 ENCOUNTER — Other Ambulatory Visit (HOSPITAL_BASED_OUTPATIENT_CLINIC_OR_DEPARTMENT_OTHER): Payer: Self-pay

## 2013-02-04 ENCOUNTER — Ambulatory Visit (HOSPITAL_BASED_OUTPATIENT_CLINIC_OR_DEPARTMENT_OTHER): Payer: Self-pay

## 2013-02-04 ENCOUNTER — Other Ambulatory Visit: Payer: Self-pay | Admitting: *Deleted

## 2013-02-04 VITALS — BP 126/91 | HR 78 | Temp 98.8°F

## 2013-02-04 DIAGNOSIS — I82409 Acute embolism and thrombosis of unspecified deep veins of unspecified lower extremity: Secondary | ICD-10-CM

## 2013-02-04 DIAGNOSIS — C50919 Malignant neoplasm of unspecified site of unspecified female breast: Secondary | ICD-10-CM

## 2013-02-04 DIAGNOSIS — C50911 Malignant neoplasm of unspecified site of right female breast: Secondary | ICD-10-CM

## 2013-02-04 DIAGNOSIS — M792 Neuralgia and neuritis, unspecified: Secondary | ICD-10-CM

## 2013-02-04 DIAGNOSIS — Z5112 Encounter for antineoplastic immunotherapy: Secondary | ICD-10-CM

## 2013-02-04 DIAGNOSIS — R600 Localized edema: Secondary | ICD-10-CM

## 2013-02-04 DIAGNOSIS — Z23 Encounter for immunization: Secondary | ICD-10-CM

## 2013-02-04 DIAGNOSIS — I82729 Chronic embolism and thrombosis of deep veins of unspecified upper extremity: Secondary | ICD-10-CM

## 2013-02-04 LAB — PROTIME-INR: INR: 1.8 — ABNORMAL LOW (ref 2.00–3.50)

## 2013-02-04 LAB — POCT INR: INR: 1.8

## 2013-02-04 MED ORDER — GOSERELIN ACETATE 10.8 MG ~~LOC~~ IMPL
10.8000 mg | DRUG_IMPLANT | SUBCUTANEOUS | Status: DC
  Administered 2013-02-04: 10.8 mg via SUBCUTANEOUS
  Filled 2013-02-04: qty 10.8

## 2013-02-04 MED ORDER — INFLUENZA VAC SPLIT QUAD 0.5 ML IM SUSP
0.5000 mL | Freq: Once | INTRAMUSCULAR | Status: AC
  Administered 2013-02-04: 0.5 mL via INTRAMUSCULAR
  Filled 2013-02-04: qty 0.5

## 2013-02-04 NOTE — Progress Notes (Signed)
Patient said she has not checked into insurance. I advised her that I need proof of her hubby's income in order to get asst with meds. She will see and call me back.

## 2013-02-04 NOTE — Progress Notes (Signed)
INR = 1.8 on Coumadin 5 mg daily Pt is here today w/ her friend [place]. No missed doses. EtOH intake is about the same per pt. Meds remain the same.  She asked me to take hydrocodone off of her med list since it was not helping her pain sxs. She is scheduled to see Dr. Eleonore Chiquito on 10/13 for her fibromyalgia. She will get a flu shot here today. INR just below goal.  I will leave her dose at 5 mg/day for now & recheck her in 2 weeks.  I'm afraid to increase her dose right now since she was supratherapeutic recently (likely due to EtOH intake). Ebony Hail, Pharm.D., CPP 02/04/2013@10 :10 AM

## 2013-02-18 ENCOUNTER — Telehealth: Payer: Self-pay | Admitting: *Deleted

## 2013-02-18 ENCOUNTER — Other Ambulatory Visit (HOSPITAL_BASED_OUTPATIENT_CLINIC_OR_DEPARTMENT_OTHER): Payer: Self-pay | Admitting: Lab

## 2013-02-18 ENCOUNTER — Ambulatory Visit (HOSPITAL_BASED_OUTPATIENT_CLINIC_OR_DEPARTMENT_OTHER): Payer: Self-pay | Admitting: Pharmacist

## 2013-02-18 ENCOUNTER — Other Ambulatory Visit: Payer: Self-pay | Admitting: *Deleted

## 2013-02-18 ENCOUNTER — Encounter (INDEPENDENT_AMBULATORY_CARE_PROVIDER_SITE_OTHER): Payer: Self-pay

## 2013-02-18 DIAGNOSIS — I82729 Chronic embolism and thrombosis of deep veins of unspecified upper extremity: Secondary | ICD-10-CM

## 2013-02-18 DIAGNOSIS — I82409 Acute embolism and thrombosis of unspecified deep veins of unspecified lower extremity: Secondary | ICD-10-CM

## 2013-02-18 DIAGNOSIS — R600 Localized edema: Secondary | ICD-10-CM

## 2013-02-18 DIAGNOSIS — C50911 Malignant neoplasm of unspecified site of right female breast: Secondary | ICD-10-CM

## 2013-02-18 DIAGNOSIS — Z7901 Long term (current) use of anticoagulants: Secondary | ICD-10-CM

## 2013-02-18 DIAGNOSIS — M792 Neuralgia and neuritis, unspecified: Secondary | ICD-10-CM

## 2013-02-18 LAB — PROTIME-INR
INR: 1.2 — ABNORMAL LOW (ref 2.00–3.50)
Protime: 14.4 Seconds — ABNORMAL HIGH (ref 10.6–13.4)

## 2013-02-18 MED ORDER — GABAPENTIN 300 MG PO CAPS: 300.0000 mg | ORAL_CAPSULE | Freq: Every day | ORAL | Status: DC

## 2013-02-18 NOTE — Telephone Encounter (Signed)
Patient called requesting medication to help with hot flashes, unable to sleep.

## 2013-02-18 NOTE — Progress Notes (Signed)
INR remains below goal today but overall patient is doing well. She reports no fibromyalgia pain stating that she has only been needing tylenol on an as needed basis.  She reports wanting to stop drinking alcohol and has begun decreasing her intake (she had a small amount of alcohol Saturday night). She also has been trying to eat "healthier" with more salads and fruits/vegetables. This could also be impacting her low INR.  Ms. Liggins appears much healthier, happier and has more energy than previously (she is glad to have pain relieve at this time) If her lifestyle changes at this time remain consistent it is likely we will need to increase her coumadin dose. I will do this today but slowly due to the potential for alcohol use. Plan to increase dose to 7.5 mg on Mondays, Wednesdays, and Fridays and 5 mg the other days of the week. Pt will return in 10-14 days on 03/04/13 for lab and 11 am and coumadin clinic at 11:15 am.

## 2013-02-18 NOTE — Patient Instructions (Signed)
INR below goal Increase dose to Coumadin 5mg  daily and 7.5 mg on Monday, Wednesday, Friday. Recheck INR on Monday 03/04/13 at 11 am for lab and 11:15 am for coumadin clinic.

## 2013-03-04 ENCOUNTER — Ambulatory Visit: Payer: Self-pay

## 2013-03-04 ENCOUNTER — Other Ambulatory Visit: Payer: Self-pay | Admitting: Lab

## 2013-03-06 ENCOUNTER — Emergency Department (HOSPITAL_COMMUNITY)
Admission: EM | Admit: 2013-03-06 | Discharge: 2013-03-06 | Disposition: A | Discharge status: Home / Self Care | Payer: Self-pay | Attending: Emergency Medicine | Admitting: Emergency Medicine

## 2013-03-06 ENCOUNTER — Encounter (HOSPITAL_COMMUNITY): Payer: Self-pay | Admitting: Emergency Medicine

## 2013-03-06 DIAGNOSIS — F319 Bipolar disorder, unspecified: Secondary | ICD-10-CM | POA: Insufficient documentation

## 2013-03-06 DIAGNOSIS — Z8742 Personal history of other diseases of the female genital tract: Secondary | ICD-10-CM | POA: Insufficient documentation

## 2013-03-06 DIAGNOSIS — G40909 Epilepsy, unspecified, not intractable, without status epilepticus: Secondary | ICD-10-CM | POA: Insufficient documentation

## 2013-03-06 DIAGNOSIS — Z9221 Personal history of antineoplastic chemotherapy: Secondary | ICD-10-CM | POA: Insufficient documentation

## 2013-03-06 DIAGNOSIS — F1010 Alcohol abuse, uncomplicated: Secondary | ICD-10-CM | POA: Insufficient documentation

## 2013-03-06 DIAGNOSIS — Z8614 Personal history of Methicillin resistant Staphylococcus aureus infection: Secondary | ICD-10-CM | POA: Insufficient documentation

## 2013-03-06 DIAGNOSIS — Z853 Personal history of malignant neoplasm of breast: Secondary | ICD-10-CM | POA: Insufficient documentation

## 2013-03-06 DIAGNOSIS — F17200 Nicotine dependence, unspecified, uncomplicated: Secondary | ICD-10-CM | POA: Insufficient documentation

## 2013-03-06 DIAGNOSIS — K219 Gastro-esophageal reflux disease without esophagitis: Secondary | ICD-10-CM | POA: Insufficient documentation

## 2013-03-06 DIAGNOSIS — Z79899 Other long term (current) drug therapy: Secondary | ICD-10-CM | POA: Insufficient documentation

## 2013-03-06 DIAGNOSIS — F411 Generalized anxiety disorder: Secondary | ICD-10-CM | POA: Insufficient documentation

## 2013-03-06 DIAGNOSIS — Z7901 Long term (current) use of anticoagulants: Secondary | ICD-10-CM | POA: Insufficient documentation

## 2013-03-06 MED ORDER — ZIPRASIDONE HCL 20 MG PO CAPS: 20.0000 mg | ORAL_CAPSULE | Freq: Once | ORAL | Status: DC

## 2013-03-06 NOTE — ED Notes (Signed)
Pt BIB EMS. Pt was at her residence and called EMS because she states she wants to hurt other people. Pt has strong odor of ETOH on self. Pt ambulatory to exam room with independent gait. Pt is calm, cooperative. Pt denies SI per EMS.

## 2013-03-06 NOTE — ED Provider Notes (Signed)
CSN: [id number]     Arrival date & time 03/06/13  2223 History   First MD Initiated Contact with Patient 03/06/13 2233     Chief Complaint  Patient presents with  . Detox    (Consider location/radiation/quality/duration/timing/severity/associated sxs/prior Treatment) HPI Comments: Patient presents to the ER stating that she would like help with alcoholism. She reports that she has been drinking heavily recently. She was brought to the ER by ambulance after causing a disturbance at her residence. At arrival to the ER, patient does appear be intoxicated. She denies homicidality and suicidality. She is adamant that she does not want inpatient detox or rehabilitation. She also is adamant that she does not want most from behavioral health involved in her care. She says that she would like to do her rehabilitation at home, is asking for a pill to help her with her craving for alcohol. Patient also states that she has a history of bipolar disorder, but currently does not take any medicines because they made her "a zombie".   Past Medical History  Diagnosis Date  . Status post chemotherapy     docetaxel/carboplatin/trastuzumab  . Anxiety   . Hx MRSA infection 03/28/11    Skin  . History of breast cancer DX JUNE 2012 W/ RIGHT BREAST INVASIVE DUCTAL CA  STAGE IIB---  S/P BILATERAL MASTECTOMIES AND RIGHT NODE DISSECTION    ONCOLOGIST-- DR [name]--  CHEMO ENDED DEC 2012 / RADIATION ENDED MAR 2013--  CURRENT ON TAMOXIFEN AND ZOLADEX  . Family history of anesthesia complication     MOTHER PONV  . Hot flashes due to tamoxifen   . History of idiopathic seizure     DX 2008 --  LAST ONE 2012--  NO ISSUES SINCE AND NO MEDS. -- PT STATES DOCTORS FELT IT WAS STRESS RELATED DUE TO HEALTH ISSUES  . Bipolar affective disorder, mixed   . GERD (gastroesophageal reflux disease)     OCCASIONALLY TAKE ZANTAC  . Left ovarian cyst   . Right lower quadrant abdominal pain   . Frequency of urination   . Urgency of  urination   . Strains to urinate   . Nocturia   . Heart palpitations   . Cancer   . Depression   . Seizures    Past Surgical History  Procedure Laterality Date  . Wisdom tooth extraction    . Biopsy breast  10/14/2010    right needle core biopsy  . Portacath placement  11/16/2010    placement of left subclavian port  . I&d extremity  09/26/2011    Procedure: IRRIGATION AND DEBRIDEMENT EXTREMITY;  Surgeon: [name] Ribas, MD;  Location: [facility];  Service: Orthopedics;  Laterality: Right;  I&D right hand cat bite wound  . Breast surgery  11/16/2010    bilateral mastectomy+ right axillary node dissection,T1cN1a, Her2+,ERPR+  (right breast invasive ductal carcinoma)  . Transthoracic echocardiogram  05-28-2012    LVF NORMAL/  EF 55-60%  . Cysto with hydrodistension N/A 07/23/2012    Procedure: CYSTOSCOPY/HYDRODISTENSION instillation of marcaine and pyridium;  Surgeon: Martina Sinner, MD;  Location: [facility] [place];  Service: Urology;  Laterality: N/A;  INSTILLATION     Family History  Problem Relation Age of Onset  . Cancer Maternal Grandfather   . Mental illness Brother   . Mental illness Son    History  Substance Use Topics  . Smoking status: Current Every Day Smoker -- 1.00 packs/day for 20 years    Types: Cigarettes  . Smokeless  tobacco: Never Used  . Alcohol Use: Yes   OB History   Grav Para Term Preterm Abortions TAB SAB Ect Mult Living   1 1 1  0 0 0 0 0 0 1     Review of Systems  Psychiatric/Behavioral: Positive for dysphoric mood. Negative for suicidal ideas, hallucinations and self-injury. The patient is nervous/anxious.   All other systems reviewed and are negative.    Allergies  Tramadol; Phenytoin; Thorazine; Ciprofloxacin; and Vancomycin  Home Medications   Current Outpatient Rx  Name  Route  Sig  Dispense  Refill  . acetaminophen (TYLENOL) 500 MG tablet   Oral   Take 1,000 mg by mouth every 8 (eight) hours as needed for pain.         Marland Kitchen  ALPRAZolam (XANAX) 1 MG tablet      Take 1 three times a day for anxiety.   90 tablet   4   . carvedilol (COREG) 6.25 MG tablet   Oral   Take 6.25 mg by mouth daily as needed. For heart palpitations (rapid heart beat)         . citalopram (CELEXA) 40 MG tablet   Oral   Take 40 mg by mouth daily.         Marland Kitchen gabapentin (NEURONTIN) 300 MG capsule   Oral   Take 1 capsule (300 mg total) by mouth at bedtime.   30 capsule   1   . goserelin (ZOLADEX) 10.8 MG injection   Subcutaneous   Inject 10.8 mg into the skin every 3 (three) months.         . ranitidine (ZANTAC) 150 MG tablet   Oral   Take 150 mg by mouth 2 (two) times daily as needed for heartburn.         . warfarin (COUMADIN) 5 MG tablet   Oral   Take 1.5 tablets (7.5 mg total) by mouth daily.   45 tablet   3     Changed Brand from TEVA(unavailable) to TARO with  ...    BP 129/93  Pulse 86  Temp(Src) 97.7 F (36.5 C) (Oral)  Resp 16  SpO2 100% Physical Exam  Constitutional: She is oriented to person, place, and time. She appears well-developed and well-nourished. No distress.  HENT:  Head: Normocephalic and atraumatic.  Right Ear: Hearing normal.  Left Ear: Hearing normal.  Nose: Nose normal.  Mouth/Throat: Oropharynx is clear and moist and mucous membranes are normal.  Eyes: Conjunctivae and EOM are normal. Pupils are equal, round, and reactive to light.  Neck: Normal range of motion. Neck supple.  Cardiovascular: Regular rhythm, S1 normal and S2 normal.  Exam reveals no gallop and no friction rub.   No murmur heard. Pulmonary/Chest: Effort normal and breath sounds normal. No respiratory distress. She exhibits no tenderness.  Abdominal: Soft. Normal appearance and bowel sounds are normal. There is no hepatosplenomegaly. There is no tenderness. There is no rebound, no guarding, no tenderness at McBurney's point and negative Murphy's sign. No hernia.  Musculoskeletal: Normal range of motion.   Neurological: She is alert and oriented to person, place, and time. She has normal strength. No cranial nerve deficit or sensory deficit. Coordination normal. GCS eye subscore is 4. GCS verbal subscore is 5. GCS motor subscore is 6.  Slightly slurred speech due to intoxication, but appropriate behavior and no focal defects  Skin: Skin is warm, dry and intact. No rash noted. No cyanosis.  Psychiatric: She has a normal mood and affect.  Her speech is normal and behavior is normal. Thought content normal.    ED Course  Procedures (including critical care time) Labs Review Labs Reviewed - No data to display Imaging Review No results found.  EKG Interpretation   None       MDM  Diagnosis: 1. Alcohol intoxication and dependence 2. Bipolar disorder  Patient presents to the ER by ambulance. She is intoxicated and admits to being an alcoholic. She does want help with this, but has multiple stipulations. She does not want inpatient treatment, she does not want behavioral health involved in her care. She wants to be treated at home, and thinks there might be a pill I could prescribe that would eliminate her cravings. I informed her that there was no such medications and that she needs to undergo rehabilitation. She does decline inpatient treatment once again. She admits to a history of untreated bipolar disorder, is not currently homicidal or suicidal. She declines treatment for this as well. Patient asking for something to sedate her to help her sleep tonight and resources for alcohol abuse.    Gilda Crease, MD 03/06/13 [phone]

## 2013-03-08 ENCOUNTER — Encounter: Payer: Self-pay | Admitting: Oncology

## 2013-03-08 NOTE — Progress Notes (Signed)
Still no income for husband has been received. See prev notes.

## 2013-03-10 ENCOUNTER — Other Ambulatory Visit: Payer: Self-pay | Admitting: Physician Assistant

## 2013-03-11 ENCOUNTER — Ambulatory Visit (HOSPITAL_BASED_OUTPATIENT_CLINIC_OR_DEPARTMENT_OTHER): Payer: Self-pay | Admitting: Pharmacist

## 2013-03-11 ENCOUNTER — Other Ambulatory Visit (HOSPITAL_BASED_OUTPATIENT_CLINIC_OR_DEPARTMENT_OTHER): Payer: Self-pay | Admitting: Lab

## 2013-03-11 ENCOUNTER — Other Ambulatory Visit: Payer: Self-pay | Admitting: *Deleted

## 2013-03-11 DIAGNOSIS — M792 Neuralgia and neuritis, unspecified: Secondary | ICD-10-CM

## 2013-03-11 DIAGNOSIS — C50911 Malignant neoplasm of unspecified site of right female breast: Secondary | ICD-10-CM

## 2013-03-11 DIAGNOSIS — I82729 Chronic embolism and thrombosis of deep veins of unspecified upper extremity: Secondary | ICD-10-CM

## 2013-03-11 DIAGNOSIS — I82409 Acute embolism and thrombosis of unspecified deep veins of unspecified lower extremity: Secondary | ICD-10-CM

## 2013-03-11 DIAGNOSIS — R600 Localized edema: Secondary | ICD-10-CM

## 2013-03-11 LAB — PROTIME-INR: INR: 4 — ABNORMAL HIGH (ref 2.00–3.50)

## 2013-03-11 MED ORDER — CLONIDINE HCL 0.1 MG PO TABS: 0.0500 mg | ORAL_TABLET | Freq: Two times a day (BID) | ORAL | Status: DC

## 2013-03-11 NOTE — Progress Notes (Signed)
INR = 4 Pt has been taking alt 5 mg/7.5 mg even on weekends.  We had instructed her at last visit to take 5 mg/day; 7.5 mg on MWF. Pt has also been drinking more EtOH.  She has been drinking "at least" 3 beers every other night & had 1 glass of wine over the weekend. No bleeding & bruising is "no more than usual." No lightheadedness/dizziness over the weekend. INR above goal due to increased EtOH. Hold x 2 doses then take 5 mg/day; 7.5 mg MWF.  She understands she won't be taking 7.5 mg over the weekend. Return 1 week unless sxs of bleeding/bruising occur.  She will "try her best" to stop drinking EtOH.  She is seeing Dr. [name] next week on 03/21/13. Pt requested to s/w Mena Pauls, RN today re: hot flashes.  She does not use gabapentin.  It makes her feel "weird."  She will talk to [facility] after clinic visit today.  Med change may be made.   Ebony Hail, Pharm.D., CPP 03/11/2013@12 :04 PM

## 2013-03-15 ENCOUNTER — Telehealth: Payer: Self-pay | Admitting: *Deleted

## 2013-03-15 NOTE — Telephone Encounter (Signed)
Patient calling to report that the 1/2 tablet of Clonidine bid has been working wonders on her hot flashes, however, she still has some occasional breakthroughs mid-day. Spoke with Dr [name], it will be ok if patient takes 1/2 tablet up to 3 times daily, occasionally. However, patient needs to be very observant for signs of hypotension. These were discussed with patient, and instructed to use cautiously and call us for any questions or problems.

## 2013-03-18 ENCOUNTER — Ambulatory Visit: Payer: Self-pay

## 2013-03-18 ENCOUNTER — Other Ambulatory Visit: Payer: Self-pay | Admitting: Lab

## 2013-03-19 ENCOUNTER — Other Ambulatory Visit: Payer: Self-pay | Admitting: Lab

## 2013-03-19 ENCOUNTER — Ambulatory Visit: Payer: Self-pay

## 2013-03-21 ENCOUNTER — Ambulatory Visit (HOSPITAL_BASED_OUTPATIENT_CLINIC_OR_DEPARTMENT_OTHER): Payer: Self-pay

## 2013-03-21 ENCOUNTER — Telehealth: Payer: Self-pay | Admitting: *Deleted

## 2013-03-21 ENCOUNTER — Other Ambulatory Visit (HOSPITAL_BASED_OUTPATIENT_CLINIC_OR_DEPARTMENT_OTHER): Payer: Self-pay | Admitting: Lab

## 2013-03-21 ENCOUNTER — Ambulatory Visit: Payer: Self-pay | Admitting: Pharmacist

## 2013-03-21 ENCOUNTER — Other Ambulatory Visit: Payer: Self-pay | Admitting: *Deleted

## 2013-03-21 ENCOUNTER — Ambulatory Visit (HOSPITAL_BASED_OUTPATIENT_CLINIC_OR_DEPARTMENT_OTHER): Payer: Self-pay | Admitting: Oncology

## 2013-03-21 ENCOUNTER — Encounter (INDEPENDENT_AMBULATORY_CARE_PROVIDER_SITE_OTHER): Payer: Self-pay

## 2013-03-21 VITALS — BP 133/92 | HR 81 | Temp 98.1°F | Resp 18 | Ht 65.0 in | Wt 140.8 lb

## 2013-03-21 DIAGNOSIS — C50411 Malignant neoplasm of upper-outer quadrant of right female breast: Secondary | ICD-10-CM | POA: Insufficient documentation

## 2013-03-21 DIAGNOSIS — M792 Neuralgia and neuritis, unspecified: Secondary | ICD-10-CM

## 2013-03-21 DIAGNOSIS — C50919 Malignant neoplasm of unspecified site of unspecified female breast: Secondary | ICD-10-CM

## 2013-03-21 DIAGNOSIS — IMO0001 Reserved for inherently not codable concepts without codable children: Secondary | ICD-10-CM

## 2013-03-21 DIAGNOSIS — I82729 Chronic embolism and thrombosis of deep veins of unspecified upper extremity: Secondary | ICD-10-CM

## 2013-03-21 DIAGNOSIS — C50911 Malignant neoplasm of unspecified site of right female breast: Secondary | ICD-10-CM

## 2013-03-21 DIAGNOSIS — R600 Localized edema: Secondary | ICD-10-CM

## 2013-03-21 LAB — COMPREHENSIVE METABOLIC PANEL (CC13)
ALT: 30 U/L (ref 0–55)
AST: 27 U/L (ref 5–34)
Albumin: 4.4 g/dL (ref 3.5–5.0)
Alkaline Phosphatase: 73 U/L (ref 40–150)
BUN: 16 mg/dL (ref 7.0–26.0)
Chloride: 105 mEq/L (ref 98–109)
Potassium: 4.1 mEq/L (ref 3.5–5.1)

## 2013-03-21 LAB — PROTIME-INR
INR: 1 — ABNORMAL LOW (ref 2.00–3.50)
Protime: 12 Seconds (ref 10.6–13.4)

## 2013-03-21 MED ORDER — HEPARIN SOD (PORK) LOCK FLUSH 100 UNIT/ML IV SOLN
500.0000 [IU] | Freq: Once | INTRAVENOUS | Status: AC
  Administered 2013-03-21: 500 [IU] via INTRAVENOUS
  Filled 2013-03-21: qty 5

## 2013-03-21 MED ORDER — TRAMADOL HCL 50 MG PO TABS: 50.0000 mg | ORAL_TABLET | Freq: Four times a day (QID) | ORAL | Status: DC | PRN

## 2013-03-21 MED ORDER — ANASTROZOLE 1 MG PO TABS: 1.0000 mg | ORAL_TABLET | Freq: Every day | ORAL | Status: DC

## 2013-03-21 MED ORDER — SODIUM CHLORIDE 0.9 % IJ SOLN
10.0000 mL | INTRAMUSCULAR | Status: DC | PRN
  Administered 2013-03-21: 10 mL via INTRAVENOUS
  Filled 2013-03-21: qty 10

## 2013-03-21 NOTE — Patient Instructions (Signed)
Implanted Port Instructions  An implanted port is a central line that has a round shape and is placed under the skin. It is used for long-term IV (intravenous) access for:  · Medicine.  · Fluids.  · Liquid nutrition, such as TPN (total parenteral nutrition).  · Blood samples.  Ports can be placed:  · In the chest area just below the collarbone (this is the most common place.)  · In the arms.  · In the belly (abdomen) area.  · In the legs.  PARTS OF THE PORT  A port has 2 main parts:  · The reservoir. The reservoir is round, disc-shaped, and will be a small, raised area under your skin.  · The reservoir is the part where a needle is inserted (accessed) to either give medicines or to draw blood.  · The catheter. The catheter is a long, slender tube that extends from the reservoir. The catheter is placed into a large vein.  · Medicine that is inserted into the reservoir goes into the catheter and then into the vein.  INSERTION OF THE PORT  · The port is surgically placed in either an operating room or in a procedural area (interventional radiology).  · Medicine may be given to help you relax during the procedure.  · The skin where the port will be inserted is numbed (local anesthetic).  · 1 or 2 small cuts (incisions) will be made in the skin to insert the port.  · The port can be used after it has been inserted.  INCISION SITE CARE  · The incision site may have small adhesive strips on it. This helps keep the incision site closed. Sometimes, no adhesive strips are placed. Instead of adhesive strips, a special kind of surgical glue is used to keep the incision closed.  · If adhesive strips were placed on the incision sites, do not take them off. They will fall off on their own.  · The incision site may be sore for 1 to 2 days. Pain medicine can help.  · Do not get the incision site wet. Bathe or shower as directed by your caregiver.  · The incision site should heal in 5 to 7 days. A small scar may form after the  incision has healed.  ACCESSING THE PORT  Special steps must be taken to access the port:  · Before the port is accessed, a numbing cream can be placed on the skin. This helps numb the skin over the port site.  · A sterile technique is used to access the port.  · The port is accessed with a needle. Only "non-coring" port needles should be used to access the port. Once the port is accessed, a blood return should be checked. This helps ensure the port is in the vein and is not clogged (clotted).  · If your caregiver believes your port should remain accessed, a clear (transparent) bandage will be placed over the needle site. The bandage and needle will need to be changed every week or as directed by your caregiver.  · Keep the bandage covering the needle clean and dry. Do not get it wet. Follow your caregiver's instructions on how to take a shower or bath when the port is accessed.  · If your port does not need to stay accessed, no bandage is needed over the port.  FLUSHING THE PORT  Flushing the port keeps it from getting clogged. How often the port is flushed depends on:  · If a   constant infusion is running. If a constant infusion is running, the port may not need to be flushed.  · If intermittent medicines are given.  · If the port is not being used.  For intermittent medicines:  · The port will need to be flushed:  · After medicines have been given.  · After blood has been drawn.  · As part of routine maintenance.  · A port is normally flushed with:  · Normal saline.  · Heparin.  · Follow your caregiver's advice on how often, how much, and the type of flush to use on your port.  IMPORTANT PORT INFORMATION  · Tell your caregiver if you are allergic to heparin.  · After your port is placed, you will get a manufacturer's information card. The card has information about your port. Keep this card with you at all times.  · There are many types of ports available. Know what kind of port you have.  · In case of an  emergency, it may be helpful to wear a medical alert bracelet. This can help alert health care workers that you have a port.  · The port can stay in for as long as your caregiver believes it is necessary.  · When it is time for the port to come out, surgery will be done to remove it. The surgery will be similar to how the port was put in.  · If you are in the hospital or clinic:  · Your port will be taken care of and flushed by a nurse.  · If you are at home:  · A home health care nurse may give medicines and take care of the port.  · You or a family member can get special training and directions for giving medicine and taking care of the port at home.  SEEK IMMEDIATE MEDICAL CARE IF:   · Your port does not flush or you are unable to get a blood return.  · New drainage or pus is coming from the incision.  · A bad smell is coming from the incision site.  · You develop swelling or increased redness at the incision site.  · You develop increased swelling or pain at the port site.  · You develop swelling or pain in the surrounding skin near the port.  · You have an oral temperature above 102° F (38.9° C), not controlled by medicine.  MAKE SURE YOU:   · Understand these instructions.  · Will watch your condition.  · Will get help right away if you are not doing well or get worse.  Document Released: 04/18/2005 Document Revised: 07/11/2011 Document Reviewed: 07/10/2008  [employer]® Patient Information ©2014 [employer].

## 2013-03-21 NOTE — Progress Notes (Signed)
Of whom are in an and a the a the IES ID: Crystal [name]   DOB: 01/18/1976  MR#: [id number]  ION#:[id number]  KGM:[id number], MD GYN: SU:  Royston Sinner, MD OTHER:  [name], Alben Deeds    HISTORY OF PRESENT ILLNESS: The patient is a 37 year old [place] woman who noted a mass in her right breast and brought it to her primary care physician's attention June of 2012. She was set up for mammography at [facility], where an area of calcifications highly suspicious for carcinoma was biopsied. This was in the upper outer quadrant and it measured 1.7 cm mammographically and 1.8 cm by ultrasonography. The pathology report ([id number]) showed a high-grade invasive ductal carcinoma estrogen receptor positive at 64% progesterone receptor positive at 18%, and HER-2 amplified with a ratio by CISH of 5.24. MIB-1-1 was 65%. A second mass biopsied at the same time was also triple positive.  Given her multiple masses in the right breast, mastectomy was recommended. The patient actually opted for bilateral mastectomies and this was performed together with a right axillary lymph node dissection under Dr. Felicity Pellegrini on 11/16/2010. The final pathology [id number]) showed, on the left, no evidence of cancer. On the right the patient had a high-grade invasive ductal carcinoma measuring 1.9 cm, with negative margins, grade 3, involving 2 of 16 lymph nodes (stage IIB).  Her subsequent history is as detailed below.  INTERVAL HISTORY:  Crystal [name] returns today for followup of her breast cancer. She tells me she forgot to take her Coumadin for the last few days, which explains why her PT/INR is 1.0. She was hoping we could stop it at this point. She has had no bleeding or bruising complications.    REVIEW OF SYSTEMS: Emeline continues to experience significant fibromyalgia symptoms. These are very variable almost from day to day. She is taking Tylenol 1 g 3 times a day. She tells me the gabapentin made her woozy and she  did not like the way it made her feel. She decided she did not want to go to a pain clinic. She describes herself as significantly fatigued. She hurts in her bones. She is almost constant headaches. She has some sinus problems. Her hot flashes are worse. They did improve initially with clonidine but she has been afraid of using that medication 3 times a day. Aside from these problems, a detailed review of systems today was noncontributory  PAST MEDICAL HISTORY: Past Medical History  Diagnosis Date  . Status post chemotherapy     docetaxel/carboplatin/trastuzumab  . Anxiety   . Hx MRSA infection 03/28/11    Skin  . History of breast cancer DX JUNE 2012 W/ RIGHT BREAST INVASIVE DUCTAL CA  STAGE IIB---  S/P BILATERAL MASTECTOMIES AND RIGHT NODE DISSECTION    ONCOLOGIST-- DR [name]--  CHEMO ENDED DEC 2012 / RADIATION ENDED MAR 2013--  CURRENT ON TAMOXIFEN AND ZOLADEX  . Family history of anesthesia complication     MOTHER PONV  . Hot flashes due to tamoxifen   . History of idiopathic seizure     DX 2008 --  LAST ONE 2012--  NO ISSUES SINCE AND NO MEDS. -- PT STATES DOCTORS FELT IT WAS STRESS RELATED DUE TO HEALTH ISSUES  . Bipolar affective disorder, mixed   . GERD (gastroesophageal reflux disease)     OCCASIONALLY TAKE ZANTAC  . Left ovarian cyst   . Right lower quadrant abdominal pain   . Frequency of urination   . Urgency of urination   .  Strains to urinate   . Nocturia   . Heart palpitations   . Cancer   . Depression   . Seizures   1. History of seizures. The patient has been thoroughly evaluated both here by Dr. [name] and at [facility] for this. Among many studies, she had an MRI of the brain on August 28th. This showed an incidental solitary small lesion in the left frontoparietal white matter. It was not felt to be suggestive of multiple sclerosis. A noncontrasted CT in August 2010 was negative. The patient has been off all seizure medications now for 2 months. There has been  no evidence of seizure recurrence. 2. Complex psychology history (please refer to Dr. [name] note in the E-chart from August 29, 2009) with evidence of anxiety disorder, depression and psychosis. The patient is currently on no medications for this and denies any of these symptoms at present. 3. History of migraines. 4. Remote history of multidrug abuse. 5. Status post wisdom teeth extraction.         History of MRSA skin infection   PAST SURGICAL HISTORY: Past Surgical History  Procedure Laterality Date  . Wisdom tooth extraction    . Biopsy breast  10/14/2010    right needle core biopsy  . Portacath placement  11/16/2010    placement of left subclavian port  . I&d extremity  09/26/2011    Procedure: IRRIGATION AND DEBRIDEMENT EXTREMITY;  Surgeon: [name] Ribas, MD;  Location: [facility];  Service: Orthopedics;  Laterality: Right;  I&D right hand cat bite wound  . Breast surgery  11/16/2010    bilateral mastectomy+ right axillary node dissection,T1cN1a, Her2+,ERPR+  (right breast invasive ductal carcinoma)  . Transthoracic echocardiogram  05-28-2012    LVF NORMAL/  EF 55-60%  . Cysto with hydrodistension N/A 07/23/2012    Procedure: CYSTOSCOPY/HYDRODISTENSION instillation of marcaine and pyridium;  Surgeon: Martina Sinner, MD;  Location: [facility];  Service: Urology;  Laterality: N/A;  INSTILLATION      FAMILY HISTORY Family History  Problem Relation Age of Onset  . Cancer Maternal Grandfather   . Mental illness Brother   . Mental illness Son   The patient's mother is living, in her mid 62s, with no history of breast or ovarian cancer. The patient does not know her biological father. The patient has an older brother with bipolar disease, and according to the patient, borderline schizophrenia.   GYNECOLOGIC HISTORY: Menarche at age 78. She is G1, P1 with first pregnancy to term at age 59. . She continues on every three-month goserelin.  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 Her husband , [name], works as a Administrator. In addition to them at home is the patient's son, [name], who attends [employer] in the 11TH grade. [name], according to the patient, has significant nausea problems. The patient attends a local [address]. [name] is pastor.    ADVANCED DIRECTIVES: Not in place  HEALTH MAINTENANCE: History  Substance Use Topics  . Smoking status: Current Every Day Smoker -- 1.00 packs/day for 20 years    Types: Cigarettes  . Smokeless tobacco: Never Used  . Alcohol Use: Yes     Colonoscopy:  PAP:  Bone density:  Lipid panel:  Allergies  Allergen Reactions  . Tramadol Other (See Comments)    Seizures  . Phenytoin Nausea And Vomiting  . Thorazine [Chlorpromazine Hcl] Hives  .  Ciprofloxacin Nausea And Vomiting  . Vancomycin Rash    Possible rash reported by MD    Current Outpatient Prescriptions  Medication Sig Dispense Refill  . acetaminophen (TYLENOL) 500 MG tablet Take 1,000 mg by mouth every 8 (eight) hours as needed for pain.      Marland Kitchen ALPRAZolam (XANAX) 1 MG tablet Take 1 three times a day for anxiety.  90 tablet  4  . carvedilol (COREG) 6.25 MG tablet Take 6.25 mg by mouth daily as needed. For heart palpitations (rapid heart beat)      . citalopram (CELEXA) 20 MG tablet TAKE TWO TABLETS BY MOUTH DAILY. take one tablet a day then may increase to 2 tablets after one week  60 tablet  3  . citalopram (CELEXA) 40 MG tablet Take 40 mg by mouth daily.      . cloNIDine (CATAPRES) 0.1 MG tablet Take 0.5 tablets (0.05 mg total) by mouth 2 (two) times daily.  30 tablet  1  . goserelin (ZOLADEX) 10.8 MG injection Inject 10.8 mg into the skin every 3 (three) months.      . ranitidine (ZANTAC) 150 MG tablet Take 150 mg by mouth 2 (two) times daily as needed for heartburn.      . warfarin (COUMADIN) 5 MG tablet Take 5-7.5 mg by mouth daily. Every other day (Monday  Wednesday Friday Sunday) take 1.5 tablets (7.5mg  total) and the other days (Tuesday Thursday Saturday) take 5mg  (one tablet)       No current facility-administered medications for this visit.   Facility-Administered Medications Ordered in Other Visits  Medication Dose Route Frequency Provider Last Rate Last Dose  . bupivacaine (MARCAINE) 0.5 % 15 mL, phenazopyridine (PYRIDIUM) 400 mg bladder mixture   Bladder Instillation Once Martina Sinner, MD       OBJECTIVE: Young white female in no acute distress Filed Vitals:   03/21/13 1122  BP: 133/92  Pulse: 81  Temp: 98.1 F (36.7 C)  Resp: 18  ECOG: 1 Body mass index is 23.43 kg/(m^2).  Filed Weights   03/21/13 1122  Weight: 140 lb 12.8 oz (63.866 kg)   Sclerae unicteric, pupils equal round and reactive Oropharynx clear, dentition is fair No cervical or supraclavicular adenopathy Lungs no rales or rhonchi Heart regular rate and rhythm Abd soft, nontender, positive bowel sounds MSK no focal spinal tenderness, no upper extremity lymphedema Neuro: nonfocal, well oriented, pleasant affect  Breasts: status post bilateral mastectomies. No evidence of local recurrence. Both axillae are benign     LAB RESULTS: Lab Results  Component Value Date   WBC 15.6* 01/23/2013   NEUTROABS 12.4* 01/23/2013   HGB 15.0 01/23/2013   HCT 42.0 01/23/2013   MCV 94.0 01/23/2013   PLT 164 01/23/2013      Chemistry      Component Value Date/Time   NA 138 03/21/2013 1053   NA 138 08/22/2012 1700   K 4.1 03/21/2013 1053   K 3.7 08/22/2012 1700   CL 105 10/11/2012 1441   CL 103 08/22/2012 1700   CO2 21* 03/21/2013 1053   CO2 23 08/22/2012 1700   BUN 16.0 03/21/2013 1053   BUN 11 08/22/2012 1700   CREATININE 0.8 03/21/2013 1053   CREATININE 0.96 08/22/2012 1700      Component Value Date/Time   CALCIUM 10.0 03/21/2013 1053   CALCIUM 9.1 08/22/2012 1700   ALKPHOS 73 03/21/2013 1053   ALKPHOS 87 08/22/2012 1700   AST 27 03/21/2013 1053   AST 33  08/22/2012 1700   ALT 30 03/21/2013 1053   ALT 24 08/22/2012 1700   BILITOT 0.49 03/21/2013 1053   BILITOT 0.2* 08/22/2012 1700         STUDIES:  Mr [name] AV Contrast  01/16/2013   *RADIOLOGY REPORT*  Clinical Data:  Speech disturbance.  Gait disturbance.  Numbness and weakness.  History breast cancer.  MRI HEAD WITHOUT AND WITH CONTRAST MRI CERVICAL SPINE WITHOUT AND WITH CONTRAST  Technique:  Multiplanar, multiecho pulse sequences of the brain and surrounding structures, and cervical spine, to include the craniocervical junction and cervicothoracic junction, were obtained without and with intravenous contrast.  Contrast: 12mL MULTIHANCE GADOBENATE DIMEGLUMINE 529 MG/ML IV SOLN  Comparison:  07/14/2011  MRI HEAD  Findings:  Diffusion imaging does not show any acute or subacute infarction.  Again demonstrated is abnormal signal throughout the pons.  This could relate to post treatment effect. A few foci of T2 and FLAIR signal in the left frontal white matter are unchanged. No evidence of mass lesion, hemorrhage, hydrocephalus or extra- axial collection.  After contrast administration, no abnormal enhancement occurs.  No pituitary mass.  No inflammatory sinus disease.  No skull or skull base lesion.  IMPRESSION: No evidence of metastatic disease.  Chronic abnormal T2 signal within the pons, probably related to previous treatment.  MRI CERVICAL SPINE  Findings: Alignment is normal.  There is no evidence of metastatic disease in the region.  The foramen magnum is widely patent.  C1-2, C2-3, C3-4 and C4-5 are normal.  C5-6:  Bulging of the disc but no stenosis or neural compression.  C6-7:  Spondylosis with endplate osteophytes and shallow disc herniation.  Narrowing of ventral subarachnoid space but no compressive effect upon the cord.  Mild foraminal narrowing without gross neural compression.  C7-T1:  Normal.  No abnormal cord signal.  No abnormal contrast enhancement.  IMPRESSION: No evidence of  metastatic disease.  Mild degenerative disease at C5- 6 and C6-7 without gross neural compression.   Original Report Authenticated By: Paulina Fusi, M.D.    Nm Bone Scan Whole Body  01/08/2013   *RADIOLOGY REPORT*  Clinical Data: Restaging breast cancer.  NUCLEAR MEDICINE WHOLE BODY BONE SCINTIGRAPHY  Technique:  Whole body anterior and posterior images were obtained approximately 3 hours after intravenous injection of radiopharmaceutical.  Radiopharmaceutical: CURIE TC-MDP TECHNETIUM TC 53M MEDRONATE IV KIT  Comparison: 10/27/2010  Findings: There is expected uptake in the renal collecting system. No suspicious uptake involving the axial or appendicular skeleton. No abnormal uptake in the ribs.  IMPRESSION: Negative whole body bone scan.  No evidence for metastatic bone disease.   Original Report Authenticated By: Richarda Overlie, M.D.      ASSESSMENT: 37 y.o.  [place] woman, known to be BRCA1 and 2 negative,   (1) status post bilateral mastectomies May of 2012 for a right-sided T1c N1 (stage II) grade 3 invasive ductal carcinoma,estrogen and progesterone receptor positive, HER-2 amplified, with an MIB-1-1 of 85%.   (2) status post carboplatin/docetaxol/trastuzumab x6, completed mid December 2012   (3) on trastuzumab every 3 weeks, started August of 2012, interrupted between April and August of 2013, completed 04/27/2012. Most recent echo 03/19/2012  (4) status post radiation therapy, completed 07/12/2011  (5) On tamoxifen since April 2013, held as of 12/27/2012 with the diagnosis of a left upper extremity DVT.  On therapeutic Coumadin since that time.   (6) On goserelin given every 3 months, 1st injection on 10/26/11, most recent dose 01/09/2013.   (  7) started on anastrozole November 2014  Other problems:  (1) Chronic Pain/ fibromyalgia  (2) history of seizure disorder    PLAN:  Swannie is doing fine from a breast cancer point of view. As far as her chronic pain and fibromyalgia  are concerned, she understands in the absence of any evidence of active cancer I cannot prescribe narcotics for her. The choices are Tylenol, naproxen, tramadol, and gabapentin. She is a radiology 3 g of Tylenol a day and I would not want her to increase that. She tells me the gabapentin mixture woozy. Between Naprosyn and Tylenol she decided to give the tramadol a try and I wrote her a prescription to take 50 mg 4 times a day as needed. She understands the possible toxicities, side effects and complications of this medication.  We had made an appointment for a pain clinic for her but she decided against it. We may want to reconsider that if pain is not well-controlled on the above options.  She does need to take her Coumadin as prescribed and I encouraged her to do that. She will be on Coumadin another 4 months, total of 6 months from her August the venous thrombus. I also encouraged her to take her clonidine 3 times a day for the hot flashes.  Today we talked about anastrozole. She understands as a different mechanism of action and then tamoxifen. It certainly can cause of fibromyalgia-like syndrome in many patients find it very difficult to tell the difference. Accordingly I am not optimistic that she will be able to tolerate this medication. It is worth trying however and I went ahead and sent the prescription.  She will see [place] again in 2 months. If she is tolerating the anastrozole well at that point, she will see me again 3 months after that, one month after completing her 6 months of anticoagulation. We will do a d-dimer at that time.  She knows to call for problems that may develop before her next visit here   Milam Allbaugh C    03/21/2013

## 2013-03-21 NOTE — Telephone Encounter (Signed)
appts made and printed...td 

## 2013-03-21 NOTE — Patient Instructions (Signed)
Take 2 tablets today (10mg ) and then resume 5mg  daily with 7.5 mg on MWF. Recheck INR on Monday 04/01/13 at 10 am for lab, 10:15 am for coumadin clinic and 10:30 injection appmt.

## 2013-03-21 NOTE — Progress Notes (Signed)
Pt seen today in clinic prior to her MD appmt INR=1.0 She has missed a few doses and we held 2 doses last week, since she was supratherapeutic last visit. She will take 2 tablets (10mg ) today, then resume her previous dose of 5mg  daily with 7.5 mg MWF She said "I drink alcohol and it goes too high and then I forget a few doses I am too low." No other changes to report.  No signs of clot that she is aware of She will discuss anticoag plans with MD as she "wishes she could just come off coumadin." RTC in 10 days with injection appmt.

## 2013-03-27 ENCOUNTER — Telehealth: Payer: Self-pay | Admitting: *Deleted

## 2013-03-27 NOTE — Telephone Encounter (Signed)
Called Charina about injection appointment scheduled for 04/01/13.  She is not due for her injection on this date, it is due 04/29/13.  Appointment changed and patient is aware of new date and time.

## 2013-04-01 ENCOUNTER — Ambulatory Visit (HOSPITAL_BASED_OUTPATIENT_CLINIC_OR_DEPARTMENT_OTHER): Payer: Self-pay | Admitting: Pharmacist

## 2013-04-01 ENCOUNTER — Ambulatory Visit: Payer: Self-pay

## 2013-04-01 ENCOUNTER — Other Ambulatory Visit (HOSPITAL_BASED_OUTPATIENT_CLINIC_OR_DEPARTMENT_OTHER): Payer: Self-pay | Admitting: Lab

## 2013-04-01 DIAGNOSIS — R600 Localized edema: Secondary | ICD-10-CM

## 2013-04-01 DIAGNOSIS — I82409 Acute embolism and thrombosis of unspecified deep veins of unspecified lower extremity: Secondary | ICD-10-CM

## 2013-04-01 DIAGNOSIS — M792 Neuralgia and neuritis, unspecified: Secondary | ICD-10-CM

## 2013-04-01 DIAGNOSIS — C50911 Malignant neoplasm of unspecified site of right female breast: Secondary | ICD-10-CM

## 2013-04-01 DIAGNOSIS — I82729 Chronic embolism and thrombosis of deep veins of unspecified upper extremity: Secondary | ICD-10-CM

## 2013-04-01 LAB — PROTIME-INR
INR: 1.2 — ABNORMAL LOW (ref 2.00–3.50)
Protime: 14.4 Seconds — ABNORMAL HIGH (ref 10.6–13.4)

## 2013-04-01 LAB — POCT INR: INR: 1.2

## 2013-04-01 NOTE — Progress Notes (Signed)
INR = 1.2    Goal 2-3 Patient states she has not been taking her Coumadin.  She took it a few times this week but not regularly. She has been drinking alcohol "a lot" this week. She is aware that she has a drinking problem and is trying to improve. She states she has not been taking Tramadol or clonidine and does not plan to resume either of these medications. She is afraid Tramadol will cause her to have seizures and she does not feel well when she takes it. Dr. [name] has recommended that she begins to be seen at a pain clinic.  She has not done this yet because she is afraid it will be expensive.  She will call about this when she gets home as she is having difficulty with both her fibromyalgia and headaches. She states that she has a weekly medicine minder but still does not remember to take her Coumadin.  She feels that as long as she is drinking, she will be unable to be compliant with her Coumadin. She is aware of the risks of clotting or bleeding if she does not comply. She will resume taking her Coumadin today.  Dosing will be 10 mg today, 5 mg daily, except 7.5 mg MWF.  She will return to Coumadin Clinic in 1 week on Monday 12/8 for lab at 10:30am, Coumadin Clinic at 10:45.  Cletis Athens, PharmD

## 2013-04-08 ENCOUNTER — Other Ambulatory Visit: Payer: Self-pay

## 2013-04-08 ENCOUNTER — Ambulatory Visit: Payer: Self-pay

## 2013-04-11 ENCOUNTER — Ambulatory Visit (HOSPITAL_BASED_OUTPATIENT_CLINIC_OR_DEPARTMENT_OTHER): Payer: Self-pay | Admitting: Pharmacist

## 2013-04-11 ENCOUNTER — Other Ambulatory Visit: Payer: Self-pay | Admitting: *Deleted

## 2013-04-11 ENCOUNTER — Other Ambulatory Visit (HOSPITAL_BASED_OUTPATIENT_CLINIC_OR_DEPARTMENT_OTHER): Payer: Self-pay

## 2013-04-11 DIAGNOSIS — C50911 Malignant neoplasm of unspecified site of right female breast: Secondary | ICD-10-CM

## 2013-04-11 DIAGNOSIS — I82409 Acute embolism and thrombosis of unspecified deep veins of unspecified lower extremity: Secondary | ICD-10-CM

## 2013-04-11 DIAGNOSIS — R600 Localized edema: Secondary | ICD-10-CM

## 2013-04-11 DIAGNOSIS — M792 Neuralgia and neuritis, unspecified: Secondary | ICD-10-CM

## 2013-04-11 LAB — PROTIME-INR
INR: 2.8 (ref 2.00–3.50)
Protime: 33.6 Seconds — ABNORMAL HIGH (ref 10.6–13.4)

## 2013-04-11 LAB — POCT INR: INR: 2.8

## 2013-04-11 MED ORDER — GABAPENTIN 300 MG PO CAPS: ORAL_CAPSULE | ORAL | Status: DC

## 2013-04-11 NOTE — Progress Notes (Signed)
INR = 2.8    Goal range 2-3 Patient states she has missed only 1 dose of Coumadin since coming in on 12/1. She has started using her pill box and it is helping her to remember to take her medication. She has had no bleeding/bruising "other than what I usually have." She had no medication or dietary changes. Encouraged patient to keep taking her medication regularly. She requests to speak with Rona Ravens or Zollie Scale following this appointment.   Spoke with Vikki Ports, she will see Ms. Medina following this appointment. Patient requests to be seen on a Tuesday if possible. She will return in 1.5 weeks on 04/23/13 for lab at 11:00 and Coumadin Clinic at 11:15.  Cletis Athens, PharmD

## 2013-04-23 ENCOUNTER — Telehealth: Payer: Self-pay | Admitting: Pharmacist

## 2013-04-23 ENCOUNTER — Other Ambulatory Visit: Payer: Self-pay

## 2013-04-23 ENCOUNTER — Ambulatory Visit: Payer: Self-pay

## 2013-04-29 ENCOUNTER — Other Ambulatory Visit: Payer: Self-pay | Admitting: Oncology

## 2013-04-29 ENCOUNTER — Ambulatory Visit (HOSPITAL_BASED_OUTPATIENT_CLINIC_OR_DEPARTMENT_OTHER): Payer: Self-pay

## 2013-04-29 ENCOUNTER — Other Ambulatory Visit: Payer: Self-pay | Admitting: Physician Assistant

## 2013-04-29 VITALS — BP 120/92 | HR 84 | Temp 98.8°F

## 2013-04-29 DIAGNOSIS — Z5111 Encounter for antineoplastic chemotherapy: Secondary | ICD-10-CM

## 2013-04-29 DIAGNOSIS — C50919 Malignant neoplasm of unspecified site of unspecified female breast: Secondary | ICD-10-CM

## 2013-04-29 MED ORDER — GOSERELIN ACETATE 10.8 MG ~~LOC~~ IMPL
10.8000 mg | DRUG_IMPLANT | SUBCUTANEOUS | Status: DC
  Administered 2013-04-29: 10.8 mg via SUBCUTANEOUS
  Filled 2013-04-29: qty 10.8

## 2013-05-09 ENCOUNTER — Telehealth: Payer: Self-pay

## 2013-05-09 DIAGNOSIS — R002 Palpitations: Secondary | ICD-10-CM

## 2013-05-09 NOTE — Telephone Encounter (Signed)
Patient states that her cardiologist has asked that patient's primary care doctor (Dr. Lorelei Pont) manage her heart medication now. Patient currently takes Carvedilol 6.25 mg, 1 tablet in the morning and 1 1/2 tablets in the evening. Patient wants to know if she can get this filled. [facility]  [phone]

## 2013-05-10 MED ORDER — CARVEDILOL 6.25 MG PO TABS: ORAL_TABLET | ORAL | Status: DC

## 2013-05-13 MED ORDER — CARVEDILOL 6.25 MG PO TABS: ORAL_TABLET | ORAL | Status: DC

## 2013-05-13 NOTE — Telephone Encounter (Addendum)
Called pt to advise Rx was sent in, but to [place]. Pt asked that it be changed to [facility]. Resent. Cancelled at [facility].

## 2013-05-13 NOTE — Addendum Note (Signed)
Addended by: Elwyn Reach A on: 05/13/2013 03:39 PM   Modules accepted: Orders

## 2013-05-16 ENCOUNTER — Telehealth: Payer: Self-pay | Admitting: Pharmacist

## 2013-05-20 ENCOUNTER — Ambulatory Visit: Payer: Self-pay | Admitting: Family Medicine

## 2013-05-20 ENCOUNTER — Other Ambulatory Visit: Payer: Self-pay | Admitting: Family Medicine

## 2013-05-20 VITALS — BP 110/70 | HR 96 | Temp 98.0°F | Resp 16 | Ht 66.0 in | Wt 144.0 lb

## 2013-05-20 DIAGNOSIS — G43919 Migraine, unspecified, intractable, without status migrainosus: Secondary | ICD-10-CM

## 2013-05-20 DIAGNOSIS — G43909 Migraine, unspecified, not intractable, without status migrainosus: Secondary | ICD-10-CM

## 2013-05-20 DIAGNOSIS — F411 Generalized anxiety disorder: Secondary | ICD-10-CM

## 2013-05-20 MED ORDER — ALPRAZOLAM 1 MG PO TABS: ORAL_TABLET | ORAL | Status: DC

## 2013-05-20 MED ORDER — HYDROCODONE-ACETAMINOPHEN 5-325 MG PO TABS: 1.0000 | ORAL_TABLET | Freq: Three times a day (TID) | ORAL | Status: DC | PRN

## 2013-05-20 NOTE — Patient Instructions (Signed)
You may use the hydrocodone as needed for migraine headache.  Remember that combining this medication with your xanax can caused increased or dangerous sedation.  I would recommend that you not combine these medications and that you decrease your xanax dose if you are using both.

## 2013-05-20 NOTE — Progress Notes (Signed)
[facility] [address] [place] [address] [phone]  Date:  05/20/2013   Name:  Crystal [name]   DOB:  [birth year]   MRN:  [id number]  PCP:  Lamar Blinks, MD    Chief Complaint: Medication Refill   History of Present Illness:  Crystal [name] is a 38 y.o. very pleasant female patient who presents with the following:  She needs a refill of her xanax today. Her husband was ill recently as well with a viral infection.   She also seemed to catch this illness about 3 weeks ago.  She now feels better but wants me to check her ears today.  She has not run a fever.  She sees oncology tomorrow for a periodic visit. She is having a hard time with medication induced menopause and hot flashes.   She notes that she seemed to get a "migraine" from being around someone with cologne on in the waiting room. She wonders if she could have a few vicodin for this "so it won't set in."  She also needs a refill of her xanax.    Patient Active Problem List   Diagnosis Date Noted  . Breast cancer of upper-outer quadrant of right female breast 03/21/2013  . Chronic pain 01/09/2013  . Upper extremity weakness 01/09/2013  . DVT of upper extremity (deep vein thrombosis) 01/02/2013  . Arm edema 12/27/2012  . Neuropathic pain 12/27/2012  . Breast cancer, right 12/27/2012  . Chronic female pelvic pain 08/21/2012  . Cramps, muscle, general 08/20/2012  . Fatigue 08/20/2012  . Palpitations 01/18/2012  . Cough 11/07/2011  . Chemotherapy induced cardiomyopathy 09/08/2011  . Syncope 09/08/2011  . Diarrhea 09/08/2011  . Generalized convulsive seizure 07/13/2011  . Seizure disorder 07/13/2011  . Migraine 07/13/2011  . Anxiety disorder 07/13/2011  . Depression 07/13/2011  . Tachycardia - pulse 05/26/2011  . Hx MRSA infection 04/13/2011  . Anemia associated with chemotherapy 03/29/2011  . Cellulitis and abscess 03/27/2011  . Seizure 03/27/2011  . NAUSEA AND VOMITING 01/28/2009   . ABDOMINAL PAIN, RIGHT LOWER QUADRANT 01/28/2009    Past Medical History  Diagnosis Date  . Status post chemotherapy     docetaxel/carboplatin/trastuzumab  . Anxiety   . Hx MRSA infection 03/28/11    Skin  . History of breast cancer DX JUNE 2012 W/ RIGHT BREAST INVASIVE DUCTAL CA  STAGE IIB---  S/P BILATERAL MASTECTOMIES AND RIGHT NODE DISSECTION    ONCOLOGIST-- DR Jana Hakim--  CHEMO ENDED [place] 2012 / RADIATION ENDED MAR 2013--  CURRENT ON TAMOXIFEN AND ZOLADEX  . Family history of anesthesia complication     MOTHER PONV  . Hot flashes due to tamoxifen   . History of idiopathic seizure     DX 2008 --  LAST ONE 2012--  NO ISSUES SINCE AND NO MEDS. -- PT STATES DOCTORS FELT IT WAS STRESS RELATED DUE TO HEALTH ISSUES  . Bipolar affective disorder, mixed   . GERD (gastroesophageal reflux disease)     OCCASIONALLY TAKE ZANTAC  . Left ovarian cyst   . Right lower quadrant abdominal pain   . Frequency of urination   . Urgency of urination   . Strains to urinate   . Nocturia   . Heart palpitations   . Cancer   . Depression   . Seizures     Past Surgical History  Procedure Laterality Date  . Wisdom tooth extraction    . Biopsy breast  10/14/2010    right needle  core biopsy  . Portacath placement  11/16/2010    placement of left subclavian port  . I&d extremity  09/26/2011    Procedure: IRRIGATION AND DEBRIDEMENT EXTREMITY;  Surgeon: Tennis Must, MD;  Location: [facility];  Service: Orthopedics;  Laterality: Right;  I&D right hand cat bite wound  . Breast surgery  11/16/2010    bilateral mastectomy+ right axillary node dissection,T1cN1a, Her2+,ERPR+  (right breast invasive ductal carcinoma)  . Transthoracic echocardiogram  05-28-2012    LVF NORMAL/  EF 55-60%  . Cysto with hydrodistension N/A 07/23/2012    Procedure: CYSTOSCOPY/HYDRODISTENSION instillation of marcaine and pyridium;  Surgeon: Reece Packer, MD;  Location: [facility];  Service: Urology;  Laterality:  N/A;  INSTILLATION      History  Substance Use Topics  . Smoking status: Current Every Day Smoker -- 1.00 packs/day for 20 years    Types: Cigarettes  . Smokeless tobacco: Never Used  . Alcohol Use: Yes    Family History  Problem Relation Age of Onset  . Cancer Maternal Grandfather   . Mental illness Brother   . Mental illness Son     Allergies  Allergen Reactions  . Tramadol Other (See Comments)    Seizures  . Phenytoin Nausea And Vomiting  . Thorazine [Chlorpromazine Hcl] Hives  . Ciprofloxacin Nausea And Vomiting  . Vancomycin Rash    Possible rash reported by MD    Medication list has been reviewed and updated.  Current Outpatient Prescriptions on File Prior to Visit  Medication Sig Dispense Refill  . acetaminophen (TYLENOL) 500 MG tablet Take 1,000 mg by mouth every 8 (eight) hours as needed for pain.      Marland Kitchen ALPRAZolam (XANAX) 1 MG tablet Take 1 three times a day for anxiety.  90 tablet  4  . anastrozole (ARIMIDEX) 1 MG tablet Take 1 tablet (1 mg total) by mouth daily.  30 tablet  12  . carvedilol (COREG) 6.25 MG tablet For heart palpitations (rapid heart beat).  Take 1 tablet in the morning and 1 and 1/2 tabs in the evening  75 tablet  11  . citalopram (CELEXA) 20 MG tablet TAKE TWO TABLETS BY MOUTH DAILY. take one tablet a day then may increase to 2 tablets after one week  60 tablet  3  . citalopram (CELEXA) 40 MG tablet Take 40 mg by mouth daily.      Marland Kitchen goserelin (ZOLADEX) 10.8 MG injection Inject 10.8 mg into the skin every 3 (three) months.      . ranitidine (ZANTAC) 150 MG tablet Take 150 mg by mouth 2 (two) times daily as needed for heartburn.      . warfarin (COUMADIN) 5 MG tablet Take 5-7.5 mg by mouth daily. Every other day (Monday Wednesday Friday Sunday) take 1.5 tablets (7.73m total) and the other days (Tuesday Thursday Saturday) take 540m(one tablet)      . gabapentin (NEURONTIN) 300 MG capsule Start dose at 30070mID and in one week may increase to 2  tabs TID  180 capsule  3   No current facility-administered medications on file prior to visit.    Review of Systems:  As per HPI- otherwise negative.   Physical Examination: Filed Vitals:   05/20/13 1224  BP: 110/70  Pulse: 112  Temp: 98 F (36.7 C)  Resp: 16   Filed Vitals:   05/20/13 1224  Height: '5\' 6"'  (1.676 m)  Weight: 144 lb (65.318 kg)   Body mass index  is 23.25 kg/(m^2). Ideal Body Weight: Weight in (lb) to have BMI = 25: 154.6  GEN: WDWN, NAD, Non-toxic, A & O x 3, looks well HEENT: Atraumatic, Normocephalic. Neck supple. No masses, No LAD.  Bilateral TM wnl, oropharynx normal.  PEERL,EOMI.   Ears and Nose: No external deformity. CV: RRR, No M/G/R. No JVD. No thrill. No extra heart sounds. PULM: CTA B, no wheezes, crackles, rhonchi. No retractions. No resp. distress. No accessory muscle use.Marland Kitchen EXTR: No c/c/e NEURO Normal gait.  PSYCH: Normally interactive. Conversant. Not depressed or anxious appearing.  Calm demeanor.    Assessment and Plan: Anxiety state, unspecified - Plan: ALPRAZolam (XANAX) 1 MG tablet  Migraine headache - Plan: HYDROcodone-acetaminophen (NORCO/VICODIN) 5-325 MG per tablet  Refilled her xanax today.   After discussion did agree to give her a very small rx for vicodin for her headaches.  Encouraged her to avoid taking narcotics if at all possible as they can be habit forming.    Signed Lamar Blinks, MD

## 2013-05-21 ENCOUNTER — Encounter: Payer: Self-pay | Admitting: Physician Assistant

## 2013-05-21 ENCOUNTER — Ambulatory Visit (HOSPITAL_BASED_OUTPATIENT_CLINIC_OR_DEPARTMENT_OTHER): Payer: Self-pay | Admitting: Pharmacist

## 2013-05-21 ENCOUNTER — Encounter (HOSPITAL_COMMUNITY): Payer: Self-pay | Admitting: Emergency Medicine

## 2013-05-21 ENCOUNTER — Ambulatory Visit (HOSPITAL_BASED_OUTPATIENT_CLINIC_OR_DEPARTMENT_OTHER): Payer: Self-pay | Admitting: Physician Assistant

## 2013-05-21 ENCOUNTER — Other Ambulatory Visit: Payer: Self-pay | Admitting: Oncology

## 2013-05-21 ENCOUNTER — Other Ambulatory Visit: Payer: Self-pay | Admitting: *Deleted

## 2013-05-21 ENCOUNTER — Emergency Department (HOSPITAL_COMMUNITY)
Admission: EM | Admit: 2013-05-21 | Discharge: 2013-05-21 | Disposition: A | Discharge status: Home / Self Care | Payer: Self-pay | Attending: Emergency Medicine | Admitting: Emergency Medicine

## 2013-05-21 ENCOUNTER — Other Ambulatory Visit (HOSPITAL_BASED_OUTPATIENT_CLINIC_OR_DEPARTMENT_OTHER): Payer: Self-pay

## 2013-05-21 VITALS — BP 109/77 | HR 87 | Temp 97.8°F | Resp 98 | Ht 66.0 in | Wt 146.2 lb

## 2013-05-21 DIAGNOSIS — C50419 Malignant neoplasm of upper-outer quadrant of unspecified female breast: Secondary | ICD-10-CM

## 2013-05-21 DIAGNOSIS — I82409 Acute embolism and thrombosis of unspecified deep veins of unspecified lower extremity: Secondary | ICD-10-CM

## 2013-05-21 DIAGNOSIS — G43909 Migraine, unspecified, not intractable, without status migrainosus: Secondary | ICD-10-CM | POA: Insufficient documentation

## 2013-05-21 DIAGNOSIS — F419 Anxiety disorder, unspecified: Secondary | ICD-10-CM

## 2013-05-21 DIAGNOSIS — M792 Neuralgia and neuritis, unspecified: Secondary | ICD-10-CM

## 2013-05-21 DIAGNOSIS — F32A Depression, unspecified: Secondary | ICD-10-CM

## 2013-05-21 DIAGNOSIS — Z8614 Personal history of Methicillin resistant Staphylococcus aureus infection: Secondary | ICD-10-CM | POA: Insufficient documentation

## 2013-05-21 DIAGNOSIS — Z7901 Long term (current) use of anticoagulants: Secondary | ICD-10-CM

## 2013-05-21 DIAGNOSIS — G40909 Epilepsy, unspecified, not intractable, without status epilepticus: Secondary | ICD-10-CM

## 2013-05-21 DIAGNOSIS — F411 Generalized anxiety disorder: Secondary | ICD-10-CM

## 2013-05-21 DIAGNOSIS — Z79899 Other long term (current) drug therapy: Secondary | ICD-10-CM | POA: Insufficient documentation

## 2013-05-21 DIAGNOSIS — F17200 Nicotine dependence, unspecified, uncomplicated: Secondary | ICD-10-CM | POA: Insufficient documentation

## 2013-05-21 DIAGNOSIS — R600 Localized edema: Secondary | ICD-10-CM

## 2013-05-21 DIAGNOSIS — Z170 Estrogen receptor positive status [ER+]: Secondary | ICD-10-CM

## 2013-05-21 DIAGNOSIS — C50411 Malignant neoplasm of upper-outer quadrant of right female breast: Secondary | ICD-10-CM

## 2013-05-21 DIAGNOSIS — Z853 Personal history of malignant neoplasm of breast: Secondary | ICD-10-CM | POA: Insufficient documentation

## 2013-05-21 DIAGNOSIS — R5383 Other fatigue: Secondary | ICD-10-CM

## 2013-05-21 DIAGNOSIS — G8929 Other chronic pain: Secondary | ICD-10-CM

## 2013-05-21 DIAGNOSIS — F3160 Bipolar disorder, current episode mixed, unspecified: Secondary | ICD-10-CM | POA: Insufficient documentation

## 2013-05-21 DIAGNOSIS — C50911 Malignant neoplasm of unspecified site of right female breast: Secondary | ICD-10-CM

## 2013-05-21 DIAGNOSIS — K219 Gastro-esophageal reflux disease without esophagitis: Secondary | ICD-10-CM | POA: Insufficient documentation

## 2013-05-21 DIAGNOSIS — I82629 Acute embolism and thrombosis of deep veins of unspecified upper extremity: Secondary | ICD-10-CM

## 2013-05-21 DIAGNOSIS — R5381 Other malaise: Secondary | ICD-10-CM

## 2013-05-21 DIAGNOSIS — Z8742 Personal history of other diseases of the female genital tract: Secondary | ICD-10-CM | POA: Insufficient documentation

## 2013-05-21 DIAGNOSIS — F329 Major depressive disorder, single episode, unspecified: Secondary | ICD-10-CM

## 2013-05-21 LAB — COMPREHENSIVE METABOLIC PANEL (CC13)
ALBUMIN: 4.2 g/dL (ref 3.5–5.0)
ALT: 39 U/L (ref 0–55)
ANION GAP: 12 meq/L — AB (ref 3–11)
AST: 43 U/L — ABNORMAL HIGH (ref 5–34)
Alkaline Phosphatase: 77 U/L (ref 40–150)
BUN: 16.3 mg/dL (ref 7.0–26.0)
CO2: 28 meq/L (ref 22–29)
Calcium: 10 mg/dL (ref 8.4–10.4)
Chloride: 103 mEq/L (ref 98–109)
Creatinine: 0.9 mg/dL (ref 0.6–1.1)
Glucose: 120 mg/dl (ref 70–140)
POTASSIUM: 4.5 meq/L (ref 3.5–5.1)
SODIUM: 143 meq/L (ref 136–145)
TOTAL PROTEIN: 7 g/dL (ref 6.4–8.3)
Total Bilirubin: 0.42 mg/dL (ref 0.20–1.20)

## 2013-05-21 LAB — PROTIME-INR
INR: 1.3 — AB (ref 2.00–3.50)
Protime: 15.6 Seconds — ABNORMAL HIGH (ref 10.6–13.4)

## 2013-05-21 LAB — CBC WITH DIFFERENTIAL/PLATELET
BASO%: 0.7 % (ref 0.0–2.0)
Basophils Absolute: 0.1 10*3/uL (ref 0.0–0.1)
EOS ABS: 0.4 10*3/uL (ref 0.0–0.5)
EOS%: 4.7 % (ref 0.0–7.0)
HCT: 42.3 % (ref 34.8–46.6)
HGB: 14.7 g/dL (ref 11.6–15.9)
LYMPH#: 2.5 10*3/uL (ref 0.9–3.3)
LYMPH%: 33.1 % (ref 14.0–49.7)
MCH: 34.7 pg — ABNORMAL HIGH (ref 25.1–34.0)
MCHC: 34.7 g/dL (ref 31.5–36.0)
MCV: 100 fL (ref 79.5–101.0)
MONO#: 0.6 10*3/uL (ref 0.1–0.9)
MONO%: 7.6 % (ref 0.0–14.0)
NEUT%: 53.9 % (ref 38.4–76.8)
NEUTROS ABS: 4 10*3/uL (ref 1.5–6.5)
Platelets: 222 10*3/uL (ref 145–400)
RBC: 4.23 10*6/uL (ref 3.70–5.45)
RDW: 12.9 % (ref 11.2–14.5)
WBC: 7.5 10*3/uL (ref 3.9–10.3)

## 2013-05-21 MED ORDER — BUTALBITAL-APAP-CAFFEINE 50-500-40 MG PO TABS
1.0000 | ORAL_TABLET | ORAL | Status: DC | PRN
Start: 2013-05-21 — End: 2013-05-21

## 2013-05-21 MED ORDER — BUTALBITAL-APAP-CAFFEINE 50-500-40 MG PO TABS: 1.0000 | ORAL_TABLET | ORAL | Status: DC | PRN

## 2013-05-21 MED ORDER — METOCLOPRAMIDE HCL 5 MG/ML IJ SOLN
10.0000 mg | Freq: Once | INTRAMUSCULAR | Status: AC
  Administered 2013-05-21: 10 mg via INTRAMUSCULAR
  Filled 2013-05-21: qty 2

## 2013-05-21 MED ORDER — DEXAMETHASONE SODIUM PHOSPHATE 10 MG/ML IJ SOLN
10.0000 mg | Freq: Once | INTRAMUSCULAR | Status: AC
  Administered 2013-05-21: 10 mg via INTRAMUSCULAR
  Filled 2013-05-21: qty 1

## 2013-05-21 MED ORDER — HALOPERIDOL LACTATE 5 MG/ML IJ SOLN: 2.5000 mg | Freq: Once | INTRAMUSCULAR | Status: DC

## 2013-05-21 NOTE — Progress Notes (Signed)
[facility] CL provided pt with a list of primary care resources. Patient stated that she was pending Medicaid. Patient kept asking when the doctor was coming to see her. CL explained that it shouldn't be to much longer. Patient stated that is what everyone has been telling her. Patient stated that she would leave if she wasn't seen soon.

## 2013-05-21 NOTE — Progress Notes (Signed)
INR = 1.3  At last visit in Coumadin clinic on 04/11/13, pt was taking Coumadin 5 mg/day; 7.5 mg MWF She has not been compliant w/ her Coumadin recently (or her visits to Coumadin clinic).  She said today she has missed at least 2 of her doses of Coumadin recently. She stated she took Coumadin 7.5 mg last night. She has headaches that seem to be migraine-type w/ photosensitivity or smell auras.  She saw her PCP about this yesterday. She is interested in boosting her energy level & asked for my opinion about nutritional supplementation.  She wants something "good in her body." Today she has a drink in hand-- Naked Organic Coconut Water.  She said she started drinking this yesterday to help her nutritionally. Pt saw Amy Gwenlyn Found today & during the appt w/ Amy they discussed starting pt on Fiorcet for pain control.  However, this majorly interacts w/ Warfarin & I was able to have Mayah return the RX to me that Amy had written. EtOH is "on the weekends only." Amy Gwenlyn Found is stopping Anastrozole.  There should be no effect on the INR w/ stopping Anastrozole.  Amy will refer pt back to neurologist for pain management due to h/o seizures.  Pt is upset about this & states she cannot afford it. INR subtherapeutic.  She will take 7.5 mg today then get back on Coumadin 5 mg/day; 7.5 mg MWF. Return in 2 weeks.  We will need to review meds at that time to see if anything has changed re: neuro/pain/CNS. Kennith Center, Pharm.D., CPP 05/21/2013@1 :45 PM

## 2013-05-21 NOTE — ED Notes (Signed)
Pt brought here by Dr. Jana Hakim.  States that she had her chemo pill switched recently.  States that it has made her fibromyalgia flare up.  C/o headaches on and off for years.  States that it has gotten worse x 1 month.

## 2013-05-21 NOTE — ED Notes (Signed)
MD at bedside. 

## 2013-05-21 NOTE — ED Provider Notes (Signed)
CSN: [id number]     Arrival date & time 05/21/13  1330 History   First MD Initiated Contact with Patient 05/21/13 1609     Chief Complaint  Patient presents with  . Headache   (Consider location/radiation/quality/duration/timing/severity/associated sxs/prior Treatment) Patient is a 38 y.o. female presenting with headaches. The history is provided by the patient. No language interpreter was used.  Headache Pain location:  Generalized Quality:  Dull Radiates to:  Does not radiate Pain severity now: moderate to severe. Pain scale at highest: severe. Onset quality:  Unable to specify Duration:  4 weeks Timing:  Intermittent Progression:  Waxing and waning Chronicity:  Chronic Similar to prior headaches: yes   Relieved by:  Acetaminophen and resting in a darkened room Worsened by:  Light (smells) Ineffective treatments:  None tried Associated symptoms: nausea   Associated symptoms: no abdominal pain, no back pain, no congestion, no cough, no diarrhea, no fatigue, no fever, no focal weakness, no hearing loss, no neck pain, no neck stiffness, no numbness, no photophobia, no sore throat, no vomiting and no weakness     Past Medical History  Diagnosis Date  . Status post chemotherapy     docetaxel/carboplatin/trastuzumab  . Anxiety   . Hx MRSA infection 03/28/11    Skin  . History of breast cancer DX JUNE 2012 W/ RIGHT BREAST INVASIVE DUCTAL CA  STAGE IIB---  S/P BILATERAL MASTECTOMIES AND RIGHT NODE DISSECTION    ONCOLOGIST-- DR Jana Hakim--  CHEMO ENDED [place] 2012 / RADIATION ENDED MAR 2013--  CURRENT ON TAMOXIFEN AND ZOLADEX  . Family history of anesthesia complication     MOTHER PONV  . Hot flashes due to tamoxifen   . History of idiopathic seizure     DX 2008 --  LAST ONE 2012--  NO ISSUES SINCE AND NO MEDS. -- PT STATES DOCTORS FELT IT WAS STRESS RELATED DUE TO HEALTH ISSUES  . Bipolar affective disorder, mixed   . GERD (gastroesophageal reflux disease)     OCCASIONALLY TAKE  ZANTAC  . Left ovarian cyst   . Right lower quadrant abdominal pain   . Frequency of urination   . Urgency of urination   . Strains to urinate   . Nocturia   . Heart palpitations   . Cancer   . Depression   . Seizures    Past Surgical History  Procedure Laterality Date  . Wisdom tooth extraction    . Biopsy breast  10/14/2010    right needle core biopsy  . Portacath placement  11/16/2010    placement of left subclavian port  . I&d extremity  09/26/2011    Procedure: IRRIGATION AND DEBRIDEMENT EXTREMITY;  Surgeon: Tennis Must, MD;  Location: [facility];  Service: Orthopedics;  Laterality: Right;  I&D right hand cat bite wound  . Breast surgery  11/16/2010    bilateral mastectomy+ right axillary node dissection,T1cN1a, Her2+,ERPR+  (right breast invasive ductal carcinoma)  . Transthoracic echocardiogram  05-28-2012    LVF NORMAL/  EF 55-60%  . Cysto with hydrodistension N/A 07/23/2012    Procedure: CYSTOSCOPY/HYDRODISTENSION instillation of marcaine and pyridium;  Surgeon: Reece Packer, MD;  Location: [place] [facility];  Service: Urology;  Laterality: N/A;  INSTILLATION     Family History  Problem Relation Age of Onset  . Cancer Maternal Grandfather   . Mental illness Brother   . Mental illness Son    History  Substance Use Topics  . Smoking status: Current Every Day Smoker -- 1.00 packs/day  for 20 years    Types: Cigarettes  . Smokeless tobacco: Never Used  . Alcohol Use: Yes   OB History   Grav Para Term Preterm Abortions TAB SAB Ect Mult Living   _0 0 0 0 0 0 0 1     Review of Systems  Constitutional: Negative for fever, chills, diaphoresis, activity change, appetite change and fatigue.  HENT: Negative for congestion, facial swelling, hearing loss, rhinorrhea and sore throat.   Eyes: Negative for photophobia and discharge.  Respiratory: Negative for cough, chest tightness and shortness of breath.   Cardiovascular: Negative for chest pain, palpitations  and leg swelling.  Gastrointestinal: Positive for nausea. Negative for vomiting, abdominal pain and diarrhea.  Endocrine: Negative for polydipsia and polyuria.  Genitourinary: Negative for dysuria, frequency, difficulty urinating and pelvic pain.  Musculoskeletal: Negative for arthralgias, back pain, neck pain and neck stiffness.  Skin: Negative for color change and wound.  Allergic/Immunologic: Negative for immunocompromised state.  Neurological: Positive for headaches. Negative for focal weakness, facial asymmetry, weakness and numbness.  Hematological: Does not bruise/bleed easily.  Psychiatric/Behavioral: Negative for confusion and agitation.    Allergies  Tramadol; Phenytoin; Thorazine; Ciprofloxacin; and Vancomycin  Home Medications   Current Outpatient Rx  Name  Route  Sig  Dispense  Refill  . acetaminophen (TYLENOL) 500 MG tablet   Oral   Take 1,000 mg by mouth every 8 (eight) hours as needed for pain.         Marland Kitchen ALPRAZolam (XANAX) 1 MG tablet      Take 1 three times a day for anxiety.   90 tablet   4   . carvedilol (COREG) 6.25 MG tablet      For heart palpitations (rapid heart beat).  Take 1 tablet in the morning and 1 and 1/2 tabs in the evening   75 tablet   11   . citalopram (CELEXA) 20 MG tablet      TAKE TWO TABLETS BY MOUTH DAILY. take one tablet a day then may increase to 2 tablets after one week   60 tablet   3   . gabapentin (NEURONTIN) 300 MG capsule      Start dose at 372m TID and in one week may increase to 2 tabs TID   180 capsule   3   . goserelin (ZOLADEX) 10.8 MG injection   Subcutaneous   Inject 10.8 mg into the skin every 3 (three) months.         .Marland KitchenHYDROcodone-acetaminophen (NORCO/VICODIN) 5-325 MG per tablet   Oral   Take 1 tablet by mouth every 8 (eight) hours as needed.   6 tablet   0   . ranitidine (ZANTAC) 150 MG tablet   Oral   Take 150 mg by mouth 2 (two) times daily as needed for heartburn.         . warfarin  (COUMADIN) 5 MG tablet   Oral   Take 5-7.5 mg by mouth daily. Every other day (Monday Wednesday Friday Sunday) take 1.5 tablets (7.522mtotal) and the other days (Tuesday Thursday Saturday) take 67m567mone tablet)          There were no vitals taken for this visit. Physical Exam  Constitutional: She is oriented to person, place, and time. She appears well-developed and well-nourished. No distress.  HENT:  Head: Normocephalic and atraumatic.  Mouth/Throat: No oropharyngeal exudate.  Eyes: Pupils are equal, round, and reactive to light.  Neck: Normal range of motion. Neck supple.  Cardiovascular: Normal rate, regular rhythm and normal heart sounds.  Exam reveals no gallop and no friction rub.   No murmur heard. Pulmonary/Chest: Effort normal and breath sounds normal. No respiratory distress. She has no wheezes. She has no rales.  Abdominal: Soft. Bowel sounds are normal. She exhibits no distension and no mass. There is no tenderness. There is no rebound and no guarding.  Musculoskeletal: Normal range of motion. She exhibits no edema and no tenderness.  Neurological: She is alert and oriented to person, place, and time. She has normal strength. She displays no tremor. No cranial nerve deficit or sensory deficit. She exhibits normal muscle tone. She displays a negative Romberg sign. Coordination and gait normal. GCS eye subscore is 4. GCS verbal subscore is 5. GCS motor subscore is 6.  Skin: Skin is warm and dry.  Psychiatric: She has a normal mood and affect.    ED Course  Procedures (including critical care time) Labs Review Labs Reviewed - No data to display Imaging Review No results found.  EKG Interpretation   None       MDM   1. Migraines    Pt is a 38 y.o. female with Pmhx as above who presents with chronic h/a's, worse since switching chemotherapeutic agents recently which she reports was expected per her oncologist.  She saw her PCP yesterday who would like her to f/u with  her neurologist, but pt states she doesn't have the money.  H/a today generalized, w/ assoc photo/phonophobia.  No focal neuro complaints. No recent fevers.  On PE, VSS, pt in NAD, no focal neuro findings on PE.  Migraine cocktail offered, does not want IV meds and does not want benadryl.  WIll given IM reglan, & decadron.  She   17:35 Pm Pt feeling not better, interested in trying another agent.  Does not want mag or depakote.  I'll try 2.5IV haldol.   18:13PM Pt now states she does not want Haldol and just wants to go home. As I doubt acute intracranial bleed, CVA, meningitis, I feel she is safe to continue supportive care at home.  I have strongly suggested she f/u with her neurologist  Neta Ehlers, MD 05/21/13 Vernelle Emerald

## 2013-05-21 NOTE — Progress Notes (Addendum)
ID: Crystal [name]   DOB: 12/18/1975  MR#: [id number]  YKD#:[id number]  LZJ:[id number], MD GYN: SULorine Bears, MD OTHER:  Elmarie Mainland   CHIEF COMPLAINT:  Hx of Right Breast Cancer   HISTORY OF PRESENT ILLNESS: The patient is a 38 year old [name] who noted a mass in her right breast and brought it to her primary care physician's attention June of 2012. She was set up for mammography at [name], where an area of calcifications highly suspicious for carcinoma was biopsied. This was in the upper outer quadrant and it measured 1.7 cm mammographically and 1.8 cm by ultrasonography. The pathology report ([id number]) showed a high-grade invasive ductal carcinoma estrogen receptor positive at 64% progesterone receptor positive at 18%, and HER-2 amplified with a ratio by CISH of 5.24. MIB-1-1 was 65%. A second mass biopsied at the same time was also triple positive.  Given her multiple masses in the right breast, mastectomy was recommended. The patient actually opted for bilateral mastectomies and this was performed together with a right axillary lymph node dissection under Dr. Star Age on 11/16/2010. The final pathology [id number]) showed, on the left, no evidence of cancer. On the right the patient had a high-grade invasive ductal carcinoma measuring 1.9 cm, with negative margins, grade 3, involving 2 of 16 lymph nodes (stage IIB).  Her subsequent history is as detailed below.  INTERVAL HISTORY:  Crystal [name] returns alone today for followup of herright  breast cancer. She continues to receive does run injections every 3 months, last given 04/29/2013 and due again in March. She was started on anastrozole in November, but is having multiple side effects which will be discussed below. She also continues on therapeutic Coumadin for history of upper Chairman the DVT, and is followed by our Coumadin clinic.  Crystal [name] was very tearful and anxious during our visit today. She tells me  she is "just so tired" with extreme fatigue. She's having terrible hot flashes, and has no relief from the gabapentin which she has now discontinued. She feels miserable. She's having increased headaches. Her fibromyalgia pain has worsened. She does believe that there have been significant increases in the hot flashes, headaches, and joint pain since starting the anastrozole.  Recall that Crystal [name] has a history of seizures. She has a history of migraines and tells me that he has "tried everything" and that hydrocodone/APAP is the "only thing that works". She was given a prescription by her primary care physician for hydrocodone/APAP yesterday, only a few tablets for her migraines. She tells me that have tried all of the "migraine medications" such as Imitrex and that "none of those work. She is adamant that she needs to hydrocodone/APAP. She continues on Celexa, so of course cannot take the tramadol. She's taking Tylenol at home with only minimal relief.  We had recommended that Crystal [name] be seen by the pain clinic, and also by her neurologist, but she tells me she doesn't have insurance, "can't get Medicaid", and "can't afford those visits".  REVIEW OF SYSTEMS: Crystal [name] had a recent "virus" but this has resolved and she's had no fevers or chills over the past couple of weeks. She does have night sweats associated with the hot flashes. She denies any cough, phlegm production, shortness of breath, chest pain, or palpitations. She's had no peripheral swelling. She's eating and drinking fairly well and has no nausea or change in bowel habits. She does have some reflux. She bruises easily but has had no abnormal bleeding. She's had  no rashes or skin changes. She continues to have pain "throughout her body" which she describes as stabbing, throbbing, aching, and cramping. She feels forgetful. She feels depressed. She denies suicidal ideation and declines support services. In summary, her review of systems is generally  positive.   A detailed review of systems is otherwise noncontributory.   PAST MEDICAL HISTORY: Past Medical History  Diagnosis Date  . Status post chemotherapy     docetaxel/carboplatin/trastuzumab  . Anxiety   . Hx MRSA infection 03/28/11    Skin  . History of breast cancer DX JUNE 2012 W/ RIGHT BREAST INVASIVE DUCTAL CA  STAGE IIB---  S/P BILATERAL MASTECTOMIES AND RIGHT NODE DISSECTION    ONCOLOGIST-- DR Jana Hakim--  CHEMO ENDED [place] 2012 / RADIATION ENDED MAR 2013--  CURRENT ON TAMOXIFEN AND ZOLADEX  . Family history of anesthesia complication     MOTHER PONV  . Hot flashes due to tamoxifen   . History of idiopathic seizure     DX 2008 --  LAST ONE 2012--  NO ISSUES SINCE AND NO MEDS. -- PT STATES DOCTORS FELT IT WAS STRESS RELATED DUE TO HEALTH ISSUES  . Bipolar affective disorder, mixed   . GERD (gastroesophageal reflux disease)     OCCASIONALLY TAKE ZANTAC  . Left ovarian cyst   . Right lower quadrant abdominal pain   . Frequency of urination   . Urgency of urination   . Strains to urinate   . Nocturia   . Heart palpitations   . Cancer   . Depression   . Seizures   1. History of seizures. The patient has been thoroughly evaluated both here by Dr. Gaynell Face and at [facility] for this. Among many studies, she had an MRI of the brain on August 28th. This showed an incidental solitary small lesion in the left frontoparietal white matter. It was not felt to be suggestive of multiple sclerosis. A noncontrasted CT in August 2010 was negative. The patient has been off all seizure medications now for 2 months. There has been no evidence of seizure recurrence. 2. Complex psychology history (please refer to Dr. Iona Coach note in the E-chart from August 29, 2009) with evidence of anxiety disorder, depression and psychosis. The patient is currently on no medications for this and denies any of these symptoms at present. 3. History of migraines. 4. Remote history of multidrug abuse. 5. Status  post wisdom teeth extraction.         History of MRSA skin infection   PAST SURGICAL HISTORY: Past Surgical History  Procedure Laterality Date  . Wisdom tooth extraction    . Biopsy breast  10/14/2010    right needle core biopsy  . Portacath placement  11/16/2010    placement of left subclavian port  . I&d extremity  09/26/2011    Procedure: IRRIGATION AND DEBRIDEMENT EXTREMITY;  Surgeon: Tennis Must, MD;  Location: [facility];  Service: Orthopedics;  Laterality: Right;  I&D right hand cat bite wound  . Breast surgery  11/16/2010    bilateral mastectomy+ right axillary node dissection,T1cN1a, Her2+,ERPR+  (right breast invasive ductal carcinoma)  . Transthoracic echocardiogram  05-28-2012    LVF NORMAL/  EF 55-60%  . Cysto with hydrodistension N/A 07/23/2012    Procedure: CYSTOSCOPY/HYDRODISTENSION instillation of marcaine and pyridium;  Surgeon: Reece Packer, MD;  Location: [place];  Service: Urology;  Laterality: N/A;  INSTILLATION      FAMILY HISTORY Family History  Problem Relation Age of Onset  .  Cancer Maternal Grandfather   . Mental illness Brother   . Mental illness Son   The patient's mother is living, in her mid 55s, with no history of breast or ovarian cancer. The patient does not know her biological father. The patient has an older brother with bipolar disease, and according to the patient, borderline schizophrenia.   GYNECOLOGIC HISTORY: (Updated January 2015) Menarche at age 54. She is G1, P1 with first pregnancy to term at age 13. She was not menopausal, and was still having regular periods at the time of her breast cancer diagnosis. She continues on every three-month goserelin.  SOCIAL HISTORY:  (Updated 05/21/2013) She used to work in a [employer] but had to leave that job because of the seizure problem. She's currently unemployed. Her husband , Glendell Docker, works as a Development worker, international aid. Tava has a son, Crystal [name], who is 54 and recently moved in with  his father. Crystal [name], according to the patient, has significant nausea problems. The patient attends a local [employer]. Peggyann Juba is pastor.    ADVANCED DIRECTIVES: Not in place  HEALTH MAINTENANCE:  (Updated 05/21/2013) History  Substance Use Topics  . Smoking status: Current Every Day Smoker -- 1.00 packs/day for 20 years    Types: Cigarettes  . Smokeless tobacco: Never Used  . Alcohol Use: Yes     Colonoscopy: Never  PAP: July 2012  Bone density:  Never  Lipid panel: Not on file  Allergies  Allergen Reactions  . Tramadol Other (See Comments)    Seizures  . Phenytoin Nausea And Vomiting  . Thorazine [Chlorpromazine Hcl] Hives  . Ciprofloxacin Nausea And Vomiting  . Vancomycin Rash    Possible rash reported by MD    Current Outpatient Prescriptions  Medication Sig Dispense Refill  . acetaminophen (TYLENOL) 500 MG tablet Take 1,000 mg by mouth every 8 (eight) hours as needed for pain.      Marland Kitchen ALPRAZolam (XANAX) 1 MG tablet Take 1 three times a day for anxiety.  90 tablet  4  . citalopram (CELEXA) 20 MG tablet TAKE TWO TABLETS BY MOUTH DAILY. take one tablet a day then may increase to 2 tablets after one week  60 tablet  3  . gabapentin (NEURONTIN) 300 MG capsule Start dose at $Remov'300mg'edpPoQ$  TID and in one week may increase to 2 tabs TID  180 capsule  3  . goserelin (ZOLADEX) 10.8 MG injection Inject 10.8 mg into the skin every 3 (three) months.      . ranitidine (ZANTAC) 150 MG tablet Take 150 mg by mouth 2 (two) times daily as needed for heartburn.      . warfarin (COUMADIN) 5 MG tablet Take 5-7.5 mg by mouth daily. Every other day (Monday Wednesday Friday Sunday) take 1.5 tablets (7.$RemoveBefore'5mg'OWiYXMKSDtmFD$  total) and the other days (Tuesday Thursday Saturday) take $RemoveBefore'5mg'iBRzPfrjiwDnb$  (one tablet)      . carvedilol (COREG) 6.25 MG tablet For heart palpitations (rapid heart beat).  Take 1 tablet in the morning and 1 and 1/2 tabs in the evening  75 tablet  11  . HYDROcodone-acetaminophen (NORCO/VICODIN) 5-325 MG per  tablet Take 1 tablet by mouth every 8 (eight) hours as needed.  6 tablet  0   No current facility-administered medications for this visit.   OBJECTIVE: Young white female who appears tearful and anxious Filed Vitals:   05/21/13 1153  BP: 109/77  Pulse: 87  Temp: 97.8 F (36.6 C)  Resp: 98  ECOG: 2 Body mass index is 23.61 kg/(m^2). Filed  Weights   05/21/13 1153  Weight: 146 lb 3.2 oz (66.316 kg)   Physical Exam: HEENT:  Sclerae anicteric.  Oropharynx clear and moist. Neck supple, trachea midline  NODES:  No cervical or supraclavicular lymphadenopathy palpated.  BREAST EXAM:  Patient is status post bilateral mastectomies. Axillae are benign bilaterally, with no palpable lymphadenopathy. LUNGS:  Clear to auscultation bilaterally.  No wheezes or rhonchi HEART:  Regular rate and rhythm. No murmur  ABDOMEN:  Soft, nondistended, nontender.  Positive bowel sounds.  MSK:  No focal spinal tenderness to palpation. Good range of motion bilaterally in the upper extremities. EXTREMITIES:  No peripheral edema.   SKIN:  Benign with no visible rashes or skin lesions. No pallor. No visible ecchymoses or petechiae.  NEURO:  Nonfocal. Well oriented.  Volatile affect.     LAB RESULTS: Lab Results  Component Value Date   WBC 7.5 05/21/2013   NEUTROABS 4.0 05/21/2013   HGB 14.7 05/21/2013   HCT 42.3 05/21/2013   MCV 100.0 05/21/2013   PLT 222 05/21/2013      Chemistry      Component Value Date/Time   NA 143 05/21/2013 1056   NA 138 08/22/2012 1700   K 4.5 05/21/2013 1056   K 3.7 08/22/2012 1700   CL 105 10/11/2012 1441   CL 103 08/22/2012 1700   CO2 28 05/21/2013 1056   CO2 23 08/22/2012 1700   BUN 16.3 05/21/2013 1056   BUN 11 08/22/2012 1700   CREATININE 0.9 05/21/2013 1056   CREATININE 0.96 08/22/2012 1700      Component Value Date/Time   CALCIUM 10.0 05/21/2013 1056   CALCIUM 9.1 08/22/2012 1700   ALKPHOS 77 05/21/2013 1056   ALKPHOS 87 08/22/2012 1700   AST 43* 05/21/2013 1056   AST 33  08/22/2012 1700   ALT 39 05/21/2013 1056   ALT 24 08/22/2012 1700   BILITOT 0.42 05/21/2013 1056   BILITOT 0.2* 08/22/2012 1700         STUDIES:  Mr Crystal [name] PY Contrast  01/16/2013   *RADIOLOGY REPORT*  Clinical Data:  Speech disturbance.  Gait disturbance.  Numbness and weakness.  History breast cancer.  MRI HEAD WITHOUT AND WITH CONTRAST MRI CERVICAL SPINE WITHOUT AND WITH CONTRAST  Technique:  Multiplanar, multiecho pulse sequences of the brain and surrounding structures, and cervical spine, to include the craniocervical junction and cervicothoracic junction, were obtained without and with intravenous contrast.  Contrast: 29mL MULTIHANCE GADOBENATE DIMEGLUMINE 529 MG/ML IV SOLN  Comparison:  07/14/2011  MRI HEAD  Findings:  Diffusion imaging does not show any acute or subacute infarction.  Again demonstrated is abnormal signal throughout the pons.  This could relate to post treatment effect. A few foci of T2 and FLAIR signal in the left frontal white matter are unchanged. No evidence of mass lesion, hemorrhage, hydrocephalus or extra- axial collection.  After contrast administration, no abnormal enhancement occurs.  No pituitary mass.  No inflammatory sinus disease.  No skull or skull base lesion.  IMPRESSION: No evidence of metastatic disease.  Chronic abnormal T2 signal within the pons, probably related to previous treatment.  MRI CERVICAL SPINE  Findings: Alignment is normal.  There is no evidence of metastatic disease in the region.  The foramen magnum is widely patent.  C1-2, C2-3, C3-4 and C4-5 are normal.  C5-6:  Bulging of the disc but no stenosis or neural compression.  C6-7:  Spondylosis with endplate osteophytes and shallow disc herniation.  Narrowing of ventral subarachnoid space  but no compressive effect upon the cord.  Mild foraminal narrowing without gross neural compression.  C7-T1:  Normal.  No abnormal cord signal.  No abnormal contrast enhancement.  IMPRESSION: No evidence of  metastatic disease.  Mild degenerative disease at C5- 6 and C6-7 without gross neural compression.   Original Report Authenticated By: Crystal [name], M.D.      ASSESSMENT: 38 y.o.  [place] woman, known to be BRCA1 and 2 negative,   (1) status post bilateral mastectomies May of 2012 for a right-sided T1c N1 (stage II) grade 3 invasive ductal carcinoma,estrogen and progesterone receptor positive, HER-2 amplified, with an MIB-1-1 of 85%.   (2) status post carboplatin/docetaxol/trastuzumab x6, completed mid December 2012   (3) on trastuzumab every 3 weeks, started August of 2012, interrupted between April and August of 2013, completed 04/27/2012. Most recent echo 03/19/2012  (4) status post radiation therapy, completed 07/12/2011  (5) On tamoxifen since April 2013, held as of 12/27/2012 with the diagnosis of a left upper extremity DVT.  On therapeutic Coumadin since that time.   (6) On goserelin given every 3 months, 1st injection on 10/26/11, most recent dose 04/29/2013.   (7) started on anastrozole November 2014  Other problems:  (1) Chronic Pain/ fibromyalgia  (2) history of seizure disorder    PLAN:  I spent over an hour with Crystal [name] today, the majority of which was spent in addressing her many concerns as detailed above, discussing possible options for care, and coordinating appointments.   This case was reviewed with Dr. Jana Hakim who also spoke with the patient today.   I have asked her to discontinue the anastrozole. I would like to give her 4-6 weeks off of the medication to see if any of these symptoms, especially the hot flashes, headaches and joint pain, improved. She would then see Dr. Jana Hakim in early March to reassess her treatment plan. At that time, she will also has been on anti-coagulation therapy for 6 months for DVT (diagnosed in late August 2014).  We have again explained to Crystal [name] that, in the absence of active cancer, we simply cannot prescribe any additional  narcotic medication. As noted above, she cannot take tramadol due to being on Celexa, and also due to her history of seizures. We discussed the possibility of starting her on Fioricet for the headaches, but upon further review with our pharmacist, I had to cancel that prescription as it has a possible interaction with her Coumadin. (Our pharmacist, Crystal [name], obtained the paper prescription from [facility] and this has been destroyed.)  We are really unsure what else to offer Crystal [name] with regards to her headaches and pain. She is extremely distraught here in the office today and says she "needs help and now".  Again, Crystal [name] tells [place] that she cannot afford to see either her neurologist or the pain clinic.  After much discussion, she has agreed to proceed to the emergency room for immediate assistance with the above issues.  Dr. Jana Hakim was involved in this discussion, and in fact took Crystal [name] to the emergency room himself this afternoon.   Crystal Dreese PA-C    05/21/2013    ADDENDUM: Arian was in uncontrolled pain today, due to fibromyalgia and migraines. Emotionally she was very unstable and there were veiled threats of harm to herself if she did not get the pain medication she felt she needed. This was a difficult situation as Lul[place]e does not have active cancer and there is a history suggestive of drug seeking behavior. I felt  very uncomfortable and after discussing the options as best I could with [place] the best choice seemed to be for her to be evaluated in the ED for either medical or behavioral health admission. I discussed the situation in detail with the MD in charge.  I personally saw this patient and performed a substantive portion of this encounter with the listed APP documented above.   Chauncey Cruel, MD

## 2013-05-21 NOTE — Discharge Instructions (Signed)
Migraine Headache A migraine headache is an intense, throbbing pain on one or both sides of your head. A migraine can last for 30 minutes to several hours. CAUSES  The exact cause of a migraine headache is not always known. However, a migraine may be caused when nerves in the brain become irritated and release chemicals that cause inflammation. This causes pain. Certain things may also trigger migraines, such as:  Alcohol.  Smoking.  Stress.  Menstruation.  Aged cheeses.  Foods or drinks that contain nitrates, glutamate, aspartame, or tyramine.  Lack of sleep.  Chocolate.  Caffeine.  Hunger.  Physical exertion.  Fatigue.  Medicines used to treat chest pain (nitroglycerine), birth control pills, estrogen, and some blood pressure medicines. SIGNS AND SYMPTOMS  Pain on one or both sides of your head.  Pulsating or throbbing pain.  Severe pain that prevents daily activities.  Pain that is aggravated by any physical activity.  Nausea, vomiting, or both.  Dizziness.  Pain with exposure to bright lights, loud noises, or activity.  General sensitivity to bright lights, loud noises, or smells. Before you get a migraine, you may get warning signs that a migraine is coming (aura). An aura may include:  Seeing flashing lights.  Seeing bright spots, halos, or zig-zag lines.  Having tunnel vision or blurred vision.  Having feelings of numbness or tingling.  Having trouble talking.  Having muscle weakness. DIAGNOSIS  A migraine headache is often diagnosed based on:  Symptoms.  Physical exam.  A CT scan or MRI of your head. These imaging tests cannot diagnose migraines, but they can help rule out other causes of headaches. TREATMENT Medicines may be given for pain and nausea. Medicines can also be given to help prevent recurrent migraines.  HOME CARE INSTRUCTIONS  Only take over-the-counter or prescription medicines for pain or discomfort as directed by your  health care provider. The use of long-term narcotics is not recommended.  Lie down in a dark, quiet room when you have a migraine.  Keep a journal to find out what may trigger your migraine headaches. For example, write down:  What you eat and drink.  How much sleep you get.  Any change to your diet or medicines.  Limit alcohol consumption.  Quit smoking if you smoke.  Get 7 9 hours of sleep, or as recommended by your health care provider.  Limit stress.  Keep lights dim if bright lights bother you and make your migraines worse. SEEK IMMEDIATE MEDICAL CARE IF:   Your migraine becomes severe.  You have a fever.  You have a stiff neck.  You have vision loss.  You have muscular weakness or loss of muscle control.  You start losing your balance or have trouble walking.  You feel faint or pass out.  You have severe symptoms that are different from your first symptoms. MAKE SURE YOU:   Understand these instructions.  Will watch your condition.  Will get help right away if you are not doing well or get worse. Document Released: 04/18/2005 Document Revised: 02/06/2013 Document Reviewed: 12/24/2012 Exit[employer] Patient Information 2014 [employer].  

## 2013-05-22 ENCOUNTER — Telehealth: Payer: Self-pay | Admitting: Oncology

## 2013-05-22 NOTE — Telephone Encounter (Signed)
s/w pt re lb/fu 3/3 @ 1pm

## 2013-06-04 ENCOUNTER — Other Ambulatory Visit (HOSPITAL_BASED_OUTPATIENT_CLINIC_OR_DEPARTMENT_OTHER): Payer: Self-pay

## 2013-06-04 ENCOUNTER — Ambulatory Visit (HOSPITAL_BASED_OUTPATIENT_CLINIC_OR_DEPARTMENT_OTHER): Payer: Self-pay | Admitting: Pharmacist

## 2013-06-04 DIAGNOSIS — R600 Localized edema: Secondary | ICD-10-CM

## 2013-06-04 DIAGNOSIS — C50911 Malignant neoplasm of unspecified site of right female breast: Secondary | ICD-10-CM

## 2013-06-04 DIAGNOSIS — I82409 Acute embolism and thrombosis of unspecified deep veins of unspecified lower extremity: Secondary | ICD-10-CM

## 2013-06-04 DIAGNOSIS — M792 Neuralgia and neuritis, unspecified: Secondary | ICD-10-CM

## 2013-06-04 LAB — POCT INR: INR: 1.3

## 2013-06-04 LAB — PROTIME-INR
INR: 1.3 — ABNORMAL LOW (ref 2.00–3.50)
Protime: 15.6 Seconds — ABNORMAL HIGH (ref 10.6–13.4)

## 2013-06-04 NOTE — Progress Notes (Signed)
INR below goal Pt seen today in coumadin clinic. Her only complaint is continued headaches Pt reports no missed doses or extra doses since last visit And no alcohol use in the last 5 days Pt states she is planning to stop drinking alcohol ("or drink minimally on rare occassions") and to begin eating healthier.  Pt may start increasing the amount of salads in her diet that may require increases in her coumadin dose Will be conservative with this as patient has had fluctuations in her INR in the past due to alcohol consumption Plan for now: Change to coumadin 7.5 mg daily except for 5 mg on Mondays and Fridays Recheck INR on Tuesday 06/18/13 at 11:15 am for lab, 11:30 m for coumadin clinic.

## 2013-06-04 NOTE — Patient Instructions (Signed)
INR below goal Change to coumadin 7.5 mg daily except for 5 mg on Mondays and Fridays Recheck INR on Tuesday 06/18/13 at 11:15 am for lab, 11:30 m for coumadin clinic.

## 2013-06-07 ENCOUNTER — Telehealth: Payer: Self-pay | Admitting: Pharmacist

## 2013-06-07 ENCOUNTER — Encounter: Payer: Self-pay | Admitting: Diagnostic Neuroimaging

## 2013-06-07 ENCOUNTER — Encounter (INDEPENDENT_AMBULATORY_CARE_PROVIDER_SITE_OTHER): Payer: Self-pay

## 2013-06-07 ENCOUNTER — Ambulatory Visit (INDEPENDENT_AMBULATORY_CARE_PROVIDER_SITE_OTHER): Payer: Self-pay | Admitting: Diagnostic Neuroimaging

## 2013-06-07 ENCOUNTER — Other Ambulatory Visit: Payer: Self-pay | Admitting: Physician Assistant

## 2013-06-07 ENCOUNTER — Telehealth: Payer: Self-pay | Admitting: Diagnostic Neuroimaging

## 2013-06-07 VITALS — BP 120/83 | HR 107 | Ht 64.5 in | Wt 145.5 lb

## 2013-06-07 DIAGNOSIS — G40909 Epilepsy, unspecified, not intractable, without status epilepticus: Secondary | ICD-10-CM

## 2013-06-07 DIAGNOSIS — C50411 Malignant neoplasm of upper-outer quadrant of right female breast: Secondary | ICD-10-CM

## 2013-06-07 DIAGNOSIS — R5383 Other fatigue: Secondary | ICD-10-CM

## 2013-06-07 DIAGNOSIS — R5381 Other malaise: Secondary | ICD-10-CM

## 2013-06-07 DIAGNOSIS — G43909 Migraine, unspecified, not intractable, without status migrainosus: Secondary | ICD-10-CM

## 2013-06-07 MED ORDER — TOPIRAMATE 50 MG PO TABS: 50.0000 mg | ORAL_TABLET | Freq: Two times a day (BID) | ORAL | Status: DC

## 2013-06-07 NOTE — Patient Instructions (Signed)
Ask coumadin clinic about starting topiramate and melatonin.  I will arrange MRI brain through oncology clinic.

## 2013-06-07 NOTE — Telephone Encounter (Addendum)
Pt called to say that she is starting Topamax for her headaches.  She questioned whether it is ok to take Topamax with warfarin. It is possible that Topamax can decrease warfarin levels (although per [facility] Drug Information there is no interaction between these 2 medications). Pt will start Topamax today and recheck INR in ~7 days (06/14/13). Last INR = 1.3 on 06/04/13 and her coumadin dose was increased on that date. Informed pt of possible interaction.

## 2013-06-07 NOTE — Addendum Note (Signed)
Addended byAndrey Spearman on: 06/07/2013 03:16 PM   Modules accepted: Orders

## 2013-06-07 NOTE — Progress Notes (Signed)
Crystal [name] was evaluated earlier today by Dr. Leta Baptist at [facility] with regards to her severe headaches. She continues to have daily headaches with photophobia and has also mentioned increased lethargy and confusion. Of course she also has a history of seizures and migraines, in addition to her breast cancer history.   Dr. Leta Baptist has suggested that we order a brain MRI with and without contrast for further evaluation. This order has been placed accordingly, hopefully to be obtained within the next couple of weeks. Crystal [name] is scheduled to return on March 3 for followup with Dr. Jana Hakim. She'll be seeing her neurologist again in approximately 3 months, May 2015.  Micah Flesher, PA-C 06/07/2013

## 2013-06-07 NOTE — Progress Notes (Signed)
[facility]  PATIENT: Crystal [name] DOB: 02/21/1976  REFERRING CLINICIAN:  HISTORY FROM: patient and husband REASON FOR VISIT: follow up   HISTORICAL  CHIEF COMPLAINT:  Chief Complaint  Patient presents with  . Follow-up    HA    HISTORY OF PRESENT ILLNESS:   UPDATE 06/07/13: Since last visit, tried gabapentin, but HA have worsened. Over last few months, daily HA, with photophobia. Using tylenol 2 tabs TID, on daily basis. Has stopped soda, but drinks 1 pot coffee between 8-noon. Poor sleep, more anxiety, more mood disturbances. Does not want to go back to psychiatry. Also with hot flashes, that interrupt sleep. No seizures or non-epileptic spells. Yesterday had morelethargy and confusion, but feels better today. Also, has started coumadin (Oct 2014) for LUE DVT.  PRIOR HPI (12/22/11): 38 year old right-handed female, with history of high-grade invasive ductal carcinoma estrogen receptor positive breast cancer, status post bilateral mastectomy, right axillary lymph node dissection, status post chemotherapy (docetaxel, carboplatin, trastuzumab), here for evaluation of headaches and seizure disorder.  Patient previously seen by Dr. Estella Husk, then Dr. Gaynell Face, for a number of years for possible seizure disorder. First seizure occurred in 2008. Apparently she had vomiting, loss of consciousness, convulsions and then confusion. She's tried on a variety of anticonvulsants but her seizures seem to get worse over time. Ultimately she was referred to [facility] video EEG monitoring, but unclear if any spells were captured. She was taken off of all of her anticonvulsant medications and she had no further events. Therefore conclusion was that these may have been nonepileptic spells. She's had no further events until the past few months which had increasing similar spells of confusion, bilateral leg weakness, falling down, confusion. One episode involved bilateral leg convulsions  without loss of consciousness.  Over the past one year she's had increasing headaches, bandlike sensation across her head sometimes over her forehead, with pounding sensation, photophobia, phonophobia nausea. She's tried ibuprofen, Tylenol, Aleve without significant relief. She tried taking her relatives gabapentin 600 mg tablet x1, which significantly helped her headaches. Aggravating factors include drinking 3-4 beers every night, reaching up to 1 L of caffeinated soda per day, and increasing stress.  REVIEW OF SYSTEMS: Full 14 system review of systems performed and notable only for decreased activity fatigue and neck pain and legs eye pain flashes palpitation nausea insomnia daytime sleepiness aching muscle joint pain passing out weakness lightheadedness headache memory loss easy bruising agitation confusion depression anxiety nervousness.  ALLERGIES: Allergies  Allergen Reactions  . Tramadol Other (See Comments)    Seizures  . Phenytoin Nausea And Vomiting  . Thorazine [Chlorpromazine Hcl] Hives  . Ciprofloxacin Nausea And Vomiting  . Vancomycin Rash    Possible rash reported by MD    HOME MEDICATIONS: Outpatient Prescriptions Prior to Visit  Medication Sig Dispense Refill  . acetaminophen (TYLENOL) 500 MG tablet Take 1,000 mg by mouth every 8 (eight) hours as needed for pain.      Marland Kitchen ALPRAZolam (XANAX) 1 MG tablet Take 1 three times a day for anxiety.  90 tablet  4  . carvedilol (COREG) 6.25 MG tablet For heart palpitations (rapid heart beat).  Take 1 tablet in the morning and 1 and 1/2 tabs in the evening  75 tablet  11  . citalopram (CELEXA) 20 MG tablet TAKE TWO TABLETS BY MOUTH DAILY. take one tablet a day then may increase to 2 tablets after one week  60 tablet  3  . goserelin (ZOLADEX) 10.8 MG  injection Inject 10.8 mg into the skin every 3 (three) months.      . ranitidine (ZANTAC) 150 MG tablet Take 150 mg by mouth 2 (two) times daily as needed for heartburn.      . warfarin  (COUMADIN) 5 MG tablet Take 5-7.5 mg by mouth daily. Mon: 48m Tue: 5.56mWed: 5.19m9mhurs: 5.19mg8mi: 19mg 58m: 7.19mg S57m 7.19mg   51m. gabapentin (NEURONTIN) 300 MG capsule Start dose at 300mg TI43md in one week may increase to 2 tabs TID  180 capsule  3  . HYDROcodone-acetaminophen (NORCO/VICODIN) 5-325 MG per tablet Take 1 tablet by mouth every 8 (eight) hours as needed.  6 tablet  0   No facility-administered medications prior to visit.    PAST MEDICAL HISTORY: Past Medical History  Diagnosis Date  . Status post chemotherapy     docetaxel/carboplatin/trastuzumab  . Anxiety   . Hx MRSA infection 03/28/11    Skin  . History of breast cancer DX JUNE 2012 W/ RIGHT BREAST INVASIVE DUCTAL CA  STAGE IIB---  S/P BILATERAL MASTECTOMIES AND RIGHT NODE DISSECTION    ONCOLOGIST-- DR MAGRINATJana HakimO ENDED DEC 2012ArispeRADIATION ENDED MAR 2013--  CURRENT ON TAMOXIFEN AND ZOLADEX  . Family history of anesthesia complication     MOTHER PONV  . Hot flashes due to tamoxifen   . History of idiopathic seizure     DX 2008 --  LAST ONE 2012--  NO ISSUES SINCE AND NO MEDS. -- PT STATES DOCTORS FELT IT WAS STRESS RELATED DUE TO HEALTH ISSUES  . Bipolar affective disorder, mixed   . GERD (gastroesophageal reflux disease)     OCCASIONALLY TAKE ZANTAC  . Left ovarian cyst   . Right lower quadrant abdominal pain   . Frequency of urination   . Urgency of urination   . Strains to urinate   . Nocturia   . Heart palpitations   . Cancer   . Depression   . Seizures     PAST SURGICAL HISTORY: Past Surgical History  Procedure Laterality Date  . Wisdom tooth extraction    . Biopsy breast  10/14/2010    right needle core biopsy  . Portacath placement  11/16/2010    placement of left subclavian port  . I&d extremity  09/26/2011    Procedure: IRRIGATION AND DEBRIDEMENT EXTREMITY;  Surgeon: Kevin R Tennis Mustocation: [facility];  Service: Orthopedics;  Laterality: Right;  I&D right hand cat bite wound  .  Breast surgery  11/16/2010    bilateral mastectomy+ right axillary node dissection,T1cN1a, Her2+,ERPR+  (right breast invasive ductal carcinoma)  . Transthoracic echocardiogram  05-28-2012    LVF NORMAL/  EF 55-60%  . Cysto with hydrodistension N/A 07/23/2012    Procedure: CYSTOSCOPY/HYDRODISTENSION instillation of marcaine and pyridium;  Surgeon: Scott A Reece Packerocation: [place]: Urology;  Laterality: N/A;  INSTILLATION      FAMILY HISTORY: Family History  Problem Relation Age of Onset  . Cancer Maternal Grandfather   . Mental illness Brother   . Mental illness Son     SOCIAL HISTORY:  History   Social History  . Marital Status: Married    Spouse Name: Carl    Glendell Dockerer of Children: 1  . Years of Education: HS   Occupational History  .     Social History Main Topics  . Smoking status: Current Every Day Smoker -- 1.00 packs/day for 20 years  Types: Cigarettes  . Smokeless tobacco: Never Used  . Alcohol Use: Yes  . Drug Use: No     Comment: LAST USED COCAINE AND [place] 2011  . Sexual Activity: Yes    Partners: Male    Birth Control/ Protection: Injection     Comment: "zylodex" to "shut down ovaries"   Other Topics Concern  . Not on file   Social History Narrative   Married 4 years   1 son age 44   Unemployed      Grandfather died unknown malignacy   No family h/o breast, ovarian or colon ca      Menarche age 59      Patient lives at home with spouse.   Caffeine Use: Up to 1 pot of coffee daily                    PHYSICAL EXAM  Filed Vitals:   06/07/13 0828  BP: 120/83  Pulse: 107  Height: 5' 4.5" (1.638 m)  Weight: 145 lb 8 oz (65.998 kg)    Not recorded    Body mass index is 24.6 kg/(m^2).  GENERAL EXAM: Patient is in no distress; well developed, nourished and groomed; neck is supple  CARDIOVASCULAR: Regular rate and rhythm, no murmurs, no carotid bruits  NEUROLOGIC: MENTAL STATUS: awake,  alert, oriented to person, place and time, recent and remote memory intact, normal attention and concentration, language fluent, comprehension intact, naming intact, fund of knowledge appropriate CRANIAL NERVE: no papilledema on fundoscopic exam, pupils equal and reactive to light, PHOTOPHOBIA, visual fields full to confrontation, extraocular muscles intact, no nystagmus, facial sensation and strength symmetric, hearing intact, palate elevates symmetrically, uvula midline, shoulder shrug symmetric, tongue midline. MOTOR: POSTURAL AND ACTION TREMOR OF BUE. Normal bulk and tone, full strength in the BUE, BLE SENSORY: normal and symmetric to light touch COORDINATION: finger-nose-finger, fine finger movements, heel-shin normal REFLEXES: deep tendon reflexes present and symmetric GAIT/STATION: narrow based gait; romberg is negative    DIAGNOSTIC DATA (LABS, IMAGING, TESTING) - I reviewed patient records, labs, notes, testing and imaging myself where available.  Lab Results  Component Value Date   WBC 7.5 05/21/2013   HGB 14.7 05/21/2013   HCT 42.3 05/21/2013   MCV 100.0 05/21/2013   PLT 222 05/21/2013      Component Value Date/Time   NA 143 05/21/2013 1056   NA 138 08/22/2012 1700   K 4.5 05/21/2013 1056   K 3.7 08/22/2012 1700   CL 105 10/11/2012 1441   CL 103 08/22/2012 1700   CO2 28 05/21/2013 1056   CO2 23 08/22/2012 1700   GLUCOSE 120 05/21/2013 1056   GLUCOSE 97 10/11/2012 1441   GLUCOSE 89 08/22/2012 1700   BUN 16.3 05/21/2013 1056   BUN 11 08/22/2012 1700   CREATININE 0.9 05/21/2013 1056   CREATININE 0.96 08/22/2012 1700   CALCIUM 10.0 05/21/2013 1056   CALCIUM 9.1 08/22/2012 1700   PROT 7.0 05/21/2013 1056   PROT 6.3 08/22/2012 1700   ALBUMIN 4.2 05/21/2013 1056   ALBUMIN 3.6 08/22/2012 1700   AST 43* 05/21/2013 1056   AST 33 08/22/2012 1700   ALT 39 05/21/2013 1056   ALT 24 08/22/2012 1700   ALKPHOS 77 05/21/2013 1056   ALKPHOS 87 08/22/2012 1700   BILITOT 0.42 05/21/2013 1056   BILITOT 0.2*  08/22/2012 1700   GFRNONAA 76* 08/22/2012 1700   GFRAA 88* 08/22/2012 1700   No results found for this basename: CHOL, HDL, LDLCALC,  LDLDIRECT, TRIG, CHOLHDL   No results found for this basename: HGBA1C   No results found for this basename: VITAMINB12   Lab Results  Component Value Date   TSH 1.006 01/09/2013    I reviewed images myself and agree with interpretation. -VRP  01/16/13 MRI BRAIN - No evidence of metastatic disease. Chronic abnormal T2 signal within the pons, probably related to previous treatment.  01/16/13 MRI CERVICAL - No evidence of metastatic disease. Mild degenerative disease at C5-6 and C6-7 without gross neural compression.   ASSESSMENT AND PLAN  38 y.o. year old female here with increasing migraine and tension headaches. Also with significant aggravating factors (insomnia, anxiety, bipolar, analgesic overuse).  PLAN: - consider topiramate for migraine prevention (patient will coordinate with anti-coagulation clinic) - consider psychiatry/psychology evaluation for mood stabilization and insomnia treatment - MRI brain (with and without) --> will ask oncology clinic to arrange, due to patient's financial situation   Return in about 3 months (around 09/04/2013).    Penni Bombard, MD 05/07/6058, 0:45 AM Certified in Neurology, Neurophysiology and Neuroimaging  [facility] [address], [place], [place] [address] [phone] Joden Bonsall.

## 2013-06-14 ENCOUNTER — Encounter (INDEPENDENT_AMBULATORY_CARE_PROVIDER_SITE_OTHER): Payer: Self-pay

## 2013-06-14 ENCOUNTER — Other Ambulatory Visit (HOSPITAL_BASED_OUTPATIENT_CLINIC_OR_DEPARTMENT_OTHER): Payer: Self-pay

## 2013-06-14 ENCOUNTER — Ambulatory Visit (HOSPITAL_BASED_OUTPATIENT_CLINIC_OR_DEPARTMENT_OTHER): Payer: Self-pay | Admitting: Pharmacist

## 2013-06-14 DIAGNOSIS — R600 Localized edema: Secondary | ICD-10-CM

## 2013-06-14 DIAGNOSIS — I82409 Acute embolism and thrombosis of unspecified deep veins of unspecified lower extremity: Secondary | ICD-10-CM

## 2013-06-14 DIAGNOSIS — C50911 Malignant neoplasm of unspecified site of right female breast: Secondary | ICD-10-CM

## 2013-06-14 DIAGNOSIS — M792 Neuralgia and neuritis, unspecified: Secondary | ICD-10-CM

## 2013-06-14 LAB — PROTIME-INR
INR: 1.2 — ABNORMAL LOW (ref 2.00–3.50)
Protime: 14.4 Seconds — ABNORMAL HIGH (ref 10.6–13.4)

## 2013-06-14 LAB — POCT INR: INR: 1.2

## 2013-06-14 MED ORDER — WARFARIN SODIUM 5 MG PO TABS: ORAL_TABLET | ORAL | Status: DC

## 2013-06-14 NOTE — Patient Instructions (Addendum)
INR below goal Change to coumadin 7.5 mg daily except for 10 mg on Mondays and Fridays Recheck INR on Tuesday 07/02/13 at 1pm for lab, 1:30pm  With Dr. Jana Hakim and 2:15pm for coumadin clinic.

## 2013-06-14 NOTE — Progress Notes (Signed)
INR below goal Pt is doing well today She recently started on Topamax It is possible that Topamax can decrease warfarin levels (although per [facility] Drug Information there is no interaction between these 2 medications). She states this helps her headaches She is scheduled for an MRI of her head on Monday 06/17/13 Pt also states she missed at least 1 dose this week She has been drinking alcohol again with 1 glass of wine last night and a "couple of beers through out the week" Other than this patient has been trying to eat healthier including salads etc.  Unsure if low INR is due to topamax interaction or missed doses or diet? Also worried about increasing her dose too much with her risk of alcohol use. So will be cautious with dose adjustments Plan: Change to coumadin 7.5 mg daily except for 10 mg on Mondays and Fridays Recheck INR on Tuesday 07/02/13 at 1pm for lab, 1:30pm  With Dr. Jana Hakim and 2:15pm for coumadin clinic. Will follow plan for anticoagulation with Dr. Jana Hakim after this appointment as patient may be completing coumadin therapy at this appointment

## 2013-06-17 ENCOUNTER — Ambulatory Visit (HOSPITAL_COMMUNITY): Admission: RE | Admit: 2013-06-17 | Payer: Self-pay | Source: Ambulatory Visit

## 2013-06-17 NOTE — Telephone Encounter (Signed)
done

## 2013-06-18 ENCOUNTER — Ambulatory Visit: Payer: Self-pay

## 2013-06-18 ENCOUNTER — Other Ambulatory Visit: Payer: Self-pay

## 2013-06-21 ENCOUNTER — Ambulatory Visit (HOSPITAL_COMMUNITY): Admission: RE | Admit: 2013-06-21 | Payer: Self-pay | Source: Ambulatory Visit

## 2013-07-01 ENCOUNTER — Telehealth: Payer: Self-pay | Admitting: Oncology

## 2013-07-01 ENCOUNTER — Telehealth: Payer: Self-pay | Admitting: Pharmacist

## 2013-07-01 NOTE — Telephone Encounter (Signed)
, °

## 2013-07-01 NOTE — Telephone Encounter (Signed)
Pt cancelled her appt for coumadin clinic 07/02/13 due to her grandmother falling. Pt needs to come to Coumadin clinic before 07/17/13 (the date changed by scheduling today). She may be coming off Coumadin & Dr. Jana Hakim needs to see pt to make that decision per Micah Flesher, PA.   Pt will need to reschedule 07/17/13 appts & I will try to get back in touch w/ pt tomorrow by phone to determine when she is available to get her INR checked again ASAP. Kennith Center, Pharm.D., CPP 07/01/2013@5 :07 PM

## 2013-07-02 ENCOUNTER — Other Ambulatory Visit: Payer: Self-pay

## 2013-07-02 ENCOUNTER — Telehealth: Payer: Self-pay | Admitting: Pharmacist

## 2013-07-02 ENCOUNTER — Ambulatory Visit: Payer: Self-pay

## 2013-07-02 ENCOUNTER — Encounter: Payer: Self-pay | Admitting: Oncology

## 2013-07-02 NOTE — Telephone Encounter (Signed)
Pt will come for lab/Coumadin clinic on 07/09/13 (pt preference) at 10:30 am for lab; 10:45 am for Coumadin clinic. Her appts for 07/17/13 need to be cancelled & she needs a call from scheduling to schedule MD visit w/ [name] ([name] is off 3/18) per Micah Flesher, PA. It is possible that Dr. Jana Hakim will be discontinuing pts anticoag at next MD visit. I have notified Michaela Corner in scheduling of this via in-basket message. Kennith Center, Pharm.D., CPP 07/02/2013@4 :45 PM

## 2013-07-03 NOTE — Telephone Encounter (Signed)
Lm informed the pt that Barnett Applebaum requested for me to cancel her appts for 07/17/13. i made the pt aware that [facility] first opening is  09/02/13. gv appt for 09/02/13 w/ labs@ 1:30p and ov @ 2pm. Pt is aware that i will mail a letter/avs...td

## 2013-07-09 ENCOUNTER — Other Ambulatory Visit (HOSPITAL_BASED_OUTPATIENT_CLINIC_OR_DEPARTMENT_OTHER): Payer: Self-pay

## 2013-07-09 ENCOUNTER — Ambulatory Visit (HOSPITAL_BASED_OUTPATIENT_CLINIC_OR_DEPARTMENT_OTHER): Payer: Self-pay | Admitting: Pharmacist

## 2013-07-09 DIAGNOSIS — I82409 Acute embolism and thrombosis of unspecified deep veins of unspecified lower extremity: Secondary | ICD-10-CM

## 2013-07-09 DIAGNOSIS — R600 Localized edema: Secondary | ICD-10-CM

## 2013-07-09 DIAGNOSIS — Z7901 Long term (current) use of anticoagulants: Secondary | ICD-10-CM

## 2013-07-09 DIAGNOSIS — C50911 Malignant neoplasm of unspecified site of right female breast: Secondary | ICD-10-CM

## 2013-07-09 DIAGNOSIS — I82729 Chronic embolism and thrombosis of deep veins of unspecified upper extremity: Secondary | ICD-10-CM

## 2013-07-09 DIAGNOSIS — M792 Neuralgia and neuritis, unspecified: Secondary | ICD-10-CM

## 2013-07-09 LAB — POCT INR: INR: 1.3

## 2013-07-09 LAB — PROTIME-INR
INR: 1.3 — AB (ref 2.00–3.50)
PROTIME: 15.6 s — AB (ref 10.6–13.4)

## 2013-07-09 NOTE — Progress Notes (Signed)
INR below goal of 2-3.  Crystal [name] states that she only takes coumadin about 1/2 the time.  She "hates taking it and doesn't want to take it anymore."  The coumadin makes her feel tired.  We discussed the risks of not being compliant on coumadin and possibility of having another clot.  She would like to schedule an ultrasound to see if her clot is gone and will be calling Dr. Jana Hakim RN.  Crystal [name] has an appt with Dr. Jana Hakim in May and will discuss with him at this time stopping the coumadin.  For ease of remembering dose, I have changed coumadin to 7.5mg  daily and will recheck PT/INR in 2 weeks.  I have strongly urged Crystal [name] to be compliant with her coumadin.

## 2013-07-15 ENCOUNTER — Other Ambulatory Visit: Payer: Self-pay | Admitting: *Deleted

## 2013-07-16 ENCOUNTER — Telehealth: Payer: Self-pay | Admitting: Oncology

## 2013-07-16 NOTE — Telephone Encounter (Signed)
s.w. pt and advised on march appt....pt ok and aware °

## 2013-07-17 ENCOUNTER — Ambulatory Visit: Payer: Self-pay | Admitting: Physician Assistant

## 2013-07-17 ENCOUNTER — Other Ambulatory Visit: Payer: Self-pay

## 2013-07-17 ENCOUNTER — Ambulatory Visit: Payer: Self-pay

## 2013-07-23 ENCOUNTER — Ambulatory Visit: Payer: Self-pay | Admitting: Pharmacist

## 2013-07-23 ENCOUNTER — Ambulatory Visit: Payer: Self-pay

## 2013-07-23 ENCOUNTER — Other Ambulatory Visit (HOSPITAL_BASED_OUTPATIENT_CLINIC_OR_DEPARTMENT_OTHER): Payer: Self-pay

## 2013-07-23 ENCOUNTER — Ambulatory Visit (HOSPITAL_BASED_OUTPATIENT_CLINIC_OR_DEPARTMENT_OTHER): Payer: Self-pay

## 2013-07-23 VITALS — BP 129/88 | HR 96 | Temp 98.5°F

## 2013-07-23 DIAGNOSIS — I82729 Chronic embolism and thrombosis of deep veins of unspecified upper extremity: Secondary | ICD-10-CM

## 2013-07-23 DIAGNOSIS — I82629 Acute embolism and thrombosis of deep veins of unspecified upper extremity: Secondary | ICD-10-CM

## 2013-07-23 DIAGNOSIS — C50911 Malignant neoplasm of unspecified site of right female breast: Secondary | ICD-10-CM

## 2013-07-23 DIAGNOSIS — R600 Localized edema: Secondary | ICD-10-CM

## 2013-07-23 DIAGNOSIS — M792 Neuralgia and neuritis, unspecified: Secondary | ICD-10-CM

## 2013-07-23 DIAGNOSIS — C773 Secondary and unspecified malignant neoplasm of axilla and upper limb lymph nodes: Secondary | ICD-10-CM

## 2013-07-23 DIAGNOSIS — C50411 Malignant neoplasm of upper-outer quadrant of right female breast: Secondary | ICD-10-CM

## 2013-07-23 DIAGNOSIS — C50919 Malignant neoplasm of unspecified site of unspecified female breast: Secondary | ICD-10-CM

## 2013-07-23 DIAGNOSIS — Z5111 Encounter for antineoplastic chemotherapy: Secondary | ICD-10-CM

## 2013-07-23 DIAGNOSIS — Z95828 Presence of other vascular implants and grafts: Secondary | ICD-10-CM

## 2013-07-23 DIAGNOSIS — C50419 Malignant neoplasm of upper-outer quadrant of unspecified female breast: Secondary | ICD-10-CM

## 2013-07-23 DIAGNOSIS — G43909 Migraine, unspecified, not intractable, without status migrainosus: Secondary | ICD-10-CM

## 2013-07-23 LAB — PROTIME-INR
INR: 1.4 — AB (ref 2.00–3.50)
PROTIME: 16.8 s — AB (ref 10.6–13.4)

## 2013-07-23 LAB — POCT INR: INR: 1.4

## 2013-07-23 MED ORDER — SODIUM CHLORIDE 0.9 % IJ SOLN
10.0000 mL | INTRAMUSCULAR | Status: DC | PRN
  Administered 2013-07-23: 10 mL via INTRAVENOUS
  Filled 2013-07-23: qty 10

## 2013-07-23 MED ORDER — HEPARIN SOD (PORK) LOCK FLUSH 100 UNIT/ML IV SOLN
500.0000 [IU] | Freq: Once | INTRAVENOUS | Status: AC
  Administered 2013-07-23: 500 [IU] via INTRAVENOUS
  Filled 2013-07-23: qty 5

## 2013-07-23 MED ORDER — GOSERELIN ACETATE 10.8 MG ~~LOC~~ IMPL
10.8000 mg | DRUG_IMPLANT | SUBCUTANEOUS | Status: DC
  Administered 2013-07-23: 10.8 mg via SUBCUTANEOUS
  Filled 2013-07-23: qty 10.8

## 2013-07-23 NOTE — Patient Instructions (Signed)
INR below goal Continue to take coumadin 7.5mg  (1 1/2 tablets) daily. Recheck INR on Tuesday 08/13/13 at 11am for lab and 11:15am for coumadin clinic.

## 2013-07-23 NOTE — Progress Notes (Signed)
INR below goal today at 1.4 Pt was apologetic because she missed 2 doses again this week and she knows this is something she needs to work on Otherwise patient is doing well with no new complaints No issues regarding unusual bleeding or bruising No diet or medication changes Patient requests not to increase her dose as 7.5 mg daily is easiest for her to remember and she feels that increased doses add to her generalized fatigue and weakness Plan: No changes at patient request Reinforced importance of taking coumadin every day and to use pill box or calendar if necessary (patient used to use a pill box but has stopped) Continue to take coumadin 7.5mg  (1 1/2 tablets) daily. Recheck INR on Tuesday 08/13/13 at 11am for lab and 11:15am for coumadin clinic.

## 2013-07-23 NOTE — Patient Instructions (Signed)
Goserelin injection What is this medicine? GOSERELIN (GOE se rel in) is similar to a hormone found in the body. It lowers the amount of sex hormones that the body makes. Men will have lower testosterone levels and women will have lower estrogen levels while taking this medicine. In men, this medicine is used to treat prostate cancer; the injection is either given once per month or once every 12 weeks. A once per month injection (only) is used to treat women with endometriosis, dysfunctional uterine bleeding, or advanced breast cancer. This medicine may be used for other purposes; ask your health care provider or pharmacist if you have questions. COMMON BRAND NAME(S): Zoladex What should I tell my health care provider before I take this medicine? They need to know if you have any of these conditions (some only apply to women): -diabetes -heart disease or previous heart attack -high blood pressure -high cholesterol -kidney disease -osteoporosis or low bone density -problems passing urine -spinal cord injury -stroke -tobacco smoker -an unusual or allergic reaction to goserelin, hormone therapy, other medicines, foods, dyes, or preservatives -pregnant or trying to get pregnant -breast-feeding How should I use this medicine? This medicine is for injection under the skin. It is given by a health care professional in a hospital or clinic setting. Men receive this injection once every 4 weeks or once every 12 weeks. Women will only receive the once every 4 weeks injection. Talk to your pediatrician regarding the use of this medicine in children. Special care may be needed. Overdosage: If you think you have taken too much of this medicine contact a poison control center or emergency room at once. NOTE: This medicine is only for you. Do not share this medicine with others. What if I miss a dose? It is important not to miss your dose. Call your doctor or health care professional if you are unable to  keep an appointment. What may interact with this medicine? -female hormones like estrogen -herbal or dietary supplements like black cohosh, chasteberry, or DHEA -female hormones like testosterone -prasterone This list may not describe all possible interactions. Give your health care provider a list of all the medicines, herbs, non-prescription drugs, or dietary supplements you use. Also tell them if you smoke, drink alcohol, or use illegal drugs. Some items may interact with your medicine. What should I watch for while using this medicine? Visit your doctor or health care professional for regular checks on your progress. Your symptoms may appear to get worse during the first weeks of this therapy. Tell your doctor or healthcare professional if your symptoms do not start to get better or if they get worse after this time. Your bones may get weaker if you take this medicine for a long time. If you smoke or frequently drink alcohol you may increase your risk of bone loss. A family history of osteoporosis, chronic use of drugs for seizures (convulsions), or corticosteroids can also increase your risk of bone loss. Talk to your doctor about how to keep your bones strong. This medicine should stop regular monthly menstration in women. Tell your doctor if you continue to menstrate. Women should not become pregnant while taking this medicine or for 12 weeks after stopping this medicine. Women should inform their doctor if they wish to become pregnant or think they might be pregnant. There is a potential for serious side effects to an unborn child. Talk to your health care professional or pharmacist for more information. Do not breast-feed an infant while taking   this medicine. Men should inform their doctors if they wish to father a child. This medicine may lower sperm counts. Talk to your health care professional or pharmacist for more information. What side effects may I notice from receiving this  medicine? Side effects that you should report to your doctor or health care professional as soon as possible: -allergic reactions like skin rash, itching or hives, swelling of the face, lips, or tongue -bone pain -breathing problems -changes in vision -chest pain -feeling faint or lightheaded, falls -fever, chills -pain, swelling, warmth in the leg -pain, tingling, numbness in the hands or feet -swelling of the ankles, feet, hands -trouble passing urine or change in the amount of urine -unusually high or low blood pressure -unusually weak or tired Side effects that usually do not require medical attention (report to your doctor or health care professional if they continue or are bothersome): -change in sex drive or performance -changes in breast size in both males and females -changes in emotions or moods -headache -hot flashes -irritation at site where injected -loss of appetite -skin problems like acne, dry skin -vaginal dryness This list may not describe all possible side effects. Call your doctor for medical advice about side effects. You may report side effects to FDA at 1-800-FDA-1088. Where should I keep my medicine? This drug is given in a hospital or clinic and will not be stored at home. NOTE: This sheet is a summary. It may not cover all possible information. If you have questions about this medicine, talk to your doctor, pharmacist, or health care provider.  2014, [employer]/Gold Standard. (2008-09-02 13:28:29)   Implanted [facility] Guide An implanted port is a type of central line that is placed under the skin. Central lines are used to provide IV access when treatment or nutrition needs to be given through a person's veins. Implanted ports are used for long-term IV access. An implanted port may be placed because:   You need IV medicine that would be irritating to the small veins in your hands or arms.   You need long-term IV medicines, such as antibiotics.   You  need IV nutrition for a long period.   You need frequent blood draws for lab tests.   You need dialysis.  Implanted ports are usually placed in the chest area, but they can also be placed in the upper arm, the abdomen, or the leg. An implanted port has two main parts:   Reservoir. The reservoir is round and will appear as a small, raised area under your skin. The reservoir is the part where a needle is inserted to give medicines or draw blood.   Catheter. The catheter is a thin, flexible tube that extends from the reservoir. The catheter is placed into a large vein. Medicine that is inserted into the reservoir goes into the catheter and then into the vein.  HOW WILL I CARE FOR MY INCISION SITE? Do not get the incision site wet. Bathe or shower as directed by your health care provider.  HOW IS MY PORT ACCESSED? Special steps must be taken to access the port:   Before the port is accessed, a numbing cream can be placed on the skin. This helps numb the skin over the port site.   Your health care provider uses a sterile technique to access the port.  Your health care provider must put on a mask and sterile gloves.  The skin over your port is cleaned carefully with an antiseptic and allowed to  dry.  The port is gently pinched between sterile gloves, and a needle is inserted into the port.  Only "non-coring" port needles should be used to access the port. Once the port is accessed, a blood return should be checked. This helps ensure that the port is in the vein and is not clogged.   If your port needs to remain accessed for a constant infusion, a clear (transparent) bandage will be placed over the needle site. The bandage and needle will need to be changed every week, or as directed by your health care provider.   Keep the bandage covering the needle clean and dry. Do not get it wet. Follow your health care provider's instructions on how to take a shower or bath while the port is  accessed.   If your port does not need to stay accessed, no bandage is needed over the port.  WHAT IS FLUSHING? Flushing helps keep the port from getting clogged. Follow your health care provider's instructions on how and when to flush the port. Ports are usually flushed with saline solution or a medicine called heparin. The need for flushing will depend on how the port is used.   If the port is used for intermittent medicines or blood draws, the port will need to be flushed:   After medicines have been given.   After blood has been drawn.   As part of routine maintenance.   If a constant infusion is running, the port may not need to be flushed.  HOW LONG WILL MY PORT STAY IMPLANTED? The port can stay in for as long as your health care provider thinks it is needed. When it is time for the port to come out, surgery will be done to remove it. The procedure is similar to the one performed when the port was put in.  WHEN SHOULD I SEEK IMMEDIATE MEDICAL CARE? When you have an implanted port, you should seek immediate medical care if:   You notice a bad smell coming from the incision site.   You have swelling, redness, or drainage at the incision site.   You have more swelling or pain at the port site or the surrounding area.   You have a fever that is not controlled with medicine. Document Released: 04/18/2005 Document Revised: 02/06/2013 Document Reviewed: 12/24/2012 [facility] Patient Information 2014 [place].

## 2013-08-05 MED ORDER — WARFARIN SODIUM 5 MG PO TABS: ORAL_TABLET | ORAL | Status: DC

## 2013-08-13 ENCOUNTER — Other Ambulatory Visit (HOSPITAL_BASED_OUTPATIENT_CLINIC_OR_DEPARTMENT_OTHER): Payer: Self-pay

## 2013-08-13 ENCOUNTER — Ambulatory Visit (HOSPITAL_BASED_OUTPATIENT_CLINIC_OR_DEPARTMENT_OTHER): Payer: Self-pay | Admitting: Pharmacist

## 2013-08-13 DIAGNOSIS — C50911 Malignant neoplasm of unspecified site of right female breast: Secondary | ICD-10-CM

## 2013-08-13 DIAGNOSIS — M792 Neuralgia and neuritis, unspecified: Secondary | ICD-10-CM

## 2013-08-13 DIAGNOSIS — I82409 Acute embolism and thrombosis of unspecified deep veins of unspecified lower extremity: Secondary | ICD-10-CM

## 2013-08-13 DIAGNOSIS — R600 Localized edema: Secondary | ICD-10-CM

## 2013-08-13 DIAGNOSIS — I82729 Chronic embolism and thrombosis of deep veins of unspecified upper extremity: Secondary | ICD-10-CM

## 2013-08-13 LAB — PROTIME-INR
INR: 1.2 — ABNORMAL LOW (ref 2.00–3.50)
Protime: 14.4 Seconds — ABNORMAL HIGH (ref 10.6–13.4)

## 2013-08-13 LAB — POCT INR: INR: 1.2

## 2013-08-13 NOTE — Patient Instructions (Signed)
INR below goal due to missed doses Continue to take coumadin 7.5mg  (1 1/2 tablets) daily. Recheck INR on Tuesday 09/02/13 at 1:30 for lab and MD visit at 2pm and 2:30pm for coumadin clinic.

## 2013-08-13 NOTE — Progress Notes (Signed)
INR below goal due to missed doses Pt is doing well besides continued fatigue Pt reports non-compliance again with her coumadin She states she has missed at least 3 doses of coumadin in the last week or so. She reports no unusual bleeding or bruising No signs or symptoms of clot No medication or diet changes Crystal [name]'s non-compliance with coumadin has become a concern. She voices no desire to want to take coumadin or continue to take coumadin We have counseled her on multiple occasions at her appointments on the importance of compliance with coumadin and the risks of non-compliance and have tried to aid in improving compliance by counseling pt on calendar reminders and pill boxes. Despite are best efforts, pt's INR continues to be subtherapuetic. And it is difficult to judge if patient needs a dose increase or not due to the multiple missed doses. Crystal [name] states she does not want a dose increase and will try to be more compliant on 7.5 mg daily Pt may possibly discuss completing coumadin therapy at her next visit with Dr. Jana Hakim on 09/02/13.   Plan: Continue to take coumadin 7.5mg  (1 1/2 tablets) daily. Recheck INR on Tuesday 09/02/13 at 1:30 for lab and MD visit at 2pm to discuss stopping coumadin and 2:30pm for coumadin clinic if needed.

## 2013-08-16 ENCOUNTER — Telehealth: Payer: Self-pay | Admitting: *Deleted

## 2013-08-16 NOTE — Telephone Encounter (Signed)
Pt is calling requesting increase in her Topamax. Please advise

## 2013-08-19 MED ORDER — TOPIRAMATE 50 MG PO TABS: 100.0000 mg | ORAL_TABLET | Freq: Two times a day (BID) | ORAL | Status: DC

## 2013-08-19 NOTE — Telephone Encounter (Signed)
I called patient. She is taking TPX 50mg  BID. Requesting higher dose. Will increase to 50 / 100, then up to 100mg  BID if needed to help with headaches. Needs to monitor INR.   Penni Bombard, MD 1/47/0929, 5:74 PM Certified in Neurology, Neurophysiology and Neuroimaging  [facility] [address] [place], [place] [address] [phone]

## 2013-08-19 NOTE — Telephone Encounter (Signed)
Pt called back to see if there has been approval for the increase on her medication topiramate (TOPAMAX) 50 MG tablet please call pt concerning this matter and when it has been called into the pharmacy. Thanks

## 2013-08-25 ENCOUNTER — Other Ambulatory Visit: Payer: Self-pay | Admitting: Family Medicine

## 2013-09-02 ENCOUNTER — Ambulatory Visit (HOSPITAL_BASED_OUTPATIENT_CLINIC_OR_DEPARTMENT_OTHER): Payer: Self-pay | Admitting: Oncology

## 2013-09-02 ENCOUNTER — Other Ambulatory Visit (HOSPITAL_BASED_OUTPATIENT_CLINIC_OR_DEPARTMENT_OTHER): Payer: Self-pay

## 2013-09-02 ENCOUNTER — Ambulatory Visit: Payer: Self-pay

## 2013-09-02 VITALS — BP 116/84 | HR 103 | Temp 98.2°F | Resp 20 | Ht 64.5 in | Wt 136.1 lb

## 2013-09-02 DIAGNOSIS — I82729 Chronic embolism and thrombosis of deep veins of unspecified upper extremity: Secondary | ICD-10-CM

## 2013-09-02 DIAGNOSIS — I82629 Acute embolism and thrombosis of deep veins of unspecified upper extremity: Secondary | ICD-10-CM

## 2013-09-02 DIAGNOSIS — G40802 Other epilepsy, not intractable, without status epilepticus: Secondary | ICD-10-CM

## 2013-09-02 DIAGNOSIS — C50411 Malignant neoplasm of upper-outer quadrant of right female breast: Secondary | ICD-10-CM

## 2013-09-02 DIAGNOSIS — C50419 Malignant neoplasm of upper-outer quadrant of unspecified female breast: Secondary | ICD-10-CM

## 2013-09-02 LAB — PROTIME-INR
INR: 1.6 — ABNORMAL LOW (ref 2.00–3.50)
Protime: 19.2 Seconds — ABNORMAL HIGH (ref 10.6–13.4)

## 2013-09-02 NOTE — Addendum Note (Signed)
Addended by: Laureen Abrahams on: 09/02/2013 05:27 PM   Modules accepted: Orders

## 2013-09-02 NOTE — Progress Notes (Signed)
ID: Crystal [name]   DOB: 1976/01/06  MR#: [id number]  SHF#:[id number]  FOY:DXAJOIN,OMVEHMC, MD GYN: SULorine Bears, MD OTHER:  Elmarie Mainland   CHIEF COMPLAINT:  Hx of Right Breast Cancer   HISTORY OF PRESENT ILLNESS: The patient is a 38 year old [name] who noted a mass in her right breast and brought it to her primary care physician's attention June of 2012. She was set up for mammography at [facility], where an area of calcifications highly suspicious for carcinoma was biopsied. This was in the upper outer quadrant and it measured 1.7 cm mammographically and 1.8 cm by ultrasonography. The pathology report ([id number]) showed a high-grade invasive ductal carcinoma estrogen receptor positive at 64% progesterone receptor positive at 18%, and HER-2 amplified with a ratio by CISH of 5.24. MIB-1-1 was 65%. A second mass biopsied at the same time was also triple positive.  Given her multiple masses in the right breast, mastectomy was recommended. The patient actually opted for bilateral mastectomies and this was performed together with a right axillary lymph node dissection under Dr. Star Age on 11/16/2010. The final pathology [id number]) showed, on the left, no evidence of cancer. On the right the patient had a high-grade invasive ductal carcinoma measuring 1.9 cm, with negative margins, grade 3, involving 2 of 16 lymph nodes (stage IIB).  Her subsequent history is as detailed below.  INTERVAL HISTORY:  Crystal [name] returns alone today for followup of herright  breast cancer. She continues to receive does run injections every 3 months, last given 04/29/2013 and due again in March. She was started on anastrozole in November, but is having multiple side effects which will be discussed below. She also continues on therapeutic Coumadin for history of upper Chairman the DVT, and is followed by our Coumadin clinic.  Crystal [name] was very tearful and anxious during our visit today. She tells me  she is "just so tired" with extreme fatigue. She's having terrible hot flashes, and has no relief from the gabapentin which she has now discontinued. She feels miserable. She's having increased headaches. Her fibromyalgia pain has worsened. She does believe that there have been significant increases in the hot flashes, headaches, and joint pain since starting the anastrozole.  Recall that Crystal [name] has a history of seizures. She has a history of migraines and tells me that he has "tried everything" and that hydrocodone/APAP is the "only thing that works". She was given a prescription by her primary care physician for hydrocodone/APAP yesterday, only a few tablets for her migraines. She tells me that have tried all of the "migraine medications" such as Imitrex and that "none of those work. She is adamant that she needs to hydrocodone/APAP. She continues on Celexa, so of course cannot take the tramadol. She's taking Tylenol at home with only minimal relief.  We had recommended that Crystal [name] be seen by the pain clinic, and also by her neurologist, but she tells me she doesn't have insurance, "can't get Medicaid", and "can't afford those visits".  REVIEW OF SYSTEMS: Crystal [name] had a recent "virus" but this has resolved and she's had no fevers or chills over the past couple of weeks. She does have night sweats associated with the hot flashes. She denies any cough, phlegm production, shortness of breath, chest pain, or palpitations. She's had no peripheral swelling. She's eating and drinking fairly well and has no nausea or change in bowel habits. She does have some reflux. She bruises easily but has had no abnormal bleeding. She's had  no rashes or skin changes. She continues to have pain "throughout her body" which she describes as stabbing, throbbing, aching, and cramping. She feels forgetful. She feels depressed. She denies suicidal ideation and declines support services. In summary, her review of systems is generally  positive.   A detailed review of systems is otherwise noncontributory.   PAST MEDICAL HISTORY: Past Medical History  Diagnosis Date  . Status post chemotherapy     docetaxel/carboplatin/trastuzumab  . Anxiety   . Hx MRSA infection 03/28/11    Skin  . History of breast cancer DX JUNE 2012 W/ RIGHT BREAST INVASIVE DUCTAL CA  STAGE IIB---  S/P BILATERAL MASTECTOMIES AND RIGHT NODE DISSECTION    ONCOLOGIST-- DR Jana Hakim--  CHEMO ENDED [place] 2012 / RADIATION ENDED MAR 2013--  CURRENT ON TAMOXIFEN AND ZOLADEX  . Family history of anesthesia complication     MOTHER PONV  . Hot flashes due to tamoxifen   . History of idiopathic seizure     DX 2008 --  LAST ONE 2012--  NO ISSUES SINCE AND NO MEDS. -- PT STATES DOCTORS FELT IT WAS STRESS RELATED DUE TO HEALTH ISSUES  . Bipolar affective disorder, mixed   . GERD (gastroesophageal reflux disease)     OCCASIONALLY TAKE ZANTAC  . Left ovarian cyst   . Right lower quadrant abdominal pain   . Frequency of urination   . Urgency of urination   . Strains to urinate   . Nocturia   . Heart palpitations   . Cancer   . Depression   . Seizures   1. History of seizures. The patient has been thoroughly evaluated both here by Dr. Gaynell Face and at [facility] for this. Among many studies, she had an MRI of the brain on August 28th. This showed an incidental solitary small lesion in the left frontoparietal white matter. It was not felt to be suggestive of multiple sclerosis. A noncontrasted CT in August 2010 was negative. The patient has been off all seizure medications now for 2 months. There has been no evidence of seizure recurrence. 2. Complex psychology history (please refer to Dr. Iona Coach note in the E-chart from August 29, 2009) with evidence of anxiety disorder, depression and psychosis. The patient is currently on no medications for this and denies any of these symptoms at present. 3. History of migraines. 4. Remote history of multidrug abuse. 5. Status  post wisdom teeth extraction.         History of MRSA skin infection   PAST SURGICAL HISTORY: Past Surgical History  Procedure Laterality Date  . Wisdom tooth extraction    . Biopsy breast  10/14/2010    right needle core biopsy  . Portacath placement  11/16/2010    placement of left subclavian port  . I&d extremity  09/26/2011    Procedure: IRRIGATION AND DEBRIDEMENT EXTREMITY;  Surgeon: Tennis Must, MD;  Location: [facility];  Service: Orthopedics;  Laterality: Right;  I&D right hand cat bite wound  . Breast surgery  11/16/2010    bilateral mastectomy+ right axillary node dissection,T1cN1a, Her2+,ERPR+  (right breast invasive ductal carcinoma)  . Transthoracic echocardiogram  05-28-2012    LVF NORMAL/  EF 55-60%  . Cysto with hydrodistension N/A 07/23/2012    Procedure: CYSTOSCOPY/HYDRODISTENSION instillation of marcaine and pyridium;  Surgeon: Reece Packer, MD;  Location: [place];  Service: Urology;  Laterality: N/A;  INSTILLATION      FAMILY HISTORY Family History  Problem Relation Age of Onset  .  Cancer Maternal Grandfather   . Mental illness Brother   . Mental illness Son   The patient's mother is living, in her mid 9s, with no history of breast or ovarian cancer. The patient does not know her biological father. The patient has an older brother with bipolar disease, and according to the patient, borderline schizophrenia.   GYNECOLOGIC HISTORY: (Updated January 2015) Menarche at age 29. She is G1, P1 with first pregnancy to term at age 40. She was not menopausal, and was still having regular periods at the time of her breast cancer diagnosis. She continues on every three-month goserelin.  SOCIAL HISTORY:  (Updated 05/21/2013) She used to work in a [employer] but had to leave that job because of the seizure problem. She's currently unemployed. Her husband , Crystal [name], works as a Development worker, international aid. Crystal [name] has a son, Crystal [name], who is 51 and recently moved in with  his father. Crystal [name], according to the patient, has significant nausea problems. The patient attends a local [employer]. 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    ADVANCED DIRECTIVES: Not in place  HEALTH MAINTENANCE:  (Updated 05/21/2013) History  Substance Use Topics  . Smoking status: Current Every Day Smoker -- 1.00 packs/day for 20 years    Types: Cigarettes  . Smokeless tobacco: Never Used  . Alcohol Use: Yes     Colonoscopy: Never  PAP: July 2012  Bone density:  Never  Lipid panel: Not on file  Allergies  Allergen Reactions  . Tramadol Other (See Comments)    Seizures  . Phenytoin Nausea And Vomiting  . Thorazine [Chlorpromazine Hcl] Hives  . Ciprofloxacin Nausea And Vomiting  . Vancomycin Rash    Possible rash reported by MD    Current Outpatient Prescriptions  Medication Sig Dispense Refill  . acetaminophen (TYLENOL) 500 MG tablet Take 1,000 mg by mouth every 8 (eight) hours as needed for pain.      Marland Kitchen ALPRAZolam (XANAX) 1 MG tablet Take 1 three times a day for anxiety.  90 tablet  4  . carvedilol (COREG) 6.25 MG tablet For heart palpitations (rapid heart beat).  Take 1 tablet in the morning and 1 and 1/2 tabs in the evening  75 tablet  11  . citalopram (CELEXA) 20 MG tablet Take 2 tablets (40 mg total) by mouth daily. PATIENT NEEDS OFFICE VISIT FOR ADDITIONAL REFILLS  60 tablet  0  . goserelin (ZOLADEX) 10.8 MG injection Inject 10.8 mg into the skin every 3 (three) months.      . ranitidine (ZANTAC) 150 MG tablet Take 150 mg by mouth 2 (two) times daily as needed for heartburn.      . topiramate (TOPAMAX) 50 MG tablet Take 2 tablets (100 mg total) by mouth 2 (two) times daily.  120 tablet  12  . warfarin (COUMADIN) 5 MG tablet Take 1.5 tablets daily by mouth or as instructed by coumadin clinic  45 tablet  1   No current facility-administered medications for this visit.   OBJECTIVE: Young white female who appears tearful and anxious Filed Vitals:   09/02/13 1343  BP: 116/84   Pulse: 103  Temp: 98.2 F (36.8 C)  Resp: 20  ECOG: 2 Body mass index is 23.01 kg/(m^2). Filed Weights   09/02/13 1343  Weight: 136 lb 1.6 oz (61.735 kg)   Physical Exam: HEENT:  Sclerae anicteric.  Oropharynx clear and moist. Neck supple, trachea midline  NODES:  No cervical or supraclavicular lymphadenopathy palpated.  BREAST EXAM:  Patient is status post  bilateral mastectomies. Axillae are benign bilaterally, with no palpable lymphadenopathy. LUNGS:  Clear to auscultation bilaterally.  No wheezes or rhonchi HEART:  Regular rate and rhythm. No murmur  ABDOMEN:  Soft, nondistended, nontender.  Positive bowel sounds.  MSK:  No focal spinal tenderness to palpation. Good range of motion bilaterally in the upper extremities. EXTREMITIES:  No peripheral edema.   SKIN:  Benign with no visible rashes or skin lesions. No pallor. No visible ecchymoses or petechiae.  NEURO:  Nonfocal. Well oriented.  Volatile affect.     LAB RESULTS: Lab Results  Component Value Date   WBC 7.5 05/21/2013   NEUTROABS 4.0 05/21/2013   HGB 14.7 05/21/2013   HCT 42.3 05/21/2013   MCV 100.0 05/21/2013   PLT 222 05/21/2013      Chemistry      Component Value Date/Time   NA 143 05/21/2013 1056   NA 138 08/22/2012 1700   K 4.5 05/21/2013 1056   K 3.7 08/22/2012 1700   CL 105 10/11/2012 1441   CL 103 08/22/2012 1700   CO2 28 05/21/2013 1056   CO2 23 08/22/2012 1700   BUN 16.3 05/21/2013 1056   BUN 11 08/22/2012 1700   CREATININE 0.9 05/21/2013 1056   CREATININE 0.96 08/22/2012 1700      Component Value Date/Time   CALCIUM 10.0 05/21/2013 1056   CALCIUM 9.1 08/22/2012 1700   ALKPHOS 77 05/21/2013 1056   ALKPHOS 87 08/22/2012 1700   AST 43* 05/21/2013 1056   AST 33 08/22/2012 1700   ALT 39 05/21/2013 1056   ALT 24 08/22/2012 1700   BILITOT 0.42 05/21/2013 1056   BILITOT 0.2* 08/22/2012 1700         STUDIES: No results found.    ASSESSMENT: 38 y.o.  [place] woman, known to be BRCA1 and 2 negative,    (1) status post bilateral mastectomies May of 2012 for a right-sided T1c N1 (stage II) grade 3 invasive ductal carcinoma,estrogen and progesterone receptor positive, HER-2 amplified, with an MIB-1-1 of 85%.   (2) status post carboplatin/ docetaxol/ trastuzumab x6, completed mid December 2012   (3) on trastuzumab every 3 weeks, started August of 2012, interrupted between April and August of 2013, completed 04/27/2012. Most recent echo 03/19/2012  (4) status post radiation therapy, completed 07/12/2011  (5) On tamoxifen starting April 2013, held as of 12/27/2012 with the diagnosis of a left upper extremity DVT.  On therapeutic Coumadin until  May 2015  (6) On goserelin given every 3 months, 1st injection on 10/26/11, most recent dose 04/29/2013.   (7) started on anastrozole November 2014, discontinued January 2015 because of side effects  Other problems:  (1) Chronic Pain/ fibromyalgia  (2) history of seizure disorder   (3) history of severe migraines    PLAN:  Esteen is interested in stopping the Coumadin and the visits to the Coumadin clinic. The problem is she still has a port in place. She would prefer not to return to surgery for port removal, because there are some outstanding bills that she still has not been able to pay. We will save radiation oncology can remove the port for her.  I think the problems with Crystal [name] are very understandable but I am concerned as she is that it may affect her marriage in a negative way. I am going to ask our social workers to see if we can find a way to get them some marital counseling and also a referral to a gynecologist. Since she doesn't have  any insurance that may be complex. I did ask her to call for our "pelvic health" program. I think that in itself may prove helpful.  I would like to start another aromatase inhibitor, but I don't see how we can do it when she feels the lack of estrogen is the main issue. There are many other things that can  affect libido, including depression, stress, pain, and of course medications for all these things. Accordingly I think if we stopped all antiestrogen therapy and started her on estrogen she might still have libido problems.  She will return see me in 3 months. She knows to call for any problems that may develop before that visit.     09/02/2013     Chauncey Cruel, MD

## 2013-09-03 ENCOUNTER — Other Ambulatory Visit: Payer: Self-pay | Admitting: Radiology

## 2013-09-03 ENCOUNTER — Other Ambulatory Visit: Payer: Self-pay | Admitting: Oncology

## 2013-09-03 ENCOUNTER — Encounter (HOSPITAL_COMMUNITY): Payer: Self-pay | Admitting: Pharmacy Technician

## 2013-09-03 ENCOUNTER — Telehealth: Payer: Self-pay | Admitting: *Deleted

## 2013-09-03 DIAGNOSIS — C50919 Malignant neoplasm of unspecified site of unspecified female breast: Secondary | ICD-10-CM

## 2013-09-03 NOTE — Telephone Encounter (Signed)
This RN verified with MD that he advised pt to STOP her coumadin per visit on 09/02/2013.  The above information was relayed to Tiffany in the IR department per MD request to remove port a cath.

## 2013-09-05 ENCOUNTER — Encounter (HOSPITAL_COMMUNITY): Payer: Self-pay

## 2013-09-05 ENCOUNTER — Ambulatory Visit (HOSPITAL_COMMUNITY)
Admission: RE | Admit: 2013-09-05 | Discharge: 2013-09-05 | Disposition: A | Discharge status: Home / Self Care | Payer: Self-pay | Source: Ambulatory Visit | Attending: Oncology | Admitting: Oncology

## 2013-09-05 DIAGNOSIS — F411 Generalized anxiety disorder: Secondary | ICD-10-CM | POA: Insufficient documentation

## 2013-09-05 DIAGNOSIS — Z79899 Other long term (current) drug therapy: Secondary | ICD-10-CM | POA: Insufficient documentation

## 2013-09-05 DIAGNOSIS — K219 Gastro-esophageal reflux disease without esophagitis: Secondary | ICD-10-CM | POA: Insufficient documentation

## 2013-09-05 DIAGNOSIS — Z923 Personal history of irradiation: Secondary | ICD-10-CM | POA: Insufficient documentation

## 2013-09-05 DIAGNOSIS — F17200 Nicotine dependence, unspecified, uncomplicated: Secondary | ICD-10-CM | POA: Insufficient documentation

## 2013-09-05 DIAGNOSIS — Z86718 Personal history of other venous thrombosis and embolism: Secondary | ICD-10-CM | POA: Insufficient documentation

## 2013-09-05 DIAGNOSIS — C50919 Malignant neoplasm of unspecified site of unspecified female breast: Secondary | ICD-10-CM | POA: Insufficient documentation

## 2013-09-05 DIAGNOSIS — Z452 Encounter for adjustment and management of vascular access device: Secondary | ICD-10-CM | POA: Insufficient documentation

## 2013-09-05 DIAGNOSIS — Z7901 Long term (current) use of anticoagulants: Secondary | ICD-10-CM | POA: Insufficient documentation

## 2013-09-05 DIAGNOSIS — Z9221 Personal history of antineoplastic chemotherapy: Secondary | ICD-10-CM | POA: Insufficient documentation

## 2013-09-05 DIAGNOSIS — Z9010 Acquired absence of unspecified breast and nipple: Secondary | ICD-10-CM | POA: Insufficient documentation

## 2013-09-05 LAB — CBC
HCT: 42.3 % (ref 36.0–46.0)
HEMOGLOBIN: 15.2 g/dL — AB (ref 12.0–15.0)
MCH: 33.4 pg (ref 26.0–34.0)
MCHC: 35.9 g/dL (ref 30.0–36.0)
MCV: 93 fL (ref 78.0–100.0)
PLATELETS: 140 10*3/uL — AB (ref 150–400)
RBC: 4.55 MIL/uL (ref 3.87–5.11)
RDW: 12.3 % (ref 11.5–15.5)
WBC: 6.4 10*3/uL (ref 4.0–10.5)

## 2013-09-05 LAB — APTT: aPTT: 26 seconds (ref 24–37)

## 2013-09-05 LAB — PROTIME-INR
INR: 1.09 (ref 0.00–1.49)
Prothrombin Time: 13.9 seconds (ref 11.6–15.2)

## 2013-09-05 MED ORDER — FENTANYL CITRATE 0.05 MG/ML IJ SOLN
INTRAMUSCULAR | Status: AC
  Filled 2013-09-05: qty 2

## 2013-09-05 MED ORDER — CLINDAMYCIN PHOSPHATE 600 MG/50ML IV SOLN
600.0000 mg | Freq: Once | INTRAVENOUS | Status: AC
  Administered 2013-09-05: 600 mg via INTRAVENOUS
  Filled 2013-09-05: qty 50

## 2013-09-05 MED ORDER — MIDAZOLAM HCL 2 MG/2ML IJ SOLN
INTRAMUSCULAR | Status: AC | PRN
  Administered 2013-09-05: 2 mg via INTRAVENOUS

## 2013-09-05 MED ORDER — FENTANYL CITRATE 0.05 MG/ML IJ SOLN
INTRAMUSCULAR | Status: AC | PRN
  Administered 2013-09-05: 100 ug via INTRAVENOUS

## 2013-09-05 MED ORDER — SODIUM CHLORIDE 0.9 % IV SOLN
INTRAVENOUS | Status: DC
  Administered 2013-09-05: 13:00:00 via INTRAVENOUS

## 2013-09-05 MED ORDER — MIDAZOLAM HCL 2 MG/2ML IJ SOLN
INTRAMUSCULAR | Status: AC
  Filled 2013-09-05: qty 2

## 2013-09-05 MED ORDER — LIDOCAINE HCL 1 % IJ SOLN
INTRAMUSCULAR | Status: AC
  Filled 2013-09-05: qty 20

## 2013-09-05 NOTE — H&P (Signed)
Chief Complaint: "I'm here for port removal" Referring Physician:[name] HPI: Crystal [name] is an 38 y.o. female with hx of breast cancer. She has completed all her therapies and is ready for port removal, originally placed by her breast surgeon in 2012. She did suffer a left UE DVT and has been on Coumadin. Dr. Jana Hakim has set her up for port removal and she has discontinued the Coumadin PMHx and meds reviewed.  Past Medical History:  Past Medical History  Diagnosis Date  . Status post chemotherapy     docetaxel/carboplatin/trastuzumab  . Anxiety   . Hx MRSA infection 03/28/11    Skin  . History of breast cancer DX JUNE 2012 W/ RIGHT BREAST INVASIVE DUCTAL CA  STAGE IIB---  S/P BILATERAL MASTECTOMIES AND RIGHT NODE DISSECTION    ONCOLOGIST-- DR Jana Hakim--  CHEMO ENDED [place] 2012 / RADIATION ENDED MAR 2013--  CURRENT ON TAMOXIFEN AND ZOLADEX  . Family history of anesthesia complication     MOTHER PONV  . Hot flashes due to tamoxifen   . History of idiopathic seizure     DX 2008 --  LAST ONE 2012--  NO ISSUES SINCE AND NO MEDS. -- PT STATES DOCTORS FELT IT WAS STRESS RELATED DUE TO HEALTH ISSUES  . Bipolar affective disorder, mixed   . GERD (gastroesophageal reflux disease)     OCCASIONALLY TAKE ZANTAC  . Left ovarian cyst   . Right lower quadrant abdominal pain   . Frequency of urination   . Urgency of urination   . Strains to urinate   . Nocturia   . Heart palpitations   . Cancer   . Depression   . Seizures     Past Surgical History:  Past Surgical History  Procedure Laterality Date  . Wisdom tooth extraction    . Biopsy breast  10/14/2010    right needle core biopsy  . Portacath placement  11/16/2010    placement of left subclavian port  . I&d extremity  09/26/2011    Procedure: IRRIGATION AND DEBRIDEMENT EXTREMITY;  Surgeon: Tennis Must, MD;  Location: [facility];  Service: Orthopedics;  Laterality: Right;  I&D right hand cat bite wound  . Breast surgery  11/16/2010   bilateral mastectomy+ right axillary node dissection,T1cN1a, Her2+,ERPR+  (right breast invasive ductal carcinoma)  . Transthoracic echocardiogram  05-28-2012    LVF NORMAL/  EF 55-60%  . Cysto with hydrodistension N/A 07/23/2012    Procedure: CYSTOSCOPY/HYDRODISTENSION instillation of marcaine and pyridium;  Surgeon: Reece Packer, MD;  Location: [place];  Service: Urology;  Laterality: N/A;  INSTILLATION      Family History:  Family History  Problem Relation Age of Onset  . Cancer Maternal Grandfather   . Mental illness Brother   . Mental illness Son     Social History:  reports that she has been smoking Cigarettes.  She has a 20 pack-year smoking history. She has never used smokeless tobacco. She reports that she drinks alcohol. She reports that she does not use illicit drugs.  Allergies:  Allergies  Allergen Reactions  . Tramadol Other (See Comments)    Seizures  . Phenytoin Nausea And Vomiting  . Thorazine [Chlorpromazine Hcl] Hives  . Ciprofloxacin Nausea And Vomiting  . Vancomycin Rash    Possible rash reported by MD    Medications:   Medication List    ASK your doctor about these medications       ALPRAZolam 1 MG tablet  Commonly known as:  XANAX  Take 1 mg by mouth 3 (three) times daily as needed for anxiety.     carvedilol 6.25 MG tablet  Commonly known as:  COREG  Take 6.25 mg by mouth 2 (two) times daily as needed (rapid heartbeat).     citalopram 20 MG tablet  Commonly known as:  CELEXA  Take 40 mg by mouth daily after lunch.     goserelin 10.8 MG injection  Commonly known as:  ZOLADEX  Inject 10.8 mg into the skin every 3 (three) months.     ranitidine 150 MG tablet  Commonly known as:  ZANTAC  Take 150 mg by mouth 2 (two) times daily as needed for heartburn.     topiramate 50 MG tablet  Commonly known as:  TOPAMAX  Take 2 tablets (100 mg total) by mouth 2 (two) times daily.     warfarin 5 MG tablet  Commonly known as:   COUMADIN  Take 7.5 mg by mouth every evening.        Please HPI for pertinent positives, otherwise complete 10 system ROS negative.  Physical Exam: BP 120/85  Pulse 79  Temp(Src) 98.4 F (36.9 C) (Oral)  Resp 18  Ht 5' 4.5" (1.638 m)  Wt 136 lb (61.689 kg)  BMI 22.99 kg/m2  SpO2 100% Body mass index is 22.99 kg/(m^2).   General Appearance:  Alert, cooperative, no distress, appears stated age  Head:  Normocephalic, without obvious abnormality, atraumatic  ENT: Unremarkable  Neck: Supple, symmetrical, trachea midline  Lungs:   Clear to auscultation bilaterally, no w/r/r  Chest Wall:  No tenderness or deformity. (L)port intact, several venous collaterals noted  Heart:  Regular rate and rhythm, S1, S2 normal, no murmur, rub or gallop.  Abdomen:   Soft, non-tender, non distended.  Extremities: Extremities normal. No edema  Neurologic: Normal affect, no gross deficits.   Results for orders placed during the hospital encounter of 09/05/13 (from the past 48 hour(s))  APTT     Status: None   Collection Time    09/05/13 12:50 PM      Result Value Ref Range   aPTT 26  24 - 37 seconds  CBC     Status: Abnormal   Collection Time    09/05/13 12:50 PM      Result Value Ref Range   WBC 6.4  4.0 - 10.5 K/uL   RBC 4.55  3.87 - 5.11 MIL/uL   Hemoglobin 15.2 (*) 12.0 - 15.0 g/dL   HCT 42.3  36.0 - 46.0 %   MCV 93.0  78.0 - 100.0 fL   MCH 33.4  26.0 - 34.0 pg   MCHC 35.9  30.0 - 36.0 g/dL   RDW 12.3  11.5 - 15.5 %   Platelets 140 (*) 150 - 400 K/uL  PROTIME-INR     Status: None   Collection Time    09/05/13 12:50 PM      Result Value Ref Range   Prothrombin Time 13.9  11.6 - 15.2 seconds   INR 1.09  0.00 - 1.49   No results found.  Assessment/Plan Hx of breast cancer, treatment complete. For port removal. Discussed procedure, risks, complications, use of sedation. Labs reviewed. Consent signed in chart  Ascencion Dike PA-C 09/05/2013, 1:46 PM

## 2013-09-05 NOTE — Procedures (Signed)
L Port removal  No complication No blood loss. See complete dictation in [facility].

## 2013-09-05 NOTE — Discharge Instructions (Signed)
Moderate Sedation, Adult Moderate sedation is given to help you relax or even sleep through a procedure. You may remain sleepy, be clumsy, or have poor balance for several hours following this procedure. Arrange for a responsible adult, family member, or friend to take you home. A responsible adult should stay with you for at least 24 hours or until the medicines have worn off.  Do not participate in any activities where you could become injured for the next 24 hours, or until you feel normal again. Do not:  Drive.  Swim.  Ride a bicycle.  Operate heavy machinery.  Cook.  Use power tools.  Climb ladders.  Work at [employer].  Do not make important decisions or sign legal documents until you are improved.  Vomiting may occur if you eat too soon. When you can drink without vomiting, try water, juice, or soup. Try solid foods if you feel little or no nausea.  Only take over-the-counter or prescription medications for pain, discomfort, or fever as directed by your caregiver.If pain medications have been prescribed for you, ask your caregiver how soon it is safe to take them.  Make sure you and your family fully understands everything about the medication given to you. Make sure you understand what side effects may occur.  You should not drink alcohol, take sleeping pills, or medications that cause drowsiness for at least 24 hours.  If you smoke, do not smoke alone.  If you are feeling better, you may resume normal activities 24 hours after receiving sedation.  Keep all appointments as scheduled. Follow all instructions.  Ask questions if you do not understand. SEEK MEDICAL CARE IF:   Your skin is pale or bluish in color.  You continue to feel sick to your stomach (nauseous) or throw up (vomit).  Your pain is getting worse and not helped by medication.  You have bleeding or swelling.  You are still sleepy or feeling clumsy after 24 hours. SEEK IMMEDIATE MEDICAL CARE IF:    You develop a rash.  You have difficulty breathing.  You develop any type of allergic problem.  You have a fever. Document Released: 01/11/2001 Document Revised: 07/11/2011 Document Reviewed: 12/24/2012 [facility] Patient Information 2014 [employer]. Incision Care  An incision is a surgical cut to open your skin. You need to take care of your incision. This helps you to not get an infection. HOME CARE  Only take medicine as told by your doctor.  Do not take off your bandage (dressing) or get your incision wet until your doctor approves. Change the bandage and call your doctor if the bandage gets wet, dirty, or starts to smell.  Take showers. Do not take baths, swim, or do anything that may soak your incision until it heals.  Return to your normal diet and activities as told or allowed by your doctor.  Avoid lifting any weight until your doctor approves.  Put medicine that helps lessen itching on your incision as told by your doctor. Do not pick or scratch at your incision.  Keep your doctor visit to have your stitches (sutures) or staples removed.  Drink enough fluids to keep your pee (urine) clear or pale yellow. GET HELP RIGHT AWAY IF:  You have a fever.  You have a rash.  You are dizzy, or you pass out (faint) while standing.  You have trouble breathing.  You have a reaction or side effects to medicine given to you.  You have redness, puffiness (swelling), or more pain in  the incision and medicine does not help. °· You have fluid, blood, or yellowish-white fluid (pus) coming from the incision lasting over 1 day. °· You have muscle aches, chills, or you feel sick. °· You have a bad smell coming from the incision or bandage. °· Your incision opens up after stitches, staples, or sticky strips have been removed. °· You keep feeling sick to your stomach (nauseous) or keep throwing up (vomiting). °MAKE SURE YOU:  °· Understand these instructions. °· Will watch your  condition. °· Will get help right away if you are not doing well or get worse. °Document Released: 07/11/2011 Document Reviewed: 07/11/2011 °Exit[employer]® Patient Information ©2014 [employer]. ° °

## 2013-09-06 ENCOUNTER — Encounter: Payer: Self-pay | Admitting: *Deleted

## 2013-09-06 NOTE — Progress Notes (Signed)
Crystal [name]  Clinical Social [name] was referred by Futures trader for information counseling and referral to GYN.  Clinical Social Worker contacted patient at home to offer support and assess for needs.  Pt stated she did not feel her and her husband were "ready for counseling", but was interested in other support programs.  CSW shared information on support programs at [facility].  Pt expressed interest in support group and stated she planned to attend.  Pt also expressed interest in participating in the next [facility] class.  CSW provided contact information and encouraged her to call with any questions or concerns.    Crystal [name], MSW, [place] Worker [facility] [phone]

## 2013-10-01 ENCOUNTER — Telehealth: Payer: Self-pay | Admitting: Physician Assistant

## 2013-10-01 NOTE — Telephone Encounter (Signed)
pt cld to seee if she can see AB this month. Having a problem and req that AB see her. Sch for 6/24-adv pt of time-pt understood

## 2013-10-10 ENCOUNTER — Telehealth: Payer: Self-pay | Admitting: Physician Assistant

## 2013-10-10 NOTE — Telephone Encounter (Signed)
pt cld to CX appt woth AB-no reason given no r/s req

## 2013-10-11 ENCOUNTER — Other Ambulatory Visit: Payer: Self-pay | Admitting: Physician Assistant

## 2013-10-23 ENCOUNTER — Ambulatory Visit: Payer: Self-pay | Admitting: Physician Assistant

## 2013-10-29 ENCOUNTER — Other Ambulatory Visit: Payer: Self-pay | Admitting: Family Medicine

## 2013-10-29 DIAGNOSIS — F411 Generalized anxiety disorder: Secondary | ICD-10-CM

## 2013-10-30 NOTE — Telephone Encounter (Signed)
It seems I did not give her any RF with her last rx.  We will call this in for her  Meds ordered this encounter  Medications  . ALPRAZolam (XANAX) 1 MG tablet    Sig: TAKE ONE TABLET BY MOUTH THREE TIMES DAILY FOR ANXIETY     Dispense:  90 tablet    Refill:  1

## 2013-11-15 ENCOUNTER — Encounter: Payer: Self-pay | Admitting: Oncology

## 2013-11-15 NOTE — Progress Notes (Signed)
Patient called and inquired about cost for Zoladex(she gets every 90days) and labs. She had billing questions also and I referred her back to billing.

## 2013-12-03 ENCOUNTER — Ambulatory Visit: Payer: Self-pay | Admitting: Nutrition

## 2013-12-03 ENCOUNTER — Ambulatory Visit (HOSPITAL_BASED_OUTPATIENT_CLINIC_OR_DEPARTMENT_OTHER): Payer: Self-pay

## 2013-12-03 ENCOUNTER — Other Ambulatory Visit (HOSPITAL_BASED_OUTPATIENT_CLINIC_OR_DEPARTMENT_OTHER): Payer: Self-pay

## 2013-12-03 ENCOUNTER — Telehealth: Payer: Self-pay | Admitting: Oncology

## 2013-12-03 ENCOUNTER — Ambulatory Visit (HOSPITAL_BASED_OUTPATIENT_CLINIC_OR_DEPARTMENT_OTHER): Payer: Self-pay | Admitting: Oncology

## 2013-12-03 VITALS — BP 100/83 | HR 70 | Temp 98.6°F | Resp 18 | Ht 64.5 in | Wt 120.7 lb

## 2013-12-03 DIAGNOSIS — I82629 Acute embolism and thrombosis of deep veins of unspecified upper extremity: Secondary | ICD-10-CM

## 2013-12-03 DIAGNOSIS — C50411 Malignant neoplasm of upper-outer quadrant of right female breast: Secondary | ICD-10-CM

## 2013-12-03 DIAGNOSIS — C50419 Malignant neoplasm of upper-outer quadrant of unspecified female breast: Secondary | ICD-10-CM

## 2013-12-03 DIAGNOSIS — G40909 Epilepsy, unspecified, not intractable, without status epilepticus: Secondary | ICD-10-CM

## 2013-12-03 DIAGNOSIS — I427 Cardiomyopathy due to drug and external agent: Secondary | ICD-10-CM

## 2013-12-03 DIAGNOSIS — G43109 Migraine with aura, not intractable, without status migrainosus: Secondary | ICD-10-CM

## 2013-12-03 DIAGNOSIS — R51 Headache: Secondary | ICD-10-CM

## 2013-12-03 DIAGNOSIS — Z86718 Personal history of other venous thrombosis and embolism: Secondary | ICD-10-CM

## 2013-12-03 DIAGNOSIS — M2550 Pain in unspecified joint: Secondary | ICD-10-CM

## 2013-12-03 DIAGNOSIS — C773 Secondary and unspecified malignant neoplasm of axilla and upper limb lymph nodes: Secondary | ICD-10-CM

## 2013-12-03 DIAGNOSIS — G8929 Other chronic pain: Secondary | ICD-10-CM

## 2013-12-03 DIAGNOSIS — Z170 Estrogen receptor positive status [ER+]: Secondary | ICD-10-CM

## 2013-12-03 DIAGNOSIS — T451X5A Adverse effect of antineoplastic and immunosuppressive drugs, initial encounter: Secondary | ICD-10-CM

## 2013-12-03 DIAGNOSIS — R634 Abnormal weight loss: Secondary | ICD-10-CM

## 2013-12-03 DIAGNOSIS — E46 Unspecified protein-calorie malnutrition: Secondary | ICD-10-CM

## 2013-12-03 DIAGNOSIS — R569 Unspecified convulsions: Secondary | ICD-10-CM

## 2013-12-03 LAB — COMPREHENSIVE METABOLIC PANEL (CC13)
ALK PHOS: 95 U/L (ref 40–150)
ALT: 22 U/L (ref 0–55)
ANION GAP: 11 meq/L (ref 3–11)
AST: 30 U/L (ref 5–34)
Albumin: 4.3 g/dL (ref 3.5–5.0)
BILIRUBIN TOTAL: 0.45 mg/dL (ref 0.20–1.20)
BUN: 9.5 mg/dL (ref 7.0–26.0)
CO2: 21 meq/L — AB (ref 22–29)
Calcium: 9.9 mg/dL (ref 8.4–10.4)
Chloride: 109 mEq/L (ref 98–109)
Creatinine: 0.9 mg/dL (ref 0.6–1.1)
GLUCOSE: 92 mg/dL (ref 70–140)
Potassium: 3.5 mEq/L (ref 3.5–5.1)
Sodium: 142 mEq/L (ref 136–145)
Total Protein: 7.2 g/dL (ref 6.4–8.3)

## 2013-12-03 LAB — CBC WITH DIFFERENTIAL/PLATELET
BASO%: 0.2 % (ref 0.0–2.0)
BASOS ABS: 0 10*3/uL (ref 0.0–0.1)
EOS ABS: 0.1 10*3/uL (ref 0.0–0.5)
EOS%: 0.9 % (ref 0.0–7.0)
HEMATOCRIT: 48.2 % — AB (ref 34.8–46.6)
HEMOGLOBIN: 16.3 g/dL — AB (ref 11.6–15.9)
LYMPH%: 15.8 % (ref 14.0–49.7)
MCH: 33.3 pg (ref 25.1–34.0)
MCHC: 33.9 g/dL (ref 31.5–36.0)
MCV: 98.2 fL (ref 79.5–101.0)
MONO#: 0.4 10*3/uL (ref 0.1–0.9)
MONO%: 4.6 % (ref 0.0–14.0)
NEUT%: 78.5 % — ABNORMAL HIGH (ref 38.4–76.8)
NEUTROS ABS: 6.2 10*3/uL (ref 1.5–6.5)
PLATELETS: 163 10*3/uL (ref 145–400)
RBC: 4.91 10*6/uL (ref 3.70–5.45)
RDW: 13.4 % (ref 11.2–14.5)
WBC: 7.9 10*3/uL (ref 3.9–10.3)
lymph#: 1.3 10*3/uL (ref 0.9–3.3)

## 2013-12-03 LAB — TSH CHCC: TSH: 0.813 m[IU]/L (ref 0.308–3.960)

## 2013-12-03 MED ORDER — LETROZOLE 2.5 MG PO TABS: 2.5000 mg | ORAL_TABLET | Freq: Every day | ORAL | Status: DC

## 2013-12-03 NOTE — Progress Notes (Signed)
ID: Crystal [name]   DOB: [birth year]  MR#: [id number]  TMA#:[id number]  YBW:LSLHTDS,KAJGOTL, MD GYN: SULorine Bears, MD OTHER:  Elmarie Mainland   CHIEF COMPLAINT:  Hx of Right Breast Cancer CURRENT THERAPY: Goserelin goserelin   BREAST CANCER HISTORY: From the initial intake note:  The patient is a 38 year old [name] who noted a mass in her right breast and brought it to her primary care physician's attention June of 2012. She was set up for mammography at [facility], where an area of calcifications highly suspicious for carcinoma was biopsied. This was in the upper outer quadrant and it measured 1.7 cm mammographically and 1.8 cm by ultrasonography. The pathology report ([id number]) showed a high-grade invasive ductal carcinoma estrogen receptor positive at 64% progesterone receptor positive at 18%, and HER-2 amplified with a ratio by CISH of 5.24. MIB-1-1 was 65%. A second mass biopsied at the same time was also triple positive.  Given her multiple masses in the right breast, mastectomy was recommended. The patient actually opted for bilateral mastectomies and this was performed together with a right axillary lymph node dissection under Dr. Star Age on 11/16/2010. The final pathology [id number]) showed, on the left, no evidence of cancer. On the right the patient had a high-grade invasive ductal carcinoma measuring 1.9 cm, with negative margins, grade 3, involving 2 of 16 lymph nodes (stage IIB).   Her subsequent history is as detailed below.  INTERVAL HISTORY:  Crystal [name] returns today for followup of her breast cancer. She was supposed to have had her goserelin dosed in June, but for some reason that did not happen. She would like to receive it today, but I think we will need to do a pregnancy test first and that means we will have to put that treatment of until tomorrow   REVIEW OF SYSTEMS: Crystal [name]  drastically changed her diet in the last several months and basically  only eats vegetables and fruits and some headaches and some diarrhea. She has lost 25 pounds in the last 7 months. She denies purging. She denies anybody image problems and feels her current weight is fine and she would not want to lose any more weight. Her migraines are moderately well-controlled on her Topamax and she has not had any recent seizures. She continues to have significant pains in her joints and back, which are not more intense or persistent than before. She is concerned about some skin changes she wanted me to look at. She denies worsening depression or anxiety. She is sleeping "okay". Otherwise a detailed review of systems today was stable   PAST MEDICAL HISTORY: Past Medical History  Diagnosis Date  . Status post chemotherapy     docetaxel/carboplatin/trastuzumab  . Anxiety   . Hx MRSA infection 03/28/11    Skin  . History of breast cancer DX JUNE 2012 W/ RIGHT BREAST INVASIVE DUCTAL CA  STAGE IIB---  S/P BILATERAL MASTECTOMIES AND RIGHT NODE DISSECTION    ONCOLOGIST-- DR Jana Hakim--  CHEMO ENDED [place] 2012 / RADIATION ENDED MAR 2013--  CURRENT ON TAMOXIFEN AND ZOLADEX  . Family history of anesthesia complication     MOTHER PONV  . Hot flashes due to tamoxifen   . History of idiopathic seizure     DX 2008 --  LAST ONE 2012--  NO ISSUES SINCE AND NO MEDS. -- PT STATES DOCTORS FELT IT WAS STRESS RELATED DUE TO HEALTH ISSUES  . Bipolar affective disorder, mixed   . GERD (gastroesophageal reflux disease)  OCCASIONALLY TAKE ZANTAC  . Left ovarian cyst   . Right lower quadrant abdominal pain   . Frequency of urination   . Urgency of urination   . Strains to urinate   . Nocturia   . Heart palpitations   . Cancer   . Depression   . Seizures   1. History of seizures. The patient has been thoroughly evaluated both here by Dr. Gaynell Face and at [facility] for this. Among many studies, she had an MRI of the brain on August 28th. This showed an incidental solitary small lesion in the  left frontoparietal white matter. It was not felt to be suggestive of multiple sclerosis. A noncontrasted CT in August 2010 was negative. The patient has been off all seizure medications now for 2 months. There has been no evidence of seizure recurrence. 2. Complex psychology history (please refer to Dr. Iona Coach note in the E-chart from August 29, 2009) with evidence of anxiety disorder, depression and psychosis. The patient is currently on no medications for this and denies any of these symptoms at present. 3. History of migraines. 4. Remote history of multidrug abuse. 5. Status post wisdom teeth extraction.         History of MRSA skin infection   PAST SURGICAL HISTORY: Past Surgical History  Procedure Laterality Date  . Wisdom tooth extraction    . Biopsy breast  10/14/2010    right needle core biopsy  . Portacath placement  11/16/2010    placement of left subclavian port  . I&d extremity  09/26/2011    Procedure: IRRIGATION AND DEBRIDEMENT EXTREMITY;  Surgeon: Tennis Must, MD;  Location: [facility];  Service: Orthopedics;  Laterality: Right;  I&D right hand cat bite wound  . Breast surgery  11/16/2010    bilateral mastectomy+ right axillary node dissection,T1cN1a, Her2+,ERPR+  (right breast invasive ductal carcinoma)  . Transthoracic echocardiogram  05-28-2012    LVF NORMAL/  EF 55-60%  . Cysto with hydrodistension N/A 07/23/2012    Procedure: CYSTOSCOPY/HYDRODISTENSION instillation of marcaine and pyridium;  Surgeon: Reece Packer, MD;  Location: [facility];  Service: Urology;  Laterality: N/A;  INSTILLATION      FAMILY HISTORY Family History  Problem Relation Age of Onset  . Cancer Maternal Grandfather   . Mental illness Brother   . Mental illness Son   The patient's mother is living, in her mid 72s, with no history of breast or ovarian cancer. The patient does not know her biological father. The patient has an older brother with bipolar disease, and according to  the patient, borderline schizophrenia.   GYNECOLOGIC HISTORY: (Updated January 2015) Menarche at age 32. She is G1, P1 with first pregnancy to term at age 67. She was not menopausal, and was still having regular periods at the time of her breast cancer diagnosis. She continues on every three-month goserelin.  SOCIAL HISTORY:  (Updated 05/21/2013) She used to work in a [employer] but had to leave that job because of the seizure problem. She's currently unemployed. Her husband , Crystal [name], works as a Development worker, international aid. Crystal [name] has a son, Crystal [name], who is 59 and recently moved in with his father. Crystal [name], according to the patient, has significant nausea problems. The patient attends a local [place]. 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    ADVANCED DIRECTIVES: Not in place  HEALTH MAINTENANCE:  (Updated 05/21/2013) History  Substance Use Topics  . Smoking status: Current Every Day Smoker -- 1.00 packs/day for 20 years  Types: Cigarettes  . Smokeless tobacco: Never Used  . Alcohol Use: Yes     Colonoscopy: Never  PAP: July 2012  Bone density:  Never  Lipid panel: Not on file  Allergies  Allergen Reactions  . Tramadol Other (See Comments)    Seizures  . Phenytoin Nausea And Vomiting  . Thorazine [Chlorpromazine Hcl] Hives  . Ciprofloxacin Nausea And Vomiting  . Vancomycin Rash    Possible rash reported by MD    Current Outpatient Prescriptions  Medication Sig Dispense Refill  . ALPRAZolam (XANAX) 1 MG tablet TAKE ONE TABLET BY MOUTH THREE TIMES DAILY FOR ANXIETY   90 tablet  1  . carvedilol (COREG) 6.25 MG tablet Take 6.25 mg by mouth 2 (two) times daily as needed (rapid heartbeat).      . citalopram (CELEXA) 20 MG tablet Take 40 mg by mouth daily after lunch.      . citalopram (CELEXA) 20 MG tablet Take 2 tablets (40 mg total) by mouth daily. PATIENT NEEDS OFFICE VISIT FOR ADDITIONAL REFILLS  60 tablet  0  . goserelin (ZOLADEX) 10.8 MG injection Inject 10.8 mg into the skin every 3 (three)  months.      Marland Kitchen letrozole (FEMARA) 2.5 MG tablet Take 1 tablet (2.5 mg total) by mouth daily.  90 tablet  4  . ranitidine (ZANTAC) 150 MG tablet Take 150 mg by mouth 2 (two) times daily as needed for heartburn.      . topiramate (TOPAMAX) 50 MG tablet Take 2 tablets (100 mg total) by mouth 2 (two) times daily.  120 tablet  12  . warfarin (COUMADIN) 5 MG tablet Take 7.5 mg by mouth every evening.       No current facility-administered medications for this visit.   OBJECTIVE: Young white female w in no acute distress  Filed Vitals:   12/03/13 1415  BP: 100/83  Pulse: 70  Temp: 98.6 F (37 C)  Resp: 18  ECOG: 2 Body mass index is 20.41 kg/(m^2). Filed Weights   12/03/13 1415  Weight: 120 lb 11.2 oz (54.749 kg)   January 2015 weight was 146 pounds   Sclerae unicteric, pupils equal and reactive Oropharynx clear and moist-- no thrush No cervical or supraclavicular adenopathy Lungs no rales or rhonchi Heart regular rate and rhythm Abd soft, nontender, positive bowel sounds MSK no focal spinal tenderness, no upper extremity lymphedema Neuro: nonfocal, well oriented, appropriate affect Breasts: Status post bilateral mastectomies. There is no evidence of local recurrence. Both axillae are benign.  LAB RESULTS: Lab Results  Component Value Date   WBC 7.9 12/03/2013   NEUTROABS 6.2 12/03/2013   HGB 16.3* 12/03/2013   HCT 48.2* 12/03/2013   MCV 98.2 12/03/2013   PLT 163 12/03/2013      Chemistry      Component Value Date/Time   NA 142 12/03/2013 1337   NA 138 08/22/2012 1700   K 3.5 12/03/2013 1337   K 3.7 08/22/2012 1700   CL 105 10/11/2012 1441   CL 103 08/22/2012 1700   CO2 21* 12/03/2013 1337   CO2 23 08/22/2012 1700   BUN 9.5 12/03/2013 1337   BUN 11 08/22/2012 1700   CREATININE 0.9 12/03/2013 1337   CREATININE 0.96 08/22/2012 1700      Component Value Date/Time   CALCIUM 9.9 12/03/2013 1337   CALCIUM 9.1 08/22/2012 1700   ALKPHOS 95 12/03/2013 1337   ALKPHOS 87 08/22/2012 1700   AST 30 12/03/2013  1337   AST 33  08/22/2012 1700   ALT 22 12/03/2013 1337   ALT 24 08/22/2012 1700   BILITOT 0.45 12/03/2013 1337   BILITOT 0.2* 08/22/2012 1700         STUDIES: No results found.  ASSESSMENT: 38 y.o.  [place] woman, known to be BRCA1 and 2 negative,   (1) status post bilateral mastectomies May of 2012 for a right-sided T1c N1 (stage II) grade 3 invasive ductal carcinoma,estrogen and progesterone receptor positive, HER-2 amplified, with an MIB-1-1 of 85%.   (2) status post carboplatin/ docetaxol/ trastuzumab x6, completed mid December 2012   (3) on trastuzumab every 3 weeks, started August of 2012, interrupted between April and August of 2013, completed 04/27/2012. Most recent echo 03/19/2012  (4) status post radiation therapy, completed 07/12/2011  (5) On tamoxifen starting April 2013, held as of 12/27/2012 with the diagnosis of a left upper extremity DVT.  On therapeutic Coumadin until  May 2015  (6) On goserelin given every 3 months, 1st injection on 10/26/11, most recent dose 04/29/2013.   (7) started on anastrozole November 2014, discontinued January 2015 because of side effects  Other problems:  (1) Chronic Pain/ fibromyalgia  (2) history of seizure disorder   (3) history of severe migraines   (4) calorie malnutrition, 25 pound weight loss over 7 months    (5) left upper extremity DVT, port associated, documented age 68 12/19/2012; port removed 09/05/2013 and coumadin discontinued at that time   PLAN:   I. am concerned about the rapid weight loss. It could all be died, of course. I do not find evidence for an altered body image and Liberta denies purging or vomiting. I am asking her to meet with our nutritionist, Ernestene Kiel today, to discuss an appropriate diet. I am also setting her up for weight checks once a month for the next 3 months. I am also checking a TSH today.  Lenni has been almost 6 weeks out beyond her scheduled Zoladex dose. I want to make sure she is  not pregnant before proceeding with further doses. She will give [place] a urine sample today and assuming it is negative she will receive Zoladex tomorrow and then again 12 weeks from now.  She was not able to complete 5 years of anti-estrogens. She tolerated the tamoxifen well, but she did have a clot while on tamoxifen. She did not tolerate the anastrozole. Today we discussed all the options and decided she would give letrozole a try. I went ahead and put that prescription in. She will call with any problems that may develop from that medication.   Otherwise the plan is to continue goserelin every 3 months indefinitely. She will see me again in October, with her next dose. She knows to call for any problems that may develop before that visit    12/03/2013     Chauncey Cruel, MD

## 2013-12-03 NOTE — Progress Notes (Signed)
38 year old female who has completed chemotherapy for breast cancer diagnosed in 2012.  She is a patient of Dr. Jana Hakim.  Past medical history includes idiopathic seizures, bipolar, GERD, and depression.  Medications include Xanax, Celexa, Zoladex, Zantac, and Coumadin.  Labs include albumin of 4.3.  TSH is pending.  Height: 64.5 inches. Weight: 120.7 pounds. Usual body weight: 146 pounds January 2015. BMI: 20.41.  Patient reports her diet has changed dramatically since her cancer diagnosis.  She typically consumes vegetables and some fish.  She does not consume any fruits or dairy except for cheese and eggs.  She eats a few complex carbohydrates such as Quinoa and rice.  Patient refuses extra fat in her diet but will add nuts and some olive oil.  Patient is positive for 17% weight loss over 7 months.  Dietary recall reveals patient consuming very low calorie diet.  Patient reports she is happy at present weight.  She states she is willing to maintain present weight.  She verbalizes how her husband has stated she is too thin.  Patient meets criteria for severe malnutrition in the context of chronic illness secondary to 17% weight loss in seven months and less than 75% energy intake for greater than one month.  Patient also has severe depletion of body fat and muscle mass.  Nutrition diagnosis: Malnutrition related to inadequate oral intake as evidenced by 17% weight loss in 7 months, estimated energy intake from diet less than estimated needs, and patient's food avoidance after cancer as diagnosis.  Intervention: Educated patient she could consume a healthy vegetarian diet (plus fish) with extra calories to minimize further weight loss. Educated patient to consume snacks between meals and offered suggestions.   Recommended patient try morning smoothie made with ingredients she will accept. Provided fact sheets on healthy diet. Questions were answered and teach back method used. M.D. to  monitor monthly weight.  Monitoring, evaluation, goals: Patient will increase oral intake to minimize further weight loss.  Next visit: Patient to contact me by phone for further questions or concerns.  **Disclaimer: This note was dictated with voice recognition software. Similar sounding words can inadvertently be transcribed and this note may contain transcription errors which may not have been corrected upon publication of note.**

## 2013-12-03 NOTE — Telephone Encounter (Signed)
, °

## 2013-12-04 ENCOUNTER — Ambulatory Visit (HOSPITAL_BASED_OUTPATIENT_CLINIC_OR_DEPARTMENT_OTHER): Payer: Self-pay

## 2013-12-04 VITALS — BP 124/91 | HR 81 | Temp 98.0°F

## 2013-12-04 DIAGNOSIS — Z5111 Encounter for antineoplastic chemotherapy: Secondary | ICD-10-CM

## 2013-12-04 DIAGNOSIS — C50419 Malignant neoplasm of upper-outer quadrant of unspecified female breast: Secondary | ICD-10-CM

## 2013-12-04 DIAGNOSIS — C50411 Malignant neoplasm of upper-outer quadrant of right female breast: Secondary | ICD-10-CM

## 2013-12-04 DIAGNOSIS — I82623 Acute embolism and thrombosis of deep veins of upper extremity, bilateral: Secondary | ICD-10-CM

## 2013-12-04 DIAGNOSIS — I82629 Acute embolism and thrombosis of deep veins of unspecified upper extremity: Secondary | ICD-10-CM

## 2013-12-04 DIAGNOSIS — C773 Secondary and unspecified malignant neoplasm of axilla and upper limb lymph nodes: Secondary | ICD-10-CM

## 2013-12-04 LAB — PREGNANCY, URINE: PREG TEST UR: NEGATIVE

## 2013-12-04 MED ORDER — GOSERELIN ACETATE 10.8 MG ~~LOC~~ IMPL
10.8000 mg | DRUG_IMPLANT | SUBCUTANEOUS | Status: DC
  Administered 2013-12-04: 10.8 mg via SUBCUTANEOUS
  Filled 2013-12-04: qty 10.8

## 2013-12-05 ENCOUNTER — Telehealth: Payer: Self-pay | Admitting: *Deleted

## 2013-12-05 NOTE — Telephone Encounter (Signed)
Received voice message from patient wanting lab results from 8/4. Left message that labs were normal and to call if she had any questions.

## 2013-12-09 NOTE — Telephone Encounter (Signed)
Phone call - encounter closed. 

## 2013-12-17 ENCOUNTER — Other Ambulatory Visit: Payer: Self-pay | Admitting: Family Medicine

## 2013-12-30 ENCOUNTER — Ambulatory Visit (INDEPENDENT_AMBULATORY_CARE_PROVIDER_SITE_OTHER): Payer: Self-pay | Admitting: Family Medicine

## 2013-12-30 VITALS — BP 112/74 | HR 83 | Temp 97.8°F | Resp 16 | Ht 65.0 in | Wt 116.4 lb

## 2013-12-30 DIAGNOSIS — F32A Depression, unspecified: Secondary | ICD-10-CM

## 2013-12-30 DIAGNOSIS — F329 Major depressive disorder, single episode, unspecified: Secondary | ICD-10-CM

## 2013-12-30 DIAGNOSIS — R197 Diarrhea, unspecified: Secondary | ICD-10-CM

## 2013-12-30 DIAGNOSIS — F411 Generalized anxiety disorder: Secondary | ICD-10-CM

## 2013-12-30 DIAGNOSIS — F3289 Other specified depressive episodes: Secondary | ICD-10-CM

## 2013-12-30 MED ORDER — CITALOPRAM HYDROBROMIDE 40 MG PO TABS: 40.0000 mg | ORAL_TABLET | Freq: Every day | ORAL | Status: DC

## 2013-12-30 MED ORDER — ALPRAZOLAM 1 MG PO TABS: ORAL_TABLET | ORAL | Status: DC

## 2013-12-30 NOTE — Progress Notes (Signed)
[facility] [address], [place] [address] [phone]  Date:  12/30/2013   Name:  Crystal [name]   DOB:  1975/07/07   MRN:  [id number]  PCP:  Lamar Blinks, MD    Chief Complaint: Medication Refill   History of Present Illness:  Crystal [name] is a 38 y.o. very pleasant female patient who presents with the following:  She would like refills today of her celexa and xanax.  She has noted some issues with her left ear- she feels like it is itchy and clogged.  She has tried some OTC remedies/ earwax removal products. Her breast cancer is still in remission.  She is using an anti-estrogen pill (femara) that she started about a month ago  Wt Readings from Last 3 Encounters:  12/30/13 116 lb 6.4 oz (52.799 kg)  12/03/13 120 lb 11.2 oz (54.749 kg)  09/05/13 136 lb (61.689 kg)   She also noted that she has had a lot of diarrhea especially since starting her Femara earlier this month- maybe longer. Her husband Glendell Docker interjects that her sx have probably gone on for a couple of months.  He is also concerned about her loss of weight, and notes that she does not seem to be eating enough.  She notes diarrhea more after eating. Glendell Docker has also noted diarrhea for the last few weeks   Patient Active Problem List   Diagnosis Date Noted  . Malnutrition, calorie 12/03/2013  . Migraines 05/21/2013  . Breast cancer of upper-outer quadrant of right female breast 03/21/2013  . Chronic pain 01/09/2013  . DVT of upper extremity (deep vein thrombosis) 01/02/2013  . Arm edema 12/27/2012  . Neuropathic pain 12/27/2012  . Chronic female pelvic pain 08/21/2012  . Fatigue 08/20/2012  . Chemotherapy induced cardiomyopathy 09/08/2011  . Generalized convulsive seizure 07/13/2011  . Seizure disorder 07/13/2011  . Migraine 07/13/2011  . Anxiety disorder 07/13/2011  . Depression 07/13/2011  . Hx MRSA infection 04/13/2011    Past Medical History  Diagnosis Date  . Status post  chemotherapy     docetaxel/carboplatin/trastuzumab  . Anxiety   . Hx MRSA infection 03/28/11    Skin  . History of breast cancer DX JUNE 2012 W/ RIGHT BREAST INVASIVE DUCTAL CA  STAGE IIB---  S/P BILATERAL MASTECTOMIES AND RIGHT NODE DISSECTION    ONCOLOGIST-- DR Jana Hakim--  CHEMO ENDED [place] 2012 / RADIATION ENDED MAR 2013--  CURRENT ON TAMOXIFEN AND ZOLADEX  . Family history of anesthesia complication     MOTHER PONV  . Hot flashes due to tamoxifen   . History of idiopathic seizure     DX 2008 --  LAST ONE 2012--  NO ISSUES SINCE AND NO MEDS. -- PT STATES DOCTORS FELT IT WAS STRESS RELATED DUE TO HEALTH ISSUES  . Bipolar affective disorder, mixed   . GERD (gastroesophageal reflux disease)     OCCASIONALLY TAKE ZANTAC  . Left ovarian cyst   . Right lower quadrant abdominal pain   . Frequency of urination   . Urgency of urination   . Strains to urinate   . Nocturia   . Heart palpitations   . Cancer   . Depression   . Seizures     Past Surgical History  Procedure Laterality Date  . Wisdom tooth extraction    . Biopsy breast  10/14/2010    right needle core biopsy  . Portacath placement  11/16/2010    placement of left subclavian port  .  I&d extremity  09/26/2011    Procedure: IRRIGATION AND DEBRIDEMENT EXTREMITY;  Surgeon: Tennis Must, MD;  Location: [facility];  Service: Orthopedics;  Laterality: Right;  I&D right hand cat bite wound  . Breast surgery  11/16/2010    bilateral mastectomy+ right axillary node dissection,T1cN1a, Her2+,ERPR+  (right breast invasive ductal carcinoma)  . Transthoracic echocardiogram  05-28-2012    LVF NORMAL/  EF 55-60%  . Cysto with hydrodistension N/A 07/23/2012    Procedure: CYSTOSCOPY/HYDRODISTENSION instillation of marcaine and pyridium;  Surgeon: Reece Packer, MD;  Location: [facility];  Service: Urology;  Laterality: N/A;  INSTILLATION      History  Substance Use Topics  . Smoking status: Current Every Day Smoker -- 1.00  packs/day for 20 years    Types: Cigarettes  . Smokeless tobacco: Never Used  . Alcohol Use: Yes    Family History  Problem Relation Age of Onset  . Cancer Maternal Grandfather   . Mental illness Brother   . Mental illness Son     Allergies  Allergen Reactions  . Tramadol Other (See Comments)    Seizures  . Phenytoin Nausea And Vomiting  . Thorazine [Chlorpromazine Hcl] Hives  . Ciprofloxacin Nausea And Vomiting  . Vancomycin Rash    Possible rash reported by MD    Medication list has been reviewed and updated.  Current Outpatient Prescriptions on File Prior to Visit  Medication Sig Dispense Refill  . ALPRAZolam (XANAX) 1 MG tablet TAKE ONE TABLET BY MOUTH THREE TIMES DAILY FOR ANXIETY   90 tablet  1  . carvedilol (COREG) 6.25 MG tablet Take 6.25 mg by mouth 2 (two) times daily as needed (rapid heartbeat).      . citalopram (CELEXA) 20 MG tablet Take 2 tablets (40 mg total) by mouth daily. NO MORE REFILLS WITHOUT OFFICE VISIT  30 tablet  0  . goserelin (ZOLADEX) 10.8 MG injection Inject 10.8 mg into the skin every 3 (three) months.      Marland Kitchen letrozole (FEMARA) 2.5 MG tablet Take 1 tablet (2.5 mg total) by mouth daily.  90 tablet  4  . topiramate (TOPAMAX) 50 MG tablet Take 2 tablets (100 mg total) by mouth 2 (two) times daily.  120 tablet  12   No current facility-administered medications on file prior to visit.    Review of Systems:  As per HPI- otherwise negative.   Physical Examination: Filed Vitals:   12/30/13 1408  BP: 112/74  Pulse: 83  Temp: 97.8 F (36.6 C)  Resp: 16   Filed Vitals:   12/30/13 1408  Height: '5\' 5"'  (1.651 m)  Weight: 116 lb 6.4 oz (52.799 kg)   Body mass index is 19.37 kg/(m^2). Ideal Body Weight: Weight in (lb) to have BMI = 25: 149.9  GEN: WDWN, NAD, Non-toxic, A & O x 3, looks at her baseline but has lost weight HEENT: Atraumatic, Normocephalic. Neck supple. No masses, No LAD. Ears and Nose: No external deformity. CV: RRR, No  M/G/R. No JVD. No thrill. No extra heart sounds. PULM: CTA B, no wheezes, crackles, rhonchi. No retractions. No resp. distress. No accessory muscle use. ABD: S, NT, ND, +BS. No rebound. No HSM. EXTR: No c/c/e NEURO Normal gait.  PSYCH: Normally interactive. Conversant. Not depressed or anxious appearing.  Calm demeanor.    Assessment and Plan: Depression - Plan: citalopram (CELEXA) 40 MG tablet  GAD (generalized anxiety disorder) - Plan: ALPRAZolam (XANAX) 1 MG tablet  Diarrhea - Plan: Ova  and parasite examination  Refilled her celexa and routine xanax for anxiety.  Will need to rule- out giardia as both she and her husband have noted sx.  However assuming this is negative she will continue to see her nutrionist and will also discuss possible SE of the femara with oncology. She will keep me posted as to her sx  Signed Lamar Blinks, MD

## 2013-12-30 NOTE — Patient Instructions (Addendum)
Please discuss your GI symptoms with Dr. Jana Hakim.  Also, discuss with your nutritionist to make sure you are getting enough calories.  If we do not find another solution we will need to have you see GI.  Try using some imodium OTC for diarrhea as needed.    I will be in touch with your stool results asap

## 2014-01-01 ENCOUNTER — Ambulatory Visit: Payer: Self-pay

## 2014-01-01 LAB — OVA AND PARASITE EXAMINATION: OP: NONE SEEN

## 2014-01-05 ENCOUNTER — Encounter: Payer: Self-pay | Admitting: Family Medicine

## 2014-01-07 ENCOUNTER — Telehealth: Payer: Self-pay | Admitting: Family Medicine

## 2014-01-07 NOTE — Telephone Encounter (Signed)
Called to check on her.   Discussed adding a c diff test.  This would be about $125 each for her and Glendell Docker.  She will check with Glendell Docker and let me know if they would like to have this done

## 2014-02-26 ENCOUNTER — Other Ambulatory Visit (HOSPITAL_BASED_OUTPATIENT_CLINIC_OR_DEPARTMENT_OTHER): Payer: Self-pay

## 2014-02-26 ENCOUNTER — Ambulatory Visit (HOSPITAL_BASED_OUTPATIENT_CLINIC_OR_DEPARTMENT_OTHER): Payer: Self-pay

## 2014-02-26 ENCOUNTER — Ambulatory Visit (HOSPITAL_BASED_OUTPATIENT_CLINIC_OR_DEPARTMENT_OTHER): Payer: Self-pay | Admitting: Oncology

## 2014-02-26 ENCOUNTER — Encounter: Payer: Self-pay | Admitting: Oncology

## 2014-02-26 ENCOUNTER — Telehealth: Payer: Self-pay | Admitting: Oncology

## 2014-02-26 VITALS — BP 128/96 | HR 91 | Temp 98.6°F | Resp 18 | Ht 65.0 in | Wt 112.1 lb

## 2014-02-26 DIAGNOSIS — Z170 Estrogen receptor positive status [ER+]: Secondary | ICD-10-CM

## 2014-02-26 DIAGNOSIS — C773 Secondary and unspecified malignant neoplasm of axilla and upper limb lymph nodes: Secondary | ICD-10-CM

## 2014-02-26 DIAGNOSIS — C50411 Malignant neoplasm of upper-outer quadrant of right female breast: Secondary | ICD-10-CM

## 2014-02-26 DIAGNOSIS — E46 Unspecified protein-calorie malnutrition: Secondary | ICD-10-CM

## 2014-02-26 DIAGNOSIS — I82402 Acute embolism and thrombosis of unspecified deep veins of left lower extremity: Secondary | ICD-10-CM

## 2014-02-26 DIAGNOSIS — Z5111 Encounter for antineoplastic chemotherapy: Secondary | ICD-10-CM

## 2014-02-26 DIAGNOSIS — I82629 Acute embolism and thrombosis of deep veins of unspecified upper extremity: Secondary | ICD-10-CM

## 2014-02-26 DIAGNOSIS — G40909 Epilepsy, unspecified, not intractable, without status epilepticus: Secondary | ICD-10-CM

## 2014-02-26 DIAGNOSIS — I82623 Acute embolism and thrombosis of deep veins of upper extremity, bilateral: Secondary | ICD-10-CM

## 2014-02-26 DIAGNOSIS — I82622 Acute embolism and thrombosis of deep veins of left upper extremity: Secondary | ICD-10-CM

## 2014-02-26 DIAGNOSIS — G8929 Other chronic pain: Secondary | ICD-10-CM

## 2014-02-26 DIAGNOSIS — G43909 Migraine, unspecified, not intractable, without status migrainosus: Secondary | ICD-10-CM

## 2014-02-26 LAB — CBC WITH DIFFERENTIAL/PLATELET
BASO%: 0.7 % (ref 0.0–2.0)
Basophils Absolute: 0 10*3/uL (ref 0.0–0.1)
EOS%: 1.7 % (ref 0.0–7.0)
Eosinophils Absolute: 0.1 10*3/uL (ref 0.0–0.5)
HCT: 46.4 % (ref 34.8–46.6)
HGB: 15.6 g/dL (ref 11.6–15.9)
LYMPH#: 1.1 10*3/uL (ref 0.9–3.3)
LYMPH%: 18 % (ref 14.0–49.7)
MCH: 33.2 pg (ref 25.1–34.0)
MCHC: 33.7 g/dL (ref 31.5–36.0)
MCV: 98.4 fL (ref 79.5–101.0)
MONO#: 0.3 10*3/uL (ref 0.1–0.9)
MONO%: 5.1 % (ref 0.0–14.0)
NEUT#: 4.7 10*3/uL (ref 1.5–6.5)
NEUT%: 74.5 % (ref 38.4–76.8)
Platelets: 178 10*3/uL (ref 145–400)
RBC: 4.72 10*6/uL (ref 3.70–5.45)
RDW: 12.9 % (ref 11.2–14.5)
WBC: 6.3 10*3/uL (ref 3.9–10.3)

## 2014-02-26 LAB — COMPREHENSIVE METABOLIC PANEL (CC13)
ALT: 26 U/L (ref 0–55)
AST: 29 U/L (ref 5–34)
Albumin: 4.4 g/dL (ref 3.5–5.0)
Alkaline Phosphatase: 97 U/L (ref 40–150)
Anion Gap: 10 mEq/L (ref 3–11)
BILIRUBIN TOTAL: 0.68 mg/dL (ref 0.20–1.20)
BUN: 11.5 mg/dL (ref 7.0–26.0)
CALCIUM: 9.6 mg/dL (ref 8.4–10.4)
CHLORIDE: 112 meq/L — AB (ref 98–109)
CO2: 18 mEq/L — ABNORMAL LOW (ref 22–29)
Creatinine: 1 mg/dL (ref 0.6–1.1)
Glucose: 91 mg/dl (ref 70–140)
Potassium: 3.7 mEq/L (ref 3.5–5.1)
Sodium: 140 mEq/L (ref 136–145)
Total Protein: 7 g/dL (ref 6.4–8.3)

## 2014-02-26 MED ORDER — GOSERELIN ACETATE 10.8 MG ~~LOC~~ IMPL
10.8000 mg | DRUG_IMPLANT | SUBCUTANEOUS | Status: DC
  Administered 2014-02-26: 10.8 mg via SUBCUTANEOUS
  Filled 2014-02-26: qty 10.8

## 2014-02-26 NOTE — Patient Instructions (Signed)
Goserelin injection What is this medicine? GOSERELIN (GOE se rel in) is similar to a hormone found in the body. It lowers the amount of sex hormones that the body makes. Men will have lower testosterone levels and women will have lower estrogen levels while taking this medicine. In men, this medicine is used to treat prostate cancer; the injection is either given once per month or once every 12 weeks. A once per month injection (only) is used to treat women with endometriosis, dysfunctional uterine bleeding, or advanced breast cancer. This medicine may be used for other purposes; ask your health care provider or pharmacist if you have questions. COMMON BRAND NAME(S): Zoladex What should I tell my health care provider before I take this medicine? They need to know if you have any of these conditions (some only apply to women): -diabetes -heart disease or previous heart attack -high blood pressure -high cholesterol -kidney disease -osteoporosis or low bone density -problems passing urine -spinal cord injury -stroke -tobacco smoker -an unusual or allergic reaction to goserelin, hormone therapy, other medicines, foods, dyes, or preservatives -pregnant or trying to get pregnant -breast-feeding How should I use this medicine? This medicine is for injection under the skin. It is given by a health care professional in a hospital or clinic setting. Men receive this injection once every 4 weeks or once every 12 weeks. Women will only receive the once every 4 weeks injection. Talk to your pediatrician regarding the use of this medicine in children. Special care may be needed. Overdosage: If you think you have taken too much of this medicine contact a poison control center or emergency room at once. NOTE: This medicine is only for you. Do not share this medicine with others. What if I miss a dose? It is important not to miss your dose. Call your doctor or health care professional if you are unable to  keep an appointment. What may interact with this medicine? -female hormones like estrogen -herbal or dietary supplements like black cohosh, chasteberry, or DHEA -female hormones like testosterone -prasterone This list may not describe all possible interactions. Give your health care provider a list of all the medicines, herbs, non-prescription drugs, or dietary supplements you use. Also tell them if you smoke, drink alcohol, or use illegal drugs. Some items may interact with your medicine. What should I watch for while using this medicine? Visit your doctor or health care professional for regular checks on your progress. Your symptoms may appear to get worse during the first weeks of this therapy. Tell your doctor or healthcare professional if your symptoms do not start to get better or if they get worse after this time. Your bones may get weaker if you take this medicine for a long time. If you smoke or frequently drink alcohol you may increase your risk of bone loss. A family history of osteoporosis, chronic use of drugs for seizures (convulsions), or corticosteroids can also increase your risk of bone loss. Talk to your doctor about how to keep your bones strong. This medicine should stop regular monthly menstration in women. Tell your doctor if you continue to menstrate. Women should not become pregnant while taking this medicine or for 12 weeks after stopping this medicine. Women should inform their doctor if they wish to become pregnant or think they might be pregnant. There is a potential for serious side effects to an unborn child. Talk to your health care professional or pharmacist for more information. Do not breast-feed an infant while taking   this medicine. Men should inform their doctors if they wish to father a child. This medicine may lower sperm counts. Talk to your health care professional or pharmacist for more information. What side effects may I notice from receiving this  medicine? Side effects that you should report to your doctor or health care professional as soon as possible: -allergic reactions like skin rash, itching or hives, swelling of the face, lips, or tongue -bone pain -breathing problems -changes in vision -chest pain -feeling faint or lightheaded, falls -fever, chills -pain, swelling, warmth in the leg -pain, tingling, numbness in the hands or feet -signs and symptoms of low blood pressure like dizziness; feeling faint or lightheaded, falls; unusually weak or tired -stomach pain -swelling of the ankles, feet, hands -trouble passing urine or change in the amount of urine -unusually high or low blood pressure -unusually weak or tired Side effects that usually do not require medical attention (report to your doctor or health care professional if they continue or are bothersome): -change in sex drive or performance -changes in breast size in both males and females -changes in emotions or moods -headache -hot flashes -irritation at site where injected -loss of appetite -skin problems like acne, dry skin -vaginal dryness This list may not describe all possible side effects. Call your doctor for medical advice about side effects. You may report side effects to FDA at 1-800-FDA-1088. Where should I keep my medicine? This drug is given in a hospital or clinic and will not be stored at home. NOTE: This sheet is a summary. It may not cover all possible information. If you have questions about this medicine, talk to your doctor, pharmacist, or health care provider.  2015, [employer]. (2013-06-25 11:10:35)  

## 2014-02-26 NOTE — Progress Notes (Signed)
ID: Crystal [name]   DOB: November 26, 1975  MR#: [id number]  HUD#:[id number]  CHY:IFOYDXA,JOINOMV, MD GYN: SULorine Bears, MD OTHER:  Elmarie Mainland   CHIEF COMPLAINT:  Hx of Right Breast Cancer CURRENT THERAPY: Goserelin goserelin   BREAST CANCER HISTORY: From the initial intake note:  The patient is a 38 year old Crystal [name] woman who noted a mass in her right breast and brought it to her primary care physician's attention June of 2012. She was set up for mammography at [facility], where an area of calcifications highly suspicious for carcinoma was biopsied. This was in the upper outer quadrant and it measured 1.7 cm mammographically and 1.8 cm by ultrasonography. The pathology report ([id number]) showed a high-grade invasive ductal carcinoma estrogen receptor positive at 64% progesterone receptor positive at 18%, and HER-2 amplified with a ratio by CISH of 5.24. MIB-1-1 was 65%. A second mass biopsied at the same time was also triple positive.  Given her multiple masses in the right breast, mastectomy was recommended. The patient actually opted for bilateral mastectomies and this was performed together with a right axillary lymph node dissection under Dr. Star Age on 11/16/2010. The final pathology [id number]) showed, on the left, no evidence of cancer. On the right the patient had a high-grade invasive ductal carcinoma measuring 1.9 cm, with negative margins, grade 3, involving 2 of 16 lymph nodes (stage IIB).   Her subsequent history is as detailed below.  INTERVAL HISTORY:  Crystal [name] returns today for followup of her breast cancer. Interval history is generally stable. She started letrozole at the last visit. She finds that it doesn't cause worsening aches or pains, or worsening vaginal dryness. She already had those problems and there are not any different. She thinks it may be making her headaches more intense. It does not make them more frequent.  REVIEW OF SYSTEMS: Crystal [name]  continues to eat a very restricted diet. Largely this is because of concerns regarding the way animals are treated. This is a very valid concern. However I am worried about the fact that she continues to lose weight. She gives me no symptoms suggestive of hypothyroidism. We checked a TSH on her in August and it was fine. This needs to be followed. She denies constipation, hair loss, or worsening fatigue although she is fatigued all the time "anyway". A detailed review of systems today was stable  PAST MEDICAL HISTORY: Past Medical History  Diagnosis Date  . Status post chemotherapy     docetaxel/carboplatin/trastuzumab  . Anxiety   . Hx MRSA infection 03/28/11    Skin  . History of breast cancer DX JUNE 2012 W/ RIGHT BREAST INVASIVE DUCTAL CA  STAGE IIB---  S/P BILATERAL MASTECTOMIES AND RIGHT NODE DISSECTION    ONCOLOGIST-- DR Jana Hakim--  CHEMO ENDED [place] 2012 / RADIATION ENDED MAR 2013--  CURRENT ON TAMOXIFEN AND ZOLADEX  . Family history of anesthesia complication     MOTHER PONV  . Hot flashes due to tamoxifen   . History of idiopathic seizure     DX 2008 --  LAST ONE 2012--  NO ISSUES SINCE AND NO MEDS. -- PT STATES DOCTORS FELT IT WAS STRESS RELATED DUE TO HEALTH ISSUES  . Bipolar affective disorder, mixed   . GERD (gastroesophageal reflux disease)     OCCASIONALLY TAKE ZANTAC  . Left ovarian cyst   . Right lower quadrant abdominal pain   . Frequency of urination   . Urgency of urination   . Strains to urinate   .  Nocturia   . Heart palpitations   . Cancer   . Depression   . Seizures   1. History of seizures. The patient has been thoroughly evaluated both here by Dr. Gaynell Face and at [facility] for this. Among many studies, she had an MRI of the brain on August 28th. This showed an incidental solitary small lesion in the left frontoparietal white matter. It was not felt to be suggestive of multiple sclerosis. A noncontrasted CT in August 2010 was negative. The patient has been off  all seizure medications now for 2 months. There has been no evidence of seizure recurrence. 2. Complex psychology history (please refer to Dr. Iona Coach note in the E-chart from August 29, 2009) with evidence of anxiety disorder, depression and psychosis. The patient is currently on no medications for this and denies any of these symptoms at present. 3. History of migraines. 4. Remote history of multidrug abuse. 5. Status post wisdom teeth extraction.         History of MRSA skin infection   PAST SURGICAL HISTORY: Past Surgical History  Procedure Laterality Date  . Wisdom tooth extraction    . Biopsy breast  10/14/2010    right needle core biopsy  . Portacath placement  11/16/2010    placement of left subclavian port  . I&d extremity  09/26/2011    Procedure: IRRIGATION AND DEBRIDEMENT EXTREMITY;  Surgeon: Tennis Must, MD;  Location: [facility];  Service: Orthopedics;  Laterality: Right;  I&D right hand cat bite wound  . Breast surgery  11/16/2010    bilateral mastectomy+ right axillary node dissection,T1cN1a, Her2+,ERPR+  (right breast invasive ductal carcinoma)  . Transthoracic echocardiogram  05-28-2012    LVF NORMAL/  EF 55-60%  . Cysto with hydrodistension N/A 07/23/2012    Procedure: CYSTOSCOPY/HYDRODISTENSION instillation of marcaine and pyridium;  Surgeon: Reece Packer, MD;  Location: [place];  Service: Urology;  Laterality: N/A;  INSTILLATION      FAMILY HISTORY Family History  Problem Relation Age of Onset  . Cancer Maternal Grandfather   . Mental illness Brother   . Mental illness Son   The patient's mother is living, in her mid 5s, with no history of breast or ovarian cancer. The patient does not know her biological father. The patient has an older brother with bipolar disease, and according to the patient, borderline schizophrenia.   GYNECOLOGIC HISTORY: (Updated January 2015) Menarche at age 29. She is G1, P1 with first pregnancy to term at age 33.  She was not menopausal, and was still having regular periods at the time of her breast cancer diagnosis. She continues on every three-month goserelin.  SOCIAL HISTORY:  (Updated 05/21/2013) She used to work in a [employer] but had to leave that job because of the seizure problem. She's currently unemployed. Her husband , Glendell Docker, works as a Development worker, international aid. Crystal [name] has a son, Liane Comber, who is 53 and recently moved in with his father. [name], according to the patient, has significant nausea problems. The patient attends a local [employer]. Peggyann Juba is pastor.    ADVANCED DIRECTIVES: Not in place  HEALTH MAINTENANCE:  (Updated 05/21/2013) History  Substance Use Topics  . Smoking status: Current Every Day Smoker -- 1.00 packs/day for 20 years    Types: Cigarettes  . Smokeless tobacco: Never Used  . Alcohol Use: Yes     Colonoscopy: Never  PAP: July 2012  Bone density:  Never  Lipid panel: Not on file  Allergies  Allergen Reactions  . Tramadol Other (See Comments)    Seizures  . Phenytoin Nausea And Vomiting  . Thorazine [Chlorpromazine Hcl] Hives  . Ciprofloxacin Nausea And Vomiting  . Vancomycin Rash    Possible rash reported by MD    Current Outpatient Prescriptions  Medication Sig Dispense Refill  . ALPRAZolam (XANAX) 1 MG tablet TAKE ONE TABLET BY MOUTH THREE TIMES DAILY FOR ANXIETY  90 tablet  2  . carvedilol (COREG) 6.25 MG tablet Take 6.25 mg by mouth 2 (two) times daily as needed (rapid heartbeat).      . citalopram (CELEXA) 40 MG tablet Take 1 tablet (40 mg total) by mouth daily.  90 tablet  3  . esomeprazole (NEXIUM) 20 MG capsule Take 20 mg by mouth daily at 12 noon.      Marland Kitchen goserelin (ZOLADEX) 10.8 MG injection Inject 10.8 mg into the skin every 3 (three) months.      Marland Kitchen letrozole (FEMARA) 2.5 MG tablet Take 1 tablet (2.5 mg total) by mouth daily.  90 tablet  4  . topiramate (TOPAMAX) 50 MG tablet Take 2 tablets (100 mg total) by mouth 2 (two) times daily.  120  tablet  12   No current facility-administered medications for this visit.   OBJECTIVE: Young white woman who appears stated age 67 Vitals:   02/26/14 1115  BP: 128/96  Pulse: 91  Temp: 98.6 F (37 C)  Resp: 18  ECOG: 2 Body mass index is 18.65 kg/(m^2). Filed Weights   02/26/14 1115  Weight: 112 lb 1.6 oz (50.848 kg)   January 2015 weight was 146 pounds   Sclerae unicteric, EOMs intact Oropharynx clear, teeth in good repair No cervical or supraclavicular adenopathy Lungs no rales or rhonchi Heart regular rate and rhythm Abd soft, nontender, positive bowel sounds MSK no focal spinal tenderness, no upper extremity lymphedema Neuro: nonfocal, well oriented, appropriate affect Breasts: Status post bilateral mastectomies. There is no evidence of chest wall recurrence. She was worried about a little "spot" in her right axilla. This is a minimal pimple. There is some irritation in both axillae secondary to shaving.  LAB RESULTS: Lab Results  Component Value Date   WBC 6.3 02/26/2014   NEUTROABS 4.7 02/26/2014   HGB 15.6 02/26/2014   HCT 46.4 02/26/2014   MCV 98.4 02/26/2014   PLT 178 02/26/2014      Chemistry      Component Value Date/Time   NA 142 12/03/2013 1337   NA 138 08/22/2012 1700   K 3.5 12/03/2013 1337   K 3.7 08/22/2012 1700   CL 105 10/11/2012 1441   CL 103 08/22/2012 1700   CO2 21* 12/03/2013 1337   CO2 23 08/22/2012 1700   BUN 9.5 12/03/2013 1337   BUN 11 08/22/2012 1700   CREATININE 0.9 12/03/2013 1337   CREATININE 0.96 08/22/2012 1700      Component Value Date/Time   CALCIUM 9.9 12/03/2013 1337   CALCIUM 9.1 08/22/2012 1700   ALKPHOS 95 12/03/2013 1337   ALKPHOS 87 08/22/2012 1700   AST 30 12/03/2013 1337   AST 33 08/22/2012 1700   ALT 22 12/03/2013 1337   ALT 24 08/22/2012 1700   BILITOT 0.45 12/03/2013 1337   BILITOT 0.2* 08/22/2012 1700         STUDIES: No results found.  ASSESSMENT: 38 y.o.  [place] woman, known to be BRCA1 and 2 negative,   (1) status  post bilateral mastectomies May of 2012 for a right-sided T1c N1 (  stage II) grade 3 invasive ductal carcinoma,estrogen and progesterone receptor positive, HER-2 amplified, with an MIB-1 of 85%.   (2) status post carboplatin/ docetaxol/ trastuzumab x6, completed mid December 2012   (3) on trastuzumab every 3 weeks, started August of 2012, interrupted between April and August of 2013, completed 04/27/2012. Most recent echo 08/20/2012  (4) status post radiation therapy, completed 07/12/2011  (5) On tamoxifen starting April 2013, held as of 12/27/2012 with the diagnosis of a left upper extremity DVT.  On therapeutic Coumadin until  May 2015  (6) On goserelin given every 3 months, 1st injection on 10/26/11, most recent dose 04/29/2013.   (7) started on anastrozole November 2014, discontinued January 2015 because of side effects  (8) started letrozole August 2015  Other problems:  (1) Chronic Pain/ fibromyalgia  (2) history of seizure disorder   (3) history of severe migraines   (4) calorie malnutrition, 35 pound weight loss over 9 months    (5) left upper extremity DVT, port associated, documented age 69 12/19/2012; port removed 09/05/2013 and coumadin discontinued at that time   PLAN:  Crystal [name] has continued to lose weight, although had a less steep rate than before. We discussed that at length today. Basically she just does not want to be animals because of the way they are treated. She would be willing to eat eggs for instance if the chickens were really treated properly. I think it would be useful for her to go to the [employer], talked to the people who cell aches there, and actually go visited their forearms and to look at the chickens. If the chickens are running around unhappy then she can get BX from that source and I think that will be an important addition to her diet.  She does not think meeting again with her dietitian would be helpful. She denies purging or body image  problems.  She is willing to continue on the letrozole although it did make her headaches a little bit more intense. We're going to continue that for now and see whether those symptoms improve over the next several months.  She is returning mid-November for an electrocardiogram and we will recheck her weight then. Her next Zoladex will be mid January and we will repeat check her weight at that time. Also I am adding a TSH then.  Crystal [name] has a good understanding of the overall plan. She agrees with it. She knows a goal of treatment in her case  remains cure. She will call with any problems that may develop before her next visit here.3   02/26/2014     Chauncey Cruel, MD

## 2014-02-26 NOTE — Progress Notes (Signed)
Per [facility] the patient advised not eligible for medicaid -because she applied for disability and still waiting on decision with them.

## 2014-02-26 NOTE — Telephone Encounter (Signed)
, °

## 2014-03-12 ENCOUNTER — Other Ambulatory Visit: Payer: Self-pay | Admitting: Diagnostic Neuroimaging

## 2014-03-26 ENCOUNTER — Ambulatory Visit: Payer: Self-pay

## 2014-04-15 ENCOUNTER — Other Ambulatory Visit: Payer: Self-pay | Admitting: Nurse Practitioner

## 2014-04-28 ENCOUNTER — Other Ambulatory Visit: Payer: Self-pay | Admitting: Family Medicine

## 2014-04-29 ENCOUNTER — Telehealth: Payer: Self-pay

## 2014-04-29 NOTE — Telephone Encounter (Signed)
-----   Message from Darreld Mclean, MD sent at 04/28/2014  8:36 PM EST ----- Please give her a call- let her know that I wanted to check on how she is doing and make sure that she is doing ok with her weight (she had been losing too much weight over the last few months). If she has not been weighed recently please come and see me to check in Thanks! Ute

## 2014-04-29 NOTE — Telephone Encounter (Signed)
Patient called back stating she is doing good. Patient stated she has been following up with her oncologist as needed and has had labs done. I informed patient Dr Lorelei Pont would like to see her at some point to check in with her. Patient stated she doesn't know at this time when she would beable to come in. She also stated she didn't feel a need to come in to our office if she was seeing her Oncologist. Patients call back number is [phone]

## 2014-04-29 NOTE — Telephone Encounter (Signed)
Called and left VM to call back.

## 2014-04-29 NOTE — Telephone Encounter (Signed)
FYI

## 2014-05-21 ENCOUNTER — Ambulatory Visit (HOSPITAL_BASED_OUTPATIENT_CLINIC_OR_DEPARTMENT_OTHER): Payer: Self-pay

## 2014-05-21 ENCOUNTER — Other Ambulatory Visit (HOSPITAL_BASED_OUTPATIENT_CLINIC_OR_DEPARTMENT_OTHER): Payer: Self-pay

## 2014-05-21 DIAGNOSIS — I82622 Acute embolism and thrombosis of deep veins of left upper extremity: Secondary | ICD-10-CM

## 2014-05-21 DIAGNOSIS — C50411 Malignant neoplasm of upper-outer quadrant of right female breast: Secondary | ICD-10-CM

## 2014-05-21 DIAGNOSIS — Z5111 Encounter for antineoplastic chemotherapy: Secondary | ICD-10-CM

## 2014-05-21 DIAGNOSIS — C773 Secondary and unspecified malignant neoplasm of axilla and upper limb lymph nodes: Secondary | ICD-10-CM

## 2014-05-21 DIAGNOSIS — I82629 Acute embolism and thrombosis of deep veins of unspecified upper extremity: Secondary | ICD-10-CM

## 2014-05-21 LAB — CBC WITH DIFFERENTIAL/PLATELET
BASO%: 0.6 % (ref 0.0–2.0)
BASOS ABS: 0 10*3/uL (ref 0.0–0.1)
EOS ABS: 0.1 10*3/uL (ref 0.0–0.5)
EOS%: 2.4 % (ref 0.0–7.0)
HCT: 42 % (ref 34.8–46.6)
HGB: 15 g/dL (ref 11.6–15.9)
LYMPH#: 1.4 10*3/uL (ref 0.9–3.3)
LYMPH%: 28.4 % (ref 14.0–49.7)
MCH: 32.8 pg (ref 25.1–34.0)
MCHC: 35.7 g/dL (ref 31.5–36.0)
MCV: 91.7 fL (ref 79.5–101.0)
MONO#: 0.2 10*3/uL (ref 0.1–0.9)
MONO%: 3.6 % (ref 0.0–14.0)
NEUT#: 3.3 10*3/uL (ref 1.5–6.5)
NEUT%: 65 % (ref 38.4–76.8)
PLATELETS: 145 10*3/uL (ref 145–400)
RBC: 4.58 10*6/uL (ref 3.70–5.45)
RDW: 12.1 % (ref 11.2–14.5)
WBC: 5 10*3/uL (ref 3.9–10.3)
nRBC: 0 % (ref 0–0)

## 2014-05-21 LAB — COMPREHENSIVE METABOLIC PANEL (CC13)
ALBUMIN: 4.5 g/dL (ref 3.5–5.0)
ALT: 20 U/L (ref 0–55)
AST: 19 U/L (ref 5–34)
Alkaline Phosphatase: 105 U/L (ref 40–150)
Anion Gap: 10 mEq/L (ref 3–11)
BUN: 9.3 mg/dL (ref 7.0–26.0)
CO2: 22 mEq/L (ref 22–29)
Calcium: 9.4 mg/dL (ref 8.4–10.4)
Chloride: 108 mEq/L (ref 98–109)
Creatinine: 0.9 mg/dL (ref 0.6–1.1)
EGFR: 82 mL/min/{1.73_m2} — ABNORMAL LOW (ref >90)
Glucose: 82 mg/dl (ref 70–140)
POTASSIUM: 3.4 meq/L — AB (ref 3.5–5.1)
SODIUM: 140 meq/L (ref 136–145)
Total Bilirubin: 0.36 mg/dL (ref 0.20–1.20)
Total Protein: 7 g/dL (ref 6.4–8.3)

## 2014-05-21 LAB — TSH CHCC: TSH: 1.114 m(IU)/L (ref 0.308–3.960)

## 2014-05-21 MED ORDER — GOSERELIN ACETATE 10.8 MG ~~LOC~~ IMPL
10.8000 mg | DRUG_IMPLANT | SUBCUTANEOUS | Status: DC
  Administered 2014-05-21: 10.8 mg via SUBCUTANEOUS
  Filled 2014-05-21: qty 10.8

## 2014-06-03 ENCOUNTER — Other Ambulatory Visit: Payer: Self-pay | Admitting: Diagnostic Neuroimaging

## 2014-06-03 NOTE — Telephone Encounter (Signed)
Per notes from April

## 2014-06-05 ENCOUNTER — Other Ambulatory Visit: Payer: Self-pay | Admitting: Diagnostic Neuroimaging

## 2014-06-09 ENCOUNTER — Ambulatory Visit (INDEPENDENT_AMBULATORY_CARE_PROVIDER_SITE_OTHER): Payer: Medicare Other | Admitting: Emergency Medicine

## 2014-06-09 VITALS — BP 102/68 | HR 108 | Temp 98.4°F | Resp 16 | Ht 66.0 in | Wt 106.0 lb

## 2014-06-09 DIAGNOSIS — J029 Acute pharyngitis, unspecified: Secondary | ICD-10-CM

## 2014-06-09 DIAGNOSIS — R0981 Nasal congestion: Secondary | ICD-10-CM | POA: Diagnosis not present

## 2014-06-09 LAB — POCT INFLUENZA A/B
INFLUENZA A, POC: NEGATIVE
Influenza B, POC: NEGATIVE

## 2014-06-09 MED ORDER — AMOXICILLIN 875 MG PO TABS: 875.0000 mg | ORAL_TABLET | Freq: Two times a day (BID) | ORAL | Status: DC

## 2014-06-09 MED ORDER — FLUTICASONE PROPIONATE 50 MCG/ACT NA SUSP: 2.0000 | Freq: Every day | NASAL | Status: DC

## 2014-06-09 NOTE — Progress Notes (Addendum)
Subjective:  This chart was scribed for Crystal Russian, MD by Ladene Artist, ED Scribe. The patient was seen in room 5. Patient's care was started at 10:40 AM.   Patient ID: Crystal [name], female    DOB: 03-03-1976, 39 y.o.   MRN: [id number]  Chief Complaint  Patient presents with  . Fatigue    x2-3 days  . Chills  . Fever  . Generalized Body Aches  . Cough    dark green sputum   HPI HPI Comments: Crystal [name] is a 39 y.o. female, with a h/o breast CA, seizures, depression, who presents to the [facility] complaining of persistent, productive cough with green sputum over the past few days. She reports associated postnasal drip, nausea, generalized body aches, fatigue, chills. Pt denies vomiting and fever. Pt's last CXR is unknown. She did not have a flu vaccine this year. Pt is a smoker.   Weight Change Pt reports recent unexpected 60 lb weight loss. She states that she switched to vegetarian which she suspects may have attributed to weight loss.   PCP: Lamar Blinks, MD Oncologist: Lurline Del, MD  Past Medical History  Diagnosis Date  . Status post chemotherapy     docetaxel/carboplatin/trastuzumab  . Anxiety   . Hx MRSA infection 03/28/11    Skin  . History of breast cancer DX JUNE 2012 W/ RIGHT BREAST INVASIVE DUCTAL CA  STAGE IIB---  S/P BILATERAL MASTECTOMIES AND RIGHT NODE DISSECTION    ONCOLOGIST-- DR Jana Hakim--  CHEMO ENDED [place] 2012 / RADIATION ENDED MAR 2013--  CURRENT ON TAMOXIFEN AND ZOLADEX  . Family history of anesthesia complication     MOTHER PONV  . Hot flashes due to tamoxifen   . History of idiopathic seizure     DX 2008 --  LAST ONE 2012--  NO ISSUES SINCE AND NO MEDS. -- PT STATES DOCTORS FELT IT WAS STRESS RELATED DUE TO HEALTH ISSUES  . Bipolar affective disorder, mixed   . GERD (gastroesophageal reflux disease)     OCCASIONALLY TAKE ZANTAC  . Left ovarian cyst   . Right lower quadrant abdominal pain   . Frequency of  urination   . Urgency of urination   . Strains to urinate   . Nocturia   . Heart palpitations   . Cancer   . Depression   . Seizures    Current Outpatient Prescriptions on File Prior to Visit  Medication Sig Dispense Refill  . ALPRAZolam (XANAX) 1 MG tablet TAKE ONE TABLET BY MOUTH THREE TIMES DAILY FOR ANXIETY 90 tablet 1  . citalopram (CELEXA) 40 MG tablet Take 1 tablet (40 mg total) by mouth daily. 90 tablet 3  . esomeprazole (NEXIUM) 20 MG capsule Take 20 mg by mouth daily at 12 noon.    Marland Kitchen goserelin (ZOLADEX) 10.8 MG injection Inject 10.8 mg into the skin every 3 (three) months.    Marland Kitchen letrozole (FEMARA) 2.5 MG tablet Take 1 tablet (2.5 mg total) by mouth daily. 90 tablet 4  . topiramate (TOPAMAX) 50 MG tablet TAKE TWO TABLETS BY MOUTH TWICE DAILY  120 tablet 0  . carvedilol (COREG) 6.25 MG tablet Take 6.25 mg by mouth 2 (two) times daily as needed (rapid heartbeat).    . Cyanocobalamin (VITAMIN B 12 PO) Take 1 tablet by mouth 2 (two) times daily.     No current facility-administered medications on file prior to visit.   Allergies  Allergen Reactions  . Tramadol Other (See Comments)  Seizures  . Phenytoin Nausea And Vomiting  . Thorazine [Chlorpromazine Hcl] Hives  . Ciprofloxacin Nausea And Vomiting  . Vancomycin Rash    Possible rash reported by MD   Review of Systems  Constitutional: Positive for chills, fatigue and unexpected weight change. Negative for fever.  HENT: Positive for postnasal drip.   Respiratory: Positive for cough.   Gastrointestinal: Positive for nausea. Negative for vomiting.  Musculoskeletal: Positive for myalgias.      Objective:   Physical Exam CONSTITUTIONAL: Well developed/well nourished, thin HEAD: Normocephalic/atraumatic EYES: EOMI/PERRL ENMT: Mucous membranes moist, mild redness at the nares bilaterally NECK: supple no meningeal signs SPINE/BACK:entire spine nontender CV: S1/S2 noted, no murmurs/rubs/gallops noted LUNGS: Lungs are  clear to auscultation bilaterally, no apparent distress ABDOMEN: soft, nontender, no rebound or guarding, bowel sounds noted throughout abdomen GU:no cva tenderness NEURO: Pt is awake/alert/appropriate, moves all extremitiesx4.  No facial droop.   EXTREMITIES: pulses normal/equal, full ROM SKIN: warm, color normal PSYCH: no abnormalities of mood noted, alert and oriented to situation    Results for orders placed or performed in visit on 06/09/14  POCT Influenza A/B  Result Value Ref Range   Influenza A, POC Negative    Influenza B, POC Negative    Assessment & Plan:  Patient presents with upper respiratory infection and sinusitis. Strep test is negative. Will treat with amoxicillin and Flonase. Patient has not been eating well she has been losing weight I advised her to make an appointment with [facility] for evaluation of this. She has had very poor by mouth intake being primarily vegetarian.

## 2014-06-09 NOTE — Patient Instructions (Signed)

## 2014-06-17 ENCOUNTER — Ambulatory Visit: Payer: Self-pay | Admitting: Diagnostic Neuroimaging

## 2014-06-18 ENCOUNTER — Ambulatory Visit (HOSPITAL_BASED_OUTPATIENT_CLINIC_OR_DEPARTMENT_OTHER): Payer: Self-pay | Admitting: Oncology

## 2014-06-18 ENCOUNTER — Telehealth: Payer: Self-pay | Admitting: Oncology

## 2014-06-18 VITALS — BP 130/86 | HR 79 | Temp 98.0°F | Resp 18 | Ht 66.0 in | Wt 104.0 lb

## 2014-06-18 DIAGNOSIS — E46 Unspecified protein-calorie malnutrition: Secondary | ICD-10-CM

## 2014-06-18 DIAGNOSIS — Z170 Estrogen receptor positive status [ER+]: Secondary | ICD-10-CM

## 2014-06-18 DIAGNOSIS — C50411 Malignant neoplasm of upper-outer quadrant of right female breast: Secondary | ICD-10-CM

## 2014-06-18 DIAGNOSIS — I82629 Acute embolism and thrombosis of deep veins of unspecified upper extremity: Secondary | ICD-10-CM

## 2014-06-18 DIAGNOSIS — E43 Unspecified severe protein-calorie malnutrition: Secondary | ICD-10-CM

## 2014-06-18 DIAGNOSIS — G43109 Migraine with aura, not intractable, without status migrainosus: Secondary | ICD-10-CM

## 2014-06-18 DIAGNOSIS — G40909 Epilepsy, unspecified, not intractable, without status epilepticus: Secondary | ICD-10-CM

## 2014-06-18 DIAGNOSIS — C773 Secondary and unspecified malignant neoplasm of axilla and upper limb lymph nodes: Secondary | ICD-10-CM

## 2014-06-18 MED ORDER — PREDNISONE 10 MG PO TABS: 10.0000 mg | ORAL_TABLET | Freq: Every day | ORAL | Status: DC

## 2014-06-18 MED ORDER — LETROZOLE 2.5 MG PO TABS: 2.5000 mg | ORAL_TABLET | Freq: Every day | ORAL | Status: DC

## 2014-06-18 MED ORDER — TOPIRAMATE 50 MG PO TABS: 100.0000 mg | ORAL_TABLET | Freq: Two times a day (BID) | ORAL | Status: DC

## 2014-06-18 NOTE — Progress Notes (Signed)
ID: Crystal [name]   DOB: [birth year]  MR#: [id number]  JTT#:[id number]  ESP:[id number], MD GYN: SULorine Bears, MD OTHER:  Elmarie Mainland   CHIEF COMPLAINT:  Hx of Right Breast Cancer CURRENT THERAPY: Goserelin,  letrozole   BREAST CANCER HISTORY: From the initial intake note:  "The patient is a 39 year old [name] who noted a mass in her right breast and brought it to her primary care physician's attention June of 2012. She was set up for mammography at [facility], where an area of calcifications highly suspicious for carcinoma was biopsied. This was in the upper outer quadrant and it measured 1.7 cm mammographically and 1.8 cm by ultrasonography. The pathology report ([id number]) showed a high-grade invasive ductal carcinoma estrogen receptor positive at 64% progesterone receptor positive at 18%, and HER-2 amplified with a ratio by CISH of 5.24. MIB-1-1 was 65%. A second mass biopsied at the same time was also triple positive.  Given her multiple masses in the right breast, mastectomy was recommended. The patient actually opted for bilateral mastectomies and this was performed together with a right axillary lymph node dissection under Dr. Star Age on 11/16/2010. The final pathology [id number]) showed, on the left, no evidence of cancer. On the right the patient had a high-grade invasive ductal carcinoma measuring 1.9 cm, with negative margins, grade 3, involving 2 of 16 lymph nodes (stage IIB)."  Her subsequent history is as detailed below.  INTERVAL HISTORY:  Crystal [name] returns today for followup of her breast cancer. She continues to lose weight although she tells me she is trying. She says Glendell Docker has gotten her some boost recently. She says she is eating fish and eating a lot of vegetables. At the same time she tells me she has no appetite. She denies nausea or vomiting, denies purging. She has had no problems with diarrhea or constipation. She is tolerating the  letrozole with no side effects that she is aware of other than hot flashes and  "absolutely no libido".  She had a  Sinus infection and is completing a course of antibiotics. She has mild joint pain here and there. It is not more persistent or intense than prior.   REVIEW OF SYSTEMS: A detailed review of systems today was otherwise noncontributory  PAST MEDICAL HISTORY: Past Medical History  Diagnosis Date  . Status post chemotherapy     docetaxel/carboplatin/trastuzumab  . Anxiety   . Hx MRSA infection 03/28/11    Skin  . History of breast cancer DX JUNE 2012 W/ RIGHT BREAST INVASIVE DUCTAL CA  STAGE IIB---  S/P BILATERAL MASTECTOMIES AND RIGHT NODE DISSECTION    ONCOLOGIST-- DR Jana Hakim--  CHEMO ENDED [place] 2012 / RADIATION ENDED MAR 2013--  CURRENT ON TAMOXIFEN AND ZOLADEX  . Family history of anesthesia complication     MOTHER PONV  . Hot flashes due to tamoxifen   . History of idiopathic seizure     DX 2008 --  LAST ONE 2012--  NO ISSUES SINCE AND NO MEDS. -- PT STATES DOCTORS FELT IT WAS STRESS RELATED DUE TO HEALTH ISSUES  . Bipolar affective disorder, mixed   . GERD (gastroesophageal reflux disease)     OCCASIONALLY TAKE ZANTAC  . Left ovarian cyst   . Right lower quadrant abdominal pain   . Frequency of urination   . Urgency of urination   . Strains to urinate   . Nocturia   . Heart palpitations   . Cancer   . Depression   .  Seizures   1. History of seizures. The patient has been thoroughly evaluated both here by Dr. Gaynell Face and at [facility] for this. Among many studies, she had an MRI of the brain on August 28th. This showed an incidental solitary small lesion in the left frontoparietal white matter. It was not felt to be suggestive of multiple sclerosis. A noncontrasted CT in August 2010 was negative. The patient has been off all seizure medications now for 2 months. There has been no evidence of seizure recurrence. 2. Complex psychology history (please refer to Dr.  Iona Coach note in the E-chart from August 29, 2009) with evidence of anxiety disorder, depression and psychosis. The patient is currently on no medications for this and denies any of these symptoms at present. 3. History of migraines. 4. Remote history of multidrug abuse. 5. Status post wisdom teeth extraction.         History of MRSA skin infection   PAST SURGICAL HISTORY: Past Surgical History  Procedure Laterality Date  . Wisdom tooth extraction    . Biopsy breast  10/14/2010    right needle core biopsy  . Portacath placement  11/16/2010    placement of left subclavian port  . I&d extremity  09/26/2011    Procedure: IRRIGATION AND DEBRIDEMENT EXTREMITY;  Surgeon: Tennis Must, MD;  Location: [facility];  Service: Orthopedics;  Laterality: Right;  I&D right hand cat bite wound  . Breast surgery  11/16/2010    bilateral mastectomy+ right axillary node dissection,T1cN1a, Her2+,ERPR+  (right breast invasive ductal carcinoma)  . Transthoracic echocardiogram  05-28-2012    LVF NORMAL/  EF 55-60%  . Cysto with hydrodistension N/A 07/23/2012    Procedure: CYSTOSCOPY/HYDRODISTENSION instillation of marcaine and pyridium;  Surgeon: Reece Packer, MD;  Location: [place];  Service: Urology;  Laterality: N/A;  INSTILLATION      FAMILY HISTORY Family History  Problem Relation Age of Onset  . Cancer Maternal Grandfather   . Mental illness Brother   . Mental illness Son   The patient's mother is living, in her mid 82s, with no history of breast or ovarian cancer. The patient does not know her biological father. The patient has an older brother with bipolar disease, and according to the patient, borderline schizophrenia.   GYNECOLOGIC HISTORY: (Updated January 2015) Menarche at age 67. She is G1, P1 with first pregnancy to term at age 78. She was not menopausal, and was still having regular periods at the time of her breast cancer diagnosis. She continues on every three-month  goserelin.  SOCIAL HISTORY:  (Updated 05/21/2013) She used to work in a [employer] but had to leave that job because of the seizure problem. She's currently unemployed. Her husband , Glendell Docker, works as a Development worker, international aid. Crystal [name] has a son, Crystal [name], who is 54 and recently moved in with his father. Crystal [name], according to the patient, has significant nausea problems. The patient attends a local [employer]. Peggyann Juba is pastor.    ADVANCED DIRECTIVES: Not in place  HEALTH MAINTENANCE:  (Updated 05/21/2013) History  Substance Use Topics  . Smoking status: Current Every Day Smoker -- 1.00 packs/day for 20 years    Types: Cigarettes  . Smokeless tobacco: Never Used  . Alcohol Use: Yes     Colonoscopy: Never  PAP: July 2012  Bone density:  Never  Lipid panel: Not on file  Allergies  Allergen Reactions  . Tramadol Other (See Comments)    Seizures  . Phenytoin Nausea And  Vomiting  . Thorazine [Chlorpromazine Hcl] Hives  . Ciprofloxacin Nausea And Vomiting  . Vancomycin Rash    Possible rash reported by MD    Current Outpatient Prescriptions  Medication Sig Dispense Refill  . ALPRAZolam (XANAX) 1 MG tablet TAKE ONE TABLET BY MOUTH THREE TIMES DAILY FOR ANXIETY 90 tablet 1  . amoxicillin (AMOXIL) 875 MG tablet Take 1 tablet (875 mg total) by mouth 2 (two) times daily. 20 tablet 0  . carvedilol (COREG) 6.25 MG tablet Take 6.25 mg by mouth 2 (two) times daily as needed (rapid heartbeat).    . citalopram (CELEXA) 40 MG tablet Take 1 tablet (40 mg total) by mouth daily. 90 tablet 3  . Cyanocobalamin (VITAMIN B 12 PO) Take 1 tablet by mouth 2 (two) times daily.    Marland Kitchen esomeprazole (NEXIUM) 20 MG capsule Take 20 mg by mouth daily at 12 noon.    . fluticasone (FLONASE) 50 MCG/ACT nasal spray Place 2 sprays into both nostrils daily. 16 g 6  . goserelin (ZOLADEX) 10.8 MG injection Inject 10.8 mg into the skin every 3 (three) months.    Marland Kitchen letrozole (FEMARA) 2.5 MG tablet Take 1 tablet (2.5 mg  total) by mouth daily. 90 tablet 4  . topiramate (TOPAMAX) 50 MG tablet TAKE TWO TABLETS BY MOUTH TWICE DAILY  120 tablet 0   No current facility-administered medications for this visit.   OBJECTIVE: Young white woman who appears  malnourished  Filed Vitals:   06/18/14 1105  BP: 130/86  Pulse: 79  Temp: 98 F (36.7 C)  Resp: 18  ECOG: 2 Body mass index is 16.79 kg/(m^2). Filed Weights   06/18/14 1105  Weight: 104 lb (47.174 kg)   January 2015 weight was 146 pounds,  May 2015 weight was 136 pounds, August 2015 weight was 116 pounds.  Sclerae unicteric,  Pupils round and equal Oropharynx clear and moist No cervical or supraclavicular adenopathy Lungs no rales or rhonchi Heart regular rate and rhythm Abd soft, nontender, positive bowel sounds , no masses palpated MSK no focal spinal tenderness, no upper extremity lymphedema Neuro: nonfocal, well oriented,  Pleasant but passive affect Breasts:  Deferred  LAB RESULTS: Lab Results  Component Value Date   WBC 5.0 05/21/2014   NEUTROABS 3.3 05/21/2014   HGB 15.0 05/21/2014   HCT 42.0 05/21/2014   MCV 91.7 05/21/2014   PLT 145 05/21/2014      Chemistry      Component Value Date/Time   NA 140 05/21/2014 1124   NA 138 08/22/2012 1700   K 3.4* 05/21/2014 1124   K 3.7 08/22/2012 1700   CL 105 10/11/2012 1441   CL 103 08/22/2012 1700   CO2 22 05/21/2014 1124   CO2 23 08/22/2012 1700   BUN 9.3 05/21/2014 1124   BUN 11 08/22/2012 1700   CREATININE 0.9 05/21/2014 1124   CREATININE 0.96 08/22/2012 1700      Component Value Date/Time   CALCIUM 9.4 05/21/2014 1124   CALCIUM 9.1 08/22/2012 1700   ALKPHOS 105 05/21/2014 1124   ALKPHOS 87 08/22/2012 1700   AST 19 05/21/2014 1124   AST 33 08/22/2012 1700   ALT 20 05/21/2014 1124   ALT 24 08/22/2012 1700   BILITOT 0.36 05/21/2014 1124   BILITOT 0.2* 08/22/2012 1700         STUDIES: No results found.   ASSESSMENT: 39 y.o.  [place] woman, known to be BRCA1 and 2  negative,   (1) status post bilateral mastectomies May  of 2012 for a right-sided T1c N1 (stage II) grade 3 invasive ductal carcinoma,estrogen and progesterone receptor positive, HER-2 amplified, with an MIB-1 of 85%.   (2) status post carboplatin/ docetaxol/ trastuzumab x6, completed mid December 2012   (3) on trastuzumab every 3 weeks, started August of 2012, interrupted between April and August of 2013, completed 04/27/2012. Most recent echo 08/20/2012  (4) status post radiation therapy, completed 07/12/2011  (5) On tamoxifen starting April 2013, held as of 12/27/2012 with the diagnosis of a left upper extremity DVT.  On therapeutic Coumadin until  May 2015  (6) On goserelin given every 3 months, 1st injection on 10/26/11, most recent dose 04/29/2013.   (7) started on anastrozole November 2014, discontinued January 2015 because of side effects  (8) started letrozole August 2015  Other problems:  (1) Chronic Pain/ fibromyalgia  (2) history of seizure disorder   (3) history of severe migraines   (4)  Severe proteincalorie malnutrition   (5) left upper extremity DVT, port associated, documented age 70 12/19/2012; port removed 09/05/2013 and coumadin discontinued at that time   PLAN:  Felisa continues to lose weight.  She refuses to meet with a nutritionist. She has no insurance and no Medicaid and she tells me her application for disability is not due for review until mid March.    I think if she continues to lose weight at this rate she is going to get into an irreversible decline.  I offered to admit her today so she can get the help she needs, but she wants to just "it a little better and have some boost period". She agreed to trying prednisone 10 mg in the morning, as an appetite stimulant.   I'm going to see her again March 24. If she has gained no weight by that time I will plan on admission.    06/18/2014     Chauncey Cruel, MD

## 2014-06-18 NOTE — Telephone Encounter (Signed)
per pof to sch pt appt-gave pt copy of sch °

## 2014-06-19 ENCOUNTER — Encounter: Payer: Self-pay | Admitting: Diagnostic Neuroimaging

## 2014-06-19 ENCOUNTER — Ambulatory Visit (INDEPENDENT_AMBULATORY_CARE_PROVIDER_SITE_OTHER): Payer: Self-pay | Admitting: Diagnostic Neuroimaging

## 2014-06-19 VITALS — BP 137/94 | HR 119 | Ht 67.0 in | Wt 104.4 lb

## 2014-06-19 DIAGNOSIS — G43009 Migraine without aura, not intractable, without status migrainosus: Secondary | ICD-10-CM

## 2014-06-19 NOTE — Progress Notes (Signed)
[facility]  PATIENT: Crystal [name] DOB: [birth year]  REFERRING CLINICIAN:  HISTORY FROM: patient REASON FOR VISIT: follow up   HISTORICAL  CHIEF COMPLAINT:  Chief Complaint  Patient presents with  . Follow-up    migraines    HISTORY OF PRESENT ILLNESS:   UPDATE 06/19/14: Since last visit, HA are much improved. Sleep, mood and hot flashes improved. Patient attributes this to topiramate. Now having 1 day dull HA every 3 weeks. Also patient's significant change her diet over the past year. Now she is eating mainly vegetable-based diet, occasionally fish. She has had unintended 45 pound weight loss as a result of calorie restriction. Also off coumadin now. Also had her port removed.   UPDATE 06/07/13: Since last visit, tried gabapentin, but HA have worsened. Over last few months, daily HA, with photophobia. Using tylenol 2 tabs TID, on daily basis. Has stopped soda, but drinks 1 pot coffee between 8-noon. Poor sleep, more anxiety, more mood disturbances. Does not want to go back to psychiatry. Also with hot flashes, that interrupt sleep. No seizures or non-epileptic spells. Yesterday had morelethargy and confusion, but feels better today. Also, has started coumadin (Oct 2014) for LUE DVT.  PRIOR HPI (12/22/11): 39 year old right-handed female, with history of high-grade invasive ductal carcinoma estrogen receptor positive breast cancer, status post bilateral mastectomy, right axillary lymph node dissection, status post chemotherapy (docetaxel, carboplatin, trastuzumab), here for evaluation of headaches and seizure disorder. Patient previously seen by Dr. Estella Husk, then Dr. Gaynell Face, for a number of years for possible seizure disorder. First seizure occurred in 2008. Apparently she had vomiting, loss of consciousness, convulsions and then confusion. She's tried on a variety of anticonvulsants but her seizures seem to get worse over time. Ultimately she was referred to [facility] video EEG monitoring, but unclear if any spells were captured. She was taken off of all of her anticonvulsant medications and she had no further events. Therefore conclusion was that these may have been nonepileptic spells. She's had no further events until the past few months which had increasing similar spells of confusion, bilateral leg weakness, falling down, confusion. One episode involved bilateral leg convulsions without loss of consciousness. Over the past one year she's had increasing headaches, bandlike sensation across her head sometimes over her forehead, with pounding sensation, photophobia, phonophobia nausea. She's tried ibuprofen, Tylenol, Aleve without significant relief. She tried taking her relatives gabapentin 600 mg tablet x1, which significantly helped her headaches. Aggravating factors include drinking 3-4 beers every night, drinking up to 1 L of caffeinated soda per day, and increasing stress.  REVIEW OF SYSTEMS: Full 14 system review of systems performed and notable only for weight loss headache.    ALLERGIES: Allergies  Allergen Reactions  . Tramadol Other (See Comments)    Seizures  . Phenytoin Nausea And Vomiting  . Thorazine [Chlorpromazine Hcl] Hives  . Ciprofloxacin Nausea And Vomiting  . Vancomycin Rash    Possible rash reported by MD    HOME MEDICATIONS: Outpatient Prescriptions Prior to Visit  Medication Sig Dispense Refill  . ALPRAZolam (XANAX) 1 MG tablet TAKE ONE TABLET BY MOUTH THREE TIMES DAILY FOR ANXIETY 90 tablet 1  . amoxicillin (AMOXIL) 875 MG tablet Take 1 tablet (875 mg total) by mouth 2 (two) times daily. 20 tablet 0  . citalopram (CELEXA) 40 MG tablet Take 1 tablet (40 mg total) by mouth daily. 90 tablet 3  . esomeprazole (NEXIUM) 20 MG capsule Take 20 mg by mouth daily at  12 noon.    . fluticasone (FLONASE) 50 MCG/ACT nasal spray Place 2 sprays into both nostrils daily. 16 g 6  . goserelin (ZOLADEX) 10.8 MG injection Inject 10.8 mg  into the skin every 3 (three) months.    Marland Kitchen letrozole (FEMARA) 2.5 MG tablet Take 1 tablet (2.5 mg total) by mouth daily. 90 tablet 4  . predniSONE (DELTASONE) 10 MG tablet Take 1 tablet (10 mg total) by mouth daily with breakfast. 90 tablet 3  . topiramate (TOPAMAX) 50 MG tablet Take 2 tablets (100 mg total) by mouth 2 (two) times daily. 120 tablet 0  . Cyanocobalamin (VITAMIN B 12 PO) Take 1 tablet by mouth 2 (two) times daily.     No facility-administered medications prior to visit.    PAST MEDICAL HISTORY: Past Medical History  Diagnosis Date  . Status post chemotherapy     docetaxel/carboplatin/trastuzumab  . Anxiety   . Hx MRSA infection 03/28/11    Skin  . History of breast cancer DX JUNE 2012 W/ RIGHT BREAST INVASIVE DUCTAL CA  STAGE IIB---  S/P BILATERAL MASTECTOMIES AND RIGHT NODE DISSECTION    ONCOLOGIST-- DR Jana Hakim--  CHEMO ENDED [place] 2012 / RADIATION ENDED MAR 2013--  CURRENT ON TAMOXIFEN AND ZOLADEX  . Family history of anesthesia complication     MOTHER PONV  . Hot flashes due to tamoxifen   . History of idiopathic seizure     DX 2008 --  LAST ONE 2012--  NO ISSUES SINCE AND NO MEDS. -- PT STATES DOCTORS FELT IT WAS STRESS RELATED DUE TO HEALTH ISSUES  . Bipolar affective disorder, mixed   . GERD (gastroesophageal reflux disease)     OCCASIONALLY TAKE ZANTAC  . Left ovarian cyst   . Right lower quadrant abdominal pain   . Frequency of urination   . Urgency of urination   . Strains to urinate   . Nocturia   . Heart palpitations   . Cancer   . Depression   . Seizures     PAST SURGICAL HISTORY: Past Surgical History  Procedure Laterality Date  . Wisdom tooth extraction    . Biopsy breast  10/14/2010    right needle core biopsy  . Portacath placement  11/16/2010    placement of left subclavian port  . I&d extremity  09/26/2011    Procedure: IRRIGATION AND DEBRIDEMENT EXTREMITY;  Surgeon: Tennis Must, MD;  Location: [facility];  Service: Orthopedics;  Laterality:  Right;  I&D right hand cat bite wound  . Breast surgery  11/16/2010    bilateral mastectomy+ right axillary node dissection,T1cN1a, Her2+,ERPR+  (right breast invasive ductal carcinoma)  . Transthoracic echocardiogram  05-28-2012    LVF NORMAL/  EF 55-60%  . Cysto with hydrodistension N/A 07/23/2012    Procedure: CYSTOSCOPY/HYDRODISTENSION instillation of marcaine and pyridium;  Surgeon: Reece Packer, MD;  Location: [place];  Service: Urology;  Laterality: N/A;  INSTILLATION      FAMILY HISTORY: Family History  Problem Relation Age of Onset  . Cancer Maternal Grandfather   . Mental illness Brother   . Mental illness Son     SOCIAL HISTORY:  History   Social History  . Marital Status: Married    Spouse Name: Glendell Docker  . Number of Children: 1  . Years of Education: HS   Occupational History  .     Social History Main Topics  . Smoking status: Current Every Day Smoker -- 1.00 packs/day for 20 years  Types: Cigarettes  . Smokeless tobacco: Never Used  . Alcohol Use: Yes  . Drug Use: No     Comment: LAST USED COCAINE AND [place] 2011  . Sexual Activity:    Partners: Male    Birth Control/ Protection: Injection     Comment: "zylodex" to "shut down ovaries"   Other Topics Concern  . Not on file   Social History Narrative   Married 4 years   1 son age 61   Unemployed      Grandfather died unknown malignacy   No family h/o breast, ovarian or colon ca      Menarche age 67      Patient lives at home with spouse.   Caffeine Use: Up to 1 pot of coffee daily                    PHYSICAL EXAM  Filed Vitals:   06/19/14 1405  BP: 137/94  Pulse: 119  Height: _0  (1.702 m)  Weight: 104 lb 6.4 oz (47.356 kg)    Not recorded      Body mass index is 16.35 kg/(m^2).  GENERAL EXAM: Patient is in no distress; well developed, nourished and groomed; neck is supple  CARDIOVASCULAR: Regular rate and rhythm, no murmurs, no carotid  bruits  NEUROLOGIC: MENTAL STATUS: awake, alert, oriented to person, place and time, recent and remote memory intact, normal attention and concentration, language fluent, comprehension intact, naming intact, fund of knowledge appropriate CRANIAL NERVE: no papilledema on fundoscopic exam, pupils equal and reactive to light, PHOTOPHOBIA, visual fields full to confrontation, extraocular muscles intact, no nystagmus, facial sensation and strength symmetric, hearing intact, palate elevates symmetrically, uvula midline, shoulder shrug symmetric, tongue midline. MOTOR: POSTURAL AND ACTION TREMOR OF BUE. Normal bulk and tone, full strength in the BUE, BLE SENSORY: normal and symmetric to light touch COORDINATION: finger-nose-finger, fine finger movements, normal REFLEXES: deep tendon reflexes present and symmetric GAIT/STATION: narrow based gait; romberg is negative    DIAGNOSTIC DATA (LABS, IMAGING, TESTING) - I reviewed patient records, labs, notes, testing and imaging myself where available.  Lab Results  Component Value Date   WBC 5.0 05/21/2014   HGB 15.0 05/21/2014   HCT 42.0 05/21/2014   MCV 91.7 05/21/2014   PLT 145 05/21/2014      Component Value Date/Time   NA 140 05/21/2014 1124   NA 138 08/22/2012 1700   K 3.4* 05/21/2014 1124   K 3.7 08/22/2012 1700   CL 105 10/11/2012 1441   CL 103 08/22/2012 1700   CO2 22 05/21/2014 1124   CO2 23 08/22/2012 1700   GLUCOSE 82 05/21/2014 1124   GLUCOSE 97 10/11/2012 1441   GLUCOSE 89 08/22/2012 1700   BUN 9.3 05/21/2014 1124   BUN 11 08/22/2012 1700   CREATININE 0.9 05/21/2014 1124   CREATININE 0.96 08/22/2012 1700   CALCIUM 9.4 05/21/2014 1124   CALCIUM 9.1 08/22/2012 1700   PROT 7.0 05/21/2014 1124   PROT 6.3 08/22/2012 1700   ALBUMIN 4.5 05/21/2014 1124   ALBUMIN 3.6 08/22/2012 1700   AST 19 05/21/2014 1124   AST 33 08/22/2012 1700   ALT 20 05/21/2014 1124   ALT 24 08/22/2012 1700   ALKPHOS 105 05/21/2014 1124   ALKPHOS 87  08/22/2012 1700   BILITOT 0.36 05/21/2014 1124   BILITOT 0.2* 08/22/2012 1700   GFRNONAA 76* 08/22/2012 1700   GFRAA 88* 08/22/2012 1700   No results found for: CHOL No results found for:  HGBA1C No results found for: VITAMINB12 Lab Results  Component Value Date   TSH 1.114 05/21/2014    I reviewed images myself and agree with interpretation. -VRP  01/16/13 MRI BRAIN - No evidence of metastatic disease. Chronic abnormal T2 signal within the pons, probably related to previous treatment.  01/16/13 MRI CERVICAL - No evidence of metastatic disease. Mild degenerative disease at C5-6 and C6-7 without gross neural compression.   ASSESSMENT AND PLAN  39 y.o. year old female here with migraine and tension headaches, with good response to topiramate. Excessive weight loss was due to significant change in diet / choices (now pescatarian). She will monitor and try to increase calorie intake.    PLAN: - continue topiramate for migraine prevention    Return in about 6 months (around 12/18/2014).    Penni Bombard, MD 6/60/6301, 6:01 PM Certified in Neurology, Neurophysiology and Neuroimaging  [facility] [address] [place], [place] [address] [phone] Demara Lover.

## 2014-06-19 NOTE — Patient Instructions (Signed)
Continue current medications.  Monitor calorie, weight, physical activity as discussed.

## 2014-07-01 ENCOUNTER — Other Ambulatory Visit: Payer: Self-pay | Admitting: Diagnostic Neuroimaging

## 2014-07-01 DIAGNOSIS — G43009 Migraine without aura, not intractable, without status migrainosus: Secondary | ICD-10-CM

## 2014-07-03 ENCOUNTER — Telehealth: Payer: Self-pay | Admitting: *Deleted

## 2014-07-03 DIAGNOSIS — G43009 Migraine without aura, not intractable, without status migrainosus: Secondary | ICD-10-CM

## 2014-07-03 NOTE — Telephone Encounter (Signed)
[facility] pharmacy is calling stating that they still have not received an Rx for this patients Topamax. The pharmacy would like a call for a verbal order. Please advise

## 2014-07-04 ENCOUNTER — Other Ambulatory Visit: Payer: Self-pay | Admitting: *Deleted

## 2014-07-07 ENCOUNTER — Other Ambulatory Visit: Payer: Self-pay | Admitting: Family Medicine

## 2014-07-07 DIAGNOSIS — F411 Generalized anxiety disorder: Secondary | ICD-10-CM

## 2014-07-07 NOTE — Telephone Encounter (Signed)
Time for a follow- up, will call and let her know

## 2014-07-10 ENCOUNTER — Telehealth: Payer: Self-pay | Admitting: Oncology

## 2014-07-10 NOTE — Telephone Encounter (Signed)
pt cld to r/s appt-gave pt r/s appt time & date-pt understood

## 2014-07-14 ENCOUNTER — Ambulatory Visit: Payer: Self-pay | Admitting: Family Medicine

## 2014-07-18 MED ORDER — TOPIRAMATE 50 MG PO TABS: 100.0000 mg | ORAL_TABLET | Freq: Two times a day (BID) | ORAL | Status: DC

## 2014-07-24 ENCOUNTER — Other Ambulatory Visit: Payer: Self-pay

## 2014-07-24 ENCOUNTER — Ambulatory Visit: Payer: Self-pay | Admitting: Oncology

## 2014-07-25 ENCOUNTER — Other Ambulatory Visit: Payer: Self-pay | Admitting: Nurse Practitioner

## 2014-08-04 ENCOUNTER — Other Ambulatory Visit: Payer: Self-pay | Admitting: Oncology

## 2014-08-04 ENCOUNTER — Ambulatory Visit (INDEPENDENT_AMBULATORY_CARE_PROVIDER_SITE_OTHER): Payer: Medicare Other | Admitting: Family Medicine

## 2014-08-04 ENCOUNTER — Encounter: Payer: Self-pay | Admitting: Family Medicine

## 2014-08-04 VITALS — BP 100/78 | HR 99 | Temp 98.6°F | Resp 16 | Ht 66.25 in | Wt 101.8 lb

## 2014-08-04 DIAGNOSIS — F5000 Anorexia nervosa, unspecified: Secondary | ICD-10-CM | POA: Diagnosis not present

## 2014-08-04 NOTE — Patient Instructions (Signed)
I will set you up with nutrionist at [facility].

## 2014-08-04 NOTE — Progress Notes (Signed)
[facility] [address] [place] [address] [phone]  Date:  08/04/2014   Name:  Crystal [name]   DOB:  04-Mar-1976   MRN:  [id number]  PCP:  Lamar Blinks, MD    Chief Complaint: Follow-up   History of Present Illness:  Crystal [name] is a 39 y.o. very pleasant female patient who presents with the following:  This lady with a history of anxiety/ depression and breast cancer dx 2012 is here today to follow-up on her weight.  Crystal. Marjie Skiff was quite concerned about her weight back in February; per his OV notes from 2/17:  PLAN:  Crystal [name] continues to lose weight. She refuses to meet with a nutritionist. She has no insurance and no Medicaid and she tells me her application for disability is not due for review until mid March.   I think if she continues to lose weight at this rate she is going to get into an irreversible decline. I offered to admit her today so she can get the help she needs, but she wants to just "it a little better and have some boost period". She agreed to trying prednisone 10 mg in the morning, as an appetite stimulant.   I'm going to see her again March 24. If she has gained no weight by that time I will plan on admission.   Weight 05/2013:  144  Jun missed her appt 3/24- could not make this appt.  She states that she has changed her diet and now avoids all meat.  She also states that she feels stressed and is therefore not eating. She recently was approved for disabilty but is not sure if she will get medicare yet.    She does admit that the "likes being this size, but I'm not anorexic."   We talked about this a lot- she admits that she thinks her current weight and size is healthy and attractive, but that her husband Crystal [name] is worried and wants her to gain weight.  She has fluctuated in her weight a lot over the years.  "He (Crystal. Jana [name]) wants me in the 120- 130 range but I just don't want to weight that much. That is too much."   Wt Readings from Last 3 Encounters:  08/04/14 101 lb 12.8 oz (46.176 kg)  06/19/14 104 lb 6.4 oz (47.356 kg)  06/18/14 104 lb (47.174 kg)     Patient Active Problem List   Diagnosis Date Noted  . Malnutrition, calorie 12/03/2013  . Migraines 05/21/2013  . Breast cancer of upper-outer quadrant of right female breast 03/21/2013  . Chronic pain 01/09/2013  . DVT of upper extremity (deep vein thrombosis) 01/02/2013  . Arm edema 12/27/2012  . Neuropathic pain 12/27/2012  . Chronic female pelvic pain 08/21/2012  . Fatigue 08/20/2012  . Chemotherapy induced cardiomyopathy 09/08/2011  . Generalized convulsive seizure 07/13/2011  . Seizure disorder 07/13/2011  . Migraine 07/13/2011  . Anxiety disorder 07/13/2011  . Depression 07/13/2011  . Hx MRSA infection 04/13/2011    Past Medical History  Diagnosis Date  . Status post chemotherapy     docetaxel/carboplatin/trastuzumab  . Anxiety   . Hx MRSA infection 03/28/11    Skin  . History of breast cancer DX JUNE 2012 W/ RIGHT BREAST INVASIVE DUCTAL CA  STAGE IIB---  S/P BILATERAL MASTECTOMIES AND RIGHT NODE DISSECTION    ONCOLOGIST-- Crystal [name]--  CHEMO ENDED [place] 2012 / RADIATION ENDED MAR 2013--  CURRENT ON TAMOXIFEN AND ZOLADEX  .  Family history of anesthesia complication     MOTHER PONV  . Hot flashes due to tamoxifen   . History of idiopathic seizure     DX 2008 --  LAST ONE 2012--  NO ISSUES SINCE AND NO MEDS. -- PT STATES DOCTORS FELT IT WAS STRESS RELATED DUE TO HEALTH ISSUES  . Bipolar affective disorder, mixed   . GERD (gastroesophageal reflux disease)     OCCASIONALLY TAKE ZANTAC  . Left ovarian cyst   . Right lower quadrant abdominal pain   . Frequency of urination   . Urgency of urination   . Strains to urinate   . Nocturia   . Heart palpitations   . Cancer   . Depression   . Seizures     Past Surgical History  Procedure Laterality Date  . Wisdom tooth extraction    . Biopsy breast  10/14/2010    right  needle core biopsy  . Portacath placement  11/16/2010    placement of left subclavian port  . I&d extremity  09/26/2011    Procedure: IRRIGATION AND DEBRIDEMENT EXTREMITY;  Surgeon: Tennis Must, MD;  Location: [facility];  Service: Orthopedics;  Laterality: Right;  I&D right hand cat bite wound  . Breast surgery  11/16/2010    bilateral mastectomy+ right axillary node dissection,T1cN1a, Her2+,ERPR+  (right breast invasive ductal carcinoma)  . Transthoracic echocardiogram  05-28-2012    LVF NORMAL/  EF 55-60%  . Cysto with hydrodistension N/A 07/23/2012    Procedure: CYSTOSCOPY/HYDRODISTENSION instillation of marcaine and pyridium;  Surgeon: Reece Packer, MD;  Location: [facility];  Service: Urology;  Laterality: N/A;  INSTILLATION      History  Substance Use Topics  . Smoking status: Current Every Day Smoker -- 1.00 packs/day for 20 years    Types: Cigarettes  . Smokeless tobacco: Never Used  . Alcohol Use: Yes    Family History  Problem Relation Age of Onset  . Cancer Maternal Grandfather   . Mental illness Brother   . Mental illness Son     Allergies  Allergen Reactions  . Tramadol Other (See Comments)    Seizures  . Phenytoin Nausea And Vomiting  . Thorazine [Chlorpromazine Hcl] Hives  . Ciprofloxacin Nausea And Vomiting  . Vancomycin Rash    Possible rash reported by MD    Medication list has been reviewed and updated.  Current Outpatient Prescriptions on File Prior to Visit  Medication Sig Dispense Refill  . ALPRAZolam (XANAX) 1 MG tablet TAKE ONE TABLET BY MOUTH THREE TIMES A DAY AS NEEDED FOR ANXIETY  90 tablet 0  . citalopram (CELEXA) 40 MG tablet Take 1 tablet (40 mg total) by mouth daily. 90 tablet 3  . Cyanocobalamin (VITAMIN B 12 PO) Take 1 tablet by mouth 2 (two) times daily.    Marland Kitchen esomeprazole (NEXIUM) 20 MG capsule Take 20 mg by mouth daily at 12 noon.    . fluticasone (FLONASE) 50 MCG/ACT nasal spray Place 2 sprays into both nostrils  daily. 16 g 6  . goserelin (ZOLADEX) 10.8 MG injection Inject 10.8 mg into the skin every 3 (three) months.    Marland Kitchen letrozole (FEMARA) 2.5 MG tablet Take 1 tablet (2.5 mg total) by mouth daily. 90 tablet 4  . topiramate (TOPAMAX) 50 MG tablet Take 2 tablets (100 mg total) by mouth 2 (two) times daily. 120 tablet 5  . predniSONE (DELTASONE) 10 MG tablet Take 1 tablet (10 mg total) by mouth daily with breakfast. (  Patient not taking: Reported on 08/04/2014) 90 tablet 3   No current facility-administered medications on file prior to visit.    Review of Systems:  As per HPI- otherwise negative.   Physical Examination: Filed Vitals:   08/04/14 1002  BP: 100/78  Pulse: 99  Temp: 98.6 F (37 C)  Resp: 16   Filed Vitals:   08/04/14 1002  Height: 5' 6.25" (1.683 m)  Weight: 101 lb 12.8 oz (46.176 kg)   Body mass index is 16.3 kg/(m^2). Ideal Body Weight: Weight in (lb) to have BMI = 25: 155.7  GEN: WDWN, NAD, Non-toxic, A & O x 3, appears very thin HEENT: Atraumatic, Normocephalic. Neck supple. No masses, No LAD. Ears and Nose: No external deformity. CV: RRR, No M/G/R. No JVD. No thrill. No extra heart sounds. PULM: CTA B, no wheezes, crackles, rhonchi. No retractions. No resp. distress. No accessory muscle use. EXTR: No c/c/e NEURO Normal gait.  PSYCH: Normally interactive. Conversant. Tearful some during conversation  Assessment and Plan: Anorexia nervosa  Discussed in detail with Crystal [name] today.  She does seem to realize on reflection that her current body self- image is not realistic, and that she does need to gain weight to be healthy.  She does not want to return to eating meat due to concerns about animal welfare which is understandable.  On discussion she realized that as she has lost a lot of control recently (with cancer, etc) and that she may have become anorexic as a way to take control of part of her life. She does not want to end up in the hospital for treatment of anorexia  and is willing to see the nutritionist at the cancer center again- she has seen this person in the past.  Asked her to speak with her husband Crystal [name], who is quite supportive, and will ask Crystal. Jana [name] to help me set her back up with her nutritionist  Plan follow-up in 2-3 weeks Asked her not to weight herself She did stop taking the prednisone a few weeks ago due to irritability   Signed Lamar Blinks, MD

## 2014-08-05 ENCOUNTER — Telehealth: Payer: Self-pay | Admitting: Oncology

## 2014-08-05 NOTE — Telephone Encounter (Signed)
per pof to sch pt appt-cld & spoke to pt to adv of appt time & date-pt understood °

## 2014-08-13 ENCOUNTER — Ambulatory Visit (HOSPITAL_BASED_OUTPATIENT_CLINIC_OR_DEPARTMENT_OTHER): Payer: Self-pay

## 2014-08-13 ENCOUNTER — Telehealth: Payer: Self-pay

## 2014-08-13 VITALS — BP 143/91 | HR 68 | Temp 98.1°F | Ht 64.0 in | Wt 106.6 lb

## 2014-08-13 DIAGNOSIS — I82623 Acute embolism and thrombosis of deep veins of upper extremity, bilateral: Secondary | ICD-10-CM

## 2014-08-13 DIAGNOSIS — C50411 Malignant neoplasm of upper-outer quadrant of right female breast: Secondary | ICD-10-CM

## 2014-08-13 DIAGNOSIS — Z5111 Encounter for antineoplastic chemotherapy: Secondary | ICD-10-CM

## 2014-08-13 MED ORDER — GOSERELIN ACETATE 10.8 MG ~~LOC~~ IMPL
10.8000 mg | DRUG_IMPLANT | SUBCUTANEOUS | Status: DC
  Administered 2014-08-13: 10.8 mg via SUBCUTANEOUS
  Filled 2014-08-13: qty 10.8

## 2014-08-13 NOTE — Progress Notes (Signed)
Crystal [name] noted that her weight on April 4th was decreased from her usual weight.  She was weighed at the [facility].  Concerned that they are thinking that she in not eating.  Her weights here have been consistantly around 110-106.

## 2014-08-13 NOTE — Telephone Encounter (Signed)
Dr. Lorelei Pont, Patient came into office for a weight check only today at 104.  Her weight was 106.0 She said she wanted you to know as you were concerned about her weight.

## 2014-08-14 NOTE — Telephone Encounter (Signed)
Called her back- thanks for the update.  I am really glad to hear this, she is going to see oncology soon  Wt Readings from Last 3 Encounters:  08/13/14 106 lb 9 oz (48.336 kg)  08/04/14 101 lb 12.8 oz (46.176 kg)  06/19/14 104 lb 6.4 oz (47.356 kg)

## 2014-08-18 ENCOUNTER — Telehealth: Payer: Self-pay | Admitting: *Deleted

## 2014-08-18 ENCOUNTER — Other Ambulatory Visit: Payer: Self-pay | Admitting: *Deleted

## 2014-08-18 ENCOUNTER — Other Ambulatory Visit: Payer: Self-pay | Admitting: Family Medicine

## 2014-08-18 ENCOUNTER — Telehealth: Payer: Self-pay | Admitting: Oncology

## 2014-08-18 ENCOUNTER — Encounter: Payer: Self-pay | Admitting: Nutrition

## 2014-08-18 DIAGNOSIS — E46 Unspecified protein-calorie malnutrition: Secondary | ICD-10-CM

## 2014-08-18 DIAGNOSIS — F411 Generalized anxiety disorder: Secondary | ICD-10-CM

## 2014-08-18 NOTE — Telephone Encounter (Signed)
This RN was contacted by nutritionist at the [facility] per referral for nutrition consult.  Per review recommendation for pt to be best served is for referral to be thru the [facility] office.  This RN spoke with pt per above- with pt stating she would prefer not to go somewhere else due to " I have no insurance and I owe at some places "  This RN discussed with pt concerns per noted weight loss and health issue that could be life threatening- and MD's concern.  Per phone conversation Crystal [name] states she understands the above and is trying to make better food choices.  Plan at present is for her to follow up as scheduled with Dr Jannifer Rodney and then if possibly see a provider monthly at time of zoladex injection for weight concerns.  Of note per this call- Crystal [name] states she has had an onset of severe back pain and would like to be seen.  This RN scheduled pt with symptom management per above.

## 2014-08-18 NOTE — Telephone Encounter (Signed)
pt cld to r/s nutition appt-gave pt updated time & date

## 2014-08-19 ENCOUNTER — Encounter: Payer: Self-pay | Admitting: Nurse Practitioner

## 2014-08-19 ENCOUNTER — Ambulatory Visit (HOSPITAL_BASED_OUTPATIENT_CLINIC_OR_DEPARTMENT_OTHER): Payer: Self-pay | Admitting: Nurse Practitioner

## 2014-08-19 VITALS — BP 123/97 | HR 85 | Temp 98.6°F | Resp 18 | Wt 104.9 lb

## 2014-08-19 DIAGNOSIS — M546 Pain in thoracic spine: Secondary | ICD-10-CM

## 2014-08-19 DIAGNOSIS — M549 Dorsalgia, unspecified: Secondary | ICD-10-CM | POA: Insufficient documentation

## 2014-08-19 DIAGNOSIS — C50411 Malignant neoplasm of upper-outer quadrant of right female breast: Secondary | ICD-10-CM

## 2014-08-19 DIAGNOSIS — F5000 Anorexia nervosa, unspecified: Secondary | ICD-10-CM

## 2014-08-19 MED ORDER — HYDROCODONE-ACETAMINOPHEN 5-325 MG PO TABS: 1.0000 | ORAL_TABLET | Freq: Four times a day (QID) | ORAL | Status: DC | PRN

## 2014-08-19 NOTE — Assessment & Plan Note (Signed)
Patient has a history of chronic anorexia.  Patient has met with a cancer Center nutritionist in the past; but has no interest in meeting with the nutritionist at this time.  She is very happy to report that she has increased her weight from 101.1 up to 104.9 today.  She has also recently met with her primary care physician Dr. Lorelei Pont to further review of her issues with anorexia.

## 2014-08-19 NOTE — Progress Notes (Signed)
SYMPTOM MANAGEMENT CLINIC   HPI: Crystal [name] 39 y.o. female diagnosed with breast cancer.  Patient is status post bilateral mastectomy, chemotherapy, and radiation therapy.  Patient is currently undergoing letrozole oral therapy and Zoladex injections.  Patient called the cancer Center today requesting urgent care visit. Patient states that she alternates sleeping regularly in her bed and on the couch at night.  Patient states that she was sleeping on the couch approximately 4 days ago; and woke up with some fairly severe upper back pain directly between her shoulder blades.  Patient states that the pain radiates from her thoracic spine to her bilateral shoulder blades; with increased pain with any movement of her arms.  Patient states that she has tried some of her husband's Skelaxin muscle relaxant at home with minimal effectiveness.  She denies any known injury or trauma.  Patient feels that she just slept wrong on the couch.  She denies any chest pain, chest pressure, shortness of breath, or pain with inspiration.  HPI  ROS  Past Medical History  Diagnosis Date  . Status post chemotherapy     docetaxel/carboplatin/trastuzumab  . Anxiety   . Hx MRSA infection 03/28/11    Skin  . History of breast cancer DX JUNE 2012 W/ RIGHT BREAST INVASIVE DUCTAL CA  STAGE IIB---  S/P BILATERAL MASTECTOMIES AND RIGHT NODE DISSECTION    ONCOLOGIST-- DR Jana Hakim--  CHEMO ENDED [place] 2012 / RADIATION ENDED MAR 2013--  CURRENT ON TAMOXIFEN AND ZOLADEX  . Family history of anesthesia complication     MOTHER PONV  . Hot flashes due to tamoxifen   . History of idiopathic seizure     DX 2008 --  LAST ONE 2012--  NO ISSUES SINCE AND NO MEDS. -- PT STATES DOCTORS FELT IT WAS STRESS RELATED DUE TO HEALTH ISSUES  . Bipolar affective disorder, mixed   . GERD (gastroesophageal reflux disease)     OCCASIONALLY TAKE ZANTAC  . Left ovarian cyst   . Right lower quadrant abdominal pain   . Frequency of  urination   . Urgency of urination   . Strains to urinate   . Nocturia   . Heart palpitations   . Cancer   . Depression   . Seizures     Past Surgical History  Procedure Laterality Date  . Wisdom tooth extraction    . Biopsy breast  10/14/2010    right needle core biopsy  . Portacath placement  11/16/2010    placement of left subclavian port  . I&d extremity  09/26/2011    Procedure: IRRIGATION AND DEBRIDEMENT EXTREMITY;  Surgeon: Tennis Must, MD;  Location: [facility];  Service: Orthopedics;  Laterality: Right;  I&D right hand cat bite wound  . Breast surgery  11/16/2010    bilateral mastectomy+ right axillary node dissection,T1cN1a, Her2+,ERPR+  (right breast invasive ductal carcinoma)  . Transthoracic echocardiogram  05-28-2012    LVF NORMAL/  EF 55-60%  . Cysto with hydrodistension N/A 07/23/2012    Procedure: CYSTOSCOPY/HYDRODISTENSION instillation of marcaine and pyridium;  Surgeon: Reece Packer, MD;  Location: [facility];  Service: Urology;  Laterality: N/A;  INSTILLATION      has Hx MRSA infection; Generalized convulsive seizure; Seizure disorder; Migraine; Anxiety disorder; Depression; Chemotherapy induced cardiomyopathy; Fatigue; Chronic female pelvic pain; Arm edema; Neuropathic pain; DVT of upper extremity (deep vein thrombosis); Chronic pain; Breast cancer of upper-outer quadrant of right female breast; Migraines; Malnutrition, calorie; Anorexia nervosa; and Back pain on her  problem list.    is allergic to tramadol; phenytoin; thorazine; ciprofloxacin; and vancomycin.    Medication List       This list is accurate as of: 08/19/14 10:04 AM.  Always use your most recent med list.               ALPRAZolam 1 MG tablet  Commonly known as:  XANAX  TAKE ONE TABLET BY MOUTH THREE TIMES DAILY AS NEEDED FOR ANXIETY     citalopram 40 MG tablet  Commonly known as:  CELEXA  Take 1 tablet (40 mg total) by mouth daily.     esomeprazole 20 MG capsule    Commonly known as:  NEXIUM  Take 20 mg by mouth daily at 12 noon.     fluticasone 50 MCG/ACT nasal spray  Commonly known as:  FLONASE  Place 2 sprays into both nostrils daily.     goserelin 10.8 MG injection  Commonly known as:  ZOLADEX  Inject 10.8 mg into the skin every 3 (three) months.     HYDROcodone-acetaminophen 5-325 MG per tablet  Commonly known as:  NORCO/VICODIN  Take 1-2 tablets by mouth every 6 (six) hours as needed for moderate pain.     letrozole 2.5 MG tablet  Commonly known as:  FEMARA  Take 1 tablet (2.5 mg total) by mouth daily.     topiramate 50 MG tablet  Commonly known as:  TOPAMAX  Take 2 tablets (100 mg total) by mouth 2 (two) times daily.     VITAMIN B 12 PO  Take 1 tablet by mouth 2 (two) times daily.         PHYSICAL EXAMINATION  Oncology Vitals 08/19/2014 08/13/2014 08/04/2014 06/19/2014 06/18/2014 06/09/2014 05/21/2014  Height - 163 cm 168 cm 170 cm 168 cm 168 cm -  Weight 47.582 kg 48.336 kg 46.176 kg 47.356 kg 47.174 kg 48.081 kg -  Weight (lbs) 104 lbs 14 oz 106 lbs 9 oz 101 lbs 13 oz 104 lbs 6 oz 104 lbs 106 lbs -  BMI (kg/m2) - 18.29 kg/m2 16.31 kg/m2 16.35 kg/m2 16.79 kg/m2 17.11 kg/m2 -  Temp 98.6 98.1 98.6 - 98 98.4 98.5  Pulse 85 68 99 119 79 108 67  Resp 18 - 16 - 18 16 -  Resp (Historical as of 12/01/11) - - - - - - -  SpO2 100 - 100 - - 99 -  BSA (m2) - 1.48 m2 1.47 m2 1.5 m2 1.48 m2 1.5 m2 -   BP Readings from Last 3 Encounters:  08/19/14 123/97  08/13/14 143/91  08/04/14 100/78    Physical Exam  Constitutional: She is oriented to person, place, and time. She appears malnourished. She appears unhealthy. She appears cachectic.  HENT:  Head: Normocephalic and atraumatic.  Mouth/Throat: Oropharynx is clear and moist.  Eyes: Conjunctivae and EOM are normal. Pupils are equal, round, and reactive to light. Right eye exhibits no discharge. Left eye exhibits no discharge. No scleral icterus.  Neck: Normal range of motion. Neck supple. No  JVD present. No tracheal deviation present. No thyromegaly present.  Cardiovascular: Normal rate, regular rhythm, normal heart sounds and intact distal pulses.   Pulmonary/Chest: Breath sounds normal. No respiratory distress. She has no wheezes. She has no rales. She exhibits no tenderness.  Abdominal: Soft. Bowel sounds are normal. She exhibits no distension and no mass. There is no tenderness. There is no rebound and no guarding.  Musculoskeletal: Normal range of motion. She exhibits tenderness. She exhibits  no edema.  Tenderness with palp to upper mid back; as well as area between bilateral shoulder blades.  Patient observed with full range of motion.  No evidence of injury or trauma.  Lymphadenopathy:    She has no cervical adenopathy.  Neurological: She is alert and oriented to person, place, and time. Gait normal.  Skin: Skin is warm and dry. No rash noted. No erythema. There is pallor.  Psychiatric: Affect normal.  Nursing note and vitals reviewed.   LABORATORY DATA:. No visits with results within 3 Day(s) from this visit. Latest known visit with results is:  Office Visit on 06/09/2014  Component Date Value Ref Range Status  . Influenza A, POC 06/09/2014 Negative   Final  . Influenza B, POC 06/09/2014 Negative   Final     RADIOGRAPHIC STUDIES: No results found.  ASSESSMENT/PLAN:    Breast cancer of upper-outer quadrant of right female breast Patient is status post bilateral mastectomy, chemotherapy, and radiation therapy.  Currently undergoing letrozole oral therapy; and receives Zoladex injections on an every three-month basis.  She is scheduled for her next Zoladex injection on 11/05/2014.  Patient also has plans to follow-up with Dr. Jana Hakim on 09/03/2014.   Anorexia nervosa Patient has a history of chronic anorexia.  Patient has met with a cancer Center nutritionist in the past; but has no interest in meeting with the nutritionist at this time.  She is very happy to  report that she has increased her weight from 101.1 up to 104.9 today.  She has also recently met with her primary care physician Dr. Lorelei Pont to further review of her issues with anorexia.   Back pain Patient states that she alternate sleeping regularly in her bed and on the couch at night.  Patient states that she was sleeping on the couch approximately 4 days ago; and woke up with some fairly severe upper back pain directly between her shoulder blades.  Patient states that the pain radiates from her thoracic spine to her bilateral shoulder blades; with increased pain with any movement of her arms.  Patient states that she has tried some of her husband's Skelaxin muscle relaxant at home with minimal effectiveness.  She denies any known injury or trauma.  Patient feels that she just slept wrong on the couch.  She denies any chest pain, chest pressure, shortness of breath, or pain with inspiration.  On exam-no obvious injury to mid back area.  However, patient does appear to have some muscle spasming to her bilateral shoulder blade areas which are tender to touch.  Patient was observed with full range of motion with back; and bilateral upper extremities.  Patient was observed able to climb onto the exam table with no difficulty whatsoever.  Prescribed hydrocodone in a small amount for patient to try.  Also advised the patient alternate the hydrocodone with ibuprofen on a regular basis for the next several days.  Advised patient to call/return if symptoms do not improve by the end of the week.  May very well need imaging studies for further evaluation if pain does not improve.   Patient stated understanding of all instructions; and was in agreement with this plan of care. The patient knows to call the clinic with any problems, questions or concerns.   Review/collaboration with Dr. Jana Hakim regarding all aspects of patient's visit today.   Total time spent with patient was 25 minutes;  with greater than  75 percent of that time spent in face to face counseling regarding patient's  symptoms,  and coordination of care and follow up.  Disclaimer: This note was dictated with voice recognition software. Similar sounding words can inadvertently be transcribed and may not be corrected upon review.   Drue Second, NP 08/19/2014

## 2014-08-19 NOTE — Assessment & Plan Note (Signed)
Patient is status post bilateral mastectomy, chemotherapy, and radiation therapy.  Currently undergoing letrozole oral therapy; and receives Zoladex injections on an every three-month basis.  She is scheduled for her next Zoladex injection on 11/05/2014.  Patient also has plans to follow-up with Dr. Jana Hakim on 09/03/2014.

## 2014-08-19 NOTE — Assessment & Plan Note (Signed)
Patient states that she alternate sleeping regularly in her bed and on the couch at night.  Patient states that she was sleeping on the couch approximately 4 days ago; and woke up with some fairly severe upper back pain directly between her shoulder blades.  Patient states that the pain radiates from her thoracic spine to her bilateral shoulder blades; with increased pain with any movement of her arms.  Patient states that she has tried some of her husband's Skelaxin muscle relaxant at home with minimal effectiveness.  She denies any known injury or trauma.  Patient feels that she just slept wrong on the couch.  She denies any chest pain, chest pressure, shortness of breath, or pain with inspiration.  On exam-no obvious injury to mid back area.  However, patient does appear to have some muscle spasming to her bilateral shoulder blade areas which are tender to touch.  Patient was observed with full range of motion with back; and bilateral upper extremities.  Patient was observed able to climb onto the exam table with no difficulty whatsoever.  Prescribed hydrocodone in a small amount for patient to try.  Also advised the patient alternate the hydrocodone with ibuprofen on a regular basis for the next several days.  Advised patient to call/return if symptoms do not improve by the end of the week.  May very well need imaging studies for further evaluation if pain does not improve.

## 2014-08-21 ENCOUNTER — Telehealth: Payer: Self-pay | Admitting: *Deleted

## 2014-08-21 NOTE — Telephone Encounter (Signed)
Spoke to pt- she is feeling much better. Still struggling with knots in her back. She is able to move better today but still has back pain. Suggested massage and pt is going to consider this. She understands to call if any questions or concerns.

## 2014-08-22 ENCOUNTER — Encounter: Payer: Self-pay | Admitting: Nutrition

## 2014-09-03 ENCOUNTER — Ambulatory Visit (HOSPITAL_BASED_OUTPATIENT_CLINIC_OR_DEPARTMENT_OTHER): Payer: Self-pay | Admitting: Oncology

## 2014-09-03 VITALS — BP 115/90 | HR 70 | Temp 97.8°F | Resp 18 | Ht 64.0 in | Wt 107.0 lb

## 2014-09-03 DIAGNOSIS — Z86718 Personal history of other venous thrombosis and embolism: Secondary | ICD-10-CM

## 2014-09-03 DIAGNOSIS — C50411 Malignant neoplasm of upper-outer quadrant of right female breast: Secondary | ICD-10-CM

## 2014-09-03 DIAGNOSIS — G40909 Epilepsy, unspecified, not intractable, without status epilepticus: Secondary | ICD-10-CM

## 2014-09-03 DIAGNOSIS — C773 Secondary and unspecified malignant neoplasm of axilla and upper limb lymph nodes: Secondary | ICD-10-CM

## 2014-09-03 DIAGNOSIS — E46 Unspecified protein-calorie malnutrition: Secondary | ICD-10-CM

## 2014-09-03 DIAGNOSIS — Z170 Estrogen receptor positive status [ER+]: Secondary | ICD-10-CM

## 2014-09-03 DIAGNOSIS — G43009 Migraine without aura, not intractable, without status migrainosus: Secondary | ICD-10-CM

## 2014-09-03 DIAGNOSIS — I82629 Acute embolism and thrombosis of deep veins of unspecified upper extremity: Secondary | ICD-10-CM

## 2014-09-03 NOTE — Progress Notes (Signed)
ID: Crystal [name]   DOB: 1975/05/03  MR#: [id number]  ANV#:[id number]  HTX:[id number],TRVUYEB, MD GYN: SULorine Bears, MD OTHER:  Elmarie Mainland   CHIEF COMPLAINT:  Hx of Right Breast Cancer CURRENT THERAPY: Goserelin,  letrozole   BREAST CANCER HISTORY: From the initial intake note:  "The patient is a 39 year old [name] who noted a mass in her right breast and brought it to her primary care physician's attention June of 2012. She was set up for mammography at [facility], where an area of calcifications highly suspicious for carcinoma was biopsied. This was in the upper outer quadrant and it measured 1.7 cm mammographically and 1.8 cm by ultrasonography. The pathology report ([id number]) showed a high-grade invasive ductal carcinoma estrogen receptor positive at 64% progesterone receptor positive at 18%, and HER-2 amplified with a ratio by CISH of 5.24. MIB-1-1 was 65%. A second mass biopsied at the same time was also triple positive.  Given her multiple masses in the right breast, mastectomy was recommended. The patient actually opted for bilateral mastectomies and this was performed together with a right axillary lymph node dissection under Dr. Star Age on 11/16/2010. The final pathology [id number]) showed, on the left, no evidence of cancer. On the right the patient had a high-grade invasive ductal carcinoma measuring 1.9 cm, with negative margins, grade 3, involving 2 of 16 lymph nodes (stage IIB)."  Her subsequent history is as detailed below.  INTERVAL HISTORY:  Crystal [name] returns today for followup of her breast cancer. She is very pleased that having managed to gain 3 pounds over the last month. If anything her clothes are lighter today, since it was considerably colder 3 months ago. She is wearing the same shoes. I do think she has stopped the weight loss spiral that she was in, although she is still certainly borderline. She continues on letrozole, after running out of  pills for 3 days. She is tolerating that well. She has no problems from the goserelin she receives every 3 months   REVIEW OF SYSTEMS: She has noted a significant problem concentrating. She will start the laundry, go to something else come back and then notice that she actually forgot to start the machine. She will be in a room and forget what whyy she went in there. She is not aware of which of the week it is and she frequently ends up asking Glendell Docker several times the same question. Her headaches are well-controlled on her current medication. She has not had any intercurrent seizures. She came in for severe back pain and was given a few hydrocodone, which she took, but the problem has resolved. A detailed review of systems today was otherwise stable   PAST MEDICAL HISTORY: Past Medical History  Diagnosis Date  . Status post chemotherapy     docetaxel/carboplatin/trastuzumab  . Anxiety   . Hx MRSA infection 03/28/11    Skin  . History of breast cancer DX JUNE 2012 W/ RIGHT BREAST INVASIVE DUCTAL CA  STAGE IIB---  S/P BILATERAL MASTECTOMIES AND RIGHT NODE DISSECTION    ONCOLOGIST-- DR Jana Hakim--  CHEMO ENDED [place] 2012 / RADIATION ENDED MAR 2013--  CURRENT ON TAMOXIFEN AND ZOLADEX  . Family history of anesthesia complication     MOTHER PONV  . Hot flashes due to tamoxifen   . History of idiopathic seizure     DX 2008 --  LAST ONE 2012--  NO ISSUES SINCE AND NO MEDS. -- PT STATES DOCTORS FELT IT WAS STRESS RELATED  DUE TO HEALTH ISSUES  . Bipolar affective disorder, mixed   . GERD (gastroesophageal reflux disease)     OCCASIONALLY TAKE ZANTAC  . Left ovarian cyst   . Right lower quadrant abdominal pain   . Frequency of urination   . Urgency of urination   . Strains to urinate   . Nocturia   . Heart palpitations   . Cancer   . Depression   . Seizures   1. History of seizures. The patient has been thoroughly evaluated both here by Dr. Gaynell Face and at [facility] for this. Among many studies,  she had an MRI of the brain on August 28th. This showed an incidental solitary small lesion in the left frontoparietal white matter. It was not felt to be suggestive of multiple sclerosis. A noncontrasted CT in August 2010 was negative. The patient has been off all seizure medications now for 2 months. There has been no evidence of seizure recurrence. 2. Complex psychology history (please refer to Dr. Iona Coach note in the E-chart from August 29, 2009) with evidence of anxiety disorder, depression and psychosis. The patient is currently on no medications for this and denies any of these symptoms at present. 3. History of migraines. 4. Remote history of multidrug abuse. 5. Status post wisdom teeth extraction.         History of MRSA skin infection   PAST SURGICAL HISTORY: Past Surgical History  Procedure Laterality Date  . Wisdom tooth extraction    . Biopsy breast  10/14/2010    right needle core biopsy  . Portacath placement  11/16/2010    placement of left subclavian port  . I&d extremity  09/26/2011    Procedure: IRRIGATION AND DEBRIDEMENT EXTREMITY;  Surgeon: Tennis Must, MD;  Location: [facility];  Service: Orthopedics;  Laterality: Right;  I&D right hand cat bite wound  . Breast surgery  11/16/2010    bilateral mastectomy+ right axillary node dissection,T1cN1a, Her2+,ERPR+  (right breast invasive ductal carcinoma)  . Transthoracic echocardiogram  05-28-2012    LVF NORMAL/  EF 55-60%  . Cysto with hydrodistension N/A 07/23/2012    Procedure: CYSTOSCOPY/HYDRODISTENSION instillation of marcaine and pyridium;  Surgeon: Reece Packer, MD;  Location: [facility];  Service: Urology;  Laterality: N/A;  INSTILLATION      FAMILY HISTORY Family History  Problem Relation Age of Onset  . Cancer Maternal Grandfather   . Mental illness Brother   . Mental illness Son   The patient's mother is living, in her mid 42s, with no history of breast or ovarian cancer. The patient does not  know her biological father. The patient has an older brother with bipolar disease, and according to the patient, borderline schizophrenia.   GYNECOLOGIC HISTORY: (Updated January 2015) Menarche at age 23. She is G1, P1 with first pregnancy to term at age 57. She was not menopausal, and was still having regular periods at the time of her breast cancer diagnosis. She continues on every three-month goserelin.  SOCIAL HISTORY:  (Updated 05/21/2013) She used to work in a [employer] but had to leave that job because of the seizure problem. She's currently unemployed. Her husband , Glendell Docker, works as a Development worker, international aid. Lyndsi has a son, Liane Comber, who is 5 and recently moved in with his father. [name], according to the patient, has significant nausea problems. The patient attends a local [place]. Peggyann Juba is pastor.    ADVANCED DIRECTIVES: Not in place  HEALTH MAINTENANCE:  (Updated 05/21/2013) History  Substance Use Topics  . Smoking status: Current Every Day Smoker -- 1.00 packs/day for 20 years    Types: Cigarettes  . Smokeless tobacco: Never Used  . Alcohol Use: Yes     Colonoscopy: Never  PAP: July 2012  Bone density:  Never  Lipid panel: Not on file  Allergies  Allergen Reactions  . Tramadol Other (See Comments)    Seizures  . Phenytoin Nausea And Vomiting  . Thorazine [Chlorpromazine Hcl] Hives  . Ciprofloxacin Nausea And Vomiting  . Vancomycin Rash    Possible rash reported by MD    Current Outpatient Prescriptions  Medication Sig Dispense Refill  . ALPRAZolam (XANAX) 1 MG tablet TAKE ONE TABLET BY MOUTH THREE TIMES DAILY AS NEEDED FOR ANXIETY 90 tablet 0  . citalopram (CELEXA) 40 MG tablet Take 1 tablet (40 mg total) by mouth daily. 90 tablet 3  . Cyanocobalamin (VITAMIN B 12 PO) Take 1 tablet by mouth 2 (two) times daily.    Marland Kitchen esomeprazole (NEXIUM) 20 MG capsule Take 20 mg by mouth daily at 12 noon.    . fluticasone (FLONASE) 50 MCG/ACT nasal spray Place 2 sprays  into both nostrils daily. 16 g 6  . goserelin (ZOLADEX) 10.8 MG injection Inject 10.8 mg into the skin every 3 (three) months.    Marland Kitchen HYDROcodone-acetaminophen (NORCO/VICODIN) 5-325 MG per tablet Take 1-2 tablets by mouth every 6 (six) hours as needed for moderate pain. 30 tablet 0  . letrozole (FEMARA) 2.5 MG tablet Take 1 tablet (2.5 mg total) by mouth daily. 90 tablet 4  . topiramate (TOPAMAX) 50 MG tablet Take 2 tablets (100 mg total) by mouth 2 (two) times daily. 120 tablet 5   No current facility-administered medications for this visit.   OBJECTIVE: Young white woman who appears undernourished Filed Vitals:   09/03/14 1258  BP: 115/90  Pulse: 70  Temp: 97.8 F (36.6 C)  Resp: 18  ECOG: 2 Body mass index is 18.36 kg/(m^2). Filed Weights   09/03/14 1258  Weight: 107 lb (48.535 kg)   January 2015 weight was 146 pounds,  May 2015 weight was 136 pounds, August 2015 weight was 116 pounds.  Sclerae unicteric, EOMs intact Oropharynx clear, dentition in good repair No cervical or supraclavicular adenopathy Lungs no rales or rhonchi Heart regular rate and rhythm Abd soft, nontender, positive bowel sounds , no masses palpated MSK no focal spinal tenderness, no upper extremity lymphedema Neuro: nonfocal, well oriented,  appropriate affect Breasts: Status post bilateral mastectomies. There is no evidence of local recurrence. Both axillae are benign.  LAB RESULTS: Lab Results  Component Value Date   WBC 5.0 05/21/2014   NEUTROABS 3.3 05/21/2014   HGB 15.0 05/21/2014   HCT 42.0 05/21/2014   MCV 91.7 05/21/2014   PLT 145 05/21/2014      Chemistry      Component Value Date/Time   NA 140 05/21/2014 1124   NA 138 08/22/2012 1700   K 3.4* 05/21/2014 1124   K 3.7 08/22/2012 1700   CL 105 10/11/2012 1441   CL 103 08/22/2012 1700   CO2 22 05/21/2014 1124   CO2 23 08/22/2012 1700   BUN 9.3 05/21/2014 1124   BUN 11 08/22/2012 1700   CREATININE 0.9 05/21/2014 1124   CREATININE  0.96 08/22/2012 1700      Component Value Date/Time   CALCIUM 9.4 05/21/2014 1124   CALCIUM 9.1 08/22/2012 1700   ALKPHOS 105 05/21/2014 1124   ALKPHOS 87 08/22/2012 1700  AST 19 05/21/2014 1124   AST 33 08/22/2012 1700   ALT 20 05/21/2014 1124   ALT 24 08/22/2012 1700   BILITOT 0.36 05/21/2014 1124   BILITOT 0.2* 08/22/2012 1700         STUDIES: No results found.   ASSESSMENT: 39 y.o.  [place] woman, known to be BRCA1 and 2 negative,   (1) status post bilateral mastectomies May of 2012 for a right-sided T1c N1 (stage II) grade 3 invasive ductal carcinoma,estrogen and progesterone receptor positive, HER-2 amplified, with an MIB-1 of 85%.   (2) status post carboplatin/ docetaxol/ trastuzumab x6, completed mid December 2012   (3) on trastuzumab every 3 weeks, started August of 2012, interrupted between April and August of 2013, completed 04/27/2012. Most recent echo 08/20/2012  (4) status post radiation therapy, completed 07/12/2011  (5) On tamoxifen starting April 2013, held as of 12/27/2012 with the diagnosis of a left upper extremity DVT.  On therapeutic Coumadin until  May 2015  (6) On goserelin given every 3 months, 1st injection on 10/26/11, most recent dose 04/29/2013.   (7) started on anastrozole November 2014, discontinued January 2015 because of side effects  (8) started letrozole August 2015  Other problems:  (1) Chronic Pain/ fibromyalgia  (2) history of seizure disorder   (3) history of severe migraines   (4)  Severe proteincalorie malnutrition   (5) left upper extremity DVT, port associated, documented age 40 12/19/2012; port removed 09/05/2013 and coumadin discontinued at that time   PLAN:  I am delighted that Franchelle has managed to gain 3 pounds, that is of course assuming are reading is correct. I was ready to admit her and place a feeding tube if necessary because I was very concerned about her continuing weight loss. Today we discussed some  recipes she has copied that she might start, going to can doubly or other cafeteria's if she doesn't feel like cooking, and discussed high-calorie foods. Hopefully when she returns to see me next visit she will have gained a few more pounds. She is still certainly very much on the age.  I am not sure why she is having the difficulty concentrating part of it may be "chemo brain" and many of my patients to suffer from rather severe concentration defects purely due to chemotherapy and a salon. She is also on alprazolam. This medicine is helping keep her seizures under control but it can cause a little bit of difficulty concentrating. I think it might be a good idea to get a baseline measurement for this and we will discuss referral to a neuropsychologist at the next visit.  That will be July 6. She will receive her next goserelin treatment on that date. She knows to call for any problems that may develop before then   09/03/2014     Chauncey Cruel, MD

## 2014-09-04 ENCOUNTER — Other Ambulatory Visit: Payer: Self-pay | Admitting: *Deleted

## 2014-09-04 DIAGNOSIS — C50411 Malignant neoplasm of upper-outer quadrant of right female breast: Secondary | ICD-10-CM

## 2014-09-05 ENCOUNTER — Telehealth: Payer: Self-pay | Admitting: Adult Health

## 2014-09-05 ENCOUNTER — Telehealth: Payer: Self-pay | Admitting: Oncology

## 2014-09-05 NOTE — Telephone Encounter (Signed)
I spoke with [name] about the opportunity to come see me in the Survivorship Clinic at the request of Crystal Pel, RN and Dr. Jana [name].  She agreed to this visit.   She is now scheduled to come to [facility] on Tuesday, 09/09/14 at 1:30pm.  I have contacted our social workers to help by being present for this visit, given [name]'s history of a chronic eating disorder.    I look forward to participating in her care.   Mike Craze, NP [place] [phone]

## 2014-09-05 NOTE — Telephone Encounter (Signed)
Erroneous encounter

## 2014-09-05 NOTE — Telephone Encounter (Signed)
pof noted for Crystal [name] and i have sent her an Photographer

## 2014-09-08 ENCOUNTER — Encounter: Payer: Self-pay | Admitting: Adult Health

## 2014-09-08 NOTE — Progress Notes (Signed)
I received an in-basket message from [employer] that the patient had called her and needed to cancel her survivorship appt with me tomorrow because she couldn't make it.  She has lost her cell phone and does not have a good number to be reached at the moment.  Per Amber's message, the patient will contact me when she is ready to reschedule.  I have cancelled her appointment for tomorrow based on this information.   Mike Craze, NP [place] [phone]

## 2014-09-09 ENCOUNTER — Ambulatory Visit: Payer: Self-pay | Admitting: Adult Health

## 2014-09-09 ENCOUNTER — Telehealth: Payer: Self-pay | Admitting: Adult Health

## 2014-09-09 NOTE — Telephone Encounter (Signed)
I spoke with [name] regarding rescheduling her Survivorship Clinic appointment.  I briefly explained to her the purpose and goals of survivorship.  She hopes that I will be able to help her with "chemo brain."  I let her know that we would further discuss her symptoms and we could make appropriate referrals to specialists (i.e. Neuropsychiatrist) if appropriate.  She agreed with this plan.   She is now rescheduled to see me on Tuesday, 09/16/14 at 11am.    The new phone number where she can be reached, as of today, is: [phone].   I look forward to participating in her care.   Mike Craze, NP [place] [phone]

## 2014-09-11 ENCOUNTER — Encounter: Payer: Self-pay | Admitting: Dietician

## 2014-09-11 ENCOUNTER — Encounter: Payer: Medicare Other | Attending: Oncology | Admitting: Dietician

## 2014-09-11 VITALS — Ht 64.0 in | Wt 107.2 lb

## 2014-09-11 DIAGNOSIS — E46 Unspecified protein-calorie malnutrition: Secondary | ICD-10-CM | POA: Insufficient documentation

## 2014-09-11 DIAGNOSIS — Z713 Dietary counseling and surveillance: Secondary | ICD-10-CM | POA: Diagnosis not present

## 2014-09-11 NOTE — Patient Instructions (Signed)
Aim to eat 3 meals (think about setting reminders in your phone). Start eating something in the morning - 2% cottage cheese or hummus with pretzels. Eat protein about the size of the palm of your hand (3-4 oz) with carbohydrate and some vegetables. Have snacks with protein and carbohydrates if you are hungry (see list). Look into talking to a psychiatrist/psychologist to talk about fears (including fear of gaining weight). Take a multivitamin and think about adding a calcium supplement.

## 2014-09-11 NOTE — Progress Notes (Signed)
Medical Nutrition Therapy:  Appt start time: 9417 end time:  1630.   Assessment:  Primary concerns today: Crystal [name] is here today since she has a hx of breast cancer and recently and lost a lot of weight. Weighed about 145 lbs one year ago and now weighs 107 lbs. She had chemo several years ago so it is not related to this weight loss. Stopped eating a lot of meat but does is fish. Decided to stop eating meat due the treatment of animals. Does still eat cheese and fish. Feels like she might not be getting enough calories. Lowest weight was 104 lbs. Doctor would like her to weigh 120 lbs and she is not interested in gaining weight. Likes the weight she is at. Used to weigh herself more and now weighs every other week. Doctor is concerned that she lost too much weight and was considering putting her in the hospital if she does not gain weight. PCP would also like her to gain weight. Is worried that if she gains even a little bit of weight she will not stop gaining.   Has not used laxatives or vomiting for weight loss. Not currently exercising due to other health concerns. Has social phobias and does not drive much because of this. Would like to see a psychiatrist or psychologist but is working on getting that covered by insurance.   Weight has gone up and down throughout her adult life. States she has not purposely tried to lose weight in her life.   Is on disability. Feels like her mind does not work as well as it used to.   Lives with her husband. Husband is concerned that she is not getting in enough calories. Skips 7 meals per week, usually breakfast. Eating a lot less than she did a year ago. Appetite was stronger a year ago (used to more fast food). Will skip more meals if she is stressed.   Preferred Learning Style:   No preference indicated   Learning Readiness:   Ready  MEDICATIONS: see list   DIETARY INTAKE:  Usual eating pattern includes 2 meals and 0-2 snacks per day.  Avoided  foods include meat, eggs, milk, butter, peanut butter, fruit   24-hr recall:  B ( AM): coffee with coconut milk  Snk ( AM):none L ( PM): tuna melt with 2 pieces of bread or bean dip (beans, sour cream and chips) or pizza with veggies  Snk ( PM): cheetos or hummus or bean dip or nothing D ( PM):shrimp kabobs with mushrooms, onions, peppers or macaroni and cheese with tuna and salsa Snk ( PM): none Beverages: water, coffee  Usual physical activity: none  Estimated energy needs: 1800 calories 200 g carbohydrates 135 g protein 50 g fat  Progress Towards Goal(s):  In progress.   Nutritional Diagnosis:  [place]-3.1 Underweight As related to meal skipping, reduced portion sizes, and fear of gaining weight.  As evidenced by BMI of 18.4.    Intervention:  Nutrition counseling provided. Discussed the importance of eating regular meals and fueling her body for energy. Encouraged her to seek out psychological support for eating issues. Recommended that she follow up with [name], RD to discuss healthy body image and establishing a healthier relationship with food.   Plan: Aim to eat 3 meals (think about setting reminders in your phone). Start eating something in the morning - 2% cottage cheese or hummus with pretzels. Eat protein about the size of the palm of your hand (3-4 oz) with  carbohydrate and some vegetables. Have snacks with protein and carbohydrates if you are hungry (see list). Look into talking to a psychiatrist/psychologist to talk about fears (including fear of gaining weight). Take a multivitamin and think about adding a calcium supplement.  Teaching Method Utilized:  Visual Auditory Hands on  Handouts given during visit include:  MyPlate Handout  Vegetarian Protein  15 g CHO Snacks  Barriers to learning/adherence to lifestyle change: anxiety, chronic health conditions  Demonstrated degree of understanding via:  Teach Back   Monitoring/Evaluation:  Dietary intake,  exercise, and body weight in 1 week(s).

## 2014-09-15 ENCOUNTER — Ambulatory Visit: Payer: Self-pay | Admitting: *Deleted

## 2014-09-16 ENCOUNTER — Ambulatory Visit (HOSPITAL_BASED_OUTPATIENT_CLINIC_OR_DEPARTMENT_OTHER): Payer: Self-pay | Admitting: Adult Health

## 2014-09-16 ENCOUNTER — Encounter: Payer: Self-pay | Admitting: Adult Health

## 2014-09-16 VITALS — BP 119/72 | HR 72 | Temp 98.2°F | Resp 14 | Wt 108.5 lb

## 2014-09-16 DIAGNOSIS — Z79810 Long term (current) use of selective estrogen receptor modulators (SERMs): Secondary | ICD-10-CM

## 2014-09-16 DIAGNOSIS — C50411 Malignant neoplasm of upper-outer quadrant of right female breast: Secondary | ICD-10-CM

## 2014-09-16 DIAGNOSIS — Z170 Estrogen receptor positive status [ER+]: Secondary | ICD-10-CM

## 2014-09-18 NOTE — Progress Notes (Signed)
CLINIC:  Cancer Survivorship   REASON FOR VISIT:  Routine follow-up post-treatment for a recent history of breast cancer.  BRIEF ONCOLOGIC HISTORY:    Breast cancer of upper-outer quadrant of right female breast   10/14/2010 Initial Diagnosis Breast cancer of upper-outer quadrant of right female breast   10/14/2010 Initial Biopsy Right breast needle core biopsy x 2: Both sites reveal IDC, grade 3.  Both biopsy sites prognostic profiles: ER+ (64%), PR+ (18% & 6%), HER2+ (ratio: 5.24 & 4.42). Ki67 65% & 67%.    10/27/2010 Imaging Staging CT chest: Irregular soft tissue nodule in lateral right breast measuring 1.6 cm may correspond with pt's known breast cancer. No evidence of metastatic disease. Probable subpleural lymph nodes in right hemithorax.    10/27/2010 Imaging Staging bone scan: Negative. No evidence osseous metastatic disease.   11/16/2010 Surgery Bilateral simple mastectomies w/ right ax SLNB & regional ax dissection. (R) breast: Grade 3, IDC, spans 1.9 cm, (+)LVI, grade 3 DCIS w/ comedonecrosis. 1 right ax SLN (+).  1/15 axillary dissection LN (+). Extracap extension present. (L) breast benign.   11/16/2010 Pathology Results Right breast prognostic profile (surgical specimen): ER+ (81%), PR+ (86%), HER2+ by CISH (ratio: 5.36). Ki67 85%.    11/16/2010 Pathologic Stage pT1c, pN1a: Stage IIA   12/08/2010 Imaging MRI Brain for syncope, nausea: No acute or metastatic intracranial abnormality. Stable, negative MRI appearance of brain.    12/20/2010 - 04/11/2011 Adjuvant Chemotherapy TCH x 6 cycles. (6th cycle initially held d/t MRSA wound infection, then cycle given 1 week later on 04/11/11)   03/03/2011 Echocardiogram EF: 60-65%.    05/12/2011 - 07/12/2011 Radiation Therapy Adjuvant RT completed Pablo Ledger): Right chest wall: Total dose 50.4 Gy over 28 fractions. Right supraclav: 45 Gy over 25 fractions.  Right chest wall scar boost: 10 Gy over 5 fractions.    06/28/2011 Echocardiogram EF: 50%.    07/14/2011 Imaging MRI Head for recent seizure: No acute intracranial findings. No evidence for metastatic breast cancer. No specific intracranial abnormalities demonstrated which may predispose to seizures. Mild white matter changes nonspecific.   08/2011 - 12/27/2012 Anti-estrogen oral therapy Tamoxifen daily.  Stopped in 11/2012 due to left upper extremity DVT.     - 04/27/2012 Adjuvant Chemotherapy Maintenance 1-year Herceptin, interrupted between April and August of 2013, then completed on 04/27/12   07/12/2012 Imaging CT abd/pelvis for back & abdominal pain: 2.9 cm left ovarian cyst. Otherwise, unremarkable study.    08/20/2012 Echocardiogram EF: 55-60%.    01/08/2013 Imaging Restaging bone scan: Negative. No evidence of metastatic bone disease.    01/15/2013 Imaging MRI Brain for speech/gait disturbance, numbness & weakness: No evidence of metastatic disease.    03/2013 - 05/2013 Anti-estrogen oral therapy Anastrazole daily. Stopped due to side effects.    11/2013 -  Anti-estrogen oral therapy Letrozole daily.    09/16/2014 Survivorship Survivorship Care Plan given to patient and reviewed with her in-person.     INTERVAL HISTORY:  Ms. Bajaj presents to the [facility] today for our initial meeting to review her survivorship care plan detailing her treatment course for breast cancer, as well as monitoring long-term side effects of that treatment, education regarding health maintenance, screening, and overall wellness and health promotion.     Ms. Mccart reports having several complaints since completing her active treatment for Stage IIA invasive ductal carcinoma in 2012, and then completed maintenance Herceptin for 1 year which completed on 04/27/2012.   She has multiple comorbidities, including a history of seizures,  migraine headaches, depression, and anxiety.  She has recently had a significant weight loss and was diagnosed with anorexia nervosa by her PCP in 08/2014.  The only meat she eats is  fish, as this is her personal dietary preferences.  She recently saw a Registered Dietician who has made recommendations to increase her protein, healthy fats, and caloric intake while respecting her wishes not to consume meat.  She weighs 108.5 lbs today.  She reports dealing with several fears/phobias in recent years.  She has been "forcing myself" to get out of the house and go places.  She fears driving, as she is concerned that she may have a seizure while driving and hurt someone else, and she states, "I know this isn't likely because I haven't had a seizure in a really long time, but I still worry about it."  She reports that she does not have a support system of friends outside of her home (which includes her husband and 82 year old son).  This makes her feel lonely and sad.  One of the most troubling symptoms for her is her memory and being forgetful.  She will try to complete everyday tasks and forget she started something and never return to complete the task (ex: "I would put the clothes and detergent in the washer, walk away, and forget to ever start the washing machine." "I can't remember people's names who are close to me and this is so frustrating.")  She has a difficult time quantifying how long this has been going on and/or when it became more severe.  She tells me that she has really noticed that it has gotten "really bad" with regards to her memory over the past 2 years or so.  She is concerned that she may have "chemo brain" and wants to learn more about having this evaluated.  She is also experiencing some periodic hot flashes, which are not severe by her report and occur about 4-5 times per day, on average.  She states that they are much improved since she was started on Topamax for migraine headaches by a provider in the community.  She reports fatigue and "grogginess" during the day, despite sleeping 8+ hours per night.  Lastly, she is concerned about her lack of libido as a 39 year old  woman and how this is affecting her relationship with her husband.  She has "zero desire" for intercourse and wants to know if this is normal and if it will improve.    REVIEW OF SYSTEMS:  General: Denies fever, chills. Cardiac: Denies palpitations, chest pain, and lower extremity edema.  Respiratory: Denies cough, shortness of breath, and dyspnea on exertion. Currently smokes cigarettes (about 1 pack per day).  GI: Denies abdominal pain, constipation, diarrhea, nausea, or vomiting.  GU: Denies dysuria, hematuria, vaginal bleeding, vaginal discharge.  Receives Zoladex injections every 3 months.  Musculoskeletal: Denies joint or bone pain.  Neuro: Denies headache or recent falls. Denies peripheral neuropathy. Skin: Denies rash, pruritis, or open wounds.  Breast: Denies any new nodularity, masses, tenderness in chest wall. Psych: As above.  A 14-point review of systems was completed and was negative, except as noted above.   ONCOLOGY TREATMENT TEAM:  1. Surgeon:  Dr. Marlou Starks at [facility] 2. Medical Oncologist: Dr. Jana Hakim 3. Radiation Oncologist: Dr. Pablo Ledger    PAST MEDICAL/SURGICAL HISTORY:  Past Medical History  Diagnosis Date  . Status post chemotherapy     docetaxel/carboplatin/trastuzumab  . Anxiety   . Hx MRSA infection 03/28/11  Skin  . History of breast cancer DX JUNE 2012 W/ RIGHT BREAST INVASIVE DUCTAL CA  STAGE IIB---  S/P BILATERAL MASTECTOMIES AND RIGHT NODE DISSECTION    ONCOLOGIST-- DR Jana Hakim--  CHEMO ENDED [place] 2012 / RADIATION ENDED MAR 2013--  CURRENT ON TAMOXIFEN AND ZOLADEX  . Family history of anesthesia complication     MOTHER PONV  . Hot flashes due to tamoxifen   . History of idiopathic seizure     DX 2008 --  LAST ONE 2012--  NO ISSUES SINCE AND NO MEDS. -- PT STATES DOCTORS FELT IT WAS STRESS RELATED DUE TO HEALTH ISSUES  . Bipolar affective disorder, mixed   . GERD (gastroesophageal reflux disease)     OCCASIONALLY TAKE ZANTAC  .  Left ovarian cyst   . Right lower quadrant abdominal pain   . Frequency of urination   . Urgency of urination   . Strains to urinate   . Nocturia   . Heart palpitations   . Cancer   . Depression   . Seizures    Past Surgical History  Procedure Laterality Date  . Wisdom tooth extraction    . Biopsy breast  10/14/2010    right needle core biopsy  . Portacath placement  11/16/2010    placement of left subclavian port  . I&d extremity  09/26/2011    Procedure: IRRIGATION AND DEBRIDEMENT EXTREMITY;  Surgeon: Tennis Must, MD;  Location: [facility];  Service: Orthopedics;  Laterality: Right;  I&D right hand cat bite wound  . Breast surgery  11/16/2010    bilateral mastectomy+ right axillary node dissection,T1cN1a, Her2+,ERPR+  (right breast invasive ductal carcinoma)  . Transthoracic echocardiogram  05-28-2012    LVF NORMAL/  EF 55-60%  . Cysto with hydrodistension N/A 07/23/2012    Procedure: CYSTOSCOPY/HYDRODISTENSION instillation of marcaine and pyridium;  Surgeon: Reece Packer, MD;  Location: [facility];  Service: Urology;  Laterality: N/A;  INSTILLATION       ALLERGIES:  Allergies  Allergen Reactions  . Tramadol Other (See Comments)    Seizures  . Phenytoin Nausea And Vomiting  . Thorazine [Chlorpromazine Hcl] Hives  . Ciprofloxacin Nausea And Vomiting  . Vancomycin Rash    Possible rash reported by MD     CURRENT MEDICATIONS:  Current Outpatient Prescriptions on File Prior to Visit  Medication Sig Dispense Refill  . ALPRAZolam (XANAX) 1 MG tablet TAKE ONE TABLET BY MOUTH THREE TIMES DAILY AS NEEDED FOR ANXIETY 90 tablet 0  . citalopram (CELEXA) 40 MG tablet Take 1 tablet (40 mg total) by mouth daily. 90 tablet 3  . esomeprazole (NEXIUM) 20 MG capsule Take 20 mg by mouth daily at 12 noon.    . fluticasone (FLONASE) 50 MCG/ACT nasal spray Place 2 sprays into both nostrils daily. 16 g 6  . goserelin (ZOLADEX) 10.8 MG injection Inject 10.8 mg into the skin  every 3 (three) months.    Marland Kitchen letrozole (FEMARA) 2.5 MG tablet Take 1 tablet (2.5 mg total) by mouth daily. 90 tablet 4  . topiramate (TOPAMAX) 50 MG tablet Take 2 tablets (100 mg total) by mouth 2 (two) times daily. 120 tablet 5   No current facility-administered medications on file prior to visit.     ONCOLOGIC FAMILY HISTORY:  Family History  Problem Relation Age of Onset  . Cancer Maternal Grandfather   . Mental illness Brother   . Mental illness Son      SOCIAL HISTORY:  DEZRA MANDELLA is  married and lives with her husband and son in [place], [place].  Her son is 62 years old and has 2 jobs; one at [employer] and one at [employer].  Her husband works in Architect.  Ms. Kvamme has recently been approved for disability and thus does not work.  She has a history of drinking alcohol heavily, but reports that she has quit.  She smokes about 1 pack of cigarettes per day and has for about 20 years.     PHYSICAL EXAMINATION:  Vital Signs:   Filed Vitals:   09/16/14 1055  BP: 119/72  Pulse: 72  Temp: 98.2 F (36.8 C)  Resp: 14   Weight History Date Weight (lbs) Comments  03/21/13 140   05/20/13 144   09/02/13 136   12/03/13 120 Documented visit for weight loss with Dr. Jana Hakim  12/30/13 116   02/26/14 112   03/26/14 110   06/09/14 106   06/18/14 104   08/04/14 101 Diagnosed with anorexia nervosa by Dr. Lorelei Pont  08/19/14 104   09/03/14 107   09/11/14 107 Visit with Registered Dietician  09/16/14 108.5 Survivorship Clinic visit    General: Pale, frail female in no acute distress.  She is unaccompanied today.  HEENT: Head is atraumatic and normocephalic.  Pupils equal and reactive to light and accomodation. Conjunctivae clear without exudate.  Sclerae anicteric. Oral mucosa is pale, moist, and intact without lesions.  Oropharynx is pink without lesions or erythema.  Lymph: No cervical, supraclavicular, infraclavicular, or axillary lymphadenopathy noted on palpation.    Cardiovascular: Regular rate and rhythm without murmurs, rubs, or gallops. Respiratory: Clear to auscultation bilaterally. Chest expansion symmetric without accessory muscle use on inspiration or expiration.  GI: Abdomen soft and round. No tenderness to palpation. Bowel sounds normoactive in 4 quadrants. No hepatosplenomegaly.   GU: Deferred.  Musculoskeletal: Muscle strength 5/5 in all extremities.  Full ROM noted in all extremities.  Neuro: No focal deficits. Steady gait.  Psych: Mood and affect normal and appropriate for situation.  Extremities: No edema, cyanosis, or clubbing.  Skin: Warm and dry. No open lesions noted.   LABORATORY DATA:  None for this visit.  DIAGNOSTIC IMAGING:  None for this visit.     ASSESSMENT AND PLAN:   1. History of breast cancer:  Ms. Flud will follow-up with her medical oncologist, Dr. Jana Hakim in 10/2014 with history and physical exam per surveillance protocol.  She will continue her anti-estrogen therapy with letrozole & Zoladex injections as prescribed by Dr. Jana Hakim.  Though the incidence is low, there is an associated risk of endometrial cancer with anti-estrogen therapies like letrozole.  Ms. Opfer was encouraged to contact Dr. Jana Hakim or myself with any vaginal bleeding while taking the letrozole & Zoladex. Other side effects of letrozole were again reviewed with her as well. A comprehensive survivorship care plan and treatment summary was reviewed with the patient today detailing her breast cancer diagnosis, treatment course, potential late/long-term effects of treatment, appropriate follow-up care with recommendations for the future, and patient education resources.  A copy of this summary, along with a letter will be sent to the patient's primary care provider via mail/fax/In Basket message after today's visit.  Ms. Suitt is welcome to return to the Survivorship Clinic in the future, as needed; no follow-up will be scheduled at this time.    2.  Cognitive changes: The etiology for Ms. Kilty's cognitive changes is likely multifactorial.  Given her significant weight loss in the past 1.5 years and her recent  diagnosis of anorexia nervosa, it is likely the main culprit of her difficulty with memory and concentration is related to malnutrition.  We discussed how our bodies use the glucose in food to help our brain function at its best.  While "chemo brain" is certainly one of the differential diagnoses for her cognitive changes, given her history of receiving anthracycline chemotherapy, "chemo brain" is often a diagnosis of exclusion.  Several of her medications could be contributing to her lack of mental acuity as well, particularly the Xanax. However, until we have her nutrition improved, it will be difficult to assess if other factors are contributing to her cognitive changes.  She has met with a registered dietician and has plans to have continued follow-up with her next week.  I also encouraged Ms. Arseneau to engage in some form of physical exercise; walking outside would be excellent for her.  We discussed the role of endorphins and exercise in improving mood and mental acuity. She agrees with this plan.  We also discussed referring her to a neuropsychology specialist for formal testing and evaluation for chemo brain.  However, given her current uninsured status and a large out-of-pocket expense for this evaluation, she would like to pursue more cost effective interventions, like diet improvement and exercise, at this time. She knows that if her insurance or financial situation changes, I would be more than happy to place this referral for her at that time.   3. Altered body image & self-esteem related to breast cancer diagnosis and treatment: We discussed Ms. Sasso's concerns regarding her body image and self-esteem.  We discussed several options to help improve her overall outlook on herself, her body, and her self-worth.  I think seeing a professional  counselor would be excellent to help her with this.  She also will be discussing her body image concerns with the dietician at her upcoming appointment as well.  She endorses that she has lost a lot of confidence as a result of her cancer diagnosis and subsequent treatment and really wants to have a "makeover" to help her feel better about her herself.  Therefore, I have placed a referral for her to attend the "Look Good.Marland KitchenMarland KitchenFeel Better" class here at the cancer center, which is facilitated by a survivor and offers tips on makeup application and hairstyling.  While the course is traditionally designed for patients undergoing cancer treatments, I think the fellowship of being with other female patients and learning make-up/hair techniques may help Ms. Pyper, based on her wishes.    4. Fatigue: Fatigue in Ms. Rueckert's case is likely multifactorial, with medications and poor nutrition being the likely etiologies.  She takes several potentially sedating medications for her neurological disorders (migraines and seizures), which could be contributing to her fatigue.  It sounds like sleep disturbance is not the cause, as she is consistently having 8 hours of uninterrupted sleep per night.  The "grogginess" again could be related to medications or lack of key nutrients in her diet.  We discussed the critical role that physical activity plays in managing fatigue.  We discussed the opportunity for her to participate in the free [employer] fitness program, designed for cancer survivors.  She tells me that she does not like gyms and does not want to pursue this program.  Therefore, I encouraged her to start a walking program and being intentional about having some time reserved for taking a walk outside for up to 30+ minutes/day.  She agrees with this plan.   5.  Adjustment disorder with depressed and anxious mood: Despite Ms. Campoy having completed treatment over 2 years ago, she is still suffering with several issues  regarding the trauma of a cancer diagnosis and treatment for cancer.  She is currently taking Celexa for depression and Xanax for both her seizure disorder and for anxiety.  She is adamant that she does not want any additional medications for these conditions.  Abigail, LCSW and I discussed at length with her the importance of pursuing professional counseling to help with her various fears, relationship concerns, and body image/self-esteem. She has access to the [facility] program at [facility], which allows her to see a counseling intern for free or on a sliding fee scale.  Ms. Lieser expresses interest in pursuing professional counseling and has a counselor in mind that she would like to see, but is concerned because she does not have insurance.  We encouraged her to find the name of that counselor she is interested in seeing and either myself or Abigail could contact their office on Ms. Brackin's behalf to ask them about their fees for service for patients without insurance.  We encouraged Ms. Nuckles not to wait to pursue counseling; either with the counselor she would like to see or at [facility] with the interns.  We stressed the importance of finding someone she trusts who can help her work through her many concerns for long-term counseling since she is open and ready to receive that service. She agrees with this plan and knows to reach out to [facility] or myself if she needs help getting in to be seen wherever she chooses to go. She is also going to participate in the next session of [facility] ("Finding Your New Normal") here at the cancer center in September.  This 8-week support group series for survivors helps patients explore their new lives after cancer and adapt to the many ways their lives have changed as a result.  I have referred her to our Patient and [facility] and they will contact her with additional details closer to the start date of [facility] in September.   6. Decreased libido/Sexual health & intimacy: Ms.  Melucci is struggling with decreased libido that is likely due to her anti-estrogen medications, both the letrozole and the Zoladex injections.  We discussed the role that estrogen plays in a woman's sexual health, and also the role it plays in breast cancer.  She understands these medications are necessary to help prevent a cancer recurrence, but is having a hard time as a 39 year old female having decreased libido and how it affects her intimate relationship.  We discussed that SSRIs, like Celexa, can also contribute to sexual dysfunction.  I encouraged her to attend the free "Healthy Pelvic Floor & Intimacy" class, facilitated by our Pelvic Floor Rehab Therapist.  This class can help her better understand interventions to aid in sexual dysfunction that is so common for breast cancer survivors. She also plans to bring her husband to the [facility] course in September that will include a breakaway session for significant others of survivors to explore topics such as sexuality and intimacy.   7. Smoking cessation: Ms. Harriss currently smokes about 1 pack per day of cigarettes and expresses little desire to quit at this time.  We discussed the importance of smoking cessation to improve her overall health and wellness.  I gave her 2 different booklets from the [facility] with tips on how to stop smoking.  I am happy to refer her to  formal smoking cessation counseling when she is ready.   8. Bone health:  Given Ms. Buechner's history of breast cancer and her current treatment regimen including anti-estrogen therapy with letrozole and Zoladex, she is at risk for bone demineralization.  She will be eligible for a DEXA scan when she is 39 years old, or before if clinically indicated.  In the meantime, she was encouraged to increase her consumption of foods rich in calcium, as well as increase her weight-bearing activities.  She was given education on specific activities to promote bone health.  9. Cancer screening:   Due to Ms. Fidel's history and her age, she should receive screening for skin cancers, colon cancer, and gynecologic cancers.  The information and recommendations are listed on the patient's comprehensive care plan/treatment summary and were reviewed in detail with the patient.    10. Health maintenance and wellness promotion: Ms. Kapaun was encouraged to consume 5-7 servings of fruits and vegetables per day. She was also encouraged to engage in moderate to vigorous exercise for 30 minutes per day most days of the week. She was instructed to limit her alcohol consumption and was encouraged stop smoking.     11. Support services/counseling: It is not uncommon for this period of the patient's cancer care trajectory to be one of many emotions and stressors.  Ms. Weightman was encouraged to take advantage of our many support services programs, support groups, and/or counseling in coping with her new life as a cancer survivor after completing anti-cancer treatment.  She was offered support today through active listening and expressive supportive counseling.  She was given information regarding our available services and encouraged to contact me with any questions or for help enrolling in any of our support group/programs.   Note: Johnnye Lana, LCSW was present for much of today's visit and provided emotional support and resources for Ms. Everly as well.   A total of 60 minutes of face-to-face time was spent with this patient with greater than 50% of that time in counseling and care-coordination.   Mike Craze, NP Survivorship Program [place] [phone]   Note: PRIMARY CARE PROVIDER [place], [place] [phone]

## 2014-09-22 ENCOUNTER — Other Ambulatory Visit: Payer: Self-pay | Admitting: Family Medicine

## 2014-09-23 ENCOUNTER — Encounter: Payer: Medicare Other | Admitting: *Deleted

## 2014-09-23 DIAGNOSIS — E46 Unspecified protein-calorie malnutrition: Secondary | ICD-10-CM | POA: Diagnosis not present

## 2014-09-23 NOTE — Progress Notes (Signed)
Appointment start time: 1500  Appointment end time: 1600  Patient was seen on 09/23/14 for nutrition counseling pertaining to disordered eating  Primary care provider: Dr. Lorelei Pont Therapist: none currently Any other medical team members: none currently.  Would like to see Dr. Toy Care when she has insurance   Assessment  Crystal [name] was referred by Crystal [name], RD.  Crystal [name] met with Crystal [name] a couple weeks ago upon referral from her provider for malnutrition.  At that visit Crystal [name] suspected disordered eating/disotarted body image and referred [name] to this provider who has more experience in that area.  [name] states she was not intentionally losing weight.  She was heavier before and people mistookher for being pregnant.  She made a lot dietary changes: No meat or eggs, does eat fish, stopped drinking sodas and Lost a lot of weight.  She reports that her doctors are concerned of eating disorder, but she just thinks she has adopted healthier eating habits.  Denies an eating disorder, but she does have a fear of gaining weight because she used to be heavy and that was not a good experience.  she likes her current size and is absolutely terrified of gaining weight.  Her medical record indicates chronic anorexia nervosa, but Crystal [name] denies an eating disorder.  She has history of anxiety and depression.  She has not been diagnosed with bipolar, but states she has a history of bipolar episodes, although not in awhile.  Currently she questions if she's entering a bipolar cycle because she feels very  Happy, "too happy" and isn't sure if that is because she's eating a little more, or because she's starting to have an episode.   She has a history of breast cancer 4 years ago  Growth Metrics: Ideal BMI for age: 33.6 BMI today: 18.61 % Ideal today:  90% Goal rate of weight gain:  0.5 lb/week  Eating history: Length of time: 1 year Previous treatments: none Goals for RD meetings: not sure, she doesn't want to gain,  but she wants to make sure she's healthy  Weight history:  Highest weight: ~155 lb   Lowest weight: 104 lb Most consistent weight: 135-140 lb  What would you like to weigh:108 lb How has weight changed in the past year: ~40 pound weight loss  Medical Information:  Changes in hair, skin, nails since ED started: hair falls out some.  Skin is oilier right now Chewing/swallowing difficulties: none Relux or heartburn: always Trouble with teeth: haven't seen a dentist, but feels like her front teeth might be rotten and might fall out.  She is a ppd smoker LMP without the use of hormones: NA, doesn't have periods anymore since chemo in 2012 Constipation, diarrhea:  Rarely constipation, no diarrhea.  Daily BM- this is improved since adopting  Healthier eating positive for cold intolerance Some dizziness, but feels like that has been going on since a kid.  History seizures and fainting.  Last seizure  2  Years ago Difficulty concentrating/focuing. Positive for memory loss Poor energy Positive for mood changes and irritability medical record indicates poor libido Positive for premature satiety Doesn't feel hungry as much   Mental health diagnosis: none, might be OSFED   Dietary assessment: A typical day consists of 2 meals and 1-2 snacks  Safe foods include: cheese (doesnt' like yogurt), fish Avoided foods include:meats, eggs, cow's milk (prefers coconut milk),   24 hour recall:  (nothing to eat today) 2 crackers with cottage cheese  Jordan with vegan mayo and veggies on  2 slices whole wheat bread with provolone cheese  Beverages: coffee with coconut milk (a lot of coffee!!), and water (3-4 bottles)   What Methods Do You Use To Control Your Weight (Compensatory behaviors)?           Restricting (calories, fat, carbs): not purposefully   SIV: denies  Diet pills: denies  Laxatives: denies  Diuretics: denies  Alcohol or drugs: denies (does smoke)  Exercise (what type): denies  Food  rules or rituals (explain): denies  Binge: denies   Administered EAT-26 Score significant >20 Patient score: 26   Estimated energy intake: 600 kcal  Estimated energy needs: 2200 kcal for weight gain 275 g CHO 110 g pro 73 g fat  Nutrition Diagnosis: NI-1.4 Inadequate energy intake As related to meal skipping and limited calorie intake.  As evidenced by dietary recall.  Intervention/Goals: Discussed what happens when a person doesn't eat: metabolic changes, bone, GI, energy, hair, mood, etc.  All those body functions suffer with inadequate energy intake.  Discussed what is an eating disorder and suggest she consider that she might have one.  Suggested therapy.  Crystal [name] is interested, but wants to wait until she has insurance.  She is terrified of gaining weight.  Challenged black and white thinking and encouraged her to focus on improving how she feels and how her brain and body function, not focusing on needing to gain weight.    Meal plan:    4 eating occasions (small meals) with at least 2 food groups each time  Monitoring and Evaluation: Patient will follow up in 2 weeks.

## 2014-10-07 ENCOUNTER — Encounter: Payer: Medicare Other | Attending: Oncology | Admitting: *Deleted

## 2014-10-07 DIAGNOSIS — Z713 Dietary counseling and surveillance: Secondary | ICD-10-CM | POA: Diagnosis not present

## 2014-10-07 DIAGNOSIS — E46 Unspecified protein-calorie malnutrition: Secondary | ICD-10-CM | POA: Insufficient documentation

## 2014-10-07 DIAGNOSIS — F5000 Anorexia nervosa, unspecified: Secondary | ICD-10-CM

## 2014-10-07 NOTE — Progress Notes (Signed)
Appointment start time: 1500  Appointment end time: 1545  Patient was seen on 10/07/14 for nutrition counseling pertaining to disordered eating  Primary care provider: Dr. Lorelei Pont Therapist: none currently Any other medical team members: none currently.  Would like to see Dr. Toy Care when she has insurance   Assessment :  After her appointment 2 weeks ago, Chad tried to implement nutrition recommendations: For a couple days she ate more snacks and ate lunch (instead of snack).  She had more energy.  But, she felt like she was getting too big, though, and stopped.  She checked her weight and she hasn't gained any, but she feels like she has.  Since her double mastectomy, she very much wants a flat stomach.  She thinks she is going through a bipolar phase.  She is very easily irritable.  She is overwhelmed right now and she doesn't eat well when she is stressed or worried.  She has done some reflection, and believes her eating behavior stems from a desire for control.  She wasn't able to control her cancer or her history or anything else about her life, but she can control her eating.  Her husband is very negative about her current weight and very much wants her to gain.  She thinks she looks fine and is resentful of his concern.    HPI: Crystal [name] was referred by [name], RD.  [name] met with Paislee a couple weeks ago upon referral from her provider for malnutrition.  At that visit [name] suspected disordered eating/disotarted body image and referred Evalee to this provider who has more experience in that area.  Kang states she was not intentionally losing weight.  She was heavier before and people mistookher for being pregnant.  She made a lot dietary changes: No meat or eggs, does eat fish, stopped drinking sodas and Lost a lot of weight.  She reports that her doctors are concerned of eating disorder, but she just thinks she has adopted healthier eating habits.  Denies an eating disorder, but she does  have a fear of gaining weight because she used to be heavy and that was not a good experience.  she likes her current size and is absolutely terrified of gaining weight.  Her medical record indicates chronic anorexia nervosa, but Allani denies an eating disorder.  She has history of anxiety and depression.  She has not been diagnosed with bipolar, but states she has a history of bipolar episodes, although not in awhile.  Currently she questions if she's entering a bipolar cycle because she feels very  Happy, "too happy" and isn't sure if that is because she's eating a little more, or because she's starting to have an episode.   She has a history of breast cancer 4 years ago  Growth Metrics: Ideal BMI for age: 58.6 BMI today: 18.61 % Ideal today:  90% Goal rate of weight gain:  0.5 lb/week  Medical Information:  Changes in hair, skin, nails since ED started: hair falls out some.  Skin is oilier right now Chewing/swallowing difficulties: none Relux or heartburn: always Trouble with teeth: haven't seen a dentist, but feels like her front teeth might be rotten and might fall out.  She is a ppd smoker LMP without the use of hormones: NA, doesn't have periods anymore since chemo in 2012 Constipation, diarrhea:  Rarely constipation, no diarrhea.  Daily BM- this is improved since adopting  Healthier eating positive for cold intolerance Some dizziness, but feels like that has been  going on since a kid.  History seizures and fainting.  Last seizure  2  Years ago Difficulty concentrating/focuing. Positive for memory loss Poor energy Positive for mood changes and irritability medical record indicates poor libido Positive for premature satiety Doesn't feel hungry as much   Mental health diagnosis: none, might be OSFED   Dietary assessment: A typical day consists of 2 meals and 1-2 snacks  Safe foods include: cheese (doesnt' like yogurt), fish Avoided foods include:meats, eggs, cow's milk  (prefers coconut milk),   24 hour recall:  Crackers, cottage cheese, tuna salad Tuna patty on bun Crackers with pimento cheese Beverages: coffee (quite a bit) and water.    Nothing today  Beverages: coffee with coconut milk (a lot of coffee!!), and water (3-4 bottles)   What Methods Do You Use To Control Your Weight (Compensatory behaviors)?  Calorie restriction   Administered EAT-26 on 09/23/14 Score significant >20 Patient score: 26   Estimated energy intake: <600 kcal  Estimated energy needs: 2200 kcal for weight gain 275 g CHO 110 g pro 73 g fat  Nutrition Diagnosis: NI-1.4 Inadequate energy intake As related to meal skipping and limited calorie intake.  As evidenced by dietary recall.  Intervention/Goals: Reviewed what happens when a person doesn't eat: metabolic changes, bone, GI, energy, hair, mood, etc.  All those body functions suffer with inadequate energy intake.  Again discussed possibility of an eating disorder.  Kaleya thinks it's a possibility.  Suggested therapy and she accepted the referral to [facility].  She is terrified of gaining weight.  Challenged black and white thinking and encouraged her to focus on improving how she feels and how her brain and body function, not focusing on needing to gain weight.  She realizes the negative effects of her eating: poor energy, poor mood, poor skin turgor, etc.  Discussed "food is medicine" that her body needs to heal.  It's kind of like her chemo; she has to have it to get better  Goals Try to limit coffee to 3-4 cups instead of 5-6 Try to increase water to 4-5 bottles/day  Try again eating 4 times/day, every 3 hours Eat 2 foods at each eating occasion  B: 1/2 cup cottage cheese with 3 crackers L: tuna on 2 slices bread S: handful of nuts with 3 crackers D: as is  Meal plan:    4 eating occasions (small meals) with at least 2 food groups each time  Monitoring and Evaluation: Patient will follow up in 1  weeks.

## 2014-10-07 NOTE — Patient Instructions (Addendum)
Crystal [name].  [facility] [phone]  You have "brain cancer" and food is your "chemo" Try to limit coffee to 3-4 cups instead of 5-6 Try to increase water to 4-5 bottles/day  Try again eating 4 times/day, every 3 hours Eat 2 foods at each eating occasion  B: 1/2 cup cottage cheese with 3 crackers L: tuna on 2 slices bread S: handful of nuts with 3 crackers D: as is  We are not putting too much energy in your tank, just enough to give you a little more energy.

## 2014-10-14 ENCOUNTER — Ambulatory Visit: Payer: Self-pay | Admitting: *Deleted

## 2014-10-28 ENCOUNTER — Ambulatory Visit: Payer: Self-pay | Admitting: *Deleted

## 2014-11-04 ENCOUNTER — Encounter: Payer: Self-pay | Admitting: Adult Health

## 2014-11-04 NOTE — Progress Notes (Signed)
A birthday card was mailed to the patient today on behalf of the Survivorship Program at [facility].   Analeise Mccleery, NP Survivorship Program [facility] [phone]  

## 2014-11-05 ENCOUNTER — Telehealth: Payer: Self-pay | Admitting: Oncology

## 2014-11-05 ENCOUNTER — Other Ambulatory Visit (HOSPITAL_BASED_OUTPATIENT_CLINIC_OR_DEPARTMENT_OTHER): Payer: Medicare Other

## 2014-11-05 ENCOUNTER — Telehealth: Payer: Self-pay | Admitting: General Practice

## 2014-11-05 ENCOUNTER — Ambulatory Visit (HOSPITAL_BASED_OUTPATIENT_CLINIC_OR_DEPARTMENT_OTHER): Payer: Medicare Other

## 2014-11-05 ENCOUNTER — Ambulatory Visit (HOSPITAL_BASED_OUTPATIENT_CLINIC_OR_DEPARTMENT_OTHER): Payer: Medicare Other | Admitting: Oncology

## 2014-11-05 VITALS — BP 112/81 | HR 61 | Temp 97.7°F | Resp 18 | Ht 64.0 in | Wt 107.4 lb

## 2014-11-05 DIAGNOSIS — C50411 Malignant neoplasm of upper-outer quadrant of right female breast: Secondary | ICD-10-CM

## 2014-11-05 DIAGNOSIS — Z5111 Encounter for antineoplastic chemotherapy: Secondary | ICD-10-CM

## 2014-11-05 DIAGNOSIS — Z86718 Personal history of other venous thrombosis and embolism: Secondary | ICD-10-CM

## 2014-11-05 DIAGNOSIS — I82623 Acute embolism and thrombosis of deep veins of upper extremity, bilateral: Secondary | ICD-10-CM

## 2014-11-05 DIAGNOSIS — C773 Secondary and unspecified malignant neoplasm of axilla and upper limb lymph nodes: Secondary | ICD-10-CM | POA: Diagnosis not present

## 2014-11-05 DIAGNOSIS — E43 Unspecified severe protein-calorie malnutrition: Secondary | ICD-10-CM

## 2014-11-05 DIAGNOSIS — Z79811 Long term (current) use of aromatase inhibitors: Secondary | ICD-10-CM

## 2014-11-05 DIAGNOSIS — I82629 Acute embolism and thrombosis of deep veins of unspecified upper extremity: Secondary | ICD-10-CM

## 2014-11-05 DIAGNOSIS — Z170 Estrogen receptor positive status [ER+]: Secondary | ICD-10-CM

## 2014-11-05 LAB — COMPREHENSIVE METABOLIC PANEL (CC13)
ALT: 24 U/L (ref 0–55)
AST: 22 U/L (ref 5–34)
Albumin: 4.4 g/dL (ref 3.5–5.0)
Alkaline Phosphatase: 97 U/L (ref 40–150)
Anion Gap: 11 mEq/L (ref 3–11)
BILIRUBIN TOTAL: 0.41 mg/dL (ref 0.20–1.20)
BUN: 11.7 mg/dL (ref 7.0–26.0)
CO2: 23 meq/L (ref 22–29)
Calcium: 10.1 mg/dL (ref 8.4–10.4)
Chloride: 110 mEq/L — ABNORMAL HIGH (ref 98–109)
Creatinine: 1 mg/dL (ref 0.6–1.1)
EGFR: 69 mL/min/{1.73_m2} — AB (ref >90)
Glucose: 92 mg/dl (ref 70–140)
Potassium: 4.6 mEq/L (ref 3.5–5.1)
SODIUM: 145 meq/L (ref 136–145)
Total Protein: 7 g/dL (ref 6.4–8.3)

## 2014-11-05 LAB — CBC WITH DIFFERENTIAL/PLATELET
BASO%: 0.8 % (ref 0.0–2.0)
BASOS ABS: 0 10*3/uL (ref 0.0–0.1)
EOS%: 1.7 % (ref 0.0–7.0)
Eosinophils Absolute: 0.1 10*3/uL (ref 0.0–0.5)
HCT: 45.5 % (ref 34.8–46.6)
HEMOGLOBIN: 15.6 g/dL (ref 11.6–15.9)
LYMPH%: 22.5 % (ref 14.0–49.7)
MCH: 33.2 pg (ref 25.1–34.0)
MCHC: 34.3 g/dL (ref 31.5–36.0)
MCV: 96.6 fL (ref 79.5–101.0)
MONO#: 0.2 10*3/uL (ref 0.1–0.9)
MONO%: 4.1 % (ref 0.0–14.0)
NEUT#: 4.2 10*3/uL (ref 1.5–6.5)
NEUT%: 70.9 % (ref 38.4–76.8)
Platelets: 193 10*3/uL (ref 145–400)
RBC: 4.71 10*6/uL (ref 3.70–5.45)
RDW: 13.3 % (ref 11.2–14.5)
WBC: 6 10*3/uL (ref 3.9–10.3)
lymph#: 1.3 10*3/uL (ref 0.9–3.3)

## 2014-11-05 LAB — TSH CHCC: TSH: 1.169 m(IU)/L (ref 0.308–3.960)

## 2014-11-05 MED ORDER — GOSERELIN ACETATE 10.8 MG ~~LOC~~ IMPL
10.8000 mg | DRUG_IMPLANT | SUBCUTANEOUS | Status: DC
  Administered 2014-11-05: 10.8 mg via SUBCUTANEOUS
  Filled 2014-11-05: qty 10.8

## 2014-11-05 NOTE — Progress Notes (Signed)
ID: Crystal [name]   DOB: 02-15-1976  MR#: [id number]  ERD#:[id number]  HUD:JSHFWYO,VZCHYIF, MD GYN: SULorine Bears, MD OTHER:  Thea Silversmith, Leigh Aurora, Rupinder Toy Care   CHIEF COMPLAINT:  Hx of Right Breast Cancer CURRENT THERAPY: Goserelin,  letrozole   BREAST CANCER HISTORY: From the initial intake note:  "The patient is a 39 year old [name] who noted a mass in her right breast and brought it to her primary care physician's attention June of 2012. She was set up for mammography at [facility], where an area of calcifications highly suspicious for carcinoma was biopsied. This was in the upper outer quadrant and it measured 1.7 cm mammographically and 1.8 cm by ultrasonography. The pathology report ([id number]) showed a high-grade invasive ductal carcinoma estrogen receptor positive at 64% progesterone receptor positive at 18%, and HER-2 amplified with a ratio by CISH of 5.24. MIB-1-1 was 65%. A second mass biopsied at the same time was also triple positive.  Given her multiple masses in the right breast, mastectomy was recommended. The patient actually opted for bilateral mastectomies and this was performed together with a right axillary lymph node dissection under Dr. Star Age on 11/16/2010. The final pathology [id number]) showed, on the left, no evidence of cancer. On the right the patient had a high-grade invasive ductal carcinoma measuring 1.9 cm, with negative margins, grade 3, involving 2 of 16 lymph nodes (stage IIB)."  Her subsequent history is as detailed below.  INTERVAL HISTORY:  Crystal [name] returns today for followup of her breast cancer.  The interval history is generally stable. She is still struggling with a weight issue, but at least she is not losing any more weight. She tells me recently she has brought in her diet and is feeling like eating more noodles. She also tells me that she does think she may have a "mild" eating disorder. She will be seeing Dr. Toy Care  Regarding  that and her various phobias, she tells me.  --she is tolerating the letrozole well, although she does have some hot flashes and of course vaginal dryness is a major issue. She complains of changes in her vision, with blurring sometimes. This is very intermittent. Her fingers can feel cold. She can have nausea and rarely vomiting. She's developed some  Scaly lesions that she scratches and then there is a mole underneath. She would like me to look at those today. She's had more headaches recently than usual. She feels very forgetful. She is not exercising regularly. She tells me bowel movements are fine.  REVIEW OF SYSTEMS:  A detailed review of systems today was otherwise stable  PAST MEDICAL HISTORY: Past Medical History  Diagnosis Date  . Status post chemotherapy     docetaxel/carboplatin/trastuzumab  . Anxiety   . Hx MRSA infection 03/28/11    Skin  . History of breast cancer DX JUNE 2012 W/ RIGHT BREAST INVASIVE DUCTAL CA  STAGE IIB---  S/P BILATERAL MASTECTOMIES AND RIGHT NODE DISSECTION    ONCOLOGIST-- DR Jana Hakim--  CHEMO ENDED [place] 2012 / RADIATION ENDED MAR 2013--  CURRENT ON TAMOXIFEN AND ZOLADEX  . Family history of anesthesia complication     MOTHER PONV  . Hot flashes due to tamoxifen   . History of idiopathic seizure     DX 2008 --  LAST ONE 2012--  NO ISSUES SINCE AND NO MEDS. -- PT STATES DOCTORS FELT IT WAS STRESS RELATED DUE TO HEALTH ISSUES  . Bipolar affective disorder, mixed   . GERD (gastroesophageal reflux  disease)     OCCASIONALLY TAKE ZANTAC  . Left ovarian cyst   . Right lower quadrant abdominal pain   . Frequency of urination   . Urgency of urination   . Strains to urinate   . Nocturia   . Heart palpitations   . Cancer   . Depression   . Seizures   1. History of seizures. The patient has been thoroughly evaluated both here by Dr. Gaynell Face and at [facility] for this. Among many studies, she had an MRI of the brain on August 28th. This showed an incidental  solitary small lesion in the left frontoparietal white matter. It was not felt to be suggestive of multiple sclerosis. A noncontrasted CT in August 2010 was negative. The patient has been off all seizure medications now for 2 months. There has been no evidence of seizure recurrence. 2. Complex psychology history (please refer to Dr. Iona Coach note in the E-chart from August 29, 2009) with evidence of anxiety disorder, depression and psychosis. The patient is currently on no medications for this and denies any of these symptoms at present. 3. History of migraines. 4. Remote history of multidrug abuse. 5. Status post wisdom teeth extraction.         History of MRSA skin infection   PAST SURGICAL HISTORY: Past Surgical History  Procedure Laterality Date  . Wisdom tooth extraction    . Biopsy breast  10/14/2010    right needle core biopsy  . Portacath placement  11/16/2010    placement of left subclavian port  . I&d extremity  09/26/2011    Procedure: IRRIGATION AND DEBRIDEMENT EXTREMITY;  Surgeon: Tennis Must, MD;  Location: [facility];  Service: Orthopedics;  Laterality: Right;  I&D right hand cat bite wound  . Breast surgery  11/16/2010    bilateral mastectomy+ right axillary node dissection,T1cN1a, Her2+,ERPR+  (right breast invasive ductal carcinoma)  . Transthoracic echocardiogram  05-28-2012    LVF NORMAL/  EF 55-60%  . Cysto with hydrodistension N/A 07/23/2012    Procedure: CYSTOSCOPY/HYDRODISTENSION instillation of marcaine and pyridium;  Surgeon: Reece Packer, MD;  Location: [place];  Service: Urology;  Laterality: N/A;  INSTILLATION      FAMILY HISTORY Family History  Problem Relation Age of Onset  . Cancer Maternal Grandfather   . Mental illness Brother   . Mental illness Son   The patient's mother is living, in her mid 14s, with no history of breast or ovarian cancer. The patient does not know her biological father. The patient has an older brother with  bipolar disease, and according to the patient, borderline schizophrenia.   GYNECOLOGIC HISTORY: (Updated January 2015) Menarche at age 71. She is G1, P1 with first pregnancy to term at age 86. She was not menopausal, and was still having regular periods at the time of her breast cancer diagnosis. She continues on every three-month goserelin.  SOCIAL HISTORY:  (Updated 05/21/2013) She used to work in a [employer] but had to leave that job because of the seizure problem. She's currently unemployed. Her husband , Crystal [name], works as a Development worker, international aid. Crystal [name] has a son, Crystal [name], who is 93 and recently moved in with his father. [name], according to the patient, has significant nausea problems. The patient attends a local [employer]. 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    ADVANCED DIRECTIVES: Not in place  HEALTH MAINTENANCE:  (Updated 05/21/2013) History  Substance Use Topics  . Smoking status: Current Every Day Smoker -- 1.00 packs/day for  20 years    Types: Cigarettes  . Smokeless tobacco: Never Used  . Alcohol Use: Yes     Colonoscopy: Never  PAP: July 2012  Bone density:  Never  Lipid panel: Not on file  Allergies  Allergen Reactions  . Tramadol Other (See Comments)    Seizures  . Phenytoin Nausea And Vomiting  . Thorazine [Chlorpromazine Hcl] Hives  . Ciprofloxacin Nausea And Vomiting  . Vancomycin Rash    Possible rash reported by MD    Current Outpatient Prescriptions  Medication Sig Dispense Refill  . ALPRAZolam (XANAX) 1 MG tablet TAKE 1 TABLET BY MOUTH THREE TIMES DAILY AS NEEDED FOR ANXIETY 90 tablet 1  . citalopram (CELEXA) 40 MG tablet Take 1 tablet (40 mg total) by mouth daily. 90 tablet 3  . esomeprazole (NEXIUM) 20 MG capsule Take 20 mg by mouth daily at 12 noon.    . fluticasone (FLONASE) 50 MCG/ACT nasal spray Place 2 sprays into both nostrils daily. 16 g 6  . goserelin (ZOLADEX) 10.8 MG injection Inject 10.8 mg into the skin every 3 (three) months.    Marland Kitchen letrozole  (FEMARA) 2.5 MG tablet Take 1 tablet (2.5 mg total) by mouth daily. 90 tablet 4  . topiramate (TOPAMAX) 50 MG tablet Take 2 tablets (100 mg total) by mouth 2 (two) times daily. 120 tablet 5   No current facility-administered medications for this visit.   OBJECTIVE: Young white woman  In no acute distress Filed Vitals:   11/05/14 1154  BP: 112/81  Pulse: 61  Temp: 97.7 F (36.5 C)  Resp: 18  ECOG: 2 Body mass index is 18.43 kg/(m^2). Filed Weights   11/05/14 1154  Weight: 107 lb 6.4 oz (48.716 kg)   January 2015 weight was 146 pounds,  May 2015 weight was 136 pounds, August 2015 weight was 116 pounds.  Sclerae unicteric, EOMs intact Oropharynx clear, dentition in good repair No cervical or supraclavicular adenopathy Lungs no rales or rhonchi Heart regular rate and rhythm Abd soft, nontender, positive bowel sounds MSK no focal spinal tenderness, no upper extremity lymphedema Neuro: nonfocal, well oriented, appropriate affect Breasts:  Status post bilateral mastectomies. There is no evidence of local recurrence. Both axillae are benign  skin: The skin lesion she showed me on her left forearm was minimally raised and light brown. It is benign.  LAB RESULTS: Lab Results  Component Value Date   WBC 6.0 11/05/2014   NEUTROABS 4.2 11/05/2014   HGB 15.6 11/05/2014   HCT 45.5 11/05/2014   MCV 96.6 11/05/2014   PLT 193 11/05/2014      Chemistry      Component Value Date/Time   NA 140 05/21/2014 1124   NA 138 08/22/2012 1700   K 3.4* 05/21/2014 1124   K 3.7 08/22/2012 1700   CL 105 10/11/2012 1441   CL 103 08/22/2012 1700   CO2 22 05/21/2014 1124   CO2 23 08/22/2012 1700   BUN 9.3 05/21/2014 1124   BUN 11 08/22/2012 1700   CREATININE 0.9 05/21/2014 1124   CREATININE 0.96 08/22/2012 1700      Component Value Date/Time   CALCIUM 9.4 05/21/2014 1124   CALCIUM 9.1 08/22/2012 1700   ALKPHOS 105 05/21/2014 1124   ALKPHOS 87 08/22/2012 1700   AST 19 05/21/2014 1124   AST  33 08/22/2012 1700   ALT 20 05/21/2014 1124   ALT 24 08/22/2012 1700   BILITOT 0.36 05/21/2014 1124   BILITOT 0.2* 08/22/2012 1700  STUDIES: No results found.   ASSESSMENT: 39 y.o.  [place] woman, known to be BRCA1 and 2 negative,   (1) status post bilateral mastectomies May of 2012 for a right-sided T1c N1 (stage II) grade 3 invasive ductal carcinoma,estrogen and progesterone receptor positive, HER-2 amplified, with an MIB-1 of 85%.   (2) status post carboplatin/ docetaxol/ trastuzumab x6, completed mid December 2012   (3) on trastuzumab every 3 weeks, started August of 2012, interrupted between April and August of 2013, completed 04/27/2012. Most recent echo 08/20/2012  (4) status post radiation therapy, completed 07/12/2011  (5) On tamoxifen starting April 2013, held as of 12/27/2012 with the diagnosis of a left upper extremity DVT.  On therapeutic Coumadin until  May 2015  (6) On goserelin given every 3 months, 1st injection on 10/26/11  (7) started on anastrozole November 2014, discontinued January 2015 because of side effects  (8) started letrozole August 2015  Other problems:  (1) Chronic Pain/ fibromyalgia  (2) history of seizure disorder   (3) history of severe migraines   (4)  Severe proteincalorie malnutrition   (5) left upper extremity DVT, port associated, documented age 53 12/19/2012; port removed 09/05/2013 and coumadin discontinued at that time   PLAN:   Edda is maintaining her weight. She is working with one of our nutritionists and is planning to see a psychiatrist literally about the weight issue but about some of her phobias. I think this is a favorable development.  From our point of view, there is no evidence of breast cancer recurrence now alone more than 4 years out from her definitive surgery. This is very favorable. The plan is to continue the goserelin and letrozole at least through 2020 and then reassess.   I will see her  again in 12 weeks, when she receives her next goserelin shot. She knows to call for any problems that may develop before then.     11/05/2014     Chauncey Cruel, MD

## 2014-11-05 NOTE — Telephone Encounter (Signed)
Called patient and she is aware of her 01/2015 schedule

## 2014-11-12 ENCOUNTER — Other Ambulatory Visit: Payer: Self-pay

## 2014-11-12 ENCOUNTER — Ambulatory Visit: Payer: Self-pay | Admitting: Oncology

## 2014-11-14 ENCOUNTER — Encounter: Payer: Medicare Other | Attending: Oncology | Admitting: *Deleted

## 2014-11-14 ENCOUNTER — Encounter: Payer: Self-pay | Admitting: *Deleted

## 2014-11-14 VITALS — Wt 108.0 lb

## 2014-11-14 DIAGNOSIS — E46 Unspecified protein-calorie malnutrition: Secondary | ICD-10-CM | POA: Insufficient documentation

## 2014-11-14 DIAGNOSIS — Z713 Dietary counseling and surveillance: Secondary | ICD-10-CM | POA: Diagnosis not present

## 2014-11-14 DIAGNOSIS — F5000 Anorexia nervosa, unspecified: Secondary | ICD-10-CM

## 2014-11-14 NOTE — Patient Instructions (Signed)
Try Boost in the morning as substitute for breakfast Lunches: oodles of noodles with tuna or tuna sandwich Snacks: chips and dip  Cheese and crackers  Chips with cheese dip  Nuts with M&Ms  Cottage cheese  Nuts  cheese Dinner: pasta with shrimp or some other kind of pasta with beans  Tacos with beans Consider drinks with calories like Gatorade, soy milk,

## 2014-11-14 NOTE — Progress Notes (Signed)
Appointment start time: 1100  Appointment end time: 1145  Patient was seen on 11/14/14 for nutrition counseling pertaining to disordered eating  Primary care provider: Dr. Lorelei Pont Therapist: none currently Any other medical team members: none currently.     Assessment :  Crystal [name] states she has been eating much more lately and she's upset she hasn't gained weight.  Growth Metrics: Ideal BMI for age: 37.6 BMI today: 18.61 % Ideal today:  90% Goal rate of weight gain:  0.5 lb/week  Medical Information:  Changes in hair, skin, nails since ED started: hair falls out some.  Skin is oilier right now Chewing/swallowing difficulties: none Relux or heartburn: always Trouble with teeth: haven't seen a dentist, but feels like her front teeth might be rotten and might fall out.  She is a ppd smoker LMP without the use of hormones: NA, doesn't have periods anymore since chemo in 2012 Constipation, diarrhea:  Rarely constipation, no diarrhea.  Daily BM- this is improved since adopting  Healthier eating positive for cold intolerance Some dizziness, but feels like that has been going on since a kid.  History seizures and fainting.  Last seizure  2  Years ago Difficulty concentrating/focuing. Positive for memory loss Poor energy Positive for mood changes and irritability medical record indicates poor libido Positive for premature satiety Doesn't feel hungry as much   Mental health diagnosis: none, might be OSFED   Dietary assessment: A typical day consists of ~2-3 meals and 1-2 snacks  Safe foods include: cheese (doesnt' like yogurt), fish Avoided foods include:meats, eggs, cow's milk (prefers coconut milk),   She's been eating chinese and fish filets and ramen noodles with tuna; homemade tuna sandwiches, mac-n-cheese Eating little meals didn't work, so now she's eating larger portions She is eating at least 3 times/day Drinking coffee and water  What Methods Do You Use To Control Your  Weight (Compensatory behaviors)?  Calorie restriction   Administered EAT-26 on 09/23/14 Score significant >20 Patient score: 26   Estimated energy intake: <1000 kcal  Estimated energy needs: 2200 kcal for weight gain 275 g CHO 110 g pro 73 g fat  Nutrition Diagnosis: NI-1.4 Inadequate energy intake As related to meal skipping and limited calorie intake.  As evidenced by dietary recall.  Intervention/Goals: Reviewed what happens when a person doesn't eat: metabolic changes, bone, GI, energy, hair, mood, libido etc.  All those body functions suffer with inadequate energy intake. Discussed hypermetabolic state and how she needs even more calories right now.  She now has insurance and accepted information for therapist: [facility]  Meal plan:     Try Boost in the morning as substitute for breakfast Lunches: oodles of noodles with tuna or tuna sandwich Snacks: chips and dip  Cheese and crackers  Chips with cheese dip  Nuts with M&Ms  Cottage cheese  Nuts  cheese Dinner: pasta with shrimp or some other kind of pasta with beans  Tacos with beans Consider drinks with calories like Gatorade, soy milk,   Monitoring and Evaluation: Patient will follow up in 2 weeks.

## 2014-11-28 ENCOUNTER — Other Ambulatory Visit: Payer: Self-pay | Admitting: Diagnostic Neuroimaging

## 2014-11-28 ENCOUNTER — Ambulatory Visit: Payer: Medicare Other | Admitting: *Deleted

## 2014-12-09 ENCOUNTER — Encounter: Payer: Medicare Other | Attending: Oncology | Admitting: *Deleted

## 2014-12-09 ENCOUNTER — Encounter: Payer: Self-pay | Admitting: *Deleted

## 2014-12-09 VITALS — Wt 112.9 lb

## 2014-12-09 DIAGNOSIS — Z713 Dietary counseling and surveillance: Secondary | ICD-10-CM | POA: Diagnosis not present

## 2014-12-09 DIAGNOSIS — F5000 Anorexia nervosa, unspecified: Secondary | ICD-10-CM

## 2014-12-09 DIAGNOSIS — E46 Unspecified protein-calorie malnutrition: Secondary | ICD-10-CM | POA: Insufficient documentation

## 2014-12-09 NOTE — Progress Notes (Signed)
Appointment start time: 1200  Appointment end time: 1230  Patient was seen on 12/09/14 for nutrition counseling pertaining to disordered eating  Primary care provider: Dr. Lorelei Pont Therapist: none currently Any other medical team members: none currently.     Assessment :  Lavaeh states she has been eating much more lately (partially due to taking Prednisone) and she has gained ~4 pounds.  She denies any anxiety or discomfort with that weight gain.  Her energy level is still low.  She has not made an appointment with a therapist yet, but knows she needs to.  She thinks she was prescribed 10 mg Prednisone and she has increased the does herself to three times/day instead of 2.  She feels that works best for her  Regulatory affairs officer: Ideal BMI for age: 2.6 BMI today: 19.37 % Ideal today:  94% Goal rate of weight gain:  0.5 lb/week  Medical Information:  Changes in hair, skin, nails since ED started: hair falls out some.  Skin is oilier right now Chewing/swallowing difficulties: none Relux or heartburn: always Trouble with teeth: haven't seen a dentist, but feels like her front teeth might be rotten and might fall out.  She is a ppd smoker LMP without the use of hormones: NA, doesn't have periods anymore since chemo in 2012 Constipation, diarrhea:  Rarely constipation, no diarrhea.  Daily BM- this is improved since adopting  Healthier eating positive for cold intolerance Some dizziness, but feels like that has been going on since a kid.  History seizures and fainting.  Last seizure  2  Years ago Difficulty concentrating/focuing. Positive for memory loss Poor energy Positive for mood changes and irritability medical record indicates poor libido Positive for premature satiety Doesn't feel hungry as much   Mental health diagnosis: none, might be OSFED   Dietary assessment:  A typical day consists of ~2-3 meals and 1-2 snacks  Safe foods include: cheese (doesnt' like yogurt), fish Avoided  foods include:meats, cow's milk (prefers coconut milk),   Made stuffed shelled pasta with cream cheese and salsa and cheese.  Has been eating tuna and fritos, oodles of noodles, smoothie, eggs.  Has been cooking more and enjoys it.  She's been looking at more recipes 24 hour recall Egg and cheese biscuit Tuna dip with fritos Leftovers pasta Mushroom melt sub  Today smoothie for breakfast so far   Estimated energy intake: 1300 kcal  Estimated energy needs: 2200 kcal for weight gain 275 g CHO 110 g pro 73 g fat  Nutrition Diagnosis: NI-1.4 Inadequate energy intake As related to meal skipping and limited calorie intake.  As evidenced by dietary recall.  Intervention/Goals:  In session Raymie scheduled appointment with therapist at [facility].  Hopefully will also get plugged in with psychiatrist for medication management.   Continue previous:     Try Boost in the morning as substitute for breakfast Lunches: oodles of noodles with tuna or tuna sandwich Snacks: chips and dip  Cheese and crackers  Chips with cheese dip  Nuts with M&Ms  Cottage cheese  Nuts  cheese Dinner: pasta with shrimp or some other kind of pasta with beans  Tacos with beans Consider drinks with calories like Gatorade, soy milk,   Monitoring and Evaluation: Patient will follow up in 4 weeks.

## 2014-12-18 ENCOUNTER — Ambulatory Visit (INDEPENDENT_AMBULATORY_CARE_PROVIDER_SITE_OTHER): Payer: Medicare Other | Admitting: Diagnostic Neuroimaging

## 2014-12-18 ENCOUNTER — Encounter: Payer: Self-pay | Admitting: Diagnostic Neuroimaging

## 2014-12-18 VITALS — BP 132/92 | HR 99 | Ht 64.0 in | Wt 116.4 lb

## 2014-12-18 DIAGNOSIS — G43009 Migraine without aura, not intractable, without status migrainosus: Secondary | ICD-10-CM

## 2014-12-18 MED ORDER — TOPIRAMATE 100 MG PO TABS: 100.0000 mg | ORAL_TABLET | Freq: Two times a day (BID) | ORAL | Status: DC

## 2014-12-18 NOTE — Progress Notes (Signed)
[facility]  PATIENT: Crystal [name] DOB: 12-22-1975  REFERRING CLINICIAN:  HISTORY FROM: patient REASON FOR VISIT: follow up   HISTORICAL  CHIEF COMPLAINT:  Chief Complaint  Patient presents with  . Migraine    rm 7, "headaches much better, few a month that are a little stronger, but much better than past"  . Follow-up    6 mos    HISTORY OF PRESENT ILLNESS:   UPDATE 12/18/14: Since last visit, doing well. Weight and nutrition are improved. HA stable. Occ dull HA, but manageable. Tolerating TPX 179m BID.  UPDATE 06/19/14: Since last visit, HA are much improved. Sleep, mood and hot flashes improved. Patient attributes this to topiramate. Now having 1 day dull HA every 3 weeks. Also patient's significant change her diet over the past year. Now she is eating mainly vegetable-based diet, occasionally fish. She has had unintended 45 pound weight loss as a result of calorie restriction. Also off coumadin now. Also had her port removed.   UPDATE 06/07/13: Since last visit, tried gabapentin, but HA have worsened. Over last few months, daily HA, with photophobia. Using tylenol 2 tabs TID, on daily basis. Has stopped soda, but drinks 1 pot coffee between 8-noon. Poor sleep, more anxiety, more mood disturbances. Does not want to go back to psychiatry. Also with hot flashes, that interrupt sleep. No seizures or non-epileptic spells. Yesterday had morelethargy and confusion, but feels better today. Also, has started coumadin (Oct 2014) for LUE DVT.  PRIOR HPI (12/22/11): 39year old right-handed female, with history of high-grade invasive ductal carcinoma estrogen receptor positive breast cancer, status post bilateral mastectomy, right axillary lymph node dissection, status post chemotherapy (docetaxel, carboplatin, trastuzumab), here for evaluation of headaches and seizure disorder. Patient previously seen by Dr. CEstella Husk then Dr. HGaynell Face for a number of years for possible  seizure disorder. First seizure occurred in 2008. Apparently she had vomiting, loss of consciousness, convulsions and then confusion. She's tried on a variety of anticonvulsants but her seizures seem to get worse over time. [facility], but unclear if any spells were captured. She was taken off of all of her anticonvulsant medications and she had no further events. Therefore conclusion was that these may have been nonepileptic spells. She's had no further events until the past few months which had increasing similar spells of confusion, bilateral leg weakness, falling down, confusion. One episode involved bilateral leg convulsions without loss of consciousness. Over the past one year she's had increasing headaches, bandlike sensation across her head sometimes over her forehead, with pounding sensation, photophobia, phonophobia nausea. She's tried ibuprofen, Tylenol, Aleve without significant relief. She tried taking her relatives gabapentin 600 mg tablet x1, which significantly helped her headaches. Aggravating factors include drinking 3-4 beers every night, drinking up to 1 L of caffeinated soda per day, and increasing stress.  REVIEW OF SYSTEMS: Full 14 system review of systems performed and notable only for improved appetite and HA.    ALLERGIES: Allergies  Allergen Reactions  . Tramadol Other (See Comments)    Seizures  . Phenytoin Nausea And Vomiting  . Thorazine [Chlorpromazine Hcl] Hives  . Ciprofloxacin Nausea And Vomiting  . Vancomycin Rash    Possible rash reported by MD    HOME MEDICATIONS: Outpatient Prescriptions Prior to Visit  Medication Sig Dispense Refill  . ALPRAZolam (XANAX) 1 MG tablet TAKE 1 TABLET BY MOUTH THREE TIMES DAILY AS NEEDED FOR ANXIETY 90 tablet 1  . citalopram (  CELEXA) 40 MG tablet Take 1 tablet (40 mg total) by mouth daily. 90 tablet 3  . esomeprazole (NEXIUM) 20 MG capsule Take 20 mg by mouth daily at 12  noon.    . fluticasone (FLONASE) 50 MCG/ACT nasal spray Place 2 sprays into both nostrils daily. 16 g 6  . goserelin (ZOLADEX) 10.8 MG injection Inject 10.8 mg into the skin every 3 (three) months.    Marland Kitchen letrozole (FEMARA) 2.5 MG tablet Take 1 tablet (2.5 mg total) by mouth daily. 90 tablet 4  . predniSONE (DELTASONE) 10 MG tablet Take 10 mg by mouth 3 (three) times daily.    Marland Kitchen topiramate (TOPAMAX) 50 MG tablet TAKE TWO TABLETS BY MOUTH TWICE DAILY 120 tablet 0   No facility-administered medications prior to visit.    PAST MEDICAL HISTORY: Past Medical History  Diagnosis Date  . Status post chemotherapy     docetaxel/carboplatin/trastuzumab  . Anxiety   . Hx MRSA infection 03/28/11    Skin  . History of breast cancer DX JUNE 2012 W/ RIGHT BREAST INVASIVE DUCTAL CA  STAGE IIB---  S/P BILATERAL MASTECTOMIES AND RIGHT NODE DISSECTION    ONCOLOGIST-- DR Jana Hakim--  CHEMO ENDED [place] 2012 / RADIATION ENDED MAR 2013--  CURRENT ON TAMOXIFEN AND ZOLADEX  . Family history of anesthesia complication     MOTHER PONV  . Hot flashes due to tamoxifen   . History of idiopathic seizure     DX 2008 --  LAST ONE 2012--  NO ISSUES SINCE AND NO MEDS. -- PT STATES DOCTORS FELT IT WAS STRESS RELATED DUE TO HEALTH ISSUES  . Bipolar affective disorder, mixed   . GERD (gastroesophageal reflux disease)     OCCASIONALLY TAKE ZANTAC  . Left ovarian cyst   . Right lower quadrant abdominal pain   . Frequency of urination   . Urgency of urination   . Strains to urinate   . Nocturia   . Heart palpitations   . Cancer   . Depression   . Seizures     PAST SURGICAL HISTORY: Past Surgical History  Procedure Laterality Date  . Wisdom tooth extraction    . Biopsy breast  10/14/2010    right needle core biopsy  . Portacath placement  11/16/2010    placement of left subclavian port  . I&d extremity  09/26/2011    Procedure: IRRIGATION AND DEBRIDEMENT EXTREMITY;  Surgeon: Tennis Must, MD;  Location: [facility];   Service: Orthopedics;  Laterality: Right;  I&D right hand cat bite wound  . Breast surgery  11/16/2010    bilateral mastectomy+ right axillary node dissection,T1cN1a, Her2+,ERPR+  (right breast invasive ductal carcinoma)  . Transthoracic echocardiogram  05-28-2012    LVF NORMAL/  EF 55-60%  . Cysto with hydrodistension N/A 07/23/2012    Procedure: CYSTOSCOPY/HYDRODISTENSION instillation of marcaine and pyridium;  Surgeon: Reece Packer, MD;  Location: [facility];  Service: Urology;  Laterality: N/A;  INSTILLATION      FAMILY HISTORY: Family History  Problem Relation Age of Onset  . Cancer Maternal Grandfather   . Mental illness Brother   . Mental illness Son     SOCIAL HISTORY:  Social History   Social History  . Marital Status: Married    Spouse Name: Glendell Docker  . Number of Children: 1  . Years of Education: HS   Occupational History  .     Social History Main Topics  . Smoking status: Current Every Day Smoker -- 1.00 packs/day  for 20 years    Types: Cigarettes  . Smokeless tobacco: Never Used  . Alcohol Use: Yes     Comment: 12/18/14 sporadically, socially  . Drug Use: No     Comment: LAST USED COCAINE AND [place] 2011  . Sexual Activity:    Partners: Male    Birth Control/ Protection: Injection     Comment: "zylodex" to "shut down ovaries"   Other Topics Concern  . Not on file   Social History Narrative   Married 4 years   1 son age 72   Unemployed      Grandfather died unknown malignacy   No family h/o breast, ovarian or colon ca      Menarche age 19      Patient lives at home with spouse.   Caffeine Use: Up to 1 pot of coffee daily                    PHYSICAL EXAM  Filed Vitals:   12/18/14 1439  BP: 132/92  Pulse: 99  Height: '5\' 4"'  (1.626 m)  Weight: 116 lb 6.4 oz (52.799 kg)    Not recorded     Wt Readings from Last 3 Encounters:  12/18/14 116 lb 6.4 oz (52.799 kg)  12/09/14 112 lb 14.4 oz (51.211 kg)  11/14/14  108 lb (48.988 kg)   Body mass index is 19.97 kg/(m^2).  GENERAL EXAM: Patient is in no distress; well developed, nourished and groomed; neck is supple  CARDIOVASCULAR: Regular rate and rhythm, no murmurs, no carotid bruits  NEUROLOGIC: MENTAL STATUS: awake, alert, language fluent, comprehension intact, naming intact, fund of knowledge appropriate CRANIAL NERVE:  pupils equal and reactive to light, visual fields full to confrontation, extraocular muscles intact, no nystagmus, facial sensation and strength symmetric, hearing intact, palate elevates symmetrically, uvula midline, shoulder shrug symmetric, tongue midline. MOTOR: Normal bulk and tone, full strength in the BUE, BLE SENSORY: normal and symmetric to light touch COORDINATION: finger-nose-finger, fine finger movements, normal REFLEXES: deep tendon reflexes present and symmetric GAIT/STATION: narrow based gait; TANDEM STABLE.     DIAGNOSTIC DATA (LABS, IMAGING, TESTING) - I reviewed patient records, labs, notes, testing and imaging myself where available.  Lab Results  Component Value Date   WBC 6.0 11/05/2014   HGB 15.6 11/05/2014   HCT 45.5 11/05/2014   MCV 96.6 11/05/2014   PLT 193 11/05/2014      Component Value Date/Time   NA 145 11/05/2014 1121   NA 138 08/22/2012 1700   K 4.6 11/05/2014 1121   K 3.7 08/22/2012 1700   CL 105 10/11/2012 1441   CL 103 08/22/2012 1700   CO2 23 11/05/2014 1121   CO2 23 08/22/2012 1700   GLUCOSE 92 11/05/2014 1121   GLUCOSE 97 10/11/2012 1441   GLUCOSE 89 08/22/2012 1700   BUN 11.7 11/05/2014 1121   BUN 11 08/22/2012 1700   CREATININE 1.0 11/05/2014 1121   CREATININE 0.96 08/22/2012 1700   CALCIUM 10.1 11/05/2014 1121   CALCIUM 9.1 08/22/2012 1700   PROT 7.0 11/05/2014 1121   PROT 6.3 08/22/2012 1700   ALBUMIN 4.4 11/05/2014 1121   ALBUMIN 3.6 08/22/2012 1700   AST 22 11/05/2014 1121   AST 33 08/22/2012 1700   ALT 24 11/05/2014 1121   ALT 24 08/22/2012 1700   ALKPHOS  97 11/05/2014 1121   ALKPHOS 87 08/22/2012 1700   BILITOT 0.41 11/05/2014 1121   BILITOT 0.2* 08/22/2012 1700   GFRNONAA 76* 08/22/2012 1700  GFRAA 88* 08/22/2012 1700   No results found for: CHOL No results found for: HGBA1C No results found for: VITAMINB12 Lab Results  Component Value Date   TSH 1.169 11/05/2014     01/16/13 MRI BRAIN - No evidence of metastatic disease. Chronic abnormal T2 signal within the pons, probably related to previous treatment.  01/16/13 MRI CERVICAL - No evidence of metastatic disease. Mild degenerative disease at C5-6 and C6-7 without gross neural compression.    ASSESSMENT AND PLAN  39 y.o. year old female here with migraine and tension headaches, with good response to topiramate. Weight loss problem has improved. She will monitor and try to increase calorie intake.    PLAN: I spent 15 minutes of face to face time with patient. Greater than 50% of time was spent in counseling and coordination of care with patient. In summary we discussed:  - continue topiramate for migraine prevention   Meds ordered this encounter  Medications  . topiramate (TOPAMAX) 100 MG tablet    Sig: Take 1 tablet (100 mg total) by mouth 2 (two) times daily.    Dispense:  60 tablet    Refill:  12   Return in about 1 year (around 12/18/2015).    Penni Bombard, MD 5/37/4827, 0:78 PM Certified in Neurology, Neurophysiology and Neuroimaging  [facility] [address] [place],  [address] [phone] Crystal [name].

## 2014-12-18 NOTE — Patient Instructions (Signed)
Continue topiramate 100mg  twice a day. Note new pill strength.

## 2014-12-29 ENCOUNTER — Telehealth: Payer: Self-pay | Admitting: *Deleted

## 2014-12-29 ENCOUNTER — Other Ambulatory Visit: Payer: Self-pay | Admitting: Family Medicine

## 2014-12-29 MED ORDER — PREDNISONE 10 MG PO TABS: 10.0000 mg | ORAL_TABLET | Freq: Three times a day (TID) | ORAL | Status: DC

## 2014-12-29 NOTE — Telephone Encounter (Signed)
This RN received call from pt stating she needs a refill and new dosage instructions per prednisone.  Selda states she is taking the steroids at 10mg  tid.  Noted per visit with Dr Leta Baptist as documented " TID".  Per note from nutritionist " she has increased the dose herself to TID - she feels that works best for her "  She has been taking the TID " about a month "  Kinlie states above dose is beneficial- she has gained weight- enjoys eating and " feel good "- " seen a counselor for the first time in long time and that went good ".  Kensli states " I have gained weight "- and she feels good about herself and the weight gain.  Per review with MD- pt may continue the TID dose at present until scheduled visit with this office on 9/28.  Discussion of use of steroid for long term and known bone heatlh will be discussed at that visit.  Debany verbalized understanding of above.  Refill sent to pharmacy with new instruction and dispense quanity.

## 2014-12-29 NOTE — Telephone Encounter (Signed)
WOULD LIKE TO TALK TO DR.[name]'S NURSE, VAL DODD,RN CONCERNING THE PREDNISONE.

## 2015-01-12 ENCOUNTER — Ambulatory Visit: Payer: Medicare Other | Admitting: *Deleted

## 2015-01-28 ENCOUNTER — Telehealth: Payer: Self-pay | Admitting: Nurse Practitioner

## 2015-01-28 ENCOUNTER — Ambulatory Visit (HOSPITAL_BASED_OUTPATIENT_CLINIC_OR_DEPARTMENT_OTHER): Payer: Medicare Other | Admitting: Nurse Practitioner

## 2015-01-28 ENCOUNTER — Ambulatory Visit (HOSPITAL_BASED_OUTPATIENT_CLINIC_OR_DEPARTMENT_OTHER): Payer: Medicare Other

## 2015-01-28 ENCOUNTER — Other Ambulatory Visit (HOSPITAL_BASED_OUTPATIENT_CLINIC_OR_DEPARTMENT_OTHER): Payer: Medicare Other

## 2015-01-28 ENCOUNTER — Encounter: Payer: Self-pay | Admitting: Nurse Practitioner

## 2015-01-28 VITALS — BP 113/82 | HR 90 | Temp 98.6°F | Resp 18 | Ht 64.0 in | Wt 119.5 lb

## 2015-01-28 DIAGNOSIS — I82623 Acute embolism and thrombosis of deep veins of upper extremity, bilateral: Secondary | ICD-10-CM

## 2015-01-28 DIAGNOSIS — Z5111 Encounter for antineoplastic chemotherapy: Secondary | ICD-10-CM

## 2015-01-28 DIAGNOSIS — C773 Secondary and unspecified malignant neoplasm of axilla and upper limb lymph nodes: Secondary | ICD-10-CM

## 2015-01-28 DIAGNOSIS — Z79899 Other long term (current) drug therapy: Secondary | ICD-10-CM

## 2015-01-28 DIAGNOSIS — C50411 Malignant neoplasm of upper-outer quadrant of right female breast: Secondary | ICD-10-CM

## 2015-01-28 DIAGNOSIS — F5000 Anorexia nervosa, unspecified: Secondary | ICD-10-CM

## 2015-01-28 DIAGNOSIS — Z170 Estrogen receptor positive status [ER+]: Secondary | ICD-10-CM

## 2015-01-28 DIAGNOSIS — Z79811 Long term (current) use of aromatase inhibitors: Secondary | ICD-10-CM | POA: Diagnosis not present

## 2015-01-28 DIAGNOSIS — E43 Unspecified severe protein-calorie malnutrition: Secondary | ICD-10-CM

## 2015-01-28 LAB — COMPREHENSIVE METABOLIC PANEL (CC13)
ALBUMIN: 4.2 g/dL (ref 3.5–5.0)
ALK PHOS: 93 U/L (ref 40–150)
ALT: 16 U/L (ref 0–55)
ANION GAP: 9 meq/L (ref 3–11)
AST: 21 U/L (ref 5–34)
BILIRUBIN TOTAL: 0.55 mg/dL (ref 0.20–1.20)
BUN: 9.5 mg/dL (ref 7.0–26.0)
CALCIUM: 9.5 mg/dL (ref 8.4–10.4)
CO2: 19 meq/L — AB (ref 22–29)
Chloride: 112 mEq/L — ABNORMAL HIGH (ref 98–109)
Creatinine: 0.8 mg/dL (ref 0.6–1.1)
EGFR: 90 mL/min/{1.73_m2} — AB (ref >90)
Glucose: 90 mg/dl (ref 70–140)
Potassium: 3.6 mEq/L (ref 3.5–5.1)
Sodium: 140 mEq/L (ref 136–145)
TOTAL PROTEIN: 6.8 g/dL (ref 6.4–8.3)

## 2015-01-28 LAB — CBC WITH DIFFERENTIAL/PLATELET
BASO%: 0.6 % (ref 0.0–2.0)
Basophils Absolute: 0 10*3/uL (ref 0.0–0.1)
EOS%: 1.7 % (ref 0.0–7.0)
Eosinophils Absolute: 0.1 10*3/uL (ref 0.0–0.5)
HEMATOCRIT: 45.7 % (ref 34.8–46.6)
HGB: 15.7 g/dL (ref 11.6–15.9)
LYMPH#: 1.5 10*3/uL (ref 0.9–3.3)
LYMPH%: 19.4 % (ref 14.0–49.7)
MCH: 32.7 pg (ref 25.1–34.0)
MCHC: 34.3 g/dL (ref 31.5–36.0)
MCV: 95.2 fL (ref 79.5–101.0)
MONO#: 0.4 10*3/uL (ref 0.1–0.9)
MONO%: 4.6 % (ref 0.0–14.0)
NEUT%: 73.7 % (ref 38.4–76.8)
NEUTROS ABS: 5.8 10*3/uL (ref 1.5–6.5)
Platelets: 184 10*3/uL (ref 145–400)
RBC: 4.81 10*6/uL (ref 3.70–5.45)
RDW: 13.3 % (ref 11.2–14.5)
WBC: 7.9 10*3/uL (ref 3.9–10.3)

## 2015-01-28 LAB — TSH CHCC: TSH: 1.492 m[IU]/L (ref 0.308–3.960)

## 2015-01-28 MED ORDER — PREDNISONE 5 MG PO TABS: 5.0000 mg | ORAL_TABLET | ORAL | Status: DC

## 2015-01-28 MED ORDER — GOSERELIN ACETATE 10.8 MG ~~LOC~~ IMPL
10.8000 mg | DRUG_IMPLANT | SUBCUTANEOUS | Status: DC
  Administered 2015-01-28: 10.8 mg via SUBCUTANEOUS
  Filled 2015-01-28: qty 10.8

## 2015-01-28 NOTE — Telephone Encounter (Signed)
Gave avs & calendar for December. °

## 2015-01-28 NOTE — Progress Notes (Signed)
ID: Crystal [name]   DOB: Jan 11, 1976  MR#: [id number]  QAS#:[id number]  NLG:[id number], MD GYN: SULorine Bears, MD OTHER:  Thea Silversmith, Leigh Aurora, Rupinder [facility]   CHIEF COMPLAINT:  Hx of Right Breast Cancer CURRENT THERAPY: Goserelin,  letrozole   BREAST CANCER HISTORY: From the initial intake note:  "The patient is a 39 year old [name] who noted a mass in her right breast and brought it to her primary care physician's attention June of 2012. She was set up for mammography at [facility], where an area of calcifications highly suspicious for carcinoma was biopsied. This was in the upper outer quadrant and it measured 1.7 cm mammographically and 1.8 cm by ultrasonography. The pathology report ([id number]) showed a high-grade invasive ductal carcinoma estrogen receptor positive at 64% progesterone receptor positive at 18%, and HER-2 amplified with a ratio by CISH of 5.24. MIB-1-1 was 65%. A second mass biopsied at the same time was also triple positive.  Given her multiple masses in the right breast, mastectomy was recommended. The patient actually opted for bilateral mastectomies and this was performed together with a right axillary lymph node dissection under Dr. Star Age on 11/16/2010. The final pathology [id number]) showed, on the left, no evidence of cancer. On the right the patient had a high-grade invasive ductal carcinoma measuring 1.9 cm, with negative margins, grade 3, involving 2 of 16 lymph nodes (stage IIB)."  Her subsequent history is as detailed below.  INTERVAL HISTORY:  [name] returns today for follow up of her breast cancer. She continues on letrozole daily. This causes her hot flashes and vaginal dryness, but she manages these on her own. She is due for goserelin today, which she receives every 12 weeks. The interval history is remarkable for suddenly stopping the 52m prednisone she was taking every TID. Her family members kept commenting on her "moon face"  and she became self conscious. She forgot she was supposed to wean herself off. Her appetite is the same since stopping it 2 weeks ago, but she is not having more frequent headaches, mild nausea, and fatigue. Her weight is up to 120lb today, which was her goal. [facility] but has only been a few times so she could not tell me if she found it helpful or not. She uses topamax to prevent migraines.   REVIEW OF SYSTEMS: A detailed review of systems today was otherwise stable, except where noted above.  PAST MEDICAL HISTORY: Past Medical History  Diagnosis Date  . Status post chemotherapy     docetaxel/carboplatin/trastuzumab  . Anxiety   . Hx MRSA infection 03/28/11    Skin  . History of breast cancer DX JUNE 2012 W/ RIGHT BREAST INVASIVE DUCTAL CA  STAGE IIB---  S/P BILATERAL MASTECTOMIES AND RIGHT NODE DISSECTION    ONCOLOGIST-- DR MJana Hakim-  CHEMO ENDED DLenexa2012 / RADIATION ENDED MAR 2013--  CURRENT ON TAMOXIFEN AND ZOLADEX  . Family history of anesthesia complication     MOTHER PONV  . Hot flashes due to tamoxifen   . History of idiopathic seizure     DX 2008 --  LAST ONE 2012--  NO ISSUES SINCE AND NO MEDS. -- PT STATES DOCTORS FELT IT WAS STRESS RELATED DUE TO HEALTH ISSUES  . Bipolar affective disorder, mixed   . GERD (gastroesophageal reflux disease)     OCCASIONALLY TAKE ZANTAC  . Left ovarian cyst   . Right lower quadrant abdominal pain   .  Frequency of urination   . Urgency of urination   . Strains to urinate   . Nocturia   . Heart palpitations   . Cancer   . Depression   . Seizures   1. History of seizures. The patient has been thoroughly evaluated both here by Dr. Gaynell Face and at [facility] for this. Among many studies, she had an MRI of the brain on August 28th. This showed an incidental solitary small lesion in the left frontoparietal white matter. It was not felt to be suggestive of multiple sclerosis. A  noncontrasted CT in August 2010 was negative. The patient has been off all seizure medications now for 2 months. There has been no evidence of seizure recurrence. 2. Complex psychology history (please refer to Dr. Iona Coach note in the E-chart from August 29, 2009) with evidence of anxiety disorder, depression and psychosis. The patient is currently on no medications for this and denies any of these symptoms at present. 3. History of migraines. 4. Remote history of multidrug abuse. 5. Status post wisdom teeth extraction.         History of MRSA skin infection   PAST SURGICAL HISTORY: Past Surgical History  Procedure Laterality Date  . Wisdom tooth extraction    . Biopsy breast  10/14/2010    right needle core biopsy  . Portacath placement  11/16/2010    placement of left subclavian port  . I&d extremity  09/26/2011    Procedure: IRRIGATION AND DEBRIDEMENT EXTREMITY;  Surgeon: Tennis Must, MD;  Location: [facility];  Service: Orthopedics;  Laterality: Right;  I&D right hand cat bite wound  . Breast surgery  11/16/2010    bilateral mastectomy+ right axillary node dissection,T1cN1a, Her2+,ERPR+  (right breast invasive ductal carcinoma)  . Transthoracic echocardiogram  05-28-2012    LVF NORMAL/  EF 55-60%  . Cysto with hydrodistension N/A 07/23/2012    Procedure: CYSTOSCOPY/HYDRODISTENSION instillation of marcaine and pyridium;  Surgeon: Reece Packer, MD;  Location: [place];  Service: Urology;  Laterality: N/A;  INSTILLATION      FAMILY HISTORY Family History  Problem Relation Age of Onset  . Cancer Maternal Grandfather   . Mental illness Brother   . Mental illness Son   The patient's mother is living, in her mid 45s, with no history of breast or ovarian cancer. The patient does not know her biological father. The patient has an older brother with bipolar disease, and according to the patient, borderline schizophrenia.   GYNECOLOGIC HISTORY: (Updated January  2015) Menarche at age 39. She is G1, P1 with first pregnancy to term at age 62. She was not menopausal, and was still having regular periods at the time of her breast cancer diagnosis. She continues on every three-month goserelin.  SOCIAL HISTORY:  (Updated 05/21/2013) She used to work in a [employer] but had to leave that job because of the seizure problem. She's currently unemployed. Her husband , Glendell Docker, works as a Development worker, international aid. Belkys has a son, Liane Comber, who is 37 and recently moved in with his father. [name], according to the patient, has significant nausea problems. The patient attends a local [place]. Peggyann Juba is pastor.    ADVANCED DIRECTIVES: Not in place  HEALTH MAINTENANCE:  (Updated 05/21/2013) Social History  Substance Use Topics  . Smoking status: Current Every Day Smoker -- 1.00 packs/day for 20 years    Types: Cigarettes  . Smokeless tobacco: Never Used  . Alcohol Use: Yes     Comment:  12/18/14 sporadically, socially     Colonoscopy: Never  PAP: July 2012  Bone density:  Never  Lipid panel: Not on file  Allergies  Allergen Reactions  . Tramadol Other (See Comments)    Seizures  . Phenytoin Nausea And Vomiting  . Thorazine [Chlorpromazine Hcl] Hives  . Ciprofloxacin Nausea And Vomiting  . Vancomycin Rash    Possible rash reported by MD    Current Outpatient Prescriptions  Medication Sig Dispense Refill  . ALPRAZolam (XANAX) 1 MG tablet TAKE 1 TABLET BY MOUTH 3 TIMES A DAY AS NEEDED FOR ANXIETY 90 tablet 1  . citalopram (CELEXA) 40 MG tablet Take 1 tablet (40 mg total) by mouth daily. 90 tablet 3  . esomeprazole (NEXIUM) 20 MG capsule Take 20 mg by mouth daily at 12 noon.    . fluticasone (FLONASE) 50 MCG/ACT nasal spray Place 2 sprays into both nostrils daily. (Patient taking differently: Place 2 sprays into both nostrils as needed. ) 16 g 6  . goserelin (ZOLADEX) 10.8 MG injection Inject 10.8 mg into the skin every 3 (three) months.    Marland Kitchen  letrozole (FEMARA) 2.5 MG tablet Take 1 tablet (2.5 mg total) by mouth daily. 90 tablet 4  . topiramate (TOPAMAX) 100 MG tablet Take 1 tablet (100 mg total) by mouth 2 (two) times daily. 60 tablet 12  . predniSONE (DELTASONE) 5 MG tablet Take 1 tablet (5 mg total) by mouth every other day. 30 tablet 1   No current facility-administered medications for this visit.   Facility-Administered Medications Ordered in Other Visits  Medication Dose Route Frequency Provider Last Rate Last Dose  . goserelin (ZOLADEX) injection 10.8 mg  10.8 mg Subcutaneous Q90 days Chauncey Cruel, MD   10.8 mg at 01/28/15 1416   OBJECTIVE: Young white woman  In no acute distress Filed Vitals:   01/28/15 1300  BP: 113/82  Pulse: 90  Temp: 98.6 F (37 C)  Resp: 18  ECOG: 2 Body mass index is 20.5 kg/(m^2). Filed Weights   01/28/15 1300  Weight: 119 lb 8 oz (54.205 kg)   January 2015 weight was 146 pounds,  May 2015 weight was 136 pounds, August 2015 weight was 116 pounds.  Skin: warm, dry  HEENT: sclerae anicteric, conjunctivae pink, oropharynx clear. No thrush or mucositis.  Lymph Nodes: No cervical or supraclavicular lymphadenopathy  Lungs: clear to auscultation bilaterally, no rales, wheezes, or rhonci  Heart: regular rate and rhythm  Abdomen: round, soft, non tender, positive bowel sounds  Musculoskeletal: No focal spinal tenderness, no peripheral edema  Neuro: non focal, well oriented, positive affect  Breasts: bilateral breasts status post mastectomies with no reconstruction. No evidence of recurrent disease. Bilateral axillae benign.   LAB RESULTS: Lab Results  Component Value Date   WBC 7.9 01/28/2015   NEUTROABS 5.8 01/28/2015   HGB 15.7 01/28/2015   HCT 45.7 01/28/2015   MCV 95.2 01/28/2015   PLT 184 01/28/2015      Chemistry      Component Value Date/Time   NA 140 01/28/2015 1243   NA 138 08/22/2012 1700   K 3.6 01/28/2015 1243   K 3.7 08/22/2012 1700   CL 105 10/11/2012 1441    CL 103 08/22/2012 1700   CO2 19* 01/28/2015 1243   CO2 23 08/22/2012 1700   BUN 9.5 01/28/2015 1243   BUN 11 08/22/2012 1700   CREATININE 0.8 01/28/2015 1243   CREATININE 0.96 08/22/2012 1700      Component Value Date/Time  CALCIUM 9.5 01/28/2015 1243   CALCIUM 9.1 08/22/2012 1700   ALKPHOS 93 01/28/2015 1243   ALKPHOS 87 08/22/2012 1700   AST 21 01/28/2015 1243   AST 33 08/22/2012 1700   ALT 16 01/28/2015 1243   ALT 24 08/22/2012 1700   BILITOT 0.55 01/28/2015 1243   BILITOT 0.2* 08/22/2012 1700         STUDIES: No results found.   ASSESSMENT: 39 y.o.  [place] woman, known to be BRCA1 and 2 negative,   (1) status post bilateral mastectomies May of 2012 for a right-sided T1c N1 (stage II) grade 3 invasive ductal carcinoma,estrogen and progesterone receptor positive, HER-2 amplified, with an MIB-1 of 85%.   (2) status post carboplatin/ docetaxol/ trastuzumab x6, completed mid December 2012   (3) on trastuzumab every 3 weeks, started August of 2012, interrupted between April and August of 2013, completed 04/27/2012. Most recent echo 08/20/2012  (4) status post radiation therapy, completed 07/12/2011  (5) On tamoxifen starting April 2013, held as of 12/27/2012 with the diagnosis of a left upper extremity DVT.  On therapeutic Coumadin until  May 2015  (6) On goserelin given every 3 months, 1st injection on 10/26/11  (7) started on anastrozole November 2014, discontinued January 2015 because of side effects  (8) started letrozole August 2015  Other problems:  (1) Chronic Pain/ fibromyalgia  (2) history of seizure disorder   (3) history of severe migraines   (4)  Severe proteincalorie malnutrition   (5) left upper extremity DVT, port associated, documented age 84 12/19/2012; port removed 09/05/2013 and coumadin discontinued at that time   PLAN:  Abbegale is doing well as far as her breast cancer is concerned. There is no evidence of recurrent disease. She  tolerated the letrozole well and will continue this for at least 5 years of antiestrogen therapy along with the goserelin injections.   I have prescribed her 77m prednisone pills to take every other day for 2 weeks, then split in half to take 2.544mevery other day until she can successfully wean herself off of the steroids. She is reluctant to do this, but I think a slow taper will help eliminate some of her withdrawal symptoms. Fortunately, in the 2 weeks she has been off of the prednisone her appetite has not suffered.   Lisel will return in 3 months for a follow up visit with Dr. MaJana Hakimnd her next goserelin injection. She understands and agrees with this plan. She knows the goal of treatment in her case is cure. She has been encouraged to call with any issues that might arise before her next visit here.  HeLaurie PandaNP  01/28/2015

## 2015-02-12 ENCOUNTER — Other Ambulatory Visit: Payer: Self-pay | Admitting: Family Medicine

## 2015-02-23 ENCOUNTER — Ambulatory Visit (INDEPENDENT_AMBULATORY_CARE_PROVIDER_SITE_OTHER): Payer: Medicare Other | Admitting: Diagnostic Neuroimaging

## 2015-02-23 ENCOUNTER — Encounter: Payer: Self-pay | Admitting: Diagnostic Neuroimaging

## 2015-02-23 VITALS — BP 123/83 | HR 71 | Ht 64.0 in | Wt 123.0 lb

## 2015-02-23 DIAGNOSIS — G43009 Migraine without aura, not intractable, without status migrainosus: Secondary | ICD-10-CM | POA: Diagnosis not present

## 2015-02-23 MED ORDER — TOPIRAMATE 50 MG PO TABS
50.0000 mg | ORAL_TABLET | Freq: Two times a day (BID) | ORAL | Status: DC
Start: 2015-02-23 — End: 2017-01-03

## 2015-02-23 NOTE — Patient Instructions (Signed)
Thank you for coming to see [place] at [facility]. I hope we have been able to provide you high quality care today.  You may receive a patient satisfaction survey over the next few weeks. We would appreciate your feedback and comments so that we may continue to improve ourselves and the health of our patients.  - change topiramate to 50-129m twice a day   ~~~~~~~~~~~~~~~~~~~~~~~~~~~~~~~~~~~~~~~~~~~~~~~~~~~~~~~~~~~~~~~~~  DR. Jadis Pitter'S GUIDE TO HAPPY AND HEALTHY LIVING These are some of my general health and wellness recommendations. Some of them may apply to you better than others. Please use common sense as you try these suggestions and feel free to ask me any questions.   ACTIVITY/FITNESS Mental, social, emotional and physical stimulation are very important for brain and body health. Try learning a new activity (arts, music, language, sports, games).  Keep moving your body to the best of your abilities. You can do this at home, inside or outside, the park, community center, gym or anywhere you like. Consider a physical therapist or personal trainer to get started. Consider the app Sworkit. Fitness trackers such as smart-watches, smart-phones or Fitbits can help as well.   NUTRITION Eat more plants: colorful vegetables, nuts, seeds and berries.  Eat less sugar, salt, preservatives and processed foods.  Avoid toxins such as cigarettes and alcohol.  Drink water when you are thirsty. Warm water with a slice of lemon is an excellent morning drink to start the day.  Consider these websites for more information The Nutrition Source (hhttps://www.henry-hernandez.biz/ [employer] Nutrition (wWindowBlog.ch   RELAXATION Consider practicing mindfulness meditation or other relaxation techniques such as deep breathing, prayer, yoga, tai chi, massage. See website mindful.org or the apps Headspace or Calm to help get  started.   SLEEP Try to get at least 7-8+ hours sleep per day. Regular exercise and reduced caffeine will help you sleep better. Practice good sleep hygeine techniques. See website sleep.org for more information.   PLANNING Prepare estate planning, living will, healthcare POA documents. Sometimes this is best planned with the help of an attorney. Theconversationproject.org and agingwithdignity.org are excellent resources.

## 2015-02-23 NOTE — Progress Notes (Signed)
[facility]  PATIENT: Crystal [name] DOB: [birth year]  REFERRING CLINICIAN:  HISTORY FROM: patient REASON FOR VISIT: follow up   HISTORICAL  CHIEF COMPLAINT:  Chief Complaint  Patient presents with  . Migraine    rm "increasing in strength, migrianes returning"  . Follow-up    seen 6 weeks ago    HISTORY OF PRESENT ILLNESS:   UPDATE 02/23/15: Since last visit, was doing well, until 6 weeks ago. Now tapering off prednisone since past 1 month. HA are now worsening (3 migraines over last 2 months). Also on TPX 114m tabs instead of 2x588mtabs. Also more stress recently.   UPDATE 12/18/14: Since last visit, doing well. Weight and nutrition are improved. HA stable. Occ dull HA, but manageable. Tolerating TPX 10043mID.  UPDATE 06/19/14: Since last visit, HA are much improved. Sleep, mood and hot flashes improved. Patient attributes this to topiramate. Now having 1 day dull HA every 3 weeks. Also patient's significant change her diet over the past year. Now she is eating mainly vegetable-based diet, occasionally fish. She has had unintended 45 pound weight loss as a result of calorie restriction. Also off coumadin now. Also had her port removed.   UPDATE 06/07/13: Since last visit, tried gabapentin, but HA have worsened. Over last few months, daily HA, with photophobia. Using tylenol 2 tabs TID, on daily basis. Has stopped soda, but drinks 1 pot coffee between 8-noon. Poor sleep, more anxiety, more mood disturbances. Does not want to go back to psychiatry. Also with hot flashes, that interrupt sleep. No seizures or non-epileptic spells. Yesterday had morelethargy and confusion, but feels better today. Also, has started coumadin (Oct 2014) for LUE DVT.  PRIOR HPI (12/22/11): [age] old right-handed female, with history of high-grade invasive ductal carcinoma estrogen receptor positive breast cancer, status post bilateral mastectomy, right axillary lymph node dissection,  status post chemotherapy (docetaxel, carboplatin, trastuzumab), here for evaluation of headaches and seizure disorder. Patient previously seen by Dr. ChaEstella Huskhen Dr. HicGaynell Faceor a number of years for possible seizure disorder. First seizure occurred in 2008. Apparently she had vomiting, loss of consciousness, convulsions and then confusion. She's tried on a variety of anticonvulsants but her seizures seem to get worse over time. [facility], but unclear if any spells were captured. She was taken off of all of her anticonvulsant medications and she had no further events. Therefore conclusion was that these may have been nonepileptic spells. She's had no further events until the past few months which had increasing similar spells of confusion, bilateral leg weakness, falling down, confusion. One episode involved bilateral leg convulsions without loss of consciousness. Over the past one year she's had increasing headaches, bandlike sensation across her head sometimes over her forehead, with pounding sensation, photophobia, phonophobia nausea. She's tried ibuprofen, Tylenol, Aleve without significant relief. She tried taking her relatives gabapentin 600 mg tablet x1, which significantly helped her headaches. Aggravating factors include drinking 3-4 beers every night, drinking up to 1 L of caffeinated soda per day, and increasing stress.  REVIEW OF SYSTEMS: Full 14 system review of systems performed and notable only for fatigue hearing loss light sens blurred vision dizziness headache confusion.     ALLERGIES: Allergies  Allergen Reactions  . Tramadol Other (See Comments)    Seizures  . Phenytoin Nausea And Vomiting  . Thorazine [Chlorpromazine Hcl] Hives  . Ciprofloxacin Nausea And Vomiting  . Vancomycin Rash    Possible rash reported  by MD    HOME MEDICATIONS: Outpatient Prescriptions Prior to Visit  Medication Sig Dispense Refill  .  ALPRAZolam (XANAX) 1 MG tablet TAKE 1 TABLET BY MOUTH 3 TIMES A DAY AS NEEDED FOR ANXIETY 90 tablet 1  . citalopram (CELEXA) 40 MG tablet TAKE ONE TABLET BY MOUTH ONE TIME DAILY   "OFFICE VISIT NEEDED FOR REFILLS" 30 tablet 0  . esomeprazole (NEXIUM) 20 MG capsule Take 20 mg by mouth daily at 12 noon.    . fluticasone (FLONASE) 50 MCG/ACT nasal spray Place 2 sprays into both nostrils daily. (Patient taking differently: Place 2 sprays into both nostrils as needed. ) 16 g 6  . goserelin (ZOLADEX) 10.8 MG injection Inject 10.8 mg into the skin every 3 (three) months.    Marland Kitchen letrozole (FEMARA) 2.5 MG tablet Take 1 tablet (2.5 mg total) by mouth daily. 90 tablet 4  . predniSONE (DELTASONE) 5 MG tablet Take 1 tablet (5 mg total) by mouth every other day. 30 tablet 1  . topiramate (TOPAMAX) 100 MG tablet Take 1 tablet (100 mg total) by mouth 2 (two) times daily. 60 tablet 12   No facility-administered medications prior to visit.    PAST MEDICAL HISTORY: Past Medical History  Diagnosis Date  . Status post chemotherapy     docetaxel/carboplatin/trastuzumab  . Anxiety   . Hx MRSA infection 03/28/11    Skin  . History of breast cancer DX JUNE 2012 W/ RIGHT BREAST INVASIVE DUCTAL CA  STAGE IIB---  S/P BILATERAL MASTECTOMIES AND RIGHT NODE DISSECTION    ONCOLOGIST-- DR Jana Hakim--  CHEMO ENDED [place] 2012 / RADIATION ENDED MAR 2013--  CURRENT ON TAMOXIFEN AND ZOLADEX  . Family history of anesthesia complication     MOTHER PONV  . Hot flashes due to tamoxifen   . History of idiopathic seizure     DX 2008 --  LAST ONE 2012--  NO ISSUES SINCE AND NO MEDS. -- PT STATES DOCTORS FELT IT WAS STRESS RELATED DUE TO HEALTH ISSUES  . Bipolar affective disorder, mixed ([place])   . GERD (gastroesophageal reflux disease)     OCCASIONALLY TAKE ZANTAC  . Left ovarian cyst   . Right lower quadrant abdominal pain   . Frequency of urination   . Urgency of urination   . Strains to urinate   . Nocturia   . Heart  palpitations   . Cancer ([place])   . Depression   . Seizures ([place])     PAST SURGICAL HISTORY: Past Surgical History  Procedure Laterality Date  . Wisdom tooth extraction    . Biopsy breast  10/14/2010    right needle core biopsy  . Portacath placement  11/16/2010    placement of left subclavian port  . I&d extremity  09/26/2011    Procedure: IRRIGATION AND DEBRIDEMENT EXTREMITY;  Surgeon: Tennis Must, MD;  Location: [facility];  Service: Orthopedics;  Laterality: Right;  I&D right hand cat bite wound  . Breast surgery  11/16/2010    bilateral mastectomy+ right axillary node dissection,T1cN1a, Her2+,ERPR+  (right breast invasive ductal carcinoma)  . Transthoracic echocardiogram  05-28-2012    LVF NORMAL/  EF 55-60%  . Cysto with hydrodistension N/A 07/23/2012    Procedure: CYSTOSCOPY/HYDRODISTENSION instillation of marcaine and pyridium;  Surgeon: Reece Packer, MD;  Location: [facility];  Service: Urology;  Laterality: N/A;  INSTILLATION      FAMILY HISTORY: Family History  Problem Relation Age of Onset  . Cancer Maternal Grandfather   .  Mental illness Brother   . Mental illness Son     SOCIAL HISTORY:  Social History   Social History  . Marital Status: Married    Spouse Name: Glendell Docker  . Number of Children: 1  . Years of Education: HS   Occupational History  .     Social History Main Topics  . Smoking status: Current Every Day Smoker -- 1.00 packs/day for 20 years    Types: Cigarettes  . Smokeless tobacco: Never Used  . Alcohol Use: Yes     Comment: 12/18/14 sporadically, socially  . Drug Use: No     Comment: LAST USED COCAINE AND [place] 2011  . Sexual Activity:    Partners: Male    Birth Control/ Protection: Injection     Comment: "zylodex" to "shut down ovaries"   Other Topics Concern  . Not on file   Social History Narrative   Married 4 years   1 son age 75   Unemployed      Grandfather died unknown malignacy   No family h/o breast,  ovarian or colon ca      Menarche age 64      Patient lives at home with spouse.   Caffeine Use: Up to 1 pot of coffee daily                    PHYSICAL EXAM  Filed Vitals:   02/23/15 1514  BP: 123/83  Pulse: 71  Height: '5\' 4"'  (1.626 m)  Weight: 123 lb (55.792 kg)    Not recorded     Wt Readings from Last 3 Encounters:  02/23/15 123 lb (55.792 kg)  01/28/15 119 lb 8 oz (54.205 kg)  12/18/14 116 lb 6.4 oz (52.799 kg)   Body mass index is 21.1 kg/(m^2).  GENERAL EXAM: Patient is in no distress; well developed, nourished and groomed; neck is supple  CARDIOVASCULAR: Regular rate and rhythm, no murmurs, no carotid bruits  NEUROLOGIC: MENTAL STATUS: awake, alert, language fluent, comprehension intact, naming intact, fund of knowledge appropriate CRANIAL NERVE:  pupils equal and reactive to light, visual fields full to confrontation, extraocular muscles intact, no nystagmus, facial sensation and strength symmetric, hearing intact, palate elevates symmetrically, uvula midline, shoulder shrug symmetric, tongue midline. MOTOR: Normal bulk and tone, full strength in the BUE, BLE SENSORY: normal and symmetric to light touch COORDINATION: finger-nose-finger, fine finger movements, normal REFLEXES: deep tendon reflexes present and symmetric GAIT/STATION: narrow based gait; TANDEM STABLE.     DIAGNOSTIC DATA (LABS, IMAGING, TESTING) - I reviewed patient records, labs, notes, testing and imaging myself where available.  Lab Results  Component Value Date   WBC 7.9 01/28/2015   HGB 15.7 01/28/2015   HCT 45.7 01/28/2015   MCV 95.2 01/28/2015   PLT 184 01/28/2015      Component Value Date/Time   NA 140 01/28/2015 1243   NA 138 08/22/2012 1700   K 3.6 01/28/2015 1243   K 3.7 08/22/2012 1700   CL 105 10/11/2012 1441   CL 103 08/22/2012 1700   CO2 19* 01/28/2015 1243   CO2 23 08/22/2012 1700   GLUCOSE 90 01/28/2015 1243   GLUCOSE 97 10/11/2012 1441   GLUCOSE 89  08/22/2012 1700   BUN 9.5 01/28/2015 1243   BUN 11 08/22/2012 1700   CREATININE 0.8 01/28/2015 1243   CREATININE 0.96 08/22/2012 1700   CALCIUM 9.5 01/28/2015 1243   CALCIUM 9.1 08/22/2012 1700   PROT 6.8 01/28/2015 1243   PROT 6.3  08/22/2012 1700   ALBUMIN 4.2 01/28/2015 1243   ALBUMIN 3.6 08/22/2012 1700   AST 21 01/28/2015 1243   AST 33 08/22/2012 1700   ALT 16 01/28/2015 1243   ALT 24 08/22/2012 1700   ALKPHOS 93 01/28/2015 1243   ALKPHOS 87 08/22/2012 1700   BILITOT 0.55 01/28/2015 1243   BILITOT 0.2* 08/22/2012 1700   GFRNONAA 76* 08/22/2012 1700   GFRAA 88* 08/22/2012 1700   No results found for: CHOL No results found for: HGBA1C No results found for: VITAMINB12 Lab Results  Component Value Date   TSH 1.492 01/28/2015     01/16/13 MRI BRAIN - No evidence of metastatic disease. Chronic abnormal T2 signal within the pons, probably related to previous treatment.  01/16/13 MRI CERVICAL - No evidence of metastatic disease. Mild degenerative disease at C5-6 and C6-7 without gross neural compression.    ASSESSMENT AND PLAN  39 y.o. year old female here with migraine and tension headaches, with good response to topiramate. Weight loss problem has improved. She will monitor and try to increase calorie intake.    PLAN: I spent 15 minutes of face to face time with patient. Greater than 50% of time was spent in counseling and coordination of care with patient. In summary we discussed:  - continue topiramate for migraine prevention; will go back to TPX 2 x 1m tabs BID which seemed to work better per patient - monitor stress, sleep factors - in future may consider add on gabapentin  Meds ordered this encounter  Medications  . topiramate (TOPAMAX) 50 MG tablet    Sig: Take 1-2 tablets (50-100 mg total) by mouth 2 (two) times daily.    Dispense:  120 tablet    Refill:  12   Return in about 3 months (around 05/26/2015).    VPenni Bombard MD 118/34/3735 37:89 PM Certified in Neurology, Neurophysiology and Neuroimaging  [facility] [address] [place] [phone].

## 2015-03-16 ENCOUNTER — Other Ambulatory Visit: Payer: Self-pay | Admitting: Family Medicine

## 2015-03-17 ENCOUNTER — Ambulatory Visit (INDEPENDENT_AMBULATORY_CARE_PROVIDER_SITE_OTHER): Payer: Medicare Other | Admitting: Emergency Medicine

## 2015-03-17 VITALS — BP 110/82 | HR 74 | Temp 98.4°F | Resp 18 | Ht 65.0 in | Wt 122.6 lb

## 2015-03-17 DIAGNOSIS — J209 Acute bronchitis, unspecified: Secondary | ICD-10-CM | POA: Diagnosis not present

## 2015-03-17 DIAGNOSIS — J0140 Acute pansinusitis, unspecified: Secondary | ICD-10-CM | POA: Diagnosis not present

## 2015-03-17 MED ORDER — AMOXICILLIN-POT CLAVULANATE 875-125 MG PO TABS: 1.0000 | ORAL_TABLET | Freq: Two times a day (BID) | ORAL | Status: DC

## 2015-03-17 MED ORDER — PSEUDOEPHEDRINE-GUAIFENESIN ER 60-600 MG PO TB12: 1.0000 | ORAL_TABLET | Freq: Two times a day (BID) | ORAL | Status: DC

## 2015-03-17 MED ORDER — HYDROCOD POLST-CPM POLST ER 10-8 MG/5ML PO SUER: 5.0000 mL | Freq: Two times a day (BID) | ORAL | Status: DC

## 2015-03-17 NOTE — Progress Notes (Signed)
Subjective:  Patient ID: Crystal [name], female    DOB: November 12, 1975  Age: 39 y.o. MRN: [id number]  CC: Cough and Sore Throat   HPI Crystal [name] presents   With nasal congestion postnasal drainage and a cough. She has purulent nasal discharge. Her cough is productive of purulent sputum. She has some scattered wheezing. She has no shortness of breath however. She's never knew used an inhaler or nebulizer. She's never had asthma. She has no fever  But has chills. She has no nausea vomiting or stool change. No rash.  History Crystal [name] has a past medical history of Status post chemotherapy; Anxiety; MRSA infection (03/28/11); History of breast cancer (DX JUNE 2012 W/ RIGHT BREAST INVASIVE DUCTAL CA  STAGE IIB---  S/P BILATERAL MASTECTOMIES AND RIGHT NODE DISSECTION); Family history of anesthesia complication; Hot flashes due to tamoxifen; History of idiopathic seizure; Bipolar affective disorder, mixed ([place]); GERD (gastroesophageal reflux disease); Left ovarian cyst; Right lower quadrant abdominal pain; Frequency of urination; Urgency of urination; Strains to urinate; Nocturia; Heart palpitations; Cancer ([place]); Depression; and Seizures ([place]).   She has past surgical history that includes Wisdom tooth extraction; Biopsy breast (10/14/2010); Portacath placement (11/16/2010); I&D extremity (09/26/2011); Breast surgery (11/16/2010); transthoracic echocardiogram (05-28-2012); and cysto with hydrodistension (N/A, 07/23/2012).   Her  family history includes Cancer in her maternal grandfather; Mental illness in her brother and son.  She   reports that she has been smoking Cigarettes.  She has a 20 pack-year smoking history. She has never used smokeless tobacco. She reports that she drinks alcohol. She reports that she does not use illicit drugs.  Outpatient Prescriptions Prior to Visit  Medication Sig Dispense Refill  . ALPRAZolam (XANAX) 1 MG tablet TAKE 1 TABLET BY MOUTH 3 TIMES A DAY AS NEEDED FOR  ANXIETY 90 tablet 1  . citalopram (CELEXA) 40 MG tablet TAKE ONE TABLET BY MOUTH ONE TIME DAILY   "OFFICE VISIT NEEDED FOR REFILLS" 30 tablet 0  . esomeprazole (NEXIUM) 20 MG capsule Take 20 mg by mouth daily at 12 noon.    . fluticasone (FLONASE) 50 MCG/ACT nasal spray Place 2 sprays into both nostrils daily. (Patient taking differently: Place 2 sprays into both nostrils as needed. ) 16 g 6  . goserelin (ZOLADEX) 10.8 MG injection Inject 10.8 mg into the skin every 3 (three) months.    Marland Kitchen letrozole (FEMARA) 2.5 MG tablet Take 1 tablet (2.5 mg total) by mouth daily. 90 tablet 4  . topiramate (TOPAMAX) 50 MG tablet Take 1-2 tablets (50-100 mg total) by mouth 2 (two) times daily. 120 tablet 12  . predniSONE (DELTASONE) 5 MG tablet Take 1 tablet (5 mg total) by mouth every other day. (Patient not taking: Reported on 03/17/2015) 30 tablet 1   No facility-administered medications prior to visit.    Social History   Social History  . Marital Status: Married    Spouse Name: Glendell Docker  . Number of Children: 1  . Years of Education: HS   Occupational History  .     Social History Main Topics  . Smoking status: Current Every Day Smoker -- 1.00 packs/day for 20 years    Types: Cigarettes  . Smokeless tobacco: Never Used  . Alcohol Use: Yes     Comment: 12/18/14 sporadically, socially  . Drug Use: No     Comment: LAST USED COCAINE AND [place] 2011  . Sexual Activity:    Partners: Male    Patent examiner Protection: Injection  Comment: "zylodex" to "shut down ovaries"   Other Topics Concern  . None   Social History Narrative   Married 4 years   1 son age 53   Unemployed      Grandfather died unknown malignacy   No family h/o breast, ovarian or colon ca      Menarche age 32      Patient lives at home with spouse.   Caffeine Use: Up to 1 pot of coffee daily                    Review of Systems  Constitutional: Positive for chills. Negative for fever and appetite change.    HENT: Positive for congestion, postnasal drip, rhinorrhea and sinus pressure. Negative for ear pain and sore throat.   Eyes: Negative for pain and redness.  Respiratory: Positive for cough. Negative for shortness of breath and wheezing.   Cardiovascular: Negative for leg swelling.  Gastrointestinal: Negative for nausea, vomiting, abdominal pain, diarrhea, constipation and blood in stool.  Endocrine: Negative for polyuria.  Genitourinary: Negative for dysuria, urgency, frequency and flank pain.  Musculoskeletal: Negative for gait problem.  Skin: Negative for rash.  Neurological: Negative for weakness and headaches.  Psychiatric/Behavioral: Negative for confusion and decreased concentration. The patient is not nervous/anxious.     Objective:  BP 110/82 mmHg  Pulse 74  Temp(Src) 98.4 F (36.9 C) (Oral)  Resp 18  Ht 5\' 5"  (1.651 m)  Wt 122 lb 9.6 oz (55.611 kg)  BMI 20.40 kg/m2  SpO2 97%  Physical Exam  Constitutional: She is oriented to person, place, and time. She appears well-developed and well-nourished. No distress.  HENT:  Head: Normocephalic and atraumatic.  Right Ear: External ear normal.  Left Ear: External ear normal.  Nose: Nose normal.  Eyes: Conjunctivae and EOM are normal. Pupils are equal, round, and reactive to light. No scleral icterus.  Neck: Normal range of motion. Neck supple. No tracheal deviation present.  Cardiovascular: Normal rate, regular rhythm and normal heart sounds.   Pulmonary/Chest: Effort normal. No respiratory distress. She has no wheezes. She has no rales.  Abdominal: She exhibits no mass. There is no tenderness. There is no rebound and no guarding.  Musculoskeletal: She exhibits no edema.  Lymphadenopathy:    She has no cervical adenopathy.  Neurological: She is alert and oriented to person, place, and time. Coordination normal.  Skin: Skin is warm and dry. No rash noted.  Psychiatric: She has a normal mood and affect. Her behavior is normal.       Assessment & Plan:   Crystal [name] was seen today for cough and sore throat.  Diagnoses and all orders for this visit:  Acute bronchitis, unspecified organism  Acute pansinusitis, recurrence not specified  Other orders -     amoxicillin-clavulanate (AUGMENTIN) 875-125 MG tablet; Take 1 tablet by mouth 2 (two) times daily. -     pseudoephedrine-guaifenesin (MUCINEX D) 60-600 MG 12 hr tablet; Take 1 tablet by mouth every 12 (twelve) hours. -     chlorpheniramine-HYDROcodone (TUSSIONEX PENNKINETIC ER) 10-8 MG/5ML SUER; Take 5 mLs by mouth 2 (two) times daily.   I am having Ms. Krenn start on amoxicillin-clavulanate, pseudoephedrine-guaifenesin, and chlorpheniramine-HYDROcodone. I am also having her maintain her goserelin, esomeprazole, fluticasone, letrozole, predniSONE, citalopram, topiramate, and ALPRAZolam.  Meds ordered this encounter  Medications  . amoxicillin-clavulanate (AUGMENTIN) 875-125 MG tablet    Sig: Take 1 tablet by mouth 2 (two) times daily.    Dispense:  20  tablet    Refill:  0  . pseudoephedrine-guaifenesin (MUCINEX D) 60-600 MG 12 hr tablet    Sig: Take 1 tablet by mouth every 12 (twelve) hours.    Dispense:  18 tablet    Refill:  0  . chlorpheniramine-HYDROcodone (TUSSIONEX PENNKINETIC ER) 10-8 MG/5ML SUER    Sig: Take 5 mLs by mouth 2 (two) times daily.    Dispense:  60 mL    Refill:  0    Appropriate red flag conditions were discussed with the patient as well as actions that should be taken.  Patient expressed his understanding.  Follow-up: Return if symptoms worsen or fail to improve.  Roselee Culver, MD

## 2015-03-17 NOTE — Patient Instructions (Signed)

## 2015-03-19 ENCOUNTER — Other Ambulatory Visit: Payer: Self-pay | Admitting: Family Medicine

## 2015-04-02 ENCOUNTER — Encounter: Payer: Self-pay | Admitting: Gastroenterology

## 2015-04-18 ENCOUNTER — Ambulatory Visit (INDEPENDENT_AMBULATORY_CARE_PROVIDER_SITE_OTHER): Payer: Medicare Other | Admitting: Physician Assistant

## 2015-04-18 ENCOUNTER — Ambulatory Visit (INDEPENDENT_AMBULATORY_CARE_PROVIDER_SITE_OTHER): Payer: Medicare Other

## 2015-04-18 VITALS — BP 112/80 | HR 96 | Temp 98.0°F | Resp 19 | Wt 125.2 lb

## 2015-04-18 DIAGNOSIS — M25561 Pain in right knee: Secondary | ICD-10-CM

## 2015-04-18 DIAGNOSIS — S8391XA Sprain of unspecified site of right knee, initial encounter: Secondary | ICD-10-CM | POA: Diagnosis not present

## 2015-04-18 MED ORDER — HYDROCODONE-ACETAMINOPHEN 5-325 MG PO TABS: 1.0000 | ORAL_TABLET | Freq: Four times a day (QID) | ORAL | Status: DC | PRN

## 2015-04-18 MED ORDER — MELOXICAM 15 MG PO TABS: 15.0000 mg | ORAL_TABLET | Freq: Every day | ORAL | Status: DC

## 2015-04-18 NOTE — Progress Notes (Signed)
[facility] [address] [place] [address] [phone]  Date:  04/18/2015   Name:  Crystal [name]   DOB:  February 03, 1976   MRN:  [id number]  PCP:  Lamar Blinks, MD    Chief Complaint: Knee Injury   History of Present Illness:  This is a 39 y.o. female with PMH anorexia nervosa, breast cancer, hx DVT who is presenting with a right knee injury.  She states for a while now every time she bends down her knee pops. Yesterday she been down and her knee popped. When she stood up she had intense shooting pain that extended from her knee anteriorly down to her foot. She later when to step up into her husband's truck and got shooting pain up into her right hip. She is unable to fully extend her knee. She unable to ambulate without limping. She denies swelling, redness, bruising, calf pain, paresthesias. Only prior fractures are right great toe and nose. Never had knee injury before. She has had a DVT upper extremity before from her port while being treated for breast cancer in 2012, no problems since. She was on prednisone to stimulate her appetite but discontinued this 3 months ago.  She states she is 100% sure she is not pregnant. She is not sexually active.  Review of Systems:  Review of Systems See HPI  Patient Active Problem List   Diagnosis Date Noted  . Anorexia nervosa 08/19/2014  . Back pain 08/19/2014  . Malnutrition, calorie ([place]) 12/03/2013  . Migraines 05/21/2013  . Breast cancer of upper-outer quadrant of right female breast ([place]) 03/21/2013  . Chronic pain 01/09/2013  . DVT of upper extremity (deep vein thrombosis) ([place]) 01/02/2013  . Arm edema 12/27/2012  . Neuropathic pain 12/27/2012  . Chronic female pelvic pain 08/21/2012  . Fatigue 08/20/2012  . Chemotherapy induced cardiomyopathy ([place]) 09/08/2011  . Generalized convulsive seizure ([place]) 07/13/2011  . Seizure disorder ([place]) 07/13/2011  . Migraine 07/13/2011  . Anxiety disorder 07/13/2011   . Depression 07/13/2011  . Hx MRSA infection 04/13/2011    Prior to Admission medications   Medication Sig Start Date End Date Taking? Authorizing Provider  ALPRAZolam (XANAX) 1 MG tablet TAKE 1 TABLET BY MOUTH 3 TIMES A DAY AS NEEDED FOR ANXIETY 03/16/15  Yes Jessica C Copland, MD  Brexpiprazole (REXULTI) 1 MG TABS Take by mouth.   Yes Historical Provider, MD  esomeprazole (NEXIUM) 20 MG capsule Take 20 mg by mouth daily at 12 noon.   Yes Historical Provider, MD  fluticasone (FLONASE) 50 MCG/ACT nasal spray Place 2 sprays into both nostrils daily. Patient taking differently: Place 2 sprays into both nostrils as needed.  06/09/14  Yes [name], MD  goserelin (ZOLADEX) 10.8 MG injection Inject 10.8 mg into the skin every 3 (three) months.   Yes Historical Provider, MD  letrozole (FEMARA) 2.5 MG tablet Take 1 tablet (2.5 mg total) by mouth daily. 06/18/14  Yes Chauncey Cruel, MD  topiramate (TOPAMAX) 50 MG tablet Take 1-2 tablets (50-100 mg total) by mouth 2 (two) times daily. 02/23/15  Yes Penni Bombard, MD                  Allergies  Allergen Reactions  . Tramadol Other (See Comments)    Seizures  . Phenytoin Nausea And Vomiting  . Thorazine [Chlorpromazine Hcl] Hives  . Ciprofloxacin Nausea And Vomiting  . Vancomycin Rash    Possible rash reported by MD  Past Surgical History  Procedure Laterality Date  . Wisdom tooth extraction    . Biopsy breast  10/14/2010    right needle core biopsy  . Portacath placement  11/16/2010    placement of left subclavian port  . I&d extremity  09/26/2011    Procedure: IRRIGATION AND DEBRIDEMENT EXTREMITY;  Surgeon: Tennis Must, MD;  Location: [facility];  Service: Orthopedics;  Laterality: Right;  I&D right hand cat bite wound  . Breast surgery  11/16/2010    bilateral mastectomy+ right axillary node dissection,T1cN1a, Her2+,ERPR+  (right breast invasive ductal carcinoma)  . Transthoracic echocardiogram  05-28-2012    LVF NORMAL/  EF  55-60%  . Cysto with hydrodistension N/A 07/23/2012    Procedure: CYSTOSCOPY/HYDRODISTENSION instillation of marcaine and pyridium;  Surgeon: Reece Packer, MD;  Location: [facility];  Service: Urology;  Laterality: N/A;  INSTILLATION      Social History  Substance Use Topics  . Smoking status: Current Every Day Smoker -- 1.00 packs/day for 20 years    Types: Cigarettes  . Smokeless tobacco: Never Used  . Alcohol Use: Yes     Comment: 12/18/14 sporadically, socially    Family History  Problem Relation Age of Onset  . Cancer Maternal Grandfather   . Mental illness Brother   . Mental illness Son     Medication list has been reviewed and updated.  Physical Examination:  Physical Exam  Constitutional: She is oriented to person, place, and time. She appears well-developed and well-nourished. No distress.  HENT:  Head: Normocephalic and atraumatic.  Right Ear: Hearing normal.  Left Ear: Hearing normal.  Nose: Nose normal.  Eyes: Conjunctivae and lids are normal. Right eye exhibits no discharge. Left eye exhibits no discharge. No scleral icterus.  Cardiovascular: Normal rate, regular rhythm and normal pulses.   Pulmonary/Chest: Effort normal. No respiratory distress.  Musculoskeletal:       Right knee: She exhibits decreased range of motion (unwilling to fully flex or fully extend knee due to pain). She exhibits no swelling, no effusion, no ecchymosis, no deformity and no erythema.       Left knee: Normal.  Pain over patellar tendon, medial joint line, MCL. No popliteal space tenderness. No calf tenderness or swelling. Full ROM of right hip. No tenderness over lateral or anterior hip. Pedal pulses intact. Sensation intact. Mcmurray negative Lachman negative.  Neurological: She is alert and oriented to person, place, and time. Gait (antalgic) abnormal.  Skin: Skin is warm, dry and intact. No lesion and no rash noted.  Psychiatric: She has a normal mood and  affect. Her speech is normal and behavior is normal. Thought content normal.   BP 112/80 mmHg  Pulse 96  Temp(Src) 98 F (36.7 C) (Oral)  Resp 19  Wt 125 lb 3.2 oz (56.79 kg)  SpO2 98%  [facility] reading (PRIMARY) by  Dr. Tamala Julian: negative.  IMPRESSION: No acute osseous abnormality.  Assessment and Plan:  1. Knee sprain, right, initial encounter 2. Knee pain, right Radiograph negative. Exam only with some tenderness over medial joint line and MCL. McMurray and Lachman negative. Possible knee sprain. Counseled on RICE. Pt requested a few norco for pain. [place] database clear with only alprazolam being filled. Gave mobic and may use norco for pain not controlled by mobic. Crutches provided. Return if symptoms not improved in 2 weeks and would consider referral to ortho. - meloxicam (MOBIC) 15 MG tablet; Take 1 tablet (15 mg total) by mouth daily.  Dispense: 30 tablet; Refill: 0 - HYDROcodone-acetaminophen (NORCO) 5-325 MG tablet; Take 1 tablet by mouth every 6 (six) hours as needed.  Dispense: 15 tablet; Refill: 0 - DG Knee Complete 4 Views Right; Future   Benjaman Pott. Drenda Freeze, MHS [facility]  04/18/2015

## 2015-04-18 NOTE — Patient Instructions (Signed)
You can take mobic once a day for pain/inflammation. Do not use mobic with other products containing ibuprofen, naprosyn or aspirin. You may use hydrocodone for pain not controlled by mobic. Stay off your knee for the next 1 week. You may start to bear weight again in 1 week. If you are unable to bear weight in 1 week or if your pain is not improving in 2 weeks, return. Will refer you to ortho at that time. Alternate ice with heat  RICE for Routine Care of Injuries Theroutine careofmanyinjuriesincludes rest, ice, compression, and elevation (RICE therapy). RICE therapy is often recommended for injuries to soft tissues, such as a muscle strain, ligament injuries, bruises, and overuse injuries. It can also be used for some bony injuries. Using RICE therapy can help to relieve pain, lessen swelling, and enable your body to heal. Rest Rest is required to allow your body to heal. This usually involves reducing your normal activities and avoiding use of the injured part of your body. Generally, you can return to your normal activities when you are comfortable and have been given permission by your health care provider. Ice Icing your injury helps to keep the swelling down, and it lessens pain. Do not apply ice directly to your skin.  Put ice in a plastic bag.  Place a towel between your skin and the bag.  Leave the ice on for 20 minutes, 2-3 times a day. Do this for as long as you are directed by your health care provider. Compression Compression means putting pressure on the injured area. Compression helps to keep swelling down, gives support, and helps with discomfort. Compression may be done with an elastic bandage. If an elastic bandage has been applied, follow these general tips:  Remove and reapply the bandage every 3-4 hours or as directed by your health care provider.  Make sure the bandage is not wrapped too tightly, because this can cut off circulation. If part of your body beyond the  bandage becomes blue, numb, cold, swollen, or more painful, your bandage is most likely too tight. If this occurs, remove your bandage and reapply it more loosely.  See your health care provider if the bandage seems to be making your problems worse rather than better. Elevation Elevation means keeping the injured area raised. This helps to lessen swelling and decrease pain. If possible, your injured area should be elevated at or above the level of your heart or the center of your chest. [place]? You should seek medical care if:  Your pain and swelling continue.  Your symptoms are getting worse rather than improving. These symptoms may indicate that further evaluation or further X-rays are needed. Sometimes, X-rays may not show a small broken bone (fracture) until a number of days later. Make a follow-up appointment with your health care provider. WHEN SHOULD I SEEK IMMEDIATE MEDICAL CARE? You should seek immediate medical care if:  You have sudden severe pain at or below the area of your injury.  You have redness or increased swelling around your injury.  You have tingling or numbness at or below the area of your injury that does not improve after you remove the elastic bandage.   This information is not intended to replace advice given to you by your health care provider. Make sure you discuss any questions you have with your health care provider.   Document Released: 07/31/2000 Document Revised: 01/07/2015 Document Reviewed: 03/26/2014 Elsevier Interactive Patient Education [employer].

## 2015-05-05 ENCOUNTER — Ambulatory Visit (HOSPITAL_BASED_OUTPATIENT_CLINIC_OR_DEPARTMENT_OTHER): Payer: Medicare Other | Admitting: Oncology

## 2015-05-05 ENCOUNTER — Telehealth: Payer: Self-pay | Admitting: Oncology

## 2015-05-05 ENCOUNTER — Ambulatory Visit (HOSPITAL_BASED_OUTPATIENT_CLINIC_OR_DEPARTMENT_OTHER): Payer: Medicare Other

## 2015-05-05 ENCOUNTER — Other Ambulatory Visit (HOSPITAL_BASED_OUTPATIENT_CLINIC_OR_DEPARTMENT_OTHER): Payer: Medicare Other

## 2015-05-05 VITALS — BP 137/88 | HR 107 | Temp 97.9°F | Resp 19 | Wt 122.9 lb

## 2015-05-05 DIAGNOSIS — C50411 Malignant neoplasm of upper-outer quadrant of right female breast: Secondary | ICD-10-CM

## 2015-05-05 DIAGNOSIS — Z170 Estrogen receptor positive status [ER+]: Secondary | ICD-10-CM | POA: Diagnosis not present

## 2015-05-05 DIAGNOSIS — Z79811 Long term (current) use of aromatase inhibitors: Secondary | ICD-10-CM | POA: Diagnosis not present

## 2015-05-05 DIAGNOSIS — I82623 Acute embolism and thrombosis of deep veins of upper extremity, bilateral: Secondary | ICD-10-CM

## 2015-05-05 DIAGNOSIS — Z5111 Encounter for antineoplastic chemotherapy: Secondary | ICD-10-CM

## 2015-05-05 LAB — CBC WITH DIFFERENTIAL/PLATELET
BASO%: 1.1 % (ref 0.0–2.0)
BASOS ABS: 0.1 10*3/uL (ref 0.0–0.1)
EOS%: 6 % (ref 0.0–7.0)
Eosinophils Absolute: 0.4 10*3/uL (ref 0.0–0.5)
HEMATOCRIT: 46.8 % — AB (ref 34.8–46.6)
HGB: 15.8 g/dL (ref 11.6–15.9)
LYMPH#: 2.1 10*3/uL (ref 0.9–3.3)
LYMPH%: 32.7 % (ref 14.0–49.7)
MCH: 31.6 pg (ref 25.1–34.0)
MCHC: 33.9 g/dL (ref 31.5–36.0)
MCV: 93.3 fL (ref 79.5–101.0)
MONO#: 0.3 10*3/uL (ref 0.1–0.9)
MONO%: 4.8 % (ref 0.0–14.0)
NEUT#: 3.6 10*3/uL (ref 1.5–6.5)
NEUT%: 55.4 % (ref 38.4–76.8)
PLATELETS: 223 10*3/uL (ref 145–400)
RBC: 5.02 10*6/uL (ref 3.70–5.45)
RDW: 12.5 % (ref 11.2–14.5)
WBC: 6.4 10*3/uL (ref 3.9–10.3)

## 2015-05-05 LAB — COMPREHENSIVE METABOLIC PANEL
ALT: <9 U/L (ref 0–55)
ANION GAP: 9 meq/L (ref 3–11)
AST: 18 U/L (ref 5–34)
Albumin: 4.1 g/dL (ref 3.5–5.0)
Alkaline Phosphatase: 101 U/L (ref 40–150)
BUN: 12.6 mg/dL (ref 7.0–26.0)
CALCIUM: 9.4 mg/dL (ref 8.4–10.4)
CHLORIDE: 111 meq/L — AB (ref 98–109)
CO2: 21 mEq/L — ABNORMAL LOW (ref 22–29)
Creatinine: 0.9 mg/dL (ref 0.6–1.1)
EGFR: 84 mL/min/{1.73_m2} — AB (ref >90)
Glucose: 96 mg/dl (ref 70–140)
POTASSIUM: 3.5 meq/L (ref 3.5–5.1)
Sodium: 141 mEq/L (ref 136–145)
Total Bilirubin: 0.32 mg/dL (ref 0.20–1.20)
Total Protein: 6.8 g/dL (ref 6.4–8.3)

## 2015-05-05 MED ORDER — LETROZOLE 2.5 MG PO TABS
2.5000 mg | ORAL_TABLET | Freq: Every day | ORAL | Status: DC
Start: 2015-05-05 — End: 2016-05-13

## 2015-05-05 MED ORDER — GOSERELIN ACETATE 10.8 MG ~~LOC~~ IMPL
10.8000 mg | DRUG_IMPLANT | SUBCUTANEOUS | Status: DC
  Administered 2015-05-05: 10.8 mg via SUBCUTANEOUS
  Filled 2015-05-05: qty 10.8

## 2015-05-05 NOTE — Telephone Encounter (Signed)
Appointments made and avs printed for patient,dr [facility] office will call for appointment

## 2015-05-05 NOTE — Progress Notes (Signed)
ID: Crystal [name]   DOB: 21-Jul-1975  MR#: [id number]  SJG#:[id number]  MLY:[id number],FKCLEXN, MD GYN: SULorine Bears, MD OTHER:  Crystal [name], Crystal [name], Crystal [name]   CHIEF COMPLAINT:  Hx of Right Breast Cancer  CURRENT THERAPY: Goserelin,  letrozole   BREAST CANCER HISTORY: From the initial intake note:  "The patient is a 40 year old Crystal [name] woman who noted a mass in her right breast and brought it to her primary [name] physician's attention June of 2012. She was set up for mammography at [facility], where an area of calcifications highly suspicious for carcinoma was biopsied. This was in the upper outer quadrant and it measured 1.7 cm mammographically and 1.8 cm by ultrasonography. The pathology report ([id number]) showed a high-grade invasive ductal carcinoma estrogen receptor positive at 64% progesterone receptor positive at 18%, and HER-2 amplified with a ratio by CISH of 5.24. MIB-1-1 was 65%. A second mass biopsied at the same time was also triple positive.  Given her multiple masses in the right breast, mastectomy was recommended. The patient actually opted for bilateral mastectomies and this was performed together with a right axillary lymph node dissection under Dr. Star Age on 11/16/2010. The final pathology [id number]) showed, on the left, no evidence of cancer. On the right the patient had a high-grade invasive ductal carcinoma measuring 1.9 cm, with negative margins, grade 3, involving 2 of 16 lymph nodes (stage IIB)."  Her subsequent history is as detailed below.  INTERVAL HISTORY:  Crystal [name] returns today for followup of her estrogen receptor positive breast cancer. She continues on goserelin monthly, which she tolerates well. She is also on letrozole. Vaginal dryness and loss of libido are real problems but she and her husband call are managing. She also has fibromyalgia-like symptoms, with achy joints "all over".  After the last visit here she went on a brief course of  steroids which helped her appetite but more importantly she met with Crystal [name], and nutrition is that she found very helpful, and also established herself at [facility]. She tells me she really likes her counselor and her psychiatrist there. "I haven't felt this good in years".  REVIEW OF SYSTEMS: She feels fatiguedbut that is a bit improved. She continues to have joint pains here and there, and these, and go. They're usually present to some extent. Her anxiety and depression are "much improved". A detailed review of systems today was otherwise stable  PAST MEDICAL HISTORY: Past Medical History  Diagnosis Date  . Status post chemotherapy     docetaxel/carboplatin/trastuzumab  . Anxiety   . Hx MRSA infection 03/28/11    Skin  . History of breast cancer DX JUNE 2012 W/ RIGHT BREAST INVASIVE DUCTAL CA  STAGE IIB---  S/P BILATERAL MASTECTOMIES AND RIGHT NODE DISSECTION    ONCOLOGIST-- DR Jana Hakim--  CHEMO ENDED [place] 2012 / RADIATION ENDED MAR 2013--  CURRENT ON TAMOXIFEN AND ZOLADEX  . Family history of anesthesia complication     MOTHER PONV  . Hot flashes due to tamoxifen   . History of idiopathic seizure     DX 2008 --  LAST ONE 2012--  NO ISSUES SINCE AND NO MEDS. -- PT STATES DOCTORS FELT IT WAS STRESS RELATED DUE TO HEALTH ISSUES  . Bipolar affective disorder, mixed ([place])   . GERD (gastroesophageal reflux disease)     OCCASIONALLY TAKE ZANTAC  . Left ovarian cyst   . Right lower quadrant abdominal pain   . Frequency of urination   . Urgency  of urination   . Strains to urinate   . Nocturia   . Heart palpitations   . Cancer ([place])   . Depression   . Seizures ([place])   1. History of seizures. The patient has been thoroughly evaluated both here by Dr. Gaynell Face and at [facility] for this. Among many studies, she had an MRI of the brain on August 28th. This showed an incidental solitary small lesion in the left frontoparietal white matter. It was not felt to be suggestive of  multiple sclerosis. A noncontrasted CT in August 2010 was negative. The patient has been off all seizure medications now for 2 months. There has been no evidence of seizure recurrence. 2. Complex psychology history (please refer to Dr. Iona Coach note in the E-chart from August 29, 2009) with evidence of anxiety disorder, depression and psychosis. The patient is currently on no medications for this and denies any of these symptoms at present. 3. History of migraines. 4. Remote history of multidrug abuse. 5. Status post wisdom teeth extraction.         History of MRSA skin infection   PAST SURGICAL HISTORY: Past Surgical History  Procedure Laterality Date  . Wisdom tooth extraction    . Biopsy breast  10/14/2010    right needle core biopsy  . Portacath placement  11/16/2010    placement of left subclavian port  . I&d extremity  09/26/2011    Procedure: IRRIGATION AND DEBRIDEMENT EXTREMITY;  Surgeon: Tennis Must, MD;  Location: [facility];  Service: Orthopedics;  Laterality: Right;  I&D right hand cat bite wound  . Breast surgery  11/16/2010    bilateral mastectomy+ right axillary node dissection,T1cN1a, Her2+,ERPR+  (right breast invasive ductal carcinoma)  . Transthoracic echocardiogram  05-28-2012    LVF NORMAL/  EF 55-60%  . Cysto with hydrodistension N/A 07/23/2012    Procedure: CYSTOSCOPY/HYDRODISTENSION instillation of marcaine and pyridium;  Surgeon: Reece Packer, MD;  Location: [facility];  Service: Urology;  Laterality: N/A;  INSTILLATION      FAMILY HISTORY Family History  Problem Relation Age of Onset  . Cancer Maternal Grandfather   . Mental illness Brother   . Mental illness Son   The patient's mother is living, in her mid 106s, with no history of breast or ovarian cancer. The patient does not know her biological father. The patient has an older brother with bipolar disease, and according to the patient, borderline schizophrenia.   GYNECOLOGIC HISTORY:  (Updated January 2015) Menarche at age 10. She is G1, P1 with first pregnancy to term at age 86. She was not menopausal, and was still having regular periods at the time of her breast cancer diagnosis. She continues on every three-month goserelin.  SOCIAL HISTORY:  (Updated 05/21/2013) She used to work in a [employer] but had to leave that job because of the seizure problem. She's currently unemployed. Her husband , Glendell Docker, works as a Development worker, international aid. Ameli has a son, Liane Comber, who also lives in [place]. [name], according to the patient, has significant nausea problems. The patient attends a local [place]. Peggyann Juba is pastor.    ADVANCED DIRECTIVES: Not in place  HEALTH MAINTENANCE:  (Updated 05/21/2013) Social History  Substance Use Topics  . Smoking status: Current Every Day Smoker -- 1.00 packs/day for 20 years    Types: Cigarettes  . Smokeless tobacco: Never Used  . Alcohol Use: Yes     Comment: 12/18/14 sporadically, socially     Colonoscopy: Never  PAP: July 2012  Bone density:  Never  Lipid panel: Not on file  Allergies  Allergen Reactions  . Tramadol Other (See Comments)    Seizures  . Phenytoin Nausea And Vomiting  . Thorazine [Chlorpromazine Hcl] Hives  . Ciprofloxacin Nausea And Vomiting  . Vancomycin Rash    Possible rash reported by MD    Current Outpatient Prescriptions  Medication Sig Dispense Refill  . ALPRAZolam (XANAX) 1 MG tablet TAKE 1 TABLET BY MOUTH 3 TIMES A DAY AS NEEDED FOR ANXIETY 90 tablet 1  . Brexpiprazole (REXULTI) 1 MG TABS Take by mouth.    . citalopram (CELEXA) 40 MG tablet Take 1 tablet (40 mg total) by mouth daily. (Patient not taking: Reported on 04/18/2015) 90 tablet 1  . esomeprazole (NEXIUM) 20 MG capsule Take 20 mg by mouth daily at 12 noon.    . fluticasone (FLONASE) 50 MCG/ACT nasal spray Place 2 sprays into both nostrils daily. (Patient taking differently: Place 2 sprays into both nostrils as needed. ) 16 g 6  .  goserelin (ZOLADEX) 10.8 MG injection Inject 10.8 mg into the skin every 3 (three) months.    Marland Kitchen HYDROcodone-acetaminophen (NORCO) 5-325 MG tablet Take 1 tablet by mouth every 6 (six) hours as needed. 15 tablet 0  . letrozole (FEMARA) 2.5 MG tablet Take 1 tablet (2.5 mg total) by mouth daily. 90 tablet 4  . meloxicam (MOBIC) 15 MG tablet Take 1 tablet (15 mg total) by mouth daily. 30 tablet 0  . topiramate (TOPAMAX) 50 MG tablet Take 1-2 tablets (50-100 mg total) by mouth 2 (two) times daily. 120 tablet 12   No current facility-administered medications for this visit.   OBJECTIVE: Young white woman who appears well Filed Vitals:   05/05/15 1355  BP: 137/88  Pulse: 107  Temp: 97.9 F (36.6 C)  Resp: 19  ECOG: 2 Body mass index is 20.45 kg/(m^2). Filed Weights   05/05/15 1355  Weight: 122 lb 14.4 oz (55.747 kg)    Sclerae unicteric, pupils round and equal Oropharynx clear and moist-- no thrush or other lesions No cervical or supraclavicular adenopathy Lungs no rales or rhonchi Heart regular rate and rhythm Abd soft, nontender, positive bowel sounds MSK no focal spinal tenderness, no upper extremity lymphedema Neuro: nonfocal, well oriented, appropriate affect Breasts: status post bilateral mastectomies. There is no evidence of chest wall recurrence.   LAB RESULTS: Lab Results  Component Value Date   WBC 6.4 05/05/2015   NEUTROABS 3.6 05/05/2015   HGB 15.8 05/05/2015   HCT 46.8* 05/05/2015   MCV 93.3 05/05/2015   PLT 223 05/05/2015      Chemistry      Component Value Date/Time   NA 140 01/28/2015 1243   NA 138 08/22/2012 1700   K 3.6 01/28/2015 1243   K 3.7 08/22/2012 1700   CL 105 10/11/2012 1441   CL 103 08/22/2012 1700   CO2 19* 01/28/2015 1243   CO2 23 08/22/2012 1700   BUN 9.5 01/28/2015 1243   BUN 11 08/22/2012 1700   CREATININE 0.8 01/28/2015 1243   CREATININE 0.96 08/22/2012 1700      Component Value Date/Time   CALCIUM 9.5 01/28/2015 1243   CALCIUM  9.1 08/22/2012 1700   ALKPHOS 93 01/28/2015 1243   ALKPHOS 87 08/22/2012 1700   AST 21 01/28/2015 1243   AST 33 08/22/2012 1700   ALT 16 01/28/2015 1243   ALT 24 08/22/2012 1700   BILITOT 0.55 01/28/2015 1243   BILITOT  0.2* 08/22/2012 1700         STUDIES: Dg Knee Complete 4 Views Right  04/18/2015  CLINICAL DATA:  Patient with right knee pain and popping for 2 years. Initial encounter. EXAM: RIGHT KNEE - COMPLETE 4+ VIEW COMPARISON:  None. FINDINGS: Normal anatomic alignment. No evidence for acute fracture or dislocation. Regional soft tissues are unremarkable. IMPRESSION: No acute osseous abnormality. Electronically Signed   By: Lovey Newcomer M.D.   On: 04/18/2015 09:40     ASSESSMENT: 40 y.o.  [place] woman, known to be BRCA1 and 2 negative,   (1) status post bilateral mastectomies May of 2012 for a right-sided T1c N1 (stage II) grade 3 invasive ductal carcinoma,estrogen and progesterone receptor positive, HER-2 amplified, with an MIB-1 of 85%.   (2) status post carboplatin/ docetaxol/ trastuzumab x6, completed mid December 2012   (3) on trastuzumab every 3 weeks, started August of 2012, interrupted between April and August of 2013, completed 04/27/2012. Most recent echo 08/20/2012  (4) status post radiation therapy, completed 07/12/2011  (5) On tamoxifen starting April 2013, held as of 12/27/2012 with the diagnosis of a left upper extremity DVT.  On therapeutic Coumadin until  May 2015 when port removed and coumadin discontinued  (6) On goserelin given every 3 months, 1st injection on 10/26/11  (7) started on anastrozole November 2014, discontinued January 2015 because of side effects  (8) started letrozole August 2015  Other problems:  (1) Chronic Pain/ fibromyalgia  (2) history of seizure disorder   (3) history of severe migraines   (4)  Severe proteincalorie malnutrition: resolved   PLAN:  Manasvi is doing the best that I have seen her in a very long time.  Her weight is back up pretty close to her ideal body weight at this point. She is on a good medication regimen as far as her migraines, seizures, and psychiatric issues are concerned.  The plan is to continue the letrozole and goserelin another 3 years. I wish I could tell her that at that point her libido will improve but antidepressants also affect libido so this may end up being a chronic problem.  She has not had a Pap smear in quite some time. I have put in a referral to gynecology to bring her up-to-date from that point of view.  Otherwise she will return to see me in October. She knows to call for any problems that may develop before that visit.     05/05/2015     Chauncey Cruel, MD

## 2015-05-09 ENCOUNTER — Other Ambulatory Visit: Payer: Self-pay | Admitting: Oncology

## 2015-05-12 ENCOUNTER — Ambulatory Visit (INDEPENDENT_AMBULATORY_CARE_PROVIDER_SITE_OTHER): Payer: Medicare Other | Admitting: Physician Assistant

## 2015-05-12 ENCOUNTER — Encounter: Payer: Self-pay | Admitting: Physician Assistant

## 2015-05-12 VITALS — BP 111/77 | HR 104 | Temp 98.7°F | Resp 16 | Ht 65.0 in | Wt 120.5 lb

## 2015-05-12 DIAGNOSIS — F419 Anxiety disorder, unspecified: Secondary | ICD-10-CM

## 2015-05-12 DIAGNOSIS — S8391XD Sprain of unspecified site of right knee, subsequent encounter: Secondary | ICD-10-CM

## 2015-05-12 MED ORDER — ALPRAZOLAM 1 MG PO TABS: ORAL_TABLET | ORAL | Status: DC

## 2015-05-12 NOTE — Progress Notes (Signed)
[facility] [address], [place] [address] [phone]  Date:  05/12/2015   Name:  Crystal [name]   DOB:  01-14-1976   MRN:  [id number]  PCP:  Lamar Blinks, MD    Chief Complaint: Follow-up and Medication Refill   History of Present Illness:  This is a 40 y.o. female with PMH breast cancer, DVT upper extremity, seizures, anxiety, depression, anorexia nervosa who is presenting for follow up right knee pain. She was seen here 12/17 after a knee injury. She bent down and her knee popped. When she stood up she had intense shooting pain that extended from her knee down to her foot and up to her hip. Exam with pain over patellar tendon, medial joint line and MCL. McMurray and Lachman negative. Radiograph negative. Treated for possible knee sprain with mobic and norco. Crutches were provided. Pt returning for follow up. She states when she made the appt she was having more pain, but pain has improved a lot over the past week. She gets pain in her medial knee usually as day goes on. She is no longer limping unless pain is pain. She is still getting occ shooting pain down her leg and up her thigh. This is becoming more infrequent. She states the mobic didn't help her much but she has found tylenol to be helpful. She denies weakness, paresthesias, catching sensation.  Pt needing refill of xanax. She is due for refill on 1/14. She still has pills to last her until then, but wanted to get script today since she is here. She is a patient of Dr. Lillie Fragmin, last saw her 08/2014. She states she has stress induced seizures. She has been on xanax daily since 2008. Her dose was increased when she got her cancer diagnosis. She takes 1 mg TID.  Review of Systems:  Review of Systems See HPI  Patient Active Problem List   Diagnosis Date Noted  . Anorexia nervosa 08/19/2014  . Back pain 08/19/2014  . Malnutrition, calorie ([place]) 12/03/2013  . Migraines 05/21/2013  . Breast cancer of  upper-outer quadrant of right female breast ([place]) 03/21/2013  . Chronic pain 01/09/2013  . DVT of upper extremity (deep vein thrombosis) ([place]) 01/02/2013  . Arm edema 12/27/2012  . Neuropathic pain 12/27/2012  . Chronic female pelvic pain 08/21/2012  . Fatigue 08/20/2012  . Chemotherapy induced cardiomyopathy ([place]) 09/08/2011  . Generalized convulsive seizure ([place]) 07/13/2011  . Seizure disorder ([place]) 07/13/2011  . Migraine 07/13/2011  . Anxiety disorder 07/13/2011  . Depression 07/13/2011  . Hx MRSA infection 04/13/2011    Prior to Admission medications   Medication Sig Start Date End Date Taking? Authorizing Provider  ALPRAZolam (XANAX) 1 MG tablet TAKE 1 TABLET BY MOUTH 3 TIMES A DAY AS NEEDED FOR ANXIETY 03/16/15   Gay Filler Copland, MD  Brexpiprazole (REXULTI) 1 MG TABS Take by mouth.    Historical Provider, MD  esomeprazole (NEXIUM) 20 MG capsule Take 20 mg by mouth daily at 12 noon.    Historical Provider, MD  fluticasone (FLONASE) 50 MCG/ACT nasal spray Place 2 sprays into both nostrils daily. Patient taking differently: Place 2 sprays into both nostrils as needed.  06/09/14   [name], MD  goserelin (ZOLADEX) 10.8 MG injection Inject 10.8 mg into the skin every 3 (three) months.    Historical Provider, MD  letrozole (FEMARA) 2.5 MG tablet Take 1 tablet (2.5 mg total) by mouth daily. 05/05/15   Chauncey Cruel, MD  topiramate (TOPAMAX) 50 MG tablet Take 1-2 tablets (50-100 mg total) by mouth 2 (two) times daily. 02/23/15   Penni Bombard, MD    Allergies  Allergen Reactions  . Tramadol Other (See Comments)    Seizures  . Phenytoin Nausea And Vomiting  . Thorazine [Chlorpromazine Hcl] Hives  . Ciprofloxacin Nausea And Vomiting  . Vancomycin Rash    Possible rash reported by MD    Past Surgical History  Procedure Laterality Date  . Wisdom tooth extraction    . Biopsy breast  10/14/2010    right needle core biopsy  . Portacath placement  11/16/2010    placement  of left subclavian port  . I&d extremity  09/26/2011    Procedure: IRRIGATION AND DEBRIDEMENT EXTREMITY;  Surgeon: Tennis Must, MD;  Location: [facility];  Service: Orthopedics;  Laterality: Right;  I&D right hand cat bite wound  . Breast surgery  11/16/2010    bilateral mastectomy+ right axillary node dissection,T1cN1a, Her2+,ERPR+  (right breast invasive ductal carcinoma)  . Transthoracic echocardiogram  05-28-2012    LVF NORMAL/  EF 55-60%  . Cysto with hydrodistension N/A 07/23/2012    Procedure: CYSTOSCOPY/HYDRODISTENSION instillation of marcaine and pyridium;  Surgeon: Reece Packer, MD;  Location: [facility];  Service: Urology;  Laterality: N/A;  INSTILLATION      Social History  Substance Use Topics  . Smoking status: Current Every Day Smoker -- 1.00 packs/day for 20 years    Types: Cigarettes  . Smokeless tobacco: Never Used  . Alcohol Use: Yes     Comment: 12/18/14 sporadically, socially    Family History  Problem Relation Age of Onset  . Cancer Maternal Grandfather   . Mental illness Brother   . Mental illness Son     Medication list has been reviewed and updated.  Physical Examination:  Physical Exam  Constitutional: She is oriented to person, place, and time. She appears well-developed and well-nourished. No distress.  HENT:  Head: Normocephalic and atraumatic.  Right Ear: Hearing normal.  Left Ear: Hearing normal.  Nose: Nose normal.  Eyes: Conjunctivae and lids are normal. Right eye exhibits no discharge. Left eye exhibits no discharge. No scleral icterus.  Pulmonary/Chest: Effort normal. No respiratory distress.  Musculoskeletal: Normal range of motion.       Right knee: She exhibits normal range of motion, no swelling, no ecchymosis, no erythema, no LCL laxity and normal patellar mobility. Tenderness found. Medial joint line and patellar tendon tenderness noted. No lateral joint line, no MCL and no LCL tenderness noted.       Left knee:  Normal.  mcmurray negative lachmans negative Pedal pulses intact Sensation intact.  Neurological: She is alert and oriented to person, place, and time.  Skin: Skin is warm, dry and intact. No lesion and no rash noted.  Psychiatric: She has a normal mood and affect. Her speech is normal and behavior is normal. Thought content normal.   BP 111/77 mmHg  Pulse 104  Temp(Src) 98.7 F (37.1 C) (Oral)  Resp 16  Ht 5' 5" (1.651 m)  Wt 120 lb 8 oz (54.658 kg)  BMI 20.05 kg/m2  SpO2 98%  Assessment and Plan:  1. Knee sprain, right, subsequent encounter Much improved - 80% improvement. Still with some medial knee pain. McMurray and Lachman negative. Still suspect knee sprain. Continue tylenol and mobic as needed. If does not continue to improve in 1 month, call and I will refer to ortho.  2. Anxiety Refilled  xanax. Advised she would need to see Dr. Lorelei Pont before more refills could be provided. - ALPRAZolam (XANAX) 1 MG tablet; TAKE 1 TABLET BY MOUTH 3 TIMES A DAY AS NEEDED FOR ANXIETY  Dispense: 90 tablet; Refill: 1   Shirleymae Hauth V. Drenda Freeze, MHS [facility]  05/12/2015

## 2015-05-12 NOTE — Progress Notes (Signed)
She has only has one injection site.    Crystal [name]

## 2015-05-12 NOTE — Patient Instructions (Signed)
Continue mobic and tylenol as needed for pain. If your symptoms do not continue to improve in 1 month, give me a call, and I will refer you to ortho. I've given you two months of xanax. Come back to see Dr. Lorelei Pont before those run out.

## 2015-05-19 ENCOUNTER — Telehealth: Payer: Self-pay | Admitting: Oncology

## 2015-05-19 NOTE — Telephone Encounter (Signed)
Returned patients call as shots should be q 75months per inbox from dr gm,pt aware

## 2015-05-22 ENCOUNTER — Encounter: Payer: Self-pay | Admitting: Family Medicine

## 2015-05-27 ENCOUNTER — Encounter: Payer: Self-pay | Admitting: Family Medicine

## 2015-05-27 ENCOUNTER — Ambulatory Visit: Payer: Medicare Other | Admitting: Diagnostic Neuroimaging

## 2015-06-01 ENCOUNTER — Telehealth: Payer: Self-pay

## 2015-06-01 DIAGNOSIS — S8391XD Sprain of unspecified site of right knee, subsequent encounter: Secondary | ICD-10-CM

## 2015-06-01 NOTE — Telephone Encounter (Signed)
Pt states she was seen for her knee and would like to be referred to an [place]. Please call [phone]

## 2015-06-01 NOTE — Telephone Encounter (Signed)
1. Knee sprain, right, subsequent encounter Much improved - 80% improvement. Still with some medial knee pain. McMurray and Lachman negative. Still suspect knee sprain. Continue tylenol and mobic as needed. If does not continue to improve in 1 month, call and I will refer to ortho.   Referral placed.

## 2015-06-02 ENCOUNTER — Telehealth: Payer: Self-pay

## 2015-06-02 ENCOUNTER — Ambulatory Visit: Payer: Medicare Other

## 2015-06-02 NOTE — Telephone Encounter (Signed)
Pt states the Mobic she was given isn't helping with the pain and would like to have some HYDROCODONE instead. Please call pt at [phone]

## 2015-06-02 NOTE — Telephone Encounter (Signed)
Crystal [name], see message from yesterday, referral was ordered, but not scheduled yet.

## 2015-06-04 NOTE — Telephone Encounter (Signed)
Looks like pt was seen at [facility] by Dr. Mardelle Matte on 06/03/15. They will handle medications for knee pain.

## 2015-06-30 ENCOUNTER — Ambulatory Visit: Payer: Medicare Other

## 2015-07-01 ENCOUNTER — Telehealth: Payer: Self-pay | Admitting: *Deleted

## 2015-07-01 NOTE — Telephone Encounter (Signed)
[facility] called today regarding this pt.  They stated the pt told them that the medication you gave her Xanax 1 mg tablet was not working.  So they changed her medication to Klonopin 3x a day.  They have also alerted the pharmacy to not refill the Xanax.

## 2015-07-28 ENCOUNTER — Ambulatory Visit: Payer: Medicare Other

## 2015-08-04 ENCOUNTER — Ambulatory Visit (HOSPITAL_BASED_OUTPATIENT_CLINIC_OR_DEPARTMENT_OTHER): Payer: Medicare Other

## 2015-08-04 VITALS — BP 106/84 | HR 79 | Temp 98.2°F

## 2015-08-04 DIAGNOSIS — C50411 Malignant neoplasm of upper-outer quadrant of right female breast: Secondary | ICD-10-CM | POA: Diagnosis not present

## 2015-08-04 DIAGNOSIS — I82623 Acute embolism and thrombosis of deep veins of upper extremity, bilateral: Secondary | ICD-10-CM

## 2015-08-04 DIAGNOSIS — Z5111 Encounter for antineoplastic chemotherapy: Secondary | ICD-10-CM | POA: Diagnosis not present

## 2015-08-04 MED ORDER — GOSERELIN ACETATE 10.8 MG ~~LOC~~ IMPL
10.8000 mg | DRUG_IMPLANT | SUBCUTANEOUS | Status: DC
  Administered 2015-08-04: 10.8 mg via SUBCUTANEOUS
  Filled 2015-08-04: qty 10.8

## 2015-08-25 ENCOUNTER — Ambulatory Visit: Payer: Medicare Other

## 2015-09-22 ENCOUNTER — Ambulatory Visit: Payer: Medicare Other

## 2015-10-20 ENCOUNTER — Ambulatory Visit: Payer: Medicare Other

## 2015-10-30 ENCOUNTER — Telehealth: Payer: Self-pay | Admitting: Oncology

## 2015-10-30 NOTE — Telephone Encounter (Signed)
pt cld to r/s injection-pt req time and date on 7/5@3 

## 2015-11-02 ENCOUNTER — Ambulatory Visit: Payer: Medicare Other

## 2015-11-04 ENCOUNTER — Ambulatory Visit (HOSPITAL_BASED_OUTPATIENT_CLINIC_OR_DEPARTMENT_OTHER): Payer: Medicare Other

## 2015-11-04 VITALS — BP 116/83 | HR 67 | Temp 97.9°F | Resp 20

## 2015-11-04 DIAGNOSIS — Z5111 Encounter for antineoplastic chemotherapy: Secondary | ICD-10-CM

## 2015-11-04 DIAGNOSIS — I82623 Acute embolism and thrombosis of deep veins of upper extremity, bilateral: Secondary | ICD-10-CM

## 2015-11-04 DIAGNOSIS — C50411 Malignant neoplasm of upper-outer quadrant of right female breast: Secondary | ICD-10-CM | POA: Diagnosis not present

## 2015-11-04 MED ORDER — GOSERELIN ACETATE 10.8 MG ~~LOC~~ IMPL
10.8000 mg | DRUG_IMPLANT | SUBCUTANEOUS | Status: DC
  Administered 2015-11-04: 10.8 mg via SUBCUTANEOUS
  Filled 2015-11-04: qty 10.8

## 2015-11-04 NOTE — Patient Instructions (Signed)
Goserelin injection What is this medicine? GOSERELIN (GOE se rel in) is similar to a hormone found in the body. It lowers the amount of sex hormones that the body makes. Men will have lower testosterone levels and women will have lower estrogen levels while taking this medicine. In men, this medicine is used to treat prostate cancer; the injection is either given once per month or once every 12 weeks. A once per month injection (only) is used to treat women with endometriosis, dysfunctional uterine bleeding, or advanced breast cancer. This medicine may be used for other purposes; ask your health care provider or pharmacist if you have questions. COMMON BRAND NAME(S): Zoladex What should I tell my health care provider before I take this medicine? They need to know if you have any of these conditions (some only apply to women): -diabetes -heart disease or previous heart attack -high blood pressure -high cholesterol -kidney disease -osteoporosis or low bone density -problems passing urine -spinal cord injury -stroke -tobacco smoker -an unusual or allergic reaction to goserelin, hormone therapy, other medicines, foods, dyes, or preservatives -pregnant or trying to get pregnant -breast-feeding How should I use this medicine? This medicine is for injection under the skin. It is given by a health care professional in a hospital or clinic setting. Men receive this injection once every 4 weeks or once every 12 weeks. Women will only receive the once every 4 weeks injection. Talk to your pediatrician regarding the use of this medicine in children. Special care may be needed. Overdosage: If you think you have taken too much of this medicine contact a poison control center or emergency room at once. NOTE: This medicine is only for you. Do not share this medicine with others. What if I miss a dose? It is important not to miss your dose. Call your doctor or health care professional if you are unable to  keep an appointment. What may interact with this medicine? -female hormones like estrogen -herbal or dietary supplements like black cohosh, chasteberry, or DHEA -female hormones like testosterone -prasterone This list may not describe all possible interactions. Give your health care provider a list of all the medicines, herbs, non-prescription drugs, or dietary supplements you use. Also tell them if you smoke, drink alcohol, or use illegal drugs. Some items may interact with your medicine. What should I watch for while using this medicine? Visit your doctor or health care professional for regular checks on your progress. Your symptoms may appear to get worse during the first weeks of this therapy. Tell your doctor or healthcare professional if your symptoms do not start to get better or if they get worse after this time. Your bones may get weaker if you take this medicine for a long time. If you smoke or frequently drink alcohol you may increase your risk of bone loss. A family history of osteoporosis, chronic use of drugs for seizures (convulsions), or corticosteroids can also increase your risk of bone loss. Talk to your doctor about how to keep your bones strong. This medicine should stop regular monthly menstration in women. Tell your doctor if you continue to menstrate. Women should not become pregnant while taking this medicine or for 12 weeks after stopping this medicine. Women should inform their doctor if they wish to become pregnant or think they might be pregnant. There is a potential for serious side effects to an unborn child. Talk to your health care professional or pharmacist for more information. Do not breast-feed an infant while taking   this medicine. Men should inform their doctors if they wish to father a child. This medicine may lower sperm counts. Talk to your health care professional or pharmacist for more information. What side effects may I notice from receiving this  medicine? Side effects that you should report to your doctor or health care professional as soon as possible: -allergic reactions like skin rash, itching or hives, swelling of the face, lips, or tongue -bone pain -breathing problems -changes in vision -chest pain -feeling faint or lightheaded, falls -fever, chills -pain, swelling, warmth in the leg -pain, tingling, numbness in the hands or feet -signs and symptoms of low blood pressure like dizziness; feeling faint or lightheaded, falls; unusually weak or tired -stomach pain -swelling of the ankles, feet, hands -trouble passing urine or change in the amount of urine -unusually high or low blood pressure -unusually weak or tired Side effects that usually do not require medical attention (report to your doctor or health care professional if they continue or are bothersome): -change in sex drive or performance -changes in breast size in both males and females -changes in emotions or moods -headache -hot flashes -irritation at site where injected -loss of appetite -skin problems like acne, dry skin -vaginal dryness This list may not describe all possible side effects. Call your doctor for medical advice about side effects. You may report side effects to FDA at 1-800-FDA-1088. Where should I keep my medicine? This drug is given in a hospital or clinic and will not be stored at home. NOTE: This sheet is a summary. It may not cover all possible information. If you have questions about this medicine, talk to your doctor, pharmacist, or health care provider.  2015, [employer]. (2013-06-25 11:10:35)  

## 2015-11-13 ENCOUNTER — Encounter: Payer: Self-pay | Admitting: Genetic Counselor

## 2015-11-17 ENCOUNTER — Ambulatory Visit: Payer: Medicare Other

## 2015-12-15 ENCOUNTER — Ambulatory Visit: Payer: Medicare Other

## 2015-12-21 ENCOUNTER — Telehealth: Payer: Self-pay | Admitting: *Deleted

## 2015-12-21 ENCOUNTER — Ambulatory Visit: Payer: Medicare Other | Admitting: Diagnostic Neuroimaging

## 2015-12-21 NOTE — Telephone Encounter (Signed)
LVM informing patient that an 11:30 am appointment time is open at this time. Advised if she would like to come in earlier today to call. Otherwise she will be seen at her currently scheduled 2 pm appointment today. Left name and number.

## 2015-12-22 ENCOUNTER — Encounter: Payer: Self-pay | Admitting: Diagnostic Neuroimaging

## 2016-01-12 ENCOUNTER — Ambulatory Visit: Payer: Medicare Other

## 2016-02-01 ENCOUNTER — Other Ambulatory Visit: Payer: Self-pay | Admitting: *Deleted

## 2016-02-01 DIAGNOSIS — Z171 Estrogen receptor negative status [ER-]: Principal | ICD-10-CM

## 2016-02-01 DIAGNOSIS — C50411 Malignant neoplasm of upper-outer quadrant of right female breast: Secondary | ICD-10-CM

## 2016-02-02 ENCOUNTER — Other Ambulatory Visit (HOSPITAL_BASED_OUTPATIENT_CLINIC_OR_DEPARTMENT_OTHER): Payer: Medicare Other

## 2016-02-02 ENCOUNTER — Ambulatory Visit (HOSPITAL_BASED_OUTPATIENT_CLINIC_OR_DEPARTMENT_OTHER): Payer: Medicare Other | Admitting: Oncology

## 2016-02-02 ENCOUNTER — Ambulatory Visit (HOSPITAL_BASED_OUTPATIENT_CLINIC_OR_DEPARTMENT_OTHER): Payer: Medicare Other

## 2016-02-02 VITALS — BP 103/70 | HR 76 | Temp 98.0°F | Resp 18 | Ht 65.0 in | Wt 124.6 lb

## 2016-02-02 DIAGNOSIS — Z170 Estrogen receptor positive status [ER+]: Secondary | ICD-10-CM

## 2016-02-02 DIAGNOSIS — M792 Neuralgia and neuritis, unspecified: Secondary | ICD-10-CM

## 2016-02-02 DIAGNOSIS — F5000 Anorexia nervosa, unspecified: Secondary | ICD-10-CM

## 2016-02-02 DIAGNOSIS — C50411 Malignant neoplasm of upper-outer quadrant of right female breast: Secondary | ICD-10-CM

## 2016-02-02 DIAGNOSIS — I427 Cardiomyopathy due to drug and external agent: Secondary | ICD-10-CM

## 2016-02-02 DIAGNOSIS — R102 Pelvic and perineal pain: Secondary | ICD-10-CM

## 2016-02-02 DIAGNOSIS — E46 Unspecified protein-calorie malnutrition: Secondary | ICD-10-CM

## 2016-02-02 DIAGNOSIS — R600 Localized edema: Secondary | ICD-10-CM

## 2016-02-02 DIAGNOSIS — C773 Secondary and unspecified malignant neoplasm of axilla and upper limb lymph nodes: Secondary | ICD-10-CM

## 2016-02-02 DIAGNOSIS — R569 Unspecified convulsions: Secondary | ICD-10-CM

## 2016-02-02 DIAGNOSIS — Z5111 Encounter for antineoplastic chemotherapy: Secondary | ICD-10-CM | POA: Diagnosis present

## 2016-02-02 DIAGNOSIS — G8929 Other chronic pain: Secondary | ICD-10-CM

## 2016-02-02 DIAGNOSIS — G40909 Epilepsy, unspecified, not intractable, without status epilepticus: Secondary | ICD-10-CM

## 2016-02-02 DIAGNOSIS — Z8614 Personal history of Methicillin resistant Staphylococcus aureus infection: Secondary | ICD-10-CM

## 2016-02-02 DIAGNOSIS — Z171 Estrogen receptor negative status [ER-]: Principal | ICD-10-CM

## 2016-02-02 DIAGNOSIS — T451X5A Adverse effect of antineoplastic and immunosuppressive drugs, initial encounter: Secondary | ICD-10-CM

## 2016-02-02 LAB — COMPREHENSIVE METABOLIC PANEL
ALBUMIN: 4.2 g/dL (ref 3.5–5.0)
ALK PHOS: 119 U/L (ref 40–150)
ALT: 16 U/L (ref 0–55)
AST: 16 U/L (ref 5–34)
Anion Gap: 11 mEq/L (ref 3–11)
BILIRUBIN TOTAL: 0.54 mg/dL (ref 0.20–1.20)
BUN: 11.8 mg/dL (ref 7.0–26.0)
CO2: 22 meq/L (ref 22–29)
CREATININE: 1 mg/dL (ref 0.6–1.1)
Calcium: 9.7 mg/dL (ref 8.4–10.4)
Chloride: 106 mEq/L (ref 98–109)
EGFR: 75 mL/min/{1.73_m2} — ABNORMAL LOW (ref >90)
GLUCOSE: 95 mg/dL (ref 70–140)
Potassium: 3.7 mEq/L (ref 3.5–5.1)
SODIUM: 140 meq/L (ref 136–145)
TOTAL PROTEIN: 7.1 g/dL (ref 6.4–8.3)

## 2016-02-02 LAB — CBC WITH DIFFERENTIAL/PLATELET
BASO%: 0.9 % (ref 0.0–2.0)
Basophils Absolute: 0 10*3/uL (ref 0.0–0.1)
EOS%: 1.9 % (ref 0.0–7.0)
Eosinophils Absolute: 0.1 10*3/uL (ref 0.0–0.5)
HCT: 47.1 % — ABNORMAL HIGH (ref 34.8–46.6)
HEMOGLOBIN: 16 g/dL — AB (ref 11.6–15.9)
LYMPH%: 27.5 % (ref 14.0–49.7)
MCH: 31.4 pg (ref 25.1–34.0)
MCHC: 34 g/dL (ref 31.5–36.0)
MCV: 92.4 fL (ref 79.5–101.0)
MONO#: 0.2 10*3/uL (ref 0.1–0.9)
MONO%: 4.4 % (ref 0.0–14.0)
NEUT%: 65.3 % (ref 38.4–76.8)
NEUTROS ABS: 3.5 10*3/uL (ref 1.5–6.5)
Platelets: 200 10*3/uL (ref 145–400)
RBC: 5.09 10*6/uL (ref 3.70–5.45)
RDW: 13.1 % (ref 11.2–14.5)
WBC: 5.4 10*3/uL (ref 3.9–10.3)
lymph#: 1.5 10*3/uL (ref 0.9–3.3)

## 2016-02-02 MED ORDER — GOSERELIN ACETATE 10.8 MG ~~LOC~~ IMPL
10.8000 mg | DRUG_IMPLANT | SUBCUTANEOUS | Status: DC
  Administered 2016-02-02: 10.8 mg via SUBCUTANEOUS
  Filled 2016-02-02: qty 10.8

## 2016-02-02 NOTE — Progress Notes (Signed)
ID: Crystal [name]   DOB: [birth year]  MR#: [id number]  OEV#:[id number]  WEX:HBZJIRC,VELFYBO, MD GYN: SULorine Bears, MD OTHER:  Thea Silversmith, Leigh Aurora, Rupinder Toy Care   CHIEF COMPLAINT:  Hx of Right Breast Cancer  CURRENT THERAPY: Goserelin,  letrozole   BREAST CANCER HISTORY: From the initial intake note:  "The patient is a 40 year old Crystal [name] woman who noted a mass in her right breast and brought it to her primary care physician's attention June of 2012. She was set up for mammography at [facility], where an area of calcifications highly suspicious for carcinoma was biopsied. This was in the upper outer quadrant and it measured 1.7 cm mammographically and 1.8 cm by ultrasonography. The pathology report ([id number]) showed a high-grade invasive ductal carcinoma estrogen receptor positive at 64% progesterone receptor positive at 18%, and HER-2 amplified with a ratio by CISH of 5.24. MIB-1-1 was 65%. A second mass biopsied at the same time was also triple positive.  Given her multiple masses in the right breast, mastectomy was recommended. The patient actually opted for bilateral mastectomies and this was performed together with a right axillary lymph node dissection under Dr. Star Age on 11/16/2010. The final pathology [id number]) showed, on the left, no evidence of cancer. On the right the patient had a high-grade invasive ductal carcinoma measuring 1.9 cm, with negative margins, grade 3, involving 2 of 16 lymph nodes (stage IIB)."  Her subsequent history is as detailed below.  INTERVAL HISTORY:  Crystal [name] returns today for followup of her breast cancer. The interval history is generally unremarkable. She tolerates the goserelin every 3 months well. She is also on letrozole, again with good tolerance.  She now feels that she probably did have anorexia and she admits that when she looks at herself in the major she doesn't see what other people C. She feels her current weight, which I think  is terrific, is just a little bit high. She would like to lose "a little" weight, but realizes that this could be dangerous because she would not [employer].  Other items or news is that her husband Glendell Docker has been diagnosed with polycythemia vera. He also has Bell's palsy right now but that seems to be improving. A final change is at [place] mother may be moving in with them  REVIEW OF SYSTEMS: Crystal [name] has been having more hot flashes than usual. She wonders if that's because her ovaries may have finally given up. She also has significant vaginal dryness problems. She has no appetite. Things taste okay. She is thinking of perhaps going back to school to learn to be a phlebotomist or something similar. The good news that she has not had a seizure now in a long time. A detailed review of systems today was otherwise stable  PAST MEDICAL HISTORY: Past Medical History:  Diagnosis Date  . Anxiety   . Bipolar affective disorder, mixed ([place])   . Cancer ([place])   . Depression   . Family history of anesthesia complication    MOTHER PONV  . Frequency of urination   . GERD (gastroesophageal reflux disease)    OCCASIONALLY TAKE ZANTAC  . Heart palpitations   . History of breast cancer DX JUNE 2012 W/ RIGHT BREAST INVASIVE DUCTAL CA  STAGE IIB---  S/P BILATERAL MASTECTOMIES AND RIGHT NODE DISSECTION   ONCOLOGIST-- DR Jana Hakim--  CHEMO ENDED [place] 2012 / RADIATION ENDED MAR 2013--  CURRENT ON TAMOXIFEN AND ZOLADEX  . History of idiopathic seizure    DX 2008 --  LAST ONE 2012--  NO ISSUES SINCE AND NO MEDS. -- PT STATES DOCTORS FELT IT WAS STRESS RELATED DUE TO HEALTH ISSUES  . Hot flashes due to tamoxifen   . Hx MRSA infection 03/28/11   Skin  . Left ovarian cyst   . Nocturia   . Right lower quadrant abdominal pain   . Seizures ([place])   . Status post chemotherapy    docetaxel/carboplatin/trastuzumab  . Strains to urinate   . Urgency of urination   1. History of seizures. The patient has been thoroughly  evaluated both here by Dr. Gaynell Face and at [facility] for this. Among many studies, she had an MRI of the brain on August 28th. This showed an incidental solitary small lesion in the left frontoparietal white matter. It was not felt to be suggestive of multiple sclerosis. A noncontrasted CT in August 2010 was negative. The patient has been off all seizure medications now for 2 months. There has been no evidence of seizure recurrence. 2. Complex psychology history (please refer to Dr. Iona Coach note in the E-chart from August 29, 2009) with evidence of anxiety disorder, depression and psychosis. The patient is currently on no medications for this and denies any of these symptoms at present. 3. History of migraines. 4. Remote history of multidrug abuse. 5. Status post wisdom teeth extraction.         History of MRSA skin infection   PAST SURGICAL HISTORY: Past Surgical History:  Procedure Laterality Date  . BIOPSY BREAST  10/14/2010   right needle core biopsy  . BREAST SURGERY  11/16/2010   bilateral mastectomy+ right axillary node dissection,T1cN1a, Her2+,ERPR+  (right breast invasive ductal carcinoma)  . CYSTO WITH HYDRODISTENSION N/A 07/23/2012   Procedure: CYSTOSCOPY/HYDRODISTENSION instillation of marcaine and pyridium;  Surgeon: Reece Packer, MD;  Location: [facility];  Service: Urology;  Laterality: N/A;  INSTILLATION    . I&D EXTREMITY  09/26/2011   Procedure: IRRIGATION AND DEBRIDEMENT EXTREMITY;  Surgeon: Tennis Must, MD;  Location[facility];  Service: Orthopedics;  Laterality: Right;  I&D right hand cat bite wound  . PORTACATH PLACEMENT  11/16/2010   placement of left subclavian port  . TRANSTHORACIC ECHOCARDIOGRAM  05-28-2012   LVF NORMAL/  EF 55-60%  . WISDOM TOOTH EXTRACTION      FAMILY HISTORY Family History  Problem Relation Age of Onset  . Cancer Maternal Grandfather   . Mental illness Brother   . Mental illness Son   The patient's mother is living, in  her mid 30s, with no history of breast or ovarian cancer. The patient does not know her biological father. The patient has an older brother with bipolar disease, and according to the patient, borderline schizophrenia.   GYNECOLOGIC HISTORY: (Updated January 2015) Menarche at age 35. She is G1, P1 with first pregnancy to term at age 89. She was not menopausal, and was still having regular periods at the time of her breast cancer diagnosis. She continues on every three-month goserelin.  SOCIAL HISTORY:  (Updated 05/21/2013) She used to work in a [employer] but had to leave that job because of the seizure problem. She's currently unemployed. Her husband, Glendell Docker, works as a Development worker, international aid. Skylinn has a son, Liane Comber, who also lives in [place], currently with the patient's parents.The patient attends a local [employer]. Peggyann Juba is pastor.    ADVANCED DIRECTIVES: Not in place  HEALTH MAINTENANCE:  (Updated 05/21/2013) Social History  Substance Use Topics  . Smoking status: Current  Every Day Smoker    Packs/day: 1.00    Years: 20.00    Types: Cigarettes  . Smokeless tobacco: Never Used  . Alcohol use Yes     Comment: 12/18/14 sporadically, socially     Colonoscopy: Never  PAP: July 2012  Bone density:  Never  Lipid panel: Not on file  Allergies  Allergen Reactions  . Tramadol Other (See Comments)    Seizures  . Phenytoin Nausea And Vomiting  . Thorazine [Chlorpromazine Hcl] Hives  . Ciprofloxacin Nausea And Vomiting  . Vancomycin Rash    Possible rash reported by MD    Current Outpatient Prescriptions  Medication Sig Dispense Refill  . ALPRAZolam (XANAX) 1 MG tablet TAKE 1 TABLET BY MOUTH 3 TIMES A DAY AS NEEDED FOR ANXIETY 90 tablet 1  . esomeprazole (NEXIUM) 20 MG capsule Take 20 mg by mouth daily at 12 noon.    Marland Kitchen goserelin (ZOLADEX) 10.8 MG injection Inject 10.8 mg into the skin every 3 (three) months.    Marland Kitchen letrozole (FEMARA) 2.5 MG tablet Take 1 tablet (2.5 mg  total) by mouth daily. 90 tablet 4  . topiramate (TOPAMAX) 50 MG tablet Take 1-2 tablets (50-100 mg total) by mouth 2 (two) times daily. 120 tablet 12   No current facility-administered medications for this visit.    Facility-Administered Medications Ordered in Other Visits  Medication Dose Route Frequency Provider Last Rate Last Dose  . goserelin (ZOLADEX) injection 10.8 mg  10.8 mg Subcutaneous Q90 days Chauncey Cruel, MD   10.8 mg at 02/02/16 1501   OBJECTIVE: Young white woman In no acute distress Vitals:   02/02/16 1418  BP: 103/70  Pulse: 76  Resp: 18  Temp: 98 F (36.7 C)  ECOG: 2 Body mass index is 20.73 kg/m. Filed Weights   02/02/16 1418  Weight: 124 lb 9.6 oz (56.5 kg)    Sclerae unicteric, EOMs intact Oropharynx clear and moist No cervical or supraclavicular adenopathy Lungs no rales or rhonchi Heart regular rate and rhythm Abd soft, nontender, positive bowel sounds MSK no focal spinal tenderness, no upper extremity lymphedema Neuro: nonfocal, well oriented, appropriate affect Breasts: Status post bilateral mastectomies. There is no evidence of chest wall recurrence. Both axillae are benign.   LAB RESULTS: Lab Results  Component Value Date   WBC 5.4 02/02/2016   NEUTROABS 3.5 02/02/2016   HGB 16.0 (H) 02/02/2016   HCT 47.1 (H) 02/02/2016   MCV 92.4 02/02/2016   PLT 200 02/02/2016      Chemistry      Component Value Date/Time   NA 140 02/02/2016 1401   K 3.7 02/02/2016 1401   CL 105 10/11/2012 1441   CO2 22 02/02/2016 1401   BUN 11.8 02/02/2016 1401   CREATININE 1.0 02/02/2016 1401      Component Value Date/Time   CALCIUM 9.7 02/02/2016 1401   ALKPHOS 119 02/02/2016 1401   AST 16 02/02/2016 1401   ALT 16 02/02/2016 1401   BILITOT 0.54 02/02/2016 1401         STUDIES: No results found.   ASSESSMENT: 40 y.o.  [place] woman, known to be BRCA1 and 2 negative,   (1) status post bilateral mastectomies May of 2012 for a right-sided  T1c N1 (stage II) grade 3 invasive ductal carcinoma,estrogen and progesterone receptor positive, HER-2 amplified, with an MIB-1 of 85%.   (2) status post carboplatin/ docetaxol/ trastuzumab x6, completed mid December 2012   (3) on trastuzumab every 3 weeks, started August of  2012, interrupted between April and August of 2013, completed 04/27/2012. Most recent echo 08/20/2012  (4) status post radiation therapy, completed 07/12/2011  (5) On tamoxifen starting April 2013, held as of 12/27/2012 with the diagnosis of a left upper extremity DVT.  On therapeutic Coumadin until  May 2015 when port removed and coumadin discontinued  (6) On goserelin given every 3 months, 1st injection on 10/26/11  (7) started on anastrozole November 2014, discontinued January 2015 because of side effects  (8) started letrozole August 2015  Other problems:  (1) Chronic Pain/ fibromyalgia  (2) history of seizure disorder   (3) history of severe migraines   (4)  Severe proteincalorie malnutrition: resolved   PLAN:  Revia is doing well overall and it is refreshing to see that she has some insight into her weight problem is also favorable that her seizure disorder on migraines are controlled at this point.  She is having significant menopausal symptoms. Options there include participating in our intimacy and pelvic health program and also consideration of gabapentin. At this point she prefers to continue as it.  She will receive goserelin today and every 12 weeks. She will see me in 48 weeks, but she knows I will be glad to see her before that if on when the need arises.   02/02/2016     Chauncey Cruel, MD

## 2016-02-02 NOTE — Patient Instructions (Signed)
Goserelin injection What is this medicine? GOSERELIN (GOE se rel in) is similar to a hormone found in the body. It lowers the amount of sex hormones that the body makes. Men will have lower testosterone levels and women will have lower estrogen levels while taking this medicine. In men, this medicine is used to treat prostate cancer; the injection is either given once per month or once every 12 weeks. A once per month injection (only) is used to treat women with endometriosis, dysfunctional uterine bleeding, or advanced breast cancer. This medicine may be used for other purposes; ask your health care provider or pharmacist if you have questions. COMMON BRAND NAME(S): Zoladex What should I tell my health care provider before I take this medicine? They need to know if you have any of these conditions (some only apply to women): -diabetes -heart disease or previous heart attack -high blood pressure -high cholesterol -kidney disease -osteoporosis or low bone density -problems passing urine -spinal cord injury -stroke -tobacco smoker -an unusual or allergic reaction to goserelin, hormone therapy, other medicines, foods, dyes, or preservatives -pregnant or trying to get pregnant -breast-feeding How should I use this medicine? This medicine is for injection under the skin. It is given by a health care professional in a hospital or clinic setting. Men receive this injection once every 4 weeks or once every 12 weeks. Women will only receive the once every 4 weeks injection. Talk to your pediatrician regarding the use of this medicine in children. Special care may be needed. Overdosage: If you think you have taken too much of this medicine contact a poison control center or emergency room at once. NOTE: This medicine is only for you. Do not share this medicine with others. What if I miss a dose? It is important not to miss your dose. Call your doctor or health care professional if you are unable to  keep an appointment. What may interact with this medicine? -female hormones like estrogen -herbal or dietary supplements like black cohosh, chasteberry, or DHEA -female hormones like testosterone -prasterone This list may not describe all possible interactions. Give your health care provider a list of all the medicines, herbs, non-prescription drugs, or dietary supplements you use. Also tell them if you smoke, drink alcohol, or use illegal drugs. Some items may interact with your medicine. What should I watch for while using this medicine? Visit your doctor or health care professional for regular checks on your progress. Your symptoms may appear to get worse during the first weeks of this therapy. Tell your doctor or healthcare professional if your symptoms do not start to get better or if they get worse after this time. Your bones may get weaker if you take this medicine for a long time. If you smoke or frequently drink alcohol you may increase your risk of bone loss. A family history of osteoporosis, chronic use of drugs for seizures (convulsions), or corticosteroids can also increase your risk of bone loss. Talk to your doctor about how to keep your bones strong. This medicine should stop regular monthly menstration in women. Tell your doctor if you continue to menstrate. Women should not become pregnant while taking this medicine or for 12 weeks after stopping this medicine. Women should inform their doctor if they wish to become pregnant or think they might be pregnant. There is a potential for serious side effects to an unborn child. Talk to your health care professional or pharmacist for more information. Do not breast-feed an infant while taking   this medicine. Men should inform their doctors if they wish to father a child. This medicine may lower sperm counts. Talk to your health care professional or pharmacist for more information. What side effects may I notice from receiving this  medicine? Side effects that you should report to your doctor or health care professional as soon as possible: -allergic reactions like skin rash, itching or hives, swelling of the face, lips, or tongue -bone pain -breathing problems -changes in vision -chest pain -feeling faint or lightheaded, falls -fever, chills -pain, swelling, warmth in the leg -pain, tingling, numbness in the hands or feet -signs and symptoms of low blood pressure like dizziness; feeling faint or lightheaded, falls; unusually weak or tired -stomach pain -swelling of the ankles, feet, hands -trouble passing urine or change in the amount of urine -unusually high or low blood pressure -unusually weak or tired Side effects that usually do not require medical attention (report to your doctor or health care professional if they continue or are bothersome): -change in sex drive or performance -changes in breast size in both males and females -changes in emotions or moods -headache -hot flashes -irritation at site where injected -loss of appetite -skin problems like acne, dry skin -vaginal dryness This list may not describe all possible side effects. Call your doctor for medical advice about side effects. You may report side effects to FDA at 1-800-FDA-1088. Where should I keep my medicine? This drug is given in a hospital or clinic and will not be stored at home. NOTE: This sheet is a summary. It may not cover all possible information. If you have questions about this medicine, talk to your doctor, pharmacist, or health care provider.  2015, [employer]. (2013-06-25 11:10:35)  

## 2016-02-13 ENCOUNTER — Other Ambulatory Visit: Payer: Self-pay | Admitting: Diagnostic Neuroimaging

## 2016-04-26 ENCOUNTER — Ambulatory Visit (HOSPITAL_BASED_OUTPATIENT_CLINIC_OR_DEPARTMENT_OTHER): Payer: Medicare Other

## 2016-04-26 VITALS — BP 128/85 | HR 77 | Temp 98.1°F | Resp 20

## 2016-04-26 DIAGNOSIS — Z5111 Encounter for antineoplastic chemotherapy: Secondary | ICD-10-CM | POA: Diagnosis present

## 2016-04-26 DIAGNOSIS — C773 Secondary and unspecified malignant neoplasm of axilla and upper limb lymph nodes: Secondary | ICD-10-CM | POA: Diagnosis not present

## 2016-04-26 DIAGNOSIS — T451X5A Adverse effect of antineoplastic and immunosuppressive drugs, initial encounter: Secondary | ICD-10-CM

## 2016-04-26 DIAGNOSIS — R600 Localized edema: Secondary | ICD-10-CM

## 2016-04-26 DIAGNOSIS — F5000 Anorexia nervosa, unspecified: Secondary | ICD-10-CM

## 2016-04-26 DIAGNOSIS — E46 Unspecified protein-calorie malnutrition: Secondary | ICD-10-CM

## 2016-04-26 DIAGNOSIS — R569 Unspecified convulsions: Secondary | ICD-10-CM

## 2016-04-26 DIAGNOSIS — R102 Pelvic and perineal pain: Secondary | ICD-10-CM

## 2016-04-26 DIAGNOSIS — Z8614 Personal history of Methicillin resistant Staphylococcus aureus infection: Secondary | ICD-10-CM

## 2016-04-26 DIAGNOSIS — M792 Neuralgia and neuritis, unspecified: Secondary | ICD-10-CM

## 2016-04-26 DIAGNOSIS — C50411 Malignant neoplasm of upper-outer quadrant of right female breast: Secondary | ICD-10-CM | POA: Diagnosis not present

## 2016-04-26 DIAGNOSIS — I427 Cardiomyopathy due to drug and external agent: Secondary | ICD-10-CM

## 2016-04-26 DIAGNOSIS — Z170 Estrogen receptor positive status [ER+]: Secondary | ICD-10-CM

## 2016-04-26 DIAGNOSIS — G40909 Epilepsy, unspecified, not intractable, without status epilepticus: Secondary | ICD-10-CM

## 2016-04-26 DIAGNOSIS — G8929 Other chronic pain: Secondary | ICD-10-CM

## 2016-04-26 MED ORDER — GOSERELIN ACETATE 10.8 MG ~~LOC~~ IMPL
10.8000 mg | DRUG_IMPLANT | SUBCUTANEOUS | Status: DC
  Administered 2016-04-26: 10.8 mg via SUBCUTANEOUS
  Filled 2016-04-26: qty 10.8

## 2016-04-26 NOTE — Patient Instructions (Signed)
Goserelin injection What is this medicine? GOSERELIN (GOE se rel in) is similar to a hormone found in the body. It lowers the amount of sex hormones that the body makes. Men will have lower testosterone levels and women will have lower estrogen levels while taking this medicine. In men, this medicine is used to treat prostate cancer; the injection is either given once per month or once every 12 weeks. A once per month injection (only) is used to treat women with endometriosis, dysfunctional uterine bleeding, or advanced breast cancer. This medicine may be used for other purposes; ask your health care provider or pharmacist if you have questions. COMMON BRAND NAME(S): Zoladex What should I tell my health care provider before I take this medicine? They need to know if you have any of these conditions (some only apply to women): -diabetes -heart disease or previous heart attack -high blood pressure -high cholesterol -kidney disease -osteoporosis or low bone density -problems passing urine -spinal cord injury -stroke -tobacco smoker -an unusual or allergic reaction to goserelin, hormone therapy, other medicines, foods, dyes, or preservatives -pregnant or trying to get pregnant -breast-feeding How should I use this medicine? This medicine is for injection under the skin. It is given by a health care professional in a hospital or clinic setting. Men receive this injection once every 4 weeks or once every 12 weeks. Women will only receive the once every 4 weeks injection. Talk to your pediatrician regarding the use of this medicine in children. Special care may be needed. Overdosage: If you think you have taken too much of this medicine contact a poison control center or emergency room at once. NOTE: This medicine is only for you. Do not share this medicine with others. What if I miss a dose? It is important not to miss your dose. Call your doctor or health care professional if you are unable to  keep an appointment. What may interact with this medicine? -female hormones like estrogen -herbal or dietary supplements like black cohosh, chasteberry, or DHEA -female hormones like testosterone -prasterone This list may not describe all possible interactions. Give your health care provider a list of all the medicines, herbs, non-prescription drugs, or dietary supplements you use. Also tell them if you smoke, drink alcohol, or use illegal drugs. Some items may interact with your medicine. What should I watch for while using this medicine? Visit your doctor or health care professional for regular checks on your progress. Your symptoms may appear to get worse during the first weeks of this therapy. Tell your doctor or healthcare professional if your symptoms do not start to get better or if they get worse after this time. Your bones may get weaker if you take this medicine for a long time. If you smoke or frequently drink alcohol you may increase your risk of bone loss. A family history of osteoporosis, chronic use of drugs for seizures (convulsions), or corticosteroids can also increase your risk of bone loss. Talk to your doctor about how to keep your bones strong. This medicine should stop regular monthly menstration in women. Tell your doctor if you continue to menstrate. Women should not become pregnant while taking this medicine or for 12 weeks after stopping this medicine. Women should inform their doctor if they wish to become pregnant or think they might be pregnant. There is a potential for serious side effects to an unborn child. Talk to your health care professional or pharmacist for more information. Do not breast-feed an infant while taking   this medicine. Men should inform their doctors if they wish to father a child. This medicine may lower sperm counts. Talk to your health care professional or pharmacist for more information. What side effects may I notice from receiving this  medicine? Side effects that you should report to your doctor or health care professional as soon as possible: -allergic reactions like skin rash, itching or hives, swelling of the face, lips, or tongue -bone pain -breathing problems -changes in vision -chest pain -feeling faint or lightheaded, falls -fever, chills -pain, swelling, warmth in the leg -pain, tingling, numbness in the hands or feet -signs and symptoms of low blood pressure like dizziness; feeling faint or lightheaded, falls; unusually weak or tired -stomach pain -swelling of the ankles, feet, hands -trouble passing urine or change in the amount of urine -unusually high or low blood pressure -unusually weak or tired Side effects that usually do not require medical attention (report to your doctor or health care professional if they continue or are bothersome): -change in sex drive or performance -changes in breast size in both males and females -changes in emotions or moods -headache -hot flashes -irritation at site where injected -loss of appetite -skin problems like acne, dry skin -vaginal dryness This list may not describe all possible side effects. Call your doctor for medical advice about side effects. You may report side effects to FDA at 1-800-FDA-1088. Where should I keep my medicine? This drug is given in a hospital or clinic and will not be stored at home. NOTE: This sheet is a summary. It may not cover all possible information. If you have questions about this medicine, talk to your doctor, pharmacist, or health care provider.  2017 Elsevier/Gold Standard (2013-06-25 11:10:35)  

## 2016-05-13 ENCOUNTER — Other Ambulatory Visit: Payer: Self-pay | Admitting: Oncology

## 2016-05-17 ENCOUNTER — Encounter (HOSPITAL_COMMUNITY): Payer: Self-pay

## 2016-05-17 ENCOUNTER — Emergency Department (HOSPITAL_COMMUNITY)
Admission: EM | Admit: 2016-05-17 | Discharge: 2016-05-17 | Disposition: A | Discharge status: Home / Self Care | Payer: Medicare Other | Attending: Emergency Medicine | Admitting: Emergency Medicine

## 2016-05-17 DIAGNOSIS — L03313 Cellulitis of chest wall: Secondary | ICD-10-CM | POA: Insufficient documentation

## 2016-05-17 DIAGNOSIS — Z5321 Procedure and treatment not carried out due to patient leaving prior to being seen by health care provider: Secondary | ICD-10-CM | POA: Diagnosis not present

## 2016-05-17 LAB — COMPREHENSIVE METABOLIC PANEL
ALBUMIN: 4.5 g/dL (ref 3.5–5.0)
ALK PHOS: 77 U/L (ref 38–126)
ALT: 33 U/L (ref 14–54)
ANION GAP: 10 (ref 5–15)
AST: 61 U/L — ABNORMAL HIGH (ref 15–41)
BUN: 14 mg/dL (ref 6–20)
CHLORIDE: 105 mmol/L (ref 101–111)
CO2: 24 mmol/L (ref 22–32)
CREATININE: 0.93 mg/dL (ref 0.44–1.00)
Calcium: 9.7 mg/dL (ref 8.9–10.3)
GFR calc non Af Amer: >60 mL/min (ref >60)
Glucose, Bld: 78 mg/dL (ref 65–99)
Potassium: 3.9 mmol/L (ref 3.5–5.1)
SODIUM: 139 mmol/L (ref 135–145)
Total Bilirubin: 1 mg/dL (ref 0.3–1.2)
Total Protein: 6.5 g/dL (ref 6.5–8.1)

## 2016-05-17 LAB — CBC WITH DIFFERENTIAL/PLATELET
Basophils Absolute: 0 10*3/uL (ref 0.0–0.1)
Basophils Relative: 0 %
Eosinophils Absolute: 0.2 10*3/uL (ref 0.0–0.7)
Eosinophils Relative: 3 %
HEMATOCRIT: 44.2 % (ref 36.0–46.0)
HEMOGLOBIN: 15.6 g/dL — AB (ref 12.0–15.0)
LYMPHS ABS: 2.6 10*3/uL (ref 0.7–4.0)
Lymphocytes Relative: 39 %
MCH: 32.4 pg (ref 26.0–34.0)
MCHC: 35.3 g/dL (ref 30.0–36.0)
MCV: 91.7 fL (ref 78.0–100.0)
MONOS PCT: 3 %
Monocytes Absolute: 0.2 10*3/uL (ref 0.1–1.0)
NEUTROS ABS: 3.6 10*3/uL (ref 1.7–7.7)
NEUTROS PCT: 55 %
Platelets: 193 10*3/uL (ref 150–400)
RBC: 4.82 MIL/uL (ref 3.87–5.11)
RDW: 13.5 % (ref 11.5–15.5)
WBC: 6.7 10*3/uL (ref 4.0–10.5)

## 2016-05-17 NOTE — ED Notes (Signed)
Pt stated they did not want to wait any longer. I did let the pt know it wouldn't be much longer, but they still did not want to stay. Pt and visitor left

## 2016-05-17 NOTE — ED Triage Notes (Signed)
Pt has red 1 inch skin sore on right upper chest that began 1 month ago. Pt also states that she has redness spreading across her chest. Pt denies fevers or chills. Pt has hx of MRSA.

## 2016-07-06 ENCOUNTER — Ambulatory Visit (INDEPENDENT_AMBULATORY_CARE_PROVIDER_SITE_OTHER): Payer: Medicare Other | Admitting: Physician Assistant

## 2016-07-06 VITALS — BP 106/80 | HR 93 | Temp 97.6°F | Resp 16 | Ht 65.0 in | Wt 116.0 lb

## 2016-07-06 DIAGNOSIS — M545 Low back pain, unspecified: Secondary | ICD-10-CM

## 2016-07-06 DIAGNOSIS — R21 Rash and other nonspecific skin eruption: Secondary | ICD-10-CM | POA: Diagnosis not present

## 2016-07-06 DIAGNOSIS — Z853 Personal history of malignant neoplasm of breast: Secondary | ICD-10-CM | POA: Diagnosis not present

## 2016-07-06 LAB — POCT SKIN KOH: Skin KOH, POC: NEGATIVE

## 2016-07-06 MED ORDER — TRIAMCINOLONE ACETONIDE 0.1 % EX CREA: 1.0000 "application " | TOPICAL_CREAM | Freq: Two times a day (BID) | CUTANEOUS | 0 refills | Status: DC

## 2016-07-06 MED ORDER — MELOXICAM 7.5 MG PO TABS: 7.5000 mg | ORAL_TABLET | Freq: Every day | ORAL | 1 refills | Status: DC

## 2016-07-06 MED ORDER — HYDROCODONE-ACETAMINOPHEN 5-325 MG PO TABS
1.0000 | ORAL_TABLET | Freq: Four times a day (QID) | ORAL | 0 refills | Status: DC | PRN
Start: 2016-07-06 — End: 2017-01-03

## 2016-07-06 NOTE — Progress Notes (Signed)
Crystal [name]  MRN: [id number] DOB: 1976/01/02  PCP: Lamar Blinks, MD  Subjective:  Pt is a 41 year old female PMH breast cancer, anorexia nervosa, DVT, depression and anxiety who presents to clinic for back pain x 2 days.  She was at the gym yesterday doing leg presses when she felt a pain in her back. Pain is 9/10. Located in the middle of her low back. Does not radiate. Sitting and standing makes it worse.  Denies saddle paresthesia, urinary symptoms, numbness, tingling, weakness. She took 2 aleve last night, did not help. She recently started going back to the gym.  Pt says she has had sciatica in the past - several years ago.   H/o chemotherapy for breast cancer.   Also c/o rash on right chest and right groin x several weeks. +itching. She has tried OTC cortisone cream, helping but not completely gone.   Review of Systems  Constitutional: Negative for chills, diaphoresis, fatigue and fever.  Respiratory: Negative for cough, chest tightness, shortness of breath and wheezing.   Cardiovascular: Negative for chest pain and palpitations.  Gastrointestinal: Negative for abdominal pain, diarrhea, nausea and vomiting.  Musculoskeletal: Positive for back pain.  Skin: Positive for rash.  Neurological: Negative for weakness, light-headedness and headaches.    Patient Active Problem List   Diagnosis Date Noted  . Anorexia nervosa 08/19/2014  . Back pain 08/19/2014  . Malnutrition, calorie ([place]) 12/03/2013  . Migraines 05/21/2013  . Breast cancer of upper-outer quadrant of right female breast ([place]) 03/21/2013  . Chronic pain 01/09/2013  . DVT of upper extremity (deep vein thrombosis) ([place]) 01/02/2013  . Arm edema 12/27/2012  . Neuropathic pain 12/27/2012  . Chronic female pelvic pain 08/21/2012  . Fatigue 08/20/2012  . Chemotherapy induced cardiomyopathy ([place]) 09/08/2011  . Generalized convulsive seizure ([place]) 07/13/2011  . Seizure disorder ([place]) 07/13/2011  . Migraine  07/13/2011  . Anxiety disorder 07/13/2011  . Depression 07/13/2011  . Hx MRSA infection 04/13/2011    Current Outpatient Prescriptions on File Prior to Visit  Medication Sig Dispense Refill  . ALPRAZolam (XANAX) 1 MG tablet TAKE 1 TABLET BY MOUTH 3 TIMES A DAY AS NEEDED FOR ANXIETY 90 tablet 1  . esomeprazole (NEXIUM) 20 MG capsule Take 20 mg by mouth daily at 12 noon.    Marland Kitchen goserelin (ZOLADEX) 10.8 MG injection Inject 10.8 mg into the skin every 3 (three) months.    Marland Kitchen letrozole (FEMARA) 2.5 MG tablet TAKE 1 TABLET (2.5 MG TOTAL) BY MOUTH DAILY. 90 tablet 1  . topiramate (TOPAMAX) 50 MG tablet Take 1-2 tablets (50-100 mg total) by mouth 2 (two) times daily. 120 tablet 12   Current Facility-Administered Medications on File Prior to Visit  Medication Dose Route Frequency Provider Last Rate Last Dose  . goserelin (ZOLADEX) injection 10.8 mg  10.8 mg Subcutaneous Q90 days Chauncey Cruel, MD   10.8 mg at 02/02/16 1501    Allergies  Allergen Reactions  . Tramadol Other (See Comments)    Seizures  . Phenytoin Nausea And Vomiting  . Thorazine [Chlorpromazine Hcl] Hives  . Ciprofloxacin Nausea And Vomiting  . Vancomycin Rash    Possible rash reported by MD     Objective:  BP 106/80   Pulse 93   Temp 97.6 F (36.4 C) (Oral)   Resp 16   Ht 5\' 5"  (1.651 m)   Wt 116 lb (52.6 kg)   SpO2 99%   BMI 19.30 kg/m   Physical Exam  Constitutional:  She is oriented to person, place, and time and well-developed, well-nourished, and in no distress. No distress.  Cardiovascular: Normal rate, regular rhythm and normal heart sounds.   Musculoskeletal:       Lumbar back: She exhibits decreased range of motion (pain with flexion and extension), tenderness, pain and spasm. She exhibits no bony tenderness.  Neurological: She is alert and oriented to person, place, and time. GCS score is 15.  Skin: Skin is warm and dry. Rash (2x2 cm scaly ) noted.  Psychiatric: Mood, memory, affect and judgment  normal.  Vitals reviewed.   Results for orders placed or performed in visit on 07/06/16  POCT Skin KOH  Result Value Ref Range   Skin KOH, POC Negative Negative    Assessment and Plan :  1. Acute midline low back pain without sciatica - meloxicam (MOBIC) 7.5 MG tablet; Take 1 tablet (7.5 mg total) by mouth daily. Max dose 15mg /day  Dispense: 30 tablet; Refill: 1 - HYDROcodone-acetaminophen (NORCO) 5-325 MG tablet; Take 1 tablet by mouth every 6 (six) hours as needed.  Dispense: 24 tablet; Refill: 0 - Pt declined prednisone due to h/o "irritability" during cancer treatment. She has tried Flexeril in the past, which has not helped pain control. She prefers Norco for pain. She was Rx this medication 05/2014 for knee pain. No records found on [place] controlled substance abuse site. OK to prescribe Norco today. Discussed with pt this is a controlled substance and is temporary pain treatment. Discussed use of heat, activity modification, proper body mechanics, stretches and exercise. RTC in 3-4 weeks if no improvement. Consider physical therapy or orthopedic referral.   2. Rash - POCT Skin KOH - triamcinolone cream (KENALOG) 0.1 %; Apply 1 application topically 2 (two) times daily.  Dispense: 30 g; Refill: 0  3. History of breast cancer   Mercer Pod, PA-C  [facility] [place] 07/06/2016 9:41 AM

## 2016-07-06 NOTE — Patient Instructions (Addendum)
Apply heat and/or cold packs 2-3 times a day for 20-30 minutes at a time.   Activity modification is as important as analgesics treatment in the management of acute radiculopathy. The goals are to lessen nerve root impingement and to avoid activities that exacerbate pain. Patients often identify pain-relieving positions for themselves. Some individuals report that semisitting, reclining, or lying in certain positions in bed provide meaningful pain relief. Others find that recumbency in bed is associated with increased pain. Such patients may prefer to be up and around, even going about light work duties. Modification or avoidance of activity does not imply prolonged complete bed rest. When the acute symptoms decrease, I recommend resuming modest physical activity as tolerated.   Please come back if you are not better in 3-4 weeks.  Thank you for coming in today. I hope you feel we met your needs.  Feel free to call [facility] if you have any questions or further requests.  Please consider signing up for MyChart if you do not already have it, as this is a great way to communicate with me.  Best,  Whitney McVey, PA-C  Patient is to perform exercises below at 5 sets with 10 repetitions. Stretches are to be performed for 5 sets, 10 seconds each. Recommended she perform this rehab twice daily within pain tolerance for 2 weeks.  Low Back Strain Rehab Ask your health care provider which exercises are safe for you. Do exercises exactly as told by your health care provider and adjust them as directed. It is normal to feel mild stretching, pulling, tightness, or discomfort as you do these exercises, but you should stop right away if you feel sudden pain or your pain gets worse. Do not begin these exercises until told by your health care provider. Stretching and range of motion exercises These exercises warm up your muscles and joints and improve the movement and flexibility of your back. These exercises also help  to relieve pain, numbness, and tingling. Exercise A: Single knee to chest   1. Lie on your back on a firm surface with both legs straight. 2. Bend one of your knees. Use your hands to move your knee up toward your chest until you feel a gentle stretch in your lower back and buttock.  Hold your leg in this position by holding onto the front of your knee.  Keep your other leg as straight as possible. 3. Hold for __________ seconds. 4. Slowly return to the starting position. 5. Repeat with your other leg. Repeat __________ times. Complete this exercise __________ times a day. Exercise B: Prone extension on elbows   1. Lie on your abdomen on a firm surface. 2. Prop yourself up on your elbows. 3. Use your arms to help lift your chest up until you feel a gentle stretch in your abdomen and your lower back.  This will place some of your body weight on your elbows. If this is uncomfortable, try stacking pillows under your chest.  Your hips should stay down, against the surface that you are lying on. Keep your hip and back muscles relaxed. 4. Hold for __________ seconds. 5. Slowly relax your upper body and return to the starting position. Repeat __________ times. Complete this exercise __________ times a day. Strengthening exercises These exercises build strength and endurance in your back. Endurance is the ability to use your muscles for a long time, even after they get tired. Exercise C: Pelvic tilt  1. Lie on your back on a firm surface.  Bend your knees and keep your feet flat. 2. Tense your abdominal muscles. Tip your pelvis up toward the ceiling and flatten your lower back into the floor.  To help with this exercise, you may place a small towel under your lower back and try to push your back into the towel. 3. Hold for __________ seconds. 4. Let your muscles relax completely before you repeat this exercise. Repeat __________ times. Complete this exercise __________ times a day. Exercise  D: Alternating arm and leg raises   1. Get on your hands and knees on a firm surface. If you are on a hard floor, you may want to use padding to cushion your knees, such as an exercise mat. 2. Line up your arms and legs. Your hands should be below your shoulders, and your knees should be below your hips. 3. Lift your left leg behind you. At the same time, raise your right arm and straighten it in front of you.  Do not lift your leg higher than your hip.  Do not lift your arm higher than your shoulder.  Keep your abdominal and back muscles tight.  Keep your hips facing the ground.  Do not arch your back.  Keep your balance carefully, and do not hold your breath. 4. Hold for __________ seconds. 5. Slowly return to the starting position and repeat with your right leg and your left arm. Repeat __________ times. Complete this exercise __________times a day. Exercise J: Single leg lower with bent knees  1. Lie on your back on a firm surface. 2. Tense your abdominal muscles and lift your feet off the floor, one foot at a time, so your knees and hips are bent in an "L" shape (at about 90 degrees).  Your knees should be over your hips and your lower legs should be parallel to the floor. 3. Keeping your abdominal muscles tense and your knee bent, slowly lower one of your legs so your toe touches the ground. 4. Lift your leg back up to return to the starting position.  Do not hold your breath.  Do not let your back arch. Keep your back flat against the ground. 5. Repeat with your other leg. Repeat __________ times. Complete this exercise __________ times a day. Posture and body mechanics   Body mechanics refers to the movements and positions of your body while you do your daily activities. Posture is part of body mechanics. Good posture and healthy body mechanics can help to relieve stress in your body's tissues and joints. Good posture means that your spine is in its natural S-curve position  (your spine is neutral), your shoulders are pulled back slightly, and your head is not tipped forward. The following are general guidelines for applying improved posture and body mechanics to your everyday activities. Standing    When standing, keep your spine neutral and your feet about hip-width apart. Keep a slight bend in your knees. Your ears, shoulders, and hips should line up.  When you do a task in which you stand in one place for a long time, place one foot up on a stable object that is 2-4 inches (5-10 cm) high, such as a footstool. This helps keep your spine neutral. Sitting    When sitting, keep your spine neutral and keep your feet flat on the floor. Use a footrest, if necessary, and keep your thighs parallel to the floor. Avoid rounding your shoulders, and avoid tilting your head forward.  When working at a desk or  a computer, keep your desk at a height where your hands are slightly lower than your elbows. Slide your chair under your desk so you are close enough to maintain good posture.  When working at a computer, place your monitor at a height where you are looking straight ahead and you do not have to tilt your head forward or downward to look at the screen. Resting    When lying down and resting, avoid positions that are most painful for you.  If you have pain with activities such as sitting, bending, stooping, or squatting (flexion-based activities), lie in a position in which your body does not bend very much. For example, avoid curling up on your side with your arms and knees near your chest (fetal position).  If you have pain with activities such as standing for a long time or reaching with your arms (extension-based activities), lie with your spine in a neutral position and bend your knees slightly. Try the following positions:  Lying on your side with a pillow between your knees.  Lying on your back with a pillow under your knees. Lifting    When lifting  objects, keep your feet at least shoulder-width apart and tighten your abdominal muscles.  Bend your knees and hips and keep your spine neutral. It is important to lift using the strength of your legs, not your back. Do not lock your knees straight out.  Always ask for help to lift heavy or awkward objects. This information is not intended to replace advice given to you by your health care provider. Make sure you discuss any questions you have with your health care provider. Document Released: 04/18/2005 Document Revised: 12/24/2015 Document Reviewed: 01/28/2015 Elsevier Interactive Patient Education  2017 [employer].   IF you received an x-ray today, you will receive an invoice from [facility]. Please contact [facility] at [phone] with questions or concerns regarding your invoice.   IF you received labwork today, you will receive an invoice from [facility]. Please contact [facility] at [phone] with questions or concerns regarding your invoice.   Our billing staff will not be able to assist you with questions regarding bills from these companies.  You will be contacted with the lab results as soon as they are available. The fastest way to get your results is to activate your My Chart account. Instructions are located on the last page of this paperwork. If you have not heard from [place] regarding the results in 2 weeks, please contact this office.

## 2016-07-19 ENCOUNTER — Ambulatory Visit (HOSPITAL_BASED_OUTPATIENT_CLINIC_OR_DEPARTMENT_OTHER): Payer: Medicare Other

## 2016-07-19 VITALS — BP 115/81 | HR 78 | Temp 98.0°F | Resp 18

## 2016-07-19 DIAGNOSIS — M792 Neuralgia and neuritis, unspecified: Secondary | ICD-10-CM

## 2016-07-19 DIAGNOSIS — F5000 Anorexia nervosa, unspecified: Secondary | ICD-10-CM

## 2016-07-19 DIAGNOSIS — G8929 Other chronic pain: Secondary | ICD-10-CM

## 2016-07-19 DIAGNOSIS — E46 Unspecified protein-calorie malnutrition: Secondary | ICD-10-CM

## 2016-07-19 DIAGNOSIS — R102 Pelvic and perineal pain: Secondary | ICD-10-CM

## 2016-07-19 DIAGNOSIS — I427 Cardiomyopathy due to drug and external agent: Secondary | ICD-10-CM

## 2016-07-19 DIAGNOSIS — G40909 Epilepsy, unspecified, not intractable, without status epilepticus: Secondary | ICD-10-CM

## 2016-07-19 DIAGNOSIS — R569 Unspecified convulsions: Secondary | ICD-10-CM

## 2016-07-19 DIAGNOSIS — R600 Localized edema: Secondary | ICD-10-CM

## 2016-07-19 DIAGNOSIS — C50411 Malignant neoplasm of upper-outer quadrant of right female breast: Secondary | ICD-10-CM

## 2016-07-19 DIAGNOSIS — Z5111 Encounter for antineoplastic chemotherapy: Secondary | ICD-10-CM | POA: Diagnosis present

## 2016-07-19 DIAGNOSIS — Z170 Estrogen receptor positive status [ER+]: Secondary | ICD-10-CM

## 2016-07-19 DIAGNOSIS — T451X5A Adverse effect of antineoplastic and immunosuppressive drugs, initial encounter: Secondary | ICD-10-CM

## 2016-07-19 DIAGNOSIS — Z8614 Personal history of Methicillin resistant Staphylococcus aureus infection: Secondary | ICD-10-CM

## 2016-07-19 MED ORDER — GOSERELIN ACETATE 10.8 MG ~~LOC~~ IMPL
10.8000 mg | DRUG_IMPLANT | SUBCUTANEOUS | Status: DC
  Administered 2016-07-19: 10.8 mg via SUBCUTANEOUS
  Filled 2016-07-19: qty 10.8

## 2016-07-19 NOTE — Patient Instructions (Signed)
Goserelin injection What is this medicine? GOSERELIN (GOE se rel in) is similar to a hormone found in the body. It lowers the amount of sex hormones that the body makes. Men will have lower testosterone levels and women will have lower estrogen levels while taking this medicine. In men, this medicine is used to treat prostate cancer; the injection is either given once per month or once every 12 weeks. A once per month injection (only) is used to treat women with endometriosis, dysfunctional uterine bleeding, or advanced breast cancer. This medicine may be used for other purposes; ask your health care provider or pharmacist if you have questions. COMMON BRAND NAME(S): Zoladex What should I tell my health care provider before I take this medicine? They need to know if you have any of these conditions (some only apply to women): -diabetes -heart disease or previous heart attack -high blood pressure -high cholesterol -kidney disease -osteoporosis or low bone density -problems passing urine -spinal cord injury -stroke -tobacco smoker -an unusual or allergic reaction to goserelin, hormone therapy, other medicines, foods, dyes, or preservatives -pregnant or trying to get pregnant -breast-feeding How should I use this medicine? This medicine is for injection under the skin. It is given by a health care professional in a hospital or clinic setting. Men receive this injection once every 4 weeks or once every 12 weeks. Women will only receive the once every 4 weeks injection. Talk to your pediatrician regarding the use of this medicine in children. Special care may be needed. Overdosage: If you think you have taken too much of this medicine contact a poison control center or emergency room at once. NOTE: This medicine is only for you. Do not share this medicine with others. What if I miss a dose? It is important not to miss your dose. Call your doctor or health care professional if you are unable to  keep an appointment. What may interact with this medicine? -female hormones like estrogen -herbal or dietary supplements like black cohosh, chasteberry, or DHEA -female hormones like testosterone -prasterone This list may not describe all possible interactions. Give your health care provider a list of all the medicines, herbs, non-prescription drugs, or dietary supplements you use. Also tell them if you smoke, drink alcohol, or use illegal drugs. Some items may interact with your medicine. What should I watch for while using this medicine? Visit your doctor or health care professional for regular checks on your progress. Your symptoms may appear to get worse during the first weeks of this therapy. Tell your doctor or healthcare professional if your symptoms do not start to get better or if they get worse after this time. Your bones may get weaker if you take this medicine for a long time. If you smoke or frequently drink alcohol you may increase your risk of bone loss. A family history of osteoporosis, chronic use of drugs for seizures (convulsions), or corticosteroids can also increase your risk of bone loss. Talk to your doctor about how to keep your bones strong. This medicine should stop regular monthly menstration in women. Tell your doctor if you continue to menstrate. Women should not become pregnant while taking this medicine or for 12 weeks after stopping this medicine. Women should inform their doctor if they wish to become pregnant or think they might be pregnant. There is a potential for serious side effects to an unborn child. Talk to your health care professional or pharmacist for more information. Do not breast-feed an infant while taking   this medicine. Men should inform their doctors if they wish to father a child. This medicine may lower sperm counts. Talk to your health care professional or pharmacist for more information. What side effects may I notice from receiving this  medicine? Side effects that you should report to your doctor or health care professional as soon as possible: -allergic reactions like skin rash, itching or hives, swelling of the face, lips, or tongue -bone pain -breathing problems -changes in vision -chest pain -feeling faint or lightheaded, falls -fever, chills -pain, swelling, warmth in the leg -pain, tingling, numbness in the hands or feet -signs and symptoms of low blood pressure like dizziness; feeling faint or lightheaded, falls; unusually weak or tired -stomach pain -swelling of the ankles, feet, hands -trouble passing urine or change in the amount of urine -unusually high or low blood pressure -unusually weak or tired Side effects that usually do not require medical attention (report to your doctor or health care professional if they continue or are bothersome): -change in sex drive or performance -changes in breast size in both males and females -changes in emotions or moods -headache -hot flashes -irritation at site where injected -loss of appetite -skin problems like acne, dry skin -vaginal dryness This list may not describe all possible side effects. Call your doctor for medical advice about side effects. You may report side effects to FDA at 1-800-FDA-1088. Where should I keep my medicine? This drug is given in a hospital or clinic and will not be stored at home. NOTE: This sheet is a summary. It may not cover all possible information. If you have questions about this medicine, talk to your doctor, pharmacist, or health care provider.  2015, [employer]. (2013-06-25 11:10:35)  

## 2016-10-11 ENCOUNTER — Ambulatory Visit (HOSPITAL_BASED_OUTPATIENT_CLINIC_OR_DEPARTMENT_OTHER): Payer: Medicare Other

## 2016-10-11 VITALS — BP 133/88 | HR 88 | Temp 97.4°F | Resp 20

## 2016-10-11 DIAGNOSIS — C50411 Malignant neoplasm of upper-outer quadrant of right female breast: Secondary | ICD-10-CM | POA: Diagnosis not present

## 2016-10-11 DIAGNOSIS — R569 Unspecified convulsions: Secondary | ICD-10-CM

## 2016-10-11 DIAGNOSIS — M792 Neuralgia and neuritis, unspecified: Secondary | ICD-10-CM

## 2016-10-11 DIAGNOSIS — Z170 Estrogen receptor positive status [ER+]: Secondary | ICD-10-CM

## 2016-10-11 DIAGNOSIS — T451X5A Adverse effect of antineoplastic and immunosuppressive drugs, initial encounter: Secondary | ICD-10-CM

## 2016-10-11 DIAGNOSIS — F5000 Anorexia nervosa, unspecified: Secondary | ICD-10-CM

## 2016-10-11 DIAGNOSIS — G40909 Epilepsy, unspecified, not intractable, without status epilepticus: Secondary | ICD-10-CM

## 2016-10-11 DIAGNOSIS — Z8614 Personal history of Methicillin resistant Staphylococcus aureus infection: Secondary | ICD-10-CM

## 2016-10-11 DIAGNOSIS — R102 Pelvic and perineal pain: Secondary | ICD-10-CM

## 2016-10-11 DIAGNOSIS — I427 Cardiomyopathy due to drug and external agent: Secondary | ICD-10-CM

## 2016-10-11 DIAGNOSIS — R600 Localized edema: Secondary | ICD-10-CM

## 2016-10-11 DIAGNOSIS — Z5111 Encounter for antineoplastic chemotherapy: Secondary | ICD-10-CM

## 2016-10-11 DIAGNOSIS — E46 Unspecified protein-calorie malnutrition: Secondary | ICD-10-CM

## 2016-10-11 DIAGNOSIS — G8929 Other chronic pain: Secondary | ICD-10-CM

## 2016-10-11 MED ORDER — GOSERELIN ACETATE 10.8 MG ~~LOC~~ IMPL
10.8000 mg | DRUG_IMPLANT | SUBCUTANEOUS | Status: DC
  Administered 2016-10-11: 10.8 mg via SUBCUTANEOUS
  Filled 2016-10-11: qty 10.8

## 2016-10-11 NOTE — Patient Instructions (Signed)
Goserelin injection What is this medicine? GOSERELIN (GOE se rel in) is similar to a hormone found in the body. It lowers the amount of sex hormones that the body makes. Men will have lower testosterone levels and women will have lower estrogen levels while taking this medicine. In men, this medicine is used to treat prostate cancer; the injection is either given once per month or once every 12 weeks. A once per month injection (only) is used to treat women with endometriosis, dysfunctional uterine bleeding, or advanced breast cancer. This medicine may be used for other purposes; ask your health care provider or pharmacist if you have questions. COMMON BRAND NAME(S): Zoladex What should I tell my health care provider before I take this medicine? They need to know if you have any of these conditions (some only apply to women): -diabetes -heart disease or previous heart attack -high blood pressure -high cholesterol -kidney disease -osteoporosis or low bone density -problems passing urine -spinal cord injury -stroke -tobacco smoker -an unusual or allergic reaction to goserelin, hormone therapy, other medicines, foods, dyes, or preservatives -pregnant or trying to get pregnant -breast-feeding How should I use this medicine? This medicine is for injection under the skin. It is given by a health care professional in a hospital or clinic setting. Men receive this injection once every 4 weeks or once every 12 weeks. Women will only receive the once every 4 weeks injection. Talk to your pediatrician regarding the use of this medicine in children. Special care may be needed. Overdosage: If you think you have taken too much of this medicine contact a poison control center or emergency room at once. NOTE: This medicine is only for you. Do not share this medicine with others. What if I miss a dose? It is important not to miss your dose. Call your doctor or health care professional if you are unable to  keep an appointment. What may interact with this medicine? -female hormones like estrogen -herbal or dietary supplements like black cohosh, chasteberry, or DHEA -female hormones like testosterone -prasterone This list may not describe all possible interactions. Give your health care provider a list of all the medicines, herbs, non-prescription drugs, or dietary supplements you use. Also tell them if you smoke, drink alcohol, or use illegal drugs. Some items may interact with your medicine. What should I watch for while using this medicine? Visit your doctor or health care professional for regular checks on your progress. Your symptoms may appear to get worse during the first weeks of this therapy. Tell your doctor or healthcare professional if your symptoms do not start to get better or if they get worse after this time. Your bones may get weaker if you take this medicine for a long time. If you smoke or frequently drink alcohol you may increase your risk of bone loss. A family history of osteoporosis, chronic use of drugs for seizures (convulsions), or corticosteroids can also increase your risk of bone loss. Talk to your doctor about how to keep your bones strong. This medicine should stop regular monthly menstration in women. Tell your doctor if you continue to menstrate. Women should not become pregnant while taking this medicine or for 12 weeks after stopping this medicine. Women should inform their doctor if they wish to become pregnant or think they might be pregnant. There is a potential for serious side effects to an unborn child. Talk to your health care professional or pharmacist for more information. Do not breast-feed an infant while taking   this medicine. Men should inform their doctors if they wish to father a child. This medicine may lower sperm counts. Talk to your health care professional or pharmacist for more information. What side effects may I notice from receiving this  medicine? Side effects that you should report to your doctor or health care professional as soon as possible: -allergic reactions like skin rash, itching or hives, swelling of the face, lips, or tongue -bone pain -breathing problems -changes in vision -chest pain -feeling faint or lightheaded, falls -fever, chills -pain, swelling, warmth in the leg -pain, tingling, numbness in the hands or feet -signs and symptoms of low blood pressure like dizziness; feeling faint or lightheaded, falls; unusually weak or tired -stomach pain -swelling of the ankles, feet, hands -trouble passing urine or change in the amount of urine -unusually high or low blood pressure -unusually weak or tired Side effects that usually do not require medical attention (report to your doctor or health care professional if they continue or are bothersome): -change in sex drive or performance -changes in breast size in both males and females -changes in emotions or moods -headache -hot flashes -irritation at site where injected -loss of appetite -skin problems like acne, dry skin -vaginal dryness This list may not describe all possible side effects. Call your doctor for medical advice about side effects. You may report side effects to FDA at 1-800-FDA-1088. Where should I keep my medicine? This drug is given in a hospital or clinic and will not be stored at home. NOTE: This sheet is a summary. It may not cover all possible information. If you have questions about this medicine, talk to your doctor, pharmacist, or health care provider.  2015, [employer]. (2013-06-25 11:10:35)  

## 2016-12-30 ENCOUNTER — Other Ambulatory Visit: Payer: Self-pay | Admitting: *Deleted

## 2016-12-30 DIAGNOSIS — C50411 Malignant neoplasm of upper-outer quadrant of right female breast: Secondary | ICD-10-CM

## 2016-12-30 DIAGNOSIS — Z170 Estrogen receptor positive status [ER+]: Principal | ICD-10-CM

## 2017-01-03 ENCOUNTER — Other Ambulatory Visit (HOSPITAL_BASED_OUTPATIENT_CLINIC_OR_DEPARTMENT_OTHER): Payer: Medicare Other

## 2017-01-03 ENCOUNTER — Ambulatory Visit (HOSPITAL_BASED_OUTPATIENT_CLINIC_OR_DEPARTMENT_OTHER): Payer: Medicare Other | Admitting: Oncology

## 2017-01-03 ENCOUNTER — Ambulatory Visit (HOSPITAL_BASED_OUTPATIENT_CLINIC_OR_DEPARTMENT_OTHER): Payer: Medicare Other

## 2017-01-03 VITALS — BP 114/82 | HR 70 | Temp 98.1°F | Resp 20 | Ht 65.0 in | Wt 105.9 lb

## 2017-01-03 DIAGNOSIS — R102 Pelvic and perineal pain: Secondary | ICD-10-CM

## 2017-01-03 DIAGNOSIS — R569 Unspecified convulsions: Secondary | ICD-10-CM

## 2017-01-03 DIAGNOSIS — C773 Secondary and unspecified malignant neoplasm of axilla and upper limb lymph nodes: Secondary | ICD-10-CM | POA: Diagnosis not present

## 2017-01-03 DIAGNOSIS — Z5111 Encounter for antineoplastic chemotherapy: Secondary | ICD-10-CM | POA: Diagnosis not present

## 2017-01-03 DIAGNOSIS — Z79811 Long term (current) use of aromatase inhibitors: Secondary | ICD-10-CM

## 2017-01-03 DIAGNOSIS — C50411 Malignant neoplasm of upper-outer quadrant of right female breast: Secondary | ICD-10-CM

## 2017-01-03 DIAGNOSIS — Z170 Estrogen receptor positive status [ER+]: Principal | ICD-10-CM

## 2017-01-03 DIAGNOSIS — I427 Cardiomyopathy due to drug and external agent: Secondary | ICD-10-CM

## 2017-01-03 DIAGNOSIS — F5000 Anorexia nervosa, unspecified: Secondary | ICD-10-CM

## 2017-01-03 DIAGNOSIS — G40909 Epilepsy, unspecified, not intractable, without status epilepticus: Secondary | ICD-10-CM

## 2017-01-03 DIAGNOSIS — E46 Unspecified protein-calorie malnutrition: Secondary | ICD-10-CM

## 2017-01-03 DIAGNOSIS — T451X5A Adverse effect of antineoplastic and immunosuppressive drugs, initial encounter: Secondary | ICD-10-CM

## 2017-01-03 DIAGNOSIS — Z8614 Personal history of Methicillin resistant Staphylococcus aureus infection: Secondary | ICD-10-CM

## 2017-01-03 DIAGNOSIS — R600 Localized edema: Secondary | ICD-10-CM

## 2017-01-03 DIAGNOSIS — M792 Neuralgia and neuritis, unspecified: Secondary | ICD-10-CM

## 2017-01-03 DIAGNOSIS — G8929 Other chronic pain: Secondary | ICD-10-CM

## 2017-01-03 LAB — COMPREHENSIVE METABOLIC PANEL
ALBUMIN: 4.4 g/dL (ref 3.5–5.0)
ALT: 27 U/L (ref 0–55)
AST: 31 U/L (ref 5–34)
Alkaline Phosphatase: 115 U/L (ref 40–150)
Anion Gap: 10 mEq/L (ref 3–11)
BILIRUBIN TOTAL: 0.41 mg/dL (ref 0.20–1.20)
BUN: 15.2 mg/dL (ref 7.0–26.0)
CO2: 25 meq/L (ref 22–29)
Calcium: 9.7 mg/dL (ref 8.4–10.4)
Chloride: 106 mEq/L (ref 98–109)
Creatinine: 0.9 mg/dL (ref 0.6–1.1)
EGFR: 76 mL/min/{1.73_m2} — AB (ref >90)
GLUCOSE: 93 mg/dL (ref 70–140)
POTASSIUM: 3.5 meq/L (ref 3.5–5.1)
SODIUM: 140 meq/L (ref 136–145)
TOTAL PROTEIN: 7.1 g/dL (ref 6.4–8.3)

## 2017-01-03 LAB — CBC WITH DIFFERENTIAL/PLATELET
BASO%: 0.5 % (ref 0.0–2.0)
Basophils Absolute: 0 10*3/uL (ref 0.0–0.1)
EOS ABS: 0.1 10*3/uL (ref 0.0–0.5)
EOS%: 2 % (ref 0.0–7.0)
HCT: 41.7 % (ref 34.8–46.6)
HEMOGLOBIN: 14.8 g/dL (ref 11.6–15.9)
LYMPH%: 33.3 % (ref 14.0–49.7)
MCH: 33.8 pg (ref 25.1–34.0)
MCHC: 35.5 g/dL (ref 31.5–36.0)
MCV: 95.2 fL (ref 79.5–101.0)
MONO#: 0.3 10*3/uL (ref 0.1–0.9)
MONO%: 3.8 % (ref 0.0–14.0)
NEUT%: 60.4 % (ref 38.4–76.8)
NEUTROS ABS: 4 10*3/uL (ref 1.5–6.5)
Platelets: 204 10*3/uL (ref 145–400)
RBC: 4.38 10*6/uL (ref 3.70–5.45)
RDW: 12.5 % (ref 11.2–14.5)
WBC: 6.6 10*3/uL (ref 3.9–10.3)
lymph#: 2.2 10*3/uL (ref 0.9–3.3)

## 2017-01-03 MED ORDER — GOSERELIN ACETATE 10.8 MG ~~LOC~~ IMPL
10.8000 mg | DRUG_IMPLANT | SUBCUTANEOUS | Status: DC
  Administered 2017-01-03: 10.8 mg via SUBCUTANEOUS
  Filled 2017-01-03: qty 10.8

## 2017-01-03 NOTE — Progress Notes (Signed)
ID: Crystal [name]   DOB: 15-Jan-1976  MR#: [id number]  BDZ#:[id number]  TMH:[id number], Crystal Filler, MD GYN: Kasandra KnudsenLorine Bears, MD OTHER:  Crystal [name], Crystal [name], Crystal [name]   CHIEF COMPLAINT:  Hx of Right Breast Cancer  CURRENT THERAPY: Goserelin,  letrozole   BREAST CANCER HISTORY: From the initial intake note:  "The patient is a 41 year old Crystal [name] woman who noted a mass in her right breast and brought it to her primary care physician's attention June of 2012. She was set up for mammography at [facility], where an area of calcifications highly suspicious for carcinoma was biopsied. This was in the upper outer quadrant and it measured 1.7 cm mammographically and 1.8 cm by ultrasonography. The pathology report ([id number]) showed a high-grade invasive ductal carcinoma estrogen receptor positive at 64% progesterone receptor positive at 18%, and HER-2 amplified with a ratio by CISH of 5.24. MIB-1-1 was 65%. A second mass biopsied at the same time was also triple positive.  Given her multiple masses in the right breast, mastectomy was recommended. The patient actually opted for bilateral mastectomies and this was performed together with a right axillary lymph node dissection under Dr. Star [name] on 11/16/2010. The final pathology [id number]) showed, on the left, no evidence of cancer. On the right the patient had a high-grade invasive ductal carcinoma measuring 1.9 cm, with negative margins, grade 3, involving 2 of 16 lymph nodes (stage IIB)."  Her subsequent history is as detailed below.  INTERVAL HISTORY:  Crystal [name] returns today for follow-up and treatment of her remote breast cancer. She continues on letrozole. She also receives goserelin every 3 months. She tolerates these treatments well, with no significant problems from the goserelin aside from the discomfort of administration.Hot flashes and vaginal dryness are not a major issue. She never developed the arthralgias or myalgias  that many patients can experience on this medication. She obtains it at a good price.  Anorexia continues to be a problem. She tells me that she is being seen by [facility], specifically Crystal [name]. She was not able to tone exactly what medications she is on right now. She did meet with our nutritionist sometime ago and found that helpful. Nevertheless there has been a 20 pound weight loss over the past one year   REVIEW OF SYSTEMS: Crystal [name] her brother-in-law, her husband", died, and her mother-in-law, in her 60s, is now moved in with them. This seems to be a permanent arrangement. It is causing quite a bit of stress. She continues to be a very picky eater and otherwise carbohydrates. She tells me she had added fish and chicken to her diet. He goes to the gym regularly. A detailed review of systems today was otherwise stable   PAST MEDICAL HISTORY: Past Medical History:  Diagnosis Date  . Anxiety   . Bipolar affective disorder, mixed ([place])   . Cancer ([place])   . Depression   . Family history of anesthesia complication    MOTHER PONV  . Frequency of urination   . GERD (gastroesophageal reflux disease)    OCCASIONALLY TAKE ZANTAC  . Heart palpitations   . History of breast cancer DX JUNE 2012 W/ RIGHT BREAST INVASIVE DUCTAL CA  STAGE IIB---  S/P BILATERAL MASTECTOMIES AND RIGHT NODE DISSECTION   ONCOLOGIST-- DR Crystal [name]--  CHEMO ENDED [place] 2012 / RADIATION ENDED MAR 2013--  CURRENT ON TAMOXIFEN AND ZOLADEX  . History of idiopathic seizure    DX 2008 --  LAST ONE 2012--  NO ISSUES SINCE AND NO MEDS. -- PT STATES DOCTORS FELT IT WAS STRESS RELATED DUE TO HEALTH ISSUES  . Hot flashes due to tamoxifen   . Hx MRSA infection 03/28/11   Skin  . Left ovarian cyst   . Nocturia   . Right lower quadrant abdominal pain   . Seizures ([place])   . Status post chemotherapy    docetaxel/carboplatin/trastuzumab  . Strains to urinate   . Urgency of urination   1. History of seizures. The  patient has been thoroughly evaluated both here by Crystal [name] and at [facility] for this. Among many studies, she had an MRI of the brain on August 28th. This showed an incidental solitary small lesion in the left frontoparietal white matter. It was not felt to be suggestive of multiple sclerosis. A noncontrasted CT in August 2010 was negative. The patient has been off all seizure medications now for 2 months. There has been no evidence of seizure recurrence. 2. Complex psychology history (please refer to Dr. Iona [name] note in the E-chart from August 29, 2009) with evidence of anxiety disorder, depression and psychosis. The patient is currently on no medications for this and denies any of these symptoms at present. 3. History of migraines. 4. Remote history of multidrug abuse. 5. Status post wisdom teeth extraction.         History of MRSA skin infection   PAST SURGICAL HISTORY: Past Surgical History:  Procedure Laterality Date  . BIOPSY BREAST  10/14/2010   right needle core biopsy  . BREAST SURGERY  11/16/2010   bilateral mastectomy+ right axillary node dissection,T1cN1a, Her2+,ERPR+  (right breast invasive ductal carcinoma)  . CYSTO WITH HYDRODISTENSION N/A 07/23/2012   Procedure: CYSTOSCOPY/HYDRODISTENSION instillation of marcaine and pyridium;  Surgeon: Reece Packer, MD;  Location: [facility];  Service: Urology;  Laterality: N/A;  INSTILLATION    . I&D EXTREMITY  09/26/2011   Procedure: IRRIGATION AND DEBRIDEMENT EXTREMITY;  Surgeon: Tennis Must, MD;  Location[facility];  Service: Orthopedics;  Laterality: Right;  I&D right hand cat bite wound  . PORTACATH PLACEMENT  11/16/2010   placement of left subclavian port  . TRANSTHORACIC ECHOCARDIOGRAM  05-28-2012   LVF NORMAL/  EF 55-60%  . WISDOM TOOTH EXTRACTION      FAMILY HISTORY Family History  Problem Relation [name] of Onset  . Cancer Maternal Grandfather   . Mental illness Brother   . Mental illness Son   The  patient's mother is living, in her mid 90s, with no history of breast or ovarian cancer. The patient does not know her biological father. The patient has an older brother with bipolar disease, and according to the patient, borderline schizophrenia.   GYNECOLOGIC HISTORY: (Updated January 2015) Menarche at [name] 70. She is G1, P1 with first pregnancy to term at [name] 9. She was not menopausal, and was still having regular periods at the time of her breast cancer diagnosis. She continues on every three-month goserelin.  SOCIAL HISTORY:  (Updated 05/21/2013) She used to work in a [employer] but had to leave that job because of the seizure problem. She's currently unemployed. Her husband, Glendell Docker, works as a Development worker, international aid. Mafalda has a son, Crystal [name], who also lives in [place], currently with the patient's parents.The patient attends a local [place]. Peggyann Juba is pastor.    ADVANCED DIRECTIVES: Not in place  HEALTH MAINTENANCE:  (Updated 05/21/2013) Social History  Substance Use Topics  . Smoking status: Current Every Day Smoker  Packs/day: 1.00    Years: 20.00    Types: Cigarettes  . Smokeless tobacco: Never Used  . Alcohol use Yes     Comment: drinks 3-4 beers per day     Colonoscopy: Never  PAP: July 2012  Bone density:  Never  Lipid panel: Not on file  Allergies  Allergen Reactions  . Tramadol Other (See Comments)    Seizures  . Phenytoin Nausea And Vomiting  . Thorazine [Chlorpromazine Hcl] Hives  . Ciprofloxacin Nausea And Vomiting  . Vancomycin Rash    Possible rash reported by MD    Current Outpatient Prescriptions  Medication Sig Dispense Refill  . ALPRAZolam (XANAX) 1 MG tablet TAKE 1 TABLET BY MOUTH 3 TIMES A DAY AS NEEDED FOR ANXIETY 90 tablet 1  . esomeprazole (NEXIUM) 20 MG capsule Take 20 mg by mouth daily at 12 noon.    . estazolam (PROSOM) 2 MG tablet Take 2 mg by mouth at bedtime.    Marland Kitchen goserelin (ZOLADEX) 10.8 MG injection Inject 10.8 mg into the  skin every 3 (three) months.    . lamoTRIgine (LAMICTAL) 150 MG tablet Take 150 mg by mouth at bedtime.    . triamcinolone cream (KENALOG) 0.1 % Apply 1 application topically 2 (two) times daily. 30 g 0   No current facility-administered medications for this visit.    Facility-Administered Medications Ordered in Other Visits  Medication Dose Route Frequency Provider Last Rate Last Dose  . goserelin (ZOLADEX) injection 10.8 mg  10.8 mg Subcutaneous Q90 days [name], Virgie Dad, MD   10.8 mg at 02/02/16 1501  . goserelin (ZOLADEX) injection 10.8 mg  10.8 mg Subcutaneous Q90 days [name], Virgie Dad, MD       OBJECTIVE: Young white woman Who appears stated [name]  Vitals:   01/03/17 1445  BP: 114/82  Pulse: 70  Resp: 20  Temp: 98.1 F (36.7 C)  SpO2: 100%  ECOG: 2 Body mass index is 17.62 kg/m. Filed Weights   01/03/17 1445  Weight: 105 lb 14.4 oz (48 kg)    Sclerae unicteric, pupils round and equal Oropharynx clear and moist No cervical or supraclavicular adenopathy Lungs no rales or rhonchi Heart regular rate and rhythm Abd soft, nontender, positive bowel sounds MSK no focal spinal tenderness, no upper extremity lymphedema Neuro: nonfocal, well oriented, appropriate affect Breasts: Status post bilateral mastectomies. There is no evidence of chest wall recurrence. Both axillae are benign Skin: She had seborrheic keratoses, very early, on her right leg. She has a small area of eczema in the right chest wall.   LAB RESULTS: Lab Results  Component Value Date   WBC 6.6 01/03/2017   NEUTROABS 4.0 01/03/2017   HGB 14.8 01/03/2017   HCT 41.7 01/03/2017   MCV 95.2 01/03/2017   PLT 204 01/03/2017      Chemistry      Component Value Date/Time   NA 140 01/03/2017 1420   K 3.5 01/03/2017 1420   CL 105 05/17/2016 2002   CL 105 10/11/2012 1441   CO2 25 01/03/2017 1420   BUN 15.2 01/03/2017 1420   CREATININE 0.9 01/03/2017 1420      Component Value Date/Time   CALCIUM 9.7  01/03/2017 1420   ALKPHOS 115 01/03/2017 1420   AST 31 01/03/2017 1420   ALT 27 01/03/2017 1420   BILITOT 0.41 01/03/2017 1420         STUDIES: No results found.   ASSESSMENT: 41 y.o.  [place] woman, known to be BRCA1 and  2 negative,   (1) status post bilateral mastectomies May of 2012 for a right-sided T1c N1 (stage II) grade 3 invasive ductal carcinoma,estrogen and progesterone receptor positive, HER-2 amplified, with an MIB-1 of 85%.   (2) status post carboplatin/ docetaxol/ trastuzumab x6, completed mid December 2012   (3) on trastuzumab every 3 weeks, started August of 2012, interrupted between April and August of 2013, completed 04/27/2012. Most recent echo 08/20/2012  (4) status post radiation therapy, completed 07/12/2011  (5) On tamoxifen starting April 2013, held as of 12/27/2012 with the diagnosis of a left upper extremity DVT.  On therapeutic Coumadin until  May 2015 when port removed and coumadin discontinued  (6) On goserelin given every 3 months, 1st injection on 10/26/11  (7) started on anastrozole November 2014, discontinued January 2015 because of side effects  (8) started letrozole August 2015  Other problems:  (1) Chronic Pain/ fibromyalgia  (2) history of seizure disorder   (3) history of severe migraines   (4)  Severe proteincalorie malnutrition: Continues to be a concern   PLAN:  Milaina is now 6 years out from definitive surgery for her breast cancer with no evidence of disease recurrence. This is very favorable.  We are continuing anti-estrogens to total 10 years. This is, take [place] to 2023.  She was originally tested for the BRCA1 and 2 genes, but has not had the broader panels that we now obtained. I am putting her in for a visit with our genetics counselors to get that done.  We again discussed food issues. It is good that she has added meet back to her diet. It would be better if she added carbohydrates. She understands that if she  continues to lose weight she is going to become quite ill, may need admission, and may need intravenous feedings.  I am going to be checking her thyroid on an every six-month basis. I am going to make sure her cancer has a copy of this note so her medicines may be adjusted as needed.  Esthela will see me again in 6 months. She knows to call for any problems that may develop before then.     01/03/2017     [name], Virgie Dad, MD  01/03/17 3:20 PM Medical Oncology and Hematology [facility] [address] [place], [place] [address] Tel. [phone]    Fax. [phone]  This document serves as a record of services personally performed by Lurline Del, MD. It was created on her behalf by Steva Colder, a trained medical scribe. The creation of this record is based on the scribe's personal observations and the provider's statements to them. This document has been checked and approved by the attending provider.

## 2017-01-03 NOTE — Patient Instructions (Signed)
Goserelin injection What is this medicine? GOSERELIN (GOE se rel in) is similar to a hormone found in the body. It lowers the amount of sex hormones that the body makes. Men will have lower testosterone levels and women will have lower estrogen levels while taking this medicine. In men, this medicine is used to treat prostate cancer; the injection is either given once per month or once every 12 weeks. A once per month injection (only) is used to treat women with endometriosis, dysfunctional uterine bleeding, or advanced breast cancer. This medicine may be used for other purposes; ask your health care provider or pharmacist if you have questions. COMMON BRAND NAME(S): Zoladex What should I tell my health care provider before I take this medicine? They need to know if you have any of these conditions (some only apply to women): -diabetes -heart disease or previous heart attack -high blood pressure -high cholesterol -kidney disease -osteoporosis or low bone density -problems passing urine -spinal cord injury -stroke -tobacco smoker -an unusual or allergic reaction to goserelin, hormone therapy, other medicines, foods, dyes, or preservatives -pregnant or trying to get pregnant -breast-feeding How should I use this medicine? This medicine is for injection under the skin. It is given by a health care professional in a hospital or clinic setting. Men receive this injection once every 4 weeks or once every 12 weeks. Women will only receive the once every 4 weeks injection. Talk to your pediatrician regarding the use of this medicine in children. Special care may be needed. Overdosage: If you think you have taken too much of this medicine contact a poison control center or emergency room at once. NOTE: This medicine is only for you. Do not share this medicine with others. What if I miss a dose? It is important not to miss your dose. Call your doctor or health care professional if you are unable to  keep an appointment. What may interact with this medicine? -female hormones like estrogen -herbal or dietary supplements like black cohosh, chasteberry, or DHEA -female hormones like testosterone -prasterone This list may not describe all possible interactions. Give your health care provider a list of all the medicines, herbs, non-prescription drugs, or dietary supplements you use. Also tell them if you smoke, drink alcohol, or use illegal drugs. Some items may interact with your medicine. What should I watch for while using this medicine? Visit your doctor or health care professional for regular checks on your progress. Your symptoms may appear to get worse during the first weeks of this therapy. Tell your doctor or healthcare professional if your symptoms do not start to get better or if they get worse after this time. Your bones may get weaker if you take this medicine for a long time. If you smoke or frequently drink alcohol you may increase your risk of bone loss. A family history of osteoporosis, chronic use of drugs for seizures (convulsions), or corticosteroids can also increase your risk of bone loss. Talk to your doctor about how to keep your bones strong. This medicine should stop regular monthly menstration in women. Tell your doctor if you continue to menstrate. Women should not become pregnant while taking this medicine or for 12 weeks after stopping this medicine. Women should inform their doctor if they wish to become pregnant or think they might be pregnant. There is a potential for serious side effects to an unborn child. Talk to your health care professional or pharmacist for more information. Do not breast-feed an infant while taking   this medicine. Men should inform their doctors if they wish to father a child. This medicine may lower sperm counts. Talk to your health care professional or pharmacist for more information. What side effects may I notice from receiving this  medicine? Side effects that you should report to your doctor or health care professional as soon as possible: -allergic reactions like skin rash, itching or hives, swelling of the face, lips, or tongue -bone pain -breathing problems -changes in vision -chest pain -feeling faint or lightheaded, falls -fever, chills -pain, swelling, warmth in the leg -pain, tingling, numbness in the hands or feet -signs and symptoms of low blood pressure like dizziness; feeling faint or lightheaded, falls; unusually weak or tired -stomach pain -swelling of the ankles, feet, hands -trouble passing urine or change in the amount of urine -unusually high or low blood pressure -unusually weak or tired Side effects that usually do not require medical attention (report to your doctor or health care professional if they continue or are bothersome): -change in sex drive or performance -changes in breast size in both males and females -changes in emotions or moods -headache -hot flashes -irritation at site where injected -loss of appetite -skin problems like acne, dry skin -vaginal dryness This list may not describe all possible side effects. Call your doctor for medical advice about side effects. You may report side effects to FDA at 1-800-FDA-1088. Where should I keep my medicine? This drug is given in a hospital or clinic and will not be stored at home. NOTE: This sheet is a summary. It may not cover all possible information. If you have questions about this medicine, talk to your doctor, pharmacist, or health care provider.  2015, [employer]. (2013-06-25 11:10:35)  

## 2017-01-04 ENCOUNTER — Telehealth: Payer: Self-pay | Admitting: Oncology

## 2017-01-04 NOTE — Telephone Encounter (Signed)
Spoke with patient regarding her upcoming appts this month.

## 2017-01-11 ENCOUNTER — Telehealth: Payer: Self-pay | Admitting: Genetics

## 2017-01-12 ENCOUNTER — Encounter: Payer: Medicare Other | Admitting: Genetics

## 2017-01-12 ENCOUNTER — Other Ambulatory Visit: Payer: Medicare Other

## 2017-01-12 NOTE — Telephone Encounter (Signed)
Rescheduled genetic counseling appointment for 9/20 at 10:15 AM

## 2017-01-17 ENCOUNTER — Telehealth: Payer: Self-pay | Admitting: Genetics

## 2017-01-17 NOTE — Telephone Encounter (Signed)
Patient called to cancel her appointment this Thursday 9/20.  She has other appointments on that day to attend that conflict.  She is unsure when she can reschedule at this time and will call back to reschedule.

## 2017-01-19 ENCOUNTER — Other Ambulatory Visit: Payer: Medicare Other

## 2017-03-07 ENCOUNTER — Other Ambulatory Visit: Payer: Self-pay | Admitting: Nurse Practitioner

## 2017-04-05 ENCOUNTER — Ambulatory Visit: Payer: Medicare Other

## 2017-06-06 ENCOUNTER — Encounter: Payer: Self-pay | Admitting: *Deleted

## 2017-06-07 ENCOUNTER — Telehealth: Payer: Self-pay | Admitting: *Deleted

## 2017-06-07 NOTE — Telephone Encounter (Signed)
Patient called, states was referred by [facility] and has appt on 07/10/17. She is not sure why. Recently had a pap but was not told anything about results. I reviewed her referral notes which did not have pap results, were labeled with "hx pcos". Patient has never been told she has pcos and has hx of breast cancer, her treatments caused her to go into menopause. She is confused about necessity of this appt. I told patient I will call the [facility] and request additional information, will call her back tomorrow. Patient voiced understanding.

## 2017-06-16 ENCOUNTER — Telehealth: Payer: Self-pay | Admitting: *Deleted

## 2017-06-16 NOTE — Progress Notes (Signed)
This encounter was created in error - please disregard.

## 2017-06-16 NOTE — Telephone Encounter (Signed)
This RN spoke with pt per contact yesterday by the office of provider -Joelene Millin at [facility] stating pt was seen with CXR showing noted concerns-" and pt needs to see Dr Jannifer Rodney asap ".  Records received per above- including CXR.  This RN informed pt per Dr Jannifer Rodney review - CXR results do not show new issues- noted area of " post op changes " - are expected as well as CXR reading was not compared with prior CXR.  This RN discussed above with pt including concern for work up- by [employer] including ongoing bronchial issues now being treated for 7 weeks.  She states her right arm / axilla area is painful- but without swelling, redness or painful to touch.  She is currently being treated with ABX. Probiotic , zofran and pain medication ( hydrocodone apap ).  This RN discussed with pt per MD review - recommendation is to repeat the CXR at [facility] with comparison to prior films.  Crystal [name] verbalized understanding and plan per this office.

## 2017-06-19 ENCOUNTER — Telehealth: Payer: Self-pay | Admitting: *Deleted

## 2017-06-19 ENCOUNTER — Ambulatory Visit (HOSPITAL_COMMUNITY)
Admission: RE | Admit: 2017-06-19 | Discharge: 2017-06-19 | Disposition: A | Discharge status: Home / Self Care | Payer: Medicare Other | Source: Ambulatory Visit | Attending: Oncology | Admitting: Oncology

## 2017-06-19 ENCOUNTER — Other Ambulatory Visit: Payer: Self-pay | Admitting: *Deleted

## 2017-06-19 DIAGNOSIS — Z170 Estrogen receptor positive status [ER+]: Secondary | ICD-10-CM | POA: Diagnosis not present

## 2017-06-19 DIAGNOSIS — G8929 Other chronic pain: Secondary | ICD-10-CM

## 2017-06-19 DIAGNOSIS — C50411 Malignant neoplasm of upper-outer quadrant of right female breast: Secondary | ICD-10-CM

## 2017-06-19 NOTE — Telephone Encounter (Signed)
This RN was contacted by Crystal [name] at [facility] wanting to follow up on plan per call last week concerning CXR and noted area of concern " and she may need an MRI and see the doctor before her scheduled appointment ".  This RN informed Crystal [name] per review of reading - no noted abnormality for recurrent breast cancer stated with reading "post op changes seen " in a young female with bilateral mastectomies with lymph node dissection.  MD recommendation is for pt to complete current course of therapy and then obtain a CXR at next visit 07/04/2017 which can be compared to prior films here ( CXR at [facility] had no comparison ).  Crystal [name] states pt has contacted them stating she is continuing to have discomfort and would like to get an MRI.  Crystal [name] will inform provider at [facility] per above recommendation- and if warranted pt can obtain CXR sooner for comparison reading.  Pt did come in at 5 pm to obtain CXR - order entered by this RN.

## 2017-06-25 ENCOUNTER — Encounter (HOSPITAL_BASED_OUTPATIENT_CLINIC_OR_DEPARTMENT_OTHER): Payer: Self-pay | Admitting: Emergency Medicine

## 2017-06-25 ENCOUNTER — Emergency Department (HOSPITAL_BASED_OUTPATIENT_CLINIC_OR_DEPARTMENT_OTHER)
Admission: EM | Admit: 2017-06-25 | Discharge: 2017-06-25 | Disposition: A | Discharge status: Home / Self Care | Payer: Medicare Other | Attending: Emergency Medicine | Admitting: Emergency Medicine

## 2017-06-25 ENCOUNTER — Other Ambulatory Visit: Payer: Self-pay

## 2017-06-25 DIAGNOSIS — Z853 Personal history of malignant neoplasm of breast: Secondary | ICD-10-CM | POA: Diagnosis not present

## 2017-06-25 DIAGNOSIS — Z79899 Other long term (current) drug therapy: Secondary | ICD-10-CM | POA: Insufficient documentation

## 2017-06-25 DIAGNOSIS — M2550 Pain in unspecified joint: Secondary | ICD-10-CM | POA: Diagnosis not present

## 2017-06-25 DIAGNOSIS — F17210 Nicotine dependence, cigarettes, uncomplicated: Secondary | ICD-10-CM | POA: Insufficient documentation

## 2017-06-25 DIAGNOSIS — M791 Myalgia, unspecified site: Secondary | ICD-10-CM | POA: Diagnosis present

## 2017-06-25 MED ORDER — KETOROLAC TROMETHAMINE 60 MG/2ML IM SOLN
30.0000 mg | Freq: Once | INTRAMUSCULAR | Status: AC
  Administered 2017-06-25: 30 mg via INTRAMUSCULAR
  Filled 2017-06-25: qty 2

## 2017-06-25 MED ORDER — PREDNISONE 20 MG PO TABS: 20.0000 mg | ORAL_TABLET | Freq: Every day | ORAL | 0 refills | Status: DC

## 2017-06-25 MED ORDER — PREDNISONE 20 MG PO TABS
20.0000 mg | ORAL_TABLET | Freq: Once | ORAL | Status: AC
  Administered 2017-06-25: 20 mg via ORAL
  Filled 2017-06-25: qty 1

## 2017-06-25 NOTE — ED Provider Notes (Signed)
[facility] EMERGENCY DEPARTMENT Provider Note   CSN: [id number] Arrival date & time: 06/25/17  1627     History   Chief Complaint Chief Complaint  Patient presents with  . Pain all over    HPI Crystal [name] is a 42 y.o. female.  42 year old female with remote history of breast cancer status post mastectomy and chemo.  She is complaining of diffuse myalgias arthralgias for about 3 months.  She states she got sick with a pneumonia and since then her body is slowly shutting down.  She is seen her doctor and is scheduled to see him again on 5 March.  She has had a chest x-ray recently that did not show any active pulmonary disease.  She is complaining of pain from her shoulders down to her fingertips and from her hips down to her toes.  She has been prescribed Neurontin without any relief.  She denies fever.  She states her nerves feel like they are hypersensitive and that everything is burning.  She rates the pain as 10 out of 10 everywhere.  The history is provided by the patient.    Past Medical History:  Diagnosis Date  . Anxiety   . Bipolar affective disorder, mixed ([place])   . Cancer ([place])   . Depression   . Family history of anesthesia complication    MOTHER PONV  . Frequency of urination   . GERD (gastroesophageal reflux disease)    OCCASIONALLY TAKE ZANTAC  . Heart palpitations   . History of breast cancer DX JUNE 2012 W/ RIGHT BREAST INVASIVE DUCTAL CA  STAGE IIB---  S/P BILATERAL MASTECTOMIES AND RIGHT NODE DISSECTION   ONCOLOGIST-- DR Jana Hakim--  CHEMO ENDED [place] 2012 / RADIATION ENDED MAR 2013--  CURRENT ON TAMOXIFEN AND ZOLADEX  . History of idiopathic seizure    DX 2008 --  LAST ONE 2012--  NO ISSUES SINCE AND NO MEDS. -- PT STATES DOCTORS FELT IT WAS STRESS RELATED DUE TO HEALTH ISSUES  . Hot flashes due to tamoxifen   . Hx MRSA infection 03/28/11   Skin  . Left ovarian cyst   . Nocturia   . Right lower quadrant abdominal pain   . Seizures ([place])   .  Status post chemotherapy    docetaxel/carboplatin/trastuzumab  . Strains to urinate   . Urgency of urination     Patient Active Problem List   Diagnosis Date Noted  . Anorexia nervosa 08/19/2014  . Back pain 08/19/2014  . Malnutrition, calorie ([place]) 12/03/2013  . Migraines 05/21/2013  . Breast cancer of upper-outer quadrant of right female breast ([place]) 03/21/2013  . Chronic pain 01/09/2013  . DVT of upper extremity (deep vein thrombosis) ([place]) 01/02/2013  . Arm edema 12/27/2012  . Neuropathic pain 12/27/2012  . Chronic female pelvic pain 08/21/2012  . Fatigue 08/20/2012  . Chemotherapy induced cardiomyopathy ([place]) 09/08/2011  . Generalized convulsive seizure ([place]) 07/13/2011  . Seizure disorder ([place]) 07/13/2011  . Migraine 07/13/2011  . Anxiety disorder 07/13/2011  . Depression 07/13/2011  . Hx MRSA infection 04/13/2011    Past Surgical History:  Procedure Laterality Date  . BIOPSY BREAST  10/14/2010   right needle core biopsy  . BREAST SURGERY  11/16/2010   bilateral mastectomy+ right axillary node dissection,T1cN1a, Her2+,ERPR+  (right breast invasive ductal carcinoma)  . CYSTO WITH HYDRODISTENSION N/A 07/23/2012   Procedure: CYSTOSCOPY/HYDRODISTENSION instillation of marcaine and pyridium;  Surgeon: Reece Packer, MD;  Location: [facility];  Service: Urology;  Laterality: N/A;  INSTILLATION    . I&D EXTREMITY  09/26/2011   Procedure: IRRIGATION AND DEBRIDEMENT EXTREMITY;  Surgeon: Tennis Must, MD;  [facility];  Service: Orthopedics;  Laterality: Right;  I&D right hand cat bite wound  . PORTACATH PLACEMENT  11/16/2010   placement of left subclavian port  . TRANSTHORACIC ECHOCARDIOGRAM  05-28-2012   LVF NORMAL/  EF 55-60%  . WISDOM TOOTH EXTRACTION      OB History    Gravida Para Term Preterm AB Living   _0 0 0 1   SAB TAB Ectopic Multiple Live Births   0 0 0 0         Home Medications    Prior to Admission medications     Medication Sig Start Date End Date Taking? Authorizing Provider  ALPRAZolam Duanne Moron) 1 MG tablet TAKE 1 TABLET BY MOUTH 3 TIMES A DAY AS NEEDED FOR ANXIETY 05/12/15  Yes Ezekiel Slocumb, PA-C  esomeprazole (NEXIUM) 20 MG capsule Take 20 mg by mouth daily at 12 noon.   Yes [provider]  gabapentin (NEURONTIN) 800 MG tablet Take 800 mg by mouth 3 (three) times daily.   Yes [provider]  goserelin (ZOLADEX) 10.8 MG injection Inject 10.8 mg into the skin every 3 (three) months.   Yes [provider]  estazolam (PROSOM) 2 MG tablet Take 2 mg by mouth at bedtime.    [provider]  lamoTRIgine (LAMICTAL) 150 MG tablet Take 150 mg by mouth at bedtime.    [provider]  triamcinolone cream (KENALOG) 0.1 % Apply 1 application topically 2 (two) times daily. 07/06/16   McVey, Gelene Mink, PA-C    Family History Family History  Problem Relation Age of Onset  . Cancer Maternal Grandfather   . Mental illness Brother   . Mental illness Son     Social History Social History   Tobacco Use  . Smoking status: Current Every Day Smoker    Packs/day: 1.00    Years: 20.00    Pack years: 20.00    Types: Cigarettes  . Smokeless tobacco: Never Used  Substance Use Topics  . Alcohol use: Yes    Comment: drinks 3-4 beers per day  . Drug use: No    Comment: LAST USED COCAINE AND MARIJIUANA 2011     Allergies   Tramadol; Phenytoin; Thorazine [chlorpromazine hcl]; Ciprofloxacin; and Vancomycin   Review of Systems Review of Systems  Constitutional: Negative for fever.  HENT: Negative for sore throat.   Respiratory: Negative for shortness of breath.   Cardiovascular: Negative for chest pain.  Gastrointestinal: Negative for abdominal pain.  Genitourinary: Negative for dysuria.  Musculoskeletal: Positive for arthralgias and myalgias. Negative for joint swelling.  Skin: Negative for rash.     Physical Exam Updated Vital Signs BP 121/87 (BP  Location: Left Arm)   Pulse 90   Temp 98 F (36.7 C) (Oral)   Resp 18   Ht _1  (1.626 m)   Wt 49.9 kg (110 lb)   SpO2 100%   BMI 18.88 kg/m   Physical Exam  Constitutional: Vital signs are normal. She appears well-developed. She appears cachectic. No distress.  HENT:  Head: Normocephalic and atraumatic.  Eyes: Conjunctivae are normal.  Neck: Neck supple.  Cardiovascular: Normal rate and regular rhythm.  No murmur heard. Pulmonary/Chest: Effort normal and breath sounds normal. No respiratory distress.  Abdominal: Soft. There is no tenderness.  Musculoskeletal: She exhibits no edema.  Neurological: She is alert.  Skin: Skin is warm and dry.  Psychiatric: She has a normal mood and affect.  Nursing note and vitals reviewed.    ED Treatments / Results  Labs (all labs ordered are listed, but only abnormal results are displayed) Labs Reviewed - No data to display  EKG  EKG Interpretation None       Radiology No results found.  Procedures Procedures (including critical care time)  Medications Ordered in ED Medications  predniSONE (DELTASONE) tablet 20 mg (not administered)  ketorolac (TORADOL) injection 30 mg (not administered)     Initial Impression / Assessment and Plan / ED Course  I have reviewed the triage vital signs and the nursing notes.  Pertinent labs & imaging results that were available during my care of the patient were reviewed by me and considered in my medical decision making (see chart for details).  Clinical Course as of Jun 26 1657  Sun Jun 25, 2017  1945 I evaluated the patient and attempted to lower expectations for coming up with an answer for the symptoms.  She understands she needs to follow-up with her doctor.  I did offer her screening blood work to see if I could find an answer to intervene on.  Ultimately she declined any kind of workup and asked for a shot of Toradol and some prednisone to go home with and will keep her appointment  with her doctor.  I mentioned consideration for rheumatology and she asked me if we could give her some information for them.  [MB]    Clinical Course User Index [MB] Hayden Rasmussen, MD      Final Clinical Impressions(s) / ED Diagnoses   Final diagnoses:  Arthralgia of multiple sites    ED Discharge Orders    None       Hayden Rasmussen, MD 06/26/17 1701

## 2017-06-25 NOTE — ED Triage Notes (Signed)
Pt reports pain all over, states it feels like her nerves are hypersensitive x 2 months.

## 2017-06-25 NOTE — Discharge Instructions (Signed)
You were evaluated in the emergency department for 10 weeks of progressive pain in your arms and legs.  You were offered blood work for some baseline testing, but you decided you would rather just follow-up with your doctor as scheduled.  We have given you a shot of Toradol and some prednisone and I am prescribing a prednisone to continue until you follow-up with your doctor.  We are also providing you the name of the rheumatology group that you can set up an appointment with

## 2017-07-03 ENCOUNTER — Telehealth: Payer: Self-pay | Admitting: *Deleted

## 2017-07-03 NOTE — Progress Notes (Signed)
ID: Crystal [name]   DOB: November 06, 1975  MR#: [id number]  QPY#:[id number]  TIW:[id number], Gay Filler, MD GYN: Kasandra KnudsenLorine Bears, MD OTHER: Leigh Aurora, Rupinder Toy [facility]   CHIEF COMPLAINT:  Hx of Right Breast Cancer  CURRENT THERAPY: Observation   BREAST CANCER HISTORY: From the initial intake note:  "The patient is a 42 year old [name] who noted a mass in her right breast and brought it to her primary care physician's attention June of 2012. She was set up for mammography at [facility], where an area of calcifications highly suspicious for carcinoma was biopsied. This was in the upper outer quadrant and it measured 1.7 cm mammographically and 1.8 cm by ultrasonography. The pathology report ([id number]) showed a high-grade invasive ductal carcinoma estrogen receptor positive at 64% progesterone receptor positive at 18%, and HER-2 amplified with a ratio by CISH of 5.24. MIB-1-1 was 65%. A second mass biopsied at the same time was also triple positive.  Given her multiple masses in the right breast, mastectomy was recommended. The patient actually opted for bilateral mastectomies and this was performed together with a right axillary lymph node dissection under Dr. Star Age on 11/16/2010. The final pathology [id number]) showed, on the left, no evidence of cancer. On the right the patient had a high-grade invasive ductal carcinoma measuring 1.9 cm, with negative margins, grade 3, involving 2 of 16 lymph nodes (stage IIB)."  Her subsequent history is as detailed below.  INTERVAL HISTORY:  Crystal [name] returns today for followup of her breast cancer. She reports that she has been off of letrozole and goserelin for approximately 6 months.  She simply wanted to be off as many medications as possible.  She noted no significant difference after going off the medications.   REVIEW OF SYSTEMS: Crystal [name] notes that she has had sickness in her household recently. She has right arm pain, numbness, tingling, and burning  sensation. She notes that these symptoms have since travelled to her BLE and she reports that it has caused her to drop things. She notes that she was unable to hold 5 lb weights recently. She reports that when she was sick recently her symptoms worsened and she went to [facility] for evaluation of this on 06/25/2017 and she was referred to a rheumatologist and treated with toradol and prednisone. She reports that she has a pimple-like rash to her right chest wall since 2018 and she has treated with hydrocortisone cream. She has intermittently been on prednisone medication. She has had mild blurred visions. She reports migraines. She has had an increase in appetite. She lives at home with her husband and her mother-in-law. She denies unusual headaches, visual changes, nausea, vomiting, or dizziness. There has been no unusual cough, phlegm production, or pleurisy. This been no change in bowel or bladder habits. She denies unexplained fatigue or unexplained weight loss, bleeding, rash, or fever. A detailed review of systems was otherwise stable.    PAST MEDICAL HISTORY: Past Medical History:  Diagnosis Date  . Anxiety   . Bipolar affective disorder, mixed ([place])   . Cancer ([place])   . Depression   . Family history of anesthesia complication    MOTHER PONV  . Frequency of urination   . GERD (gastroesophageal reflux disease)    OCCASIONALLY TAKE ZANTAC  . Heart palpitations   . History of breast cancer DX JUNE 2012 W/ RIGHT BREAST INVASIVE DUCTAL CA  STAGE IIB---  S/P BILATERAL MASTECTOMIES AND RIGHT NODE DISSECTION   ONCOLOGIST-- DR Jana Hakim--  CHEMO  ENDED DEC 2012 / RADIATION ENDED MAR 2013--  CURRENT ON TAMOXIFEN AND ZOLADEX  . History of idiopathic seizure    DX 2008 --  LAST ONE 2012--  NO ISSUES SINCE AND NO MEDS. -- PT STATES DOCTORS FELT IT WAS STRESS RELATED DUE TO HEALTH ISSUES  . Hot flashes due to tamoxifen   . Hx MRSA infection 03/28/11   Skin  . Left ovarian cyst   . Nocturia   . Right  lower quadrant abdominal pain   . Seizures ([place])   . Status post chemotherapy    docetaxel/carboplatin/trastuzumab  . Strains to urinate   . Urgency of urination   1. History of seizures. The patient has been thoroughly evaluated both here by Dr. Gaynell Face and at [facility] for this. Among many studies, she had an MRI of the brain on August 28th. This showed an incidental solitary small lesion in the left frontoparietal white matter. It was not felt to be suggestive of multiple sclerosis. A noncontrasted CT in August 2010 was negative. The patient has been off all seizure medications now for 2 months. There has been no evidence of seizure recurrence. 2. Complex psychology history (please refer to Dr. Iona Coach note in the E-chart from August 29, 2009) with evidence of anxiety disorder, depression and psychosis. The patient is currently on no medications for this and denies any of these symptoms at present. 3. History of migraines. 4. Remote history of multidrug abuse. 5. Status post wisdom teeth extraction.         History of MRSA skin infection   PAST SURGICAL HISTORY: Past Surgical History:  Procedure Laterality Date  . BIOPSY BREAST  10/14/2010   right needle core biopsy  . BREAST SURGERY  11/16/2010   bilateral mastectomy+ right axillary node dissection,T1cN1a, Her2+,ERPR+  (right breast invasive ductal carcinoma)  . CYSTO WITH HYDRODISTENSION N/A 07/23/2012   Procedure: CYSTOSCOPY/HYDRODISTENSION instillation of marcaine and pyridium;  Surgeon: Reece Packer, MD;  Location: [facility];  Service: Urology;  Laterality: N/A;  INSTILLATION    . I&D EXTREMITY  09/26/2011   Procedure: IRRIGATION AND DEBRIDEMENT EXTREMITY;  Surgeon: Tennis Must, MD;  Location: [facility];  Service: Orthopedics;  Laterality: Right;  I&D right hand cat bite wound  . PORTACATH PLACEMENT  11/16/2010   placement of left subclavian port  . TRANSTHORACIC ECHOCARDIOGRAM  05-28-2012   LVF NORMAL/  EF  55-60%  . WISDOM TOOTH EXTRACTION      FAMILY HISTORY Family History  Problem Relation Age of Onset  . Cancer Maternal Grandfather   . Mental illness Brother   . Mental illness Son   The patient's mother is living, in her mid 71s, with no history of breast or ovarian cancer. The patient does not know her biological father. The patient has an older brother with bipolar disease, and according to the patient, borderline schizophrenia.   GYNECOLOGIC HISTORY: (Updated January 2015) Menarche at age 12. She is G1, P1 with first pregnancy to term at age 23. She was not menopausal, and was still having regular periods at the time of her breast cancer diagnosis. She continues on every three-month goserelin.  SOCIAL HISTORY:  (Updated 05/21/2013) She used to work in a [employer] but had to leave that job because of the seizure problem. She's currently unemployed. Her husband, Glendell Docker, works as a Development worker, international aid. Majesty has a son, Liane Comber, who also lives in [place], currently with the patient's parents.The patient attends a local [employer]. Peggyann Juba  is Theme park manager.    ADVANCED DIRECTIVES: Not in place  HEALTH MAINTENANCE:  (Updated 05/21/2013) Social History   Tobacco Use  . Smoking status: Current Every Day Smoker    Packs/day: 1.00    Years: 20.00    Pack years: 20.00    Types: Cigarettes  . Smokeless tobacco: Never Used  Substance Use Topics  . Alcohol use: Yes    Comment: drinks 3-4 beers per day  . Drug use: No    Comment: LAST USED COCAINE AND MARIJIUANA 2011     Colonoscopy: Never  PAP: July 2012  Bone density:  Never  Lipid panel: Not on file  Allergies  Allergen Reactions  . Tramadol Other (See Comments)    Seizures  . Phenytoin Nausea And Vomiting  . Thorazine [Chlorpromazine Hcl] Hives  . Ciprofloxacin Nausea And Vomiting  . Vancomycin Rash    Possible rash reported by MD    Current Outpatient Medications  Medication Sig Dispense Refill  . ALPRAZolam  (XANAX) 1 MG tablet TAKE 1 TABLET BY MOUTH 3 TIMES A DAY AS NEEDED FOR ANXIETY 90 tablet 1  . Cariprazine HCl (VRAYLAR PO) Take by mouth.    . cyclobenzaprine (FLEXERIL) 10 MG tablet TAKE 1 TABLET BY MOUTH EVERY DAY AT BEDTIME AS NEEDED  0  . esomeprazole (NEXIUM) 20 MG capsule Take 20 mg by mouth daily at 12 noon.    . gabapentin (NEURONTIN) 800 MG tablet Take 800 mg by mouth 3 (three) times daily.    Marland Kitchen triamcinolone cream (KENALOG) 0.1 % Apply 1 application topically 2 (two) times daily. 30 g 0  . goserelin (ZOLADEX) 10.8 MG injection Inject 10.8 mg into the skin every 3 (three) months.    . predniSONE (DELTASONE) 5 MG tablet Take 1 tablet (5 mg total) by mouth daily with breakfast. 30 tablet 0   No current facility-administered medications for this visit.    Facility-Administered Medications Ordered in Other Visits  Medication Dose Route Frequency Provider Last Rate Last Dose  . goserelin (ZOLADEX) injection 10.8 mg  10.8 mg Subcutaneous Q90 days Aftan Vint, Virgie Dad, MD   10.8 mg at 02/02/16 1501   OBJECTIVE: Young white woman who appears stated age  42:   07/04/17 1352  BP: (!) 119/97  Pulse: (!) 108  Resp: 18  Temp: 98 F (36.7 C)  SpO2: 100%  ECOG: 2 Body mass index is 18.88 kg/m. Filed Weights   07/04/17 1352  Weight: 110 lb (49.9 kg)    Sclerae unicteric, pupils round and equal Oropharynx clear and moist No cervical or supraclavicular adenopathy Lungs no rales or rhonchi Heart regular rate and rhythm Abd soft, nontender, positive bowel sounds MSK no focal spinal tenderness, no upper extremity lymphedema, no obvious hand joint edema Neuro: nonfocal, well oriented, appropriate affect Breasts: Status post bilateral mastectomies.  No evidence of chest wall recurrence.  Both axillae are benign    LAB RESULTS: Lab Results  Component Value Date   WBC 6.6 01/03/2017   NEUTROABS 4.0 01/03/2017   HGB 14.8 01/03/2017   HCT 41.7 01/03/2017   MCV 95.2 01/03/2017    PLT 204 01/03/2017      Chemistry      Component Value Date/Time   NA 140 01/03/2017 1420   K 3.5 01/03/2017 1420   CL 105 05/17/2016 2002   CL 105 10/11/2012 1441   CO2 25 01/03/2017 1420   BUN 15.2 01/03/2017 1420   CREATININE 0.9 01/03/2017 1420      Component  Value Date/Time   CALCIUM 9.7 01/03/2017 1420   ALKPHOS 115 01/03/2017 1420   AST 31 01/03/2017 1420   ALT 27 01/03/2017 1420   BILITOT 0.41 01/03/2017 1420         STUDIES: Dg Chest 2 View  Result Date: 06/20/2017 CLINICAL DATA:  Discomfort to bilateral axilla status post bilateral mastectomy EXAM: CHEST  2 VIEW COMPARISON:  CT chest dated 06/17/2012 FINDINGS: Lungs are clear.  No pleural effusion or pneumothorax. The heart is normal in size. Visualized osseous structures are within normal limits. Status post bilateral mastectomy with right axillary lymph node dissection. IMPRESSION: No evidence of acute cardiopulmonary disease. Electronically Signed   By: Julian Hy M.D.   On: 06/20/2017 08:35     ASSESSMENT: 42 y.o.  [place] woman, known to be BRCA1 and 2 negative,   (1) status post bilateral mastectomies May of 2012 for a right-sided T1c N1 (stage II) grade 3 invasive ductal carcinoma,estrogen and progesterone receptor positive, HER-2 amplified, with an MIB-1 of 85%.   (2) status post carboplatin/ docetaxol/ trastuzumab x6, completed mid December 2012   (3) on trastuzumab every 3 weeks, started August of 2012, interrupted between April and August of 2013, completed 04/27/2012. Most recent echo 08/20/2012  (4) status post radiation therapy, completed 07/12/2011  (5) On tamoxifen starting April 2013, held as of 12/27/2012 with the diagnosis of a left upper extremity DVT.  On therapeutic Coumadin until  May 2015 when port removed and coumadin discontinued  (6) On goserelin given every 3 months, 1st injection on 10/26/11, discontinued June 2018 at patient's discretion  (7) started on anastrozole  November 2014, discontinued January 2015 because of side effects  (8) started letrozole August 2015, discontinued March 2018 at patient's discretion  Other problems:  (1) Chronic Pain/ fibromyalgia  (2) history of seizure disorder   (3) history of severe migraines   (4)  Severe proteincalorie malnutrition: resolved   PLAN:  Marigrace is now 6-1/2 years out from definitive surgery for her breast cancer with no evidence of disease recurrence.  This is very favorable.  I do not know if she is having a flareup of fibromyalgia, if she is developing a rheumatologic issue, if she is depressed, or if something else is going on.  What I do not think is going on is recurrent metastatic breast cancer since her lab work and chest x-ray are entirely normal  She has been referred to rheumatology.  While waiting for that visit I am not uncomfortable with her being on 5 mg of prednisone daily.  I wrote her the prescription.  She has no refills however as I would not want her to be on this medication indefinitely without a specific rheumatologic diagnosis.  I am obtaining plain films of her neck today because possibly she could have a disc problem there.  I am also checking an ANA and ESR in case those are informative.  Finally, since she has been off go Sharl Ma now 9 months and has not had a period, I am going to check an [facility] and estradiol level  As far as follow-up is concerned I will see her in 1 year.  She knows to call for any other issues that may develop before then.  Aliyanah Rozas, Virgie Dad, MD  07/04/17 2:33 PM Medical Oncology and Hematology [facility] [address] [place], [place] [address] Tel. [phone]    Fax. [phone]    This document serves as a record of services personally performed  by Lurline Del, MD. It was created on his behalf by [facility], a trained medical scribe. The creation of this record is based on the scribe's personal observations and the  provider's statements to them.   I have reviewed the above documentation for accuracy and completeness, and I agree with the above.

## 2017-07-03 NOTE — Telephone Encounter (Signed)
Provided tomorrow's appointment information upon receipt of patient call.  Questions being "told chest xray scheduled for 1:00 pm to compare lymph nodes from previous xray."  This nurse left message for collaborative.  Advised patient no appointment needed for chest xray,  Expect a call to clarify need dopr chest xray.  "I finished the prescribed antibiotics ."

## 2017-07-03 NOTE — Telephone Encounter (Signed)
Received VM from triage nurse regarding patient wants to know if she has a chest xray ordered for tomorrow at 1 pm?   called pt and let her know that a chest xray has not been ordered but if Dr Jana Hakim wants one tomorrow, he will order One after he sees her at her scheduled appt. Reminded her of tomorrow's appt - register at 1:45 for lab first then MD appt at 2:30,  She voiced understanding.

## 2017-07-04 ENCOUNTER — Inpatient Hospital Stay: Payer: Medicare Other | Attending: Oncology

## 2017-07-04 ENCOUNTER — Ambulatory Visit: Payer: Medicare Other

## 2017-07-04 ENCOUNTER — Telehealth: Payer: Self-pay | Admitting: Oncology

## 2017-07-04 ENCOUNTER — Inpatient Hospital Stay (HOSPITAL_BASED_OUTPATIENT_CLINIC_OR_DEPARTMENT_OTHER): Payer: Medicare Other | Admitting: Oncology

## 2017-07-04 VITALS — BP 119/97 | HR 108 | Temp 98.0°F | Resp 18 | Ht 64.0 in | Wt 110.0 lb

## 2017-07-04 DIAGNOSIS — Z9013 Acquired absence of bilateral breasts and nipples: Secondary | ICD-10-CM | POA: Diagnosis not present

## 2017-07-04 DIAGNOSIS — Z170 Estrogen receptor positive status [ER+]: Secondary | ICD-10-CM | POA: Insufficient documentation

## 2017-07-04 DIAGNOSIS — F17210 Nicotine dependence, cigarettes, uncomplicated: Secondary | ICD-10-CM | POA: Diagnosis not present

## 2017-07-04 DIAGNOSIS — Z9012 Acquired absence of left breast and nipple: Secondary | ICD-10-CM

## 2017-07-04 DIAGNOSIS — Z923 Personal history of irradiation: Secondary | ICD-10-CM | POA: Insufficient documentation

## 2017-07-04 DIAGNOSIS — Z86718 Personal history of other venous thrombosis and embolism: Secondary | ICD-10-CM | POA: Insufficient documentation

## 2017-07-04 DIAGNOSIS — Z853 Personal history of malignant neoplasm of breast: Secondary | ICD-10-CM

## 2017-07-04 DIAGNOSIS — C50411 Malignant neoplasm of upper-outer quadrant of right female breast: Secondary | ICD-10-CM

## 2017-07-04 LAB — COMPREHENSIVE METABOLIC PANEL
ALBUMIN: 4.1 g/dL (ref 3.5–5.0)
ALK PHOS: 81 U/L (ref 40–150)
ALT: 32 U/L (ref 0–55)
AST: 21 U/L (ref 5–34)
Anion gap: 10 (ref 3–11)
BILIRUBIN TOTAL: 0.5 mg/dL (ref 0.2–1.2)
BUN: 15 mg/dL (ref 7–26)
CO2: 24 mmol/L (ref 22–29)
Calcium: 9.8 mg/dL (ref 8.4–10.4)
Chloride: 108 mmol/L (ref 98–109)
Creatinine, Ser: 0.99 mg/dL (ref 0.60–1.10)
GFR calc Af Amer: >60 mL/min (ref >60)
GFR calc non Af Amer: >60 mL/min (ref >60)
Glucose, Bld: 86 mg/dL (ref 70–140)
POTASSIUM: 4.1 mmol/L (ref 3.5–5.1)
SODIUM: 142 mmol/L (ref 136–145)
TOTAL PROTEIN: 6.9 g/dL (ref 6.4–8.3)

## 2017-07-04 LAB — CBC WITH DIFFERENTIAL/PLATELET
BASOS PCT: 0 %
Basophils Absolute: 0 10*3/uL (ref 0.0–0.1)
Eosinophils Absolute: 0.2 10*3/uL (ref 0.0–0.5)
Eosinophils Relative: 2 %
HCT: 43.9 % (ref 34.8–46.6)
HEMOGLOBIN: 15.4 g/dL (ref 11.6–15.9)
LYMPHS ABS: 2.4 10*3/uL (ref 0.9–3.3)
Lymphocytes Relative: 27 %
MCH: 33 pg (ref 25.1–34.0)
MCHC: 35.1 g/dL (ref 31.5–36.0)
MCV: 94.2 fL (ref 79.5–101.0)
Monocytes Absolute: 0.4 10*3/uL (ref 0.1–0.9)
Monocytes Relative: 5 %
NEUTROS PCT: 66 %
Neutro Abs: 6 10*3/uL (ref 1.5–6.5)
Platelets: 189 10*3/uL (ref 145–400)
RBC: 4.66 MIL/uL (ref 3.70–5.45)
RDW: 13.2 % (ref 11.2–14.5)
WBC: 9 10*3/uL (ref 3.9–10.3)

## 2017-07-04 MED ORDER — PREDNISONE 5 MG PO TABS
5.0000 mg | ORAL_TABLET | Freq: Every day | ORAL | 0 refills | Status: DC
Start: 2017-07-04 — End: 2018-05-28

## 2017-07-04 NOTE — Telephone Encounter (Signed)
Gave avs and calendar for march °

## 2017-07-05 ENCOUNTER — Encounter: Payer: Self-pay | Admitting: Oncology

## 2017-07-05 LAB — THYROID PANEL WITH TSH
Free Thyroxine Index: 1.6 (ref 1.2–4.9)
T3 Uptake Ratio: 26 % (ref 24–39)
T4 TOTAL: 6.1 ug/dL (ref 4.5–12.0)
TSH: 2.13 u[IU]/mL (ref 0.450–4.500)

## 2017-07-07 ENCOUNTER — Inpatient Hospital Stay: Payer: Medicare Other

## 2017-07-07 DIAGNOSIS — Z853 Personal history of malignant neoplasm of breast: Secondary | ICD-10-CM | POA: Diagnosis not present

## 2017-07-07 DIAGNOSIS — C50411 Malignant neoplasm of upper-outer quadrant of right female breast: Secondary | ICD-10-CM

## 2017-07-07 LAB — SEDIMENTATION RATE: Sed Rate: 2 mm/hr (ref 0–22)

## 2017-07-08 LAB — FOLLICLE STIMULATING HORMONE: FSH: 4.3 m[IU]/mL

## 2017-07-10 ENCOUNTER — Ambulatory Visit (INDEPENDENT_AMBULATORY_CARE_PROVIDER_SITE_OTHER): Payer: Medicare Other | Admitting: Obstetrics & Gynecology

## 2017-07-10 ENCOUNTER — Encounter: Payer: Self-pay | Admitting: Obstetrics & Gynecology

## 2017-07-10 VITALS — BP 120/81 | HR 88 | Ht 64.0 in | Wt 109.1 lb

## 2017-07-10 DIAGNOSIS — R6882 Decreased libido: Secondary | ICD-10-CM | POA: Diagnosis not present

## 2017-07-10 LAB — ANTINUCLEAR ANTIBODIES, IFA: ANTINUCLEAR ANTIBODIES, IFA: NEGATIVE

## 2017-07-10 NOTE — Progress Notes (Signed)
History:  42 y.o. G1P1001 pt is unsure why she had an appt. She denies ever having a h/o PCOS. She reports amenorrhea due to chemo. She was managed for breast cancer with a bilateral mastectomy and chemo. She does report decreased libido but, says that she had this prior to the chemo.   The following portions of the patient's history were reviewed and updated as appropriate: allergies, current medications, past family history, past medical history, past social history, past surgical history and problem list.  Review of Systems:  Pertinent items are noted in HPI.   Objective:  Physical Exam Blood pressure 120/81, pulse 88, height 5\' 4"  (1.626 m), weight 109 lb 1.6 oz (49.5 kg).  CONSTITUTIONAL: Well-developed, well-nourished female in no acute distress.  HENT:  Normocephalic, atraumatic EYES: Conjunctivae and EOM are normal. No scleral icterus.  NECK: Normal range of motion SKIN: Skin is warm and dry. No rash noted. Not diaphoretic.No pallor. [place]: Alert and oriented to person, place, and time. Normal coordination.   Labs and Imaging Dg Chest 2 View  Result Date: 06/20/2017 CLINICAL DATA:  Discomfort to bilateral axilla status post bilateral mastectomy EXAM: CHEST  2 VIEW COMPARISON:  CT chest dated 06/17/2012 FINDINGS: Lungs are clear.  No pleural effusion or pneumothorax. The heart is normal in size. Visualized osseous structures are within normal limits. Status post bilateral mastectomy with right axillary lymph node dissection. IMPRESSION: No evidence of acute cardiopulmonary disease. Electronically Signed   By: Julian Hy M.D.   On: 06/20/2017 08:35    Assessment & Plan:  Decreased libido- probably exacerbated by surgical menopause. Pt is not a candidate for hormonal treatments due to her h/o breast cancer.  I mentioned  Addyi HOWEVER, I will need to review her meds and check with her provider to see if this is an acceptable med for her.  Pt will follow up for further eval.  I alos dont know if it is covered by her insurance.   I gave pt recommendations about gels and vibrators that might help.  She will try those in the meantime  Total face-to-face time with patient was 20 min.  Greater than 50% was spent in counseling and coordination of care with the patient.   Grant Henkes L. Harraway-Smith, M.D., [place]   Check meds to see if any may be causing decreased. libido.

## 2017-07-11 LAB — ESTRADIOL, ULTRA SENS: Estradiol, Sensitive: <2.5 pg/mL

## 2017-07-13 ENCOUNTER — Other Ambulatory Visit: Payer: Self-pay | Admitting: *Deleted

## 2017-07-13 DIAGNOSIS — R102 Pelvic and perineal pain: Principal | ICD-10-CM

## 2017-07-13 DIAGNOSIS — G8929 Other chronic pain: Secondary | ICD-10-CM

## 2017-07-13 DIAGNOSIS — C50411 Malignant neoplasm of upper-outer quadrant of right female breast: Secondary | ICD-10-CM

## 2017-07-14 ENCOUNTER — Encounter: Payer: Self-pay | Admitting: Obstetrics & Gynecology

## 2017-07-19 ENCOUNTER — Other Ambulatory Visit: Payer: Self-pay

## 2017-07-19 ENCOUNTER — Ambulatory Visit: Payer: Medicare Other | Attending: Oncology | Admitting: Physical Therapy

## 2017-07-19 ENCOUNTER — Ambulatory Visit: Payer: Medicare Other | Admitting: Physical Therapy

## 2017-07-19 DIAGNOSIS — M25652 Stiffness of left hip, not elsewhere classified: Secondary | ICD-10-CM

## 2017-07-19 DIAGNOSIS — R252 Cramp and spasm: Secondary | ICD-10-CM | POA: Diagnosis present

## 2017-07-19 DIAGNOSIS — M6281 Muscle weakness (generalized): Secondary | ICD-10-CM | POA: Diagnosis present

## 2017-07-19 NOTE — Patient Instructions (Signed)
Balloon Breath    Place hands LIGHTLY on belly below navel. Imagine a balloon inside belly. Blow up balloon on breath IN, deflate balloon on breath OUT. Contract abdominals slightly to assist breath OUT. Time ___ minutes.  Copyright  VHI. All rights reserved.    Moisturizers . They are used in the vagina to hydrate the mucous membrane that make up the vaginal canal. . Designed to keep a more normal acid balance (ph) . Once placed in the vagina, it will last between two to three days.  . Use 2-3 times per week at bedtime and last longer than 60 min. . Ingredients to avoid is glycerin and fragrance, can increase chance of infection . Should not be used just before sex due to causing irritation . Most are gels administered either in a tampon-shaped applicator or as a vaginal suppository. They are non-hormonal.   Types of Moisturizers . Samul Dada- drug store . Vitamin E vaginal suppositories- Whole foods, Amazon . Moist Again . Coconut oil- can break down condoms . Michail Jewels . Yes moisturizer- amazon . NeuEve Silk , NeuEve Silver for menopausal or over 65 (if have severe vaginal atrophy or cancer treatments use NeuEve Silk for  1 month than move to The Pepsi)- [employer], MapleFlower.dk . Olive and Bee intimate cream- www.oliveandbee.com.au  Creams to use externally on the Vulva area  Albertson's (good for for cancer patients that had radiation to the area)- [place] or [employer].FlyingBasics.com.br  V-magic cream - amazon  Julva-amazon  Vital "V Wild Yam salve ( help moisturize and help with thinning vulvar area, does have [place]   Things to avoid in the vaginal area . Do not use things to irritate the vulvar area . No lotions just specialized creams for the vulva area- Neogyn, V-magic, No soaps; can use Aveeno or Calendula cleanser if needed. Must be gentle . No deodorants . No douches . Good to  sleep without underwear to let the vaginal area to air out . No scrubbing: spread the lips to let warm water rinse over labias and pat dry  [facility] [address], [place], [place] [address] Phone # [phone] Fax [phone]

## 2017-07-20 NOTE — Therapy (Signed)
[facility] [address], [place], [place], [address] Phone: [phone]   Fax:  [phone]  Physical Therapy Evaluation  Patient Details  Name: Crystal [name] MRN: [id number] Date of Birth: June 23, 1975 Referring Provider: Chauncey Cruel   Encounter Date: 07/19/2017  PT End of Session - 07/19/17 1542    Visit Number  1    Date for PT Re-Evaluation  10/11/17    PT Start Time  1638    PT Stop Time  1612    PT Time Calculation (min)  42 min    Activity Tolerance  Patient tolerated treatment well    Behavior During Therapy  [facility] for tasks assessed/performed       Past Medical History:  Diagnosis Date  . Anxiety   . Bipolar affective disorder, mixed ([place])   . Cancer ([place])   . Depression   . Family history of anesthesia complication    MOTHER PONV  . Frequency of urination   . GERD (gastroesophageal reflux disease)    OCCASIONALLY TAKE ZANTAC  . Heart palpitations   . History of breast cancer DX JUNE 2012 W/ RIGHT BREAST INVASIVE DUCTAL CA  STAGE IIB---  S/P BILATERAL MASTECTOMIES AND RIGHT NODE DISSECTION   ONCOLOGIST-- DR Jana Hakim--  CHEMO ENDED [place] 2012 / RADIATION ENDED MAR 2013--  CURRENT ON TAMOXIFEN AND ZOLADEX  . History of idiopathic seizure    DX 2008 --  LAST ONE 2012--  NO ISSUES SINCE AND NO MEDS. -- PT STATES DOCTORS FELT IT WAS STRESS RELATED DUE TO HEALTH ISSUES  . Hot flashes due to tamoxifen   . Hx MRSA infection 03/28/11   Skin  . Left ovarian cyst   . Nocturia   . Right lower quadrant abdominal pain   . Seizures ([place])   . Status post chemotherapy    docetaxel/carboplatin/trastuzumab  . Strains to urinate   . Urgency of urination     Past Surgical History:  Procedure Laterality Date  . BIOPSY BREAST  10/14/2010   right needle core biopsy  . BREAST SURGERY  11/16/2010   bilateral mastectomy+ right axillary node dissection,T1cN1a, Her2+,ERPR+  (right breast invasive ductal carcinoma)   . CYSTO WITH HYDRODISTENSION N/A 07/23/2012   Procedure: CYSTOSCOPY/HYDRODISTENSION instillation of marcaine and pyridium;  Surgeon: Reece Packer, MD;  Location: [facility];  Service: Urology;  Laterality: N/A;  INSTILLATION    . I&D EXTREMITY  09/26/2011   Procedure: IRRIGATION AND DEBRIDEMENT EXTREMITY;  Surgeon: Tennis Must, MD;  Location: [facility];  Service: Orthopedics;  Laterality: Right;  I&D right hand cat bite wound  . PORTACATH PLACEMENT  11/16/2010   placement of left subclavian port  . TRANSTHORACIC ECHOCARDIOGRAM  05-28-2012   LVF NORMAL/  EF 55-60%  . WISDOM TOOTH EXTRACTION      There were no vitals filed for this visit.   Subjective Assessment - 07/19/17 1536    Subjective  Pt states she is unable to get an orgasm during intercourse.  She was having pain but that has subsided since she is not getting shots anymore.    Patient Stated Goals  be able to have intercourse    Currently in Pain?  No/denies         [facility] PT Assessment - 07/20/17 0001      Assessment   Medical Diagnosis  R10.2,G89.29 (ICD-10-CM) - Chronic female pelvic pain; C50.411 (ICD-10-CM) - Malignant neoplasm of upper-outer quadrant of right female breast, unspecified estrogen  receptor status [facility])    Referring Provider  [name], Virgie Dad    Onset Date/Surgical Date  -- one year    Prior Therapy  No      Precautions   Precautions  None      Restrictions   Weight Bearing Restrictions  No      Balance Screen   Has the patient fallen in the past 6 months  Yes    How many times?  1 tripped      [place] residence    Living Arrangements  Spouse/significant other;Other relatives      Prior Function   Level of Independence  Independent      Cognition   Overall Cognitive Status  Within Functional Limits for tasks assessed      Observation/Other Assessments   Focus on Therapeutic Outcomes (FOTO)   25% limited based on clinical  assessment      Posture/Postural Control   Posture/Postural Control  Postural limitations    Postural Limitations  Rounded Shoulders      PROM   Overall PROM Comments  left hip 25% limited IR, ER      Strength   Overall Strength Comments  bilateral hip adduction 4/5 MMT      Palpation   Palpation comment  fascial adhesion in abdomen      Ambulation/Gait   Gait Pattern  Within Functional Limits             Objective measurements completed on examination: See above findings.    Pelvic Floor Special Questions - 07/20/17 0001    Prior Pelvic/Prostate Exam  Yes    Are you Pregnant or attempting pregnancy?  No    Number of Vaginal Deliveries  1    Currently Sexually Active  No unable    Urinary Leakage  No    Urinary urgency  No    Urinary frequency  no    Fecal incontinence  No constipation    Fluid intake  64 oz    Caffeine beverages  pot coffee    Falling out feeling (prolapse)  No    Skin Integrity  -- dryness observed    Pelvic Floor Internal Exam  pt informed and consent given to perform internal soft tissue assessment    Exam Type  Vaginal    Palpation  tight and tender to palpation OI, LA, TP, Coccygeus    Strength  fair squeeze, definite lift    Strength # of reps  2    Strength # of seconds  5       [facility] Adult PT Treatment/Exercise - 07/20/17 0001      Self-Care   Self-Care  Other Self-Care Comments    Other Self-Care Comments   moisturizer, breathing, HEP as seen in chart             PT Education - 07/19/17 1749    Education provided  Yes    Education Details  balloon breathing    Person(s) Educated  Patient    Methods  Explanation;Demonstration;Handout    Comprehension  Verbalized understanding       PT Short Term Goals - 07/19/17 1640      PT SHORT TERM GOAL #1   Title  pt will be ind with initial HEP    Time  4    Period  Weeks    Status  New    Target Date  08/30/17  PT SHORT TERM GOAL #2   Title  pt will be in with  moisturizing routine and perineal skin will be normal and intact without irritations    Time  4    Period  Weeks    Status  New    Target Date  08/30/17        PT Long Term Goals - 07/20/17 1728      PT LONG TERM GOAL #1   Title  Pt will be able to have intercourse with her husband due to improved ability to coordinate pelvic floor contract and relax    Time  12    Period  Weeks    Status  New    Target Date  10/11/17      PT LONG TERM GOAL #2   Title  pt will be able to hold pelvic floor contraction for 8 seconds for improved core stability and maintained intra-abdominal pressure during functional activities    Time  12    Period  Weeks    Status  New    Target Date  10/11/17      PT LONG TERM GOAL #3   Title  Pt will demonstrate ability to contract, relax and bulge pelvic floor for improved toileting and sexual function    Time  12    Period  Weeks    Status  New    Target Date  10/11/17      PT LONG TERM GOAL #4   Title  pt will be able to perform correct toileting techniques in order to have regular bowel movement    Time  12    Period  Weeks    Status  New    Target Date  10/11/17             Plan - 07/19/17 1750    Clinical Impression Statement  Pt presents to clinic due to pelvic floor dysfunction with complications from estrogen blocking  medications.  Pt has constipation and is unable to have an orgasm.  Pt has LE weakness and Lt LE stiff with reduced ROM.  Pt has weakness of plevic floor of 3/5  Pt has tenderness to palpation of bilateral Obdurator internus, levator ani, coccygeus, transverse peroneus.  Perineum and vaginal dryness present.  Pt has fascial restrictions throughout abdominal cavity.  She will benefit from skilled PT to address impairments and return to maximum function with improved quality of life.      History and Personal Factors relevant to plan of care:  estrogen receptor positive breast cancer    Clinical Presentation  Stable     Clinical Presentation due to:  pt is stable    Clinical Decision Making  Low    PT Frequency  1x / week pt may do less due to financial reasons    PT Duration  12 weeks most likely 2-4 visits    PT Treatment/Interventions  ADLs/Self Care Home Management;Biofeedback;Electrical Stimulation;Moist Heat;Therapeutic activities;Therapeutic exercise;Neuromuscular re-education;Manual techniques;Dry needling;Taping    PT Next Visit Plan  review breathing, hip/pelvic floor stretches, toileting technique, abdominal massage    Consulted and Agree with Plan of Care  Patient       Patient will benefit from skilled therapeutic intervention in order to improve the following deficits and impairments:  Decreased strength, Hypomobility, Increased muscle spasms  Visit Diagnosis: Cramp and spasm - Plan: PT plan of care cert/re-cert  Muscle weakness (generalized) - Plan: PT plan of care cert/re-cert  Stiffness of left hip, not  elsewhere classified - Plan: PT plan of care cert/re-cert     Problem List Patient Active Problem List   Diagnosis Date Noted  . Anorexia nervosa 08/19/2014  . Back pain 08/19/2014  . Malnutrition, calorie ([place]) 12/03/2013  . Migraines 05/21/2013  . Breast cancer of upper-outer quadrant of right female breast ([place]) 03/21/2013  . Chronic pain 01/09/2013  . DVT of upper extremity (deep vein thrombosis) ([place]) 01/02/2013  . Arm edema 12/27/2012  . Neuropathic pain 12/27/2012  . Chronic female pelvic pain 08/21/2012  . Fatigue 08/20/2012  . Chemotherapy induced cardiomyopathy ([place]) 09/08/2011  . Generalized convulsive seizure ([place]) 07/13/2011  . Seizure disorder ([place]) 07/13/2011  . Migraine 07/13/2011  . Anxiety disorder 07/13/2011  . Depression 07/13/2011  . Hx MRSA infection 04/13/2011    Zannie Cove, PT 07/20/2017, 5:42 PM  [facility] [address], [place], [place], [address] Phone: [phone]   Fax:   [phone]  Name: ONESHA KREBBS MRN: [id number] Date of Birth: 1975/11/09

## 2017-07-20 NOTE — Telephone Encounter (Signed)
Patient had appointment on 07-10-17. Kathrene Alu RN

## 2017-07-24 ENCOUNTER — Ambulatory Visit: Payer: Medicare Other | Admitting: Physical Therapy

## 2017-07-31 ENCOUNTER — Other Ambulatory Visit: Payer: Self-pay | Admitting: Oncology

## 2017-08-17 ENCOUNTER — Ambulatory Visit: Payer: Medicare Other | Admitting: Obstetrics & Gynecology

## 2017-10-02 ENCOUNTER — Telehealth: Payer: Self-pay | Admitting: Oncology

## 2017-10-02 NOTE — Telephone Encounter (Signed)
Patient called to cancel °

## 2017-10-04 ENCOUNTER — Ambulatory Visit: Payer: Medicare Other

## 2017-11-20 ENCOUNTER — Encounter (HOSPITAL_COMMUNITY): Payer: Self-pay | Admitting: Emergency Medicine

## 2017-11-20 ENCOUNTER — Emergency Department (HOSPITAL_COMMUNITY): Payer: Medicare Other

## 2017-11-20 ENCOUNTER — Emergency Department (HOSPITAL_BASED_OUTPATIENT_CLINIC_OR_DEPARTMENT_OTHER): Payer: Medicare Other

## 2017-11-20 ENCOUNTER — Emergency Department (HOSPITAL_COMMUNITY)
Admission: EM | Admit: 2017-11-20 | Discharge: 2017-11-20 | Disposition: A | Discharge status: Home / Self Care | Payer: Medicare Other | Attending: Emergency Medicine | Admitting: Emergency Medicine

## 2017-11-20 DIAGNOSIS — M79661 Pain in right lower leg: Secondary | ICD-10-CM | POA: Diagnosis present

## 2017-11-20 DIAGNOSIS — W19XXXA Unspecified fall, initial encounter: Secondary | ICD-10-CM | POA: Insufficient documentation

## 2017-11-20 DIAGNOSIS — S82101D Unspecified fracture of upper end of right tibia, subsequent encounter for closed fracture with routine healing: Secondary | ICD-10-CM | POA: Diagnosis not present

## 2017-11-20 DIAGNOSIS — M7989 Other specified soft tissue disorders: Secondary | ICD-10-CM

## 2017-11-20 DIAGNOSIS — Z86718 Personal history of other venous thrombosis and embolism: Secondary | ICD-10-CM | POA: Insufficient documentation

## 2017-11-20 DIAGNOSIS — R208 Other disturbances of skin sensation: Secondary | ICD-10-CM | POA: Diagnosis not present

## 2017-11-20 DIAGNOSIS — Z79899 Other long term (current) drug therapy: Secondary | ICD-10-CM | POA: Diagnosis not present

## 2017-11-20 DIAGNOSIS — M79609 Pain in unspecified limb: Secondary | ICD-10-CM | POA: Diagnosis not present

## 2017-11-20 DIAGNOSIS — F17210 Nicotine dependence, cigarettes, uncomplicated: Secondary | ICD-10-CM | POA: Diagnosis not present

## 2017-11-20 DIAGNOSIS — S82131A Displaced fracture of medial condyle of right tibia, initial encounter for closed fracture: Secondary | ICD-10-CM

## 2017-11-20 MED ORDER — MORPHINE SULFATE (PF) 4 MG/ML IV SOLN
4.0000 mg | Freq: Once | INTRAVENOUS | Status: AC
  Administered 2017-11-20: 4 mg via INTRAVENOUS
  Filled 2017-11-20: qty 1

## 2017-11-20 MED ORDER — OXYCODONE-ACETAMINOPHEN 5-325 MG PO TABS: 1.0000 | ORAL_TABLET | Freq: Four times a day (QID) | ORAL | 0 refills | Status: DC | PRN

## 2017-11-20 MED ORDER — OXYCODONE-ACETAMINOPHEN 5-325 MG PO TABS
1.0000 | ORAL_TABLET | Freq: Once | ORAL | Status: AC
  Administered 2017-11-20: 1 via ORAL
  Filled 2017-11-20: qty 1

## 2017-11-20 NOTE — ED Provider Notes (Signed)
[facility] DEPT Provider Note   CSN: [id number] Arrival date & time: 11/20/17  1314     History   Chief Complaint Chief Complaint  Patient presents with  . Leg Pain    HPI Crystal [name] is a 42 y.o. female presenting with pain, color change and decrease in temperature to her right lower extremity.  Patient [place] when she fell onto her right knee.  Patient states that she was seen in the emergency department at [facility], told that she had a fracture of her right leg but then she left prior to discharge.  Patient states that she contacted an orthopedic office here in [facility] for follow-up however they encouraged her to come to the emergency department today due to her symptoms of cold right foot.  Patient has been treating her pain with Percocet, knee immobilization and crutches.  Of note patient states that she has had a prior left upper extremity DVT in the past, history of breast cancer.  HPI  Past Medical History:  Diagnosis Date  . Anxiety   . Bipolar affective disorder, mixed ([place])   . Cancer ([place])   . Depression   . Family history of anesthesia complication    MOTHER PONV  . Frequency of urination   . GERD (gastroesophageal reflux disease)    OCCASIONALLY TAKE ZANTAC  . Heart palpitations   . History of breast cancer DX JUNE 2012 W/ RIGHT BREAST INVASIVE DUCTAL CA  STAGE IIB---  S/P BILATERAL MASTECTOMIES AND RIGHT NODE DISSECTION   ONCOLOGIST-- DR Jana Hakim--  CHEMO ENDED [place] 2012 / RADIATION ENDED MAR 2013--  CURRENT ON TAMOXIFEN AND ZOLADEX  . History of idiopathic seizure    DX 2008 --  LAST ONE 2012--  NO ISSUES SINCE AND NO MEDS. -- PT STATES DOCTORS FELT IT WAS STRESS RELATED DUE TO HEALTH ISSUES  . Hot flashes due to tamoxifen   . Hx MRSA infection 03/28/11   Skin  . Left ovarian cyst   . Nocturia   . Right lower quadrant abdominal pain   . Seizures ([place])   . Status post chemotherapy      docetaxel/carboplatin/trastuzumab  . Strains to urinate   . Urgency of urination     Patient Active Problem List   Diagnosis Date Noted  . Anorexia nervosa 08/19/2014  . Back pain 08/19/2014  . Malnutrition, calorie ([place]) 12/03/2013  . Migraines 05/21/2013  . Breast cancer of upper-outer quadrant of right female breast ([place]) 03/21/2013  . Chronic pain 01/09/2013  . DVT of upper extremity (deep vein thrombosis) ([place]) 01/02/2013  . Arm edema 12/27/2012  . Neuropathic pain 12/27/2012  . Chronic female pelvic pain 08/21/2012  . Fatigue 08/20/2012  . Chemotherapy induced cardiomyopathy ([place]) 09/08/2011  . Generalized convulsive seizure ([place]) 07/13/2011  . Seizure disorder ([place]) 07/13/2011  . Migraine 07/13/2011  . Anxiety disorder 07/13/2011  . Depression 07/13/2011  . Hx MRSA infection 04/13/2011    Past Surgical History:  Procedure Laterality Date  . BIOPSY BREAST  10/14/2010   right needle core biopsy  . BREAST SURGERY  11/16/2010   bilateral mastectomy+ right axillary node dissection,T1cN1a, Her2+,ERPR+  (right breast invasive ductal carcinoma)  . CYSTO WITH HYDRODISTENSION N/A 07/23/2012   Procedure: CYSTOSCOPY/HYDRODISTENSION instillation of marcaine and pyridium;  Surgeon: Reece Packer, MD;  Location: [facility];  Service: Urology;  Laterality: N/A;  INSTILLATION    . I&D EXTREMITY  09/26/2011  Procedure: IRRIGATION AND DEBRIDEMENT EXTREMITY;  Surgeon: Tennis Must, MD;  Location: [facility];  Service: Orthopedics;  Laterality: Right;  I&D right hand cat bite wound  . PORTACATH PLACEMENT  11/16/2010   placement of left subclavian port  . TRANSTHORACIC ECHOCARDIOGRAM  05-28-2012   LVF NORMAL/  EF 55-60%  . WISDOM TOOTH EXTRACTION       OB History    Gravida  1   Para  1   Term  1   Preterm  0   AB  0   Living  1     SAB  0   TAB  0   Ectopic  0   Multiple  0   Live Births               Home Medications    Prior to  Admission medications   Medication Sig Start Date End Date Taking? Authorizing Provider  albuterol (PROVENTIL HFA;VENTOLIN HFA) 108 (90 Base) MCG/ACT inhaler Inhale 1 puff into the lungs every 4 (four) hours as needed for wheezing or shortness of breath. 10/31/17  Yes [provider]  ALPRAZolam (XANAX) 1 MG tablet TAKE 1 TABLET BY MOUTH 3 TIMES A DAY AS NEEDED FOR ANXIETY Patient taking differently: Take 1 mg by mouth 3 (three) times daily.  05/12/15  Yes Ezekiel Slocumb, PA-C  lamoTRIgine (LAMICTAL) 150 MG tablet Take 300 mg by mouth at bedtime. 11/13/17  Yes [provider]  oxyCODONE-acetaminophen (PERCOCET/ROXICET) 5-325 MG tablet Take 1 tablet by mouth every 6 (six) hours as needed for severe pain. 11/20/17   Deliah Boston, PA-C  predniSONE (DELTASONE) 5 MG tablet Take 1 tablet (5 mg total) by mouth daily with breakfast. Patient not taking: Reported on 11/20/2017 07/04/17   [name], Virgie Dad, MD    Family History Family History  Problem Relation Age of Onset  . Cancer Maternal Grandfather   . Mental illness Brother   . Mental illness Son     Social History Social History   Tobacco Use  . Smoking status: Current Every Day Smoker    Packs/day: 1.00    Years: 20.00    Pack years: 20.00    Types: Cigarettes  . Smokeless tobacco: Never Used  Substance Use Topics  . Alcohol use: Yes    Comment: drinks 3-4 beers per day  . Drug use: No    Comment: LAST USED COCAINE AND MARIJIUANA 2011     Allergies   Tramadol; Phenytoin; Thorazine [chlorpromazine hcl]; Ciprofloxacin; and Vancomycin   Review of Systems Review of Systems  Constitutional: Negative.  Negative for fever.  HENT: Negative.   Eyes: Negative.   Respiratory: Negative.  Negative for cough, chest tightness and shortness of breath.   Cardiovascular: Negative.  Negative for chest pain.  Gastrointestinal: Negative.  Negative for abdominal pain, diarrhea, nausea and vomiting.  Genitourinary: Negative.   Negative for difficulty urinating and hematuria.  Musculoskeletal: Positive for arthralgias, gait problem and joint swelling.  Skin: Positive for color change and pallor.  Neurological: Negative for dizziness, syncope, weakness, light-headedness and headaches.     Physical Exam Updated Vital Signs BP 119/84   Pulse 65   Temp 98.4 F (36.9 C) (Oral)   Resp 18   LMP 11/19/2017   SpO2 100%   Physical Exam  Constitutional: She is oriented to person, place, and time. She appears well-developed and well-nourished. No distress.  HENT:  Head: Normocephalic and atraumatic.  Right Ear: External ear normal.  Left  Ear: External ear normal.  Nose: Nose normal.  Eyes: Pupils are equal, round, and reactive to light. EOM are normal.  Neck: Normal range of motion. Neck supple. No JVD present. No tracheal deviation present.  Cardiovascular: Normal rate, regular rhythm, normal heart sounds and intact distal pulses.  Pulses:      Dorsalis pedis pulses are 3+ on the right side, and 3+ on the left side.       Posterior tibial pulses are 3+ on the right side, and 3+ on the left side.  Pulmonary/Chest: Effort normal and breath sounds normal. No respiratory distress.  Abdominal: Soft. There is no tenderness. There is no rebound and no guarding.  Musculoskeletal:       Legs: Patient with strong equal pedal pulses bilaterally.  Feet:  Right Foot:  Protective Sensation: 3 sites tested. 3 sites sensed.  Left Foot:  Protective Sensation: 3 sites tested. 3 sites sensed.  Neurological: She is alert and oriented to person, place, and time. No cranial nerve deficit or sensory deficit.  Skin: Capillary refill takes less than 2 seconds.     Psychiatric: She has a normal mood and affect. Her behavior is normal.     ED Treatments / Results  Labs (all labs ordered are listed, but only abnormal results are displayed) Labs Reviewed - No data to display  EKG None  Radiology Dg Tibia/fibula  Right  Result Date: 11/20/2017 CLINICAL DATA:  Fall 5 days ago.  Pain. EXAM: RIGHT TIBIA AND FIBULA - 2 VIEW COMPARISON:  Knee series performed today FINDINGS: Proximal right tibial/tibial plateau fractures noted as described on knee series. No additional tibial or fibular abnormality. IMPRESSION: Proximal tibial/tibial plateau fractures, better seen and described on the series. Electronically Signed   By: Rolm Baptise M.D.   On: 11/20/2017 14:31   Dg Ankle Complete Right  Result Date: 11/20/2017 CLINICAL DATA:  Fall 5 days ago.  Knee pain, swelling EXAM: RIGHT ANKLE - COMPLETE 3+ VIEW COMPARISON:  None. FINDINGS: There is no evidence of fracture, dislocation, or joint effusion. There is no evidence of arthropathy or other focal bone abnormality. Soft tissues are unremarkable. IMPRESSION: Negative. Electronically Signed   By: Rolm Baptise M.D.   On: 11/20/2017 14:32   Dg Knee Complete 4 Views Right  Result Date: 11/20/2017 CLINICAL DATA:  Fall 5 days ago.  Right knee pain EXAM: RIGHT KNEE - COMPLETE 4+ VIEW COMPARISON:  04/18/2015 FINDINGS: There is a right tibial plateau fracture entering the knee joint within the lateral tibial plateau, then extending through the medial proximal tibial metaphysis. This likely enters the joint within the medial tibial plateau as well, seen on the oblique view. Small joint effusion. IMPRESSION: Right proximal tibial/tibial plateau fractures entering the knee joint through both the medial and lateral tibial plateaus. This also involves the medial proximal tibial metaphysis. Electronically Signed   By: Rolm Baptise M.D.   On: 11/20/2017 14:31   Dg Foot Complete Right  Result Date: 11/20/2017 CLINICAL DATA:  Fall 5 days ago.  Pain EXAM: RIGHT FOOT COMPLETE - 3+ VIEW COMPARISON:  None. FINDINGS: There is no evidence of fracture or dislocation. There is no evidence of arthropathy or other focal bone abnormality. Soft tissues are unremarkable. IMPRESSION: Negative.  Electronically Signed   By: Rolm Baptise M.D.   On: 11/20/2017 14:32   Ct Extremity Lower Right Wo Contrast  Result Date: 11/20/2017 CLINICAL DATA:  Tibial plateau fracture. EXAM: CT OF THE LOWER RIGHT EXTREMITY WITHOUT CONTRAST TECHNIQUE:  Multidetector CT imaging of the right tibia and fibula was performed according to the standard protocol. COMPARISON:  Right knee and tibia/fibula x-rays from same day. FINDINGS: Bones/Joint/Cartilage Again seen is an acute, oblique, essentially nondisplaced fracture of the proximal medial tibial metaphysis with extension into the medial tibial plateau anteriorly and lateral tibial plateau posteriorly. There is no articular surface incongruity. No additional fracture identified. Small knee lipohemarthrosis. No tibiotalar joint effusion. Joint spaces are preserved. Bone mineralization is normal. Ligaments Suboptimally assessed by CT. Muscles and Tendons Unremarkable. Soft tissues Mild soft tissue swelling around the knee extending along the medial lower leg into the ankle. IMPRESSION: 1. Essentially nondisplaced fracture of the proximal medial tibial metaphysis with extension into the medial and lateral tibial plateaus (Schatzker type IV fracture). 2. Small knee lipohemarthrosis. Electronically Signed   By: Titus Dubin M.D.   On: 11/20/2017 18:17    Procedures Procedures (including critical care time)  Medications Ordered in ED Medications  morphine 4 MG/ML injection 4 mg (4 mg Intravenous Given 11/20/17 1528)  oxyCODONE-acetaminophen (PERCOCET/ROXICET) 5-325 MG per tablet 1 tablet (1 tablet Oral Given 11/20/17 1841)     Initial Impression / Assessment and Plan / ED Course  I have reviewed the triage vital signs and the nursing notes.  Pertinent labs & imaging results that were available during my care of the patient were reviewed by me and considered in my medical decision making (see chart for details).  Clinical Course as of Jul 23 0002  Mon Nov 21, 9258   3867 42 year old female with injury to her right knee on the 18th is here now with a known tibial plateau fracture but referred from orthopedist after she told him she is having coldness in her lower extremity.  She is got a good strong DP pulse here although her lower extremity is cool.  Cap refill is normal.  She is getting some repeat imaging and a duplex.  Ultimately we should review this with orthopedist and make sure we got a good plan for her follow-up.   [MB]  9629 Right lower extremity venous duplex completed. No evidence of a DVT or Baker's cyst. [place] Slaughter,RVS 11/20/2017, 3:38 PM   [BM]  1730 I have spoken to orthopedic surgeon Dr. Wynelle Link who recommends CT of the right leg and for the patient to call his office tomorrow for follow-up before the end of the week.   [BM]  5284 I have reevaluated and update the patient with the plan to discharge with orthopedic follow-up tomorrow.  Patient has strong pedal pulses bilaterally.   [BM]    Clinical Course User Index [BM] Deliah Boston, PA-C [MB] Hayden Rasmussen, MD   1. Essentially nondisplaced fracture of the proximal medial tibial  metaphysis with extension into the medial and lateral tibial  plateaus (Schatzker type IV fracture).  2. Small knee lipohemarthrosis.   CT of the lower right extremity above. I have spoken to Dr. Ricki Rodriguez at who recommended that the patient call his office tomorrow morning for follow-up.  Patient has strong and equal bilateral pedal pulses, and of arterial injury or compartment syndrome, no sign of nerve involvement, patient denies paresthesias/numbness or tingling.  I have prescribed the patient a small amount of oxycodone for her pain.  Knee immobilizer placed by orthopedic technician in emergency department.  I have informed the patient that it is very important for her to follow-up with [facility] first thing tomorrow morning for further evaluation and treatment.  Patient states that she  understands and will call them tomorrow morning. Patient with full movement of her lower extremity with some pain, full sensation intact.  To her bilateral lower extremities.  Capillary refill intact to all of her toes.  Patient with strong equal bilateral pedal pulses at discharge.  At this time there does not appear to be any evidence of an acute emergency medical condition and the patient appears stable for discharge with appropriate outpatient follow up. Diagnosis was discussed with patient who verbalizes understanding and is agreeable to discharge. I have discussed return precautions with patient and family who verbalize understanding of return precautions. Patient strongly encouraged to follow-up with their PCP as well as [facility].  Patient's case discussed with Dr. Melina Copa who agrees with plan to discharge with follow-up with [facility] tomorrow.     This note was dictated using DragonOne dictation software; please contact for any inconsistencies within the note.   Final Clinical Impressions(s) / ED Diagnoses   Final diagnoses:  Closed fracture of medial portion of right tibial plateau, initial encounter    ED Discharge Orders        Ordered    oxyCODONE-acetaminophen (PERCOCET/ROXICET) 5-325 MG tablet  Every 6 hours PRN     11/20/17 1924       Gari Crown 11/21/17 0018    Hayden Rasmussen, MD 11/29/17 1125

## 2017-11-20 NOTE — ED Triage Notes (Signed)
Per patient, states [place] the beach and injured right leg/knee tripping over curb-went to ED and was diagnosed with a knee fracture-placed in knee immobilizer and told to follow up with surgery-states this am her right foot/ankle is swollen and blue-states cold to touch-had an appointment with surgery today but was told to come to ED due to new symptoms

## 2017-11-20 NOTE — ED Notes (Signed)
Vascular currently at bedside.  

## 2017-11-20 NOTE — Discharge Instructions (Addendum)
Please call Dr. Anne Fu office at [facility] office tomorrow morning to schedule an appointment before the end of this week. Please call your primary care provider as soon as you can to follow-up regarding your visit today. Please return to the emergency department for any new or worsening symptoms or if your symptoms do not improve. You may use the pain medication prescribed as needed for severe pain.  Contact a health care provider if: Your pain is becoming worse rather than better or is not controlled with medicines. You have increased swelling or redness in your foot. You begin to lose feeling in your foot or toes. Get help right away if: Your foot or toes on the injured side feel cold or turn blue. You develop severe pain in your injured leg, especially if the pain is increased with movement of your toes.

## 2017-11-20 NOTE — Progress Notes (Signed)
Right lower extremity venous duplex completed. No evidence of a DVT or Baker's cyst. Crystal [name],RVS 11/20/2017, 3:38 PM

## 2017-11-21 ENCOUNTER — Telehealth: Payer: Self-pay

## 2017-11-21 NOTE — Telephone Encounter (Signed)
TCM ED follow up call made to patient to schedule appointment. States she is seen by [facility] now.

## 2017-11-23 NOTE — Patient Instructions (Addendum)
Crystal [name]  11/23/2017   Your procedure is scheduled on: 11/27/2017   Report to [name] Main  Entrance  Report to admitting at     200pm    Call this number if you have problems the morning of surgery [phone]   Remember: Do not eat food  :After Midnight.May have clear liquids from 12 midnite until 1030am am of surgery then nothing by mouth.       Take these medicines the morning of surgery with A SIP OF WATER: Inhalers as usual and bring, Xanax, Percocet if needed                                 You may not have any metal on your body including hair pins and              piercings  Do not wear jewelry, make-up, lotions, powders or perfumes, deodorant             Do not wear nail polish.  Do not shave  48 hours prior to surgery.                 Do not bring valuables to the hospital. [place] .  Contacts, dentures or bridgework may not be worn into surgery.  Leave suitcase in the car. After surgery it may be brought to your room.                       Please read over the following fact sheets you were given: _____________________________________________________________________             [facility] - Preparing for Surgery Before surgery, you can play an important role.  Because skin is not sterile, your skin needs to be as free of germs as possible.  You can reduce the number of germs on your skin by washing with CHG (chlorahexidine gluconate) soap before surgery.  CHG is an antiseptic cleaner which kills germs and bonds with the skin to continue killing germs even after washing. Please DO NOT use if you have an allergy to CHG or antibacterial soaps.  If your skin becomes reddened/irritated stop using the CHG and inform your nurse when you arrive at Short Stay. Do not shave (including legs and underarms) for at least 48 hours prior to the first CHG shower.  You may shave your  face/neck. Please follow these instructions carefully:  1.  Shower with CHG Soap the night before surgery and the  morning of Surgery.  2.  If you choose to wash your hair, wash your hair first as usual with your  normal  shampoo.  3.  After you shampoo, rinse your hair and body thoroughly to remove the  shampoo.                           4.  Use CHG as you would any other liquid soap.  You can apply chg directly  to the skin and wash                       Gently with a scrungie or clean washcloth.  5.  Apply  the CHG Soap to your body ONLY FROM THE NECK DOWN.   Do not use on face/ open                           Wound or open sores. Avoid contact with eyes, ears mouth and genitals (private parts).                       Wash face,  Genitals (private parts) with your normal soap.             6.  Wash thoroughly, paying special attention to the area where your surgery  will be performed.  7.  Thoroughly rinse your body with warm water from the neck down.  8.  DO NOT shower/wash with your normal soap after using and rinsing off  the CHG Soap.                9.  Pat yourself dry with a clean towel.            10.  Wear clean pajamas.            11.  Place clean sheets on your bed the night of your first shower and do not  sleep with pets. Day of Surgery : Do not apply any lotions/deodorants the morning of surgery.  Please wear clean clothes to the hospital/surgery center.  FAILURE TO FOLLOW THESE INSTRUCTIONS MAY RESULT IN THE CANCELLATION OF YOUR SURGERY PATIENT SIGNATURE_________________________________  NURSE SIGNATURE__________________________________  ________________________________________________________________________  WHAT IS A BLOOD TRANSFUSION? Blood Transfusion Information  A transfusion is the replacement of blood or some of its parts. Blood is made up of multiple cells which provide different functions.  Red blood cells carry oxygen and are used for blood loss  replacement.  White blood cells fight against infection.  Platelets control bleeding.  Plasma helps clot blood.  Other blood products are available for specialized needs, such as hemophilia or other clotting disorders. BEFORE THE TRANSFUSION  Who gives blood for transfusions?   Healthy volunteers who are fully evaluated to make sure their blood is safe. This is blood bank blood. Transfusion therapy is the safest it has ever been in the practice of medicine. Before blood is taken from a donor, a complete history is taken to make sure that person has no history of diseases nor engages in risky social behavior (examples are intravenous drug use or sexual activity with multiple partners). The donor's travel history is screened to minimize risk of transmitting infections, such as malaria. The donated blood is tested for signs of infectious diseases, such as HIV and hepatitis. The blood is then tested to be sure it is compatible with you in order to minimize the chance of a transfusion reaction. If you or a relative donates blood, this is often done in anticipation of surgery and is not appropriate for emergency situations. It takes many days to process the donated blood. RISKS AND COMPLICATIONS Although transfusion therapy is very safe and saves many lives, the main dangers of transfusion include:   Getting an infectious disease.  Developing a transfusion reaction. This is an allergic reaction to something in the blood you were given. Every precaution is taken to prevent this. The decision to have a blood transfusion has been considered carefully by your caregiver before blood is given. Blood is not given unless the benefits outweigh the risks. AFTER THE TRANSFUSION  Right after receiving a blood  transfusion, you will usually feel much better and more energetic. This is especially true if your red blood cells have gotten low (anemic). The transfusion raises the level of the red blood cells which  carry oxygen, and this usually causes an energy increase.  The nurse administering the transfusion will monitor you carefully for complications. HOME CARE INSTRUCTIONS  No special instructions are needed after a transfusion. You may find your energy is better. Speak with your caregiver about any limitations on activity for underlying diseases you may have. SEEK MEDICAL CARE IF:   Your condition is not improving after your transfusion.  You develop redness or irritation at the intravenous (IV) site. SEEK IMMEDIATE MEDICAL CARE IF:  Any of the following symptoms occur over the next 12 hours:  Shaking chills.  You have a temperature by mouth above 102 F (38.9 C), not controlled by medicine.  Chest, back, or muscle pain.  People around you feel you are not acting correctly or are confused.  Shortness of breath or difficulty breathing.  Dizziness and fainting.  You get a rash or develop hives.  You have a decrease in urine output.  Your urine turns a dark color or changes to pink, red, or brown. Any of the following symptoms occur over the next 10 days:  You have a temperature by mouth above 102 F (38.9 C), not controlled by medicine.  Shortness of breath.  Weakness after normal activity.  The white part of the eye turns yellow (jaundice).  You have a decrease in the amount of urine or are urinating less often.  Your urine turns a dark color or changes to pink, red, or brown. Document Released: 04/15/2000 Document Revised: 07/11/2011 Document Reviewed: 12/03/2007 ExitCare Patient Information 2014 [place].  _______________________________________________________________________  Incentive Spirometer  An incentive spirometer is a tool that can help keep your lungs clear and active. This tool measures how well you are filling your lungs with each breath. Taking long deep breaths may help reverse or decrease the chance of developing breathing (pulmonary) problems  (especially infection) following:  A long period of time when you are unable to move or be active. BEFORE THE PROCEDURE   If the spirometer includes an indicator to show your best effort, your nurse or respiratory therapist will set it to a desired goal.  If possible, sit up straight or lean slightly forward. Try not to slouch.  Hold the incentive spirometer in an upright position. INSTRUCTIONS FOR USE  1. Sit on the edge of your bed if possible, or sit up as far as you can in bed or on a chair. 2. Hold the incentive spirometer in an upright position. 3. Breathe out normally. 4. Place the mouthpiece in your mouth and seal your lips tightly around it. 5. Breathe in slowly and as deeply as possible, raising the piston or the ball toward the top of the column. 6. Hold your breath for 3-5 seconds or for as long as possible. Allow the piston or ball to fall to the bottom of the column. 7. Remove the mouthpiece from your mouth and breathe out normally. 8. Rest for a few seconds and repeat Steps 1 through 7 at least 10 times every 1-2 hours when you are awake. Take your time and take a few normal breaths between deep breaths. 9. The spirometer may include an indicator to show your best effort. Use the indicator as a goal to work toward during each repetition. 10. After each set of 10 deep  breaths, practice coughing to be sure your lungs are clear. If you have an incision (the cut made at the time of surgery), support your incision when coughing by placing a pillow or rolled up towels firmly against it. Once you are able to get out of bed, walk around indoors and cough well. You may stop using the incentive spirometer when instructed by your caregiver.  RISKS AND COMPLICATIONS  Take your time so you do not get dizzy or light-headed.  If you are in pain, you may need to take or ask for pain medication before doing incentive spirometry. It is harder to take a deep breath if you are having  pain. AFTER USE  Rest and breathe slowly and easily.  It can be helpful to keep track of a log of your progress. Your caregiver can provide you with a simple table to help with this. If you are using the spirometer at home, follow these instructions: [place] IF:   You are having difficultly using the spirometer.  You have trouble using the spirometer as often as instructed.  Your pain medication is not giving enough relief while using the spirometer.  You develop fever of 100.5 F (38.1 C) or higher. SEEK IMMEDIATE MEDICAL CARE IF:   You cough up bloody sputum that had not been present before.  You develop fever of 102 F (38.9 C) or greater.  You develop worsening pain at or near the incision site. MAKE SURE YOU:   Understand these instructions.  Will watch your condition.  Will get help right away if you are not doing well or get worse. Document Released: 08/29/2006 Document Revised: 07/11/2011 Document Reviewed: 10/30/2006 ExitCare Patient Information 2014 ExitCare, [place].   ________________________________________________________________________    CLEAR LIQUID DIET   Foods Allowed                                                                     Foods Excluded  Coffee and tea, regular and decaf                             liquids that you cannot  Plain Jell-O in any flavor                                             see through such as: Fruit ices (not with fruit pulp)                                     milk, soups, orange juice  Iced Popsicles                                    All solid food Carbonated beverages, regular and diet                                    Cranberry, grape and  apple juices Sports drinks like Gatorade Lightly seasoned clear broth or consume(fat free) Sugar, honey syrup  Sample Menu Breakfast                                Lunch                                     Supper Cranberry juice                    Beef broth                             Chicken broth Jell-O                                     Grape juice                           Apple juice Coffee or tea                        Jell-O                                      Popsicle                                                Coffee or tea                        Coffee or tea  _____________________________________________________________________

## 2017-11-24 ENCOUNTER — Encounter (HOSPITAL_COMMUNITY)
Admission: RE | Admit: 2017-11-24 | Discharge: 2017-11-24 | Disposition: A | Discharge status: Home / Self Care | Payer: Medicare Other | Source: Ambulatory Visit | Attending: Orthopedic Surgery | Admitting: Orthopedic Surgery

## 2017-11-24 ENCOUNTER — Other Ambulatory Visit: Payer: Self-pay

## 2017-11-24 ENCOUNTER — Encounter (HOSPITAL_COMMUNITY): Payer: Self-pay

## 2017-11-24 DIAGNOSIS — X58XXXA Exposure to other specified factors, initial encounter: Secondary | ICD-10-CM | POA: Insufficient documentation

## 2017-11-24 DIAGNOSIS — Z01818 Encounter for other preprocedural examination: Secondary | ICD-10-CM | POA: Insufficient documentation

## 2017-11-24 DIAGNOSIS — S82141A Displaced bicondylar fracture of right tibia, initial encounter for closed fracture: Secondary | ICD-10-CM | POA: Diagnosis not present

## 2017-11-24 LAB — CBC
HCT: 40.5 % (ref 36.0–46.0)
Hemoglobin: 13.9 g/dL (ref 12.0–15.0)
MCH: 32.7 pg (ref 26.0–34.0)
MCHC: 34.3 g/dL (ref 30.0–36.0)
MCV: 95.3 fL (ref 78.0–100.0)
PLATELETS: 292 10*3/uL (ref 150–400)
RBC: 4.25 MIL/uL (ref 3.87–5.11)
RDW: 13.1 % (ref 11.5–15.5)
WBC: 9.6 10*3/uL (ref 4.0–10.5)

## 2017-11-24 LAB — PROTIME-INR
INR: 0.94
PROTHROMBIN TIME: 12.5 s (ref 11.4–15.2)

## 2017-11-24 LAB — COMPREHENSIVE METABOLIC PANEL
ALBUMIN: 4.3 g/dL (ref 3.5–5.0)
ALT: 11 U/L (ref 0–44)
AST: 20 U/L (ref 15–41)
Alkaline Phosphatase: 93 U/L (ref 38–126)
Anion gap: 9 (ref 5–15)
BUN: 13 mg/dL (ref 6–20)
CHLORIDE: 105 mmol/L (ref 98–111)
CO2: 28 mmol/L (ref 22–32)
Calcium: 9.5 mg/dL (ref 8.9–10.3)
Creatinine, Ser: 0.8 mg/dL (ref 0.44–1.00)
GFR calc Af Amer: >60 mL/min (ref >60)
Glucose, Bld: 78 mg/dL (ref 70–99)
POTASSIUM: 3.6 mmol/L (ref 3.5–5.1)
SODIUM: 142 mmol/L (ref 135–145)
Total Bilirubin: 0.7 mg/dL (ref 0.3–1.2)
Total Protein: 7.5 g/dL (ref 6.5–8.1)

## 2017-11-24 LAB — APTT: APTT: 26 s (ref 24–36)

## 2017-11-24 LAB — SURGICAL PCR SCREEN
MRSA, PCR: NEGATIVE
STAPHYLOCOCCUS AUREUS: NEGATIVE

## 2017-11-24 LAB — ABO/RH: ABO/RH(D): O POS

## 2017-11-24 LAB — HCG, SERUM, QUALITATIVE: Preg, Serum: NEGATIVE

## 2017-11-27 ENCOUNTER — Encounter (HOSPITAL_COMMUNITY)
Admission: RE | Disposition: A | Discharge status: Home / Self Care | Payer: Self-pay | Source: Ambulatory Visit | Attending: Orthopedic Surgery

## 2017-11-27 ENCOUNTER — Encounter (HOSPITAL_COMMUNITY): Payer: Self-pay | Admitting: *Deleted

## 2017-11-27 ENCOUNTER — Ambulatory Visit (HOSPITAL_COMMUNITY): Payer: Medicare Other

## 2017-11-27 ENCOUNTER — Ambulatory Visit (HOSPITAL_COMMUNITY): Payer: Medicare Other | Admitting: Registered Nurse

## 2017-11-27 ENCOUNTER — Observation Stay (HOSPITAL_COMMUNITY)
Admission: RE | Admit: 2017-11-27 | Discharge: 2017-11-28 | Disposition: A | Discharge status: Home / Self Care | Payer: Medicare Other | Source: Ambulatory Visit | Attending: Orthopedic Surgery | Admitting: Orthopedic Surgery

## 2017-11-27 DIAGNOSIS — Z881 Allergy status to other antibiotic agents status: Secondary | ICD-10-CM | POA: Diagnosis not present

## 2017-11-27 DIAGNOSIS — R262 Difficulty in walking, not elsewhere classified: Secondary | ICD-10-CM | POA: Diagnosis not present

## 2017-11-27 DIAGNOSIS — F319 Bipolar disorder, unspecified: Secondary | ICD-10-CM | POA: Insufficient documentation

## 2017-11-27 DIAGNOSIS — R569 Unspecified convulsions: Secondary | ICD-10-CM | POA: Insufficient documentation

## 2017-11-27 DIAGNOSIS — Z9013 Acquired absence of bilateral breasts and nipples: Secondary | ICD-10-CM | POA: Diagnosis not present

## 2017-11-27 DIAGNOSIS — Z8614 Personal history of Methicillin resistant Staphylococcus aureus infection: Secondary | ICD-10-CM | POA: Diagnosis not present

## 2017-11-27 DIAGNOSIS — W01198A Fall on same level from slipping, tripping and stumbling with subsequent striking against other object, initial encounter: Secondary | ICD-10-CM | POA: Diagnosis not present

## 2017-11-27 DIAGNOSIS — Z888 Allergy status to other drugs, medicaments and biological substances status: Secondary | ICD-10-CM | POA: Diagnosis not present

## 2017-11-27 DIAGNOSIS — S82141A Displaced bicondylar fracture of right tibia, initial encounter for closed fracture: Secondary | ICD-10-CM

## 2017-11-27 DIAGNOSIS — F419 Anxiety disorder, unspecified: Secondary | ICD-10-CM | POA: Insufficient documentation

## 2017-11-27 DIAGNOSIS — Z79899 Other long term (current) drug therapy: Secondary | ICD-10-CM | POA: Diagnosis not present

## 2017-11-27 DIAGNOSIS — S82144A Nondisplaced bicondylar fracture of right tibia, initial encounter for closed fracture: Secondary | ICD-10-CM | POA: Diagnosis present

## 2017-11-27 DIAGNOSIS — Z853 Personal history of malignant neoplasm of breast: Secondary | ICD-10-CM | POA: Diagnosis not present

## 2017-11-27 HISTORY — PX: ORIF TIBIA PLATEAU: SHX2132

## 2017-11-27 LAB — TYPE AND SCREEN
ABO/RH(D): O POS
ANTIBODY SCREEN: NEGATIVE

## 2017-11-27 SURGERY — OPEN REDUCTION INTERNAL FIXATION (ORIF) TIBIAL PLATEAU
Anesthesia: General | Site: Knee | Laterality: Right

## 2017-11-27 MED ORDER — NICOTINE 14 MG/24HR TD PT24
14.0000 mg | MEDICATED_PATCH | Freq: Every day | TRANSDERMAL | Status: DC
  Administered 2017-11-27 – 2017-11-28 (×2): 14 mg via TRANSDERMAL
  Filled 2017-11-27 (×2): qty 1

## 2017-11-27 MED ORDER — MIDAZOLAM HCL 2 MG/2ML IJ SOLN
0.5000 mg | INTRAMUSCULAR | Status: DC | PRN
  Administered 2017-11-27: 0.5 mg via INTRAVENOUS
  Administered 2017-11-27: 1 mg via INTRAVENOUS
  Administered 2017-11-27: 0.5 mg via INTRAVENOUS

## 2017-11-27 MED ORDER — CEFAZOLIN SODIUM-DEXTROSE 2-4 GM/100ML-% IV SOLN
2.0000 g | INTRAVENOUS | Status: AC
  Administered 2017-11-27: 2 g via INTRAVENOUS
  Filled 2017-11-27: qty 100

## 2017-11-27 MED ORDER — MIDAZOLAM HCL 2 MG/2ML IJ SOLN
INTRAMUSCULAR | Status: AC
  Filled 2017-11-27: qty 2

## 2017-11-27 MED ORDER — CEFAZOLIN SODIUM-DEXTROSE 1-4 GM/50ML-% IV SOLN
1.0000 g | Freq: Four times a day (QID) | INTRAVENOUS | Status: AC
  Administered 2017-11-27 – 2017-11-28 (×3): 1 g via INTRAVENOUS
  Filled 2017-11-27 (×3): qty 50

## 2017-11-27 MED ORDER — ONDANSETRON HCL 4 MG/2ML IJ SOLN: 4.0000 mg | Freq: Four times a day (QID) | INTRAMUSCULAR | Status: DC | PRN

## 2017-11-27 MED ORDER — ALPRAZOLAM 1 MG PO TABS
1.0000 mg | ORAL_TABLET | Freq: Three times a day (TID) | ORAL | Status: DC
  Administered 2017-11-27 – 2017-11-28 (×2): 1 mg via ORAL
  Filled 2017-11-27 (×2): qty 1

## 2017-11-27 MED ORDER — ONDANSETRON HCL 4 MG/2ML IJ SOLN
INTRAMUSCULAR | Status: AC
  Filled 2017-11-27: qty 2

## 2017-11-27 MED ORDER — DOCUSATE SODIUM 100 MG PO CAPS
100.0000 mg | ORAL_CAPSULE | Freq: Two times a day (BID) | ORAL | Status: DC
  Administered 2017-11-27 – 2017-11-28 (×2): 100 mg via ORAL
  Filled 2017-11-27 (×2): qty 1

## 2017-11-27 MED ORDER — CHLORHEXIDINE GLUCONATE 4 % EX LIQD: 60.0000 mL | Freq: Once | CUTANEOUS | Status: DC

## 2017-11-27 MED ORDER — ALBUTEROL SULFATE (2.5 MG/3ML) 0.083% IN NEBU: 2.5000 mg | INHALATION_SOLUTION | RESPIRATORY_TRACT | Status: DC | PRN

## 2017-11-27 MED ORDER — LACTATED RINGERS IV SOLN
INTRAVENOUS | Status: DC
  Administered 2017-11-27 (×2): via INTRAVENOUS

## 2017-11-27 MED ORDER — DEXAMETHASONE SODIUM PHOSPHATE 10 MG/ML IJ SOLN
INTRAMUSCULAR | Status: DC | PRN
  Administered 2017-11-27: 10 mg via INTRAVENOUS

## 2017-11-27 MED ORDER — MEPERIDINE HCL 50 MG/ML IJ SOLN: 6.2500 mg | INTRAMUSCULAR | Status: DC | PRN

## 2017-11-27 MED ORDER — LIDOCAINE 2% (20 MG/ML) 5 ML SYRINGE
INTRAMUSCULAR | Status: AC
  Filled 2017-11-27: qty 5

## 2017-11-27 MED ORDER — DEXAMETHASONE SODIUM PHOSPHATE 10 MG/ML IJ SOLN
INTRAMUSCULAR | Status: AC
  Filled 2017-11-27: qty 1

## 2017-11-27 MED ORDER — METHOCARBAMOL 1000 MG/10ML IJ SOLN
500.0000 mg | Freq: Four times a day (QID) | INTRAVENOUS | Status: DC | PRN
  Administered 2017-11-27: 500 mg via INTRAVENOUS
  Filled 2017-11-27: qty 550

## 2017-11-27 MED ORDER — ONDANSETRON HCL 4 MG PO TABS: 4.0000 mg | ORAL_TABLET | Freq: Four times a day (QID) | ORAL | Status: DC | PRN

## 2017-11-27 MED ORDER — 0.9 % SODIUM CHLORIDE (POUR BTL) OPTIME
TOPICAL | Status: DC | PRN
  Administered 2017-11-27: 1000 mL

## 2017-11-27 MED ORDER — HYDROMORPHONE HCL 1 MG/ML IJ SOLN
0.5000 mg | INTRAMUSCULAR | Status: DC | PRN
  Administered 2017-11-27 – 2017-11-28 (×3): 1 mg via INTRAVENOUS
  Filled 2017-11-27 (×3): qty 1

## 2017-11-27 MED ORDER — METHOCARBAMOL 500 MG PO TABS
500.0000 mg | ORAL_TABLET | Freq: Four times a day (QID) | ORAL | Status: DC | PRN
  Administered 2017-11-28: 500 mg via ORAL
  Filled 2017-11-27: qty 1

## 2017-11-27 MED ORDER — BUPIVACAINE-EPINEPHRINE 0.25% -1:200000 IJ SOLN
INTRAMUSCULAR | Status: DC | PRN
  Administered 2017-11-27: 25 mL

## 2017-11-27 MED ORDER — OXYCODONE HCL 5 MG PO TABS: 5.0000 mg | ORAL_TABLET | ORAL | Status: DC | PRN

## 2017-11-27 MED ORDER — METOCLOPRAMIDE HCL 5 MG PO TABS: 5.0000 mg | ORAL_TABLET | Freq: Three times a day (TID) | ORAL | Status: DC | PRN

## 2017-11-27 MED ORDER — BUPIVACAINE-EPINEPHRINE (PF) 0.25% -1:200000 IJ SOLN
INTRAMUSCULAR | Status: AC
  Filled 2017-11-27: qty 30

## 2017-11-27 MED ORDER — METOCLOPRAMIDE HCL 5 MG/ML IJ SOLN: 5.0000 mg | Freq: Three times a day (TID) | INTRAMUSCULAR | Status: DC | PRN

## 2017-11-27 MED ORDER — MIDAZOLAM HCL 5 MG/5ML IJ SOLN
INTRAMUSCULAR | Status: DC | PRN
  Administered 2017-11-27: 2 mg via INTRAVENOUS

## 2017-11-27 MED ORDER — OXYCODONE HCL 5 MG PO TABS: 5.0000 mg | ORAL_TABLET | Freq: Once | ORAL | Status: DC | PRN

## 2017-11-27 MED ORDER — LIDOCAINE 2% (20 MG/ML) 5 ML SYRINGE
INTRAMUSCULAR | Status: DC | PRN
  Administered 2017-11-27: 60 mg via INTRAVENOUS

## 2017-11-27 MED ORDER — FENTANYL CITRATE (PF) 100 MCG/2ML IJ SOLN
INTRAMUSCULAR | Status: DC | PRN
  Administered 2017-11-27 (×5): 50 ug via INTRAVENOUS

## 2017-11-27 MED ORDER — LAMOTRIGINE 100 MG PO TABS
300.0000 mg | ORAL_TABLET | Freq: Every day | ORAL | Status: DC
  Administered 2017-11-27: 300 mg via ORAL
  Filled 2017-11-27: qty 3

## 2017-11-27 MED ORDER — PROPOFOL 10 MG/ML IV BOLUS
INTRAVENOUS | Status: DC | PRN
  Administered 2017-11-27: 160 mg via INTRAVENOUS

## 2017-11-27 MED ORDER — HYDROMORPHONE HCL 1 MG/ML IJ SOLN
INTRAMUSCULAR | Status: AC
  Filled 2017-11-27: qty 2

## 2017-11-27 MED ORDER — ACETAMINOPHEN 10 MG/ML IV SOLN
1000.0000 mg | Freq: Four times a day (QID) | INTRAVENOUS | Status: DC
  Administered 2017-11-27: 1000 mg via INTRAVENOUS

## 2017-11-27 MED ORDER — ENOXAPARIN SODIUM 40 MG/0.4ML ~~LOC~~ SOLN
40.0000 mg | SUBCUTANEOUS | Status: AC
  Administered 2017-11-28: 40 mg via SUBCUTANEOUS
  Filled 2017-11-27: qty 0.4

## 2017-11-27 MED ORDER — PHENYLEPHRINE HCL 10 MG/ML IJ SOLN
INTRAMUSCULAR | Status: DC | PRN
  Administered 2017-11-27: 120 ug via INTRAVENOUS
  Administered 2017-11-27: 80 ug via INTRAVENOUS

## 2017-11-27 MED ORDER — HYDROMORPHONE HCL 1 MG/ML IJ SOLN
0.2500 mg | INTRAMUSCULAR | Status: DC | PRN
  Administered 2017-11-27 (×4): 0.5 mg via INTRAVENOUS

## 2017-11-27 MED ORDER — ONDANSETRON HCL 4 MG/2ML IJ SOLN
INTRAMUSCULAR | Status: DC | PRN
  Administered 2017-11-27: 4 mg via INTRAVENOUS

## 2017-11-27 MED ORDER — SODIUM CHLORIDE 0.9 % IV SOLN
INTRAVENOUS | Status: DC
  Administered 2017-11-27 – 2017-11-28 (×2): via INTRAVENOUS

## 2017-11-27 MED ORDER — OXYCODONE HCL 5 MG PO TABS
10.0000 mg | ORAL_TABLET | ORAL | Status: DC | PRN
  Administered 2017-11-27 – 2017-11-28 (×5): 15 mg via ORAL
  Filled 2017-11-27 (×5): qty 3

## 2017-11-27 MED ORDER — FENTANYL CITRATE (PF) 250 MCG/5ML IJ SOLN
INTRAMUSCULAR | Status: AC
  Filled 2017-11-27: qty 5

## 2017-11-27 MED ORDER — ACETAMINOPHEN 325 MG PO TABS
325.0000 mg | ORAL_TABLET | Freq: Four times a day (QID) | ORAL | Status: DC | PRN
  Administered 2017-11-28: 650 mg via ORAL
  Filled 2017-11-27: qty 2

## 2017-11-27 MED ORDER — OXYCODONE HCL 5 MG/5ML PO SOLN: 5.0000 mg | Freq: Once | ORAL | Status: DC | PRN

## 2017-11-27 SURGICAL SUPPLY — 67 items
3.5 Cortocal Screw x28 ×2 IMPLANT
3.5 Cortocal Screw x30 ×2 IMPLANT
4..0 Cancellous Screw x50 ×4 IMPLANT
4.0 Cancellous Screw x40 ×2 IMPLANT
6.5 Cannulated Screw x65 ×2 IMPLANT
6.5 cannulated Scre x70 ×2 IMPLANT
BAG ZIPLOCK 12X15 (MISCELLANEOUS) ×2 IMPLANT
BANDAGE ESMARK 6X9 LF (GAUZE/BANDAGES/DRESSINGS) ×1 IMPLANT
BIT DRILL 2.5X2.75 QC CALB (BIT) ×2 IMPLANT
BNDG ESMARK 6X9 LF (GAUZE/BANDAGES/DRESSINGS) ×2
COVER SURGICAL LIGHT HANDLE (MISCELLANEOUS) ×2 IMPLANT
CUFF TOURN SGL QUICK 34 (TOURNIQUET CUFF) ×1
CUFF TRNQT CYL 34X4X40X1 (TOURNIQUET CUFF) ×1 IMPLANT
DRAPE C-ARM 42X120 X-RAY (DRAPES) ×2 IMPLANT
DRAPE SHEET LG 3/4 BI-LAMINATE (DRAPES) ×2 IMPLANT
DRAPE U-SHAPE 47X51 STRL (DRAPES) ×2 IMPLANT
DRSG EMULSION OIL 3X16 NADH (GAUZE/BANDAGES/DRESSINGS) ×2 IMPLANT
DURAPREP 26ML APPLICATOR (WOUND CARE) ×4 IMPLANT
ELECT REM PT RETURN 15FT ADLT (MISCELLANEOUS) ×2 IMPLANT
GAUZE SPONGE 4X4 12PLY STRL (GAUZE/BANDAGES/DRESSINGS) ×2 IMPLANT
GLOVE BIO SURGEON STRL SZ7.5 (GLOVE) ×2 IMPLANT
GLOVE BIO SURGEON STRL SZ8 (GLOVE) ×2 IMPLANT
GLOVE BIOGEL PI IND STRL 7.0 (GLOVE) ×1 IMPLANT
GLOVE BIOGEL PI IND STRL 8 (GLOVE) ×3 IMPLANT
GLOVE BIOGEL PI INDICATOR 7.0 (GLOVE) ×1
GLOVE BIOGEL PI INDICATOR 8 (GLOVE) ×3
GLOVE SURG SS PI 7.0 STRL IVOR (GLOVE) ×2 IMPLANT
GOWN STRL REUS W/TWL LRG LVL3 (GOWN DISPOSABLE) ×4 IMPLANT
GOWN STRL REUS W/TWL XL LVL3 (GOWN DISPOSABLE) ×2 IMPLANT
IMMOBILIZER KNEE 20 (SOFTGOODS)
IMMOBILIZER KNEE 20 THIGH 36 (SOFTGOODS) IMPLANT
IMMOBILIZER KNEE 22 UNIV (SOFTGOODS) ×2 IMPLANT
KIT BASIN OR (CUSTOM PROCEDURE TRAY) ×2 IMPLANT
MANIFOLD NEPTUNE II (INSTRUMENTS) ×2 IMPLANT
PACK TOTAL JOINT (CUSTOM PROCEDURE TRAY) ×2 IMPLANT
PAD CAST 4YDX4 CTTN HI CHSV (CAST SUPPLIES) ×2 IMPLANT
PADDING CAST COTTON 4X4 STRL (CAST SUPPLIES) ×2
PADDING CAST COTTON 6X4 STRL (CAST SUPPLIES) IMPLANT
PADDING CAST SYN 6 (CAST SUPPLIES) ×1
PADDING CAST SYNTHETIC 6X4 NS (CAST SUPPLIES) ×1 IMPLANT
PIN THREADED GUIDE ACE (PIN) ×4 IMPLANT
PLATE ACE META (Plate) ×1 IMPLANT
PLATE ACE SM 71.4X30.5X1X3 HL (Plate) ×1 IMPLANT
POSITIONER SURGICAL ARM (MISCELLANEOUS) ×2 IMPLANT
SCREW CANN 6.5 65MM (Screw) ×2 IMPLANT
SCREW CANN 6.5 70MM (Screw) ×1 IMPLANT
SCREW CANN LG 6.5 FLT 70X22 (Screw) ×1 IMPLANT
SCREW NLOCK CANC HEX 4X28 (Screw) ×1 IMPLANT
SCREW NLOCK CANC HEX 4X30 (Screw) ×1 IMPLANT
SCREW NLOCK CANC HEX 4X40 (Screw) ×2 IMPLANT
SCREW NLOCK CANC HEX 4X50 (Screw) ×4 IMPLANT
SCREW NLOCK FT 28X4XSLD NS FIB (Screw) ×1 IMPLANT
SCREW NLOCK T15 FT 30X4XST TIP (Screw) ×1 IMPLANT
SPONGE LAP 18X18 RF (DISPOSABLE) ×4 IMPLANT
STOCKINETTE 8 INCH (MISCELLANEOUS) ×2 IMPLANT
STRIP CLOSURE SKIN 1/2X4 (GAUZE/BANDAGES/DRESSINGS) ×2 IMPLANT
SUCTION FRAZIER HANDLE 10FR (MISCELLANEOUS)
SUCTION TUBE FRAZIER 10FR DISP (MISCELLANEOUS) IMPLANT
SUT ETHILON 4 0 PS 2 18 (SUTURE) IMPLANT
SUT MNCRL AB 4-0 PS2 18 (SUTURE) ×2 IMPLANT
SUT VIC AB 1 CT1 27 (SUTURE) ×4
SUT VIC AB 1 CT1 27XBRD ANTBC (SUTURE) ×2 IMPLANT
SUT VIC AB 2-0 CT2 27 (SUTURE) ×2 IMPLANT
SYR 30ML LL (SYRINGE) ×2 IMPLANT
Small 3 lole Peri Metal Plate ×2 IMPLANT
TOWEL OR 17X26 10 PK STRL BLUE (TOWEL DISPOSABLE) ×4 IMPLANT
WRAP KNEE MAXI GEL POST OP (GAUZE/BANDAGES/DRESSINGS) ×2 IMPLANT

## 2017-11-27 NOTE — H&P (Signed)
[name] is a 42 y.o. female who presents with right knee pain.  HPI- . Knee Pain: Patient presents with knee pain involving the  right knee. Onset of the symptoms was a little over  a week ago. Inciting event: none known, She tripped over a curb and twisted her righ leg falling and landing on it with immediate pain. She was able to get up and traveled to the beach. She was in the ED there with significant pain and swelling and had X-rays when she returned home showing a tibial plateau fracture. She presented in the office 4 days ago for evaluation.. Current symptoms include pain located right knee and swelling.Patient has had no prior knee problems. Evaluation to date: plain films: abnormal non-displaced tibial plateau fracture and CT showing the same. Treatment to date: brace which is effective and rest.  Past Medical History:  Diagnosis Date  . Anxiety   . Bipolar affective disorder, mixed ([place])   . Cancer ([place])   . Depression   . Family history of anesthesia complication    MOTHER PONV  . Frequency of urination   . GERD (gastroesophageal reflux disease)    OCCASIONALLY TAKE ZANTAC  . Heart palpitations   . History of breast cancer DX JUNE 2012 W/ RIGHT BREAST INVASIVE DUCTAL CA  STAGE IIB---  S/P BILATERAL MASTECTOMIES AND RIGHT NODE DISSECTION   ONCOLOGIST-- DR Jana Hakim--  CHEMO ENDED [place] 2012 / RADIATION ENDED MAR 2013--  CURRENT ON TAMOXIFEN AND ZOLADEX  . History of idiopathic seizure    DX 2008 --  LAST ONE 2012--  NO ISSUES SINCE AND NO MEDS. -- PT STATES DOCTORS FELT IT WAS STRESS RELATED DUE TO HEALTH ISSUES  . Hot flashes due to tamoxifen   . Hx MRSA infection 03/28/11   Skin  . Left ovarian cyst   . Nocturia   . Right lower quadrant abdominal pain   . Seizures ([place])    2016 last seizure   . Status post chemotherapy    docetaxel/carboplatin/trastuzumab  . Strains to urinate   . Urgency of urination     Past Surgical History:  Procedure Laterality Date  .  BIOPSY BREAST  10/14/2010   right needle core biopsy  . BREAST SURGERY  11/16/2010   bilateral mastectomy+ right axillary node dissection,T1cN1a, Her2+,ERPR+  (right breast invasive ductal carcinoma)  . CYSTO WITH HYDRODISTENSION N/A 07/23/2012   Procedure: CYSTOSCOPY/HYDRODISTENSION instillation of marcaine and pyridium;  Surgeon: Reece Packer, MD;  Location: [facility];  Service: Urology;  Laterality: N/A;  INSTILLATION    . I&D EXTREMITY  09/26/2011   Procedure: IRRIGATION AND DEBRIDEMENT EXTREMITY;  Surgeon: Tennis Must, MD;  Location: [facility];  Service: Orthopedics;  Laterality: Right;  I&D right hand cat bite wound  . PORTACATH PLACEMENT  11/16/2010   placement of left subclavian port  . right knee torn meniscus surgery     . TRANSTHORACIC ECHOCARDIOGRAM  05-28-2012   LVF NORMAL/  EF 55-60%  . WISDOM TOOTH EXTRACTION      Prior to Admission medications   Medication Sig Start Date End Date Taking? Authorizing Provider  albuterol (PROVENTIL HFA;VENTOLIN HFA) 108 (90 Base) MCG/ACT inhaler Inhale 1 puff into the lungs every 4 (four) hours as needed for wheezing or shortness of breath. 10/31/17   [provider]  ALPRAZolam (XANAX) 1 MG tablet TAKE 1 TABLET BY MOUTH 3 TIMES A DAY AS NEEDED FOR ANXIETY Patient taking differently: Take 1 mg by mouth  3 (three) times daily.  05/12/15   Ezekiel Slocumb, PA-C  lamoTRIgine (LAMICTAL) 150 MG tablet Take 300 mg by mouth at bedtime. 11/13/17   [provider]  oxyCODONE-acetaminophen (PERCOCET/ROXICET) 5-325 MG tablet Take 1 tablet by mouth every 6 (six) hours as needed for severe pain. 11/20/17   Deliah Boston, PA-C  predniSONE (DELTASONE) 5 MG tablet Take 1 tablet (5 mg total) by mouth daily with breakfast. Patient not taking: Reported on 11/20/2017 07/04/17   [name], Virgie Dad, MD   KNEE EXAM antalgic gait, soft tissue tenderness over proximal tibia, effusion, reduced range of motion, exam limited by acuity of  pain, collateral ligaments intact, compartments soft RLE  Physical Examination: General appearance - alert, well appearing, and in no distress Mental status - alert, oriented to person, place, and time Chest - clear to auscultation, no wheezes, rales or rhonchi, symmetric air entry Heart - normal rate, regular rhythm, normal S1, S2, no murmurs, rubs, clicks or gallops Abdomen - soft, nontender, nondistended, no masses or organomegaly Neurological - alert, oriented, normal speech, no focal findings or movement disorder noted Extremities - peripheral pulses normal, no pedal edema, no clubbing or cyanosis   Asessment/Plan--- Right tibial plateau fracture- - Plan open reduction and internal fixation of right tibial plateau fracture. Procedure risks and potential comps discussed with patient who elects to proceed. Goals are decreased pain and increased function with a high likelihood of achieving both

## 2017-11-27 NOTE — Anesthesia Postprocedure Evaluation (Signed)
Anesthesia Post Note  Patient: Crystal [name]  Procedure(s) Performed: OPEN REDUCTION INTERNAL FIXATION (ORIF) TIBIAL PLATEAU FRACTURE (Right Knee)     Patient location during evaluation: PACU Anesthesia Type: General Level of consciousness: awake and alert Pain management: pain level controlled Vital Signs Assessment: post-procedure vital signs reviewed and stable Respiratory status: spontaneous breathing, nonlabored ventilation and respiratory function stable Cardiovascular status: blood pressure returned to baseline and stable Postop Assessment: no apparent nausea or vomiting Anesthetic complications: no    Last Vitals:  Vitals:   11/27/17 1830 11/27/17 1845  BP: 122/80 124/90  Pulse: 88 77  Resp: 19 19  Temp: 36.6 C   SpO2: 100% 100%    Last Pain:  Vitals:   11/27/17 1845  TempSrc:   PainSc: [name]

## 2017-11-27 NOTE — Transfer of Care (Signed)
Immediate Anesthesia Transfer of Care Note  Patient: Crystal [name]  Procedure(s) Performed: OPEN REDUCTION INTERNAL FIXATION (ORIF) TIBIAL PLATEAU FRACTURE (Right Knee)  Patient Location: PACU  Anesthesia Type:General  Level of Consciousness: awake, alert  and oriented  Airway & Oxygen Therapy: Patient Spontanous Breathing and Patient connected to face mask oxygen  Post-op Assessment: Report given to RN and Post -op Vital signs reviewed and stable  Post vital signs: Reviewed and stable  Last Vitals:  Vitals Value Taken Time  BP    Temp    Pulse 83 11/27/2017  6:31 PM  Resp 14 11/27/2017  6:31 PM  SpO2 100 % 11/27/2017  6:31 PM  Vitals shown include unvalidated device data.  Last Pain:  Vitals:   11/27/17 1454  TempSrc:   PainSc: 7          Complications: No apparent anesthesia complications

## 2017-11-27 NOTE — Brief Op Note (Signed)
11/27/2017  6:15 PM  PATIENT:  [name]  42 y.o. female  PRE-OPERATIVE DIAGNOSIS:  right tibial plateau fracture  POST-OPERATIVE DIAGNOSIS:  right tibial plateau fracture  PROCEDURE:  Procedure(s): OPEN REDUCTION INTERNAL FIXATION (ORIF) TIBIAL PLATEAU FRACTURE (Right)  SURGEON:  Surgeon(s) and Role:    Gaynelle Arabian, MD - Primary  PHYSICIAN ASSISTANT:   ASSISTANTS: Theresa Duty, PA-C   ANESTHESIA:   general  EBL:  50 mL   BLOOD ADMINISTERED:none  DRAINS: none   LOCAL MEDICATIONS USED:  MARCAINE     COUNTS:  YES  TOURNIQUET:   Total Tourniquet Time Documented: Thigh (Right) - 36 minutes Total: Thigh (Right) - 36 minutes   DICTATION: .Other Dictation: Dictation Number [id number]  PLAN OF CARE: Admit for overnight observation  PATIENT DISPOSITION:  PACU - hemodynamically stable.

## 2017-11-27 NOTE — Anesthesia Procedure Notes (Signed)
Procedure Name: LMA Insertion Date/Time: 11/27/2017 5:05 PM Performed by: Talbot Grumbling, CRNA Pre-anesthesia Checklist: Patient identified, Emergency Drugs available, Suction available and Patient being monitored Patient Re-evaluated:Patient Re-evaluated prior to induction Oxygen Delivery Method: Circle system utilized Preoxygenation: Pre-oxygenation with 100% oxygen Induction Type: IV induction Ventilation: Mask ventilation without difficulty LMA: LMA inserted LMA Size: 4.0 Number of attempts: 1 Placement Confirmation: positive ETCO2 and breath sounds checked- equal and bilateral Tube secured with: Tape Dental Injury: Teeth and Oropharynx as per pre-operative assessment

## 2017-11-27 NOTE — Anesthesia Preprocedure Evaluation (Signed)
Anesthesia Evaluation  Patient identified by MRN, date of birth, ID band Patient awake    Reviewed: Allergy & Precautions, H&P , NPO status , Patient's Chart, lab work & pertinent test results  Airway Mallampati: II  TM Distance: >3 FB Neck ROM: Full    Dental no notable dental hx. (+) Dental Advisory Given   Pulmonary Current Smoker,    Pulmonary exam normal breath sounds clear to auscultation       Cardiovascular + Peripheral Vascular Disease   Rhythm:Regular Rate:Normal     Neuro/Psych Seizures -, Well Controlled,  Bipolar Disorder negative psych ROS   GI/Hepatic negative GI ROS, Neg liver ROS, GERD  Medicated,  Endo/Other  H/O Breast Ca  Renal/GU negative Renal ROS  negative genitourinary   Musculoskeletal negative musculoskeletal ROS (+)   Abdominal   Peds negative pediatric ROS (+)  Hematology negative hematology ROS (+)   Anesthesia Other Findings   Reproductive/Obstetrics negative OB ROS Hx of Breast Cancer                             Anesthesia Physical  Anesthesia Plan  ASA: II  Anesthesia Plan: General   Post-op Pain Management:    Induction: Intravenous  PONV Risk Score and Plan: 2 and Ondansetron and Midazolam  Airway Management Planned: LMA  Additional Equipment:   Intra-op Plan:   Post-operative Plan: Extubation in OR  Informed Consent: I have reviewed the patients History and Physical, chart, labs and discussed the procedure including the risks, benefits and alternatives for the proposed anesthesia with the patient or authorized representative who has indicated his/her understanding and acceptance.   Dental advisory given  Plan Discussed with: CRNA  Anesthesia Plan Comments:         Anesthesia Quick Evaluation

## 2017-11-28 ENCOUNTER — Other Ambulatory Visit: Payer: Self-pay

## 2017-11-28 ENCOUNTER — Encounter (HOSPITAL_COMMUNITY): Payer: Self-pay | Admitting: Orthopedic Surgery

## 2017-11-28 DIAGNOSIS — S82144A Nondisplaced bicondylar fracture of right tibia, initial encounter for closed fracture: Secondary | ICD-10-CM | POA: Diagnosis not present

## 2017-11-28 MED ORDER — ASPIRIN 325 MG PO TABS: 325.0000 mg | ORAL_TABLET | Freq: Every day | ORAL | 0 refills | Status: DC

## 2017-11-28 MED ORDER — OXYCODONE HCL 5 MG PO TABS: 5.0000 mg | ORAL_TABLET | Freq: Four times a day (QID) | ORAL | 0 refills | Status: DC | PRN

## 2017-11-28 MED ORDER — ASPIRIN 325 MG PO TABS
325.0000 mg | ORAL_TABLET | Freq: Every day | ORAL | Status: DC
  Filled 2017-11-28: qty 1

## 2017-11-28 MED ORDER — METHOCARBAMOL 500 MG PO TABS: 500.0000 mg | ORAL_TABLET | Freq: Four times a day (QID) | ORAL | 0 refills | Status: DC | PRN

## 2017-11-28 NOTE — Progress Notes (Signed)
  11/28/2017 1 Day Post-Op Procedure(s) (LRB): OPEN REDUCTION INTERNAL FIXATION (ORIF) TIBIAL PLATEAU FRACTURE (Right) Patient reports pain as mild.   Patient seen in rounds by Dr. Wynelle Link. Patient is well, and has had no acute complaints or problems other than pain in the right knee. Foley catheter to be removed this AM. Denies chest pain, calf pain or SOB. Will begin working with therapy today. They will be PWB 25-50% to the right. Plan is to go Home after hospital stay.  Vital signs in last 24 hours: Temp:  [97.7 F (36.5 C)-98.8 F (37.1 C)] 98 F (36.7 C) (07/30 0552) Pulse Rate:  [63-88] 77 (07/30 0552) Resp:  [11-19] 18 (07/30 0552) BP: (95-124)/(60-90) 95/60 (07/30 0552) SpO2:  [96 %-100 %] 98 % (07/30 0552) Weight:  [54 kg (119 lb)] 54 kg (119 lb) (07/29 1454)  I&O's: I/O last 3 completed shifts: In: 3606.3 [P.O.:1420; I.V.:2086.3; IV Piggyback:100] Out: 1350 [Urine:1300; Blood:50] No intake/output data recorded.  Labs: No results for input(s): HGB in the last 72 hours. No results for input(s): WBC, RBC, HCT, PLT in the last 72 hours. No results for input(s): NA, K, CL, CO2, BUN, CREATININE, GLUCOSE, CALCIUM in the last 72 hours. No results for input(s): LABPT, INR in the last 72 hours.  Exam: General - Patient is Alert and Oriented Extremity - Neurologically intact Neurovascular intact Sensation intact distally Dorsiflexion/Plantar flexion intact Dressing - dressing C/D/I Motor Function - intact, moving foot and toes well on exam.   Past Medical History:  Diagnosis Date  . Anxiety   . Bipolar affective disorder, mixed ([place])   . Cancer ([place])   . Depression   . Family history of anesthesia complication    MOTHER PONV  . Frequency of urination   . GERD (gastroesophageal reflux disease)    OCCASIONALLY TAKE ZANTAC  . Heart palpitations   . History of breast cancer DX JUNE 2012 W/ RIGHT BREAST INVASIVE DUCTAL CA  STAGE IIB---  S/P BILATERAL MASTECTOMIES AND  RIGHT NODE DISSECTION   ONCOLOGIST-- DR Jana Hakim--  CHEMO ENDED [place] 2012 / RADIATION ENDED MAR 2013--  CURRENT ON TAMOXIFEN AND ZOLADEX  . History of idiopathic seizure    DX 2008 --  LAST ONE 2012--  NO ISSUES SINCE AND NO MEDS. -- PT STATES DOCTORS FELT IT WAS STRESS RELATED DUE TO HEALTH ISSUES  . Hot flashes due to tamoxifen   . Hx MRSA infection 03/28/11   Skin  . Left ovarian cyst   . Nocturia   . Right lower quadrant abdominal pain   . Seizures ([place])    2016 last seizure   . Status post chemotherapy    docetaxel/carboplatin/trastuzumab  . Strains to urinate   . Urgency of urination     Assessment/Plan: 1 Day Post-Op Procedure(s) (LRB): OPEN REDUCTION INTERNAL FIXATION (ORIF) TIBIAL PLATEAU FRACTURE (Right) Active Problems:   Tibial plateau fracture, right, closed, initial encounter  Estimated body mass index is 20.43 kg/m as calculated from the following:   Height as of this encounter: 5\' 4"  (1.626 m).   Weight as of this encounter: 54 kg (119 lb). Advance diet Up with therapy D/C IV fluids  DVT Prophylaxis - Aspirin and Lovenox Partial weight bearing 25-50% to the RLE. D/C O2 and pulse ox, try on room air.  Plan for discharge this afternoon after therapy sessions with HHPT. Follow-up in the office in 2 weeks with Dr. Wynelle Link.  Theresa Duty, PA-C Orthopedic Surgery 11/28/2017, 7:16 AM

## 2017-11-28 NOTE — Evaluation (Signed)
Physical Therapy Evaluation Patient Details Name: Crystal [name] MRN: [id number] DOB: 12/14/1975 Today's Date: 11/28/2017   History of Present Illness  42 y.o. female admitted with R tibial plateau fx, s/p ORIF 11/27/17. PMH breast cancer, bipolar.   Clinical Impression  Pt ambulated 37' with RW, distance limited by pain. Instructed pt in HEP. She declined stair training as she'd been doing stairs with a rail and crutch prior to admission. She is ready to DC home from PT standpoint.     Follow Up Recommendations Home health PT    Equipment Recommendations  Rolling walker with 5" wheels;3in1 (PT)    Recommendations for Other Services       Precautions / Restrictions Precautions Precautions: Fall Required Braces or Orthoses: Knee Immobilizer - Right Knee Immobilizer - Right: On at all times Restrictions Weight Bearing Restrictions: Yes RLE Weight Bearing: Partial weight bearing RLE Partial Weight Bearing Percentage or Pounds: 25-50%      Mobility  Bed Mobility Overal bed mobility: Modified Independent             General bed mobility comments: HOB up  Transfers Overall transfer level: Needs assistance Equipment used: Rolling walker (2 wheeled) Transfers: Sit to/from Stand Sit to Stand: Supervision         General transfer comment: VCs hand placement  Ambulation/Gait Ambulation/Gait assistance: Supervision Gait Distance (Feet): 80 Feet Assistive device: Rolling walker (2 wheeled) Gait Pattern/deviations: Step-to pattern     General Gait Details: pt unable to tolerate R foot flat so did toe touch WB and at times NWB, pain with minimal WB, steady with [name] loss of balance, distance limited by pain  Stairs Stairs: (pt declined stair training, stated she'd been doing stairs with 1 rail and crutch at home PTA)          Wheelchair Mobility    Modified Rankin (Stroke Patients Only)       Balance Overall balance assessment: Modified Independent                                            Pertinent Vitals/Pain Pain Assessment: 0-10 Pain Score: 7  Pain Location: R knee Pain Descriptors / Indicators: Sharp Pain Intervention(s): Limited activity within patient's tolerance;Monitored during session;Premedicated before session;Ice applied;Patient requesting pain meds-RN notified    Home Living Family/patient expects to be discharged to:: Private residence Living Arrangements: Spouse/significant other;Other relatives(mother in law) Available Help at Discharge: Family;Available 24 hours/day Type of Home: House Home Access: Stairs to enter Entrance Stairs-Rails: Left Entrance Stairs-Number of Steps: 4 Home Layout: Laundry or work area in [place]: Crutches      Prior Function Level of Independence: Independent         Comments: has used crutches for past 1 week since injury; on disability, likes to work out at [employer]        Extremity/Trunk Assessment   Upper Extremity Assessment Upper Extremity Assessment: Overall WFL for tasks assessed    Lower Extremity Assessment Lower Extremity Assessment: RLE deficits/detail RLE Deficits / Details: knee in KI, ankle AROM ~50% decr 2* pain, SLR -3/5, all limited by pain RLE: Unable to fully assess due to pain;Unable to fully assess due to immobilization RLE Sensation: WNL       Communication   Communication: [name] difficulties  Cognition Arousal/Alertness: Awake/alert Behavior During Therapy: WFL for tasks  assessed/performed Overall Cognitive Status: Within Functional Limits for tasks assessed                                        General Comments      Exercises General Exercises - Lower Extremity Ankle Circles/Pumps: AROM;Both;5 reps;Supine Quad Sets: AROM;Right;5 reps;Supine Hip ABduction/ADduction: (demonstrated standing hip ABD, ext, flexion to patient)   Assessment/Plan    PT Assessment All further PT  needs can be met in the next venue of care  PT Problem List Decreased strength;Decreased range of motion;Decreased activity tolerance;Decreased mobility;Pain       PT Treatment Interventions Gait training;DME instruction;Functional mobility training;Therapeutic exercise;Patient/family education    PT Goals (Current goals can be found in the Care Plan section)  Acute Rehab PT Goals Patient Stated Goal: working out, cleaning house PT Goal Formulation: All assessment and education complete, DC therapy    Frequency     Barriers to discharge        Co-evaluation               AM-PAC PT "6 Clicks" Daily Activity  Outcome Measure Difficulty turning over in bed (including adjusting bedclothes, sheets and blankets)?: A Little Difficulty moving from lying on back to sitting on the side of the bed? : A Little Difficulty sitting down on and standing up from a chair with arms (e.g., wheelchair, bedside commode, etc,.)?: A Little Help needed moving to and from a bed to chair (including a wheelchair)?: A Little Help needed walking in hospital room?: A Little Help needed climbing 3-5 steps with a railing? : A Little 6 Click Score: 18    End of Session Equipment Utilized During Treatment: Gait belt;Right knee immobilizer Activity Tolerance: Patient limited by pain Patient left: in bed;with call bell/phone within reach Nurse Communication: Mobility status;Patient requests pain meds PT Visit Diagnosis: Difficulty in walking, not elsewhere classified (R26.2);Pain Pain - Right/Left: Right Pain - part of body: Knee    Time: 0832-0859 PT Time Calculation (min) (ACUTE ONLY): 27 min   Charges:   PT Evaluation $PT Eval Low Complexity: 1 Low PT Treatments $Gait Training: 8-22 mins          Blondell Reveal Kistler 11/28/2017, 9:11 AM [phone]

## 2017-11-28 NOTE — Progress Notes (Signed)
Complaints of pain, controlled with PRN pain meds. Vitals stable. Foley in place.

## 2017-11-28 NOTE — Progress Notes (Signed)
Discharge planning, spoke with patient at bedside. Have chosen [facility] for [facility] PT, evaluate and treat. Contacted [facility] for referral. Needs RW, contacted [facility] to deliver to room. [phone]

## 2017-11-28 NOTE — Discharge Summary (Signed)
Physician Discharge Summary   Patient ID: Crystal [name] MRN: [id number] DOB/AGE: [birth year] 42 y.o.  Admit date: 11/27/2017 Discharge date: 11/28/2017  Primary Diagnosis: right tibial plateau fracture   Admission Diagnoses:  Past Medical History:  Diagnosis Date  . Anxiety   . Bipolar affective disorder, mixed ([place])   . Cancer ([place])   . Depression   . Family history of anesthesia complication    MOTHER PONV  . Frequency of urination   . GERD (gastroesophageal reflux disease)    OCCASIONALLY TAKE ZANTAC  . Heart palpitations   . History of breast cancer DX JUNE 2012 W/ RIGHT BREAST INVASIVE DUCTAL CA  STAGE IIB---  S/P BILATERAL MASTECTOMIES AND RIGHT NODE DISSECTION   ONCOLOGIST-- DR Jana Hakim--  CHEMO ENDED [place] 2012 / RADIATION ENDED MAR 2013--  CURRENT ON TAMOXIFEN AND ZOLADEX  . History of idiopathic seizure    DX 2008 --  LAST ONE 2012--  NO ISSUES SINCE AND NO MEDS. -- PT STATES DOCTORS FELT IT WAS STRESS RELATED DUE TO HEALTH ISSUES  . Hot flashes due to tamoxifen   . Hx MRSA infection 03/28/11   Skin  . Left ovarian cyst   . Nocturia   . Right lower quadrant abdominal pain   . Seizures ([place])    2016 last seizure   . Status post chemotherapy    docetaxel/carboplatin/trastuzumab  . Strains to urinate   . Urgency of urination    Discharge Diagnoses:   Active Problems:   Tibial plateau fracture, right, closed, initial encounter  Estimated body mass index is 20.43 kg/m as calculated from the following:   Height as of this encounter: '5\' 4"'  (1.626 m).   Weight as of this encounter: 54 kg (119 lb).  Procedure:  Procedure(s) (LRB): OPEN REDUCTION INTERNAL FIXATION (ORIF) TIBIAL PLATEAU FRACTURE (Right)   Consults: None  HPI: The patient is a 42 year old female who had a fall approximately a week and a half ago sustaining a nondisplaced right tibial plateau fracture, mainly involving the medial plateau, but also coursing laterally just past the intercondylar notch.   It was extraarticular but at high risk for displacement.  She is having significant pain with it also.  We discussed treatment options and she elected to proceed with open reduction and internal fixation.  Laboratory Data: Hospital Outpatient Visit on 11/24/2017  Component Date Value Ref Range Status  . aPTT 11/24/2017 26  24 - 36 seconds Final   Performed at [facility], [place] [address], [place], [place] [address]  . WBC 11/24/2017 9.6  4.0 - 10.5 K/uL Final  . RBC 11/24/2017 4.25  3.87 - 5.11 MIL/uL Final  . Hemoglobin 11/24/2017 13.9  12.0 - 15.0 g/dL Final  . HCT 11/24/2017 40.5  36.0 - 46.0 % Final  . MCV 11/24/2017 95.3  78.0 - 100.0 fL Final  . MCH 11/24/2017 32.7  26.0 - 34.0 pg Final  . MCHC 11/24/2017 34.3  30.0 - 36.0 g/dL Final  . RDW 11/24/2017 13.1  11.5 - 15.5 % Final  . Platelets 11/24/2017 292  150 - 400 K/uL Final   Performed at [facility], [place] [address], [place], [place] [address]  . Sodium 11/24/2017 142  135 - 145 mmol/L Final  . Potassium 11/24/2017 3.6  3.5 - 5.1 mmol/L Final  . Chloride 11/24/2017 105  98 - 111 mmol/L Final  . CO2 11/24/2017 28  22 - 32 mmol/L Final  . Glucose, Bld 11/24/2017 78  70 -  99 mg/dL Final  . BUN 11/24/2017 13  6 - 20 mg/dL Final  . Creatinine, Ser 11/24/2017 0.80  0.44 - 1.00 mg/dL Final  . Calcium 11/24/2017 9.5  8.9 - 10.3 mg/dL Final  . Total Protein 11/24/2017 7.5  6.5 - 8.1 g/dL Final  . Albumin 11/24/2017 4.3  3.5 - 5.0 g/dL Final  . AST 11/24/2017 20  15 - 41 U/L Final  . ALT 11/24/2017 11  0 - 44 U/L Final  . Alkaline Phosphatase 11/24/2017 93  38 - 126 U/L Final  . Total Bilirubin 11/24/2017 0.7  0.3 - 1.2 mg/dL Final  . GFR calc non Af Amer 11/24/2017 >60  >60 mL/min Final  . GFR calc Af Amer 11/24/2017 >60  >60 mL/min Final   Comment: (NOTE) The eGFR has been calculated using the CKD EPI equation. This calculation has not been validated in all clinical situations. eGFR's  persistently <60 mL/min signify possible Chronic Kidney Disease.   Georgiann Hahn gap 11/24/2017 9  5 - 15 Final   Performed at [facility], [place] [address], [place], [place] [address]  . Prothrombin Time 11/24/2017 12.5  11.4 - 15.2 seconds Final  . INR 11/24/2017 0.94   Final   Performed at [facility], [place] [address] [place], [place] [address]  . ABO/RH(D) 11/24/2017 O POS   Final  . Antibody Screen 11/24/2017 NEG   Final  . Sample Expiration 11/24/2017 11/30/2017   Final  . Extend sample reason 11/24/2017    Final                   Value:NO TRANSFUSIONS OR PREGNANCY IN THE PAST 3 MONTHS Performed at [facility], [place] [address] [place], [place] [address]   . MRSA, PCR 11/24/2017 NEGATIVE  NEGATIVE Final  . Staphylococcus aureus 11/24/2017 NEGATIVE  NEGATIVE Final   Comment: (NOTE) The Xpert SA Assay (FDA approved for NASAL specimens in patients 71 years of age and older), is one component of a comprehensive surveillance program. It is not intended to diagnose infection nor to guide or monitor treatment. Performed at [facility], [place] [address] [place], [place] [address]   . Preg, Serum 11/24/2017 NEGATIVE  NEGATIVE Final   Comment:        THE SENSITIVITY OF THIS METHODOLOGY IS >10 mIU/mL. Performed at [facility], [place] [address], [place], [place] [address]   . ABO/RH(D) 11/24/2017    Final                   Value:O POS Performed at [facility], [place] [address], [place], [place] [address]      X-Rays:Dg Tibia/fibula Right  Result Date: 11/20/2017 CLINICAL DATA:  Fall 5 days ago.  Pain. EXAM: RIGHT TIBIA AND FIBULA - 2 VIEW COMPARISON:  Knee series performed today FINDINGS: Proximal right tibial/tibial plateau fractures noted as described on knee series. No additional tibial or fibular abnormality. IMPRESSION: Proximal tibial/tibial plateau fractures, better seen  and described on the series. Electronically Signed   By: Rolm Baptise M.D.   On: 11/20/2017 14:31   Dg Ankle Complete Right  Result Date: 11/20/2017 CLINICAL DATA:  Fall 5 days ago.  Knee pain, swelling EXAM: RIGHT ANKLE - COMPLETE 3+ VIEW COMPARISON:  None. FINDINGS: There is no evidence of fracture, dislocation, or joint effusion. There is no evidence of arthropathy or other focal bone abnormality. Soft tissues are unremarkable. IMPRESSION: Negative.  Electronically Signed   By: Rolm Baptise M.D.   On: 11/20/2017 14:32   Dg Knee Complete 4 Views Right  Result Date: 11/20/2017 CLINICAL DATA:  Fall 5 days ago.  Right knee pain EXAM: RIGHT KNEE - COMPLETE 4+ VIEW COMPARISON:  04/18/2015 FINDINGS: There is a right tibial plateau fracture entering the knee joint within the lateral tibial plateau, then extending through the medial proximal tibial metaphysis. This likely enters the joint within the medial tibial plateau as well, seen on the oblique view. Small joint effusion. IMPRESSION: Right proximal tibial/tibial plateau fractures entering the knee joint through both the medial and lateral tibial plateaus. This also involves the medial proximal tibial metaphysis. Electronically Signed   By: Rolm Baptise M.D.   On: 11/20/2017 14:31   Dg Foot Complete Right  Result Date: 11/20/2017 CLINICAL DATA:  Fall 5 days ago.  Pain EXAM: RIGHT FOOT COMPLETE - 3+ VIEW COMPARISON:  None. FINDINGS: There is no evidence of fracture or dislocation. There is no evidence of arthropathy or other focal bone abnormality. Soft tissues are unremarkable. IMPRESSION: Negative. Electronically Signed   By: Rolm Baptise M.D.   On: 11/20/2017 14:32   Dg C-arm 1-60 Min-no Report  Result Date: 11/27/2017 Fluoroscopy was utilized by the requesting physician.  No radiographic interpretation.   Ct Extremity Lower Right Wo Contrast  Result Date: 11/20/2017 CLINICAL DATA:  Tibial plateau fracture. EXAM: CT OF THE LOWER RIGHT EXTREMITY  WITHOUT CONTRAST TECHNIQUE: Multidetector CT imaging of the right tibia and fibula was performed according to the standard protocol. COMPARISON:  Right knee and tibia/fibula x-rays from same day. FINDINGS: Bones/Joint/Cartilage Again seen is an acute, oblique, essentially nondisplaced fracture of the proximal medial tibial metaphysis with extension into the medial tibial plateau anteriorly and lateral tibial plateau posteriorly. There is no articular surface incongruity. No additional fracture identified. Small knee lipohemarthrosis. No tibiotalar joint effusion. Joint spaces are preserved. Bone mineralization is normal. Ligaments Suboptimally assessed by CT. Muscles and Tendons Unremarkable. Soft tissues Mild soft tissue swelling around the knee extending along the medial lower leg into the ankle. IMPRESSION: 1. Essentially nondisplaced fracture of the proximal medial tibial metaphysis with extension into the medial and lateral tibial plateaus (Schatzker type IV fracture). 2. Small knee lipohemarthrosis. Electronically Signed   By: Titus Dubin M.D.   On: 11/20/2017 18:17    EKG: Orders placed or performed during the hospital encounter of 10/14/11  . EKG 12-Lead  . EKG 12-Lead  . ED EKG  . ED EKG  . EKG 12-Lead  . EKG 12-Lead  . EKG     Hospital Course: DAINE CROKER is a 42 y.o. who was admitted to [facility]. They were brought to the operating room on 11/27/2017 and underwent Procedure(s): OPEN REDUCTION INTERNAL FIXATION (ORIF) TIBIAL PLATEAU FRACTURE.  Patient tolerated the procedure well and was later transferred to the recovery room and then to the orthopaedic floor for postoperative care.  They were given PO and IV analgesics for pain control following their surgery.  They were given 24 hours of postoperative antibiotics of  Anti-infectives (From admission, onward)   Start     Dose/Rate Route Frequency Ordered Stop   11/27/17 2300  ceFAZolin (ANCEF) IVPB 1 g/50 mL premix      1 g 100 mL/hr over 30 Minutes Intravenous Every 6 hours 11/27/17 1942 11/28/17 1045   11/27/17 1430  ceFAZolin (ANCEF) IVPB 2g/100 mL premix     2 g 200 mL/hr over 30  Minutes Intravenous On call to O.R. 11/27/17 1420 11/27/17 1705     and started on DVT prophylaxis in the form of Lovenox.   PT was ordered with PWB 25-50% to the RLE protocol. Discharge planning consulted to help with postop disposition and equipment needs.  Patient had a good night on the evening of surgery. She was seen during rounds on POD #1 and was ready to go home. She completed one session of physical therapy and was discharged to home later that day in stable condition. Pt will discontinue Lovenox upon d/c home and begin three weeks of Aspirin 325 mg QD for DVT prophylaxis. Instructed patient to change surgical dressing on POD #2 and that she may shower on POD #3.   Diet: Regular diet Activity:PWB 25-50% to the RLE Follow-up: in 2 weeks with Dr. Wynelle Link Disposition - Home with HHPT Discharged Condition: stable   Discharge Instructions    Call MD / Call 911   Complete by:  As directed    If you experience chest pain or shortness of breath, CALL 911 and be transported to the hospital emergency room.  If you develope a fever above 101 F, pus (white drainage) or increased drainage or redness at the wound, or calf pain, call your surgeon's office.   Constipation Prevention   Complete by:  As directed    Drink plenty of fluids.  Prune juice may be helpful.  You may use a stool softener, such as Colace (over the counter) 100 mg twice a day.  Use MiraLax (over the counter) for constipation as needed.   Diet - low sodium heart healthy   Complete by:  As directed    Discharge instructions   Complete by:  As directed    BLOOD CLOT PREVENTION: take one 325 mg aspirin once a day for 3 weeks following surgery  SHOWERING AFTER SURGERY: change dressing on the knee beginning Wednesday (11/29/2017), apply a 4x4 gauze cover with paper  tape. You may shower beginning Thursday (11/30/2017). Do not scrub the incision. Pat dry with a towel.  WEIGHT-BEARING STATUS: Partial-weight bearing 25-50% to the right leg.   Driving restrictions   Complete by:  As directed    No driving for 6 weeks   Partial weight bearing   Complete by:  As directed      Allergies as of 11/28/2017      Reactions   Tramadol Other (See Comments)   Seizures   Phenytoin Nausea And Vomiting   Thorazine [chlorpromazine Hcl] Hives   Ciprofloxacin Nausea And Vomiting   Vancomycin Rash   Possible rash reported by MD      Medication List    STOP taking these medications   oxyCODONE-acetaminophen 5-325 MG tablet Commonly known as:  PERCOCET/ROXICET     TAKE these medications   albuterol 108 (90 Base) MCG/ACT inhaler Commonly known as:  PROVENTIL HFA;VENTOLIN HFA Inhale 1 puff into the lungs every 4 (four) hours as needed for wheezing or shortness of breath.   ALPRAZolam 1 MG tablet Commonly known as:  XANAX TAKE 1 TABLET BY MOUTH 3 TIMES A DAY AS NEEDED FOR ANXIETY What changed:    how much to take  how to take this  when to take this  additional instructions   aspirin 325 MG tablet Take 1 tablet (325 mg total) by mouth daily. Take one 325 mg tablet once a day for three weeks following surgery to prevent blood clots. Start taking on:  11/29/2017   lamoTRIgine 150  MG tablet Commonly known as:  LAMICTAL Take 300 mg by mouth at bedtime.   methocarbamol 500 MG tablet Commonly known as:  ROBAXIN Take 1 tablet (500 mg total) by mouth every 6 (six) hours as needed for muscle spasms.   oxyCODONE 5 MG immediate release tablet Commonly known as:  Oxy IR/ROXICODONE Take 1-2 tablets (5-10 mg total) by mouth every 6 (six) hours as needed for moderate pain or severe pain (pain score 4-6).   predniSONE 5 MG tablet Commonly known as:  DELTASONE Take 1 tablet (5 mg total) by mouth daily with breakfast.            Durable Medical Equipment   (From admission, onward)        Start     Ordered   11/28/17 1135  For home use only DME Walker rolling  Once    Question:  Patient needs a walker to treat with the following condition  Answer:  Weakness generalized   11/28/17 1134       Discharge Care Instructions  (From admission, onward)        Start     Ordered   11/28/17 0000  Partial weight bearing     11/28/17 2505     Follow-up Information    Gaynelle Arabian, MD. Schedule an appointment as soon as possible for a visit on 12/12/2017.   Specialty:  Orthopedic Surgery Contact information: [address] [place] [address] [phone]        [facility] up.   Specialty:  Home Health Services Why:  physical therapy Contact information: [address] [place] [address] [place] Follow up.   Why:  walker Contact information: [address] [place] [address] [phone]           Signed: Theresa Duty, PA-C Orthopaedic Surgery 11/28/2017, 3:16 PM

## 2017-11-28 NOTE — Op Note (Signed)
NAME: Crystal [name], Crystal [name] MEDICAL RECORD ZO:[id number] ACCOUNT [id number] DATE OF BIRTH:06/05/1975 FACILITY: [facility] LOCATION: [facility] PHYSICIAN:Elyna Pangilinan Zella Ball, MD  OPERATIVE REPORT  DATE OF PROCEDURE:  11/27/2017  PREOPERATIVE DIAGNOSIS:  Right tibial plateau fracture.  POSTOPERATIVE DIAGNOSIS:  Right tibial plateau fracture.  PROCEDURE:  Open reduction internal fixation of right tibial plateau fracture.  SURGEON:  Gaynelle Arabian, MD  ASSISTANT:  Ladene Artist, PA-C.  ANESTHESIA:  General.  ESTIMATED BLOOD LOSS:  Minimal.  DRAIN:  None.  TOURNIQUET TIME:  36 minutes at 300 mmHg.  COMPLICATIONS:  None.  CONDITION:  Stable to recovery.  BRIEF CLINICAL NOTE:  The patient is a 42 year old female who had a fall approximately a week and a half ago sustaining a nondisplaced right tibial plateau fracture, mainly involving the medial plateau, but also coursing laterally just past the  intercondylar notch.  It was extraarticular but at high risk for displacement.  She is having significant pain with it also.  We discussed treatment options and she elected to proceed with open reduction and internal fixation.  DESCRIPTION OF FINDINGS:  After successful administration of general anesthetic, a tourniquet was placed high on the right thigh.  Right lower extremity prepped and draped in the usual sterile fashion.  Extremity is wrapped in Esmarch and tourniquet  inflated to 300 mmHg.  Midline incision was made starting about 4 cm below the tibial tubercle up to the joint line and then coursing perpendicular to this and medially.  The fracture was mainly on the medial plateau and I wanted to gain access there.   Skin was cut with a 10 blade through subcutaneous tissue.  The periosteum was incised and elevated.  The fracture line was identified and it is nondisplaced.  I placed 2 guide pins for the 6.5 mm cannulated screw about a cm below the joint line coursing  from medial to lateral.  The lengths  were 65 and 70 mm.  The 6.5 cannulated screws were passed over the guide pins and effectively maintained reduction of this fracture.  We then placed a Y shaped buttress plate medially, contouring the plate first and  then placing 3 cancellous screws proximally and 2 cortical screws distally to serve as a buttress medially to prevent collapse of this fracture.  Hardware was found to be in good position in AP, lateral and oblique views.  It is all extraarticular and  all in good position.  We thoroughly irrigated the wound with saline solution and then released the tourniquet for a total time of 36 minutes.  Periosteum was reapproximated with 2-0 Vicryl to cover the plate and subcutaneous closed with 2-0 Vicryl and  subcuticular running 4-0 Monocryl.  I placed 25 mL of 0.25% Marcaine with epinephrine into the subcutaneous tissue prior to closing.  After the incision was closed, it was cleaned and dried and Steri-Strips and a bulky sterile dressing applied.  She was  placed into a knee immobilizer, awakened and transported to recovery in stable condition.  Note that a surgical assistant was a medical necessity for this procedure to assist in exposure and protect vital neurovascular structures and maintaining reduction of the fracture while I was placing the hardware and to help with closure.  TN/NUANCE  D:11/27/2017 T:11/28/2017 JOB:[id number]

## 2017-11-28 NOTE — Progress Notes (Signed)
Patient discharged to home with family. Given all belongings, instructions, prescriptions, equipment. Patient and husband verbalized understanding of instructions. Escorted to pov via w/c. 

## 2017-11-28 NOTE — Discharge Instructions (Signed)
Dr. Gaynelle Arabian [facility] Specialist [facility] [address], [place], [place] [address] [phone]  [place]   Remove items at home which could result in a fall. This includes throw rugs or furniture in walking pathways.   ICE to the affected knee every three hours for 30 minutes at a time and then as needed for pain and swelling.  Continue to use ice on the knee for pain and swelling from surgery. You may notice swelling that will progress down to the foot and ankle.  This is normal after surgery.  Elevate the leg when you are not up walking on it.    Continue to use the breathing machine which will help keep your temperature down.  It is common for your temperature to cycle up and down following surgery, especially at night when you are not up moving around and exerting yourself.  The breathing machine keeps your lungs expanded and your temperature down.  DRESSING / WOUND CARE / SHOWERING You may shower 3 days after surgery, but keep the wounds dry during showering.  You may use an occlusive plastic wrap (Press'n Seal for example), NO SOAKING/SUBMERGING IN THE BATHTUB.  If the bandage gets wet, change with a clean dry gauze.  If the incision gets wet, pat the wound dry with a clean towel. You may start showering once you are discharged home but do not submerge the incision under water. Just pat the incision dry and apply a dry gauze dressing on daily. Change the surgical dressing daily and reapply a dry dressing each time.  ACTIVITY Walk with your walker as instructed. Use walker as long as suggested by your caregivers. Avoid periods of inactivity such as sitting longer than an hour when not asleep. This helps prevent blood clots.  Do not drive a car for 6 weeks or until released by you surgeon.  Do not drive while taking narcotics.  WEIGHT BEARING Partial weight-bearing to the right  lower extremity with assist device (walker, cane, etc) as directed, use it as long as suggested by your surgeon or therapist, typically at least 4-6 weeks.  MEDICATIONS See your medication summary on the After Visit Summary that the nursing staff will review with you prior to discharge.  You may have some home medications which will be placed on hold until you complete the course of blood thinner medication.  It is important for you to complete the blood thinner medication as prescribed by your surgeon.  Continue your approved medications as instructed at time of discharge.                                                 FOLLOW-UP APPOINTMENTS Make sure you keep all of your appointments after your operation with your surgeon and caregivers. You should call the office at the above phone number and make an appointment for approximately two weeks after the date of your surgery or on the date instructed by your surgeon outlined in the "After Visit Summary".  MAKE SURE YOU:   Understand these instructions.   Get help right away if you are not doing well or get worse.   Do not submerge incision under water. Please use good hand washing techniques while changing dressing each day. May shower starting three days after surgery.  Please use a clean towel to pat the incision dry following showers. Continue to use ice for pain and swelling after surgery. Do not use any lotions or creams on the incision until instructed by your surgeon.

## 2018-01-03 ENCOUNTER — Telehealth: Payer: Self-pay | Admitting: Oncology

## 2018-01-03 NOTE — Telephone Encounter (Signed)
Returned pts call to r/s appts   °

## 2018-01-04 ENCOUNTER — Ambulatory Visit: Payer: Medicare Other

## 2018-01-04 ENCOUNTER — Other Ambulatory Visit: Payer: Medicare Other

## 2018-02-01 ENCOUNTER — Inpatient Hospital Stay: Payer: Medicare Other | Attending: Oncology

## 2018-02-01 DIAGNOSIS — M79601 Pain in right arm: Secondary | ICD-10-CM | POA: Diagnosis not present

## 2018-02-01 DIAGNOSIS — M797 Fibromyalgia: Secondary | ICD-10-CM | POA: Insufficient documentation

## 2018-02-01 DIAGNOSIS — Z853 Personal history of malignant neoplasm of breast: Secondary | ICD-10-CM | POA: Insufficient documentation

## 2018-02-01 DIAGNOSIS — Z923 Personal history of irradiation: Secondary | ICD-10-CM | POA: Diagnosis not present

## 2018-02-01 DIAGNOSIS — F17210 Nicotine dependence, cigarettes, uncomplicated: Secondary | ICD-10-CM | POA: Insufficient documentation

## 2018-02-01 DIAGNOSIS — G8929 Other chronic pain: Secondary | ICD-10-CM | POA: Insufficient documentation

## 2018-02-01 DIAGNOSIS — G40909 Epilepsy, unspecified, not intractable, without status epilepticus: Secondary | ICD-10-CM | POA: Insufficient documentation

## 2018-02-01 DIAGNOSIS — Z170 Estrogen receptor positive status [ER+]: Secondary | ICD-10-CM

## 2018-02-01 DIAGNOSIS — Z7952 Long term (current) use of systemic steroids: Secondary | ICD-10-CM | POA: Insufficient documentation

## 2018-02-01 DIAGNOSIS — G43909 Migraine, unspecified, not intractable, without status migrainosus: Secondary | ICD-10-CM | POA: Insufficient documentation

## 2018-02-01 DIAGNOSIS — Z9012 Acquired absence of left breast and nipple: Secondary | ICD-10-CM | POA: Insufficient documentation

## 2018-02-01 DIAGNOSIS — C50411 Malignant neoplasm of upper-outer quadrant of right female breast: Secondary | ICD-10-CM

## 2018-02-01 LAB — COMPREHENSIVE METABOLIC PANEL
ALT: 25 U/L (ref 0–44)
AST: 23 U/L (ref 15–41)
Albumin: 4.5 g/dL (ref 3.5–5.0)
Alkaline Phosphatase: 107 U/L (ref 38–126)
Anion gap: 11 (ref 5–15)
BILIRUBIN TOTAL: 0.5 mg/dL (ref 0.3–1.2)
BUN: 15 mg/dL (ref 6–20)
CO2: 26 mmol/L (ref 22–32)
Calcium: 10 mg/dL (ref 8.9–10.3)
Chloride: 103 mmol/L (ref 98–111)
Creatinine, Ser: 0.82 mg/dL (ref 0.44–1.00)
Glucose, Bld: 84 mg/dL (ref 70–99)
POTASSIUM: 3.9 mmol/L (ref 3.5–5.1)
Sodium: 140 mmol/L (ref 135–145)
TOTAL PROTEIN: 7.4 g/dL (ref 6.5–8.1)

## 2018-02-01 LAB — CBC WITH DIFFERENTIAL/PLATELET
BASOS ABS: 0.1 10*3/uL (ref 0.0–0.1)
Basophils Relative: 1 %
Eosinophils Absolute: 0.1 10*3/uL (ref 0.0–0.5)
Eosinophils Relative: 1 %
HEMATOCRIT: 50.3 % — AB (ref 34.8–46.6)
Hemoglobin: 17.1 g/dL — ABNORMAL HIGH (ref 11.6–15.9)
LYMPHS PCT: 21 %
Lymphs Abs: 2.7 10*3/uL (ref 0.9–3.3)
MCH: 33.1 pg (ref 25.1–34.0)
MCHC: 34.1 g/dL (ref 31.5–36.0)
MCV: 97.3 fL (ref 79.5–101.0)
Monocytes Absolute: 0.8 10*3/uL (ref 0.1–0.9)
Monocytes Relative: 6 %
Neutro Abs: 9.3 10*3/uL — ABNORMAL HIGH (ref 1.5–6.5)
Neutrophils Relative %: 71 %
PLATELETS: 254 10*3/uL (ref 145–400)
RBC: 5.17 MIL/uL (ref 3.70–5.45)
RDW: 13 % (ref 11.2–14.5)
WBC: 13 10*3/uL — AB (ref 3.9–10.3)

## 2018-02-02 LAB — THYROID PANEL WITH TSH
Free Thyroxine Index: 2.6 (ref 1.2–4.9)
T3 Uptake Ratio: 29 % (ref 24–39)
T4, Total: 9 ug/dL (ref 4.5–12.0)
TSH: 1.55 u[IU]/mL (ref 0.450–4.500)

## 2018-05-22 ENCOUNTER — Telehealth: Payer: Self-pay | Admitting: *Deleted

## 2018-05-22 NOTE — Telephone Encounter (Signed)
FYI "[name] [phone]).  I was on an injection that caused menopause.  Stopped (Zoladex) , having periods and now no periods.  Do I need my hormones checked?  Is the Zoladex still affecting me?  Could I be pregnant?  I started December 5th and December 31st but nothing for the month of January.  Both with my normal cramping and heaviness with both periods lasting about a week which [place] the same as before injections.  Not using birth control because I am not able to take hormones."  Informed menustration cycles every twenty-eight days. Cycle can vary as long as thirty-five days or start a few days early.  Confirmed dates "I write the dates for my records."  A little to early.  Expect period January 28 through February 3rd.  "I"ll wait to see what happens and call back if needed.  Not concerned about pregnancy.  I'm 43 years old."

## 2018-05-31 ENCOUNTER — Other Ambulatory Visit (HOSPITAL_COMMUNITY): Payer: Self-pay | Admitting: *Deleted

## 2018-05-31 NOTE — Patient Instructions (Addendum)
Crystal [name]  05/31/2018   Your procedure is scheduled on: 06-13-2018  Report to [facility] Main  Entrance  Report to admitting at 145 PM    Call this number if you have problems the morning of surgery [phone]   Remember: Do not eat food :After Midnight. CLEAR LIQUIDS FROM MIDNIGHT UNTIL 1000 AM, NOTHING BY MOUTH AFTER 1000 AM. BRUSH YOUR TEETH MORNING OF SURGERY AND RINSE YOUR MOUTH OUT, NO CHEWING GUM CANDY OR MINTS.     CLEAR LIQUID DIET   Foods Allowed                                                                     Foods Excluded  Coffee and tea, regular and decaf                             liquids that you cannot  Plain Jell-O in any flavor                                             see through such as: Fruit ices (not with fruit pulp)                                     milk, soups, orange juice  Iced Popsicles                                    All solid food Carbonated beverages, regular and diet                                    Cranberry, grape and apple juices Sports drinks like Gatorade Lightly seasoned clear broth or consume(fat free) Sugar, honey syrup  Sample Menu Breakfast                                Lunch                                     Supper Cranberry juice                    Beef broth                            Chicken broth Jell-O                                     Grape juice  Apple juice Coffee or tea                        Jell-O                                      Popsicle                                                Coffee or tea                        Coffee or tea  _____________________________________________________________________   , GABAPENTIN, NEXIUM  Take these medicines the morning of surgery with A SIP OF WATER: ALBUTEROL INHALER IF NEEDED AND BRING INHALER WITH YOU, ALPRAZOLAM (XANAX ) IF NEEDED                                You may not have any metal on your  body including hair pins and              piercings  Do not wear jewelry, make-up, lotions, powders or perfumes, deodorant             Do not wear nail polish.  Do not shave  48 hours prior to surgery.               Do not bring valuables to the hospital. [place].  Contacts, dentures or bridgework may not be worn into surgery.  Leave suitcase in the car. After surgery it may be brought to your room.     _____________________________________________________________________   [facility] - Preparing for Surgery Before surgery, you can play an important role.  Because skin is not sterile, your skin needs to be as free of germs as possible.  You can reduce the number of germs on your skin by washing with CHG (chlorahexidine gluconate) soap before surgery.  CHG is an antiseptic cleaner which kills germs and bonds with the skin to continue killing germs even after washing. Please DO NOT use if you have an allergy to CHG or antibacterial soaps.  If your skin becomes reddened/irritated stop using the CHG and inform your nurse when you arrive at Short Stay. Do not shave (including legs and underarms) for at least 48 hours prior to the first CHG shower.  You may shave your face/neck. Please follow these instructions carefully:  1.  Shower with CHG Soap the night before surgery and the  morning of Surgery.  2.  If you choose to wash your hair, wash your hair first as usual with your  normal  shampoo.  3.  After you shampoo, rinse your hair and body thoroughly to remove the  shampoo.                           4.  Use CHG as you would any other liquid soap.  You can apply chg directly  to the skin and wash  Gently with a scrungie or clean washcloth.  5.  Apply the CHG Soap to your body ONLY FROM THE NECK DOWN.   Do not use on face/ open                           Wound or open sores. Avoid contact with eyes, ears mouth and genitals  (private parts).                       Wash face,  Genitals (private parts) with your normal soap.             6.  Wash thoroughly, paying special attention to the area where your surgery  will be performed.  7.  Thoroughly rinse your body with warm water from the neck down.  8.  DO NOT shower/wash with your normal soap after using and rinsing off  the CHG Soap.                9.  Pat yourself dry with a clean towel.            10.  Wear clean pajamas.            11.  Place clean sheets on your bed the night of your first shower and do not  sleep with pets. Day of Surgery : Do not apply any lotions/deodorants the morning of surgery.  Please wear clean clothes to the hospital/surgery center.  FAILURE TO FOLLOW THESE INSTRUCTIONS MAY RESULT IN THE CANCELLATION OF YOUR SURGERY PATIENT SIGNATURE_________________________________  NURSE SIGNATURE__________________________________  ________________________________________________________________________   Crystal [name]  An incentive spirometer is a tool that can help keep your lungs clear and active. This tool measures how well you are filling your lungs with each breath. Taking long deep breaths may help reverse or decrease the chance of developing breathing (pulmonary) problems (especially infection) following:  A long period of time when you are unable to move or be active. BEFORE THE PROCEDURE   If the spirometer includes an indicator to show your best effort, your nurse or respiratory therapist will set it to a desired goal.  If possible, sit up straight or lean slightly forward. Try not to slouch.  Hold the incentive spirometer in an upright position. INSTRUCTIONS FOR USE  1. Sit on the edge of your bed if possible, or sit up as far as you can in bed or on a chair. 2. Hold the incentive spirometer in an upright position. 3. Breathe out normally. 4. Place the mouthpiece in your mouth and seal your lips tightly around  it. 5. Breathe in slowly and as deeply as possible, raising the piston or the ball toward the top of the column. 6. Hold your breath for 3-5 seconds or for as long as possible. Allow the piston or ball to fall to the bottom of the column. 7. Remove the mouthpiece from your mouth and breathe out normally. 8. Rest for a few seconds and repeat Steps 1 through 7 at least 10 times every 1-2 hours when you are awake. Take your time and take a few normal breaths between deep breaths. 9. The spirometer may include an indicator to show your best effort. Use the indicator as a goal to work toward during each repetition. 10. After each set of 10 deep breaths, practice coughing to be sure your lungs are clear. If you have an incision (the cut made at the time of  surgery), support your incision when coughing by placing a pillow or rolled up towels firmly against it. Once you are able to get out of bed, walk around indoors and cough well. You may stop using the incentive spirometer when instructed by your caregiver.  RISKS AND COMPLICATIONS  Take your time so you do not get dizzy or light-headed.  If you are in pain, you may need to take or ask for pain medication before doing incentive spirometry. It is harder to take a deep breath if you are having pain. AFTER USE  Rest and breathe slowly and easily.  It can be helpful to keep track of a log of your progress. Your caregiver can provide you with a simple table to help with this. If you are using the spirometer at home, follow these instructions: [place] IF:   You are having difficultly using the spirometer.  You have trouble using the spirometer as often as instructed.  Your pain medication is not giving enough relief while using the spirometer.  You develop fever of 100.5 F (38.1 C) or higher. SEEK IMMEDIATE MEDICAL CARE IF:   You cough up bloody sputum that had not been present before.  You develop fever of 102 F (38.9 C) or  greater.  You develop worsening pain at or near the incision site. MAKE SURE YOU:   Understand these instructions.  Will watch your condition.  Will get help right away if you are not doing well or get worse. Document Released: 08/29/2006 Document Revised: 07/11/2011 Document Reviewed: 10/30/2006 Exit[employer] Patient Information 2014 [employer], [place].   ________________________________________________________________________  WHAT IS A BLOOD TRANSFUSION? Blood Transfusion Information  A transfusion is the replacement of blood or some of its parts. Blood is made up of multiple cells which provide different functions.  Red blood cells carry oxygen and are used for blood loss replacement.  White blood cells fight against infection.  Platelets control bleeding.  Plasma helps clot blood.  Other blood products are available for specialized needs, such as hemophilia or other clotting disorders. BEFORE THE TRANSFUSION  Who gives blood for transfusions?   Healthy volunteers who are fully evaluated to make sure their blood is safe. This is blood bank blood. Transfusion therapy is the safest it has ever been in the practice of medicine. Before blood is taken from a donor, a complete history is taken to make sure that person has no history of diseases nor engages in risky social behavior (examples are intravenous drug use or sexual activity with multiple partners). The donor's travel history is screened to minimize risk of transmitting infections, such as malaria. The donated blood is tested for signs of infectious diseases, such as HIV and hepatitis. The blood is then tested to be sure it is compatible with you in order to minimize the chance of a transfusion reaction. If you or a relative donates blood, this is often done in anticipation of surgery and is not appropriate for emergency situations. It takes many days to process the donated blood. RISKS AND COMPLICATIONS Although transfusion therapy  is very safe and saves many lives, the main dangers of transfusion include:   Getting an infectious disease.  Developing a transfusion reaction. This is an allergic reaction to something in the blood you were given. Every precaution is taken to prevent this. The decision to have a blood transfusion has been considered carefully by your caregiver before blood is given. Blood is not given unless the benefits outweigh the risks. AFTER THE  TRANSFUSION  Right after receiving a blood transfusion, you will usually feel much better and more energetic. This is especially true if your red blood cells have gotten low (anemic). The transfusion raises the level of the red blood cells which carry oxygen, and this usually causes an energy increase.  The nurse administering the transfusion will monitor you carefully for complications. HOME CARE INSTRUCTIONS  No special instructions are needed after a transfusion. You may find your energy is better. Speak with your caregiver about any limitations on activity for underlying diseases you may have. SEEK MEDICAL CARE IF:   Your condition is not improving after your transfusion.  You develop redness or irritation at the intravenous (IV) site. SEEK IMMEDIATE MEDICAL CARE IF:  Any of the following symptoms occur over the next 12 hours:  Shaking chills.  You have a temperature by mouth above 102 F (38.9 C), not controlled by medicine.  Chest, back, or muscle pain.  People around you feel you are not acting correctly or are confused.  Shortness of breath or difficulty breathing.  Dizziness and fainting.  You get a rash or develop hives.  You have a decrease in urine output.  Your urine turns a dark color or changes to pink, red, or brown. Any of the following symptoms occur over the next 10 days:  You have a temperature by mouth above 102 F (38.9 C), not controlled by medicine.  Shortness of breath.  Weakness after normal activity.  The white  part of the eye turns yellow (jaundice).  You have a decrease in the amount of urine or are urinating less often.  Your urine turns a dark color or changes to pink, red, or brown. Document Released: 04/15/2000 Document Revised: 07/11/2011 Document Reviewed: 12/03/2007 [facility] Patient Information 2014 [place], [place].  _______________________________________________________________________

## 2018-05-31 NOTE — Progress Notes (Signed)
CHEST XRAY 06-20-17 EPIC

## 2018-06-06 NOTE — H&P (Signed)
TOTAL HIP ADMISSION H&P  Patient is admitted for left total hip arthroplasty.  Subjective:  Chief Complaint: left hip pain  HPI: Crystal [name], 43 y.o. female, has a history of pain and functional disability in the left hip(s) due to avascular necrosis and patient has failed non-surgical conservative treatments for greater than 12 weeks to include NSAID's and/or analgesics and activity modification.  Onset of symptoms was abrupt starting 6 months ago with gradually worsening course since that time.The patient noted no past surgery on the left hip(s).  Patient currently rates pain in the left hip at 8 out of 10 with activity. Patient has worsening of pain with activity and weight bearing, pain that interfers with activities of daily living and pain with passive range of motion. Patient has evidence of an area of avascular necrosis with a small area of collapse of the femoral head by imaging studies. This condition presents safety issues increasing the risk of falls. There is no current active infection.  Patient Active Problem List   Diagnosis Date Noted  . Tibial plateau fracture, right, closed, initial encounter 11/27/2017  . Anorexia nervosa 08/19/2014  . Back pain 08/19/2014  . Malnutrition, calorie ([place]) 12/03/2013  . Migraines 05/21/2013  . Breast cancer of upper-outer quadrant of right female breast ([place]) 03/21/2013  . Chronic pain 01/09/2013  . DVT of upper extremity (deep vein thrombosis) ([place]) 01/02/2013  . Arm edema 12/27/2012  . Neuropathic pain 12/27/2012  . Chronic female pelvic pain 08/21/2012  . Fatigue 08/20/2012  . Chemotherapy induced cardiomyopathy ([place]) 09/08/2011  . Generalized convulsive seizure ([place]) 07/13/2011  . Seizure disorder ([place]) 07/13/2011  . Migraine 07/13/2011  . Anxiety disorder 07/13/2011  . Depression 07/13/2011  . Hx MRSA infection 04/13/2011   Past Medical History:  Diagnosis Date  . Anxiety   . Bipolar affective disorder, mixed ([place])   .  Cancer ([place])   . Depression   . Family history of anesthesia complication    MOTHER PONV  . Frequency of urination   . GERD (gastroesophageal reflux disease)    OCCASIONALLY TAKE ZANTAC  . Heart palpitations   . History of breast cancer DX JUNE 2012 W/ RIGHT BREAST INVASIVE DUCTAL CA  STAGE IIB---  S/P BILATERAL MASTECTOMIES AND RIGHT NODE DISSECTION   ONCOLOGIST-- DR Jana Hakim--  CHEMO ENDED [place] 2012 / RADIATION ENDED MAR 2013--  CURRENT ON TAMOXIFEN AND ZOLADEX  . History of idiopathic seizure    DX 2008 --  LAST ONE 2012--  NO ISSUES SINCE AND NO MEDS. -- PT STATES DOCTORS FELT IT WAS STRESS RELATED DUE TO HEALTH ISSUES  . Hot flashes due to tamoxifen   . Hx MRSA infection 03/28/11   Skin  . Left ovarian cyst   . Nocturia   . Right lower quadrant abdominal pain   . Seizures ([place])    2016 last seizure   . Status post chemotherapy    docetaxel/carboplatin/trastuzumab  . Strains to urinate   . Urgency of urination     Past Surgical History:  Procedure Laterality Date  . BIOPSY BREAST  10/14/2010   right needle core biopsy  . BREAST SURGERY  11/16/2010   bilateral mastectomy+ right axillary node dissection,T1cN1a, Her2+,ERPR+  (right breast invasive ductal carcinoma)  . CYSTO WITH HYDRODISTENSION N/A 07/23/2012   Procedure: CYSTOSCOPY/HYDRODISTENSION instillation of marcaine and pyridium;  Surgeon: Reece Packer, MD;  Location: [facility];  Service: Urology;  Laterality: N/A;  INSTILLATION    . I&D EXTREMITY  09/26/2011   Procedure: IRRIGATION AND DEBRIDEMENT EXTREMITY;  Surgeon: Tennis Must, MD;  Location: [facility];  Service: Orthopedics;  Laterality: Right;  I&D right hand cat bite wound  . ORIF TIBIA PLATEAU Right 11/27/2017   Procedure: OPEN REDUCTION INTERNAL FIXATION (ORIF) TIBIAL PLATEAU FRACTURE;  Surgeon: Gaynelle Arabian, MD;  Location: [facility];  Service: Orthopedics;  Laterality: Right;  . PORTACATH PLACEMENT  11/16/2010   placement of left subclavian port   . right knee torn meniscus surgery     . TRANSTHORACIC ECHOCARDIOGRAM  05-28-2012   LVF NORMAL/  EF 55-60%  . WISDOM TOOTH EXTRACTION      No current facility-administered medications for this encounter.    Current Outpatient Medications  Medication Sig Dispense Refill Last Dose  . albuterol (PROVENTIL HFA;VENTOLIN HFA) 108 (90 Base) MCG/ACT inhaler Inhale 2 puffs into the lungs every 4 (four) hours as needed for wheezing or shortness of breath.   0 More than a month at Unknown time  . ALPRAZolam (XANAX) 1 MG tablet TAKE 1 TABLET BY MOUTH 3 TIMES A DAY AS NEEDED FOR ANXIETY (Patient taking differently: Take 1 mg by mouth 3 (three) times daily. ) 90 tablet 1 11/27/2017 at 0900  . lamoTRIgine (LAMICTAL) 150 MG tablet Take 300 mg by mouth at bedtime.  2 Past Month at Unknown time  . meloxicam (MOBIC) 15 MG tablet Take 15 mg by mouth daily.      Facility-Administered Medications Ordered in Other Encounters  Medication Dose Route Frequency Provider Last Rate Last Dose  . goserelin (ZOLADEX) injection 10.8 mg  10.8 mg Subcutaneous Q90 days [name], Virgie Dad, MD   10.8 mg at 02/02/16 1501   Allergies  Allergen Reactions  . Tramadol Other (See Comments)    Seizures  . Phenytoin Nausea And Vomiting  . Thorazine [Chlorpromazine Hcl] Hives  . Ciprofloxacin Nausea And Vomiting  . Vancomycin Rash and Other (See Comments)    Possible rash reported by MD    Social History   Tobacco Use  . Smoking status: Current Every Day Smoker    Packs/day: 1.00    Years: 20.00    Pack years: 20.00    Types: Cigarettes  . Smokeless tobacco: Never Used  Substance Use Topics  . Alcohol use: Yes    Comment: 15 beers per week     Family History  Problem Relation Age of Onset  . Cancer Maternal Grandfather   . Mental illness Brother   . Mental illness Son      Review of Systems  Constitutional: Negative for chills and fever.  HENT: Negative for congestion, sore throat and tinnitus.   Eyes: Negative  for double vision, photophobia and pain.  Respiratory: Negative for cough, shortness of breath and wheezing.   Cardiovascular: Negative for chest pain, palpitations and orthopnea.  Gastrointestinal: Negative for heartburn, nausea and vomiting.  Genitourinary: Negative for dysuria, frequency and urgency.  Musculoskeletal: Positive for joint pain.  Neurological: Negative for dizziness, weakness and headaches.    Objective:  Physical Exam  Well nourished and well developed. Antalgic gait with use of walker. General: Alert and oriented x3, cooperative and pleasant, no acute distress. Head: normocephalic, atraumatic, neck supple. Eyes: EOMI. Respiratory: breath sounds clear in all fields, no wheezing, rales, or rhonchi. Cardiovascular: Regular rate and rhythm, no murmurs, gallops or rubs.  Abdomen: non-tender to palpation and soft, normoactive bowel sounds. Musculoskeletal:  Left Hip Exam: ROM: Marked pain on any flexion or rotation of the left hip. ROM  of the hip reproduces the pain she is experiencing down her leg.  There is no tenderness over the greater trochanter bursa.  Calves soft and nontender. Motor function intact in LE. Strength 5/5 LE bilaterally.  Neuro: Distal pulses 2+. Sensation to light touch intact in LE.  Vital signs in last 24 hours: Blood pressure: 100/70 mmHg Pulse: 96 bpm  Labs:  Estimated body mass index is 20.43 kg/m as calculated from the following:   Height as of 11/27/17: _0  (1.626 m).   Weight as of 11/27/17: 54 kg.   Imaging Review Plain radiographs demonstrate advanced avascular necrosis of the left hip(s). The bone quality appears to be adequate for age and reported activity level.  Preoperative templating of the joint replacement has been completed, documented, and submitted to the Operating Room personnel in order to optimize intra-operative equipment management.  Assessment/Plan:  End stage arthritis, left hip(s)  The patient history,  physical examination, clinical judgement of the provider and imaging studies are consistent with end stage degenerative joint disease of the left hip(s) and total hip arthroplasty is deemed medically necessary. The treatment options including medical management, injection therapy, arthroscopy and arthroplasty were discussed at length. The risks and benefits of total hip arthroplasty were presented and reviewed. The risks due to aseptic loosening, infection, stiffness, dislocation/subluxation,  thromboembolic complications and other imponderables were discussed.  The patient acknowledged the explanation, agreed to proceed with the plan and consent was signed. Patient is being admitted for inpatient treatment for surgery, pain control, PT, OT, prophylactic antibiotics, VTE prophylaxis, progressive ambulation and ADL's and discharge planning.The patient is planning to be discharged home.   Therapy Plans: HEP Disposition: Home with husband and mother in law Planned DVT Prophylaxis: Xarelto 77m daily DME needed: 3-n-1 PCP: Dr. KGabriel Carina(PCP)  TXA: IV Allergies: Tramadol - seizures, vancomycin - facial flushing Anesthesia Concerns: none Other: hx of breast CA, thrombophlebitis in the arm during chemotherapy treatment  - Patient was instructed on what medications to stop prior to surgery. - Follow-up visit in 2 weeks with Dr. AWynelle Link- Begin physical therapy following surgery - Pre-operative lab work as pre-surgical testing - Prescriptions will be provided in hospital at time of discharge  KTheresa Duty PA-C Orthopedic Surgery [facility] [place]

## 2018-06-07 ENCOUNTER — Other Ambulatory Visit: Payer: Self-pay

## 2018-06-07 ENCOUNTER — Encounter (HOSPITAL_COMMUNITY): Payer: Self-pay

## 2018-06-07 ENCOUNTER — Encounter (HOSPITAL_COMMUNITY)
Admission: RE | Admit: 2018-06-07 | Discharge: 2018-06-07 | Disposition: A | Discharge status: Home / Self Care | Payer: Medicare Other | Source: Ambulatory Visit | Attending: Orthopedic Surgery | Admitting: Orthopedic Surgery

## 2018-06-07 DIAGNOSIS — Z01812 Encounter for preprocedural laboratory examination: Secondary | ICD-10-CM | POA: Diagnosis present

## 2018-06-07 HISTORY — DX: Unspecified osteoarthritis, unspecified site: M19.90

## 2018-06-07 LAB — CBC
HCT: 46.2 % — ABNORMAL HIGH (ref 36.0–46.0)
Hemoglobin: 15.8 g/dL — ABNORMAL HIGH (ref 12.0–15.0)
MCH: 31.9 pg (ref 26.0–34.0)
MCHC: 34.2 g/dL (ref 30.0–36.0)
MCV: 93.3 fL (ref 80.0–100.0)
Platelets: 235 10*3/uL (ref 150–400)
RBC: 4.95 MIL/uL (ref 3.87–5.11)
RDW: 11.6 % (ref 11.5–15.5)
WBC: 9.6 10*3/uL (ref 4.0–10.5)
nRBC: 0 % (ref 0.0–0.2)

## 2018-06-07 LAB — SURGICAL PCR SCREEN
MRSA, PCR: NEGATIVE
STAPHYLOCOCCUS AUREUS: NEGATIVE

## 2018-06-07 LAB — COMPREHENSIVE METABOLIC PANEL
ALT: 16 U/L (ref 0–44)
AST: 23 U/L (ref 15–41)
Albumin: 4.7 g/dL (ref 3.5–5.0)
Alkaline Phosphatase: 61 U/L (ref 38–126)
Anion gap: 11 (ref 5–15)
BUN: 15 mg/dL (ref 6–20)
CO2: 20 mmol/L — ABNORMAL LOW (ref 22–32)
CREATININE: 0.75 mg/dL (ref 0.44–1.00)
Calcium: 9.2 mg/dL (ref 8.9–10.3)
Chloride: 107 mmol/L (ref 98–111)
GFR calc Af Amer: >60 mL/min (ref >60)
GFR calc non Af Amer: >60 mL/min (ref >60)
Glucose, Bld: 90 mg/dL (ref 70–99)
Potassium: 3.4 mmol/L — ABNORMAL LOW (ref 3.5–5.1)
Sodium: 138 mmol/L (ref 135–145)
Total Bilirubin: 0.7 mg/dL (ref 0.3–1.2)
Total Protein: 7.1 g/dL (ref 6.5–8.1)

## 2018-06-07 LAB — PROTIME-INR
INR: 1.03
Prothrombin Time: 13.4 seconds (ref 11.4–15.2)

## 2018-06-07 LAB — APTT: aPTT: 27 seconds (ref 24–36)

## 2018-06-07 LAB — HCG, SERUM, QUALITATIVE: Preg, Serum: NEGATIVE

## 2018-06-13 ENCOUNTER — Encounter (HOSPITAL_COMMUNITY)
Admission: RE | Disposition: A | Discharge status: Home / Self Care | Payer: Self-pay | Source: Home / Self Care | Attending: Orthopedic Surgery

## 2018-06-13 ENCOUNTER — Inpatient Hospital Stay (HOSPITAL_COMMUNITY): Payer: Medicare Other

## 2018-06-13 ENCOUNTER — Observation Stay (HOSPITAL_COMMUNITY)
Admission: RE | Admit: 2018-06-13 | Discharge: 2018-06-14 | Disposition: A | Discharge status: Home / Self Care | Payer: Medicare Other | Attending: Orthopedic Surgery | Admitting: Orthopedic Surgery

## 2018-06-13 ENCOUNTER — Encounter (HOSPITAL_COMMUNITY): Payer: Self-pay | Admitting: Anesthesiology

## 2018-06-13 ENCOUNTER — Inpatient Hospital Stay (HOSPITAL_COMMUNITY): Payer: Medicare Other | Admitting: Physician Assistant

## 2018-06-13 ENCOUNTER — Inpatient Hospital Stay (HOSPITAL_COMMUNITY): Payer: Medicare Other | Admitting: Anesthesiology

## 2018-06-13 ENCOUNTER — Telehealth (HOSPITAL_COMMUNITY): Payer: Self-pay | Admitting: *Deleted

## 2018-06-13 ENCOUNTER — Other Ambulatory Visit: Payer: Self-pay

## 2018-06-13 DIAGNOSIS — K219 Gastro-esophageal reflux disease without esophagitis: Secondary | ICD-10-CM | POA: Diagnosis not present

## 2018-06-13 DIAGNOSIS — Z8614 Personal history of Methicillin resistant Staphylococcus aureus infection: Secondary | ICD-10-CM | POA: Insufficient documentation

## 2018-06-13 DIAGNOSIS — E46 Unspecified protein-calorie malnutrition: Secondary | ICD-10-CM | POA: Insufficient documentation

## 2018-06-13 DIAGNOSIS — Z853 Personal history of malignant neoplasm of breast: Secondary | ICD-10-CM | POA: Insufficient documentation

## 2018-06-13 DIAGNOSIS — Z885 Allergy status to narcotic agent status: Secondary | ICD-10-CM | POA: Diagnosis not present

## 2018-06-13 DIAGNOSIS — F17210 Nicotine dependence, cigarettes, uncomplicated: Secondary | ICD-10-CM | POA: Diagnosis not present

## 2018-06-13 DIAGNOSIS — Z9221 Personal history of antineoplastic chemotherapy: Secondary | ICD-10-CM | POA: Insufficient documentation

## 2018-06-13 DIAGNOSIS — Z681 Body mass index (BMI) 19 or less, adult: Secondary | ICD-10-CM | POA: Insufficient documentation

## 2018-06-13 DIAGNOSIS — Z79899 Other long term (current) drug therapy: Secondary | ICD-10-CM | POA: Diagnosis not present

## 2018-06-13 DIAGNOSIS — M25559 Pain in unspecified hip: Secondary | ICD-10-CM

## 2018-06-13 DIAGNOSIS — M879 Osteonecrosis, unspecified: Secondary | ICD-10-CM | POA: Diagnosis present

## 2018-06-13 DIAGNOSIS — Z791 Long term (current) use of non-steroidal anti-inflammatories (NSAID): Secondary | ICD-10-CM | POA: Diagnosis not present

## 2018-06-13 DIAGNOSIS — F419 Anxiety disorder, unspecified: Secondary | ICD-10-CM | POA: Insufficient documentation

## 2018-06-13 DIAGNOSIS — Z888 Allergy status to other drugs, medicaments and biological substances status: Secondary | ICD-10-CM | POA: Diagnosis not present

## 2018-06-13 DIAGNOSIS — G40909 Epilepsy, unspecified, not intractable, without status epilepticus: Secondary | ICD-10-CM | POA: Insufficient documentation

## 2018-06-13 DIAGNOSIS — M1612 Unilateral primary osteoarthritis, left hip: Secondary | ICD-10-CM | POA: Diagnosis not present

## 2018-06-13 DIAGNOSIS — Z881 Allergy status to other antibiotic agents status: Secondary | ICD-10-CM | POA: Insufficient documentation

## 2018-06-13 DIAGNOSIS — Z923 Personal history of irradiation: Secondary | ICD-10-CM | POA: Diagnosis not present

## 2018-06-13 DIAGNOSIS — Z96649 Presence of unspecified artificial hip joint: Secondary | ICD-10-CM

## 2018-06-13 HISTORY — PX: TOTAL HIP ARTHROPLASTY: SHX124

## 2018-06-13 LAB — TYPE AND SCREEN
ABO/RH(D): O POS
Antibody Screen: NEGATIVE

## 2018-06-13 SURGERY — ARTHROPLASTY, HIP, TOTAL, ANTERIOR APPROACH
Anesthesia: Spinal | Site: Hip | Laterality: Left

## 2018-06-13 MED ORDER — ONDANSETRON HCL 4 MG/2ML IJ SOLN
INTRAMUSCULAR | Status: DC | PRN
  Administered 2018-06-13: 4 mg via INTRAVENOUS

## 2018-06-13 MED ORDER — PROPOFOL 10 MG/ML IV BOLUS
INTRAVENOUS | Status: AC
  Filled 2018-06-13: qty 20

## 2018-06-13 MED ORDER — MIDAZOLAM HCL 2 MG/2ML IJ SOLN: 2.0000 mg | Freq: Once | INTRAMUSCULAR | Status: DC

## 2018-06-13 MED ORDER — LAMOTRIGINE 100 MG PO TABS
300.0000 mg | ORAL_TABLET | Freq: Every day | ORAL | Status: DC
  Administered 2018-06-13: 300 mg via ORAL
  Filled 2018-06-13: qty 3

## 2018-06-13 MED ORDER — TRANEXAMIC ACID-NACL 1000-0.7 MG/100ML-% IV SOLN
1000.0000 mg | INTRAVENOUS | Status: AC
  Administered 2018-06-13: 1000 mg via INTRAVENOUS
  Filled 2018-06-13: qty 100

## 2018-06-13 MED ORDER — ONDANSETRON HCL 4 MG PO TABS: 4.0000 mg | ORAL_TABLET | Freq: Four times a day (QID) | ORAL | Status: DC | PRN

## 2018-06-13 MED ORDER — FENTANYL CITRATE (PF) 100 MCG/2ML IJ SOLN
INTRAMUSCULAR | Status: DC | PRN
  Administered 2018-06-13: 100 ug via INTRAVENOUS

## 2018-06-13 MED ORDER — NICOTINE 21 MG/24HR TD PT24
21.0000 mg | MEDICATED_PATCH | Freq: Every day | TRANSDERMAL | Status: DC
  Administered 2018-06-13 – 2018-06-14 (×2): 21 mg via TRANSDERMAL
  Filled 2018-06-13 (×2): qty 1

## 2018-06-13 MED ORDER — PHENYLEPHRINE 40 MCG/ML (10ML) SYRINGE FOR IV PUSH (FOR BLOOD PRESSURE SUPPORT)
PREFILLED_SYRINGE | INTRAVENOUS | Status: AC
  Filled 2018-06-13: qty 10

## 2018-06-13 MED ORDER — MORPHINE SULFATE (PF) 2 MG/ML IV SOLN
1.0000 mg | INTRAVENOUS | Status: DC | PRN
  Administered 2018-06-13 – 2018-06-14 (×4): 2 mg via INTRAVENOUS
  Filled 2018-06-13 (×5): qty 1

## 2018-06-13 MED ORDER — PANTOPRAZOLE SODIUM 40 MG PO TBEC
40.0000 mg | DELAYED_RELEASE_TABLET | Freq: Every day | ORAL | Status: DC
  Administered 2018-06-14: 40 mg via ORAL
  Filled 2018-06-13: qty 1

## 2018-06-13 MED ORDER — METOCLOPRAMIDE HCL 5 MG PO TABS: 5.0000 mg | ORAL_TABLET | Freq: Three times a day (TID) | ORAL | Status: DC | PRN

## 2018-06-13 MED ORDER — ONDANSETRON HCL 4 MG/2ML IJ SOLN: 4.0000 mg | Freq: Once | INTRAMUSCULAR | Status: DC | PRN

## 2018-06-13 MED ORDER — SODIUM CHLORIDE 0.9 % IV SOLN
INTRAVENOUS | Status: DC
  Administered 2018-06-13: 18:00:00 via INTRAVENOUS

## 2018-06-13 MED ORDER — BUPIVACAINE HCL (PF) 0.25 % IJ SOLN
INTRAMUSCULAR | Status: DC | PRN
  Administered 2018-06-13: 30 mL

## 2018-06-13 MED ORDER — ALBUTEROL SULFATE (2.5 MG/3ML) 0.083% IN NEBU: 2.5000 mg | INHALATION_SOLUTION | RESPIRATORY_TRACT | Status: DC | PRN

## 2018-06-13 MED ORDER — ALBUTEROL SULFATE HFA 108 (90 BASE) MCG/ACT IN AERS: 2.0000 | INHALATION_SPRAY | RESPIRATORY_TRACT | Status: DC | PRN

## 2018-06-13 MED ORDER — ACETAMINOPHEN 10 MG/ML IV SOLN
1000.0000 mg | Freq: Four times a day (QID) | INTRAVENOUS | Status: DC
  Administered 2018-06-13: 1000 mg via INTRAVENOUS
  Filled 2018-06-13: qty 100

## 2018-06-13 MED ORDER — EPHEDRINE SULFATE-NACL 50-0.9 MG/10ML-% IV SOSY
PREFILLED_SYRINGE | INTRAVENOUS | Status: DC | PRN
  Administered 2018-06-13: 5 mg via INTRAVENOUS

## 2018-06-13 MED ORDER — DOCUSATE SODIUM 100 MG PO CAPS
100.0000 mg | ORAL_CAPSULE | Freq: Two times a day (BID) | ORAL | Status: DC
  Administered 2018-06-13 – 2018-06-14 (×2): 100 mg via ORAL
  Filled 2018-06-13 (×2): qty 1

## 2018-06-13 MED ORDER — ESOMEPRAZOLE MAGNESIUM 20 MG PO PACK: 20.0000 mg | PACK | Freq: Every day | ORAL | Status: DC

## 2018-06-13 MED ORDER — OXYCODONE HCL 5 MG PO TABS
10.0000 mg | ORAL_TABLET | ORAL | Status: DC | PRN
  Administered 2018-06-13: 10 mg via ORAL
  Administered 2018-06-14 (×3): 15 mg via ORAL
  Filled 2018-06-13 (×3): qty 3

## 2018-06-13 MED ORDER — PHENOL 1.4 % MT LIQD
1.0000 | OROMUCOSAL | Status: DC | PRN
  Filled 2018-06-13: qty 177

## 2018-06-13 MED ORDER — POLYETHYLENE GLYCOL 3350 17 G PO PACK: 17.0000 g | PACK | Freq: Every day | ORAL | Status: DC | PRN

## 2018-06-13 MED ORDER — EPHEDRINE 5 MG/ML INJ
INTRAVENOUS | Status: AC
  Filled 2018-06-13: qty 10

## 2018-06-13 MED ORDER — OXYCODONE HCL 5 MG PO TABS
5.0000 mg | ORAL_TABLET | ORAL | Status: DC | PRN
  Administered 2018-06-13: 5 mg via ORAL
  Filled 2018-06-13: qty 1
  Filled 2018-06-13: qty 2

## 2018-06-13 MED ORDER — FLEET ENEMA 7-19 GM/118ML RE ENEM: 1.0000 | ENEMA | Freq: Once | RECTAL | Status: DC | PRN

## 2018-06-13 MED ORDER — MIDAZOLAM HCL 2 MG/2ML IJ SOLN
INTRAMUSCULAR | Status: AC
  Filled 2018-06-13: qty 2

## 2018-06-13 MED ORDER — METHOCARBAMOL 500 MG IVPB - SIMPLE MED
INTRAVENOUS | Status: AC
  Filled 2018-06-13: qty 50

## 2018-06-13 MED ORDER — BUPIVACAINE HCL (PF) 0.25 % IJ SOLN
INTRAMUSCULAR | Status: AC
  Filled 2018-06-13: qty 30

## 2018-06-13 MED ORDER — DEXAMETHASONE SODIUM PHOSPHATE 10 MG/ML IJ SOLN
8.0000 mg | Freq: Once | INTRAMUSCULAR | Status: AC
  Administered 2018-06-13: 8 mg via INTRAVENOUS

## 2018-06-13 MED ORDER — LACTATED RINGERS IV SOLN
INTRAVENOUS | Status: DC
  Administered 2018-06-13 (×2): via INTRAVENOUS

## 2018-06-13 MED ORDER — GABAPENTIN 400 MG PO CAPS
800.0000 mg | ORAL_CAPSULE | Freq: Three times a day (TID) | ORAL | Status: DC
  Administered 2018-06-13 – 2018-06-14 (×2): 800 mg via ORAL
  Filled 2018-06-13 (×2): qty 2

## 2018-06-13 MED ORDER — PROPOFOL 500 MG/50ML IV EMUL
INTRAVENOUS | Status: DC | PRN
  Administered 2018-06-13: 100 ug/kg/min via INTRAVENOUS

## 2018-06-13 MED ORDER — ALPRAZOLAM 1 MG PO TABS
1.0000 mg | ORAL_TABLET | Freq: Three times a day (TID) | ORAL | Status: DC
  Administered 2018-06-13 – 2018-06-14 (×3): 1 mg via ORAL
  Filled 2018-06-13 (×3): qty 1

## 2018-06-13 MED ORDER — DIPHENHYDRAMINE HCL 12.5 MG/5ML PO ELIX: 12.5000 mg | ORAL_SOLUTION | ORAL | Status: DC | PRN

## 2018-06-13 MED ORDER — METHOCARBAMOL 500 MG PO TABS
500.0000 mg | ORAL_TABLET | Freq: Four times a day (QID) | ORAL | Status: DC | PRN
  Administered 2018-06-13 – 2018-06-14 (×2): 500 mg via ORAL
  Filled 2018-06-13 (×2): qty 1

## 2018-06-13 MED ORDER — SODIUM CHLORIDE 0.9 % IR SOLN
Status: DC | PRN
  Administered 2018-06-13: 1000 mL

## 2018-06-13 MED ORDER — FENTANYL CITRATE (PF) 100 MCG/2ML IJ SOLN
INTRAMUSCULAR | Status: AC
  Filled 2018-06-13: qty 2

## 2018-06-13 MED ORDER — PROPOFOL 10 MG/ML IV BOLUS
INTRAVENOUS | Status: DC | PRN
  Administered 2018-06-13 (×4): 10 mg via INTRAVENOUS

## 2018-06-13 MED ORDER — FENTANYL CITRATE (PF) 100 MCG/2ML IJ SOLN: 25.0000 ug | INTRAMUSCULAR | Status: DC | PRN

## 2018-06-13 MED ORDER — OXYCODONE HCL 5 MG/5ML PO SOLN
5.0000 mg | Freq: Once | ORAL | Status: DC | PRN
  Filled 2018-06-13: qty 5

## 2018-06-13 MED ORDER — METOCLOPRAMIDE HCL 5 MG/ML IJ SOLN: 5.0000 mg | Freq: Three times a day (TID) | INTRAMUSCULAR | Status: DC | PRN

## 2018-06-13 MED ORDER — METHOCARBAMOL 500 MG IVPB - SIMPLE MED
500.0000 mg | Freq: Four times a day (QID) | INTRAVENOUS | Status: DC | PRN
  Administered 2018-06-13: 500 mg via INTRAVENOUS
  Filled 2018-06-13: qty 50

## 2018-06-13 MED ORDER — DEXAMETHASONE SODIUM PHOSPHATE 10 MG/ML IJ SOLN
10.0000 mg | Freq: Once | INTRAMUSCULAR | Status: DC
  Filled 2018-06-13: qty 1

## 2018-06-13 MED ORDER — OXYCODONE HCL 5 MG PO TABS: 5.0000 mg | ORAL_TABLET | Freq: Once | ORAL | Status: DC | PRN

## 2018-06-13 MED ORDER — MIDAZOLAM HCL 2 MG/2ML IJ SOLN
INTRAMUSCULAR | Status: AC
  Administered 2018-06-13: 2 mg
  Filled 2018-06-13: qty 2

## 2018-06-13 MED ORDER — BISACODYL 10 MG RE SUPP: 10.0000 mg | Freq: Every day | RECTAL | Status: DC | PRN

## 2018-06-13 MED ORDER — ONDANSETRON HCL 4 MG/2ML IJ SOLN: 4.0000 mg | Freq: Four times a day (QID) | INTRAMUSCULAR | Status: DC | PRN

## 2018-06-13 MED ORDER — ACETAMINOPHEN 500 MG PO TABS
1000.0000 mg | ORAL_TABLET | Freq: Four times a day (QID) | ORAL | Status: AC
  Administered 2018-06-13 – 2018-06-14 (×4): 1000 mg via ORAL
  Filled 2018-06-13 (×4): qty 2

## 2018-06-13 MED ORDER — RIVAROXABAN 10 MG PO TABS
10.0000 mg | ORAL_TABLET | Freq: Every day | ORAL | Status: DC
  Administered 2018-06-14: 10 mg via ORAL
  Filled 2018-06-13: qty 1

## 2018-06-13 MED ORDER — MIDAZOLAM HCL 5 MG/5ML IJ SOLN
INTRAMUSCULAR | Status: DC | PRN
  Administered 2018-06-13: 2 mg via INTRAVENOUS

## 2018-06-13 MED ORDER — BUPIVACAINE IN DEXTROSE 0.75-8.25 % IT SOLN
INTRATHECAL | Status: DC | PRN
  Administered 2018-06-13: 1.6 mL via INTRATHECAL

## 2018-06-13 MED ORDER — PHENYLEPHRINE 40 MCG/ML (10ML) SYRINGE FOR IV PUSH (FOR BLOOD PRESSURE SUPPORT)
PREFILLED_SYRINGE | INTRAVENOUS | Status: DC | PRN
  Administered 2018-06-13 (×2): 40 ug via INTRAVENOUS
  Administered 2018-06-13 (×2): 80 ug via INTRAVENOUS

## 2018-06-13 MED ORDER — CEFAZOLIN SODIUM-DEXTROSE 2-4 GM/100ML-% IV SOLN
2.0000 g | INTRAVENOUS | Status: AC
  Administered 2018-06-13: 2 g via INTRAVENOUS
  Filled 2018-06-13: qty 100

## 2018-06-13 MED ORDER — CHLORHEXIDINE GLUCONATE 4 % EX LIQD
60.0000 mL | Freq: Once | CUTANEOUS | Status: AC
  Administered 2018-06-13: 4 via TOPICAL

## 2018-06-13 MED ORDER — MENTHOL 3 MG MT LOZG: 1.0000 | LOZENGE | OROMUCOSAL | Status: DC | PRN

## 2018-06-13 MED ORDER — PROPOFOL 10 MG/ML IV BOLUS
INTRAVENOUS | Status: AC
  Filled 2018-06-13: qty 60

## 2018-06-13 MED ORDER — CEFAZOLIN SODIUM-DEXTROSE 1-4 GM/50ML-% IV SOLN
1.0000 g | Freq: Four times a day (QID) | INTRAVENOUS | Status: AC
  Administered 2018-06-13 – 2018-06-14 (×2): 1 g via INTRAVENOUS
  Filled 2018-06-13 (×2): qty 50

## 2018-06-13 SURGICAL SUPPLY — 42 items
BAG DECANTER FOR FLEXI CONT (MISCELLANEOUS) ×3 IMPLANT
BAG ZIPLOCK 12X15 (MISCELLANEOUS) IMPLANT
BLADE SAG 18X100X1.27 (BLADE) ×3 IMPLANT
BLADE SURG SZ10 CARB STEEL (BLADE) ×6 IMPLANT
CLOSURE WOUND 1/2 X4 (GAUZE/BANDAGES/DRESSINGS) ×2
COVER PERINEAL POST (MISCELLANEOUS) ×3 IMPLANT
COVER SURGICAL LIGHT HANDLE (MISCELLANEOUS) ×3 IMPLANT
COVER WAND RF STERILE (DRAPES) IMPLANT
CUP ACETBLR 48 OD SECTOR II (Hips) ×3 IMPLANT
DECANTER SPIKE VIAL GLASS SM (MISCELLANEOUS) ×3 IMPLANT
DRAPE STERI IOBAN 125X83 (DRAPES) ×3 IMPLANT
DRAPE U-SHAPE 47X51 STRL (DRAPES) ×6 IMPLANT
DRSG ADAPTIC 3X8 NADH LF (GAUZE/BANDAGES/DRESSINGS) ×3 IMPLANT
DRSG MEPILEX BORDER 4X4 (GAUZE/BANDAGES/DRESSINGS) ×3 IMPLANT
DRSG MEPILEX BORDER 4X8 (GAUZE/BANDAGES/DRESSINGS) ×3 IMPLANT
DURAPREP 26ML APPLICATOR (WOUND CARE) ×3 IMPLANT
ELECT REM PT RETURN 15FT ADLT (MISCELLANEOUS) ×3 IMPLANT
EVACUATOR 1/8 PVC DRAIN (DRAIN) ×3 IMPLANT
GLOVE BIO SURGEON STRL SZ7 (GLOVE) ×3 IMPLANT
GLOVE BIO SURGEON STRL SZ8 (GLOVE) ×3 IMPLANT
GLOVE BIOGEL PI IND STRL 7.0 (GLOVE) ×1 IMPLANT
GLOVE BIOGEL PI IND STRL 8 (GLOVE) ×1 IMPLANT
GLOVE BIOGEL PI INDICATOR 7.0 (GLOVE) ×2
GLOVE BIOGEL PI INDICATOR 8 (GLOVE) ×2
GOWN STRL REUS W/TWL LRG LVL3 (GOWN DISPOSABLE) ×6 IMPLANT
HEAD CERAMIC DELTA 28 P1.5 HIP (Head) ×3 IMPLANT
HOLDER FOLEY CATH W/STRAP (MISCELLANEOUS) ×3 IMPLANT
LINER MARATHON 28 48 (Hips) ×3 IMPLANT
MANIFOLD NEPTUNE II (INSTRUMENTS) ×3 IMPLANT
PACK ANTERIOR HIP CUSTOM (KITS) ×3 IMPLANT
STEM FEMORAL SZ 6MM STD ACTIS (Stem) ×3 IMPLANT
STRIP CLOSURE SKIN 1/2X4 (GAUZE/BANDAGES/DRESSINGS) ×4 IMPLANT
SUT ETHIBOND NAB CT1 #1 30IN (SUTURE) ×3 IMPLANT
SUT MNCRL AB 4-0 PS2 18 (SUTURE) ×3 IMPLANT
SUT STRATAFIX 0 PDS 27 VIOLET (SUTURE) ×3
SUT VIC AB 2-0 CT1 27 (SUTURE) ×4
SUT VIC AB 2-0 CT1 TAPERPNT 27 (SUTURE) ×2 IMPLANT
SUTURE STRATFX 0 PDS 27 VIOLET (SUTURE) ×1 IMPLANT
SYR 50ML LL SCALE MARK (SYRINGE) IMPLANT
TRAY FOLEY BAG SILVER LF 14FR (CATHETERS) ×3 IMPLANT
TRAY FOLEY MTR SLVR 16FR STAT (SET/KITS/TRAYS/PACK) IMPLANT
YANKAUER SUCT BULB TIP 10FT TU (MISCELLANEOUS) ×3 IMPLANT

## 2018-06-13 NOTE — Anesthesia Procedure Notes (Signed)
Spinal  Patient location during procedure: OR Start time: 06/13/2018 2:19 PM End time: 06/13/2018 2:22 PM Staffing Anesthesiologist: Audry Pili, MD Performed: anesthesiologist  Preanesthetic Checklist Completed: patient identified, surgical consent, pre-op evaluation, timeout performed, IV checked, risks and benefits discussed and monitors and equipment checked Spinal Block Patient position: sitting Prep: DuraPrep Patient monitoring: heart rate, cardiac monitor, continuous pulse ox and blood pressure Approach: midline Location: L3-4 Injection technique: single-shot Needle Needle type: Pencan  Needle gauge: 24 G Additional Notes Consent was obtained prior to the procedure with all questions answered and concerns addressed. Risks including, but not limited to, bleeding, infection, nerve damage, paralysis, failed block, inadequate analgesia, allergic reaction, high spinal, itching, and headache were discussed and the patient wished to proceed. Functioning IV was confirmed and monitors were applied. Sterile prep and drape, including hand hygiene, mask, and sterile gloves were used. The patient was positioned and the spine was prepped. The skin was anesthetized with lidocaine. Free flow of clear CSF was obtained prior to injecting local anesthetic into the CSF. The spinal needle aspirated freely following injection. The needle was carefully withdrawn. The patient tolerated the procedure well.   Crystal Don, MD

## 2018-06-13 NOTE — Anesthesia Preprocedure Evaluation (Addendum)
Anesthesia Evaluation  Patient identified by MRN, date of birth, ID band Patient awake    Reviewed: Allergy & Precautions, NPO status , Patient's Chart, lab work & pertinent test results  History of Anesthesia Complications Negative for: history of anesthetic complications  Airway Mallampati: II  TM Distance: >3 FB Neck ROM: Full    Dental  (+) Dental Advisory Given   Pulmonary Current Smoker,    breath sounds clear to auscultation       Cardiovascular + DVT   Rhythm:Regular Rate:Normal     Neuro/Psych  Headaches, Seizures -, Well Controlled,  PSYCHIATRIC DISORDERS Anxiety Depression Bipolar Disorder  Hx anorexia nervosa  Neuropathic pain     GI/Hepatic Neg liver ROS, GERD  Medicated and Controlled,  Endo/Other  negative endocrine ROS  Renal/GU negative Renal ROS     Musculoskeletal  (+) Arthritis ,   Abdominal   Peds  Hematology negative hematology ROS (+)   Anesthesia Other Findings   Reproductive/Obstetrics  Breast cancer                             Anesthesia Physical Anesthesia Plan  ASA: II  Anesthesia Plan: Spinal   Post-op Pain Management:    Induction:   PONV Risk Score and Plan: 2 and Treatment may vary due to age or medical condition and Propofol infusion  Airway Management Planned: Natural Airway and Simple Face Mask  Additional Equipment: None  Intra-op Plan:   Post-operative Plan:   Informed Consent: I have reviewed the patients History and Physical, chart, labs and discussed the procedure including the risks, benefits and alternatives for the proposed anesthesia with the patient or authorized representative who has indicated his/her understanding and acceptance.       Plan Discussed with: CRNA and Anesthesiologist  Anesthesia Plan Comments: (Labs reviewed, platelets acceptable. Discussed risks and benefits of spinal, including spinal/epidural  hematoma, infection, failed block, and PDPH. Patient expressed understanding and wished to proceed. )       Anesthesia Quick Evaluation

## 2018-06-13 NOTE — Progress Notes (Signed)
Pt requesting a nicotine patch. Called on-call provider for [facility]. Received call back from [facility] with new orders for nicotine patch once daily.

## 2018-06-13 NOTE — Transfer of Care (Signed)
Immediate Anesthesia Transfer of Care Note  Patient: Crystal [name]  Procedure(s) Performed: TOTAL HIP ARTHROPLASTY ANTERIOR APPROACH (Left Hip)  Patient Location: PACU  Anesthesia Type:MAC and Spinal  Level of Consciousness: awake and patient cooperative  Airway & Oxygen Therapy: Patient Spontanous Breathing and Patient connected to face mask oxygen  Post-op Assessment: Report given to RN and Post -op Vital signs reviewed and stable  Post vital signs: Reviewed and stable  Last Vitals:  Vitals Value Taken Time  BP 87/55 06/13/2018  4:06 PM  Temp    Pulse 67 06/13/2018  4:09 PM  Resp 7 06/13/2018  4:09 PM  SpO2 100 % 06/13/2018  4:09 PM  Vitals shown include unvalidated device data.  Last Pain:  Vitals:   06/13/18 1310  TempSrc: Oral  PainSc:          Complications: No apparent anesthesia complications

## 2018-06-13 NOTE — Anesthesia Procedure Notes (Signed)
Procedure Name: MAC Date/Time: 06/13/2018 2:16 PM Performed by: West Pugh, CRNA Pre-anesthesia Checklist: Patient identified, Emergency Drugs available, Suction available, Patient being monitored and Timeout performed Patient Re-evaluated:Patient Re-evaluated prior to induction Oxygen Delivery Method: Simple face mask Preoxygenation: Pre-oxygenation with 100% oxygen Induction Type: IV induction Placement Confirmation: positive ETCO2 Dental Injury: Teeth and Oropharynx as per pre-operative assessment

## 2018-06-13 NOTE — Progress Notes (Signed)
Received report from Mia Creek, RN

## 2018-06-13 NOTE — Interval H&P Note (Signed)
History and Physical Interval Note:  06/13/2018 1:13 PM  Crystal [name]  has presented today for surgery, with the diagnosis of left hip avascular necrosis  The various methods of treatment have been discussed with the patient and family. After consideration of risks, benefits and other options for treatment, the patient has consented to  Procedure(s) with comments: [place] (Left) - 165min as a surgical intervention .  The patient's history has been reviewed, patient examined, no change in status, stable for surgery.  I have reviewed the patient's chart and labs.  Questions were answered to the patient's satisfaction.     Pilar Plate Renne Platts

## 2018-06-13 NOTE — Op Note (Signed)
OPERATIVE REPORT- TOTAL HIP ARTHROPLASTY   PREOPERATIVE DIAGNOSIS: Osteonecrosis of the Left hip.   POSTOPERATIVE DIAGNOSIS: Osteonecrosis of the Left  hip.   PROCEDURE: Left total hip arthroplasty, anterior approach.   SURGEON: Gaynelle Arabian, MD   ASSISTANT: Theresa Duty, PA-C  ANESTHESIA:  Spinal  ESTIMATED BLOOD LOSS:-200 mL    DRAINS: Hemovac x1.   COMPLICATIONS: None   CONDITION: PACU - hemodynamically stable.   BRIEF CLINICAL NOTE: Crystal [name] is a 43 y.o. female who has advanced end-  stage osteonecrosis of their Left  hip with progressively worsening pain and  dysfunction.The patient has failed nonoperative management and presents for  total hip arthroplasty.   PROCEDURE IN DETAIL: After successful administration of spinal  anesthetic, the traction boots for the [facility] bed were placed on both  feet and the patient was placed onto the [facility] bed, boots placed into the leg  holders. The Left hip was then isolated from the perineum with plastic  drapes and prepped and draped in the usual sterile fashion. ASIS and  greater trochanter were marked and a oblique incision was made, starting  at about 1 cm lateral and 2 cm distal to the ASIS and coursing towards  the anterior cortex of the femur. The skin was cut with a 10 blade  through subcutaneous tissue to the level of the fascia overlying the  tensor fascia lata muscle. The fascia was then incised in line with the  incision at the junction of the anterior third and posterior 2/3rd. The  muscle was teased off the fascia and then the interval between the TFL  and the rectus was developed. The Hohmann retractor was then placed at  the top of the femoral neck over the capsule. The vessels overlying the  capsule were cauterized and the fat on top of the capsule was removed.  A Hohmann retractor was then placed anterior underneath the rectus  femoris to give exposure to the entire anterior capsule. A T-shaped   capsulotomy was performed. The edges were tagged and the femoral head  was identified.       Osteophytes are removed off the superior acetabulum.  The femoral neck was then cut in situ with an oscillating saw. Traction  was then applied to the left lower extremity utilizing the [facility]  traction. The femoral head was then removed. Retractors were placed  around the acetabulum and then circumferential removal of the labrum was  performed. Osteophytes were also removed. Reaming starts at 45 mm to  medialize and  Increased in 2 mm increments to 47 mm. We reamed in  approximately 40 degrees of abduction, 20 degrees anteversion. A 48 mm  pinnacle acetabular shell was then impacted in anatomic position under  fluoroscopic guidance with excellent purchase. We did not need to place  any additional dome screws. A 28 mm neutral + 4 marathon liner was then  placed into the acetabular shell.       The femoral lift was then placed along the lateral aspect of the femur  just distal to the vastus ridge. The leg was  externally rotated and capsule  was stripped off the inferior aspect of the femoral neck down to the  level of the lesser trochanter, this was done with electrocautery. The femur was lifted after this was performed. The  leg was then placed in an extended and adducted position essentially delivering the femur. We also removed the capsule superiorly and the piriformis from the piriformis  fossa to gain excellent exposure of the  proximal femur. Rongeur was used to remove some cancellous bone to get  into the lateral portion of the proximal femur for placement of the  initial starter reamer. The starter broaches was placed  the starter broach  and was shown to go down the center of the canal. Broaching  with the Actis system was then performed starting at size 0  coursing  Up to size 6. A size 6 had excellent torsional and rotational  and axial stability. The trial standard offset neck was then  placed  with a 28 + 1.5 trial head. The hip was then reduced. We confirmed that  the stem was in the canal both on AP and lateral x-rays. It also has excellent sizing. The hip was reduced with outstanding stability through full extension and full external rotation.. AP pelvis was taken and the leg lengths were measured and found to be equal. Hip was then dislocated again and the femoral head and neck removed. The  femoral broach was removed. Size 6 Actis stem with a standard offset  neck was then impacted into the femur following native anteversion. Has  excellent purchase in the canal. Excellent torsional and rotational and  axial stability. It is confirmed to be in the canal on AP and lateral  fluoroscopic views. The 28 + 1.5 ceramic head was placed and the hip  reduced with outstanding stability. Again AP pelvis was taken and it  confirmed that the leg lengths were equal. The wound was then copiously  irrigated with saline solution and the capsule reattached and repaired  with Ethibond suture. 30 ml of .25% Bupivicaine was  injected into the capsule and into the edge of the tensor fascia lata as well as subcutaneous tissue. The fascia overlying the tensor fascia lata was then closed with a running #1 V-Loc. Subcu was closed with interrupted 2-0 Vicryl and subcuticular running 4-0 Monocryl. Incision was cleaned  and dried. Steri-Strips and a bulky sterile dressing applied. Hemovac  drain was hooked to suction and then the patient was awakened and transported to  recovery in stable condition.        Please note that a surgical assistant was a medical necessity for this procedure to perform it in a safe and expeditious manner. Assistant was necessary to provide appropriate retraction of vital neurovascular structures and to prevent femoral fracture and allow for anatomic placement of the prosthesis.  Gaynelle Arabian, M.D.

## 2018-06-13 NOTE — Discharge Instructions (Signed)
°Dr. Frank Aluisio °Total Joint Specialist °[facility] °[address] °[place], [place] [address] °[phone] ° °ANTERIOR APPROACH TOTAL HIP REPLACEMENT POSTOPERATIVE DIRECTIONS ° ° °Hip Rehabilitation, Guidelines Following Surgery  °The results of a hip operation are greatly improved after range of motion and muscle strengthening exercises. Follow all safety measures which are given to protect your hip. If any of these exercises cause increased pain or swelling in your joint, decrease the amount until you are comfortable again. Then slowly increase the exercises. Call your caregiver if you have problems or questions.  ° °HOME CARE INSTRUCTIONS  °• Remove items at home which could result in a fall. This includes throw rugs or furniture in walking pathways.  °· ICE to the affected hip every three hours for 30 minutes at a time and then as needed for pain and swelling.  Continue to use ice on the hip for pain and swelling from surgery. You may notice swelling that will progress down to the foot and ankle.  This is normal after surgery.  Elevate the leg when you are not up walking on it.   °· Continue to use the breathing machine which will help keep your temperature down.  It is common for your temperature to cycle up and down following surgery, especially at night when you are not up moving around and exerting yourself.  The breathing machine keeps your lungs expanded and your temperature down. ° °DIET °You may resume your previous home diet once your are discharged from the hospital. ° °DRESSING / WOUND CARE / SHOWERING °You may shower 3 days after surgery, but keep the wounds dry during showering.  You may use an occlusive plastic wrap (Press'n Seal for example), NO SOAKING/SUBMERGING IN THE BATHTUB.  If the bandage gets wet, change with a clean dry gauze.  If the incision gets wet, pat the wound dry with a clean towel. °You may start showering once you are discharged home but do not submerge the  incision under water. Just pat the incision dry and apply a dry gauze dressing on daily. °Change the surgical dressing daily and reapply a dry dressing each time. ° °ACTIVITY °Walk with your walker as instructed. °Use walker as long as suggested by your caregivers. °Avoid periods of inactivity such as sitting longer than an hour when not asleep. This helps prevent blood clots.  °You may resume a sexual relationship in one month or when given the OK by your doctor.  °You may return to work once you are cleared by your doctor.  °Do not drive a car for 6 weeks or until released by you surgeon.  °Do not drive while taking narcotics. ° °WEIGHT BEARING °Weight bearing as tolerated with assist device (walker, cane, etc) as directed, use it as long as suggested by your surgeon or therapist, typically at least 4-6 weeks. ° °POSTOPERATIVE CONSTIPATION PROTOCOL °Constipation - defined medically as fewer than three stools per week and severe constipation as less than one stool per week. ° °One of the most common issues patients have following surgery is constipation.  Even if you have a regular bowel pattern at home, your normal regimen is likely to be disrupted due to multiple reasons following surgery.  Combination of anesthesia, postoperative narcotics, change in appetite and fluid intake all can affect your bowels.  In order to avoid complications following surgery, here are some recommendations in order to help you during your recovery period. ° °Colace (docusate) - Pick up an over-the-counter form   of Colace or another stool softener and take twice a day as long as you are requiring postoperative pain medications.  Take with a full glass of water daily.  If you experience loose stools or diarrhea, hold the colace until you stool forms back up.  If your symptoms do not get better within 1 week or if they get worse, check with your doctor. ° °Dulcolax (bisacodyl) - Pick up over-the-counter and take as directed by the product  packaging as needed to assist with the movement of your bowels.  Take with a full glass of water.  Use this product as needed if not relieved by Colace only.  ° °MiraLax (polyethylene glycol) - Pick up over-the-counter to have on hand.  MiraLax is a solution that will increase the amount of water in your bowels to assist with bowel movements.  Take as directed and can mix with a glass of water, juice, soda, coffee, or tea.  Take if you go more than two days without a movement. °Do not use MiraLax more than once per day. Call your doctor if you are still constipated or irregular after using this medication for 7 days in a row. ° °If you continue to have problems with postoperative constipation, please contact the office for further assistance and recommendations.  If you experience "the worst abdominal pain ever" or develop nausea or vomiting, please contact the office immediatly for further recommendations for treatment. ° °ITCHING ° If you experience itching with your medications, try taking only a single pain pill, or even half a pain pill at a time.  You can also use Benadryl over the counter for itching or also to help with sleep.  ° °TED HOSE STOCKINGS °Wear the elastic stockings on both legs for three weeks following surgery during the day but you may remove then at night for sleeping. ° °MEDICATIONS °See your medication summary on the “After Visit Summary” that the nursing staff will review with you prior to discharge.  You may have some home medications which will be placed on hold until you complete the course of blood thinner medication.  It is important for you to complete the blood thinner medication as prescribed by your surgeon.  Continue your approved medications as instructed at time of discharge. ° °PRECAUTIONS °If you experience chest pain or shortness of breath - call 911 immediately for transfer to the hospital emergency department.  °If you develop a fever greater that 101 F, purulent drainage  from wound, increased redness or drainage from wound, foul odor from the wound/dressing, or calf pain - CONTACT YOUR SURGEON.   °                                                °FOLLOW-UP APPOINTMENTS °Make sure you keep all of your appointments after your operation with your surgeon and caregivers. You should call the office at the above phone number and make an appointment for approximately two weeks after the date of your surgery or on the date instructed by your surgeon outlined in the "After Visit Summary". ° °RANGE OF MOTION AND STRENGTHENING EXERCISES  °These exercises are designed to help you keep full movement of your hip joint. Follow your caregiver's or physical therapist's instructions. Perform all exercises about fifteen times, three times per day or as directed. Exercise both hips, even if you have   had only one joint replacement. These exercises can be done on a training (exercise) mat, on the floor, on a table or on a bed. Use whatever works the best and is most comfortable for you. Use music or television while you are exercising so that the exercises are a pleasant break in your day. This will make your life better with the exercises acting as a break in routine you can look forward to.   Lying on your back, slowly slide your foot toward your buttocks, raising your knee up off the floor. Then slowly slide your foot back down until your leg is straight again.   Lying on your back spread your legs as far apart as you can without causing discomfort.   Lying on your side, raise your upper leg and foot straight up from the floor as far as is comfortable. Slowly lower the leg and repeat.   Lying on your back, tighten up the muscle in the front of your thigh (quadriceps muscles). You can do this by keeping your leg straight and trying to raise your heel off the floor. This helps strengthen the largest muscle supporting your knee.   Lying on your back, tighten up the muscles of your buttocks both  with the legs straight and with the knee bent at a comfortable angle while keeping your heel on the floor.   IF YOU ARE TRANSFERRED TO A SKILLED REHAB FACILITY If the patient is transferred to a skilled rehab facility following release from the hospital, a list of the current medications will be sent to the facility for the patient to continue.  When discharged from the skilled rehab facility, please have the facility set up the patient's [place] prior to being released. Also, the skilled facility will be responsible for providing the patient with their medications at time of release from the facility to include their pain medication, the muscle relaxants, and their blood thinner medication. If the patient is still at the rehab facility at time of the two week follow up appointment, the skilled rehab facility will also need to assist the patient in arranging follow up appointment in our office and any transportation needs.  MAKE SURE YOU:   Understand these instructions.   Get help right away if you are not doing well or get worse.    Pick up stool softner and laxative for home use following surgery while on pain medications. Do not submerge incision under water. Please use good hand washing techniques while changing dressing each day. May shower starting three days after surgery. Please use a clean towel to pat the incision dry following showers. Continue to use ice for pain and swelling after surgery. Do not use any lotions or creams on the incision until instructed by your surgeon.  __________________________________________  Information on my medicine - XARELTO (Rivaroxaban)  This medication education was reviewed with me or my healthcare representative as part of my discharge preparation.    Why was Xarelto prescribed for you? Xarelto was prescribed for you to reduce the risk of blood clots forming after orthopedic surgery. The medical term for these abnormal  blood clots is venous thromboembolism (VTE).  What do you need to know about xarelto ? Take your Xarelto ONCE DAILY at the same time every day. You may take it either with or without food.  If you have difficulty swallowing the tablet whole, you may crush it and mix in applesauce just prior to taking your dose.  Take  Xarelto exactly as prescribed by your doctor and DO NOT stop taking Xarelto without talking to the doctor who prescribed the medication.  Stopping without other VTE prevention medication to take the place of Xarelto may increase your risk of developing a clot.  After discharge, you should have regular check-up appointments with your healthcare provider that is prescribing your Xarelto.    What do you do if you miss a dose? If you miss a dose, take it as soon as you remember on the same day then continue your regularly scheduled once daily regimen the next day. Do not take two doses of Xarelto on the same day.   Important Safety Information A possible side effect of Xarelto is bleeding. You should call your healthcare provider right away if you experience any of the following: ? Bleeding from an injury or your nose that does not stop. ? Unusual colored urine (red or dark brown) or unusual colored stools (red or black). ? Unusual bruising for unknown reasons. ? A serious fall or if you hit your head (even if there is no bleeding).  Some medicines may interact with Xarelto and might increase your risk of bleeding while on Xarelto. To help avoid this, consult your healthcare provider or pharmacist prior to using any new prescription or non-prescription medications, including herbals, vitamins, non-steroidal anti-inflammatory drugs (NSAIDs) and supplements.  This website has more information on Xarelto: [email or web address]

## 2018-06-13 NOTE — Plan of Care (Signed)

## 2018-06-14 ENCOUNTER — Encounter (HOSPITAL_COMMUNITY): Payer: Self-pay | Admitting: Orthopedic Surgery

## 2018-06-14 DIAGNOSIS — M879 Osteonecrosis, unspecified: Secondary | ICD-10-CM | POA: Diagnosis not present

## 2018-06-14 LAB — CBC
HCT: 36.2 % (ref 36.0–46.0)
Hemoglobin: 12.1 g/dL (ref 12.0–15.0)
MCH: 32.4 pg (ref 26.0–34.0)
MCHC: 33.4 g/dL (ref 30.0–36.0)
MCV: 97.1 fL (ref 80.0–100.0)
Platelets: 151 10*3/uL (ref 150–400)
RBC: 3.73 MIL/uL — ABNORMAL LOW (ref 3.87–5.11)
RDW: 11.6 % (ref 11.5–15.5)
WBC: 10.9 10*3/uL — ABNORMAL HIGH (ref 4.0–10.5)
nRBC: 0 % (ref 0.0–0.2)

## 2018-06-14 LAB — BASIC METABOLIC PANEL
ANION GAP: 6 (ref 5–15)
BUN: 13 mg/dL (ref 6–20)
CALCIUM: 8.4 mg/dL — AB (ref 8.9–10.3)
CO2: 26 mmol/L (ref 22–32)
Chloride: 107 mmol/L (ref 98–111)
Creatinine, Ser: 0.82 mg/dL (ref 0.44–1.00)
GFR calc Af Amer: >60 mL/min (ref >60)
GFR calc non Af Amer: >60 mL/min (ref >60)
Glucose, Bld: 135 mg/dL — ABNORMAL HIGH (ref 70–99)
Potassium: 4.3 mmol/L (ref 3.5–5.1)
Sodium: 139 mmol/L (ref 135–145)

## 2018-06-14 MED ORDER — OXYCODONE HCL 5 MG PO TABS: 5.0000 mg | ORAL_TABLET | Freq: Four times a day (QID) | ORAL | 0 refills | Status: DC | PRN

## 2018-06-14 MED ORDER — RIVAROXABAN 10 MG PO TABS: 10.0000 mg | ORAL_TABLET | Freq: Every day | ORAL | 0 refills | Status: DC

## 2018-06-14 MED ORDER — METHOCARBAMOL 500 MG PO TABS: 500.0000 mg | ORAL_TABLET | Freq: Four times a day (QID) | ORAL | 0 refills | Status: DC | PRN

## 2018-06-14 NOTE — Progress Notes (Signed)
Subjective: 1 Day Post-Op Procedure(s) (LRB): TOTAL HIP ARTHROPLASTY ANTERIOR APPROACH (Left) Patient reports pain as mild.   Patient seen in rounds by Dr. Wynelle Link. Patient is well, and has had no acute complaints or problems other than discomfort in the left hip. No issues overnight. Denies chest pain or SOB. Foley catheter removed this AM. We will continue therapy today.   Objective: Vital signs in last 24 hours: Temp:  [97.4 F (36.3 C)-98.9 F (37.2 C)] 98.1 F (36.7 C) (02/13 0521) Pulse Rate:  [49-100] 90 (02/13 0521) Resp:  [8-25] 17 (02/13 0521) BP: (87-128)/(55-98) 114/84 (02/13 0521) SpO2:  [95 %-100 %] 100 % (02/13 0521) Weight:  [51.7 kg] 51.7 kg (02/12 1305)  Intake/Output from previous day:  Intake/Output Summary (Last 24 hours) at 06/14/2018 0757 Last data filed at 06/14/2018 5427 Gross per 24 hour  Intake 3710.97 ml  Output 3377 ml  Net 333.97 ml    Labs: Recent Labs    06/14/18 0521  HGB 12.1   Recent Labs    06/14/18 0521  WBC 10.9*  RBC 3.73*  HCT 36.2  PLT 151   Recent Labs    06/14/18 0521  NA 139  K 4.3  CL 107  CO2 26  BUN 13  CREATININE 0.82  GLUCOSE 135*  CALCIUM 8.4*   Exam: General - Patient is Alert and Oriented Extremity - Neurologically intact Neurovascular intact Sensation intact distally Dorsiflexion/Plantar flexion intact Dressing - dressing C/D/I Motor Function - intact, moving foot and toes well on exam.   Past Medical History:  Diagnosis Date  . Anxiety   . Bipolar affective disorder, mixed ([place])   . Cancer ([place])   . Depression   . DJD (degenerative joint disease)    "BONE STICKS OUT ON LEFT PART OF LOWER BACK"  . Family history of anesthesia complication    MOTHER PONV  . Frequency of urination   . GERD (gastroesophageal reflux disease)    OCCASIONALLY TAKE ZANTAC  . History of breast cancer DX JUNE 2012 W/ RIGHT BREAST INVASIVE DUCTAL CA  STAGE IIB---  S/P BILATERAL MASTECTOMIES AND RIGHT NODE  DISSECTION   ONCOLOGIST-- DR Jana Hakim--  CHEMO ENDED [place] 2012 / RADIATION ENDED MAR 2013--  CURRENT ON TAMOXIFEN AND ZOLADEX  . History of idiopathic seizure    DX 2008 --  LAST ONE 2012--  NO ISSUES SINCE AND NO MEDS. -- PT STATES DOCTORS FELT IT WAS STRESS RELATED DUE TO HEALTH ISSUES  . Hot flashes due to tamoxifen   . Hx MRSA infection 03/28/11   Skin  . Left ovarian cyst   . Nocturia   . Right lower quadrant abdominal pain   . Seizures ([place])    2016 last seizure   . Status post chemotherapy    docetaxel/carboplatin/trastuzumab  . Strains to urinate   . Urgency of urination     Assessment/Plan: 1 Day Post-Op Procedure(s) (LRB): TOTAL HIP ARTHROPLASTY ANTERIOR APPROACH (Left) Active Problems:   Osteonecrosis of hip ([facility])  Estimated body mass index is 19.27 kg/m as calculated from the following:   Height as of this encounter: 5' 4.5" (1.638 m).   Weight as of this encounter: 51.7 kg. Advance diet Up with therapy D/C IV fluids  DVT Prophylaxis - Xarelto Weight bearing as tolerated. D/C O2 and pulse ox and try on room air. Hemovac pulled without difficulty, will continue therapy.  Plan is to go Home after hospital stay. Ordered HHPT. Plan for discharge later if progresses with therapy  and meeting her goals. Follow-up in the office in 2 weeks.  Theresa Duty, PA-C Orthopedic Surgery 06/14/2018, 7:57 AM

## 2018-06-14 NOTE — Anesthesia Postprocedure Evaluation (Signed)
Anesthesia Post Note  Patient: Crystal [name]  Procedure(s) Performed: TOTAL HIP ARTHROPLASTY ANTERIOR APPROACH (Left Hip)     Patient location during evaluation: PACU Anesthesia Type: Spinal Level of consciousness: awake and alert Pain management: pain level controlled Vital Signs Assessment: post-procedure vital signs reviewed and stable Respiratory status: spontaneous breathing and respiratory function stable Cardiovascular status: blood pressure returned to baseline and stable Postop Assessment: spinal receding and no apparent nausea or vomiting Anesthetic complications: no    Last Vitals:  Vitals:   06/14/18 0209 06/14/18 0521  BP: 104/77 114/84  Pulse: 90 90  Resp: 17 17  Temp: 36.7 C 36.7 C  SpO2: 98% 100%    Last Pain:  Vitals:   06/14/18 0949  TempSrc:   PainSc: [name]

## 2018-06-14 NOTE — Care Management Obs Status (Signed)
[place] NOTIFICATION   Patient Details  Name: Crystal [name] MRN: [id number] Date of Birth: August 15, 1975   Medicare Observation Status Notification Given:  Yes    Guadalupe Maple, RN 06/14/2018, 12:11 PM

## 2018-06-14 NOTE — Evaluation (Signed)
Physical Therapy Evaluation Patient Details Name: Crystal [name] MRN: [id number] DOB: Oct 06, 1975 Today's Date: 06/14/2018   History of Present Illness  Pt s/p L THR and with hx of breast CA, R LE fx, and bipolar  Clinical Impression  Pt s/p L THR and presents with decreased L LE strength/ROM and post op pain limiting functional mobility.  Pt mobilizing this am at min guard/sup level and eager for dc home with assist of family.    Follow Up Recommendations Home health PT;Follow surgeon's recommendation for DC plan and follow-up therapies    Equipment Recommendations  None recommended by PT    Recommendations for Other Services       Precautions / Restrictions Precautions Precautions: Fall Restrictions Weight Bearing Restrictions: No LLE Weight Bearing: Weight bearing as tolerated      Mobility  Bed Mobility Overal bed mobility: Needs Assistance Bed Mobility: Supine to Sit     Supine to sit: Supervision     General bed mobility comments: cues for technique  Transfers Overall transfer level: Needs assistance Equipment used: Rolling walker (2 wheeled) Transfers: Sit to/from Stand Sit to Stand: Min guard;Supervision         General transfer comment: cues for LE management and use of UEs to self assist  Ambulation/Gait Ambulation/Gait assistance: Min guard Gait Distance (Feet): 222 Feet Assistive device: Rolling walker (2 wheeled) Gait Pattern/deviations: Step-to pattern;Step-through pattern;Decreased step length - right;Decreased step length - left;Shuffle;Trunk flexed Gait velocity: decr   General Gait Details: cues for posture, position from RW and initial sequence.  Stairs Stairs: Yes Stairs assistance: Min assist Stair Management: Two rails;Step to pattern;Forwards Number of Stairs: 5 General stair comments: cues for sequence  Wheelchair Mobility    Modified Rankin (Stroke Patients Only)       Balance Overall balance assessment: Mild deficits  observed, not formally tested                                           Pertinent Vitals/Pain Pain Assessment: 0-10 Pain Score: 6  Pain Location: L hip Pain Descriptors / Indicators: Aching;Sore Pain Intervention(s): Limited activity within patient's tolerance;Monitored during session;Premedicated before session;Ice applied    Home Living Family/patient expects to be discharged to:: Private residence Living Arrangements: Spouse/significant other;Other relatives Available Help at Discharge: Family;Available 24 hours/day Type of Home: House Home Access: Stairs to enter Entrance Stairs-Rails: Right;Left;Can reach both Entrance Stairs-Number of Steps: 4 Home Layout: Able to live on main level with bedroom/bathroom Home Equipment: Bedside commode;Shower seat;Walker - 2 wheels;Cane - single point      Prior Function Level of Independence: Independent;Independent with assistive device(s)         Comments: Using crutches just prior to surgery     Hand Dominance        Extremity/Trunk Assessment   Upper Extremity Assessment Upper Extremity Assessment: Overall WFL for tasks assessed    Lower Extremity Assessment Lower Extremity Assessment: LLE deficits/detail LLE Deficits / Details: Strength at hip 2+/5 with AAROM at hip to 95 flex and 20 abd    Cervical / Trunk Assessment Cervical / Trunk Assessment: Normal  Communication   Communication: No difficulties  Cognition Arousal/Alertness: Awake/alert Behavior During Therapy: WFL for tasks assessed/performed Overall Cognitive Status: Within Functional Limits for tasks assessed  General Comments      Exercises Total Joint Exercises Ankle Circles/Pumps: AROM;Both;15 reps;Supine Quad Sets: AROM;Both;10 reps;Supine Heel Slides: AAROM;Left;20 reps;Supine Hip ABduction/ADduction: AAROM;Left;15 reps;Supine Long Arc Quad: AROM;Left;10 reps;Seated    Assessment/Plan    PT Assessment Patient needs continued PT services  PT Problem List Decreased strength;Decreased range of motion;Decreased activity tolerance;Decreased mobility;Decreased knowledge of use of DME;Pain       PT Treatment Interventions DME instruction;Gait training;Stair training;Functional mobility training;Therapeutic activities;Therapeutic exercise;Patient/family education    PT Goals (Current goals can be found in the Care Plan section)  Acute Rehab PT Goals Patient Stated Goal: Regain IND PT Goal Formulation: All assessment and education complete, DC therapy    Frequency 7X/week   Barriers to discharge        Co-evaluation               AM-PAC PT "6 Clicks" Mobility  Outcome Measure Help needed turning from your back to your side while in a flat bed without using bedrails?: A Little Help needed moving from lying on your back to sitting on the side of a flat bed without using bedrails?: A Little Help needed moving to and from a bed to a chair (including a wheelchair)?: A Little Help needed standing up from a chair using your arms (e.g., wheelchair or bedside chair)?: A Little Help needed to walk in hospital room?: A Little Help needed climbing 3-5 steps with a railing? : A Little 6 Click Score: 18    End of Session Equipment Utilized During Treatment: Gait belt Activity Tolerance: Patient tolerated treatment well Patient left: in chair;with call bell/phone within reach;with chair alarm set Nurse Communication: Mobility status PT Visit Diagnosis: Difficulty in walking, not elsewhere classified (R26.2)    Time: 7116-5790 PT Time Calculation (min) (ACUTE ONLY): 45 min   Charges:   PT Evaluation $PT Eval Low Complexity: 1 Low PT Treatments $Gait Training: 8-22 mins $Therapeutic Exercise: 8-22 mins        Debe Coder PT Acute Rehabilitation Services Pager [phone] Office [phone]   Hettie Roselli 06/14/2018, 12:08  PM

## 2018-06-14 NOTE — Plan of Care (Signed)
Patient discharged home in stable condition 

## 2018-06-14 NOTE — Care Management CC44 (Signed)
Condition Code 44 Documentation Completed  Patient Details  Name: Crystal [name] MRN: [id number] Date of Birth: [birth year]   Condition Code 44 given:  Yes Patient signature on Condition Code 44 notice:  Yes Documentation of 2 MD's agreement:  Yes Code 44 added to claim:  Yes    Guadalupe Maple, RN 06/14/2018, 12:11 PM

## 2018-06-14 NOTE — Care Management Note (Signed)
Case Management Note  Patient Details  Name: Crystal [name] MRN: [id number] Date of Birth: 1976-04-11  Subjective/Objective:   Discharge planning, spoke with patient at bedside. Have chosen [facility] for [facility] PT, evaluate and treat.                 Action/Plan: Contacted [facility] for referral. They have accepted. Has DME. [phone]   Expected Discharge Date:  06/14/18               Expected Discharge Plan:  [place]  In-House Referral:  NA  Discharge planning Services  CM Consult  Post Acute Care Choice:  Home Health Choice offered to:  Patient  DME Arranged:  N/A DME Agency:  NA  HH Arranged:  PT [place] Agency:  [facility] (formerly [employer])  Status of Service:  Completed, signed off  If discussed at [employer], dates discussed:    Additional Comments:  Guadalupe Maple, RN 06/14/2018, 10:42 AM

## 2018-06-20 NOTE — Discharge Summary (Signed)
Physician Discharge Summary   Patient ID: Crystal [name] MRN: [id number] DOB/AGE: 07-17-1975 43 y.o.  Admit date: 06/13/2018 Discharge date: 06/14/2018  Primary Diagnosis: Osteoarthritis, left hip   Admission Diagnoses:  Past Medical History:  Diagnosis Date  . Anxiety   . Bipolar affective disorder, mixed ([place])   . Cancer ([place])   . Depression   . DJD (degenerative joint disease)    "BONE STICKS OUT ON LEFT PART OF LOWER BACK"  . Family history of anesthesia complication    MOTHER PONV  . Frequency of urination   . GERD (gastroesophageal reflux disease)    OCCASIONALLY TAKE ZANTAC  . History of breast cancer DX JUNE 2012 W/ RIGHT BREAST INVASIVE DUCTAL CA  STAGE IIB---  S/P BILATERAL MASTECTOMIES AND RIGHT NODE DISSECTION   ONCOLOGIST-- DR Jana Hakim--  CHEMO ENDED [place] 2012 / RADIATION ENDED MAR 2013--  CURRENT ON TAMOXIFEN AND ZOLADEX  . History of idiopathic seizure    DX 2008 --  LAST ONE 2012--  NO ISSUES SINCE AND NO MEDS. -- PT STATES DOCTORS FELT IT WAS STRESS RELATED DUE TO HEALTH ISSUES  . Hot flashes due to tamoxifen   . Hx MRSA infection 03/28/11   Skin  . Left ovarian cyst   . Nocturia   . Right lower quadrant abdominal pain   . Seizures ([place])    2016 last seizure   . Status post chemotherapy    docetaxel/carboplatin/trastuzumab  . Strains to urinate   . Urgency of urination    Discharge Diagnoses:   Active Problems:   Osteonecrosis of hip (HCC)  Estimated body mass index is 19.27 kg/m as calculated from the following:   Height as of this encounter: 5' 4.5" (1.638 m).   Weight as of this encounter: 51.7 kg.  Procedure:  Procedure(s) (LRB): TOTAL HIP ARTHROPLASTY ANTERIOR APPROACH (Left)   Consults: None  HPI: Crystal [name] is a 43 y.o. female who has advanced end-stage osteonecrosis of their Left  hip with progressively worsening pain and dysfunction.The patient has failed nonoperative management and presents for total hip arthroplasty.    Laboratory Data: Admission on 06/13/2018, Discharged on 06/14/2018  Component Date Value Ref Range Status  . WBC 06/14/2018 10.9* 4.0 - 10.5 K/uL Final  . RBC 06/14/2018 3.73* 3.87 - 5.11 MIL/uL Final  . Hemoglobin 06/14/2018 12.1  12.0 - 15.0 g/dL Final  . HCT 06/14/2018 36.2  36.0 - 46.0 % Final  . MCV 06/14/2018 97.1  80.0 - 100.0 fL Final  . MCH 06/14/2018 32.4  26.0 - 34.0 pg Final  . MCHC 06/14/2018 33.4  30.0 - 36.0 g/dL Final  . RDW 06/14/2018 11.6  11.5 - 15.5 % Final  . Platelets 06/14/2018 151  150 - 400 K/uL Final  . nRBC 06/14/2018 0.0  0.0 - 0.2 % Final   Performed at [facility], [place] [address], [place], [place] [address]  . Sodium 06/14/2018 139  135 - 145 mmol/L Final  . Potassium 06/14/2018 4.3  3.5 - 5.1 mmol/L Final  . Chloride 06/14/2018 107  98 - 111 mmol/L Final  . CO2 06/14/2018 26  22 - 32 mmol/L Final  . Glucose, Bld 06/14/2018 135* 70 - 99 mg/dL Final  . BUN 06/14/2018 13  6 - 20 mg/dL Final  . Creatinine, Ser 06/14/2018 0.82  0.44 - 1.00 mg/dL Final  . Calcium 06/14/2018 8.4* 8.9 - 10.3 mg/dL Final  . GFR calc non Af Amer 06/14/2018 >60  >60 mL/min Final  .  GFR calc Af Amer 06/14/2018 >60  >60 mL/min Final  . Anion gap 06/14/2018 6  5 - 15 Final   Performed at [facility], [place] [address] [place], [place] [address]  Hospital Outpatient Visit on 06/07/2018  Component Date Value Ref Range Status  . Preg, Serum 06/07/2018 NEGATIVE  NEGATIVE Final   Comment:        THE SENSITIVITY OF THIS METHODOLOGY IS >10 mIU/mL. Performed at [facility], [place] [address], [place], [place] [address]   . aPTT 06/07/2018 27  24 - 36 seconds Final   Performed at [facility], [place] [address] [place], [place] [address]  . WBC 06/07/2018 9.6  4.0 - 10.5 K/uL Final  . RBC 06/07/2018 4.95  3.87 - 5.11 MIL/uL Final  . Hemoglobin 06/07/2018 15.8* 12.0 - 15.0 g/dL Final  . HCT 06/07/2018 46.2* 36.0  - 46.0 % Final  . MCV 06/07/2018 93.3  80.0 - 100.0 fL Final  . MCH 06/07/2018 31.9  26.0 - 34.0 pg Final  . MCHC 06/07/2018 34.2  30.0 - 36.0 g/dL Final  . RDW 06/07/2018 11.6  11.5 - 15.5 % Final  . Platelets 06/07/2018 235  150 - 400 K/uL Final  . nRBC 06/07/2018 0.0  0.0 - 0.2 % Final   Performed at [facility], [place] [address] [place], [place] [address]  . Sodium 06/07/2018 138  135 - 145 mmol/L Final  . Potassium 06/07/2018 3.4* 3.5 - 5.1 mmol/L Final  . Chloride 06/07/2018 107  98 - 111 mmol/L Final  . CO2 06/07/2018 20* 22 - 32 mmol/L Final  . Glucose, Bld 06/07/2018 90  70 - 99 mg/dL Final  . BUN 06/07/2018 15  6 - 20 mg/dL Final  . Creatinine, Ser 06/07/2018 0.75  0.44 - 1.00 mg/dL Final  . Calcium 06/07/2018 9.2  8.9 - 10.3 mg/dL Final  . Total Protein 06/07/2018 7.1  6.5 - 8.1 g/dL Final  . Albumin 06/07/2018 4.7  3.5 - 5.0 g/dL Final  . AST 06/07/2018 23  15 - 41 U/L Final  . ALT 06/07/2018 16  0 - 44 U/L Final  . Alkaline Phosphatase 06/07/2018 61  38 - 126 U/L Final  . Total Bilirubin 06/07/2018 0.7  0.3 - 1.2 mg/dL Final  . GFR calc non Af Amer 06/07/2018 >60  >60 mL/min Final  . GFR calc Af Amer 06/07/2018 >60  >60 mL/min Final  . Anion gap 06/07/2018 11  5 - 15 Final   Performed at [facility], [place] [address], [place], [place] [address]  . Prothrombin Time 06/07/2018 13.4  11.4 - 15.2 seconds Final  . INR 06/07/2018 1.03   Final   Performed at [place] [address], [place], [place] [address]  . ABO/RH(D) 06/07/2018 O POS   Final  . Antibody Screen 06/07/2018 NEG   Final  . Sample Expiration 06/07/2018 06/16/2018   Final  . Extend sample reason 06/07/2018    Final                   Value:NO TRANSFUSIONS OR PREGNANCY IN THE PAST 3 MONTHS Performed at [facility], [place] [address] [place], [place] [address]   . MRSA, PCR 06/07/2018 NEGATIVE  NEGATIVE Final  . Staphylococcus aureus  06/07/2018 NEGATIVE  NEGATIVE Final   Comment: (NOTE) The Xpert SA Assay (FDA approved for NASAL specimens in patients 23 years of age and  older), is one component of a comprehensive surveillance program. It is not intended to diagnose infection nor to guide or monitor treatment. Performed at [facility], [place] [address] [place], [place] [address]      X-Rays:Dg Pelvis Portable  Result Date: 06/13/2018 CLINICAL DATA:  Post LEFT total hip replacement EXAM: PORTABLE PELVIS 1-2 VIEWS COMPARISON:  Portable exam 1600 hours compared to earlier intraoperative images of 06/13/2018 FINDINGS: LEFT hip prosthesis identified in expected position. No fracture, dislocation, or bone destruction. Surgical drain overlies the surgical bed. IMPRESSION: LEFT hip prosthesis without acute complication. Electronically Signed   By: Lavonia Dana M.D.   On: 06/13/2018 19:30   Dg C-arm 1-60 Min-no Report  Result Date: 06/13/2018 Fluoroscopy was utilized by the requesting physician.  No radiographic interpretation.   Dg Hip Operative Unilat W Or W/o Pelvis Left  Result Date: 06/13/2018 CLINICAL DATA:  Left hip replacement EXAM: OPERATIVE LEFT HIP (WITH PELVIS IF PERFORMED) 5 VIEWS TECHNIQUE: Fluoroscopic spot image(s) were submitted for interpretation post-operatively. COMPARISON:  None. FINDINGS: Multiple intraoperative spot images demonstrate changes of left hip replacement. Normal AP alignment. No hardware bony complicating feature. IMPRESSION: Left hip replacement.  No visible complicating feature. Electronically Signed   By: Rolm Baptise M.D.   On: 06/13/2018 19:25    EKG: Orders placed or performed during the hospital encounter of 10/14/11  . EKG 12-Lead  . EKG 12-Lead  . ED EKG  . ED EKG  . EKG 12-Lead  . EKG 12-Lead  . EKG     Hospital Course: Crystal [name] is a 42 y.o. who was admitted to [facility]. They were brought to the operating room on 06/13/2018 and underwent  Procedure(s): [place].  Patient tolerated the procedure well and was later transferred to the recovery room and then to the orthopaedic floor for postoperative care. They were given PO and IV analgesics for pain control following their surgery. They were given 24 hours of postoperative antibiotics of  Anti-infectives (From admission, onward)   Start     Dose/Rate Route Frequency Ordered Stop   06/14/18 0600  ceFAZolin (ANCEF) IVPB 2g/100 mL premix     2 g 200 mL/hr over 30 Minutes Intravenous On call to O.R. 06/13/18 1242 06/13/18 1421   06/13/18 2030  ceFAZolin (ANCEF) IVPB 1 g/50 mL premix     1 g 100 mL/hr over 30 Minutes Intravenous Every 6 hours 06/13/18 1743 06/14/18 0207     and started on DVT prophylaxis in the form of Xarelto.   PT and OT were ordered for total joint protocol. Discharge planning consulted to help with postop disposition and equipment needs.  Patient had a good night on the evening of surgery. They started to get up OOB with therapy on POD #0. Pt was seen during rounds and was ready to go home pending progress with therapy. Hemovac drain was pulled without difficulty. She worked with therapy on POD #1 and was meeting her goals. Pt was discharged to home later that day in stable condition.  Diet: Regular diet Activity: WBAT Follow-up: in 2 weeks Disposition: Home with HHPT Discharged Condition: stable   Discharge Instructions    Call MD / Call 911   Complete by:  As directed    If you experience chest pain or shortness of breath, CALL 911 and be transported to the hospital emergency room.  If you develope a fever above 101 F, pus (white drainage) or increased drainage or  redness at the wound, or calf pain, call your surgeon's office.   Change dressing   Complete by:  As directed    You may change your dressing on Friday, then change the dressing daily with sterile 4 x 4 inch gauze dressing and paper tape.   Constipation Prevention    Complete by:  As directed    Drink plenty of fluids.  Prune juice may be helpful.  You may use a stool softener, such as Colace (over the counter) 100 mg twice a day.  Use MiraLax (over the counter) for constipation as needed.   Diet - low sodium heart healthy   Complete by:  As directed    Discharge instructions   Complete by:  As directed    Dr. Gaynelle Arabian Total Joint Specialist [facility] [address] [place], [place] [address] [phone]  ANTERIOR APPROACH TOTAL HIP REPLACEMENT POSTOPERATIVE DIRECTIONS   Hip Rehabilitation, Guidelines Following Surgery  The results of a hip operation are greatly improved after range of motion and muscle strengthening exercises. Follow all safety measures which are given to protect your hip. If any of these exercises cause increased pain or swelling in your joint, decrease the amount until you are comfortable again. Then slowly increase the exercises. Call your caregiver if you have problems or questions.   HOME CARE INSTRUCTIONS  Remove items at home which could result in a fall. This includes throw rugs or furniture in walking pathways.  ICE to the affected hip every three hours for 30 minutes at a time and then as needed for pain and swelling.  Continue to use ice on the hip for pain and swelling from surgery. You may notice swelling that will progress down to the foot and ankle.  This is normal after surgery.  Elevate the leg when you are not up walking on it.   Continue to use the breathing machine which will help keep your temperature down.  It is common for your temperature to cycle up and down following surgery, especially at night when you are not up moving around and exerting yourself.  The breathing machine keeps your lungs expanded and your temperature down.  DIET You may resume your previous home diet once your are discharged from the hospital.  DRESSING / WOUND CARE / SHOWERING You may shower 3 days after surgery,  but keep the wounds dry during showering.  You may use an occlusive plastic wrap (Press'n Seal for example), NO SOAKING/SUBMERGING IN THE BATHTUB.  If the bandage gets wet, change with a clean dry gauze.  If the incision gets wet, pat the wound dry with a clean towel. You may start showering once you are discharged home but do not submerge the incision under water. Just pat the incision dry and apply a dry gauze dressing on daily. Change the surgical dressing daily and reapply a dry dressing each time.  ACTIVITY Walk with your walker as instructed. Use walker as long as suggested by your caregivers. Avoid periods of inactivity such as sitting longer than an hour when not asleep. This helps prevent blood clots.  You may resume a sexual relationship in one month or when given the OK by your doctor.  You may return to work once you are cleared by your doctor.  Do not drive a car for 6 weeks or until released by you surgeon.  Do not drive while taking narcotics.  WEIGHT BEARING Weight bearing as tolerated with assist device (walker, cane, etc)  as directed, use it as long as suggested by your surgeon or therapist, typically at least 4-6 weeks.  POSTOPERATIVE CONSTIPATION PROTOCOL Constipation - defined medically as fewer than three stools per week and severe constipation as less than one stool per week.  One of the most common issues patients have following surgery is constipation.  Even if you have a regular bowel pattern at home, your normal regimen is likely to be disrupted due to multiple reasons following surgery.  Combination of anesthesia, postoperative narcotics, change in appetite and fluid intake all can affect your bowels.  In order to avoid complications following surgery, here are some recommendations in order to help you during your recovery period.  Colace (docusate) - Pick up an over-the-counter form of Colace or another stool softener and take twice a day as long as you are requiring  postoperative pain medications.  Take with a full glass of water daily.  If you experience loose stools or diarrhea, hold the colace until you stool forms back up.  If your symptoms do not get better within 1 week or if they get worse, check with your doctor.  Dulcolax (bisacodyl) - Pick up over-the-counter and take as directed by the product packaging as needed to assist with the movement of your bowels.  Take with a full glass of water.  Use this product as needed if not relieved by Colace only.   MiraLax (polyethylene glycol) - Pick up over-the-counter to have on hand.  MiraLax is a solution that will increase the amount of water in your bowels to assist with bowel movements.  Take as directed and can mix with a glass of water, juice, soda, coffee, or tea.  Take if you go more than two days without a movement. Do not use MiraLax more than once per day. Call your doctor if you are still constipated or irregular after using this medication for 7 days in a row.  If you continue to have problems with postoperative constipation, please contact the office for further assistance and recommendations.  If you experience "the worst abdominal pain ever" or develop nausea or vomiting, please contact the office immediatly for further recommendations for treatment.  ITCHING  If you experience itching with your medications, try taking only a single pain pill, or even half a pain pill at a time.  You can also use Benadryl over the counter for itching or also to help with sleep.   TED HOSE STOCKINGS Wear the elastic stockings on both legs for three weeks following surgery during the day but you may remove then at night for sleeping.  MEDICATIONS See your medication summary on the "After Visit Summary" that the nursing staff will review with you prior to discharge.  You may have some home medications which will be placed on hold until you complete the course of blood thinner medication.  It is important for you to  complete the blood thinner medication as prescribed by your surgeon.  Continue your approved medications as instructed at time of discharge.  PRECAUTIONS If you experience chest pain or shortness of breath - call 911 immediately for transfer to the hospital emergency department.  If you develop a fever greater that 101 F, purulent drainage from wound, increased redness or drainage from wound, foul odor from the wound/dressing, or calf pain - CONTACT YOUR SURGEON.  FOLLOW-UP APPOINTMENTS Make sure you keep all of your appointments after your operation with your surgeon and caregivers. You should call the office at the above phone number and make an appointment for approximately two weeks after the date of your surgery or on the date instructed by your surgeon outlined in the "After Visit Summary".  RANGE OF MOTION AND STRENGTHENING EXERCISES  These exercises are designed to help you keep full movement of your hip joint. Follow your caregiver's or physical therapist's instructions. Perform all exercises about fifteen times, three times per day or as directed. Exercise both hips, even if you have had only one joint replacement. These exercises can be done on a training (exercise) mat, on the floor, on a table or on a bed. Use whatever works the best and is most comfortable for you. Use music or television while you are exercising so that the exercises are a pleasant break in your day. This will make your life better with the exercises acting as a break in routine you can look forward to.  Lying on your back, slowly slide your foot toward your buttocks, raising your knee up off the floor. Then slowly slide your foot back down until your leg is straight again.  Lying on your back spread your legs as far apart as you can without causing discomfort.  Lying on your side, raise your upper leg and foot straight up from the floor as far as is comfortable. Slowly  lower the leg and repeat.  Lying on your back, tighten up the muscle in the front of your thigh (quadriceps muscles). You can do this by keeping your leg straight and trying to raise your heel off the floor. This helps strengthen the largest muscle supporting your knee.  Lying on your back, tighten up the muscles of your buttocks both with the legs straight and with the knee bent at a comfortable angle while keeping your heel on the floor.   IF YOU ARE TRANSFERRED TO A SKILLED REHAB FACILITY If the patient is transferred to a skilled rehab facility following release from the hospital, a list of the current medications will be sent to the facility for the patient to continue.  When discharged from the skilled rehab facility, please have the facility set up the patient's [place] prior to being released. Also, the skilled facility will be responsible for providing the patient with their medications at time of release from the facility to include their pain medication, the muscle relaxants, and their blood thinner medication. If the patient is still at the rehab facility at time of the two week follow up appointment, the skilled rehab facility will also need to assist the patient in arranging follow up appointment in our office and any transportation needs.  MAKE SURE YOU:  Understand these instructions.  Get help right away if you are not doing well or get worse.    Pick up stool softner and laxative for home use following surgery while on pain medications. Do not submerge incision under water. Please use good hand washing techniques while changing dressing each day. May shower starting three days after surgery. Please use a clean towel to pat the incision dry following showers. Continue to use ice for pain and swelling after surgery. Do not use any lotions or creams on the incision until instructed by your surgeon.   Do not sit on low chairs, stoools or toilet seats, as it may  be difficult to get up from low  surfaces   Complete by:  As directed    Driving restrictions   Complete by:  As directed    No driving for two weeks   TED hose   Complete by:  As directed    Use stockings (TED hose) for three weeks on both leg(s).  You may remove them at night for sleeping.   Weight bearing as tolerated   Complete by:  As directed      Allergies as of 06/14/2018      Reactions   Tramadol Other (See Comments)   Seizures   Phenytoin Nausea And Vomiting   Thorazine [chlorpromazine Hcl] Hives   Ciprofloxacin Nausea And Vomiting   Vancomycin Rash, Other (See Comments)   Possible rash reported by MD      Medication List    STOP taking these medications   meloxicam 15 MG tablet Commonly known as:  MOBIC     TAKE these medications   albuterol 108 (90 Base) MCG/ACT inhaler Commonly known as:  PROVENTIL HFA;VENTOLIN HFA Inhale 2 puffs into the lungs every 4 (four) hours as needed for wheezing or shortness of breath.   ALPRAZolam 1 MG tablet Commonly known as:  XANAX TAKE 1 TABLET BY MOUTH 3 TIMES A DAY AS NEEDED FOR ANXIETY What changed:    how much to take  how to take this  when to take this  additional instructions   esomeprazole 20 MG packet Commonly known as:  NEXIUM Take 20 mg by mouth daily before breakfast.   gabapentin 800 MG tablet Commonly known as:  NEURONTIN Take 800 mg by mouth 3 (three) times daily.   lamoTRIgine 150 MG tablet Commonly known as:  LAMICTAL Take 300 mg by mouth at bedtime.   methocarbamol 500 MG tablet Commonly known as:  ROBAXIN Take 1 tablet (500 mg total) by mouth every 6 (six) hours as needed for muscle spasms.   oxyCODONE 5 MG immediate release tablet Commonly known as:  Oxy IR/ROXICODONE Take 1-2 tablets (5-10 mg total) by mouth every 6 (six) hours as needed for moderate pain (pain score 4-6).   rivaroxaban 10 MG Tabs tablet Commonly known as:  XARELTO Take 1 tablet (10 mg total) by mouth daily with  breakfast for 20 days. Then take one 81 mg aspirin once a day for three weeks. Then discontinue aspirin.            Discharge Care Instructions  (From admission, onward)         Start     Ordered   06/14/18 0000  Weight bearing as tolerated     06/14/18 0802   06/14/18 0000  Change dressing    Comments:  You may change your dressing on Friday, then change the dressing daily with sterile 4 x 4 inch gauze dressing and paper tape.   06/14/18 0802         Follow-up Information    Gaynelle Arabian, MD. Schedule an appointment as soon as possible for a visit on 06/28/2018.   Specialty:  Orthopedic Surgery Contact information: [address] [place] [address] [phone]        [facility] Follow up.   Specialty:  [place] Why:  physical therapy Contact information: [place] [address] [phone]           Signed: Theresa Duty, PA-C Orthopedic Surgery 06/20/2018, 9:29 AM

## 2018-07-02 ENCOUNTER — Inpatient Hospital Stay: Payer: Medicare Other | Attending: Oncology

## 2018-07-02 DIAGNOSIS — Z923 Personal history of irradiation: Secondary | ICD-10-CM | POA: Insufficient documentation

## 2018-07-02 DIAGNOSIS — Z853 Personal history of malignant neoplasm of breast: Secondary | ICD-10-CM | POA: Insufficient documentation

## 2018-07-02 DIAGNOSIS — Z9013 Acquired absence of bilateral breasts and nipples: Secondary | ICD-10-CM | POA: Insufficient documentation

## 2018-07-02 DIAGNOSIS — Z170 Estrogen receptor positive status [ER+]: Secondary | ICD-10-CM | POA: Insufficient documentation

## 2018-07-02 DIAGNOSIS — Z9221 Personal history of antineoplastic chemotherapy: Secondary | ICD-10-CM | POA: Insufficient documentation

## 2018-07-04 NOTE — Progress Notes (Signed)
[place]  [phone]) [phone] [phone]) [phone]     ID: Crystal [name]   DOB: 18-Mar-1976  MR#: [id number]  FOY#:[id number]  Patient Care Team: Elenore Paddy, [place] as PCP - General (Family Medicine) [name], Virgie Dad, MD as Consulting Physician (Oncology) Gaynelle Arabian, MD as Consulting Physician (Orthopedic Surgery) SU:  Lorine Bears, MD OTHER: Leigh Aurora, Rupinder [facility]   CHIEF COMPLAINT:  Hx of Right Breast Cancer  CURRENT THERAPY: Observation    BREAST CANCER HISTORY: From the initial intake note:  "The patient is a 43 year old [name] who noted a mass in her right breast and brought it to her primary care physician's attention June of 2012. She was set up for mammography at [facility], where an area of calcifications highly suspicious for carcinoma was biopsied. This was in the upper outer quadrant and it measured 1.7 cm mammographically and 1.8 cm by ultrasonography. The pathology report ([id number]) showed a high-grade invasive ductal carcinoma estrogen receptor positive at 64% progesterone receptor positive at 18%, and HER-2 amplified with a ratio by CISH of 5.24. MIB-1-1 was 65%. A second mass biopsied at the same time was also triple positive.   Given her multiple masses in the right breast, mastectomy was recommended. The patient actually opted for bilateral mastectomies and this was performed together with a right axillary lymph node dissection under Dr. Star Age on 11/16/2010. The final pathology [id number]) showed, on the left, no evidence of cancer. On the right the patient had a high-grade invasive ductal carcinoma measuring 1.9 cm, with negative margins, grade 3, involving 2 of 16 lymph nodes (stage IIB)."  Her subsequent history is as detailed below.   INTERVAL HISTORY:  Crystal [name] returns today for follow-up of her right breast cancer.   She continues under observation.   Since her last visit here, she underwent a series of xrays to the  right lower extremity on 11/20/2017 after a fall showing:  Essentially nondisplaced fracture of the proximal medial tibial metaphysis with extension into the medial and lateral tibial plateaus (Schatzker type IV fracture). Small knee lipohemarthrosis.  As a result of this fracture, she underwent open reduction internal fixation (ORIF) to repair this on 11/27/2017.   She also underwent a left hip replacement on 06/13/2018.    REVIEW OF SYSTEMS: Dulcy says that her hip is bothering her today, which she believes may be affected by the weather, but she notes that it feels much better than it did before the surgery. She had some patchy skin in the right chest wall, she has treated it with Cortizone, with only mild temporary relief. The patient denies unusual headaches, visual changes, nausea, vomiting, or dizziness. There has been no unusual cough, phlegm production, or pleurisy. This been no change in bowel or bladder habits. The patient denies unexplained fatigue or unexplained weight loss, bleeding, rash, or fever. A detailed review of systems was otherwise noncontributory.    PAST MEDICAL HISTORY: Past Medical History:  Diagnosis Date  . Anxiety   . Bipolar affective disorder, mixed ([place])   . Cancer ([place])   . Depression   . DJD (degenerative joint disease)    "BONE STICKS OUT ON LEFT PART OF LOWER BACK"  . Family history of anesthesia complication    MOTHER PONV  . Frequency of urination   . GERD (gastroesophageal reflux disease)    OCCASIONALLY TAKE ZANTAC  . History of breast cancer DX JUNE 2012 W/ RIGHT BREAST INVASIVE DUCTAL CA  STAGE IIB---  S/P BILATERAL  MASTECTOMIES AND RIGHT NODE DISSECTION   ONCOLOGIST-- DR Jana Hakim--  CHEMO ENDED [place] 2012 / RADIATION ENDED MAR 2013--  CURRENT ON TAMOXIFEN AND ZOLADEX  . History of idiopathic seizure    DX 2008 --  LAST ONE 2012--  NO ISSUES SINCE AND NO MEDS. -- PT STATES DOCTORS FELT IT WAS STRESS RELATED DUE TO HEALTH ISSUES  . Hot flashes  due to tamoxifen   . Hx MRSA infection 03/28/11   Skin  . Left ovarian cyst   . Nocturia   . Right lower quadrant abdominal pain   . Seizures ([place])    2016 last seizure   . Status post chemotherapy    docetaxel/carboplatin/trastuzumab  . Strains to urinate   . Urgency of urination   1. History of seizures. The patient has been thoroughly evaluated both here by Dr. Gaynell Face and at [facility] for this. Among many studies, she had an MRI of the brain on August 28th. This showed an incidental solitary small lesion in the left frontoparietal white matter. It was not felt to be suggestive of multiple sclerosis. A noncontrasted CT in August 2010 was negative. The patient has been off all seizure medications now for 2 months. There has been no evidence of seizure recurrence. 2. Complex psychology history (please refer to Dr. Iona Coach note in the E-chart from August 29, 2009) with evidence of anxiety disorder, depression and psychosis. The patient is currently on no medications for this and denies any of these symptoms at present. 3. History of migraines. 4. Remote history of multidrug abuse. 5. Status post wisdom teeth extraction.         History of MRSA skin infection   PAST SURGICAL HISTORY: Past Surgical History:  Procedure Laterality Date  . BIOPSY BREAST  10/14/2010   right needle core biopsy  . BREAST SURGERY  11/16/2010   bilateral mastectomy+ right axillary node dissection,T1cN1a, Her2+,ERPR+  (right breast invasive ductal carcinoma)  . CYSTO WITH HYDRODISTENSION N/A 07/23/2012   Procedure: CYSTOSCOPY/HYDRODISTENSION instillation of marcaine and pyridium;  Surgeon: Reece Packer, MD;  Location: [facility];  Service: Urology;  Laterality: N/A;  INSTILLATION    . I&D EXTREMITY  09/26/2011   Procedure: IRRIGATION AND DEBRIDEMENT EXTREMITY;  Surgeon: Tennis Must, MD;  Location: [facility];  Service: Orthopedics;  Laterality: Right;  I&D right hand cat bite wound  . ORIF  TIBIA PLATEAU Right 11/27/2017   Procedure: OPEN REDUCTION INTERNAL FIXATION (ORIF) TIBIAL PLATEAU FRACTURE;  Surgeon: Gaynelle Arabian, MD;  Location: [facility];  Service: Orthopedics;  Laterality: Right;  . PORTACATH PLACEMENT  11/16/2010   placement of left subclavian port NOW REMOVED  . right knee torn meniscus surgery     . TOTAL HIP ARTHROPLASTY Left 06/13/2018   Procedure: TOTAL HIP ARTHROPLASTY ANTERIOR APPROACH;  Surgeon: Gaynelle Arabian, MD;  Location: [facility];  Service: Orthopedics;  Laterality: Left;  117mn  . TRANSTHORACIC ECHOCARDIOGRAM  05-28-2012   LVF NORMAL/  EF 55-60%  . WISDOM TOOTH EXTRACTION      FAMILY HISTORY: Family History  Problem Relation Age of Onset  . Cancer Maternal Grandfather   . Mental illness Brother   . Mental illness Son   The patient's mother is living, in her mid 570s with no history of breast or ovarian cancer. The patient does not know her biological father. The patient has an older brother with bipolar disease, and according to the patient, borderline schizophrenia.   GYNECOLOGIC HISTORY: (Updated January 2015) Menarche at age  14. She is G1, P1 with first pregnancy to term at age 37. She was not menopausal, and was still having regular periods at the time of her breast cancer diagnosis. She continues on every three-month goserelin.   SOCIAL HISTORY:  (Updated 05/21/2013) She used to work in a [employer] but had to leave that job because of the seizure problem. She's currently unemployed. Her husband, Glendell Docker, works as a Development worker, international aid.  Carl's mother also lives with them.  Porschea has a son, Liane Comber, who is 60 as of 07/2018 and also lives in [place], currently with the patient's parents.  He works as a delivery for Administrator, sports.  The patient attends a local [place]. Peggyann Juba is pastor.    ADVANCED DIRECTIVES: Not in place   HEALTH MAINTENANCE:  (Updated 05/21/2013) Social History   Tobacco Use  . Smoking status: Current Every Day  Smoker    Packs/day: 1.00    Years: 20.00    Pack years: 20.00    Types: Cigarettes  . Smokeless tobacco: Never Used  Substance Use Topics  . Alcohol use: Yes    Comment: 15 beers per week ON AND OFF  . Drug use: No    Comment: LAST USED COCAINE AND MARIJIUANA 2011     Colonoscopy: Never  PAP: July 2012  Bone density:  Never  Lipid panel: Not on file  Allergies  Allergen Reactions  . Tramadol Other (See Comments)    Seizures  . Phenytoin Nausea And Vomiting  . Thorazine [Chlorpromazine Hcl] Hives  . Ciprofloxacin Nausea And Vomiting  . Vancomycin Rash and Other (See Comments)    Possible rash reported by MD    Current Outpatient Medications  Medication Sig Dispense Refill  . albuterol (PROVENTIL HFA;VENTOLIN HFA) 108 (90 Base) MCG/ACT inhaler Inhale 2 puffs into the lungs every 4 (four) hours as needed for wheezing or shortness of breath.   0  . ALPRAZolam (XANAX) 1 MG tablet TAKE 1 TABLET BY MOUTH 3 TIMES A DAY AS NEEDED FOR ANXIETY (Patient taking differently: Take 1 mg by mouth 3 (three) times daily. ) 90 tablet 1  . esomeprazole (NEXIUM) 20 MG packet Take 20 mg by mouth daily before breakfast.    . gabapentin (NEURONTIN) 800 MG tablet Take 800 mg by mouth 3 (three) times daily.    Marland Kitchen lamoTRIgine (LAMICTAL) 150 MG tablet Take 300 mg by mouth at bedtime.  2  . methocarbamol (ROBAXIN) 500 MG tablet Take 1 tablet (500 mg total) by mouth every 6 (six) hours as needed for muscle spasms. 40 tablet 0  . oxyCODONE (OXY IR/ROXICODONE) 5 MG immediate release tablet Take 1-2 tablets (5-10 mg total) by mouth every 6 (six) hours as needed for moderate pain (pain score 4-6). 56 tablet 0  . rivaroxaban (XARELTO) 10 MG TABS tablet Take 1 tablet (10 mg total) by mouth daily with breakfast for 20 days. Then take one 81 mg aspirin once a day for three weeks. Then discontinue aspirin. 20 tablet 0   No current facility-administered medications for this visit.    Facility-Administered  Medications Ordered in Other Visits  Medication Dose Route Frequency Provider Last Rate Last Dose  . goserelin (ZOLADEX) injection 10.8 mg  10.8 mg Subcutaneous Q90 days [name], Virgie Dad, MD   10.8 mg at 02/02/16 1501    OBJECTIVE: Young white woman no acute distress  Vitals:   07/05/18 1330  BP: (!) 115/91  Pulse: 88  Resp: 19  Temp: 98.5 F (36.9 C)  SpO2: 99%  ECOG: 2 Body mass index is 18.79 kg/m. Filed Weights   07/05/18 1330  Weight: 111 lb 3.2 oz (50.4 kg)    Sclerae unicteric, EOMs intact No cervical or supraclavicular adenopathy Lungs no rales or rhonchi Heart regular rate and rhythm Abd soft, nontender, positive bowel sounds MSK no focal spinal tenderness, no upper extremity lymphedema Neuro: nonfocal, well oriented, appropriate affect Breasts: Status post bilateral mastectomies.  There is no evidence of disease recurrence. Skin: In the upper anterior chest, the upper part of the radiation port area, slightly medially, there is a patch of skin consistent with early lichen planus   LAB RESULTS: Lab Results  Component Value Date   WBC 10.9 (H) 06/14/2018   NEUTROABS 9.3 (H) 02/01/2018   HGB 12.1 06/14/2018   HCT 36.2 06/14/2018   MCV 97.1 06/14/2018   PLT 151 06/14/2018      Chemistry      Component Value Date/Time   NA 139 06/14/2018 0521   NA 140 01/03/2017 1420   K 4.3 06/14/2018 0521   K 3.5 01/03/2017 1420   CL 107 06/14/2018 0521   CL 105 10/11/2012 1441   CO2 26 06/14/2018 0521   CO2 25 01/03/2017 1420   BUN 13 06/14/2018 0521   BUN 15.2 01/03/2017 1420   CREATININE 0.82 06/14/2018 0521   CREATININE 0.9 01/03/2017 1420      Component Value Date/Time   CALCIUM 8.4 (L) 06/14/2018 0521   CALCIUM 9.7 01/03/2017 1420   ALKPHOS 61 06/07/2018 1030   ALKPHOS 115 01/03/2017 1420   AST 23 06/07/2018 1030   AST 31 01/03/2017 1420   ALT 16 06/07/2018 1030   ALT 27 01/03/2017 1420   BILITOT 0.7 06/07/2018 1030   BILITOT 0.41 01/03/2017 1420           STUDIES: Dg Pelvis Portable  Result Date: 06/13/2018 CLINICAL DATA:  Post LEFT total hip replacement EXAM: PORTABLE PELVIS 1-2 VIEWS COMPARISON:  Portable exam 1600 hours compared to earlier intraoperative images of 06/13/2018 FINDINGS: LEFT hip prosthesis identified in expected position. No fracture, dislocation, or bone destruction. Surgical drain overlies the surgical bed. IMPRESSION: LEFT hip prosthesis without acute complication. Electronically Signed   By: Lavonia Dana M.D.   On: 06/13/2018 19:30   Dg C-arm 1-60 Min-no Report  Result Date: 06/13/2018 Fluoroscopy was utilized by the requesting physician.  No radiographic interpretation.   Dg Hip Operative Unilat W Or W/o Pelvis Left  Result Date: 06/13/2018 CLINICAL DATA:  Left hip replacement EXAM: OPERATIVE LEFT HIP (WITH PELVIS IF PERFORMED) 5 VIEWS TECHNIQUE: Fluoroscopic spot image(s) were submitted for interpretation post-operatively. COMPARISON:  None. FINDINGS: Multiple intraoperative spot images demonstrate changes of left hip replacement. Normal AP alignment. No hardware bony complicating feature. IMPRESSION: Left hip replacement.  No visible complicating feature. Electronically Signed   By: Rolm Baptise M.D.   On: 06/13/2018 19:25     ASSESSMENT: 43 y.o.  [place] woman, known to be BRCA1 and 2 negative,   (1) status post bilateral mastectomies May of 2012 for a right-sided T1c N1 (stage II) grade 3 invasive ductal carcinoma,estrogen and progesterone receptor positive, HER-2 amplified, with an MIB-1 of 85%.   (2) status post carboplatin/ docetaxol/ trastuzumab x6, completed mid December 2012   (3) on trastuzumab every 3 weeks, started August of 2012, interrupted between April and August of 2013, completed 04/27/2012. Most recent echo 08/20/2012  (4) status post radiation therapy, completed 07/12/2011  (5) On tamoxifen starting April 2013,  held as of 12/27/2012 with the diagnosis of a left upper extremity DVT.   On therapeutic Coumadin until  May 2015 when port removed and coumadin discontinued  (6) On goserelin given every 3 months, 1st injection on 10/26/11, discontinued June 2018 at patient's discretion  (7) started on anastrozole November 2014, discontinued January 2015 because of side effects  (8) started letrozole August 2015, discontinued March 2018 at patient's discretion  Other problems:  (1) Chronic Pain/ fibromyalgia  (2) history of seizure disorder   (3) history of severe migraines   (4)  Severe proteincalorie malnutrition: resolved   PLAN:  Leiah is now just about 8 years out from definitive surgery for her breast cancer with no evidence of disease recurrence.  This is very favorable.  She tells me her mother-in-law, who lives with them has been diagnosed with the coronavirus.  I really do not think this is the case although it is possible there has been some confusion.  At any rate they do need to clarify this because if they have been exposed in some way they will need to be quarantined.  Tessica tells me she is going to discuss it with her mother-in-law's doctor.  For the itchy patch of skin in the upper anterior right chest I have prescribed some Benadryl cream.  I have encouraged her to gain about 5 pounds between now and the next visit here.  Otherwise she will return to see me in 1 year.  She knows to call for any other issues that may develop before the next visit.  [name], Virgie Dad, MD  07/05/18 1:55 PM Medical Oncology and Hematology [facility] [address] [place], [place] [address] Tel. [phone]    Fax. [phone]   I, Jacqualyn Posey am acting as a Education administrator for Chauncey Cruel, MD.   I, Lurline Del MD, have reviewed the above documentation for accuracy and completeness, and I agree with the above.

## 2018-07-05 ENCOUNTER — Inpatient Hospital Stay (HOSPITAL_BASED_OUTPATIENT_CLINIC_OR_DEPARTMENT_OTHER): Payer: Medicare Other | Admitting: Oncology

## 2018-07-05 ENCOUNTER — Telehealth: Payer: Self-pay | Admitting: Oncology

## 2018-07-05 VITALS — BP 115/91 | HR 88 | Temp 98.5°F | Resp 19 | Ht 64.5 in | Wt 111.2 lb

## 2018-07-05 DIAGNOSIS — Z170 Estrogen receptor positive status [ER+]: Secondary | ICD-10-CM

## 2018-07-05 DIAGNOSIS — Z853 Personal history of malignant neoplasm of breast: Secondary | ICD-10-CM | POA: Diagnosis present

## 2018-07-05 DIAGNOSIS — Z923 Personal history of irradiation: Secondary | ICD-10-CM | POA: Diagnosis not present

## 2018-07-05 DIAGNOSIS — Z9221 Personal history of antineoplastic chemotherapy: Secondary | ICD-10-CM | POA: Diagnosis not present

## 2018-07-05 DIAGNOSIS — Z171 Estrogen receptor negative status [ER-]: Principal | ICD-10-CM

## 2018-07-05 DIAGNOSIS — Z9013 Acquired absence of bilateral breasts and nipples: Secondary | ICD-10-CM | POA: Diagnosis not present

## 2018-07-05 DIAGNOSIS — C50411 Malignant neoplasm of upper-outer quadrant of right female breast: Secondary | ICD-10-CM

## 2018-07-05 MED ORDER — DIPHENHYDRAMINE-ZINC ACETATE 2-0.1 % EX CREA: 1.0000 "application " | TOPICAL_CREAM | Freq: Three times a day (TID) | CUTANEOUS | 0 refills | Status: DC | PRN

## 2018-07-05 NOTE — Telephone Encounter (Signed)
Gave avs and calendar ° °

## 2018-10-28 ENCOUNTER — Emergency Department (HOSPITAL_COMMUNITY): Payer: Medicare Other

## 2018-10-28 ENCOUNTER — Emergency Department (HOSPITAL_COMMUNITY)
Admission: EM | Admit: 2018-10-28 | Discharge: 2018-10-28 | Disposition: A | Discharge status: Home / Self Care | Payer: Medicare Other | Attending: Emergency Medicine | Admitting: Emergency Medicine

## 2018-10-28 DIAGNOSIS — Z79899 Other long term (current) drug therapy: Secondary | ICD-10-CM | POA: Diagnosis not present

## 2018-10-28 DIAGNOSIS — F17210 Nicotine dependence, cigarettes, uncomplicated: Secondary | ICD-10-CM | POA: Diagnosis not present

## 2018-10-28 DIAGNOSIS — F3010 Manic episode without psychotic symptoms, unspecified: Secondary | ICD-10-CM | POA: Diagnosis not present

## 2018-10-28 DIAGNOSIS — F1510 Other stimulant abuse, uncomplicated: Secondary | ICD-10-CM | POA: Diagnosis not present

## 2018-10-28 DIAGNOSIS — T50905A Adverse effect of unspecified drugs, medicaments and biological substances, initial encounter: Secondary | ICD-10-CM | POA: Diagnosis not present

## 2018-10-28 DIAGNOSIS — R4182 Altered mental status, unspecified: Secondary | ICD-10-CM | POA: Diagnosis present

## 2018-10-28 DIAGNOSIS — F419 Anxiety disorder, unspecified: Secondary | ICD-10-CM | POA: Diagnosis not present

## 2018-10-28 LAB — COMPREHENSIVE METABOLIC PANEL
ALT: 16 U/L (ref 0–44)
AST: 22 U/L (ref 15–41)
Albumin: 4.2 g/dL (ref 3.5–5.0)
Alkaline Phosphatase: 53 U/L (ref 38–126)
Anion gap: 11 (ref 5–15)
BUN: 5 mg/dL — ABNORMAL LOW (ref 6–20)
CO2: 21 mmol/L — ABNORMAL LOW (ref 22–32)
Calcium: 9.6 mg/dL (ref 8.9–10.3)
Chloride: 109 mmol/L (ref 98–111)
Creatinine, Ser: 1.01 mg/dL — ABNORMAL HIGH (ref 0.44–1.00)
GFR calc Af Amer: >60 mL/min (ref >60)
GFR calc non Af Amer: >60 mL/min (ref >60)
Glucose, Bld: 108 mg/dL — ABNORMAL HIGH (ref 70–99)
Potassium: 3.3 mmol/L — ABNORMAL LOW (ref 3.5–5.1)
Sodium: 141 mmol/L (ref 135–145)
Total Bilirubin: 0.8 mg/dL (ref 0.3–1.2)
Total Protein: 6.2 g/dL — ABNORMAL LOW (ref 6.5–8.1)

## 2018-10-28 LAB — RAPID URINE DRUG SCREEN, HOSP PERFORMED
Amphetamines: NOT DETECTED
Barbiturates: NOT DETECTED
Benzodiazepines: POSITIVE — AB
Cocaine: NOT DETECTED
Opiates: NOT DETECTED
Tetrahydrocannabinol: NOT DETECTED

## 2018-10-28 LAB — CBC WITH DIFFERENTIAL/PLATELET
Abs Immature Granulocytes: 0.02 10*3/uL (ref 0.00–0.07)
Basophils Absolute: 0 10*3/uL (ref 0.0–0.1)
Basophils Relative: 1 %
Eosinophils Absolute: 0.1 10*3/uL (ref 0.0–0.5)
Eosinophils Relative: 1 %
HCT: 42 % (ref 36.0–46.0)
Hemoglobin: 14.5 g/dL (ref 12.0–15.0)
Immature Granulocytes: 0 %
Lymphocytes Relative: 22 %
Lymphs Abs: 1.8 10*3/uL (ref 0.7–4.0)
MCH: 32.2 pg (ref 26.0–34.0)
MCHC: 34.5 g/dL (ref 30.0–36.0)
MCV: 93.1 fL (ref 80.0–100.0)
Monocytes Absolute: 0.5 10*3/uL (ref 0.1–1.0)
Monocytes Relative: 6 %
Neutro Abs: 6.1 10*3/uL (ref 1.7–7.7)
Neutrophils Relative %: 70 %
Platelets: 203 10*3/uL (ref 150–400)
RBC: 4.51 MIL/uL (ref 3.87–5.11)
RDW: 12.6 % (ref 11.5–15.5)
WBC: 8.5 10*3/uL (ref 4.0–10.5)
nRBC: 0 % (ref 0.0–0.2)

## 2018-10-28 LAB — URINALYSIS, ROUTINE W REFLEX MICROSCOPIC
Bacteria, UA: NONE SEEN
Bilirubin Urine: NEGATIVE
Glucose, UA: NEGATIVE mg/dL
Ketones, ur: 5 mg/dL — AB
Leukocytes,Ua: NEGATIVE
Nitrite: NEGATIVE
Protein, ur: NEGATIVE mg/dL
Specific Gravity, Urine: 1.004 — ABNORMAL LOW (ref 1.005–1.030)
pH: 6 (ref 5.0–8.0)

## 2018-10-28 LAB — ETHANOL: Alcohol, Ethyl (B): <10 mg/dL (ref <10)

## 2018-10-28 NOTE — ED Notes (Addendum)
Pt ambulated to the restroom with Lavella Lemons, EMT. With no issues at this time. Pt is much more alert and oriented at this time.

## 2018-10-28 NOTE — ED Provider Notes (Addendum)
[facility] EMERGENCY DEPARTMENT Provider Note   CSN: [id number] Arrival date & time: 10/28/18  1537     History   Chief Complaint Chief Complaint  Patient presents with  . Altered Mental Status    HPI Crystal [name] is a 43 y.o. female.     The history is provided by the patient. No language interpreter was used.  Altered Mental Status Presenting symptoms: lethargy   Severity:  Severe Most recent episode:  Today Progression:  Improving Context: recent change in medication   Context: not alcohol use   Associated symptoms: abnormal movement and agitation   Associated symptoms: no suicidal behavior   Pt sleepy but arousable  on arrival.  Rn reports symptoms started after taking cymbalta.   Pt reports pain to hip and low back.  Burn from heating pad.  Past Medical History:  Diagnosis Date  . Anxiety   . Bipolar affective disorder, mixed ([place])   . Cancer ([place])   . Depression   . DJD (degenerative joint disease)    "BONE STICKS OUT ON LEFT PART OF LOWER BACK"  . Family history of anesthesia complication    MOTHER PONV  . Frequency of urination   . GERD (gastroesophageal reflux disease)    OCCASIONALLY TAKE ZANTAC  . History of breast cancer DX JUNE 2012 W/ RIGHT BREAST INVASIVE DUCTAL CA  STAGE IIB---  S/P BILATERAL MASTECTOMIES AND RIGHT NODE DISSECTION   ONCOLOGIST-- DR Jana Hakim--  CHEMO ENDED [place] 2012 / RADIATION ENDED MAR 2013--  CURRENT ON TAMOXIFEN AND ZOLADEX  . History of idiopathic seizure    DX 2008 --  LAST ONE 2012--  NO ISSUES SINCE AND NO MEDS. -- PT STATES DOCTORS FELT IT WAS STRESS RELATED DUE TO HEALTH ISSUES  . Hot flashes due to tamoxifen   . Hx MRSA infection 03/28/11   Skin  . Left ovarian cyst   . Nocturia   . Right lower quadrant abdominal pain   . Seizures ([place])    2016 last seizure   . Status post chemotherapy    docetaxel/carboplatin/trastuzumab  . Strains to urinate   . Urgency of urination     Patient Active  Problem List   Diagnosis Date Noted  . Osteonecrosis of hip ([place]) 06/13/2018  . Tibial plateau fracture, right, closed, initial encounter 11/27/2017  . Anorexia nervosa 08/19/2014  . Back pain 08/19/2014  . Malnutrition, calorie ([place]) 12/03/2013  . Migraines 05/21/2013  . Breast cancer of upper-outer quadrant of right female breast ([place]) 03/21/2013  . Chronic pain 01/09/2013  . DVT of upper extremity (deep vein thrombosis) ([place]) 01/02/2013  . Arm edema 12/27/2012  . Neuropathic pain 12/27/2012  . Chronic female pelvic pain 08/21/2012  . Fatigue 08/20/2012  . Chemotherapy induced cardiomyopathy ([place]) 09/08/2011  . Generalized convulsive seizure ([place]) 07/13/2011  . Seizure disorder ([place]) 07/13/2011  . Migraine 07/13/2011  . Anxiety disorder 07/13/2011  . Depression 07/13/2011  . Hx MRSA infection 04/13/2011    Past Surgical History:  Procedure Laterality Date  . BIOPSY BREAST  10/14/2010   right needle core biopsy  . BREAST SURGERY  11/16/2010   bilateral mastectomy+ right axillary node dissection,T1cN1a, Her2+,ERPR+  (right breast invasive ductal carcinoma)  . CYSTO WITH HYDRODISTENSION N/A 07/23/2012   Procedure: CYSTOSCOPY/HYDRODISTENSION instillation of marcaine and pyridium;  Surgeon: Reece Packer, MD;  Location: [facility];  Service: Urology;  Laterality: N/A;  INSTILLATION    . I&D EXTREMITY  09/26/2011   Procedure:  IRRIGATION AND DEBRIDEMENT EXTREMITY;  Surgeon: Tennis Must, MD;  Location: [facility];  Service: Orthopedics;  Laterality: Right;  I&D right hand cat bite wound  . ORIF TIBIA PLATEAU Right 11/27/2017   Procedure: OPEN REDUCTION INTERNAL FIXATION (ORIF) TIBIAL PLATEAU FRACTURE;  Surgeon: Gaynelle Arabian, MD;  Location: [facility];  Service: Orthopedics;  Laterality: Right;  . PORTACATH PLACEMENT  11/16/2010   placement of left subclavian port NOW REMOVED  . right knee torn meniscus surgery     . TOTAL HIP ARTHROPLASTY Left 06/13/2018   Procedure:  TOTAL HIP ARTHROPLASTY ANTERIOR APPROACH;  Surgeon: Gaynelle Arabian, MD;  Location: [facility];  Service: Orthopedics;  Laterality: Left;  115mn  . TRANSTHORACIC ECHOCARDIOGRAM  05-28-2012   LVF NORMAL/  EF 55-60%  . WISDOM TOOTH EXTRACTION       OB History    Gravida  1   Para  1   Term  1   Preterm  0   AB  0   Living  1     SAB  0   TAB  0   Ectopic  0   Multiple  0   Live Births               Home Medications    Prior to Admission medications   Medication Sig Start Date End Date Taking? Authorizing Provider  albuterol (PROVENTIL HFA;VENTOLIN HFA) 108 (90 Base) MCG/ACT inhaler Inhale 2 puffs into the lungs every 4 (four) hours as needed for wheezing or shortness of breath.  10/31/17   [provider]  ALPRAZolam (XANAX) 1 MG tablet TAKE 1 TABLET BY MOUTH 3 TIMES A DAY AS NEEDED FOR ANXIETY Patient taking differently: Take 1 mg by mouth 3 (three) times daily.  05/12/15   BEzekiel Slocumb PA-C  baclofen (LIORESAL) 10 MG tablet Take 10 mg by mouth 3 (three) times daily as needed for muscle spasms.    [provider]  diphenhydrAMINE-zinc acetate (BENADRYL EXTRA STRENGTH) cream Apply 1 application topically 3 (three) times daily as needed for itching. 07/05/18   [name], GVirgie Dad MD  DULoxetine (CYMBALTA) 30 MG capsule Take 30-60 mg by mouth See admin instructions. Take one capsule (30 mg) by mouth in the morning with food for one week, then increase to two capsules (60 mg) daily    [provider]  esomeprazole (NEXIUM) 20 MG packet Take 20 mg by mouth daily before breakfast.    [provider]  gabapentin (NEURONTIN) 600 MG tablet Take 600-1,200 mg by mouth See admin instructions. Take one tablet (600 mg) by mouth up to 5 times daily - start at one tablet (600 mg) three times daily and two tablets (1200 mg) at bedtime    [provider]  HYDROcodone-acetaminophen (NORCO/VICODIN) 5-325 MG tablet Take 1 tablet by mouth every 6 (six)  hours as needed for moderate pain.    [provider]  lamoTRIgine (LAMICTAL) 150 MG tablet Take 300 mg by mouth at bedtime. 11/13/17   [provider]  traZODone (DESYREL) 100 MG tablet Take 50-100 mg by mouth See admin instructions. Take 1/2 - 1 tablet (50 - 100 mg) by mouth 45 minutes before bedtime    [provider]    Family History Family History  Problem Relation Age of Onset  . Cancer Maternal Grandfather   . Mental illness Brother   . Mental illness Son     Social History Social History   Tobacco Use  . Smoking status:  Current Every Day Smoker    Packs/day: 1.00    Years: 20.00    Pack years: 20.00    Types: Cigarettes  . Smokeless tobacco: Never Used  Substance Use Topics  . Alcohol use: Yes    Comment: 15 beers per week ON AND OFF  . Drug use: No    Comment: LAST USED COCAINE AND MARIJIUANA 2011     Allergies   Tramadol, Phenytoin, Thorazine [chlorpromazine hcl], Ciprofloxacin, and Vancomycin   Review of Systems Review of Systems  Psychiatric/Behavioral: Positive for agitation. The patient is hyperactive.   All other systems reviewed and are negative.    Physical Exam Updated Vital Signs BP 139/72   Pulse 85   Temp 98.3 F (36.8 C) (Oral)   Resp 16   SpO2 100%   Physical Exam Vitals signs and nursing note reviewed.  Constitutional:      Appearance: She is well-developed.  HENT:     Head: Normocephalic.     Right Ear: Tympanic membrane normal.     Left Ear: Tympanic membrane normal.     Nose: Nose normal.     Mouth/Throat:     Mouth: Mucous membranes are moist.  Eyes:     Pupils: Pupils are equal, round, and reactive to light.  Neck:     Musculoskeletal: Normal range of motion.  Cardiovascular:     Rate and Rhythm: Normal rate.     Pulses: Normal pulses.     Heart sounds: Normal heart sounds.  Pulmonary:     Effort: Pulmonary effort is normal.  Abdominal:     General: Abdomen is flat. There is no distension.   Musculoskeletal: Normal range of motion.  Skin:    General: Skin is warm.     Comments: Erythema low back,  First degree burn  Less than 1% body space  Neurological:     General: No focal deficit present.     Mental Status: She is alert and oriented to person, place, and time.      ED Treatments / Results  Labs (all labs ordered are listed, but only abnormal results are displayed) Labs Reviewed  COMPREHENSIVE METABOLIC PANEL - Abnormal; Notable for the following components:      Result Value   Potassium 3.3 (*)    CO2 21 (*)    Glucose, Bld 108 (*)    BUN 5 (*)    Creatinine, Ser 1.01 (*)    Total Protein 6.2 (*)    All other components within normal limits  URINALYSIS, ROUTINE W REFLEX MICROSCOPIC - Abnormal; Notable for the following components:   Color, Urine STRAW (*)    Specific Gravity, Urine 1.004 (*)    Hgb urine dipstick SMALL (*)    Ketones, ur 5 (*)    All other components within normal limits  RAPID URINE DRUG SCREEN, HOSP PERFORMED - Abnormal; Notable for the following components:   Benzodiazepines POSITIVE (*)    All other components within normal limits  CBC WITH DIFFERENTIAL/PLATELET  ETHANOL    EKG EKG Interpretation  Date/Time:  Sunday October 28 2018 15:59:33 EDT Ventricular Rate:  76 PR Interval:    QRS Duration: 90 QT Interval:  386 QTC Calculation: 434 R Axis:   87 Text Interpretation:  Sinus rhythm Confirmed by Lennice Sites [phone]) on 10/28/2018 4:01:04 PM   Radiology Ct Head Wo Contrast  Result Date: 10/28/2018 CLINICAL DATA:  Altered mental status EXAM: CT HEAD WITHOUT CONTRAST TECHNIQUE: Contiguous axial images were obtained from  the base of the skull through the vertex without intravenous contrast. COMPARISON:  None. FINDINGS: Brain: No evidence of acute infarction, hemorrhage, hydrocephalus, extra-axial collection or mass lesion/mass effect. Vascular: No hyperdense vessel or unexpected calcification. Skull: Normal. Negative for fracture  or focal lesion. Sinuses/Orbits: No acute finding. Other: None. IMPRESSION: No acute intracranial pathology. Electronically Signed   By: Eddie Candle M.D.   On: 10/28/2018 16:57   Dg Chest Port 1 View  Result Date: 10/28/2018 CLINICAL DATA:  Altered mental status. EXAM: PORTABLE CHEST 1 VIEW COMPARISON:  06/19/2017 FINDINGS: The heart size and mediastinal contours are within normal limits. Both lungs are clear. The visualized skeletal structures are unremarkable. Bilateral mastectomies with right no dissection. IMPRESSION: No acute abnormalities. Electronically Signed   By: Lorriane Shire M.D.   On: 10/28/2018 16:42    Procedures Procedures (including critical care time)  Medications Ordered in ED Medications - No data to display   Initial Impression / Assessment and Plan / ED Course  I have reviewed the triage vital signs and the nursing notes.  Pertinent labs & imaging results that were available during my care of the patient were reviewed by me and considered in my medical decision making (see chart for details).        Dr. Ronnald Nian in to see and examine pt.  Pt awake and alert. Pt admits to drinking large amounts of caffeine.  (red bull)  Pt states she has been trying to keep herself awake but then can not sleep.  Pt reports her MD started her on trazodone to help her sleep.   Pt refuses TTS consult.  She is not suicidal or homicidal.  She is awake and alert/ Pt counseled on mania.  Pt advised to stop caffeine.  Pt agrees to contact her Psychiatrist tomorrow to discuss her symptoms and her mediations  Pt request medications for hip pain and burn from heating pad.  Pt advised to discuss mediations with her MD.    RN reports husband called and wanted information.  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    Fransico Meadow, PA-C 10/28/18 1952    Fransico Meadow, PA-C 10/28/18 2007    [address] DO 10/28/18 2055

## 2018-10-28 NOTE — ED Provider Notes (Signed)
Medical screening examination/treatment/procedure(s) were conducted as a shared visit with non-physician practitioner(s) and myself.  I personally evaluated the patient during the encounter. Briefly, the patient is a 43 y.o. female with history of bipolar disorder who presents to the ED with confusion, anxiety.  Recently started on new antidepressant.  Has been increasing her caffeine use.  Has not been sleeping or eating well.  Appears to be a little manic on exam.  Denies any hallucinations.  Denies any suicidal homicidal ideation.  Drug screen positive for benzos.  Lab work overall unremarkable.  No signs of infection.  Normal vitals.  No fever.  CT scan of the head normal.  Overall suspect medication side effect versus bipolar issue.  Offered her psychiatry consultation but she rather follow-up outpatient.  She appears safe to make this decision.  Does not appear to be any threat to herself or others.  Discharged in good condition.  Given return precautions.  This chart was dictated using voice recognition software.  Despite best efforts to proofread,  errors can occur which can change the documentation meaning.     EKG Interpretation  Date/Time:  Sunday October 28 2018 15:59:33 EDT Ventricular Rate:  76 PR Interval:    QRS Duration: 90 QT Interval:  386 QTC Calculation: 434 R Axis:   87 Text Interpretation:  Sinus rhythm Confirmed by Lennice Sites [phone]) on 10/28/2018 4:01:04 PM           Lennice Sites, DO 10/28/18 2035

## 2018-10-28 NOTE — ED Triage Notes (Signed)
Pt denies fall; hitting head or any loss of consciousness.

## 2018-10-28 NOTE — ED Notes (Signed)
Patient transported to CT 

## 2018-10-28 NOTE — ED Triage Notes (Signed)
Came in POV; stated having reaction to cymbalta.

## 2018-10-28 NOTE — Discharge Instructions (Signed)
Return for further evaluation if any problems.  Call your Psychiatrist to be seen tomorrow.  If symptoms worsen or persist you may need Psychiatric hospitalization to adjust your medication.

## 2018-10-28 NOTE — ED Notes (Signed)
Crystal [name] [phone]; updated.

## 2018-11-28 ENCOUNTER — Encounter (HOSPITAL_COMMUNITY): Payer: Self-pay

## 2018-11-28 NOTE — Progress Notes (Signed)
The following are in epic: EKG 10/29/2018 CXR 10/28/2018

## 2018-11-28 NOTE — Patient Instructions (Addendum)
DUE TO COVID-19 ONLY ONE VISITOR IS ALLOWED IN [employer]. THEY MAY ONLY WAIT IN THE WAITING ROOM     COVID SWAB TESTING MUST BE COMPLETED ON: Today, November 29, 2018 immediately after pre op appointment.   [address], [place] [facility] enter pre surgical testing line (Must self quarantine after testing. Follow instructions on handout.)              Your procedure is scheduled on: Monday, Aug. 3, 2020   Report to [facility] Main  Entrance    Report to admitting at 11:45 AM   Call this number if you have problems the morning of surgery [phone]   Do not eat food:After Midnight.    May have liquids until 10:45Am day of surgery   Complete one Ensure drink the morning of surgery at 10:45AM the day of surgery.    CLEAR LIQUID DIET  Foods Allowed                                                                     Foods Excluded  Water, Black Coffee and tea, regular and decaf                             liquids that you cannot  Plain Jell-O in any flavor  (No red)                                           see through such as: Fruit ices (not with fruit pulp)                                     milk, soups, orange juice  Iced Popsicles (No red)                                    All solid food Carbonated beverages, regular and diet                                    Apple juices Sports drinks like Gatorade (No red) Lightly seasoned clear broth or consume(fat free) Sugar, honey syrup  Sample Menu Breakfast                                Lunch                                     Supper Cranberry juice                    Beef broth  Chicken broth Jell-O                                     Grape juice                           Apple juice Coffee or tea                        Jell-O                                      Popsicle                                                Coffee or tea                         Coffee or tea     Brush your teeth the morning of surgery.   Do NOT smoke after 24 hours before surgery    Take these medicines the morning of surgery with A SIP OF WATER: Xanax, Esomeprazole (Nexium), Gabapentin                               You may not have any metal on your body including hair pins, jewelry, and body piercings             Do not wear make-up, lotions, powders, perfumes/cologne, or deodorant             Do not wear nail polish.  Do not shave  48 hours prior to surgery.                Do not bring valuables to the hospital. [place].   Contacts, dentures or bridgework may not be worn into surgery.   Patients discharged the day of surgery will not be allowed to drive home.                Please read over the following fact sheets you were given:  [facility] - Preparing for Surgery Before surgery, you can play an important role.  Because skin is not sterile, your skin needs to be as free of germs as possible.  You can reduce the number of germs on your skin by washing with CHG (chlorahexidine gluconate) soap before surgery.  CHG is an antiseptic cleaner which kills germs and bonds with the skin to continue killing germs even after washing. Please DO NOT use if you have an allergy to CHG or antibacterial soaps.  If your skin becomes reddened/irritated stop using the CHG and inform your nurse when you arrive at Short Stay. Do not shave (including legs and underarms) for at least 48 hours prior to the first CHG shower.  You may shave your face/neck.  Please follow these instructions carefully:  1.  Shower with CHG Soap the night before surgery and the  morning of surgery.  2.  If you choose  to wash your hair, wash your hair first as usual with your normal  shampoo.  3.  After you shampoo, rinse your hair and body thoroughly to remove the shampoo.                             4.  Use CHG as you would any other liquid  soap.  You can apply chg directly to the skin and wash.  Gently with a scrungie or clean washcloth.  5.  Apply the CHG Soap to your body ONLY FROM THE NECK DOWN.   Do  not use on face/ open                           Wound or open sores. Avoid contact with eyes, ears mouth and genitals (private parts).                       Wash face,  Genitals (private parts) with your normal soap.             6.  Wash thoroughly, paying special attention to the area where your surgery  will be performed.  7.  Thoroughly rinse your body with warm water from the neck down.  8.  DO NOT shower/wash with your normal soap after using and rinsing off the CHG Soap.                9.  Pat yourself dry with a clean towel.            10.  Wear clean pajamas.            11.  Place clean sheets on your bed the night of your first shower and do not  sleep with pets. Day of Surgery : Do not apply any lotions/deodorants the morning of surgery.  Please wear clean clothes to the hospital/surgery center.  FAILURE TO FOLLOW THESE INSTRUCTIONS MAY RESULT IN THE CANCELLATION OF YOUR SURGERY  PATIENT SIGNATURE_________________________________  NURSE SIGNATURE__________________________________  ________________________________________________________________________

## 2018-11-29 ENCOUNTER — Other Ambulatory Visit: Payer: Self-pay

## 2018-11-29 ENCOUNTER — Other Ambulatory Visit (HOSPITAL_COMMUNITY)
Admission: RE | Admit: 2018-11-29 | Discharge: 2018-11-29 | Disposition: A | Discharge status: Home / Self Care | Payer: Medicare Other | Source: Ambulatory Visit | Attending: Orthopedic Surgery | Admitting: Orthopedic Surgery

## 2018-11-29 ENCOUNTER — Encounter (HOSPITAL_COMMUNITY)
Admission: RE | Admit: 2018-11-29 | Discharge: 2018-11-29 | Disposition: A | Discharge status: Home / Self Care | Payer: Medicare Other | Source: Ambulatory Visit | Attending: Orthopedic Surgery | Admitting: Orthopedic Surgery

## 2018-11-29 ENCOUNTER — Encounter (HOSPITAL_COMMUNITY): Payer: Self-pay

## 2018-11-29 DIAGNOSIS — Z01812 Encounter for preprocedural laboratory examination: Secondary | ICD-10-CM | POA: Insufficient documentation

## 2018-11-29 DIAGNOSIS — M25561 Pain in right knee: Secondary | ICD-10-CM | POA: Diagnosis not present

## 2018-11-29 DIAGNOSIS — Z20828 Contact with and (suspected) exposure to other viral communicable diseases: Secondary | ICD-10-CM | POA: Insufficient documentation

## 2018-11-29 DIAGNOSIS — T84092A Other mechanical complication of internal right knee prosthesis, initial encounter: Secondary | ICD-10-CM | POA: Diagnosis not present

## 2018-11-29 HISTORY — DX: Alcohol abuse, uncomplicated: F10.10

## 2018-11-29 HISTORY — DX: Other chronic pain: G89.29

## 2018-11-29 HISTORY — DX: Cardiomyopathy due to drug and external agent: I42.7

## 2018-11-29 HISTORY — DX: Anorexia nervosa, unspecified: F50.00

## 2018-11-29 HISTORY — DX: Polyneuropathy, unspecified: G62.9

## 2018-11-29 HISTORY — DX: Localized edema: R60.0

## 2018-11-29 HISTORY — DX: Migraine, unspecified, not intractable, without status migrainosus: G43.909

## 2018-11-29 HISTORY — DX: Idiopathic aseptic necrosis of unspecified femur: M87.059

## 2018-11-29 LAB — CBC
HCT: 46.2 % — ABNORMAL HIGH (ref 36.0–46.0)
Hemoglobin: 15.6 g/dL — ABNORMAL HIGH (ref 12.0–15.0)
MCH: 33.2 pg (ref 26.0–34.0)
MCHC: 33.8 g/dL (ref 30.0–36.0)
MCV: 98.3 fL (ref 80.0–100.0)
Platelets: 208 10*3/uL (ref 150–400)
RBC: 4.7 MIL/uL (ref 3.87–5.11)
RDW: 12.3 % (ref 11.5–15.5)
WBC: 8.4 10*3/uL (ref 4.0–10.5)
nRBC: 0 % (ref 0.0–0.2)

## 2018-11-29 LAB — SURGICAL PCR SCREEN
MRSA, PCR: NEGATIVE
Staphylococcus aureus: NEGATIVE

## 2018-11-29 LAB — SARS CORONAVIRUS 2 (TAT 6-24 HRS): SARS Coronavirus 2: NEGATIVE

## 2018-11-29 MED ORDER — ENSURE PRE-SURGERY PO LIQD
296.0000 mL | Freq: Once | ORAL | Status: DC
  Filled 2018-11-29: qty 296

## 2018-11-30 NOTE — Progress Notes (Signed)
Anesthesia Chart Review   Case: [id number] Date/Time: 12/03/18 1330   Procedure: Right tibia screw removal (Right ) - 62mn   Anesthesia type: Choice   Pre-op diagnosis: painful hardware right tibia   [facility]: [facility] 09 / [facility]   Surgeon: AGaynelle Arabian MD      DISCUSSION:43 y.o. current every day smoker (20 pack years) with h/o GERD, idiopathic seizures (last seizure 2016), depression, anxiety, bipolar effective disorder, h/o anorexia nervosa, right breast cancer s/p bilateral mastectomies and chemo 2012, DVT of upper extremity 2014, alcohol abuse, painful hardware to right tibia s/p ORIF 11/27/17 scheduled for above procedure 12/03/2018 with Dr. AWynelle Link   S/p total hip arthroplasty left hip 06/13/2018 with spinal, no anesthesia complications noted.   Pt seen in ED 10/28/2018 due to altered mental status.  Per ED note pt had been increasing caffiene use, recently started on new antidepressant, appeared to be in manic state.  Per Dr. ALennice Sites "Has not been sleeping or eating well.  Appears to be a little manic on exam.  Denies any hallucinations.  Denies any suicidal homicidal ideation.  Drug screen positive for benzos.  Lab work overall unremarkable.  No signs of infection.  Normal vitals.  No fever.  CT scan of the head normal.  Overall suspect medication side effect versus bipolar issue.  Offered her psychiatry consultation but she rather follow-up outpatient.  She appears safe to make this decision.  Does not appear to be any threat to herself or others.  Discharged in good condition."  Pt stable at PAT visit, awake, alert, oriented, speech appropriate.   Anticipate pt can proceed with planned procedure barring acute status change.    VS: BP 109/81   Pulse 89   Temp 37.3 C (Oral)   Resp 18   Ht '5\' 4"'  (1.626 m)   Wt 49.9 kg   LMP 11/04/2018   SpO2 99%   BMI 18.86 kg/m   PROVIDERS: KElenore Paddy FNP is PCP   BGlori Bickers MD is Cardiologist  LABS: Labs reviewed:  Acceptable for surgery. (all labs ordered are listed, but only abnormal results are displayed)  Labs Reviewed  CBC - Abnormal; Notable for the following components:      Result Value   Hemoglobin 15.6 (*)    HCT 46.2 (*)    All other components within normal limits  SURGICAL PCR SCREEN     IMAGES:   EKG: 10/29/2018 Rate 76 bpm Sinus rhythm   CV:  Past Medical History:  Diagnosis Date  . Alcohol abuse   . Anorexia nervosa   . Anxiety   . Avascular necrosis of bone of hip ([place]   . Bipolar affective disorder, mixed ([place]   . Cancer ([place]   . Chronic pain   . Depression   . Dilated cardiomyopathy secondary to drug ([place]   . DJD (degenerative joint disease)    "BONE STICKS OUT ON LEFT PART OF LOWER BACK"  . DVT of upper extremity (deep vein thrombosis) ([place] 2014  . Edema of upper extremity   . Family history of anesthesia complication    MOTHER PONV  . Frequency of urination   . GERD (gastroesophageal reflux disease)    OCCASIONALLY TAKE ZANTAC  . History of breast cancer DX JUNE 2012 W/ RIGHT BREAST INVASIVE DUCTAL CA  STAGE IIB---  S/P BILATERAL MASTECTOMIES AND RIGHT NODE DISSECTION   ONCOLOGIST-- DR MJana Hakim-  CHEMO ENDED [place] / RADIATION ENDED MAR 2013--  CURRENT ON TAMOXIFEN AND  ZOLADEX  . History of idiopathic seizure    DX 2008 --  LAST ONE 2012--  NO ISSUES SINCE AND NO MEDS. -- PT STATES DOCTORS FELT IT WAS STRESS RELATED DUE TO HEALTH ISSUES  . Hot flashes due to tamoxifen   . Hx MRSA infection 03/28/11   Skin  . Left ovarian cyst   . Migraines   . Neuropathy   . Nocturia   . Right lower quadrant abdominal pain   . Seizures ([place])    2016 last seizure   . Status post chemotherapy    docetaxel/carboplatin/trastuzumab  . Strains to urinate   . Urgency of urination     Past Surgical History:  Procedure Laterality Date  . BIOPSY BREAST  10/14/2010   right needle core biopsy  . BREAST SURGERY  11/16/2010   bilateral mastectomy+ right axillary  node dissection,T1cN1a, Her2+,ERPR+  (right breast invasive ductal carcinoma)  . CYSTO WITH HYDRODISTENSION N/A 07/23/2012   Procedure: CYSTOSCOPY/HYDRODISTENSION instillation of marcaine and pyridium;  Surgeon: Reece Packer, MD;  Location: [facility];  Service: Urology;  Laterality: N/A;  INSTILLATION    . I&D EXTREMITY  09/26/2011   Procedure: IRRIGATION AND DEBRIDEMENT EXTREMITY;  Surgeon: Tennis Must, MD;  [facility];  Service: Orthopedics;  Laterality: Right;  I&D right hand cat bite wound  . ORIF TIBIA PLATEAU Right 11/27/2017   Procedure: OPEN REDUCTION INTERNAL FIXATION (ORIF) TIBIAL PLATEAU FRACTURE;  Surgeon: Gaynelle Arabian, MD;  [facility];  Service: Orthopedics;  Laterality: Right;  . PORTACATH PLACEMENT  11/16/2010   placement of left subclavian port NOW REMOVED  . right knee torn meniscus surgery     . TOTAL HIP ARTHROPLASTY Left 06/13/2018   Procedure: TOTAL HIP ARTHROPLASTY ANTERIOR APPROACH;  Surgeon: Gaynelle Arabian, MD;  [facility];  Service: Orthopedics;  Laterality: Left;  141mn  . TRANSTHORACIC ECHOCARDIOGRAM  05-28-2012   LVF NORMAL/  EF 55-60%  . WISDOM TOOTH EXTRACTION      MEDICATIONS: . albuterol (PROVENTIL HFA;VENTOLIN HFA) 108 (90 Base) MCG/ACT inhaler  . ALPRAZolam (XANAX) 1 MG tablet  . diphenhydrAMINE-zinc acetate (BENADRYL EXTRA STRENGTH) cream  . esomeprazole (NEXIUM) 20 MG capsule  . gabapentin (NEURONTIN) 600 MG tablet  . traZODone (DESYREL) 100 MG tablet   No current facility-administered medications for this encounter.    .Marland Kitchengoserelin (ZOLADEX) injection 10.8 mg    JMaia PlanWSt. Mary Medical CenterPre-Surgical Testing ([phone]/31/20  10:14 AM

## 2018-11-30 NOTE — Anesthesia Preprocedure Evaluation (Addendum)
Anesthesia Evaluation  Patient identified by MRN, date of birth, ID band Patient awake    Reviewed: Allergy & Precautions, NPO status , Patient's Chart, lab work & pertinent test results  Airway Mallampati: II  TM Distance: >3 FB Neck ROM: Full    Dental no notable dental hx.    Pulmonary Current Smoker,    Pulmonary exam normal breath sounds clear to auscultation       Cardiovascular negative cardio ROS Normal cardiovascular exam Rhythm:Regular Rate:Normal     Neuro/Psych Bipolar Disorder    GI/Hepatic Neg liver ROS, GERD  Medicated,  Endo/Other  negative endocrine ROS  Renal/GU negative Renal ROS  negative genitourinary   Musculoskeletal negative musculoskeletal ROS (+)   Abdominal   Peds negative pediatric ROS (+)  Hematology negative hematology ROS (+)   Anesthesia Other Findings   Reproductive/Obstetrics negative OB ROS                           Anesthesia Physical Anesthesia Plan  ASA: II  Anesthesia Plan: General   Post-op Pain Management:    Induction: Intravenous  PONV Risk Score and Plan: 2 and Ondansetron, Dexamethasone and Treatment may vary due to age or medical condition  Airway Management Planned: LMA  Additional Equipment:   Intra-op Plan:   Post-operative Plan: Extubation in OR  Informed Consent: I have reviewed the patients History and Physical, chart, labs and discussed the procedure including the risks, benefits and alternatives for the proposed anesthesia with the patient or authorized representative who has indicated his/her understanding and acceptance.     Dental advisory given  Plan Discussed with: CRNA and Surgeon  Anesthesia Plan Comments: (See PAT note 11/29/2018, Konrad Felix, PA-C)       Anesthesia Quick Evaluation

## 2018-12-03 ENCOUNTER — Ambulatory Visit (HOSPITAL_COMMUNITY): Payer: Medicare Other | Admitting: Physician Assistant

## 2018-12-03 ENCOUNTER — Encounter (HOSPITAL_COMMUNITY)
Admission: RE | Disposition: A | Discharge status: Home / Self Care | Payer: Self-pay | Source: Other Acute Inpatient Hospital | Attending: Orthopedic Surgery

## 2018-12-03 ENCOUNTER — Ambulatory Visit (HOSPITAL_COMMUNITY): Payer: Medicare Other | Admitting: Certified Registered Nurse Anesthetist

## 2018-12-03 ENCOUNTER — Ambulatory Visit (HOSPITAL_COMMUNITY)
Admission: RE | Admit: 2018-12-03 | Discharge: 2018-12-03 | Disposition: A | Discharge status: Home / Self Care | Payer: Medicare Other | Source: Other Acute Inpatient Hospital | Attending: Orthopedic Surgery | Admitting: Orthopedic Surgery

## 2018-12-03 ENCOUNTER — Encounter (HOSPITAL_COMMUNITY): Payer: Self-pay | Admitting: *Deleted

## 2018-12-03 DIAGNOSIS — Z96642 Presence of left artificial hip joint: Secondary | ICD-10-CM | POA: Insufficient documentation

## 2018-12-03 DIAGNOSIS — G629 Polyneuropathy, unspecified: Secondary | ICD-10-CM | POA: Insufficient documentation

## 2018-12-03 DIAGNOSIS — Y831 Surgical operation with implant of artificial internal device as the cause of abnormal reaction of the patient, or of later complication, without mention of misadventure at the time of the procedure: Secondary | ICD-10-CM | POA: Diagnosis not present

## 2018-12-03 DIAGNOSIS — I420 Dilated cardiomyopathy: Secondary | ICD-10-CM | POA: Insufficient documentation

## 2018-12-03 DIAGNOSIS — Z79899 Other long term (current) drug therapy: Secondary | ICD-10-CM | POA: Insufficient documentation

## 2018-12-03 DIAGNOSIS — F17200 Nicotine dependence, unspecified, uncomplicated: Secondary | ICD-10-CM | POA: Diagnosis not present

## 2018-12-03 DIAGNOSIS — K219 Gastro-esophageal reflux disease without esophagitis: Secondary | ICD-10-CM | POA: Insufficient documentation

## 2018-12-03 DIAGNOSIS — Z9221 Personal history of antineoplastic chemotherapy: Secondary | ICD-10-CM | POA: Diagnosis not present

## 2018-12-03 DIAGNOSIS — T8484XA Pain due to internal orthopedic prosthetic devices, implants and grafts, initial encounter: Secondary | ICD-10-CM | POA: Diagnosis not present

## 2018-12-03 DIAGNOSIS — Z853 Personal history of malignant neoplasm of breast: Secondary | ICD-10-CM | POA: Diagnosis not present

## 2018-12-03 DIAGNOSIS — F419 Anxiety disorder, unspecified: Secondary | ICD-10-CM | POA: Diagnosis not present

## 2018-12-03 DIAGNOSIS — F3189 Other bipolar disorder: Secondary | ICD-10-CM | POA: Diagnosis not present

## 2018-12-03 HISTORY — PX: HARDWARE REMOVAL: SHX979

## 2018-12-03 SURGERY — REMOVAL, HARDWARE
Anesthesia: General | Laterality: Right

## 2018-12-03 MED ORDER — DEXAMETHASONE SODIUM PHOSPHATE 10 MG/ML IJ SOLN
INTRAMUSCULAR | Status: DC | PRN
  Administered 2018-12-03: 10 mg via INTRAVENOUS

## 2018-12-03 MED ORDER — BUPIVACAINE-EPINEPHRINE (PF) 0.25% -1:200000 IJ SOLN
INTRAMUSCULAR | Status: AC
  Filled 2018-12-03: qty 30

## 2018-12-03 MED ORDER — FENTANYL CITRATE (PF) 100 MCG/2ML IJ SOLN
INTRAMUSCULAR | Status: AC
  Filled 2018-12-03: qty 2

## 2018-12-03 MED ORDER — LIDOCAINE 2% (20 MG/ML) 5 ML SYRINGE
INTRAMUSCULAR | Status: DC | PRN
  Administered 2018-12-03: 50 mg via INTRAVENOUS

## 2018-12-03 MED ORDER — DEXMEDETOMIDINE HCL 200 MCG/2ML IV SOLN
INTRAVENOUS | Status: DC | PRN
  Administered 2018-12-03: 12 ug via INTRAVENOUS
  Administered 2018-12-03: 8 ug via INTRAVENOUS

## 2018-12-03 MED ORDER — SODIUM CHLORIDE 0.9 % IR SOLN
Status: DC | PRN
  Administered 2018-12-03: 1000 mL

## 2018-12-03 MED ORDER — BUPIVACAINE-EPINEPHRINE 0.25% -1:200000 IJ SOLN
INTRAMUSCULAR | Status: DC | PRN
  Administered 2018-12-03: 10 mL

## 2018-12-03 MED ORDER — ONDANSETRON HCL 4 MG/2ML IJ SOLN
INTRAMUSCULAR | Status: DC | PRN
  Administered 2018-12-03: 4 mg via INTRAVENOUS

## 2018-12-03 MED ORDER — ACETAMINOPHEN 10 MG/ML IV SOLN
1000.0000 mg | Freq: Once | INTRAVENOUS | Status: AC
  Administered 2018-12-03: 15:00:00 1000 mg via INTRAVENOUS
  Filled 2018-12-03: qty 100

## 2018-12-03 MED ORDER — HYDROCODONE-ACETAMINOPHEN 5-325 MG PO TABS: 1.0000 | ORAL_TABLET | Freq: Four times a day (QID) | ORAL | 0 refills | Status: DC | PRN

## 2018-12-03 MED ORDER — FENTANYL CITRATE (PF) 100 MCG/2ML IJ SOLN
INTRAMUSCULAR | Status: DC | PRN
  Administered 2018-12-03 (×2): 50 ug via INTRAVENOUS

## 2018-12-03 MED ORDER — PROPOFOL 10 MG/ML IV BOLUS
INTRAVENOUS | Status: DC | PRN
  Administered 2018-12-03: 140 mg via INTRAVENOUS

## 2018-12-03 MED ORDER — MIDAZOLAM HCL 2 MG/2ML IJ SOLN
INTRAMUSCULAR | Status: AC
  Filled 2018-12-03: qty 2

## 2018-12-03 MED ORDER — KETOROLAC TROMETHAMINE 30 MG/ML IJ SOLN
30.0000 mg | Freq: Once | INTRAMUSCULAR | Status: AC | PRN
  Administered 2018-12-03: 30 mg via INTRAVENOUS

## 2018-12-03 MED ORDER — LIDOCAINE 2% (20 MG/ML) 5 ML SYRINGE
INTRAMUSCULAR | Status: AC
  Filled 2018-12-03: qty 5

## 2018-12-03 MED ORDER — LACTATED RINGERS IV SOLN
INTRAVENOUS | Status: DC
  Administered 2018-12-03 (×2): via INTRAVENOUS

## 2018-12-03 MED ORDER — CEFAZOLIN SODIUM-DEXTROSE 2-4 GM/100ML-% IV SOLN
2.0000 g | INTRAVENOUS | Status: AC
  Administered 2018-12-03: 15:00:00 2 g via INTRAVENOUS
  Filled 2018-12-03: qty 100

## 2018-12-03 MED ORDER — OXYCODONE HCL 5 MG PO TABS
5.0000 mg | ORAL_TABLET | Freq: Once | ORAL | Status: AC | PRN
  Administered 2018-12-03: 5 mg via ORAL

## 2018-12-03 MED ORDER — KETOROLAC TROMETHAMINE 30 MG/ML IJ SOLN
INTRAMUSCULAR | Status: AC
  Filled 2018-12-03: qty 1

## 2018-12-03 MED ORDER — PROMETHAZINE HCL 25 MG/ML IJ SOLN: 6.2500 mg | INTRAMUSCULAR | Status: DC | PRN

## 2018-12-03 MED ORDER — HYDROMORPHONE HCL 1 MG/ML IJ SOLN
0.2500 mg | INTRAMUSCULAR | Status: DC | PRN
  Administered 2018-12-03 (×3): 0.5 mg via INTRAVENOUS

## 2018-12-03 MED ORDER — MIDAZOLAM HCL 5 MG/5ML IJ SOLN
INTRAMUSCULAR | Status: DC | PRN
  Administered 2018-12-03: 2 mg via INTRAVENOUS

## 2018-12-03 MED ORDER — PROPOFOL 10 MG/ML IV BOLUS
INTRAVENOUS | Status: AC
  Filled 2018-12-03: qty 20

## 2018-12-03 MED ORDER — HYDROMORPHONE HCL 1 MG/ML IJ SOLN
INTRAMUSCULAR | Status: AC
  Filled 2018-12-03: qty 1

## 2018-12-03 MED ORDER — POVIDONE-IODINE 10 % EX SWAB
2.0000 "application " | Freq: Once | CUTANEOUS | Status: AC
  Administered 2018-12-03: 2 via TOPICAL

## 2018-12-03 MED ORDER — OXYCODONE HCL 5 MG PO TABS
ORAL_TABLET | ORAL | Status: AC
  Administered 2018-12-03: 5 mg via ORAL
  Filled 2018-12-03: qty 1

## 2018-12-03 MED ORDER — CHLORHEXIDINE GLUCONATE 4 % EX LIQD: 60.0000 mL | Freq: Once | CUTANEOUS | Status: DC

## 2018-12-03 MED ORDER — PHENYLEPHRINE HCL (PRESSORS) 10 MG/ML IV SOLN
INTRAVENOUS | Status: DC | PRN
  Administered 2018-12-03: 120 ug via INTRAVENOUS

## 2018-12-03 SURGICAL SUPPLY — 48 items
BANDAGE ESMARK 6X9 LF (GAUZE/BANDAGES/DRESSINGS) ×1 IMPLANT
BNDG CMPR 9X6 STRL LF SNTH (GAUZE/BANDAGES/DRESSINGS) ×1
BNDG ELASTIC 6X5.8 VLCR STR LF (GAUZE/BANDAGES/DRESSINGS) ×2 IMPLANT
BNDG ESMARK 6X9 LF (GAUZE/BANDAGES/DRESSINGS) ×2
COVER SURGICAL LIGHT HANDLE (MISCELLANEOUS) ×2 IMPLANT
COVER WAND RF STERILE (DRAPES) IMPLANT
CUFF TOURN SGL QUICK 18X4 (TOURNIQUET CUFF) IMPLANT
CUFF TOURN SGL QUICK 34 (TOURNIQUET CUFF) ×1
CUFF TRNQT CYL 34X4.125X (TOURNIQUET CUFF) IMPLANT
DRAPE C-ARM 42X120 X-RAY (DRAPES) ×1 IMPLANT
DRAPE C-ARMOR (DRAPES) ×1 IMPLANT
DRAPE EXTREMITY T 121X128X90 (DISPOSABLE) ×1 IMPLANT
DRAPE INCISE IOBAN 66X45 STRL (DRAPES) ×2 IMPLANT
DRAPE ORTHO SPLIT 77X108 STRL (DRAPES)
DRAPE SHEET LG 3/4 BI-LAMINATE (DRAPES) ×2 IMPLANT
DRAPE SURG ORHT 6 SPLT 77X108 (DRAPES) IMPLANT
DRSG ADAPTIC 3X8 NADH LF (GAUZE/BANDAGES/DRESSINGS) ×2 IMPLANT
DRSG PAD ABDOMINAL 8X10 ST (GAUZE/BANDAGES/DRESSINGS) ×2 IMPLANT
DURAPREP 26ML APPLICATOR (WOUND CARE) ×2 IMPLANT
ELECT REM PT RETURN 15FT ADLT (MISCELLANEOUS) ×2 IMPLANT
GAUZE SPONGE 4X4 12PLY STRL (GAUZE/BANDAGES/DRESSINGS) ×2 IMPLANT
GLOVE BIO SURGEON STRL SZ7.5 (GLOVE) ×2 IMPLANT
GLOVE BIO SURGEON STRL SZ8 (GLOVE) ×4 IMPLANT
GLOVE BIOGEL PI IND STRL 7.0 (GLOVE) ×1 IMPLANT
GLOVE BIOGEL PI IND STRL 8 (GLOVE) ×3 IMPLANT
GLOVE BIOGEL PI INDICATOR 7.0 (GLOVE) ×1
GLOVE BIOGEL PI INDICATOR 8 (GLOVE) ×3
GLOVE SURG SS PI 7.0 STRL IVOR (GLOVE) ×2 IMPLANT
GOWN STRL REUS W/TWL LRG LVL3 (GOWN DISPOSABLE) ×6 IMPLANT
KIT BASIN OR (CUSTOM PROCEDURE TRAY) ×2 IMPLANT
KIT TURNOVER KIT A (KITS) IMPLANT
MANIFOLD NEPTUNE II (INSTRUMENTS) ×2 IMPLANT
NEEDLE HYPO 22GX1.5 SAFETY (NEEDLE) ×1 IMPLANT
NS IRRIG 1000ML POUR BTL (IV SOLUTION) ×2 IMPLANT
PACK TOTAL JOINT (CUSTOM PROCEDURE TRAY) ×2 IMPLANT
PADDING CAST COTTON 6X4 STRL (CAST SUPPLIES) ×3 IMPLANT
PROTECTOR NERVE ULNAR (MISCELLANEOUS) ×2 IMPLANT
STAPLER VISISTAT 35W (STAPLE) IMPLANT
STRIP CLOSURE SKIN 1/2X4 (GAUZE/BANDAGES/DRESSINGS) ×2 IMPLANT
SUT ETHILON 4 0 PS 2 18 (SUTURE) ×1 IMPLANT
SUT MNCRL AB 4-0 PS2 18 (SUTURE) ×2 IMPLANT
SUT VIC AB 0 CT1 36 (SUTURE) ×4 IMPLANT
SUT VIC AB 2-0 CT1 27 (SUTURE) ×2
SUT VIC AB 2-0 CT1 TAPERPNT 27 (SUTURE) ×2 IMPLANT
SYR CONTROL 10ML LL (SYRINGE) ×1 IMPLANT
TOWEL OR 17X26 10 PK STRL BLUE (TOWEL DISPOSABLE) ×4 IMPLANT
UNDERPAD 30X30 (UNDERPADS AND DIAPERS) ×2 IMPLANT
WATER STERILE IRR 1000ML POUR (IV SOLUTION) ×2 IMPLANT

## 2018-12-03 NOTE — Anesthesia Postprocedure Evaluation (Signed)
Anesthesia Post Note  Patient: Crystal [name]  Procedure(s) Performed: Right tibia screw removal (Right )     Patient location during evaluation: PACU Anesthesia Type: General Level of consciousness: awake and alert Pain management: pain level controlled Vital Signs Assessment: post-procedure vital signs reviewed and stable Respiratory status: spontaneous breathing, nonlabored ventilation, respiratory function stable and patient connected to nasal cannula oxygen Cardiovascular status: blood pressure returned to baseline and stable Postop Assessment: no apparent nausea or vomiting Anesthetic complications: no    Last Vitals:  Vitals:   12/03/18 1545 12/03/18 1600  BP: (!) 108/59 99/72  Pulse: 73 65  Resp: 20 13  Temp:    SpO2: 100% 100%    Last Pain:  Vitals:   12/03/18 1600  TempSrc:   PainSc: 8                  Duwane Gewirtz S

## 2018-12-03 NOTE — Discharge Instructions (Addendum)
Dr. Gaynelle Arabian Total Joint Specialist [facility] [address], [place], [place] [address] [phone]  [place]   Remove items at home which could result in a fall. This includes throw rugs or furniture in walking pathways.   ICE to the affected knee every three hours for 30 minutes at a time and then as needed for pain and swelling.  Continue to use ice on the knee for pain and swelling from surgery. You may notice swelling that will progress down to the foot and ankle.  This is normal after surgery.  Elevate the leg when you are not up walking on it.    Continue to use the breathing machine which will help keep your temperature down.  It is common for your temperature to cycle up and down following surgery, especially at night when you are not up moving around and exerting yourself.  The breathing machine keeps your lungs expanded and your temperature down.  Do not place pillow under knee, focus on keeping the knee straight while resting  DIET You may resume your previous home diet once your are discharged from the hospital.  DRESSING / WOUND CARE / SHOWERING You may change your dressing 3-5 days after surgery.  Then change the dressing every day with sterile gauze.  Please use good hand washing techniques before changing the dressing.  Do not use any lotions or creams on the incision until instructed by your surgeon. You may start showering once you are discharged home but do not submerge the incision under water. Just pat the incision dry and apply a dry gauze dressing on daily. Change the surgical dressing daily and reapply a dry dressing each time.  WEIGHT BEARING Weight bearing as tolerated with assist device (walker, cane, etc) as directed, use it as long as suggested by your surgeon or therapist, typically at least 4-6 weeks.  POSTOPERATIVE CONSTIPATION PROTOCOL Constipation - defined medically as fewer than  three stools per week and severe constipation as less than one stool per week.  One of the most common issues patients have following surgery is constipation.  Even if you have a regular bowel pattern at home, your normal regimen is likely to be disrupted due to multiple reasons following surgery.  Combination of anesthesia, postoperative narcotics, change in appetite and fluid intake all can affect your bowels.  In order to avoid complications following surgery, here are some recommendations in order to help you during your recovery period.  Colace (docusate) - Pick up an over-the-counter form of Colace or another stool softener and take twice a day as long as you are requiring postoperative pain medications.  Take with a full glass of water daily.  If you experience loose stools or diarrhea, hold the colace until you stool forms back up.  If your symptoms do not get better within 1 week or if they get worse, check with your doctor.  Dulcolax (bisacodyl) - Pick up over-the-counter and take as directed by the product packaging as needed to assist with the movement of your bowels.  Take with a full glass of water.  Use this product as needed if not relieved by Colace only.   MiraLax (polyethylene glycol) - Pick up over-the-counter to have on hand.  MiraLax is a solution that will increase the amount of water in your bowels to assist with bowel movements.  Take as directed and can mix with a glass of water, juice, soda, coffee, or  tea.  Take if you go more than two days without a movement. Do not use MiraLax more than once per day. Call your doctor if you are still constipated or irregular after using this medication for 7 days in a row.  If you continue to have problems with postoperative constipation, please contact the office for further assistance and recommendations.  If you experience "the worst abdominal pain ever" or develop nausea or vomiting, please contact the office immediatly for further  recommendations for treatment.  ITCHING If you experience itching with your medications, try taking only a single pain pill, or even half a pain pill at a time.  You can also use Benadryl over the counter for itching or also to help with sleep.   TED HOSE STOCKINGS Wear the elastic stockings on both legs for three weeks following surgery during the day but you may remove then at night for sleeping.  MEDICATIONS See your medication summary on the After Visit Summary that the nursing staff will review with you prior to discharge.  You may have some home medications which will be placed on hold until you complete the course of blood thinner medication.  It is important for you to complete the blood thinner medication as prescribed by your surgeon.  Continue your approved medications as instructed at time of discharge.  PRECAUTIONS If you experience chest pain or shortness of breath - call 911 immediately for transfer to the hospital emergency department.  If you develop a fever greater that 101 F, purulent drainage from wound, increased redness or drainage from wound, foul odor from the wound/dressing, or calf pain - CONTACT YOUR SURGEON.                                                   FOLLOW-UP APPOINTMENTS Make sure you keep all of your appointments after your operation with your surgeon and caregivers. You should call the office at the above phone number and make an appointment for approximately two weeks after the date of your surgery or on the date instructed by your surgeon outlined in the "After Visit Summary".  MAKE SURE YOU:   Understand these instructions.   Get help right away if you are not doing well or get worse.    Pick up stool softner and laxative for home use following surgery while on pain medications. Do not submerge incision under water. Please use good hand washing techniques while changing dressing each day. May shower starting three days after surgery. Please use  a clean towel to pat the incision dry following showers. Continue to use ice for pain and swelling after surgery. Do not use any lotions or creams on the incision until instructed by your surgeon.

## 2018-12-03 NOTE — Brief Op Note (Signed)
12/03/2018  3:39 PM  PATIENT:  Crystal [name]  43 y.o. female  PRE-OPERATIVE DIAGNOSIS:  painful hardware right tibia  POST-OPERATIVE DIAGNOSIS:  painful hardware right tibia  PROCEDURE:  Procedure(s) with comments: Right tibia screw removal (Right) - 64min  SURGEON:  Surgeon(s) and Role:    Gaynelle Arabian, MD - Primary  PHYSICIAN ASSISTANT:   ASSISTANTS: none   ANESTHESIA:   general  EBL:  25 mL   BLOOD ADMINISTERED:none  DRAINS: none   LOCAL MEDICATIONS USED:  MARCAINE     COUNTS:  YES  TOURNIQUET:   Total Tourniquet Time Documented: Thigh (Left) - 24 minutes Total: Thigh (Left) - 24 minutes   DICTATION: .Other Dictation: Dictation Number [id number]  PLAN OF CARE: Discharge to home after PACU  PATIENT DISPOSITION:  PACU - hemodynamically stable.

## 2018-12-03 NOTE — Transfer of Care (Signed)
Immediate Anesthesia Transfer of Care Note  Patient: Crystal [name]  Procedure(s) Performed: Right tibia screw removal (Right )  Patient Location: PACU  Anesthesia Type:General  Level of Consciousness: awake, alert  and oriented  Airway & Oxygen Therapy: Patient Spontanous Breathing and Patient connected to face mask oxygen  Post-op Assessment: Report given to RN and Post -op Vital signs reviewed and stable  Post vital signs: Reviewed and stable  Last Vitals:  Vitals Value Taken Time  BP 112/74 12/03/18 1542  Temp    Pulse 79 12/03/18 1544  Resp 16 12/03/18 1544  SpO2 100 % 12/03/18 1544  Vitals shown include unvalidated device data.  Last Pain:  Vitals:   12/03/18 1126  TempSrc: Oral         Complications: No apparent anesthesia complications

## 2018-12-03 NOTE — Op Note (Signed)
NAME: CONNELLY, SPRUELL MEDICAL RECORD VD:[id number] ACCOUNT [id number] DATE OF BIRTH:[birth year] [facility] LOCATION: [facility] PHYSICIAN:Tyreona Panjwani Zella Ball, MD  OPERATIVE REPORT  DATE OF PROCEDURE:  12/03/2018  PREOPERATIVE DIAGNOSIS:  Painful hardware, right tibia.  POSTOPERATIVE DIAGNOSIS:  Painful hardware, right tibia.  PROCEDURE:  Hardware removal, right tibia.  SURGEON:  Gaynelle Arabian, MD  ASSISTANT:  None.  ANESTHESIA:  General.  ESTIMATED BLOOD LOSS:  Minimal.  DRAINS:  None.  TOURNIQUET TIME:  24 minutes at 300 mmHg.  COMPLICATIONS:  None.  CONDITION:  Stable to recovery.  BRIEF CLINICAL NOTE:  Crystal [name] is a 43 year old female who had open reduction internal fixation of right tibial plateau fracture a year ago.  She has done very well and healed this uneventfully.  Recently, she started to notice the screw heads visible at  the level of her skin in the medial part of the knee.  She did have tenderness over these areas where the hardware was visible.  It did not protrude through the skin, but nonetheless, the hardware was palpable.  She presents today for hardware removal.  PROCEDURE IN DETAIL:  After successful administration of general anesthetic, tourniquet was placed on the right thigh.  Right lower extremity prepped and draped in the usual sterile fashion.  Extremity was wrapped in Esmarch, tourniquet inflated to 300  mmHg.  The medial based incision, which was hockey-shaped incision was reutilized.  Skin cut with a 10 blade through subcutaneous tissue down to the periosteum.  The tissue was subperiosteally elevated to reveal the plate and screws.  Six screws were  removed from the plate and then the plate was removed.  There were 2 other additional cannulated screws slightly posterior and medial to this, which were also removed.  After the hardware was out, the wound was copiously irrigated with saline solution.   The periosteum was reapproximated with  interrupted 0 Vicryl.  Tourniquet was released.  Total time of 24 minutes.  Subcutaneous was closed with interrupted 2-0 Vicryl and subcutaneous and skin closed with a running 4-0 nylon.  Incisions were cleaned and  dried and a bulky sterile dressing applied.    She was then awakened and transported to recovery in stable condition.  AN/NUANCE  D:12/03/2018 T:12/03/2018 JOB:[id number]

## 2018-12-03 NOTE — Anesthesia Procedure Notes (Signed)
Procedure Name: LMA Insertion Date/Time: 12/03/2018 2:33 PM Performed by: [place] Eyal Greenhaw C, CRNA Pre-anesthesia Checklist: Patient identified, Emergency Drugs available, Suction available and Patient being monitored Patient Re-evaluated:Patient Re-evaluated prior to induction Oxygen Delivery Method: Circle system utilized Preoxygenation: Pre-oxygenation with 100% oxygen Induction Type: IV induction Ventilation: Mask ventilation without difficulty LMA: LMA inserted LMA Size: 4.0 Tube size: 4.5 mm Number of attempts: 1 Airway Equipment and Method: Bite block Placement Confirmation: positive ETCO2 Tube secured with: Tape Dental Injury: Teeth and Oropharynx as per pre-operative assessment

## 2018-12-03 NOTE — H&P (Signed)
[name] is a 43 y.o. female who presents with right knee pain.  HPI- . Knee Pain: Patient presents with knee pain involving the  right knee. Onset of the symptoms was several weeks ago. Inciting event: She had ORIF of a right tibial plateau fracture in 10/2017 and healed uneventfully but she has lost weight and developed pain over prominent screw heads. The screw heads are palpable subcutaneously and are tender to palpation. She presents today for hardware removal right tibia.   Past Medical History:  Diagnosis Date  . Alcohol abuse   . Anorexia nervosa   . Anxiety   . Avascular necrosis of bone of hip ([place])   . Bipolar affective disorder, mixed ([place])   . Cancer ([place])   . Chronic pain   . Depression   . Dilated cardiomyopathy secondary to drug ([place])   . DJD (degenerative joint disease)    "BONE STICKS OUT ON LEFT PART OF LOWER BACK"  . DVT of upper extremity (deep vein thrombosis) ([place]) 2014  . Edema of upper extremity   . Family history of anesthesia complication    MOTHER PONV  . Frequency of urination   . GERD (gastroesophageal reflux disease)    OCCASIONALLY TAKE ZANTAC  . History of breast cancer DX JUNE 2012 W/ RIGHT BREAST INVASIVE DUCTAL CA  STAGE IIB---  S/P BILATERAL MASTECTOMIES AND RIGHT NODE DISSECTION   ONCOLOGIST-- DR Jana Hakim--  CHEMO ENDED [place] 2012 / RADIATION ENDED MAR 2013--  CURRENT ON TAMOXIFEN AND ZOLADEX  . History of idiopathic seizure    DX 2008 --  LAST ONE 2012--  NO ISSUES SINCE AND NO MEDS. -- PT STATES DOCTORS FELT IT WAS STRESS RELATED DUE TO HEALTH ISSUES  . Hot flashes due to tamoxifen   . Hx MRSA infection 03/28/11   Skin  . Left ovarian cyst   . Migraines   . Neuropathy   . Nocturia   . Right lower quadrant abdominal pain   . Seizures ([place])    2016 last seizure   . Status post chemotherapy    docetaxel/carboplatin/trastuzumab  . Strains to urinate   . Urgency of urination     Past Surgical History:  Procedure Laterality Date   . BIOPSY BREAST  10/14/2010   right needle core biopsy  . BREAST SURGERY  11/16/2010   bilateral mastectomy+ right axillary node dissection,T1cN1a, Her2+,ERPR+  (right breast invasive ductal carcinoma)  . CYSTO WITH HYDRODISTENSION N/A 07/23/2012   Procedure: CYSTOSCOPY/HYDRODISTENSION instillation of marcaine and pyridium;  Surgeon: Reece Packer, MD;  Location: [facility];  Service: Urology;  Laterality: N/A;  INSTILLATION    . I&D EXTREMITY  09/26/2011   Procedure: IRRIGATION AND DEBRIDEMENT EXTREMITY;  Surgeon: Tennis Must, MD;  [facility];  Service: Orthopedics;  Laterality: Right;  I&D right hand cat bite wound  . ORIF TIBIA PLATEAU Right 11/27/2017   Procedure: OPEN REDUCTION INTERNAL FIXATION (ORIF) TIBIAL PLATEAU FRACTURE;  Surgeon: Gaynelle Arabian, MD;  [facility];  Service: Orthopedics;  Laterality: Right;  . PORTACATH PLACEMENT  11/16/2010   placement of left subclavian port NOW REMOVED  . right knee torn meniscus surgery     . TOTAL HIP ARTHROPLASTY Left 06/13/2018   Procedure: TOTAL HIP ARTHROPLASTY ANTERIOR APPROACH;  Surgeon: Gaynelle Arabian, MD;  [facility];  Service: Orthopedics;  Laterality: Left;  167mn  . TRANSTHORACIC ECHOCARDIOGRAM  05-28-2012   LVF NORMAL/  EF 55-60%  . WISDOM TOOTH EXTRACTION  Prior to Admission medications   Medication Sig Start Date End Date Taking? Authorizing Provider  albuterol (PROVENTIL HFA;VENTOLIN HFA) 108 (90 Base) MCG/ACT inhaler Inhale 2 puffs into the lungs every 4 (four) hours as needed for wheezing or shortness of breath.  10/31/17  Yes [provider]  ALPRAZolam (XANAX) 1 MG tablet TAKE 1 TABLET BY MOUTH 3 TIMES A DAY AS NEEDED FOR ANXIETY Patient taking differently: Take 1 mg by mouth 3 (three) times daily.  05/12/15  Yes Ezekiel Slocumb, PA-C  esomeprazole (NEXIUM) 20 MG capsule Take 20 mg by mouth daily before breakfast.    Yes [provider]  gabapentin (NEURONTIN) 600 MG  tablet Take 1,200-1,800 mg by mouth See admin instructions. Take 1800 mg by mouth in the morning and 1200 mg in the afternoon.   Yes [provider]  traZODone (DESYREL) 100 MG tablet Take 100-150 mg by mouth at bedtime as needed for sleep.    Yes [provider]  diphenhydrAMINE-zinc acetate (BENADRYL EXTRA STRENGTH) cream Apply 1 application topically 3 (three) times daily as needed for itching. Patient not taking: Reported on 11/23/2018 07/05/18   [name], Virgie Dad, MD   Right knee Exam antalgic gait,  No effusion, collateral ligaments intact, tender over medial hardware proximal tibis with no skin breakdown; normal range of motion of knee with no discomfort  Physical Examination: General appearance - alert, well appearing, and in no distress Mental status - alert, oriented to person, place, and time Chest - clear to auscultation, no wheezes, rales or rhonchi, symmetric air entry Heart - normal rate, regular rhythm, normal S1, S2, no murmurs, rubs, clicks or gallops Abdomen - soft, nontender, nondistended, no masses or organomegaly Neurological - alert, oriented, normal speech, no focal findings or movement disorder noted   Asessment/Plan--- Painful hardware right tibia- - Plan hardware removal right tibia.  Procedure risks and potential comps discussed with patient who elects to proceed. Goals are decreased pain and increased function with a high likelihood of achieving both

## 2018-12-04 ENCOUNTER — Encounter (HOSPITAL_COMMUNITY): Payer: Self-pay | Admitting: Orthopedic Surgery

## 2019-01-10 ENCOUNTER — Other Ambulatory Visit: Payer: Self-pay

## 2019-01-10 ENCOUNTER — Encounter (HOSPITAL_COMMUNITY): Payer: Self-pay | Admitting: Emergency Medicine

## 2019-01-10 ENCOUNTER — Emergency Department (HOSPITAL_COMMUNITY)
Admission: EM | Admit: 2019-01-10 | Discharge: 2019-01-11 | Disposition: A | Discharge status: Home / Self Care | Payer: Medicare Other | Attending: Emergency Medicine | Admitting: Emergency Medicine

## 2019-01-10 DIAGNOSIS — F17210 Nicotine dependence, cigarettes, uncomplicated: Secondary | ICD-10-CM | POA: Insufficient documentation

## 2019-01-10 DIAGNOSIS — R4182 Altered mental status, unspecified: Secondary | ICD-10-CM | POA: Diagnosis present

## 2019-01-10 DIAGNOSIS — Z79899 Other long term (current) drug therapy: Secondary | ICD-10-CM | POA: Insufficient documentation

## 2019-01-10 DIAGNOSIS — Z853 Personal history of malignant neoplasm of breast: Secondary | ICD-10-CM | POA: Insufficient documentation

## 2019-01-10 DIAGNOSIS — Z96642 Presence of left artificial hip joint: Secondary | ICD-10-CM | POA: Insufficient documentation

## 2019-01-10 DIAGNOSIS — R400 Somnolence: Secondary | ICD-10-CM | POA: Insufficient documentation

## 2019-01-10 LAB — COMPREHENSIVE METABOLIC PANEL WITH GFR
ALT: 13 U/L (ref 0–44)
AST: 21 U/L (ref 15–41)
Albumin: 4.4 g/dL (ref 3.5–5.0)
Alkaline Phosphatase: 54 U/L (ref 38–126)
Anion gap: 11 (ref 5–15)
BUN: 9 mg/dL (ref 6–20)
CO2: 23 mmol/L (ref 22–32)
Calcium: 9.4 mg/dL (ref 8.9–10.3)
Chloride: 110 mmol/L (ref 98–111)
Creatinine, Ser: 0.7 mg/dL (ref 0.44–1.00)
GFR calc Af Amer: >60 mL/min
GFR calc non Af Amer: >60 mL/min
Glucose, Bld: 101 mg/dL — ABNORMAL HIGH (ref 70–99)
Potassium: 3.8 mmol/L (ref 3.5–5.1)
Sodium: 144 mmol/L (ref 135–145)
Total Bilirubin: 0.6 mg/dL (ref 0.3–1.2)
Total Protein: 7.1 g/dL (ref 6.5–8.1)

## 2019-01-10 LAB — CBC
HCT: 44.3 % (ref 36.0–46.0)
Hemoglobin: 14.9 g/dL (ref 12.0–15.0)
MCH: 33.3 pg (ref 26.0–34.0)
MCHC: 33.6 g/dL (ref 30.0–36.0)
MCV: 99.1 fL (ref 80.0–100.0)
Platelets: 198 10*3/uL (ref 150–400)
RBC: 4.47 MIL/uL (ref 3.87–5.11)
RDW: 12.8 % (ref 11.5–15.5)
WBC: 10 10*3/uL (ref 4.0–10.5)
nRBC: 0 % (ref 0.0–0.2)

## 2019-01-10 LAB — CBG MONITORING, ED: Glucose-Capillary: 93 mg/dL (ref 70–99)

## 2019-01-10 LAB — I-STAT BETA HCG BLOOD, ED (MC, WL, AP ONLY): I-stat hCG, quantitative: <5 m[IU]/mL

## 2019-01-10 NOTE — ED Triage Notes (Signed)
Per EMS: Pt states she is tired with twitching in upper extremities.  States hx of seizures.  Pt is alert and oriented x 3.  When asked how many quarters in a dollar, pt states "45".

## 2019-01-10 NOTE — ED Triage Notes (Signed)
Pt appears to be jittery.  C/o exhaustion.  States that she has had "way less than 5 cups of coffee".  Denies any drug use.  Pt unable to remember what medications she takes at home.  Pt remembers xanax, gabapentin.  Pupils +4 bilaterally.  Denies any SI/HI or being in any unsafe relationships.

## 2019-01-11 NOTE — ED Provider Notes (Signed)
[facility] DEPT Provider Note  CSN: [id number] Arrival date & time: 01/10/19 1942  Chief Complaint(s) Altered Mental Status  HPI Crystal [name] is a 43 y.o. female    Altered Mental Status Presenting symptoms: lethargy   Severity:  Moderate Most recent episode:  Today Episode history:  Multiple Chronicity:  Recurrent Context: recent change in medication   Context comment:  Excessive caffine use Associated symptoms: no abdominal pain, no agitation, no depression, no fever, no nausea, no palpitations, no suicidal behavior, no vomiting and no weakness     Past Medical History Past Medical History:  Diagnosis Date  . Alcohol abuse   . Anorexia nervosa   . Anxiety   . Avascular necrosis of bone of hip ([place])   . Bipolar affective disorder, mixed ([place])   . Cancer ([place])   . Chronic pain   . Depression   . Dilated cardiomyopathy secondary to drug ([place])   . DJD (degenerative joint disease)    "BONE STICKS OUT ON LEFT PART OF LOWER BACK"  . DVT of upper extremity (deep vein thrombosis) ([place]) 2014  . Edema of upper extremity   . Family history of anesthesia complication    MOTHER PONV  . Frequency of urination   . GERD (gastroesophageal reflux disease)    OCCASIONALLY TAKE ZANTAC  . History of breast cancer DX JUNE 2012 W/ RIGHT BREAST INVASIVE DUCTAL CA  STAGE IIB---  S/P BILATERAL MASTECTOMIES AND RIGHT NODE DISSECTION   ONCOLOGIST-- DR Jana Hakim--  CHEMO ENDED [place] 2012 / RADIATION ENDED MAR 2013--  CURRENT ON TAMOXIFEN AND ZOLADEX  . History of idiopathic seizure    DX 2008 --  LAST ONE 2012--  NO ISSUES SINCE AND NO MEDS. -- PT STATES DOCTORS FELT IT WAS STRESS RELATED DUE TO HEALTH ISSUES  . Hot flashes due to tamoxifen   . Hx MRSA infection 03/28/11   Skin  . Left ovarian cyst   . Migraines   . Neuropathy   . Nocturia   . Right lower quadrant abdominal pain   . Seizures ([place])    2016 last seizure   . Status post chemotherapy    docetaxel/carboplatin/trastuzumab  . Strains to urinate   . Urgency of urination    Patient Active Problem List   Diagnosis Date Noted  . Osteonecrosis of hip ([place]) 06/13/2018  . Tibial plateau fracture, right, closed, initial encounter 11/27/2017  . Anorexia nervosa 08/19/2014  . Back pain 08/19/2014  . Malnutrition, calorie ([place]) 12/03/2013  . Migraines 05/21/2013  . Breast cancer of upper-outer quadrant of right female breast ([place]) 03/21/2013  . Chronic pain 01/09/2013  . DVT of upper extremity (deep vein thrombosis) ([place]) 01/02/2013  . Arm edema 12/27/2012  . Neuropathic pain 12/27/2012  . Chronic female pelvic pain 08/21/2012  . Fatigue 08/20/2012  . Chemotherapy induced cardiomyopathy ([place]) 09/08/2011  . Generalized convulsive seizure ([place]) 07/13/2011  . Seizure disorder ([place]) 07/13/2011  . Migraine 07/13/2011  . Anxiety disorder 07/13/2011  . Depression 07/13/2011  . Hx MRSA infection 04/13/2011   Home Medication(s) Prior to Admission medications   Medication Sig Start Date End Date Taking? Authorizing Provider  albuterol (PROVENTIL HFA;VENTOLIN HFA) 108 (90 Base) MCG/ACT inhaler Inhale 2 puffs into the lungs every 4 (four) hours as needed for wheezing or shortness of breath.  10/31/17  Yes [provider]  ALPRAZolam (XANAX) 1 MG tablet TAKE 1 TABLET BY MOUTH 3 TIMES A DAY AS NEEDED FOR ANXIETY Patient taking differently: Take  1 mg by mouth 3 (three) times daily.  05/12/15  Yes Ezekiel Slocumb, PA-C  esomeprazole (NEXIUM) 20 MG capsule Take 20 mg by mouth daily before breakfast.    Yes [provider]  eszopiclone (LUNESTA) 1 MG TABS tablet Take 3 mg by mouth at bedtime as needed for sleep. Take immediately before bedtime   Yes [provider]  gabapentin (NEURONTIN) 600 MG tablet Take 1,200-1,800 mg by mouth See admin instructions. Take 1800 mg by mouth in the morning and 1200 mg in the afternoon.   Yes [provider]  diphenhydrAMINE-zinc  acetate (BENADRYL EXTRA STRENGTH) cream Apply 1 application topically 3 (three) times daily as needed for itching. Patient not taking: Reported on 11/23/2018 07/05/18   [name], Virgie Dad, MD  HYDROcodone-acetaminophen (NORCO) 5-325 MG tablet Take 1 tablet by mouth every 6 (six) hours as needed for moderate pain. Patient not taking: Reported on 01/10/2019 12/03/18   Gaynelle Arabian, MD                                                                                                                                    Past Surgical History Past Surgical History:  Procedure Laterality Date  . BIOPSY BREAST  10/14/2010   right needle core biopsy  . BREAST SURGERY  11/16/2010   bilateral mastectomy+ right axillary node dissection,T1cN1a, Her2+,ERPR+  (right breast invasive ductal carcinoma)  . CYSTO WITH HYDRODISTENSION N/A 07/23/2012   Procedure: CYSTOSCOPY/HYDRODISTENSION instillation of marcaine and pyridium;  Surgeon: Reece Packer, MD;  Location: [facility];  Service: Urology;  Laterality: N/A;  INSTILLATION    . HARDWARE REMOVAL Right 12/03/2018   Procedure: Right tibia screw removal;  Surgeon: Gaynelle Arabian, MD;  [facility];  Service: Orthopedics;  Laterality: Right;  33mn  . I&D EXTREMITY  09/26/2011   Procedure: IRRIGATION AND DEBRIDEMENT EXTREMITY;  Surgeon: KTennis Must MD;  [facility];  Service: Orthopedics;  Laterality: Right;  I&D right hand cat bite wound  . ORIF TIBIA PLATEAU Right 11/27/2017   Procedure: OPEN REDUCTION INTERNAL FIXATION (ORIF) TIBIAL PLATEAU FRACTURE;  Surgeon: AGaynelle Arabian MD;  [facility];  Service: Orthopedics;  Laterality: Right;  . PORTACATH PLACEMENT  11/16/2010   placement of left subclavian port NOW REMOVED  . right knee torn meniscus surgery     . TOTAL HIP ARTHROPLASTY Left 06/13/2018   Procedure: TOTAL HIP ARTHROPLASTY ANTERIOR APPROACH;  Surgeon: AGaynelle Arabian MD;  [facility];  Service: Orthopedics;  Laterality:  Left;  1038m  . TRANSTHORACIC ECHOCARDIOGRAM  05-28-2012   LVF NORMAL/  EF 55-60%  . WISDOM TOOTH EXTRACTION     Family History Family History  Problem Relation Age of Onset  . Cancer Maternal Grandfather   . Mental illness Brother   . Mental illness Son     Social History Social History   Tobacco Use  . Smoking status: Current Every  Day Smoker    Packs/day: 1.00    Years: 20.00    Pack years: 20.00    Types: Cigarettes  . Smokeless tobacco: Never Used  Substance Use Topics  . Alcohol use: Not Currently    Comment: 15 beers per week ON AND OFF, 7-30 " I STOPPED DRINKING"   . Drug use: No    Comment: LAST USED COCAINE AND MARIJIUANA 2011   Allergies Tramadol, Phenytoin, Thorazine [chlorpromazine hcl], Ciprofloxacin, and Vancomycin  Review of Systems Review of Systems  Constitutional: Negative for fever.  Cardiovascular: Negative for palpitations.  Gastrointestinal: Negative for abdominal pain, nausea and vomiting.  Neurological: Negative for weakness.  Psychiatric/Behavioral: Negative for agitation.   All other systems are reviewed and are negative for acute change except as noted in the HPI  Physical Exam Vital Signs  I have reviewed the triage vital signs BP 101/65   Pulse (!) 49   Temp 98.2 F (36.8 C) (Oral)   Resp (!) 21   SpO2 97%   Physical Exam Vitals signs reviewed.  Constitutional:      General: She is not in acute distress.    Appearance: She is well-developed. She is not diaphoretic.  HENT:     Head: Normocephalic and atraumatic.     Nose: Nose normal.  Eyes:     General: No scleral icterus.       Right eye: No discharge.        Left eye: No discharge.     Conjunctiva/sclera: Conjunctivae normal.     Pupils: Pupils are equal, round, and reactive to light.  Neck:     Musculoskeletal: Normal range of motion and neck supple.  Cardiovascular:     Rate and Rhythm: Normal rate and regular rhythm.     Heart sounds: No murmur. No friction rub.  No gallop.   Pulmonary:     Effort: Pulmonary effort is normal. No respiratory distress.     Breath sounds: Normal breath sounds. No stridor. No rales.  Abdominal:     General: There is no distension.     Palpations: Abdomen is soft.     Tenderness: There is no abdominal tenderness.  Musculoskeletal:        General: No tenderness.  Skin:    General: Skin is warm and dry.     Findings: No erythema or rash.  Neurological:     Mental Status: She is alert and oriented to person, place, and time.     ED Results and Treatments Labs (all labs ordered are listed, but only abnormal results are displayed) Labs Reviewed  COMPREHENSIVE METABOLIC PANEL - Abnormal; Notable for the following components:      Result Value   Glucose, Bld 101 (*)    All other components within normal limits  CBC  CBG MONITORING, ED  I-STAT BETA HCG BLOOD, ED ([facility], [facility], [facility] ONLY)  EKG  EKG Interpretation  Date/Time:    Ventricular Rate:    PR Interval:    QRS Duration:   QT Interval:    QTC Calculation:   R Axis:     Text Interpretation:        Radiology No results found.  Pertinent labs & imaging results that were available during my care of the patient were reviewed by me and considered in my medical decision making (see chart for details).  Medications Ordered in ED Medications - No data to display                                                                                                                                  Procedures Procedures  (including critical care time)  Medical Decision Making / ED Course I have reviewed the nursing notes for this encounter and the patient's prior records (if available in EHR or on provided paperwork).   Crystalina L Leone was evaluated in Emergency Department on 01/11/2019 for the symptoms described in the history of present illness.  She was evaluated in the context of the global COVID-19 pandemic, which necessitated consideration that the patient might be at risk for infection with the SARS-CoV-2 virus that causes COVID-19. Institutional protocols and algorithms that pertain to the evaluation of patients at risk for COVID-19 are in a state of rapid change based on information released by regulatory bodies including the CDC and federal and state organizations. These policies and algorithms were followed during the patient's care in the ED.  Patient here for somnolence and excessive sleeping. She was changed from trazadone to [place] last month. Admits to drinking 5-6 cups of coffee in the am.  Has had similar episodes attributed to caffeine  crash. No recent fever or infections.   Denies alcohol use. No additional illicit drug use. No excess use of benzo per patient. Attempted to contact husband for additional history, but went straight to VM.  She is AOx3. Exam nonfocal. She is appropriate and cooperative. Does not appear to be manic or in acute psychosis.  The patient appears reasonably screened and/or stabilized for discharge and I doubt any other medical condition or other [facility] requiring further screening, evaluation, or treatment in the ED at this time prior to discharge.  The patient is safe for discharge with strict return precautions.         Final Clinical Impression(s) / ED Diagnoses Final diagnoses:  Somnolence, daytime    The patient appears reasonably screened and/or stabilized for discharge and I doubt any other medical condition or other [facility] requiring further screening, evaluation, or treatment in the ED at this time prior to discharge.  Disposition: Discharge  Condition: Good  I have discussed the results, Dx and Tx plan with the patient who expressed understanding and agree(s) with the plan. Discharge instructions discussed at great length. The patient was given strict return precautions who  verbalized understanding of the  instructions. No further questions at time of discharge.    ED Discharge Orders    None        Follow Up: Primary care provider  Schedule an appointment as soon as possible for a visit  discuss referrel to sleep specialist      This chart was dictated using voice recognition software.  Despite best efforts to proofread,  errors can occur which can change the documentation meaning.   Fatima Blank, MD 01/11/19 [phone]

## 2019-07-07 NOTE — Progress Notes (Signed)
No show

## 2019-07-08 ENCOUNTER — Other Ambulatory Visit: Payer: Medicare Other

## 2019-07-08 ENCOUNTER — Encounter: Payer: Self-pay | Admitting: Oncology

## 2019-07-08 ENCOUNTER — Ambulatory Visit (HOSPITAL_BASED_OUTPATIENT_CLINIC_OR_DEPARTMENT_OTHER): Payer: Medicare Other | Admitting: Oncology

## 2019-07-08 DIAGNOSIS — C50411 Malignant neoplasm of upper-outer quadrant of right female breast: Secondary | ICD-10-CM

## 2019-07-08 DIAGNOSIS — G40909 Epilepsy, unspecified, not intractable, without status epilepticus: Secondary | ICD-10-CM

## 2019-07-11 ENCOUNTER — Telehealth: Payer: Self-pay | Admitting: Oncology

## 2019-07-11 NOTE — Telephone Encounter (Signed)
Called pt per 3/11 sch message - no answer .  Left msg for pt to call back to reschedule

## 2019-08-20 ENCOUNTER — Emergency Department (HOSPITAL_COMMUNITY)
Admission: EM | Admit: 2019-08-20 | Discharge: 2019-08-21 | Disposition: A | Discharge status: Other Acute Inpatient Hospital | Payer: Medicare Other | Attending: Emergency Medicine | Admitting: Emergency Medicine

## 2019-08-20 ENCOUNTER — Other Ambulatory Visit: Payer: Self-pay

## 2019-08-20 ENCOUNTER — Encounter (HOSPITAL_COMMUNITY): Payer: Self-pay | Admitting: Emergency Medicine

## 2019-08-20 DIAGNOSIS — X781XXA Intentional self-harm by knife, initial encounter: Secondary | ICD-10-CM | POA: Diagnosis not present

## 2019-08-20 DIAGNOSIS — F410 Panic disorder [episodic paroxysmal anxiety] without agoraphobia: Secondary | ICD-10-CM | POA: Diagnosis not present

## 2019-08-20 DIAGNOSIS — Y939 Activity, unspecified: Secondary | ICD-10-CM | POA: Insufficient documentation

## 2019-08-20 DIAGNOSIS — F3160 Bipolar disorder, current episode mixed, unspecified: Secondary | ICD-10-CM | POA: Diagnosis not present

## 2019-08-20 DIAGNOSIS — Z79899 Other long term (current) drug therapy: Secondary | ICD-10-CM | POA: Diagnosis not present

## 2019-08-20 DIAGNOSIS — X789XXA Intentional self-harm by unspecified sharp object, initial encounter: Secondary | ICD-10-CM

## 2019-08-20 DIAGNOSIS — T1491XA Suicide attempt, initial encounter: Secondary | ICD-10-CM

## 2019-08-20 DIAGNOSIS — F319 Bipolar disorder, unspecified: Secondary | ICD-10-CM | POA: Insufficient documentation

## 2019-08-20 DIAGNOSIS — F17210 Nicotine dependence, cigarettes, uncomplicated: Secondary | ICD-10-CM | POA: Diagnosis not present

## 2019-08-20 DIAGNOSIS — Z23 Encounter for immunization: Secondary | ICD-10-CM | POA: Insufficient documentation

## 2019-08-20 DIAGNOSIS — Z853 Personal history of malignant neoplasm of breast: Secondary | ICD-10-CM | POA: Insufficient documentation

## 2019-08-20 DIAGNOSIS — Z20822 Contact with and (suspected) exposure to covid-19: Secondary | ICD-10-CM | POA: Insufficient documentation

## 2019-08-20 DIAGNOSIS — Y999 Unspecified external cause status: Secondary | ICD-10-CM | POA: Diagnosis not present

## 2019-08-20 DIAGNOSIS — S1191XA Laceration without foreign body of unspecified part of neck, initial encounter: Secondary | ICD-10-CM | POA: Insufficient documentation

## 2019-08-20 DIAGNOSIS — S61512A Laceration without foreign body of left wrist, initial encounter: Secondary | ICD-10-CM | POA: Insufficient documentation

## 2019-08-20 DIAGNOSIS — T07XXXA Unspecified multiple injuries, initial encounter: Secondary | ICD-10-CM | POA: Diagnosis present

## 2019-08-20 DIAGNOSIS — F332 Major depressive disorder, recurrent severe without psychotic features: Secondary | ICD-10-CM | POA: Insufficient documentation

## 2019-08-20 DIAGNOSIS — Y92019 Unspecified place in single-family (private) house as the place of occurrence of the external cause: Secondary | ICD-10-CM | POA: Insufficient documentation

## 2019-08-20 LAB — COMPREHENSIVE METABOLIC PANEL
ALT: 22 U/L (ref 0–44)
AST: 27 U/L (ref 15–41)
Albumin: 4.5 g/dL (ref 3.5–5.0)
Alkaline Phosphatase: 54 U/L (ref 38–126)
Anion gap: 11 (ref 5–15)
BUN: 12 mg/dL (ref 6–20)
CO2: 23 mmol/L (ref 22–32)
Calcium: 10 mg/dL (ref 8.9–10.3)
Chloride: 103 mmol/L (ref 98–111)
Creatinine, Ser: 0.99 mg/dL (ref 0.44–1.00)
GFR calc Af Amer: >60 mL/min (ref >60)
GFR calc non Af Amer: >60 mL/min (ref >60)
Glucose, Bld: 102 mg/dL — ABNORMAL HIGH (ref 70–99)
Potassium: 4.4 mmol/L (ref 3.5–5.1)
Sodium: 137 mmol/L (ref 135–145)
Total Bilirubin: 0.5 mg/dL (ref 0.3–1.2)
Total Protein: 7.3 g/dL (ref 6.5–8.1)

## 2019-08-20 LAB — RAPID URINE DRUG SCREEN, HOSP PERFORMED
Amphetamines: NOT DETECTED
Barbiturates: NOT DETECTED
Benzodiazepines: POSITIVE — AB
Cocaine: NOT DETECTED
Opiates: NOT DETECTED
Tetrahydrocannabinol: NOT DETECTED

## 2019-08-20 LAB — CBC
HCT: 51.1 % — ABNORMAL HIGH (ref 36.0–46.0)
Hemoglobin: 17.5 g/dL — ABNORMAL HIGH (ref 12.0–15.0)
MCH: 32.8 pg (ref 26.0–34.0)
MCHC: 34.2 g/dL (ref 30.0–36.0)
MCV: 95.7 fL (ref 80.0–100.0)
Platelets: 213 10*3/uL (ref 150–400)
RBC: 5.34 MIL/uL — ABNORMAL HIGH (ref 3.87–5.11)
RDW: 11.8 % (ref 11.5–15.5)
WBC: 11.1 10*3/uL — ABNORMAL HIGH (ref 4.0–10.5)
nRBC: 0 % (ref 0.0–0.2)

## 2019-08-20 LAB — RESPIRATORY PANEL BY RT PCR (FLU A&B, COVID)
Influenza A by PCR: NEGATIVE
Influenza B by PCR: NEGATIVE
SARS Coronavirus 2 by RT PCR: NEGATIVE

## 2019-08-20 LAB — I-STAT BETA HCG BLOOD, ED (MC, WL, AP ONLY): I-stat hCG, quantitative: 67.8 m[IU]/mL — ABNORMAL HIGH (ref <5)

## 2019-08-20 LAB — ACETAMINOPHEN LEVEL: Acetaminophen (Tylenol), Serum: <10 ug/mL — ABNORMAL LOW (ref 10–30)

## 2019-08-20 LAB — ETHANOL: Alcohol, Ethyl (B): <10 mg/dL (ref <10)

## 2019-08-20 LAB — CBG MONITORING, ED: Glucose-Capillary: 80 mg/dL (ref 70–99)

## 2019-08-20 LAB — SALICYLATE LEVEL: Salicylate Lvl: <7 mg/dL — ABNORMAL LOW (ref 7.0–30.0)

## 2019-08-20 LAB — PREGNANCY, URINE: Preg Test, Ur: NEGATIVE

## 2019-08-20 LAB — HCG, QUANTITATIVE, PREGNANCY: hCG, Beta Chain, Quant, S: 1 m[IU]/mL (ref <5)

## 2019-08-20 MED ORDER — GABAPENTIN 400 MG PO CAPS: 1800.0000 mg | ORAL_CAPSULE | Freq: Every day | ORAL | Status: DC

## 2019-08-20 MED ORDER — PENTAFLUOROPROP-TETRAFLUOROETH EX AERO
INHALATION_SPRAY | Freq: Once | CUTANEOUS | Status: AC
  Filled 2019-08-20: qty 116

## 2019-08-20 MED ORDER — PANTOPRAZOLE SODIUM 40 MG PO TBEC: 40.0000 mg | DELAYED_RELEASE_TABLET | Freq: Every day | ORAL | Status: DC

## 2019-08-20 MED ORDER — ACETAMINOPHEN 325 MG PO TABS: 650.0000 mg | ORAL_TABLET | ORAL | Status: DC | PRN

## 2019-08-20 MED ORDER — GABAPENTIN 400 MG PO CAPS
1200.0000 mg | ORAL_CAPSULE | Freq: Every day | ORAL | Status: DC
  Administered 2019-08-21: 1200 mg via ORAL
  Filled 2019-08-20: qty 3

## 2019-08-20 MED ORDER — ALBUTEROL SULFATE HFA 108 (90 BASE) MCG/ACT IN AERS: 2.0000 | INHALATION_SPRAY | RESPIRATORY_TRACT | Status: DC | PRN

## 2019-08-20 MED ORDER — AMPHETAMINE-DEXTROAMPHET ER 30 MG PO CP24: 30.0000 mg | ORAL_CAPSULE | Freq: Every morning | ORAL | Status: DC

## 2019-08-20 MED ORDER — BUPIVACAINE HCL (PF) 0.5 % IJ SOLN
10.0000 mL | Freq: Once | INTRAMUSCULAR | Status: AC
  Administered 2019-08-20: 10 mL
  Filled 2019-08-20: qty 10

## 2019-08-20 MED ORDER — ALPRAZOLAM 0.5 MG PO TABS
2.0000 mg | ORAL_TABLET | Freq: Two times a day (BID) | ORAL | Status: DC | PRN
  Administered 2019-08-20 – 2019-08-21 (×2): 2 mg via ORAL
  Filled 2019-08-20 (×2): qty 4

## 2019-08-20 MED ORDER — TETANUS-DIPHTH-ACELL PERTUSSIS 5-2.5-18.5 LF-MCG/0.5 IM SUSP
0.5000 mL | Freq: Once | INTRAMUSCULAR | Status: AC
  Administered 2019-08-20: 0.5 mL via INTRAMUSCULAR
  Filled 2019-08-20: qty 0.5

## 2019-08-20 MED ORDER — LIDOCAINE-EPINEPHRINE (PF) 2 %-1:200000 IJ SOLN
10.0000 mL | Freq: Once | INTRAMUSCULAR | Status: AC
  Administered 2019-08-20: 10 mL
  Filled 2019-08-20: qty 20

## 2019-08-20 MED ORDER — TETANUS-DIPHTH-ACELL PERTUSSIS 5-2.5-18.5 LF-MCG/0.5 IM SUSP
0.5000 mL | Freq: Once | INTRAMUSCULAR | Status: DC
  Filled 2019-08-20: qty 0.5

## 2019-08-20 MED ORDER — GABAPENTIN 600 MG PO TABS: 1200.0000 mg | ORAL_TABLET | ORAL | Status: DC

## 2019-08-20 MED ORDER — ACETAMINOPHEN 325 MG PO TABS
650.0000 mg | ORAL_TABLET | Freq: Once | ORAL | Status: AC
  Administered 2019-08-20: 20:00:00 650 mg via ORAL
  Filled 2019-08-20: qty 2

## 2019-08-20 MED ORDER — CEPHALEXIN 250 MG PO CAPS
500.0000 mg | ORAL_CAPSULE | Freq: Two times a day (BID) | ORAL | Status: DC
  Administered 2019-08-20 – 2019-08-21 (×2): 500 mg via ORAL
  Filled 2019-08-20 (×2): qty 2

## 2019-08-20 NOTE — ED Triage Notes (Signed)
Pt reports trying to kill herself today. Tried cutting her neck and her wrist. States she is "pissed that she was unsuccessful". Pt has cut marks to throat and wrist.

## 2019-08-20 NOTE — ED Notes (Signed)
Patient is in hospital provided purple scrubs. Belongings inventoried and placed in locker 4. Wallet and currency with security. Medications with pharmacy. Pt provided counseling and questions answered prior to signing documents.

## 2019-08-20 NOTE — ED Notes (Signed)
[phone] pts husband Glendell Docker would like an update

## 2019-08-20 NOTE — ED Provider Notes (Signed)
[facility] EMERGENCY DEPARTMENT Provider Note   CSN: [id number] Arrival date & time: 08/20/19  1555     History Chief Complaint  Patient presents with  . Suicide Attempt    Crystal [name] is a 44 y.o. female with a past medical history of bipolar affective disorder, anxiety, anorexia, alcohol abuse, history of breast cancer status post bilateral mastectomies, seizures, who presents today for evaluation after suicide attempt.  She reports that she attempted to kill herself today.  She states that she and her husband are both very depressed.  She states that she wanted to kill herself by cutting her neck.  She states she tried to cut herself initially using a steak knife and when that did not work she escalated to a Electrical engineer for both her neck and her left wrist.  She is unsure when her last tetanus shot was.  She denies any stabbing motions especially to the neck, states that they were all horizontal type cuts.  She states that primarily she is "pissed off" that she was not successful.    She states she almost passed out twice during this.  She does not take any blood thinning medications.  She denies doing anything else in an attempt to actively harm herself.  She denies AVH.  She denies HI.  She denies any headache, neck pain, difficulty swallowing, breathing or voice change, dizziness or acute neuro symptoms since she cut her neck.  HPI     Past Medical History:  Diagnosis Date  . Alcohol abuse   . Anorexia nervosa   . Anxiety   . Avascular necrosis of bone of hip ([place])   . Bipolar affective disorder, mixed ([place])   . Cancer ([place])   . Chronic pain   . Depression   . Dilated cardiomyopathy secondary to drug ([place])   . DJD (degenerative joint disease)    "BONE STICKS OUT ON LEFT PART OF LOWER BACK"  . DVT of upper extremity (deep vein thrombosis) ([place]) 2014  . Edema of upper extremity   . Family history of anesthesia complication    MOTHER PONV  .  Frequency of urination   . GERD (gastroesophageal reflux disease)    OCCASIONALLY TAKE ZANTAC  . History of breast cancer DX JUNE 2012 W/ RIGHT BREAST INVASIVE DUCTAL CA  STAGE IIB---  S/P BILATERAL MASTECTOMIES AND RIGHT NODE DISSECTION   ONCOLOGIST-- DR Jana Hakim--  CHEMO ENDED [place] 2012 / RADIATION ENDED MAR 2013--  CURRENT ON TAMOXIFEN AND ZOLADEX  . History of idiopathic seizure    DX 2008 --  LAST ONE 2012--  NO ISSUES SINCE AND NO MEDS. -- PT STATES DOCTORS FELT IT WAS STRESS RELATED DUE TO HEALTH ISSUES  . Hot flashes due to tamoxifen   . Hx MRSA infection 03/28/11   Skin  . Left ovarian cyst   . Migraines   . Neuropathy   . Nocturia   . Right lower quadrant abdominal pain   . Seizures ([place])    2016 last seizure   . Status post chemotherapy    docetaxel/carboplatin/trastuzumab  . Strains to urinate   . Urgency of urination     Patient Active Problem List   Diagnosis Date Noted  . Osteonecrosis of hip ([place]) 06/13/2018  . Tibial plateau fracture, right, closed, initial encounter 11/27/2017  . Anorexia nervosa 08/19/2014  . Back pain 08/19/2014  . Malnutrition, calorie ([place]) 12/03/2013  . Migraines 05/21/2013  . Breast cancer of upper-outer quadrant of right  female breast ([place]) 03/21/2013  . Chronic pain 01/09/2013  . DVT of upper extremity (deep vein thrombosis) ([place]) 01/02/2013  . Arm edema 12/27/2012  . Neuropathic pain 12/27/2012  . Chronic female pelvic pain 08/21/2012  . Fatigue 08/20/2012  . Chemotherapy induced cardiomyopathy ([place]) 09/08/2011  . Generalized convulsive seizure ([place]) 07/13/2011  . Seizure disorder ([place]) 07/13/2011  . Migraine 07/13/2011  . Anxiety disorder 07/13/2011  . Depression 07/13/2011  . Hx MRSA infection 04/13/2011    Past Surgical History:  Procedure Laterality Date  . BIOPSY BREAST  10/14/2010   right needle core biopsy  . BREAST SURGERY  11/16/2010   bilateral mastectomy+ right axillary node dissection,T1cN1a, Her2+,ERPR+  (right  breast invasive ductal carcinoma)  . CYSTO WITH HYDRODISTENSION N/A 07/23/2012   Procedure: CYSTOSCOPY/HYDRODISTENSION instillation of marcaine and pyridium;  Surgeon: Reece Packer, MD;  Location: [facility];  Service: Urology;  Laterality: N/A;  INSTILLATION    . HARDWARE REMOVAL Right 12/03/2018   Procedure: Right tibia screw removal;  Surgeon: Gaynelle Arabian, MD;  Location: [facility];  Service: Orthopedics;  Laterality: Right;  30mn  . I & D EXTREMITY  09/26/2011   Procedure: IRRIGATION AND DEBRIDEMENT EXTREMITY;  Surgeon: KTennis Must MD;  Location: [facility];  Service: Orthopedics;  Laterality: Right;  I&D right hand cat bite wound  . ORIF TIBIA PLATEAU Right 11/27/2017   Procedure: OPEN REDUCTION INTERNAL FIXATION (ORIF) TIBIAL PLATEAU FRACTURE;  Surgeon: AGaynelle Arabian MD;  Location: [facility];  Service: Orthopedics;  Laterality: Right;  . PORTACATH PLACEMENT  11/16/2010   placement of left subclavian port NOW REMOVED  . right knee torn meniscus surgery     . TOTAL HIP ARTHROPLASTY Left 06/13/2018   Procedure: TOTAL HIP ARTHROPLASTY ANTERIOR APPROACH;  Surgeon: AGaynelle Arabian MD;  Location[facility];  Service: Orthopedics;  Laterality: Left;  1071m  . TRANSTHORACIC ECHOCARDIOGRAM  05-28-2012   LVF NORMAL/  EF 55-60%  . WISDOM TOOTH EXTRACTION       OB History    Gravida  1   Para  1   Term  1   Preterm  0   AB  0   Living  1     SAB  0   TAB  0   Ectopic  0   Multiple  0   Live Births              Family History  Problem Relation Age of Onset  . Cancer Maternal Grandfather   . Mental illness Brother   . Mental illness Son     Social History   Tobacco Use  . Smoking status: Current Every Day Smoker    Packs/day: 1.00    Years: 20.00    Pack years: 20.00    Types: Cigarettes  . Smokeless tobacco: Never Used  Substance Use Topics  . Alcohol use: Not Currently    Comment: 15 beers per week ON AND OFF, 7-30 " I STOPPED DRINKING"   .  Drug use: No    Comment: LAST USED COCAINE AND MARIJIUANA 2011    Home Medications Prior to Admission medications   Medication Sig Start Date End Date Taking? Authorizing Provider  albuterol (PROVENTIL HFA;VENTOLIN HFA) 108 (90 Base) MCG/ACT inhaler Inhale 2 puffs into the lungs every 4 (four) hours as needed for wheezing or shortness of breath.  10/31/17  Yes [provider]  ALPRAZolam (XANAX) 1 MG tablet TAKE 1 TABLET BY MOUTH 3 TIMES A DAY AS  NEEDED FOR ANXIETY Patient taking differently: Take 2 mg by mouth in the morning and at bedtime.  05/12/15  Yes Ezekiel Slocumb, PA-C  amphetamine-dextroamphetamine (ADDERALL XR) 30 MG 24 hr capsule Take 30 mg by mouth every morning. 08/08/19  Yes [provider]  diphenhydrAMINE-zinc acetate (BENADRYL EXTRA STRENGTH) cream Apply 1 application topically 3 (three) times daily as needed for itching. 07/05/18  Yes [name], Virgie Dad, MD  esomeprazole (NEXIUM) 20 MG capsule Take 20 mg by mouth daily before breakfast.    Yes [provider]  Eszopiclone 3 MG TABS Take 3 mg by mouth at bedtime as needed (for sleep).  07/10/19  Yes [provider]  gabapentin (NEURONTIN) 600 MG tablet Take 1,200-1,800 mg by mouth See admin instructions. Take 1,800 mg by mouth in the morning and 1,200 mg at bedtime   Yes [provider]  HYDROcodone-acetaminophen (NORCO) 5-325 MG tablet Take 1 tablet by mouth every 6 (six) hours as needed for moderate pain. Patient not taking: Reported on 08/20/2019 12/03/18   Gaynelle Arabian, MD    Allergies    Tramadol, Phenytoin, Thorazine [chlorpromazine hcl], Ciprofloxacin, and Vancomycin  Review of Systems   Review of Systems  Constitutional: Negative for chills and fever.  Respiratory: Negative for shortness of breath.   Cardiovascular: Negative for chest pain.  Gastrointestinal: Negative for abdominal pain.  Genitourinary: Negative for dysuria.  Skin: Positive for wound.  Neurological: Positive  for light-headedness (When she attempted to cut her self. ). Negative for headaches.  Psychiatric/Behavioral: Positive for behavioral problems, dysphoric mood, self-injury, sleep disturbance and suicidal ideas. The patient is nervous/anxious.   All other systems reviewed and are negative.   Physical Exam Updated Vital Signs BP 106/80 (BP Location: Right Arm)   Pulse 86   Temp 98.6 F (37 C) (Oral)   Resp 18   Ht 5' 4" (1.626 m)   Wt 54.4 kg   SpO2 99%   BMI 20.60 kg/m   Physical Exam Vitals and nursing note reviewed.  Constitutional:      General: She is not in acute distress.    Appearance: She is well-developed. She is not diaphoretic.  HENT:     Head: Normocephalic and atraumatic.  Eyes:     General: No scleral icterus.       Right eye: No discharge.        Left eye: No discharge.     Conjunctiva/sclera: Conjunctivae normal.  Neck:     Trachea: Trachea and phonation normal.     Comments: No crepitus, no palpable subcutaneous emphysema.  No active bleeding from neck wounds. When the neck with ends are cleaned, viewed in a bloodless field they are superficial, not extend into fascia, no vascular structures visualized on exam of wound base in a clean, bloodless field. Most neck wounds are not through the dermis.   Cardiovascular:     Rate and Rhythm: Normal rate and regular rhythm.     Pulses: Normal pulses.     Heart sounds: Normal heart sounds.  Pulmonary:     Effort: Pulmonary effort is normal. No respiratory distress.     Breath sounds: No stridor.  Chest:     Comments: Status post bilateral mastectomy Abdominal:     General: There is no distension.     Tenderness: There is no abdominal tenderness. There is no guarding.  Musculoskeletal:        General: No deformity.     Cervical back: Full passive range of motion  without pain, normal range of motion and neck supple.     Comments: Left hand has intact strength, sensation, and circulatory function.  Left hand  distal to the lacerations has full AROM and 5/5 strength.  No evidence of tendon involvement, no tendons visualized in the wound bed.  Skin:    General: Skin is warm and dry.     Comments: Please see clinical pictures.  There are multiple superficial lacerations to the left arm and the anterior neck.  Superficial scratches present on right shoulder.  Pinpoint superficial wound on left anterior chest.   Neurological:     General: No focal deficit present.     Mental Status: She is alert and oriented to person, place, and time.     Motor: No abnormal muscle tone.             ED Results / Procedures / Treatments   Labs (all labs ordered are listed, but only abnormal results are displayed) Labs Reviewed  COMPREHENSIVE METABOLIC PANEL - Abnormal; Notable for the following components:      Result Value   Glucose, Bld 102 (*)    All other components within normal limits  SALICYLATE LEVEL - Abnormal; Notable for the following components:   Salicylate Lvl <1.8 (*)    All other components within normal limits  ACETAMINOPHEN LEVEL - Abnormal; Notable for the following components:   Acetaminophen (Tylenol), Serum <10 (*)    All other components within normal limits  CBC - Abnormal; Notable for the following components:   WBC 11.1 (*)    RBC 5.34 (*)    Hemoglobin 17.5 (*)    HCT 51.1 (*)    All other components within normal limits  RAPID URINE DRUG SCREEN, HOSP PERFORMED - Abnormal; Notable for the following components:   Benzodiazepines POSITIVE (*)    All other components within normal limits  I-STAT BETA HCG BLOOD, ED ([facility], [facility], [facility] ONLY) - Abnormal; Notable for the following components:   I-stat hCG, quantitative 67.8 (*)    All other components within normal limits  RESPIRATORY PANEL BY RT PCR (FLU A&B, COVID)  ETHANOL  PREGNANCY, URINE  HCG, QUANTITATIVE, PREGNANCY  CBG MONITORING, ED    EKG None  Radiology No results found.  Procedures .Marland KitchenLaceration  Repair  Date/Time: 08/20/2019 10:19 PM Performed by: Lorin Glass, PA-C Authorized by: Lorin Glass, PA-C   Consent:    Consent obtained:  Verbal   Consent given by:  Patient   Risks discussed:  Infection, need for additional repair, poor cosmetic result, pain, retained foreign body, tendon damage, vascular damage, poor wound healing and nerve damage   Alternatives discussed:  No treatment and referral (Alternative wound closures) Anesthesia (see MAR for exact dosages):    Anesthesia method:  Local infiltration   Local anesthetic: 50-50 mix lidocaine 2% with epi, bupivacaine 0.5% without epi. Laceration details:    Location:  Shoulder/arm   Shoulder/arm location:  L lower arm   Wound length (cm): total of 3 lacerations, each is approx 1 cm.  Repair type:    Repair type:  Simple Pre-procedure details:    Preparation:  Patient was prepped and draped in usual sterile fashion Exploration:    Hemostasis achieved with:  Epinephrine and direct pressure   Wound exploration: wound explored through full range of motion and entire depth of wound probed and visualized     Wound extent: no fascia violation noted, no muscle damage noted, no nerve damage noted, no  tendon damage noted and no vascular damage noted     Contaminated: yes   Treatment:    Area cleansed with:  Shur-Clens, saline and Betadine Skin repair:    Repair method:  Sutures   Suture size:  5-0   Suture material:  Prolene   Suture technique:  Simple interrupted   Number of sutures:  3 Approximation:    Approximation:  Close Post-procedure details:    Dressing:  Non-adherent dressing and bulky dressing   Patient tolerance of procedure:  Tolerated well, no immediate complications .Marland KitchenLaceration Repair  Date/Time: 08/20/2019 10:20 PM Performed by: Lorin Glass, PA-C Authorized by: Lorin Glass, PA-C   Consent:    Consent obtained:  Verbal   Consent given by:  Patient   Risks discussed:   Infection, need for additional repair, poor cosmetic result, pain, retained foreign body, tendon damage, vascular damage, poor wound healing and nerve damage   Alternatives discussed:  No treatment and referral (Alternative wound closures) Anesthesia (see MAR for exact dosages):    Anesthesia method:  None Laceration details:    Location:  Neck   Neck location:  L anterior   Wound length (cm): Total of two lacerations, each 1 cm, and one laceration that was 1cm.  Repair type:    Repair type:  Simple Exploration:    Hemostasis achieved with:  Direct pressure   Wound exploration: wound explored through full range of motion and entire depth of wound probed and visualized     Wound extent: no areolar tissue violation noted, no foreign bodies/material noted, no muscle damage noted, no nerve damage noted and no vascular damage noted     Contaminated: yes   Treatment:    Area cleansed with:  Saline Skin repair:    Repair method:  Tissue adhesive Approximation:    Approximation:  Close Post-procedure details:    Dressing:  Open (no dressing)   Patient tolerance of procedure:  Tolerated well, no immediate complications   (including critical care time)  Medications Ordered in ED Medications  acetaminophen (TYLENOL) tablet 650 mg (has no administration in time range)  cephALEXin (KEFLEX) capsule 500 mg (500 mg Oral Given 08/20/19 2253)  albuterol (VENTOLIN HFA) 108 (90 Base) MCG/ACT inhaler 2 puff (has no administration in time range)  pantoprazole (PROTONIX) EC tablet 40 mg (has no administration in time range)  gabapentin (NEURONTIN) tablet 1,200-1,800 mg (has no administration in time range)  ALPRAZolam (XANAX) tablet 2 mg (2 mg Oral Given 08/20/19 2255)  amphetamine-dextroamphetamine (ADDERALL XR) 24 hr capsule 30 mg (has no administration in time range)  lidocaine-EPINEPHrine (XYLOCAINE W/EPI) 2 %-1:200000 (PF) injection 10 mL (10 mLs Infiltration Given 08/20/19 2222)   pentafluoroprop-tetrafluoroeth (GEBAUERS) aerosol ( Topical Given 08/20/19 2222)  bupivacaine (MARCAINE) 0.5 % injection 10 mL (10 mLs Infiltration Given 08/20/19 2222)  acetaminophen (TYLENOL) tablet 650 mg (650 mg Oral Given 08/20/19 2026)  Tdap (BOOSTRIX) injection 0.5 mL (0.5 mLs Intramuscular Given 08/20/19 2044)    ED Course  I have reviewed the triage vital signs and the nursing notes.  Pertinent labs & imaging results that were available during my care of the patient were reviewed by me and considered in my medical decision making (see chart for details).    MDM Rules/Calculators/A&P                     Patient is a 44 year old woman who presents today for evaluation after a suicide attempt.  She attempted to cut her  left wrist and her neck first with a steak knife and then with a butcher knife.  This occurred approximately 3 hours prior to arrival. CBC shows elevated hemoglobin, consistent with patient being a smoker.  CMP is unremarkable.  Acetaminophen, salicylate, and ethanol levels are all negative.  I-STAT hCG quant was 67.  Urine pregnancy test is negative, hCG quant was negative. Patient is aware of these results.     She stated to me that she was "pissed off" that she felt the knives didn't work.  She states that she wishes to be dead.  She states she waits to go to sleep every night as that is the only time she is at peace.   Patient was informed of the positive i-STAT, the negative urine pregnancy test and is aware that she has an in lab serum test pending.  She denies any ingestions.  UDS is positive for benzodiazepines, assistant with her reported prescribed Xanax.  Patient was observed in the ER for 6 hours with out deterioration or change in condition.  This patient was seen as a shared visit with Dr. Vanita Panda.  We both examined the wound multiple times, and after cleaning was able to be viewed in a clean bloodless field to the base of the wound without evidence of any  vascular involvement.  Wounds do not extend into deeper tissues.  CTA was considered, however given the superficial nature of the wounds, patient denying any puncture type mechanisms, and her remaining neurovascularly intact for 6 hours decision made not to obtain CTA scan, do not suspect vascular compromise.  Given the multiple wounds will place patient on prophylactic course of keflex.   The lacerations on her left wrist were repaired with sutures, and the lacerations on her neck were closed with Dermabond, please see procedure note.  Note: Portions of this report may have been transcribed using voice recognition software. Every effort was made to ensure accuracy; however, inadvertent computerized transcription errors may be present  Meds were reordered and patient is medically clear for psychiatric evaluation and placement.  If she attempts to leave I would strongly recommend that she be IVC, however she is currently very pleasant and agreeable to treatment.  Final Clinical Impression(s) / ED Diagnoses Final diagnoses:  Suicide attempt [facility])  Laceration of neck, initial encounter  Laceration of left wrist, initial encounter  Suicide attempt by cutting of wrist [facility])    Rx / DC Orders ED Discharge Orders    None       Ollen Gross 08/20/19 2259    Carmin Muskrat, MD 08/26/19 0006

## 2019-08-21 ENCOUNTER — Encounter (HOSPITAL_COMMUNITY): Payer: Self-pay | Admitting: Behavioral Health

## 2019-08-21 ENCOUNTER — Inpatient Hospital Stay (HOSPITAL_COMMUNITY)
Admission: AD | Admit: 2019-08-21 | Discharge: 2019-08-25 | DRG: 885 | Disposition: A | Discharge status: Home / Self Care | Payer: Medicare Other | Attending: Psychiatry | Admitting: Psychiatry

## 2019-08-21 DIAGNOSIS — T1491XA Suicide attempt, initial encounter: Secondary | ICD-10-CM

## 2019-08-21 DIAGNOSIS — Z881 Allergy status to other antibiotic agents status: Secondary | ICD-10-CM | POA: Diagnosis not present

## 2019-08-21 DIAGNOSIS — F4310 Post-traumatic stress disorder, unspecified: Secondary | ICD-10-CM | POA: Diagnosis present

## 2019-08-21 DIAGNOSIS — Z853 Personal history of malignant neoplasm of breast: Secondary | ICD-10-CM | POA: Diagnosis not present

## 2019-08-21 DIAGNOSIS — S1191XA Laceration without foreign body of unspecified part of neck, initial encounter: Secondary | ICD-10-CM | POA: Diagnosis not present

## 2019-08-21 DIAGNOSIS — G629 Polyneuropathy, unspecified: Secondary | ICD-10-CM | POA: Diagnosis present

## 2019-08-21 DIAGNOSIS — I427 Cardiomyopathy due to drug and external agent: Secondary | ICD-10-CM | POA: Diagnosis present

## 2019-08-21 DIAGNOSIS — F332 Major depressive disorder, recurrent severe without psychotic features: Secondary | ICD-10-CM | POA: Diagnosis not present

## 2019-08-21 DIAGNOSIS — K5900 Constipation, unspecified: Secondary | ICD-10-CM | POA: Diagnosis not present

## 2019-08-21 DIAGNOSIS — Z96642 Presence of left artificial hip joint: Secondary | ICD-10-CM | POA: Diagnosis present

## 2019-08-21 DIAGNOSIS — I420 Dilated cardiomyopathy: Secondary | ICD-10-CM | POA: Diagnosis present

## 2019-08-21 DIAGNOSIS — Z9221 Personal history of antineoplastic chemotherapy: Secondary | ICD-10-CM

## 2019-08-21 DIAGNOSIS — M1990 Unspecified osteoarthritis, unspecified site: Secondary | ICD-10-CM | POA: Diagnosis present

## 2019-08-21 DIAGNOSIS — Z9013 Acquired absence of bilateral breasts and nipples: Secondary | ICD-10-CM

## 2019-08-21 DIAGNOSIS — F17210 Nicotine dependence, cigarettes, uncomplicated: Secondary | ICD-10-CM | POA: Diagnosis present

## 2019-08-21 DIAGNOSIS — S61519D Laceration without foreign body of unspecified wrist, subsequent encounter: Secondary | ICD-10-CM | POA: Diagnosis not present

## 2019-08-21 DIAGNOSIS — Z20822 Contact with and (suspected) exposure to covid-19: Secondary | ICD-10-CM | POA: Diagnosis present

## 2019-08-21 DIAGNOSIS — Z818 Family history of other mental and behavioral disorders: Secondary | ICD-10-CM | POA: Diagnosis not present

## 2019-08-21 DIAGNOSIS — F419 Anxiety disorder, unspecified: Secondary | ICD-10-CM

## 2019-08-21 DIAGNOSIS — Z885 Allergy status to narcotic agent status: Secondary | ICD-10-CM

## 2019-08-21 DIAGNOSIS — Z888 Allergy status to other drugs, medicaments and biological substances status: Secondary | ICD-10-CM | POA: Diagnosis not present

## 2019-08-21 DIAGNOSIS — K219 Gastro-esophageal reflux disease without esophagitis: Secondary | ICD-10-CM | POA: Diagnosis present

## 2019-08-21 DIAGNOSIS — F411 Generalized anxiety disorder: Secondary | ICD-10-CM | POA: Diagnosis present

## 2019-08-21 DIAGNOSIS — Z23 Encounter for immunization: Secondary | ICD-10-CM

## 2019-08-21 DIAGNOSIS — Z809 Family history of malignant neoplasm, unspecified: Secondary | ICD-10-CM

## 2019-08-21 DIAGNOSIS — F329 Major depressive disorder, single episode, unspecified: Secondary | ICD-10-CM | POA: Insufficient documentation

## 2019-08-21 DIAGNOSIS — X789XXD Intentional self-harm by unspecified sharp object, subsequent encounter: Secondary | ICD-10-CM | POA: Diagnosis present

## 2019-08-21 DIAGNOSIS — F3160 Bipolar disorder, current episode mixed, unspecified: Secondary | ICD-10-CM | POA: Diagnosis present

## 2019-08-21 LAB — HEPATIC FUNCTION PANEL
ALT: 22 U/L (ref 0–44)
AST: 24 U/L (ref 15–41)
Albumin: 4.2 g/dL (ref 3.5–5.0)
Alkaline Phosphatase: 62 U/L (ref 38–126)
Bilirubin, Direct: <0.1 mg/dL (ref 0.0–0.2)
Total Bilirubin: 0.4 mg/dL (ref 0.3–1.2)
Total Protein: 7 g/dL (ref 6.5–8.1)

## 2019-08-21 LAB — HEMOGLOBIN A1C
Hgb A1c MFr Bld: 4.8 % (ref 4.8–5.6)
Mean Plasma Glucose: 91.06 mg/dL

## 2019-08-21 LAB — COMPREHENSIVE METABOLIC PANEL
ALT: 22 U/L (ref 0–44)
AST: 24 U/L (ref 15–41)
Albumin: 4.3 g/dL (ref 3.5–5.0)
Alkaline Phosphatase: 62 U/L (ref 38–126)
Anion gap: 9 (ref 5–15)
BUN: 20 mg/dL (ref 6–20)
CO2: 25 mmol/L (ref 22–32)
Calcium: 9.3 mg/dL (ref 8.9–10.3)
Chloride: 108 mmol/L (ref 98–111)
Creatinine, Ser: 1.01 mg/dL — ABNORMAL HIGH (ref 0.44–1.00)
GFR calc Af Amer: >60 mL/min (ref >60)
GFR calc non Af Amer: >60 mL/min (ref >60)
Glucose, Bld: 101 mg/dL — ABNORMAL HIGH (ref 70–99)
Potassium: 3.8 mmol/L (ref 3.5–5.1)
Sodium: 142 mmol/L (ref 135–145)
Total Bilirubin: 0.4 mg/dL (ref 0.3–1.2)
Total Protein: 6.9 g/dL (ref 6.5–8.1)

## 2019-08-21 LAB — RAPID URINE DRUG SCREEN, HOSP PERFORMED
Amphetamines: NOT DETECTED
Barbiturates: NOT DETECTED
Benzodiazepines: POSITIVE — AB
Cocaine: NOT DETECTED
Opiates: NOT DETECTED
Tetrahydrocannabinol: NOT DETECTED

## 2019-08-21 LAB — GLUCOSE, CAPILLARY: Glucose-Capillary: 98 mg/dL (ref 70–99)

## 2019-08-21 LAB — URINALYSIS, COMPLETE (UACMP) WITH MICROSCOPIC
Bilirubin Urine: NEGATIVE
Glucose, UA: NEGATIVE mg/dL
Ketones, ur: NEGATIVE mg/dL
Leukocytes,Ua: NEGATIVE
Nitrite: NEGATIVE
Protein, ur: NEGATIVE mg/dL
Specific Gravity, Urine: 1.027 (ref 1.005–1.030)
pH: 5 (ref 5.0–8.0)

## 2019-08-21 LAB — LIPID PANEL
Cholesterol: 212 mg/dL — ABNORMAL HIGH (ref 0–200)
HDL: 74 mg/dL (ref >40)
LDL Cholesterol: 116 mg/dL — ABNORMAL HIGH (ref 0–99)
Total CHOL/HDL Ratio: 2.9 RATIO
Triglycerides: 110 mg/dL (ref <150)
VLDL: 22 mg/dL (ref 0–40)

## 2019-08-21 LAB — TSH: TSH: 2.167 u[IU]/mL (ref 0.350–4.500)

## 2019-08-21 LAB — MAGNESIUM: Magnesium: 2.2 mg/dL (ref 1.7–2.4)

## 2019-08-21 LAB — ETHANOL: Alcohol, Ethyl (B): <10 mg/dL (ref <10)

## 2019-08-21 MED ORDER — FOLIC ACID 1 MG PO TABS
1.0000 mg | ORAL_TABLET | Freq: Every day | ORAL | Status: DC
  Administered 2019-08-21 – 2019-08-25 (×5): 1 mg via ORAL
  Filled 2019-08-21 (×9): qty 1

## 2019-08-21 MED ORDER — NICOTINE 14 MG/24HR TD PT24
14.0000 mg | MEDICATED_PATCH | Freq: Once | TRANSDERMAL | Status: DC
  Administered 2019-08-21: 14 mg via TRANSDERMAL
  Filled 2019-08-21: qty 1

## 2019-08-21 MED ORDER — PANTOPRAZOLE SODIUM 40 MG PO TBEC
40.0000 mg | DELAYED_RELEASE_TABLET | Freq: Every day | ORAL | Status: DC
  Administered 2019-08-21 – 2019-08-25 (×5): 40 mg via ORAL
  Filled 2019-08-21 (×8): qty 1

## 2019-08-21 MED ORDER — ALUM & MAG HYDROXIDE-SIMETH 200-200-20 MG/5ML PO SUSP: 30.0000 mL | ORAL | Status: DC | PRN

## 2019-08-21 MED ORDER — ALPRAZOLAM 0.5 MG PO TABS
2.0000 mg | ORAL_TABLET | Freq: Two times a day (BID) | ORAL | Status: DC
  Administered 2019-08-21 – 2019-08-25 (×9): 2 mg via ORAL
  Filled 2019-08-21 (×9): qty 4

## 2019-08-21 MED ORDER — THIAMINE HCL 100 MG PO TABS
100.0000 mg | ORAL_TABLET | Freq: Every day | ORAL | Status: DC
  Administered 2019-08-21 – 2019-08-25 (×5): 100 mg via ORAL
  Filled 2019-08-21 (×8): qty 1

## 2019-08-21 MED ORDER — MAGNESIUM HYDROXIDE 400 MG/5ML PO SUSP
30.0000 mL | Freq: Every day | ORAL | Status: DC | PRN
  Administered 2019-08-22 – 2019-08-23 (×2): 30 mL via ORAL

## 2019-08-21 MED ORDER — GABAPENTIN 600 MG PO TABS
1200.0000 mg | ORAL_TABLET | Freq: Every day | ORAL | Status: DC
  Administered 2019-08-21 – 2019-08-24 (×4): 1200 mg via ORAL
  Filled 2019-08-21 (×6): qty 2

## 2019-08-21 MED ORDER — ACETAMINOPHEN 325 MG PO TABS
650.0000 mg | ORAL_TABLET | Freq: Four times a day (QID) | ORAL | Status: DC | PRN
  Administered 2019-08-21 – 2019-08-22 (×2): 650 mg via ORAL
  Filled 2019-08-21 (×2): qty 2

## 2019-08-21 MED ORDER — TRAZODONE HCL 50 MG PO TABS
50.0000 mg | ORAL_TABLET | Freq: Every evening | ORAL | Status: DC | PRN
  Filled 2019-08-21: qty 1

## 2019-08-21 MED ORDER — GABAPENTIN 600 MG PO TABS
1800.0000 mg | ORAL_TABLET | Freq: Every day | ORAL | Status: DC
  Administered 2019-08-21 – 2019-08-25 (×5): 1800 mg via ORAL
  Filled 2019-08-21 (×8): qty 3

## 2019-08-21 MED ORDER — NICOTINE 21 MG/24HR TD PT24
21.0000 mg | MEDICATED_PATCH | Freq: Every day | TRANSDERMAL | Status: DC
  Administered 2019-08-22 – 2019-08-25 (×4): 21 mg via TRANSDERMAL
  Filled 2019-08-21 (×7): qty 1

## 2019-08-21 MED ORDER — ALPRAZOLAM 0.5 MG PO TABS: 1.0000 mg | ORAL_TABLET | Freq: Three times a day (TID) | ORAL | Status: DC

## 2019-08-21 MED ORDER — HYDROXYZINE HCL 25 MG PO TABS
25.0000 mg | ORAL_TABLET | Freq: Three times a day (TID) | ORAL | Status: DC | PRN
  Administered 2019-08-22: 25 mg via ORAL
  Filled 2019-08-21 (×2): qty 1

## 2019-08-21 MED ORDER — MIRTAZAPINE 15 MG PO TABS
15.0000 mg | ORAL_TABLET | Freq: Every day | ORAL | Status: DC
  Administered 2019-08-21: 15 mg via ORAL
  Filled 2019-08-21 (×2): qty 1
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
## 2019-08-21 NOTE — Progress Notes (Signed)
[facility] Notes:  (Nursing/MHT/Case Management/Adjunct)  Date:  08/21/2019  Time: 2030  Type of Therapy:  wrap up group  Participation Level:  Active  Participation Quality:  Appropriate, Attentive, Sharing and Supportive  Affect:  Anxious  Cognitive:  Appropriate  Insight:  Improving  Engagement in Group:  Engaged  Modes of Intervention:  Clarification, Education and Support  Summary of Progress/Problems: Positive thinking and positive change were discussed.   Shellia Cleverly 08/21/2019, 9:19 PM

## 2019-08-21 NOTE — ED Notes (Signed)
TTS in process 
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
## 2019-08-21 NOTE — Tx Team (Signed)
Initial Treatment Plan 08/21/2019 9:55 AM Crystal [name] [id number]    PATIENT STRESSORS: Health problems Medication change or noncompliance Substance abuse Traumatic event   PATIENT STRENGTHS: Ability for insight Average or above average intelligence Communication skills General fund of knowledge Motivation for treatment/growth Supportive family/friends   PATIENT IDENTIFIED PROBLEMS: anxiety  depression  Crying spells  Suicidal ideations  Self harm             DISCHARGE CRITERIA:  Ability to meet basic life and health needs Improved stabilization in mood, thinking, and/or behavior Medical problems require only outpatient monitoring Motivation to continue treatment in a less acute level of care  PRELIMINARY DISCHARGE PLAN: Attend PHP/IOP Outpatient therapy Return to previous living arrangement  PATIENT/FAMILY INVOLVEMENT: This treatment plan has been presented to and reviewed with the patient, Crystal [name].  The patient and family have been given the opportunity to ask questions and make suggestions.  Baron Sane, RN 08/21/2019, 9:55 AM

## 2019-08-21 NOTE — ED Notes (Signed)
bfast ordered 

## 2019-08-21 NOTE — BHH Group Notes (Signed)
LCSW Group Therapy Note  Type of Therapy/Topic: Group Therapy: Six Dimensions of Wellness  Participation Level: Active  Description of Group: This group will address the concept of wellness and the six concepts of wellness: occupational, physical, social, intellectual, spiritual, and emotional. Patients will be encouraged to process areas in their lives that are out of balance and identify reasons for remaining unbalanced. Patients will be encouraged to explore ways to practice healthy habits on a daily basis to attain better physical and mental health outcomes.  Therapeutic Goals: 1. Identify aspects of wellness that they are doing well. 2. Identify aspects of wellness that they would like to improve upon. 3. Identify one action they can take to improve an aspect of wellness in their lives.    Summary of Patient Progress: Crystal [name] joined shortly after the start of group and she was an active participant in the discussion. Crystal [name] shared that she needs to take time and address all aspects of her well being. "I want to learn who I am again."   Therapeutic Modalities: Cognitive Behavioral Therapy Solution-Focused Therapy Relapse Prevention

## 2019-08-21 NOTE — BHH Counselor (Signed)
Per Felix Pacini, RN pt has been accepted to [facility] and assigned to room/bed: 302-2. Pt can come after 0800. Attending physician: Dr. Mallie Darting. Nursing report: [phone]. Updated disposition discussed with Beckie Busing, RN.    Vertell Novak, [place], [facility], [facility] Triage Specialist [phone]

## 2019-08-21 NOTE — Progress Notes (Signed)
   08/21/19 2131  Psych Admission Type (Psych Patients Only)  Admission Status Voluntary  Psychosocial Assessment  Patient Complaints Anxiety;Depression;Crying spells;Sadness  Eye Contact Fair  Facial Expression Anxious;Pained;Sullen;Sad;Worried  Affect Anxious;Depressed;Sad;Sullen  Speech Logical/coherent  Interaction Assertive  Motor Activity Slow  Appearance/Hygiene Unremarkable  Behavior Characteristics Cooperative;Anxious  Mood Depressed;Anxious;Sad  Thought Process  Coherency WDL  Content WDL  Delusions None reported or observed  Perception WDL  Hallucination None reported or observed  Judgment Poor  Confusion None  Danger to Self  Current suicidal ideation? Denies  Danger to Others  Danger to Others None reported or observed   Pt guarded and anxious. Pain 6/10 in neck and L wrist is wrapped. Pt just wants to get a good night sleep.

## 2019-08-22 MED ORDER — CARBAMAZEPINE 100 MG PO CHEW
100.0000 mg | CHEWABLE_TABLET | Freq: Three times a day (TID) | ORAL | Status: DC
  Administered 2019-08-22 – 2019-08-23 (×4): 100 mg via ORAL
  Filled 2019-08-22 (×7): qty 1

## 2019-08-22 MED ORDER — TRAZODONE HCL 100 MG PO TABS: 100.0000 mg | ORAL_TABLET | Freq: Every evening | ORAL | Status: DC | PRN

## 2019-08-22 MED ORDER — MIRTAZAPINE 30 MG PO TABS
30.0000 mg | ORAL_TABLET | Freq: Every day | ORAL | Status: DC
  Administered 2019-08-22 – 2019-08-24 (×3): 30 mg via ORAL
  Filled 2019-08-22 (×6): qty 1

## 2019-08-22 NOTE — BHH Suicide Risk Assessment (Signed)
[facility] INPATIENT:  Family/Significant Other Suicide Prevention Education  Suicide Prevention Education:  Contact Attempts: with husband, [name] [phone]) has been identified by the patient as the family member/significant other with whom the patient will be residing, and identified as the person(s) who will aid the patient in the event of a mental health crisis.  With written consent from the patient, two attempts were made to provide suicide prevention education, prior to and/or following the patient's discharge.  We were unsuccessful in providing suicide prevention education.  A suicide education pamphlet was given to the patient to share with family/significant other.  Date and time of first attempt:08/22/2019 / 2:03pm  Date and time of second attempt: Needs to be done   Crystal [name] 08/22/2019, 2:03 PM

## 2019-08-22 NOTE — Progress Notes (Signed)
[facility] MD Progress Note  08/22/2019 10:42 AM Crystal [name]  MRN:  [id number] Subjective: Patient is a 44 year old female with a reported past psychiatric history significant for bipolar disorder, posttraumatic stress disorder, generalized anxiety disorder, anxiety, insomnia, probable binge drinking disorder and concern for borderline personality disorder who was admitted on 08/21/2019 secondary to a suicide attempt by cutting her wrist and neck.  Objective: Patient is seen and examined.  Patient is a 44 year old female with the above-stated past psychiatric history who is seen in follow-up.  She is fairly labile and agitated this morning.  Her speech is somewhat slurred and we again discussed her Xanax.  I brought up the idea that we should decrease the dosage given the slurred speech, but she is terrified by the idea of decreasing the dosage of that medicine.  I did ask her if she had been overusing at home.  She denied that.  We discussed other mood stabilizer she had been on in the past.  She is terrified that she will "make me into a zombie".  We discussed whether or not she had been on Tegretol in the past.  She stated that she thought she had taken Tegretol and Thorazine together, but could not remember how the Tegretol did.  She stated that she had spoke to her husband last night and that he stated "you need to stay in the hospital for at least 30 days".  She is terrified by the idea of being in the hospital for extended period of time.  I tried to calm her, and to assure her that most likely it would not be that length of time.  Her blood pressure is mildly elevated at 122/92, and 134/95.  She is not tachycardic.  She is afebrile.  Her CIWA this morning was 4.  She only slept 3.75 hours last night.  Review of her laboratories from 4/21 showed a mildly increased creatinine at 1.01, but all the rest of her electrolytes were essentially normal.  Her lipid panel showed cholesterol elevated at 212, but  otherwise essentially normal.  Her TSH came back at 2.167.  Urinalysis from 4/21 showed rare bacteria, 6-10 epithelial cells, but 0-5 white blood cells.  Pregnancy test again as per admission.  Principal Problem: Severe recurrent major depression without psychotic features ([place]) Diagnosis: Principal Problem:   Severe recurrent major depression without psychotic features ([place]) Active Problems:   Suicide attempt ([place])  Total Time spent with patient: 20 minutes  Past Psychiatric History: See admission H&P  Past Medical History:  Past Medical History:  Diagnosis Date  . Alcohol abuse   . Anorexia nervosa   . Anxiety   . Avascular necrosis of bone of hip ([place])   . Bipolar affective disorder, mixed ([place])   . Cancer ([place])   . Chronic pain   . Depression   . Dilated cardiomyopathy secondary to drug ([place])   . DJD (degenerative joint disease)    "BONE STICKS OUT ON LEFT PART OF LOWER BACK"  . DVT of upper extremity (deep vein thrombosis) ([place]) 2014  . Edema of upper extremity   . Family history of anesthesia complication    MOTHER PONV  . Frequency of urination   . GERD (gastroesophageal reflux disease)    OCCASIONALLY TAKE ZANTAC  . History of breast cancer DX JUNE 2012 W/ RIGHT BREAST INVASIVE DUCTAL CA  STAGE IIB---  S/P BILATERAL MASTECTOMIES AND RIGHT NODE DISSECTION   ONCOLOGIST-- DR Jana Hakim--  CHEMO ENDED [place] 2012 / RADIATION  ENDED [facility] 2013--  CURRENT ON TAMOXIFEN AND ZOLADEX  . History of idiopathic seizure    DX 2008 --  LAST ONE 2012--  NO ISSUES SINCE AND NO MEDS. -- PT STATES DOCTORS FELT IT WAS STRESS RELATED DUE TO HEALTH ISSUES  . Hot flashes due to tamoxifen   . Hx MRSA infection 03/28/11   Skin  . Left ovarian cyst   . Migraines   . Neuropathy   . Nocturia   . Right lower quadrant abdominal pain   . Seizures ([place])    2016 last seizure   . Status post chemotherapy    docetaxel/carboplatin/trastuzumab  . Strains to urinate   . Urgency of urination     Past  Surgical History:  Procedure Laterality Date  . BIOPSY BREAST  10/14/2010   right needle core biopsy  . BREAST SURGERY  11/16/2010   bilateral mastectomy+ right axillary node dissection,T1cN1a, Her2+,ERPR+  (right breast invasive ductal carcinoma)  . CYSTO WITH HYDRODISTENSION N/A 07/23/2012   Procedure: CYSTOSCOPY/HYDRODISTENSION instillation of marcaine and pyridium;  Surgeon: Reece Packer, MD;  Location: [facility];  Service: Urology;  Laterality: N/A;  INSTILLATION    . HARDWARE REMOVAL Right 12/03/2018   Procedure: Right tibia screw removal;  Surgeon: Gaynelle Arabian, MD;  Location: [facility];  Service: Orthopedics;  Laterality: Right;  37mn  . I & D EXTREMITY  09/26/2011   Procedure: IRRIGATION AND DEBRIDEMENT EXTREMITY;  Surgeon: KTennis Must MD;  Location: [facility];  Service: Orthopedics;  Laterality: Right;  I&D right hand cat bite wound  . ORIF TIBIA PLATEAU Right 11/27/2017   Procedure: OPEN REDUCTION INTERNAL FIXATION (ORIF) TIBIAL PLATEAU FRACTURE;  Surgeon: AGaynelle Arabian MD;  Location: [facility];  Service: Orthopedics;  Laterality: Right;  . PORTACATH PLACEMENT  11/16/2010   placement of left subclavian port NOW REMOVED  . right knee torn meniscus surgery     . TOTAL HIP ARTHROPLASTY Left 06/13/2018   Procedure: TOTAL HIP ARTHROPLASTY ANTERIOR APPROACH;  Surgeon: AGaynelle Arabian MD;  Location[facility];  Service: Orthopedics;  Laterality: Left;  1067m  . TRANSTHORACIC ECHOCARDIOGRAM  05-28-2012   LVF NORMAL/  EF 55-60%  . WISDOM TOOTH EXTRACTION     Family History:  Family History  Problem Relation Age of Onset  . Cancer Maternal Grandfather   . Mental illness Brother   . Mental illness Son    Family Psychiatric  History: See admission H&P Social History:  Social History   Substance and Sexual Activity  Alcohol Use Yes   Comment: occ. binge drinker monthly or less     Social History   Substance and Sexual Activity  Drug Use No   Comment: [place]    Social History   Socioeconomic History  . Marital status: Married    Spouse name: CaGlendell Docker. Number of children: 1  . Years of education: HS  . Highest education level: Not on file  Occupational History    Employer: NOT EMPLOYED  Tobacco Use  . Smoking status: Current Every Day Smoker    Packs/day: 1.00    Years: 20.00    Pack years: 20.00    Types: Cigarettes  . Smokeless tobacco: Never Used  Substance and Sexual Activity  . Alcohol use: Yes    Comment: occ. binge drinker monthly or less  . Drug use: No    Comment: LAST [place]  . Sexual activity: Yes  Partners: Male    Birth control/protection: Injection    Comment: "zylodex" to "shut down ovaries"  Other Topics Concern  . Not on file  Social History Narrative   Married 4 years   1 son age 33   Unemployed      Grandfather died unknown malignacy   No family h/o breast, ovarian or colon ca      Menarche age 68      Patient lives at home with spouse.   Caffeine Use: Up to 1 pot of coffee daily                  Social Determinants of Health   Financial Resource Strain:   . Difficulty of Paying Living Expenses:   Food Insecurity:   . Worried About Charity fundraiser in the Last Year:   . Arboriculturist in the Last Year:   Transportation Needs:   . Film/video editor (Medical):   Marland Kitchen Lack of Transportation (Non-Medical):   Physical Activity:   . Days of Exercise per Week:   . Minutes of Exercise per Session:   Stress:   . Feeling of Stress :   Social Connections:   . Frequency of Communication with Friends and Family:   . Frequency of Social Gatherings with Friends and Family:   . Attends Religious Services:   . Active Member of Clubs or Organizations:   . Attends Archivist Meetings:   Marland Kitchen Marital Status:    Additional Social History:                         Sleep: Poor  Appetite:  Fair  Current Medications: Current  Facility-Administered Medications  Medication Dose Route Frequency Provider Last Rate Last Admin  . acetaminophen (TYLENOL) tablet 650 mg  650 mg Oral Q6H PRN Sharma Covert, MD   650 mg at 08/22/19 [phone]  . ALPRAZolam Duanne Moron) tablet 2 mg  2 mg Oral BID Sharma Covert, MD   2 mg at 08/22/19 0819  . alum & mag hydroxide-simeth (MAALOX/MYLANTA) 200-200-20 MG/5ML suspension 30 mL  30 mL Oral Q4H PRN Sharma Covert, MD      . carbamazepine (TEGRETOL) chewable tablet 100 mg  100 mg Oral TID Sharma Covert, MD      . folic acid (FOLVITE) tablet 1 mg  1 mg Oral Daily Sharma Covert, MD   1 mg at 08/22/19 0820  . gabapentin (NEURONTIN) tablet 1,200 mg  1,200 mg Oral QHS Sharma Covert, MD   1,200 mg at 08/21/19 2056  . gabapentin (NEURONTIN) tablet 1,800 mg  1,800 mg Oral Daily Sharma Covert, MD   1,800 mg at 08/22/19 0818  . hydrOXYzine (ATARAX/VISTARIL) tablet 25 mg  25 mg Oral TID PRN Sharma Covert, MD   25 mg at 08/22/19 0939  . magnesium hydroxide (MILK OF MAGNESIA) suspension 30 mL  30 mL Oral Daily PRN Sharma Covert, MD      . mirtazapine (REMERON) tablet 30 mg  30 mg Oral QHS Sharma Covert, MD      . nicotine (NICODERM CQ - dosed in mg/24 hours) patch 21 mg  21 mg Transdermal Daily Sharma Covert, MD   21 mg at 08/22/19 0817  . pantoprazole (PROTONIX) EC tablet 40 mg  40 mg Oral Daily Sharma Covert, MD   40 mg at 08/22/19 0818  . thiamine tablet 100 mg  100 mg Oral Daily Sharma Covert, MD   100 mg at 08/22/19 0818  . traZODone (DESYREL) tablet 50 mg  50 mg Oral QHS PRN Sharma Covert, MD       Facility-Administered Medications Ordered in Other Encounters  Medication Dose Route Frequency Provider Last Rate Last Admin  . goserelin (ZOLADEX) injection 10.8 mg  10.8 mg Subcutaneous Q90 days [name], Virgie Dad, MD   10.8 mg at 02/02/16 1501    Lab Results:  Results for orders placed or performed during the hospital encounter of 08/21/19  (from the past 48 hour(s))  Urinalysis, Complete w Microscopic     Status: Abnormal   Collection Time: 08/21/19 11:17 AM  Result Value Ref Range   Color, Urine YELLOW YELLOW   APPearance HAZY (A) CLEAR   Specific Gravity, Urine 1.027 1.005 - 1.030   pH 5.0 5.0 - 8.0   Glucose, UA NEGATIVE NEGATIVE mg/dL   Hgb urine dipstick MODERATE (A) NEGATIVE   Bilirubin Urine NEGATIVE NEGATIVE   Ketones, ur NEGATIVE NEGATIVE mg/dL   Protein, ur NEGATIVE NEGATIVE mg/dL   Nitrite NEGATIVE NEGATIVE   Leukocytes,Ua NEGATIVE NEGATIVE   RBC / HPF 6-10 0 - 5 RBC/hpf   WBC, UA 0-5 0 - 5 WBC/hpf   Bacteria, UA RARE (A) NONE SEEN   Squamous Epithelial / LPF 6-10 0 - 5   Mucus PRESENT     Comment: Performed at [facility], [place] [address] [place], [place] [address]  Urine rapid drug screen (hosp performed)not at [facility]     Status: Abnormal   Collection Time: 08/21/19 11:17 AM  Result Value Ref Range   Opiates NONE DETECTED NONE DETECTED   Cocaine NONE DETECTED NONE DETECTED   Benzodiazepines POSITIVE (A) NONE DETECTED   Amphetamines NONE DETECTED NONE DETECTED   Tetrahydrocannabinol NONE DETECTED NONE DETECTED   Barbiturates NONE DETECTED NONE DETECTED    Comment: (NOTE) DRUG SCREEN FOR MEDICAL PURPOSES ONLY.  IF CONFIRMATION IS NEEDED FOR ANY PURPOSE, NOTIFY LAB WITHIN 5 DAYS. LOWEST DETECTABLE LIMITS FOR URINE DRUG SCREEN Drug Class                     Cutoff (ng/mL) Amphetamine and metabolites    1000 Barbiturate and metabolites    200 Benzodiazepine                 950 Tricyclics and metabolites     300 Opiates and metabolites        300 Cocaine and metabolites        300 THC                            50 Performed at [facility], [place] [address], [place], [place] [address]   Glucose, capillary     Status: None   Collection Time: 08/21/19 11:19 AM  Result Value Ref Range   Glucose-Capillary 98 70 - 99 mg/dL    Comment: Glucose reference range  applies only to samples taken after fasting for at least 8 hours.   Comment 1 Notify RN    Comment 2 Document in Chart   Comprehensive metabolic panel     Status: Abnormal   Collection Time: 08/21/19  6:39 PM  Result Value Ref Range   Sodium 142 135 - 145 mmol/L   Potassium 3.8 3.5 - 5.1 mmol/L   Chloride 108 98 - 111 mmol/L   CO2  25 22 - 32 mmol/L   Glucose, Bld 101 (H) 70 - 99 mg/dL    Comment: Glucose reference range applies only to samples taken after fasting for at least 8 hours.   BUN 20 6 - 20 mg/dL   Creatinine, Ser 1.01 (H) 0.44 - 1.00 mg/dL   Calcium 9.3 8.9 - 10.3 mg/dL   Total Protein 6.9 6.5 - 8.1 g/dL   Albumin 4.3 3.5 - 5.0 g/dL   AST 24 15 - 41 U/L   ALT 22 0 - 44 U/L   Alkaline Phosphatase 62 38 - 126 U/L   Total Bilirubin 0.4 0.3 - 1.2 mg/dL   GFR calc non Af Amer >60 >60 mL/min   GFR calc Af Amer >60 >60 mL/min   Anion gap 9 5 - 15    Comment: Performed at [facility], [place] [address], [place], [place] [address]  Ethanol     Status: None   Collection Time: 08/21/19  6:39 PM  Result Value Ref Range   Alcohol, Ethyl (B) <10 <10 mg/dL    Comment: (NOTE) Lowest detectable limit for serum alcohol is 10 mg/dL. For medical purposes only. Performed at [facility], [place] [address] [place], [place] [address]   Hemoglobin A1c     Status: None   Collection Time: 08/21/19  6:39 PM  Result Value Ref Range   Hgb A1c MFr Bld 4.8 4.8 - 5.6 %    Comment: (NOTE) Pre diabetes:          5.7%-6.4% Diabetes:              >6.4% Glycemic control for   <7.0% adults with diabetes    Mean Plasma Glucose 91.06 mg/dL    Comment: Performed at [place] [address], [place], [place] [address]  Hepatic function panel     Status: None   Collection Time: 08/21/19  6:39 PM  Result Value Ref Range   Total Protein 7.0 6.5 - 8.1 g/dL   Albumin 4.2 3.5 - 5.0 g/dL   AST 24 15 - 41 U/L   ALT 22 0 - 44 U/L   Alkaline Phosphatase 62 38  - 126 U/L   Total Bilirubin 0.4 0.3 - 1.2 mg/dL   Bilirubin, Direct <0.1 0.0 - 0.2 mg/dL   Indirect Bilirubin NOT CALCULATED 0.3 - 0.9 mg/dL    Comment: Performed at [facility], [place] [address], [place], [place] [address]  Lipid panel     Status: Abnormal   Collection Time: 08/21/19  6:39 PM  Result Value Ref Range   Cholesterol 212 (H) 0 - 200 mg/dL   Triglycerides 110 <150 mg/dL   HDL 74 >40 mg/dL   Total CHOL/HDL Ratio 2.9 RATIO   VLDL 22 0 - 40 mg/dL   LDL Cholesterol 116 (H) 0 - 99 mg/dL    Comment:        Total Cholesterol/HDL:CHD Risk Coronary Heart Disease Risk Table                     Men   Women  1/2 Average Risk   3.4   3.3  Average Risk       5.0   4.4  2 X Average Risk   9.6   7.1  3 X Average Risk  23.4   11.0        Use the calculated Patient Ratio above and the CHD Risk Table to  determine the patient's CHD Risk.        ATP III CLASSIFICATION (LDL):  <100     mg/dL   Optimal  100-129  mg/dL   Near or Above                    Optimal  130-159  mg/dL   Borderline  160-189  mg/dL   High  >190     mg/dL   Very High Performed at [place] [address] [place], [place] [address]   Magnesium     Status: None   Collection Time: 08/21/19  6:39 PM  Result Value Ref Range   Magnesium 2.2 1.7 - 2.4 mg/dL    Comment: Performed at [facility], [place] [address] [place], [place] [address]  TSH     Status: None   Collection Time: 08/21/19  6:39 PM  Result Value Ref Range   TSH 2.167 0.350 - 4.500 uIU/mL    Comment: Performed by a 3rd Generation assay with a functional sensitivity of <=0.01 uIU/mL. Performed at [facility], [place] [address], [place], [place] [address]     Blood Alcohol level:  Lab Results  Component Value Date   ETH <10 08/21/2019   ETH <10 85/63/1497    Metabolic Disorder Labs: Lab Results  Component Value Date   HGBA1C 4.8 08/21/2019   MPG 91.06 08/21/2019    No results found for: PROLACTIN Lab Results  Component Value Date   CHOL 212 (H) 08/21/2019   TRIG 110 08/21/2019   HDL 74 08/21/2019   CHOLHDL 2.9 08/21/2019   VLDL 22 08/21/2019   LDLCALC 116 (H) 08/21/2019    Physical Findings: AIMS: Facial and Oral Movements Muscles of Facial Expression: None, normal Lips and Perioral Area: None, normal Jaw: None, normal Tongue: None, normal,Extremity Movements Upper (arms, wrists, hands, fingers): None, normal Lower (legs, knees, ankles, toes): None, normal, Trunk Movements Neck, shoulders, hips: None, normal, Overall Severity Severity of abnormal movements (highest score from questions above): None, normal Incapacitation due to abnormal movements: None, normal Patient's awareness of abnormal movements (rate only patient's report): No Awareness, Dental Status Current problems with teeth and/or dentures?: No Does patient usually wear dentures?: No  CIWA:  CIWA-Ar Total: 4 COWS:     Musculoskeletal: Strength & Muscle Tone: within normal limits Gait & Station: normal Patient leans: N/A  Psychiatric Specialty Exam: Physical Exam  Nursing note and vitals reviewed. Constitutional: She is oriented to person, place, and time. She appears well-developed and well-nourished.  HENT:  Head: Normocephalic and atraumatic.  Respiratory: Effort normal.  Neurological: She is alert and oriented to person, place, and time.    Review of Systems  Blood pressure (!) 134/95, pulse 85, temperature 98 F (36.7 C), temperature source Oral, resp. rate 16, height _0  (1.626 m), weight 55 kg, SpO2 100 %.Body mass index is 20.81 kg/m.  General Appearance: Disheveled  Eye Contact:  Fair  Speech:  Slurred  Volume:  Increased  Mood:  Anxious, Dysphoric and Irritable  Affect:  Labile  Thought Process:  Coherent and Descriptions of Associations: Circumstantial  Orientation:  Full (Time, Place, and Person)  Thought Content:  Rumination  Suicidal  Thoughts:  Yes.  without intent/plan  Homicidal Thoughts:  No  Memory:  Immediate;   Fair Recent;   Fair Remote;   Fair  Judgement:  Impaired  Insight:  Lacking  Psychomotor Activity:  Increased  Concentration:  Concentration: Fair  and Attention Span: Fair  Recall:  [employer] of Knowledge:  Fair  Language:  Fair  Akathisia:  Negative  Handed:  Right  AIMS (if indicated):     Assets:  Desire for Improvement Housing Resilience  ADL's:  Intact  Cognition:  WNL  Sleep:  Number of Hours: 3.75     Treatment Plan Summary: Daily contact with patient to assess and evaluate symptoms and progress in treatment, Medication management and Plan : Patient is seen and examined.  Patient is a 44 year old female with the above-stated past psychiatric history who is seen in follow-up.  Diagnosis: #1 unspecified bipolar disorder, #2 posttraumatic stress disorder, #3 generalized anxiety disorder, #4 anorexia, #5 insomnia, #6 probable binge drinking disorder, and #7 unspecified personality disorder, #8 history of breast cancer  Patient is seen in follow-up.  She is very anxious, med seeking, labile and her speech is slurred.  It could be that the speech is secondary to either the benzodiazepines or the Neurontin at its massive dosage.  I am going to add Tegretol 100 mg p.o. 3 times daily for mood stability to decrease her lability.  If that successful I will try and reduce her gabapentin dosage.  I have discussed decreasing the Xanax and she is completely against that.  I asked her if she is taking more Xanax at home than the 1 mg 4 times a day or 2 mg twice a day and she denied that.  She also received the mirtazapine 15 mg p.o. nightly last night.  I will increase that to 30 mg a day.  If that appears to be the source of worsening mania I will stop that tomorrow.  No other changes in her current medications.  1.  Continue alprazolam 2 mg p.o. twice daily for anxiety. 2.  Start Tegretol 100 mg p.o. 3  times daily for mood stability. 3.  Continue folic acid 1 mg p.o. daily for nutritional supplementation. 4.  Continue Neurontin 1200 mg p.o. nightly and 1800 mg p.o. daily for mood stability, chronic pain and sleep. 5.  Continue hydroxyzine 25 mg p.o. 3 times daily as needed anxiety. 6.  Increase mirtazapine to 30 mg p.o. nightly for anxiety and depression. 7.  Continue Protonix 40 mg p.o. daily for gastric protection. 8.  Continue thiamine 100 mg p.o. daily for nutritional supplementation. 9.  Increase trazodone to 100 mg p.o. nightly as needed insomnia. 10.  Disposition planning-in progress. Sharma Covert, MD 08/22/2019, 10:42 AM

## 2019-08-23 MED ORDER — CARBAMAZEPINE 100 MG PO CHEW
200.0000 mg | CHEWABLE_TABLET | Freq: Three times a day (TID) | ORAL | Status: DC
  Administered 2019-08-23 – 2019-08-25 (×6): 200 mg via ORAL
  Filled 2019-08-23 (×11): qty 2

## 2019-08-23 MED ORDER — BISACODYL 5 MG PO TBEC
10.0000 mg | DELAYED_RELEASE_TABLET | Freq: Once | ORAL | Status: AC
  Administered 2019-08-23: 10 mg via ORAL
  Filled 2019-08-23: qty 2

## 2019-08-23 MED ORDER — HYDROXYZINE HCL 50 MG PO TABS
50.0000 mg | ORAL_TABLET | Freq: Every day | ORAL | Status: DC
  Administered 2019-08-23 – 2019-08-24 (×2): 50 mg via ORAL
  Filled 2019-08-23 (×4): qty 1

## 2019-08-23 MED ORDER — SENNA 8.6 MG PO TABS
1.0000 | ORAL_TABLET | Freq: Every evening | ORAL | Status: DC | PRN
  Filled 2019-08-23: qty 1

## 2019-08-23 MED ORDER — BISACODYL 10 MG RE SUPP
10.0000 mg | Freq: Once | RECTAL | Status: AC
  Administered 2019-08-23: 14:00:00 10 mg via RECTAL
  Filled 2019-08-23: qty 1

## 2019-08-23 MED ORDER — TRAZODONE HCL 100 MG PO TABS
100.0000 mg | ORAL_TABLET | Freq: Every day | ORAL | Status: DC
  Administered 2019-08-23 – 2019-08-24 (×2): 100 mg via ORAL
  Filled 2019-08-23 (×4): qty 1

## 2019-08-23 MED ORDER — POLYETHYLENE GLYCOL 3350 17 G PO PACK
17.0000 g | PACK | Freq: Every day | ORAL | Status: DC
  Administered 2019-08-23 – 2019-08-25 (×3): 17 g via ORAL
  Filled 2019-08-23 (×5): qty 1

## 2019-08-23 NOTE — Tx Team (Signed)
Interdisciplinary Treatment and Diagnostic Plan Update  08/23/2019 Time of Session: 9:10am Crystal [name] MRN: [id number]  Principal Diagnosis: Severe recurrent major depression without psychotic features [name])  Secondary Diagnoses: Principal Problem:   Severe recurrent major depression without psychotic features ([name]) Active Problems:   Suicide attempt ([name])   Current Medications:  Current Facility-Administered Medications  Medication Dose Route Frequency Provider Last Rate Last Admin  . acetaminophen (TYLENOL) tablet 650 mg  650 mg Oral Q6H PRN Sharma Covert, MD   650 mg at 08/22/19 [phone]  . ALPRAZolam Duanne Moron) tablet 2 mg  2 mg Oral BID Sharma Covert, MD   2 mg at 08/23/19 0729  . alum & mag hydroxide-simeth (MAALOX/MYLANTA) 200-200-20 MG/5ML suspension 30 mL  30 mL Oral Q4H PRN Sharma Covert, MD      . carbamazepine (TEGRETOL) chewable tablet 100 mg  100 mg Oral TID Sharma Covert, MD   100 mg at 08/23/19 0730  . folic acid (FOLVITE) tablet 1 mg  1 mg Oral Daily Sharma Covert, MD   1 mg at 08/23/19 0731  . gabapentin (NEURONTIN) tablet 1,200 mg  1,200 mg Oral QHS Sharma Covert, MD   1,200 mg at 08/22/19 2159  . gabapentin (NEURONTIN) tablet 1,800 mg  1,800 mg Oral Daily Sharma Covert, MD   1,800 mg at 08/23/19 0731  . hydrOXYzine (ATARAX/VISTARIL) tablet 25 mg  25 mg Oral TID PRN Sharma Covert, MD   25 mg at 08/22/19 0939  . magnesium hydroxide (MILK OF MAGNESIA) suspension 30 mL  30 mL Oral Daily PRN Sharma Covert, MD   30 mL at 08/23/19 0737  . mirtazapine (REMERON) tablet 30 mg  30 mg Oral QHS Sharma Covert, MD   30 mg at 08/22/19 2159  . nicotine (NICODERM CQ - dosed in mg/24 hours) patch 21 mg  21 mg Transdermal Daily Sharma Covert, MD   21 mg at 08/23/19 0732  . pantoprazole (PROTONIX) EC tablet 40 mg  40 mg Oral Daily Sharma Covert, MD   40 mg at 08/23/19 0731  . polyethylene glycol (MIRALAX / GLYCOLAX) packet 17 g  17 g  Oral Daily Sharma Covert, MD   17 g at 08/23/19 0929  . senna (SENOKOT) tablet 8.6 mg  1 tablet Oral QHS PRN Sharma Covert, MD      . thiamine tablet 100 mg  100 mg Oral Daily Sharma Covert, MD   100 mg at 08/23/19 0731  . traZODone (DESYREL) tablet 100 mg  100 mg Oral QHS PRN Sharma Covert, MD       Facility-Administered Medications Ordered in Other Encounters  Medication Dose Route Frequency Provider Last Rate Last Admin  . goserelin (ZOLADEX) injection 10.8 mg  10.8 mg Subcutaneous Q90 days [name], Virgie Dad, MD   10.8 mg at 02/02/16 1501   PTA Medications: Medications Prior to Admission  Medication Sig Dispense Refill Last Dose  . albuterol (PROVENTIL HFA;VENTOLIN HFA) 108 (90 Base) MCG/ACT inhaler Inhale 2 puffs into the lungs every 4 (four) hours as needed for wheezing or shortness of breath.   0   . ALPRAZolam (XANAX) 1 MG tablet TAKE 1 TABLET BY MOUTH 3 TIMES A DAY AS NEEDED FOR ANXIETY (Patient taking differently: Take 2 mg by mouth in the morning and at bedtime. ) 90 tablet 1   . amphetamine-dextroamphetamine (ADDERALL XR) 30 MG 24 hr capsule Take 30 mg by mouth every morning.     Marland Kitchen  diphenhydrAMINE-zinc acetate (BENADRYL EXTRA STRENGTH) cream Apply 1 application topically 3 (three) times daily as needed for itching. 28.4 g 0   . esomeprazole (NEXIUM) 20 MG capsule Take 20 mg by mouth daily before breakfast.      . Eszopiclone 3 MG TABS Take 3 mg by mouth at bedtime as needed (for sleep).      . gabapentin (NEURONTIN) 600 MG tablet Take 1,200-1,800 mg by mouth See admin instructions. Take 1,800 mg by mouth in the morning and 1,200 mg at bedtime     . HYDROcodone-acetaminophen (NORCO) 5-325 MG tablet Take 1 tablet by mouth every 6 (six) hours as needed for moderate pain. (Patient not taking: Reported on 08/20/2019) 20 tablet 0     Patient Stressors: Health problems Medication change or noncompliance Substance abuse Traumatic event  Patient Strengths: Ability for  insight Average or above average intelligence Communication skills General fund of knowledge Motivation for treatment/growth Supportive family/friends  Treatment Modalities: Medication Management, Group therapy, Case management,  1 to 1 session with clinician, Psychoeducation, Recreational therapy.   Physician Treatment Plan for Primary Diagnosis: Severe recurrent major depression without psychotic features ([name]) Long Term Goal(s): Improvement in symptoms so as ready for discharge Improvement in symptoms so as ready for discharge   Short Term Goals: Ability to identify changes in lifestyle to reduce recurrence of condition will improve Ability to verbalize feelings will improve Ability to disclose and discuss suicidal ideas Ability to demonstrate self-control will improve Ability to identify and develop effective coping behaviors will improve Ability to maintain clinical measurements within normal limits will improve Compliance with prescribed medications will improve Ability to identify triggers associated with substance abuse/mental health issues will improve Ability to identify changes in lifestyle to reduce recurrence of condition will improve Ability to verbalize feelings will improve Ability to disclose and discuss suicidal ideas Ability to demonstrate self-control will improve Ability to identify and develop effective coping behaviors will improve Ability to maintain clinical measurements within normal limits will improve Compliance with prescribed medications will improve Ability to identify triggers associated with substance abuse/mental health issues will improve  Medication Management: Evaluate patient's response, side effects, and tolerance of medication regimen.  Therapeutic Interventions: 1 to 1 sessions, Unit Group sessions and Medication administration.  Evaluation of Outcomes: Not Met  Physician Treatment Plan for Secondary Diagnosis: Principal Problem:    Severe recurrent major depression without psychotic features ([name]) Active Problems:   Suicide attempt [name] ([name]  Long Term Goal(s): Improvement in symptoms so as ready for discharge Improvement in symptoms so as ready for discharge   Short Term Goals: Ability to identify changes in lifestyle to reduce recurrence of condition will improve Ability to verbalize feelings will improve Ability to disclose and discuss suicidal ideas Ability to demonstrate self-control will improve Ability to identify and develop effective coping behaviors will improve Ability to maintain clinical measurements within normal limits will improve Compliance with prescribed medications will improve Ability to identify triggers associated with substance abuse/mental health issues will improve Ability to identify changes in lifestyle to reduce recurrence of condition will improve Ability to verbalize feelings will improve Ability to disclose and discuss suicidal ideas Ability to demonstrate self-control will improve Ability to identify and develop effective coping behaviors will improve Ability to maintain clinical measurements within normal limits will improve Compliance with prescribed medications will improve Ability to identify triggers associated with substance abuse/mental health issues will improve     Medication Management: Evaluate patient's response, side effects, and tolerance of medication  regimen.  Therapeutic Interventions: 1 to 1 sessions, Unit Group sessions and Medication administration.  Evaluation of Outcomes: Not Met   RN Treatment Plan for Primary Diagnosis: Severe recurrent major depression without psychotic features ([place]) Long Term Goal(s): Knowledge of disease and therapeutic regimen to maintain health will improve  Short Term Goals: Ability to participate in decision making will improve, Ability to verbalize feelings will improve, Ability to disclose and discuss suicidal ideas, Ability to  identify and develop effective coping behaviors will improve and Compliance with prescribed medications will improve  Medication Management: RN will administer medications as ordered by provider, will assess and evaluate patient's response and provide education to patient for prescribed medication. RN will report any adverse and/or side effects to prescribing provider.  Therapeutic Interventions: 1 on 1 counseling sessions, Psychoeducation, Medication administration, Evaluate responses to treatment, Monitor vital signs and CBGs as ordered, Perform/monitor CIWA, COWS, AIMS and Fall Risk screenings as ordered, Perform wound care treatments as ordered.  Evaluation of Outcomes: Not Met   LCSW Treatment Plan for Primary Diagnosis: Severe recurrent major depression without psychotic features ([place]) Long Term Goal(s): Safe transition to appropriate next level of care at discharge, Engage patient in therapeutic group addressing interpersonal concerns.  Short Term Goals: Engage patient in aftercare planning with referrals and resources  Therapeutic Interventions: Assess for all discharge needs, 1 to 1 time with Social worker, Explore available resources and support systems, Assess for adequacy in community support network, Educate family and significant other(s) on suicide prevention, Complete Psychosocial Assessment, Interpersonal group therapy.  Evaluation of Outcomes: Not Met   Progress in Treatment: Attending groups: Yes. Participating in groups: Yes. Taking medication as prescribed: Yes. Toleration medication: Yes. Family/Significant other contact made: No, will contact:  the patient's husband Patient understands diagnosis: Yes. Discussing patient identified problems/goals with staff: Yes. Medical problems stabilized or resolved: Yes. Denies suicidal/homicidal ideation: Yes. Issues/concerns per patient self-inventory: No. Other:   New problem(s) identified: None   New Short Term/Long  Term Goal(s):  medication stabilization, elimination of SI thoughts, development of comprehensive mental wellness plan.    Patient Goals:  "To be able to cope correctly. I need to learn how to cope with my stress and anxiety"   Discharge Plan or Barriers: Patient recently admitted. Patient plans to return home with outpatient follow up with her current providers at [place]. CSW will continue to follow and assess for appropriate referrals and possible discharge planning.    Reason for Continuation of Hospitalization: Anxiety Depression Medication stabilization Suicidal ideation  Estimated Length of Stay: 3-5 days   Attendees: Patient: Crystal [name]  08/23/2019 10:39 AM  Physician: Dr. Myles Lipps, MD 08/23/2019 10:39 AM  Nursing:  08/23/2019 10:39 AM  RN Care Manager: 08/23/2019 10:39 AM  Social Worker: Radonna Ricker, LCSW 08/23/2019 10:39 AM  Recreational Therapist:  08/23/2019 10:39 AM  Other:  08/23/2019 10:39 AM  Other:  08/23/2019 10:39 AM  Other: 08/23/2019 10:39 AM    Scribe for Treatment Team: Marylee Floras, [place] 08/23/2019 10:39 AM

## 2019-08-23 NOTE — Progress Notes (Signed)
Recreation Therapy Notes  Date:  4.23.21 Time: 0930 Location: [address] Group Room  Group Topic: Stress Management  Goal Area(s) Addresses:  Patient will identify positive stress management techniques. Patient will identify benefits of using stress management post d/c.  Behavioral Response:  Engaged  Intervention: Stress Management  Activity: Meditation.  LRT played a meditation that focused on taking on the characteristics of a mountain.  Patients were to listen and follow along as meditation played to engage in activity.  Education:  Stress Management, Discharge Planning.   Education Outcome: Acknowledges Education  Clinical Observations/Feedback:  Pt attended and participated in activity.     Victorino Sparrow, LRT/CTRS         Victorino Sparrow A 08/23/2019 11:23 AM

## 2019-08-23 NOTE — Progress Notes (Signed)
[facility] MD Progress Note  08/23/2019 12:36 PM Crystal [name]  MRN:  [id number]  Subjective: Crystal [name] reports, "I'm here because I have been dealing with the ups & downs of life for a long time. I have been dealing with all kinds of mental health & medical issues. I have been trying to cope & tough it out, it seems like nothing is working. I realized that I was unable to take it any more, I tried to end it myself. I attempted to cut my throat using 2 separate knives. I did not sleep well last night. I have not had a bowel movement in a long time. My mind is all over the place because I have not been taking my Adderall. I'm trying, it just seems no one is listening to me".   Objective:Patient is a 44 year old female with a reported past psychiatric history significant for bipolar disorder, posttraumatic stress disorder, generalized anxiety disorder, anxiety, insomnia, probable binge drinking disorder and concern for borderline personality disorder who was admitted on 08/21/2019 secondary to a suicide attempt by cutting her wrist and neck. Crystal [name] is seen, chart reviewed. The chart findings discussed with the treatment team. She presents alert, oriented & aware of situation. She is visible on the unit, attending group sessions. Crystal [name] expressed during this follow-up care assessment her struggles with mental illness & other medical issues. She expressed the feelings she has about not being heard or understood by her family. She continues to express that she is still mentally not where she needs to be. She is complaining of her mind not steady or at peace because she believed it is because she is not taking her Adderall here. She is complaining of not sleeping well at night as well as having problems with constipation. To help her sleep a lot better, her Trazodone has been changed from prn to routinely at bed time. Vistaril 50 mg Q hs was added to argument the Trazodone. A dulcolax 10 mg po & 10 suppository has been  ordered 1 time only to help eliminate the constipation. Patient is encouraged to continue to take her medications as recommended. She currently denies any SIHI, AVH, delusional thoughts or paranoia. She does not appear to be responding to any internal stimuli. She is in agreement to continue current plan of care as already in progress.  (Per previous notes): Patient is a 44 year old female with the above-stated past psychiatric history who is seen in follow-up.  She is fairly labile and agitated this morning.  Her speech is somewhat slurred and we again discussed her Xanax.  I brought up the idea that we should decrease the dosage given the slurred speech, but she is terrified by the idea of decreasing the dosage of that medicine.  I did ask her if she had been overusing at home.  She denied that.  We discussed other mood stabilizer she had been on in the past.  She is terrified that she will "make me into a zombie".  We discussed whether or not she had been on Tegretol in the past.  She stated that she thought she had taken Tegretol and Thorazine together, but could not remember how the Tegretol did.  She stated that she had spoke to her husband last night and that he stated "you need to stay in the hospital for at least 30 days".  She is terrified by the idea of being in the hospital for extended period of time.  I tried to calm her, and to  assure her that most likely it would not be that length of time.  Her blood pressure is mildly elevated at 122/92, and 134/95.  She is not tachycardic.  She is afebrile.  Her CIWA this morning was 4.  She only slept 3.75 hours last night.  Review of her laboratories from 4/21 showed a mildly increased creatinine at 1.01, but all the rest of her electrolytes were essentially normal.  Her lipid panel showed cholesterol elevated at 212, but otherwise essentially normal.  Her TSH came back at 2.167.  Urinalysis from 4/21 showed rare bacteria, 6-10 epithelial cells, but 0-5 white  blood cells.  Pregnancy test again as per admission.  Principal Problem: Severe recurrent major depression without psychotic features ([place]) Diagnosis: Principal Problem:   Severe recurrent major depression without psychotic features ([place]) Active Problems:   Suicide attempt ([place])  Total Time spent with patient: 15 minutes  Past Psychiatric History: See admission H&P  Past Medical History:  Past Medical History:  Diagnosis Date  . Alcohol abuse   . Anorexia nervosa   . Anxiety   . Avascular necrosis of bone of hip ([place])   . Bipolar affective disorder, mixed ([place])   . Cancer ([place])   . Chronic pain   . Depression   . Dilated cardiomyopathy secondary to drug ([place])   . DJD (degenerative joint disease)    "BONE STICKS OUT ON LEFT PART OF LOWER BACK"  . DVT of upper extremity (deep vein thrombosis) ([place]) 2014  . Edema of upper extremity   . Family history of anesthesia complication    MOTHER PONV  . Frequency of urination   . GERD (gastroesophageal reflux disease)    OCCASIONALLY TAKE ZANTAC  . History of breast cancer DX JUNE 2012 W/ RIGHT BREAST INVASIVE DUCTAL CA  STAGE IIB---  S/P BILATERAL MASTECTOMIES AND RIGHT NODE DISSECTION   ONCOLOGIST-- DR Jana Hakim--  CHEMO ENDED [place] 2012 / RADIATION ENDED MAR 2013--  CURRENT ON TAMOXIFEN AND ZOLADEX  . History of idiopathic seizure    DX 2008 --  LAST ONE 2012--  NO ISSUES SINCE AND NO MEDS. -- PT STATES DOCTORS FELT IT WAS STRESS RELATED DUE TO HEALTH ISSUES  . Hot flashes due to tamoxifen   . Hx MRSA infection 03/28/11   Skin  . Left ovarian cyst   . Migraines   . Neuropathy   . Nocturia   . Right lower quadrant abdominal pain   . Seizures ([place])    2016 last seizure   . Status post chemotherapy    docetaxel/carboplatin/trastuzumab  . Strains to urinate   . Urgency of urination     Past Surgical History:  Procedure Laterality Date  . BIOPSY BREAST  10/14/2010   right needle core biopsy  . BREAST SURGERY  11/16/2010    bilateral mastectomy+ right axillary node dissection,T1cN1a, Her2+,ERPR+  (right breast invasive ductal carcinoma)  . CYSTO WITH HYDRODISTENSION N/A 07/23/2012   Procedure: CYSTOSCOPY/HYDRODISTENSION instillation of marcaine and pyridium;  Surgeon: Reece Packer, MD;  Location: [facility];  Service: Urology;  Laterality: N/A;  INSTILLATION    . HARDWARE REMOVAL Right 12/03/2018   Procedure: Right tibia screw removal;  Surgeon: Gaynelle Arabian, MD;  Location: [facility];  Service: Orthopedics;  Laterality: Right;  3mn  . I & D EXTREMITY  09/26/2011   Procedure: IRRIGATION AND DEBRIDEMENT EXTREMITY;  Surgeon: KTennis Must MD;  Location: [facility];  Service: Orthopedics;  Laterality: Right;  I&D right hand cat bite  wound  . ORIF TIBIA PLATEAU Right 11/27/2017   Procedure: OPEN REDUCTION INTERNAL FIXATION (ORIF) TIBIAL PLATEAU FRACTURE;  Surgeon: Gaynelle Arabian, MD;  Location: [facility];  Service: Orthopedics;  Laterality: Right;  . PORTACATH PLACEMENT  11/16/2010   placement of left subclavian port NOW REMOVED  . right knee torn meniscus surgery     . TOTAL HIP ARTHROPLASTY Left 06/13/2018   Procedure: TOTAL HIP ARTHROPLASTY ANTERIOR APPROACH;  Surgeon: Gaynelle Arabian, MD;  Location[facility];  Service: Orthopedics;  Laterality: Left;  19mn  . TRANSTHORACIC ECHOCARDIOGRAM  05-28-2012   LVF NORMAL/  EF 55-60%  . WISDOM TOOTH EXTRACTION     Family History:  Family History  Problem Relation Age of Onset  . Cancer Maternal Grandfather   . Mental illness Brother   . Mental illness Son    Family Psychiatric  History: See admission H&P Social History:  Social History   Substance and Sexual Activity  Alcohol Use Yes   Comment: occ. binge drinker monthly or less     Social History   Substance and Sexual Activity  Drug Use No   Comment: LAST USED [place]OCAINE AND [place]    Social History   Socioeconomic History  . Marital status: Married    Spouse name: CGlendell Docker . Number of  children: 1  . Years of education: HS  . Highest education level: Not on file  Occupational History    Employer: NOT EMPLOYED  Tobacco Use  . Smoking status: Current Every Day Smoker    Packs/day: 1.00    Years: 20.00    Pack years: 20.00    Types: Cigarettes  . Smokeless tobacco: Never Used  Substance and Sexual Activity  . Alcohol use: Yes    Comment: occ. binge drinker monthly or less  . Drug use: No    Comment: LAST USED COCAINE AND [place]  . Sexual activity: Yes    Partners: Male    Birth control/protection: Injection    Comment: "zylodex" to "shut down ovaries"  Other Topics Concern  . Not on file  Social History Narrative   Married 4 years   1 son age 42  Unemployed      Grandfather died unknown malignacy   No family h/o breast, ovarian or colon ca      Menarche age 44     Patient lives at home with spouse.   Caffeine Use: Up to 1 pot of coffee daily                  Social Determinants of Health   Financial Resource Strain:   . Difficulty of Paying Living Expenses:   Food Insecurity:   . Worried About RCharity fundraiserin the Last Year:   . RArboriculturistin the Last Year:   Transportation Needs:   . LFilm/video editor(Medical):   .Marland KitchenLack of Transportation (Non-Medical):   Physical Activity:   . Days of Exercise per Week:   . Minutes of Exercise per Session:   Stress:   . Feeling of Stress :   Social Connections:   . Frequency of Communication with Friends and Family:   . Frequency of Social Gatherings with Friends and Family:   . Attends Religious Services:   . Active Member of Clubs or Organizations:   . Attends CArchivistMeetings:   .Marland KitchenMarital Status:    Additional Social History:   Sleep: Poor  Appetite:  Fair  Current Medications: Current Facility-Administered Medications  Medication Dose Route Frequency Provider Last Rate Last Admin  . acetaminophen (TYLENOL) tablet 650 mg  650 mg Oral Q6H PRN Sharma Covert, MD   650 mg at 08/22/19 [phone]  . ALPRAZolam Duanne Moron) tablet 2 mg  2 mg Oral BID Sharma Covert, MD   2 mg at 08/23/19 0729  . alum & mag hydroxide-simeth (MAALOX/MYLANTA) 200-200-20 MG/5ML suspension 30 mL  30 mL Oral Q4H PRN Sharma Covert, MD      . bisacodyl (DULCOLAX) EC tablet 10 mg  10 mg Oral Once Lindell Spar I, NP      . bisacodyl (DULCOLAX) suppository 10 mg  10 mg Rectal Once Lindell Spar I, NP      . carbamazepine (TEGRETOL) chewable tablet 100 mg  100 mg Oral TID Sharma Covert, MD   100 mg at 08/23/19 1144  . folic acid (FOLVITE) tablet 1 mg  1 mg Oral Daily Sharma Covert, MD   1 mg at 08/23/19 0731  . gabapentin (NEURONTIN) tablet 1,200 mg  1,200 mg Oral QHS Sharma Covert, MD   1,200 mg at 08/22/19 2159  . gabapentin (NEURONTIN) tablet 1,800 mg  1,800 mg Oral Daily Sharma Covert, MD   1,800 mg at 08/23/19 6659  . hydrOXYzine (ATARAX/VISTARIL) tablet 25 mg  25 mg Oral TID PRN Sharma Covert, MD   25 mg at 08/22/19 9357  . hydrOXYzine (ATARAX/VISTARIL) tablet 50 mg  50 mg Oral QHS Manuel Lawhead I, NP      . magnesium hydroxide (MILK OF MAGNESIA) suspension 30 mL  30 mL Oral Daily PRN Sharma Covert, MD   30 mL at 08/23/19 0737  . mirtazapine (REMERON) tablet 30 mg  30 mg Oral QHS Sharma Covert, MD   30 mg at 08/22/19 2159  . nicotine (NICODERM CQ - dosed in mg/24 hours) patch 21 mg  21 mg Transdermal Daily Sharma Covert, MD   21 mg at 08/23/19 0732  . pantoprazole (PROTONIX) EC tablet 40 mg  40 mg Oral Daily Sharma Covert, MD   40 mg at 08/23/19 0731  . polyethylene glycol (MIRALAX / GLYCOLAX) packet 17 g  17 g Oral Daily Sharma Covert, MD   17 g at 08/23/19 0929  . senna (SENOKOT) tablet 8.6 mg  1 tablet Oral QHS PRN Sharma Covert, MD      . thiamine tablet 100 mg  100 mg Oral Daily Sharma Covert, MD   100 mg at 08/23/19 0731  . traZODone (DESYREL) tablet 100 mg  100 mg Oral QHS Zanyiah Posten I, NP        Facility-Administered Medications Ordered in Other Encounters  Medication Dose Route Frequency Provider Last Rate Last Admin  . goserelin (ZOLADEX) injection 10.8 mg  10.8 mg Subcutaneous Q90 days [name], Virgie Dad, MD   10.8 mg at 02/02/16 1501   Lab Results:  Results for orders placed or performed during the hospital encounter of 08/21/19 (from the past 48 hour(s))  Comprehensive metabolic panel     Status: Abnormal   Collection Time: 08/21/19  6:39 PM  Result Value Ref Range   Sodium 142 135 - 145 mmol/L   Potassium 3.8 3.5 - 5.1 mmol/L   Chloride 108 98 - 111 mmol/L   CO2 25 22 - 32 mmol/L   Glucose, Bld 101 (H) 70 - 99 mg/dL    Comment: Glucose reference range applies  only to samples taken after fasting for at least 8 hours.   BUN 20 6 - 20 mg/dL   Creatinine, Ser 1.01 (H) 0.44 - 1.00 mg/dL   Calcium 9.3 8.9 - 10.3 mg/dL   Total Protein 6.9 6.5 - 8.1 g/dL   Albumin 4.3 3.5 - 5.0 g/dL   AST 24 15 - 41 U/L   ALT 22 0 - 44 U/L   Alkaline Phosphatase 62 38 - 126 U/L   Total Bilirubin 0.4 0.3 - 1.2 mg/dL   GFR calc non Af Amer >60 >60 mL/min   GFR calc Af Amer >60 >60 mL/min   Anion gap 9 5 - 15    Comment: Performed at [facility], [place] [address], [place], [place] [address]  Ethanol     Status: None   Collection Time: 08/21/19  6:39 PM  Result Value Ref Range   Alcohol, Ethyl (B) <10 <10 mg/dL    Comment: (NOTE) Lowest detectable limit for serum alcohol is 10 mg/dL. For medical purposes only. Performed at [name], [place] [address], [place], [place] [address]   Hemoglobin A1c     Status: None   Collection Time: 08/21/19  6:39 PM  Result Value Ref Range   Hgb A1c MFr Bld 4.8 4.8 - 5.6 %    Comment: (NOTE) Pre diabetes:          5.7%-6.4% Diabetes:              >6.4% Glycemic control for   <7.0% adults with diabetes    Mean Plasma Glucose 91.06 mg/dL    Comment: Performed at [facility] [address]  [place], [place] [address]  Hepatic function panel     Status: None   Collection Time: 08/21/19  6:39 PM  Result Value Ref Range   Total Protein 7.0 6.5 - 8.1 g/dL   Albumin 4.2 3.5 - 5.0 g/dL   AST 24 15 - 41 U/L   ALT 22 0 - 44 U/L   Alkaline Phosphatase 62 38 - 126 U/L   Total Bilirubin 0.4 0.3 - 1.2 mg/dL   Bilirubin, Direct <0.1 0.0 - 0.2 mg/dL   Indirect Bilirubin NOT CALCULATED 0.3 - 0.9 mg/dL    Comment: Performed at [facility], [place] [address], [place],  [address]  Lipid panel     Status: Abnormal   Collection Time: 08/21/19  6:39 PM  Result Value Ref Range   Cholesterol 212 (H) 0 - 200 mg/dL   Triglycerides 110 <150 mg/dL   HDL 74 >40 mg/dL   Total CHOL/HDL Ratio 2.9 RATIO   VLDL 22 0 - 40 mg/dL   LDL Cholesterol 116 (H) 0 - 99 mg/dL    Comment:        Total Cholesterol/HDL:CHD Risk Coronary Heart Disease Risk Table                     Men   Women  1/2 Average Risk   3.4   3.3  Average Risk       5.0   4.4  2 X Average Risk   9.6   7.1  3 X Average Risk  23.4   11.0        Use the calculated Patient Ratio above and the CHD Risk Table to determine the patient's CHD Risk.        ATP III CLASSIFICATION (LDL):  <100     mg/dL  Optimal  100-129  mg/dL   Near or Above                    Optimal  130-159  mg/dL   Borderline  160-189  mg/dL   High  >190     mg/dL   Very High Performed at [facility] [address] [place], [place] [address]   Magnesium     Status: None   Collection Time: 08/21/19  6:39 PM  Result Value Ref Range   Magnesium 2.2 1.7 - 2.4 mg/dL    Comment: Performed at [facility], [place] [address] [place], [place] [address]  TSH     Status: None   Collection Time: 08/21/19  6:39 PM  Result Value Ref Range   TSH 2.167 0.350 - 4.500 uIU/mL    Comment: Performed by a 3rd Generation assay with a functional sensitivity of <=0.01 uIU/mL. Performed at [facility] [place] [facility], [place] [address], [place], [place] [address]    Blood Alcohol level:  Lab Results  Component Value Date   ETH <10 08/21/2019   ETH <10 81/85/9093   Metabolic Disorder Labs: Lab Results  Component Value Date   HGBA1C 4.8 08/21/2019   MPG 91.06 08/21/2019   No results found for: PROLACTIN Lab Results  Component Value Date   CHOL 212 (H) 08/21/2019   TRIG 110 08/21/2019   HDL 74 08/21/2019   CHOLHDL 2.9 08/21/2019   VLDL 22 08/21/2019   LDLCALC 116 (H) 08/21/2019   Physical Findings: AIMS: Facial and Oral Movements Muscles of Facial Expression: None, normal Lips and Perioral Area: None, normal Jaw: None, normal Tongue: None, normal,Extremity Movements Upper (arms, wrists, hands, fingers): None, normal Lower (legs, knees, ankles, toes): None, normal, Trunk Movements Neck, shoulders, hips: None, normal, Overall Severity Severity of abnormal movements (highest score from questions above): None, normal Incapacitation due to abnormal movements: None, normal Patient's awareness of abnormal movements (rate only patient's report): No Awareness, Dental Status Current problems with teeth and/or dentures?: No Does patient usually wear dentures?: No  CIWA:  CIWA-Ar Total: 1 COWS:     Musculoskeletal: Strength & Muscle Tone: within normal limits Gait & Station: normal Patient leans: N/A  Psychiatric Specialty Exam: Physical Exam  Nursing note and vitals reviewed. Constitutional: She is oriented to person, place, and time. She appears well-developed and well-nourished.  HENT:  Head: Normocephalic and atraumatic.  Respiratory: Effort normal.  Musculoskeletal:     Cervical back: Normal range of motion.  Neurological: She is alert and oriented to person, place, and time.    Review of Systems  Constitutional: Negative for chills and diaphoresis.  HENT: Negative for congestion, rhinorrhea, sneezing and sore throat.   Respiratory: Negative for cough, chest tightness, shortness of  breath and wheezing.   Cardiovascular: Negative for chest pain and palpitations.  Gastrointestinal: Positive for constipation. Negative for diarrhea, nausea and vomiting.  Genitourinary: Negative for difficulty urinating.  Musculoskeletal: Negative.   Skin: Negative.        S/P self inflicted lacerations to neck areas.  Allergic/Immunologic: Negative for environmental allergies and food allergies.       Allergies: Tramadol, phenytoin, Thorazine, Cipro, Vnacomycin  Neurological: Negative for dizziness, tremors, seizures, numbness and headaches.  Psychiatric/Behavioral: Positive for dysphoric mood ("Improving some"), self-injury (Hx. self-injurious behavior) and sleep disturbance. Negative for agitation, behavioral problems, confusion, decreased concentration, hallucinations and suicidal ideas. The patient is nervous/anxious. The patient is not hyperactive.  Blood pressure (!) 125/108, pulse (!) 105, temperature 98 F (36.7 C), temperature source Oral, resp. rate 14, height '5\' 4"'  (1.626 m), weight 55 kg, SpO2 100 %.Body mass index is 20.81 kg/m.  General Appearance: Casual and Fairly Groomed  Eye Contact:  Good  Speech:  Clear and Coherent and Normal Rate  Volume:  Normal  Mood:  Anxious, Dysphoric and Irritable  Affect:  Blunt  Thought Process:  Coherent and Descriptions of Associations: Circumstantial  Orientation:  Full (Time, Place, and Person)  Thought Content:  Rumination  Suicidal Thoughts:  Yes.  without intent/plan  Homicidal Thoughts:  No  Memory:  Immediate;   Fair Recent;   Fair Remote;   Fair  Judgement:  Impaired  Insight:  Lacking  Psychomotor Activity:  Increased  Concentration:  Concentration: Fair and Attention Span: Fair  Recall:  [employer] of Knowledge:  Fair  Language:  Fair  Akathisia:  Negative  Handed:  Right  AIMS (if indicated):     Assets:  Desire for Improvement Housing Resilience  ADL's:  Intact  Cognition:  WNL  Sleep:  Number of Hours: 3.75    Treatment Plan Summary: Daily contact with patient to assess and evaluate symptoms and progress in treatment, Medication management and Plan : Patient is seen and examined.  Patient is a 44 year old female with the above-stated past psychiatric history who is seen in follow-up.   - Continue inpatient hospitalization.  - Will continue today 08/23/2019 plan as below except where it is noted.  Diagnosis:  #1 unspecified bipolar disorder,  #2 posttraumatic stress disorder,  #3 generalized anxiety disorder, #4 anorexia,  #5 insomnia,  #6 probable binge drinking disorder,  #7 unspecified personality disorder,  #8 history of breast cancer  Patient is seen for follow-up care assessment.  She is continues to complain of feeling anxious, what seem like medication seeking behaviors continues. She is wondering why she is not on Adderrall. She remains on Tegretol 100 mg p.o. 3 times daily for mood stability to decrease her lability.  If that successful I will try and reduce her gabapentin dosage. It has been discussed with her attending psychiatrist about decreasing the Xanax and she is completely against that. She was asked if she is taking more Xanax at home than the 1 mg 4 times a day or 2 mg twice a day and she denied that. Her mirtazapine was increased to 30 mg Q hs. She seem to be doing okay today. No signs of mania or rapid speech noted.  No other changes in her current medications.  1.  Continue alprazolam 2 mg p.o. twice daily for anxiety. 2.  Continue Tegretol 100 mg p.o. 3 times daily for mood stability. 3.  Continue folic acid 1 mg p.o. daily for nutritional supplementation. 4.  Continue Neurontin 1200 mg p.o. nightly and 1800 mg p.o. daily for mood stability, chronic pain and sleep. 5.  Continue hydroxyzine 25 mg p.o. 3 times daily as needed anxiety. 6.  Continue mirtazapine 30 mg p.o. nightly for anxiety and depression. 7.  Continue Protonix 40 mg p.o. daily for gastric protection. 8.   Continue thiamine 100 mg p.o. daily for nutritional supplementation. 9.  Changed trazodone 100 mg p.o. Q nightly routinely for insomnia. 10. Disposition planning-in progress. 11. Patient continues to attend & participate in the group sessions. 12. Ordered one time dose of dulcolax 10 supp & 10 mg oral x 1 for complain of constipation.  Lindell Spar, NP, PMHNP, FNP-BC 08/23/2019, 12:36  PMPatient ID: [name], female   DOB: 09/23/1975, 44 y.o.   MRN: [id number]

## 2019-08-23 NOTE — Progress Notes (Addendum)
   08/23/19 0020  Psych Admission Type (Psych Patients Only)  Admission Status Voluntary  Psychosocial Assessment  Patient Complaints Hyperactivity;Anxiety  Eye Contact Fair  Facial Expression Anxious;Sad  Affect Anxious;Depressed;Sad  Speech Logical/coherent  Interaction Assertive  Motor Activity Slow  Appearance/Hygiene Unremarkable  Behavior Characteristics Cooperative;Anxious  Mood Depressed;Anxious  Thought Process  Coherency WDL  Content WDL  Delusions None reported or observed  Perception WDL  Hallucination None reported or observed  Judgment Poor  Confusion None  Danger to Self  Current suicidal ideation? Passive  Danger to Others  Danger to Others None reported or observed   Pt seen at med window. C/O increased mania today and having no release for all this energy she has. Pt can't stand still at med window. Speech a bit slurred. Pt rates anxiety level as a 10/10. States that she has "50 things going through my mind at one time." Pt afraid that provider will take away her Xanax stating, "It's the only thing that keeps me calm and gets me through." Educated pt on the addictive properties and the need for more medicine when the body gets used to the current dose. Pt verbalized understanding. Pt endorses passive SI saying feelings against herself rate 5/10.

## 2019-08-23 NOTE — [facility] Group Notes (Signed)
08/23/2019 8:45am Type of Group and Topic: Psychoeducational Group: Discharge Planning  Participation Level: Active  Description of Group Discharge planning group reviews patient's anticipated discharge plans and assists patients to anticipate and address any barriers to wellness/recovery in the community. Suicide prevention education is reviewed with patients in group. Therapeutic Goals 1. Patients will state their anticipated discharge plan and mental health aftercare 2. Patients will identify potential barriers to wellness in the community setting 3. Patients will engage in problem solving, solution focused discussion of ways to anticipate and address barriers to wellness/recovery   Summary of Patient Progress Plan for Discharge/Comments:  Tameyah was engaged and participated throughout the group session. Oliana reports that she does not have any concerns at this time. She states "I will be here for a while, so I dont have any questions or concerns right now"   Transportation Means: To be determined, patient reports her husband will pick her up from the hospital.    Supports: Spouse    Therapeutic Modalities: Motivational Interviewing     Radonna Ricker, MSW, [facility]  Phone: [phone] 08/23/2019 1:40 PM

## 2019-08-23 NOTE — Progress Notes (Signed)
   08/23/19 1100  Psych Admission Type (Psych Patients Only)  Admission Status Voluntary  Psychosocial Assessment  Patient Complaints Anxiety  Eye Contact Fair  Facial Expression Anxious;Sad  Affect Anxious;Depressed;Sad  Speech Logical/coherent  Interaction Assertive  Motor Activity Slow  Appearance/Hygiene Unremarkable  Behavior Characteristics Anxious  Mood Anxious  Thought Process  Coherency WDL  Content WDL  Delusions None reported or observed  Perception WDL  Hallucination None reported or observed  Judgment Poor  Confusion None  Danger to Self  Current suicidal ideation? Denies  Danger to Others  Danger to Others None reported or observed

## 2019-08-24 NOTE — [facility] Group Notes (Signed)
Adult Psychoeducational Group Note  Date:  08/24/2019 Time:  3:30 PM  Group Topic/Focus:  Identifying Needs:   The focus of this group is to help patients identify their personal needs that have been historically problematic and identify healthy behaviors to address their needs.  Participation Level:  Active  Participation Quality:  Appropriate  Affect:  Appropriate  Cognitive:  Appropriate  Insight: Improving  Engagement in Group:  Engaged  Modes of Intervention:  Education and Support  Additional Comments:  Pt active in the group.   Paulino Rily 08/24/2019, 3:30 PM

## 2019-08-24 NOTE — Progress Notes (Signed)
[facility] MD Progress Note  08/24/2019 12:14 PM Crystal [name]  MRN:  [id number]  Subjective: Crystal [name] reports, "I'm doing really well today. I think I'm ready to be discharged today. I slept so well last night. I feel great".  Objective:Patient is a 44 year old female with a reported past psychiatric history significant for bipolar disorder, posttraumatic stress disorder, generalized anxiety disorder, anxiety, insomnia, probable binge drinking disorder and concern for borderline personality disorder who was admitted on 08/21/2019 secondary to a suicide attempt by cutting her wrist and neck. Crystal [name] is seen, chart reviewed. The chart findings discussed with the treatment team. She presents alert, oriented & aware of situation. She is visible on the unit, attending group sessions. Crystal [name] expressed today that she is doing great & that she is ready to be discharged today. Unlike yesterday when she expressed during follow-up care assessment that her struggles with mental illness & other medical issues has been a challenge for her & her family. She expressed the feelings she has about not being heard or understood by her family. She expressed that she is still mentally not where she needs to be. She was complaining of her mind not being steady or at peace because she believed it was because she was not taking her Adderall here. She was complaining of not sleeping well the night prior as well as having problems with constipation. To help her sleep a lot better, her Trazodone was changed from prn to routine at bed time. Vistaril 50 mg Q hs was added to argument the Trazodone. A dulcolax 10 mg po & 10 suppository has been ordered 1 time only to help eliminate the constipation. Patient was encouraged to continue to take her medications as recommended. However, today is the opposite. She is presenting with a bright & cheerful affect, good eye contact. She denies any new issues or concerns. She says she & her husband are now  on board for her discharge. She currently denies any SIHI, AVH, delusional thoughts or paranoia. She does not appear to be responding to any internal stimuli. She is in agreement to continue current plan of care as already in progress while waiting to hear when she is going to be discharged.  (Per previous notes): Patient is a 44 year old female with the above-stated past psychiatric history who is seen in follow-up.  She is fairly labile and agitated this morning.  Her speech is somewhat slurred and we again discussed her Xanax.  I brought up the idea that we should decrease the dosage given the slurred speech, but she is terrified by the idea of decreasing the dosage of that medicine.  I did ask her if she had been overusing at home.  She denied that.  We discussed other mood stabilizer she had been on in the past.  She is terrified that she will "make me into a zombie".  We discussed whether or not she had been on Tegretol in the past.  She stated that she thought she had taken Tegretol and Thorazine together, but could not remember how the Tegretol did.  She stated that she had spoke to her husband last night and that he stated "you need to stay in the hospital for at least 30 days".  She is terrified by the idea of being in the hospital for extended period of time.  I tried to calm her, and to assure her that most likely it would not be that length of time.  Her blood pressure is mildly elevated at  122/92, and 134/95.  She is not tachycardic.  She is afebrile.  Her CIWA this morning was 4.  She only slept 3.75 hours last night.  Review of her laboratories from 4/21 showed a mildly increased creatinine at 1.01, but all the rest of her electrolytes were essentially normal.  Her lipid panel showed cholesterol elevated at 212, but otherwise essentially normal.  Her TSH came back at 2.167.  Urinalysis from 4/21 showed rare bacteria, 6-10 epithelial cells, but 0-5 white blood cells.  Pregnancy test again as per  admission.  Principal Problem: Severe recurrent major depression without psychotic features ([place]) Diagnosis: Principal Problem:   Severe recurrent major depression without psychotic features ([place]) Active Problems:   Suicide attempt ([place])  Total Time spent with patient: 15 minutes  Past Psychiatric History: See admission H&P  Past Medical History:  Past Medical History:  Diagnosis Date  . Alcohol abuse   . Anorexia nervosa   . Anxiety   . Avascular necrosis of bone of hip ([place])   . Bipolar affective disorder, mixed ([place])   . Cancer ([place])   . Chronic pain   . Depression   . Dilated cardiomyopathy secondary to drug ([place])   . DJD (degenerative joint disease)    "BONE STICKS OUT ON LEFT PART OF LOWER BACK"  . DVT of upper extremity (deep vein thrombosis) ([place]) 2014  . Edema of upper extremity   . Family history of anesthesia complication    MOTHER PONV  . Frequency of urination   . GERD (gastroesophageal reflux disease)    OCCASIONALLY TAKE ZANTAC  . History of breast cancer DX JUNE 2012 W/ RIGHT BREAST INVASIVE DUCTAL CA  STAGE IIB---  S/P BILATERAL MASTECTOMIES AND RIGHT NODE DISSECTION   ONCOLOGIST-- DR Jana Hakim--  CHEMO ENDED [place] 2012 / RADIATION ENDED MAR 2013--  CURRENT ON TAMOXIFEN AND ZOLADEX  . History of idiopathic seizure    DX 2008 --  LAST ONE 2012--  NO ISSUES SINCE AND NO MEDS. -- PT STATES DOCTORS FELT IT WAS STRESS RELATED DUE TO HEALTH ISSUES  . Hot flashes due to tamoxifen   . Hx MRSA infection 03/28/11   Skin  . Left ovarian cyst   . Migraines   . Neuropathy   . Nocturia   . Right lower quadrant abdominal pain   . Seizures ([place])    2016 last seizure   . Status post chemotherapy    docetaxel/carboplatin/trastuzumab  . Strains to urinate   . Urgency of urination     Past Surgical History:  Procedure Laterality Date  . BIOPSY BREAST  10/14/2010   right needle core biopsy  . BREAST SURGERY  11/16/2010   bilateral mastectomy+ right axillary node  dissection,T1cN1a, Her2+,ERPR+  (right breast invasive ductal carcinoma)  . CYSTO WITH HYDRODISTENSION N/A 07/23/2012   Procedure: CYSTOSCOPY/HYDRODISTENSION instillation of marcaine and pyridium;  Surgeon: Reece Packer, MD;  [facility];  Service: Urology;  Laterality: N/A;  INSTILLATION    . HARDWARE REMOVAL Right 12/03/2018   Procedure: Right tibia screw removal;  Surgeon: Gaynelle Arabian, MD;  Location: [facility];  Service: Orthopedics;  Laterality: Right;  18mn  . I & D EXTREMITY  09/26/2011   Procedure: IRRIGATION AND DEBRIDEMENT EXTREMITY;  Surgeon: KTennis Must MD;  Location: [facility];  Service: Orthopedics;  Laterality: Right;  I&D right hand cat bite wound  . ORIF TIBIA PLATEAU Right 11/27/2017   Procedure: OPEN REDUCTION INTERNAL FIXATION (ORIF) TIBIAL PLATEAU FRACTURE;  Surgeon:  Gaynelle Arabian, MD;  Location[facility];  Service: Orthopedics;  Laterality: Right;  . PORTACATH PLACEMENT  11/16/2010   placement of left subclavian port NOW REMOVED  . right knee torn meniscus surgery     . TOTAL HIP ARTHROPLASTY Left 06/13/2018   Procedure: TOTAL HIP ARTHROPLASTY ANTERIOR APPROACH;  Surgeon: Gaynelle Arabian, MD;  [facility];  Service: Orthopedics;  Laterality: Left;  134mn  . TRANSTHORACIC ECHOCARDIOGRAM  05-28-2012   LVF NORMAL/  EF 55-60%  . WISDOM TOOTH EXTRACTION     Family History:  Family History  Problem Relation Age of Onset  . Cancer Maternal Grandfather   . Mental illness Brother   . Mental illness Son    Family Psychiatric  History: See admission H&P Social History:  Social History   Substance and Sexual Activity  Alcohol Use Yes   Comment: occ. binge drinker monthly or less     Social History   Substance and Sexual Activity  Drug Use No   Comment: [place]    Social History   Socioeconomic History  . Marital status: Married    Spouse name: CGlendell Docker . Number of children: 1  . Years of education: HS  .  Highest education level: Not on file  Occupational History    Employer: NOT EMPLOYED  Tobacco Use  . Smoking status: Current Every Day Smoker    Packs/day: 1.00    Years: 20.00    Pack years: 20.00    Types: Cigarettes  . Smokeless tobacco: Never Used  Substance and Sexual Activity  . Alcohol use: Yes    Comment: occ. binge drinker monthly or less  . Drug use: No    Comment: [place]  . Sexual activity: Yes    Partners: Male    Birth control/protection: Injection    Comment: "zylodex" to "shut down ovaries"  Other Topics Concern  . Not on file  Social History Narrative   Married 4 years   1 son age [id number]  Unemployed      Grandfather died unknown malignacy   No family h/o breast, ovarian or colon ca      Menarche age [id number]     Patient lives at home with spouse.   Caffeine Use: Up to 1 pot of coffee daily                  Social Determinants of Health   Financial Resource Strain:   . Difficulty of Paying Living Expenses:   Food Insecurity:   . Worried About RCharity fundraiserin the Last Year:   . RArboriculturistin the Last Year:   Transportation Needs:   . LFilm/video editor(Medical):   .Marland KitchenLack of Transportation (Non-Medical):   Physical Activity:   . Days of Exercise per Week:   . Minutes of Exercise per Session:   Stress:   . Feeling of Stress :   Social Connections:   . Frequency of Communication with Friends and Family:   . Frequency of Social Gatherings with Friends and Family:   . Attends Religious Services:   . Active Member of Clubs or Organizations:   . Attends CArchivistMeetings:   .Marland KitchenMarital Status:    Additional Social History:   Sleep: Good  Appetite:  Good  Current Medications: Current Facility-Administered Medications  Medication Dose Route Frequency Provider Last Rate Last Admin  . acetaminophen (TYLENOL)  tablet 650 mg  650 mg Oral Q6H PRN Sharma Covert, MD   650 mg at 08/22/19 [phone]  .  ALPRAZolam Duanne Moron) tablet 2 mg  2 mg Oral BID Sharma Covert, MD   2 mg at 08/24/19 0835  . alum & mag hydroxide-simeth (MAALOX/MYLANTA) 200-200-20 MG/5ML suspension 30 mL  30 mL Oral Q4H PRN Sharma Covert, MD      . carbamazepine (TEGRETOL) chewable tablet 200 mg  200 mg Oral TID Lindell Spar I, NP   200 mg at 08/24/19 1206  . folic acid (FOLVITE) tablet 1 mg  1 mg Oral Daily Sharma Covert, MD   1 mg at 08/24/19 0835  . gabapentin (NEURONTIN) tablet 1,200 mg  1,200 mg Oral QHS Sharma Covert, MD   1,200 mg at 08/23/19 2103  . gabapentin (NEURONTIN) tablet 1,800 mg  1,800 mg Oral Daily Sharma Covert, MD   1,800 mg at 08/24/19 2979  . hydrOXYzine (ATARAX/VISTARIL) tablet 25 mg  25 mg Oral TID PRN Sharma Covert, MD   25 mg at 08/22/19 0939  . hydrOXYzine (ATARAX/VISTARIL) tablet 50 mg  50 mg Oral QHS Lindell Spar I, NP   50 mg at 08/23/19 2104  . magnesium hydroxide (MILK OF MAGNESIA) suspension 30 mL  30 mL Oral Daily PRN Sharma Covert, MD   30 mL at 08/23/19 0737  . mirtazapine (REMERON) tablet 30 mg  30 mg Oral QHS Sharma Covert, MD   30 mg at 08/23/19 2104  . nicotine (NICODERM CQ - dosed in mg/24 hours) patch 21 mg  21 mg Transdermal Daily Sharma Covert, MD   21 mg at 08/24/19 0836  . pantoprazole (PROTONIX) EC tablet 40 mg  40 mg Oral Daily Sharma Covert, MD   40 mg at 08/24/19 0835  . polyethylene glycol (MIRALAX / GLYCOLAX) packet 17 g  17 g Oral Daily Sharma Covert, MD   17 g at 08/24/19 0835  . senna (SENOKOT) tablet 8.6 mg  1 tablet Oral QHS PRN Sharma Covert, MD      . thiamine tablet 100 mg  100 mg Oral Daily Sharma Covert, MD   100 mg at 08/24/19 0835  . traZODone (DESYREL) tablet 100 mg  100 mg Oral QHS Yulanda Diggs I, NP   100 mg at 08/23/19 2103   Facility-Administered Medications Ordered in Other Encounters  Medication Dose Route Frequency Provider Last Rate Last Admin  . goserelin (ZOLADEX) injection 10.8 mg  10.8 mg  Subcutaneous Q90 days [name], Virgie Dad, MD   10.8 mg at 02/02/16 1501   Lab Results:  No results found for this or any previous visit (from the past 48 hour(s)). Blood Alcohol level:  Lab Results  Component Value Date   ETH <10 08/21/2019   ETH <10 89/21/1941   Metabolic Disorder Labs: Lab Results  Component Value Date   HGBA1C 4.8 08/21/2019   MPG 91.06 08/21/2019   No results found for: PROLACTIN Lab Results  Component Value Date   CHOL 212 (H) 08/21/2019   TRIG 110 08/21/2019   HDL 74 08/21/2019   CHOLHDL 2.9 08/21/2019   VLDL 22 08/21/2019   LDLCALC 116 (H) 08/21/2019   Physical Findings: AIMS: Facial and Oral Movements Muscles of Facial Expression: None, normal Lips and Perioral Area: None, normal Jaw: None, normal Tongue: None, normal,Extremity Movements Upper (arms, wrists, hands, fingers): None, normal Lower (legs, knees, ankles, toes): None, normal, Trunk Movements Neck, shoulders,  hips: None, normal, Overall Severity Severity of abnormal movements (highest score from questions above): None, normal Incapacitation due to abnormal movements: None, normal Patient's awareness of abnormal movements (rate only patient's report): No Awareness, Dental Status Current problems with teeth and/or dentures?: No Does patient usually wear dentures?: No  CIWA:  CIWA-Ar Total: 1 COWS:     Musculoskeletal: Strength & Muscle Tone: within normal limits Gait & Station: normal Patient leans: N/A  Psychiatric Specialty Exam: Physical Exam  Nursing note and vitals reviewed. Constitutional: She is oriented to person, place, and time. She appears well-developed and well-nourished.  HENT:  Head: Normocephalic and atraumatic.  Respiratory: Effort normal.  Musculoskeletal:     Cervical back: Normal range of motion.  Neurological: She is alert and oriented to person, place, and time.    Review of Systems  Constitutional: Negative for chills and diaphoresis.  HENT: Negative  for congestion, rhinorrhea, sneezing and sore throat.   Respiratory: Negative for cough, chest tightness, shortness of breath and wheezing.   Cardiovascular: Negative for chest pain and palpitations.  Gastrointestinal: Positive for constipation. Negative for diarrhea, nausea and vomiting.  Genitourinary: Negative for difficulty urinating.  Musculoskeletal: Negative.   Skin: Negative.        S/P self inflicted lacerations to neck areas.  Allergic/Immunologic: Negative for environmental allergies and food allergies.       Allergies: Tramadol, phenytoin, Thorazine, Cipro, Vnacomycin  Neurological: Negative for dizziness, tremors, seizures, numbness and headaches.  Psychiatric/Behavioral: Positive for dysphoric mood ("Improving some"), self-injury (Hx. self-injurious behavior) and sleep disturbance. Negative for agitation, behavioral problems, confusion, decreased concentration, hallucinations and suicidal ideas. The patient is nervous/anxious. The patient is not hyperactive.     Blood pressure (!) 116/96, pulse (!) 113, temperature 98 F (36.7 C), resp. rate 20, height _0  (1.626 m), weight 55 kg, SpO2 100 %.Body mass index is 20.81 kg/m.  General Appearance: Casual and Fairly Groomed  Eye Contact:  Good  Speech:  Clear and Coherent and Normal Rate  Volume:  Normal  Mood:  Euthymic  Affect:  Blunt  Thought Process:  Coherent and Descriptions of Associations: Circumstantial  Orientation:  Full (Time, Place, and Person)  Thought Content:  Logical  Suicidal Thoughts:  Denies any thoughts, plans or intent  Homicidal Thoughts:  No  Memory:  Immediate;   Fair Recent;   Fair Remote;   Fair  Judgement:  Fair  Insight:  Lacking  Psychomotor Activity:  Increased  Concentration:  Concentration: Fair and Attention Span: Fair  Recall:  [employer] of Knowledge:  Fair  Language:  Fair  Akathisia:  Negative  Handed:  Right  AIMS (if indicated):     Assets:  Desire for  Improvement Housing Resilience  ADL's:  Intact  Cognition:  WNL  Sleep:  Number of Hours: 3.75   Treatment Plan Summary: Daily contact with patient to assess and evaluate symptoms and progress in treatment, Medication management and Plan : Patient is seen and examined.  Patient is a 44 year old female with the above-stated past psychiatric history who is seen in follow-up.   - Continue inpatient hospitalization.  - Will continue today 08/24/2019 plan as below except where it is noted.  Diagnosis:  #1 unspecified bipolar disorder,  #2 posttraumatic stress disorder,  #3 generalized anxiety disorder, #4 anorexia,  #5 insomnia,  #6 probable binge drinking disorder,  #7 unspecified personality disorder,  #8 history of breast cancer  Patient is seen for follow-up care assessment.  She is says she  feeling a lot better today. She remains on Tegretol 100 mg p.o. 3 times daily for mood stability to decrease her lability.  If that successful I will try and reduce her gabapentin dosage. It has been discussed with her attending psychiatrist about decreasing the Xanax and she is completely against that. She was asked if she is taking more Xanax at home than the 1 mg 4 times a day or 2 mg twice a day and she denied that. Her mirtazapine was increased to 30 mg Q hs. She seem to be doing okay today. No signs of mania or rapid speech noted.  No other changes in her current medications.  1.  Continue alprazolam 2 mg p.o. twice daily for anxiety. 2.  Continue Tegretol 100 mg p.o. 3 times daily for mood stability. 3.  Continue folic acid 1 mg p.o. daily for nutritional supplementation. 4.  Continue Neurontin 1200 mg p.o. nightly and 1800 mg p.o. daily for mood stability, chronic pain and sleep. 5.  Continue hydroxyzine 25 mg p.o. 3 times daily as needed anxiety. 6.  Continue mirtazapine 30 mg p.o. nightly for anxiety and depression. 7.  Continue Protonix 40 mg p.o. daily for gastric protection. 8.   Continue thiamine 100 mg p.o. daily for nutritional supplementation. 9.  Changed trazodone 100 mg p.o. Q nightly routinely for insomnia. 10. Disposition planning-in progress. 11. Patient continues to attend & participate in the group sessions. 12. Ordered one time dose of dulcolax 10 supp & 10 mg oral x 1 for complain of constipation.  Lindell Spar, NP, PMHNP, FNP-BC 08/24/2019, 12:14 PMPatient ID: Crystal [name], female   DOB: May 05, 1975, 44 y.o.   MRN: [id number] Patient ID: Crystal [name], female   DOB: 05/12/1975, 44 y.o.   MRN: [id number]

## 2019-08-24 NOTE — Progress Notes (Signed)
[place] Group Notes:  (Nursing/MHT/Case Management/Adjunct)  Date:  08/24/2019  Time:  2030  Type of Therapy:  wrap up group  Participation Level:  Active  Participation Quality:  Appropriate, Attentive, Sharing and Supportive  Affect:  Anxious  Cognitive:  Alert  Insight:  Improving  Engagement in Group:  Engaged  Modes of Intervention:  Clarification, Education and Support   Summary of Progress/Problems: Positive thinking and positive change were discussed.   Shellia Cleverly 08/24/2019, 9:21 PM

## 2019-08-24 NOTE — [facility] Group Notes (Signed)
LCSW Group Therapy Note  08/24/2019   10:00-11:00am   Type of Therapy and Topic:  Group Therapy: Anger Cues and Responses  Participation Level:  Active   Description of Group:   In this group, patients learned how to recognize the physical, cognitive, emotional, and behavioral responses they have to anger-provoking situations.  They identified a recent time they became angry and how they reacted.  They analyzed how their reaction was possibly beneficial and how it was possibly unhelpful.  The group discussed a variety of healthier coping skills that could help with such a situation in the future.  Focus was placed on how helpful it is to recognize the underlying emotions to our anger, because working on those can lead to a more permanent solution as well as our ability to focus on the important rather than the urgent.  Therapeutic Goals: 1. Patients will remember their last incident of anger and how they felt emotionally and physically, what their thoughts were at the time, and how they behaved. 2. Patients will identify how their behavior at that time worked for them, as well as how it worked against them. 3. Patients will explore possible new behaviors to use in future anger situations. 4. Patients will learn that anger itself is normal and cannot be eliminated, and that healthier reactions can assist with resolving conflict rather than worsening situations.  Summary of Patient Progress:  The patient shared that her most recent time of anger was 5 days ago and said her anger was a result of feeling like a burden to her family due to her mental pain, so she tried to commit suicide.  She stated she is now learning coping skills that she never knew about before, so she will have something to do if she ever feels that way again, rather than self-harming.  She stated her patience is now running out, however, because she feels she is ready to discharge.  She was often monopolizing during group, but was  completing on topic and encouraging of other patients.  Therapeutic Modalities:   Cognitive Behavioral Therapy  Maretta Los

## 2019-08-24 NOTE — [facility] Group Notes (Signed)
Adult Psychoeducational Group Note  Date:  08/24/2019 Time:  3:28 PM  Group Topic/Focus:  Goals Group:   The focus of this group is to help patients establish daily goals to achieve during treatment and discuss how the patient can incorporate goal setting into their daily lives to aide in recovery.  Participation Level:  Active  Participation Quality:  Appropriate  Affect:  Appropriate  Cognitive:  Appropriate  Insight: Appropriate  Engagement in Group:  Engaged  Modes of Intervention:  Education and Support  Additional Comments:  Pt invested in the group and was very active.  Crystal [name] 08/24/2019, 3:28 PM

## 2019-08-24 NOTE — Progress Notes (Signed)
   08/24/19 1200  Psych Admission Type (Psych Patients Only)  Admission Status Voluntary  Psychosocial Assessment  Patient Complaints Anxiety  Eye Contact Fair  Facial Expression Animated  Affect Anxious;Inconsistent with thought content;Preoccupied  Speech Logical/coherent  Interaction Assertive  Motor Activity Fidgety  Appearance/Hygiene Unremarkable  Behavior Characteristics Appropriate to situation;Anxious  Mood Anxious;Depressed  Thought Process  Coherency WDL  Content WDL  Delusions None reported or observed  Perception WDL  Hallucination None reported or observed  Judgment WDL  Confusion None  Danger to Self  Current suicidal ideation? Denies  Self-Injurious Behavior No self-injurious ideation or behavior indicators observed or expressed   Danger to Others  Danger to Others None reported or observed
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
## 2019-08-25 LAB — CBC WITH DIFFERENTIAL/PLATELET
Abs Immature Granulocytes: 0.01 10*3/uL (ref 0.00–0.07)
Basophils Absolute: 0.1 10*3/uL (ref 0.0–0.1)
Basophils Relative: 1 %
Eosinophils Absolute: 0.1 10*3/uL (ref 0.0–0.5)
Eosinophils Relative: 2 %
HCT: 45.3 % (ref 36.0–46.0)
Hemoglobin: 15.9 g/dL — ABNORMAL HIGH (ref 12.0–15.0)
Immature Granulocytes: 0 %
Lymphocytes Relative: 36 %
Lymphs Abs: 2 10*3/uL (ref 0.7–4.0)
MCH: 32.9 pg (ref 26.0–34.0)
MCHC: 35.1 g/dL (ref 30.0–36.0)
MCV: 93.8 fL (ref 80.0–100.0)
Monocytes Absolute: 0.5 10*3/uL (ref 0.1–1.0)
Monocytes Relative: 9 %
Neutro Abs: 2.8 10*3/uL (ref 1.7–7.7)
Neutrophils Relative %: 52 %
Platelets: 179 10*3/uL (ref 150–400)
RBC: 4.83 MIL/uL (ref 3.87–5.11)
RDW: 11.8 % (ref 11.5–15.5)
WBC: 5.4 10*3/uL (ref 4.0–10.5)
nRBC: 0 % (ref 0.0–0.2)

## 2019-08-25 LAB — CARBAMAZEPINE LEVEL, TOTAL: Carbamazepine Lvl: 7 ug/mL (ref 4.0–12.0)

## 2019-08-25 LAB — HEPATIC FUNCTION PANEL
ALT: 24 U/L (ref 0–44)
AST: 24 U/L (ref 15–41)
Albumin: 4.5 g/dL (ref 3.5–5.0)
Alkaline Phosphatase: 56 U/L (ref 38–126)
Bilirubin, Direct: 0.1 mg/dL (ref 0.0–0.2)
Indirect Bilirubin: 0.5 mg/dL (ref 0.3–0.9)
Total Bilirubin: 0.6 mg/dL (ref 0.3–1.2)
Total Protein: 7 g/dL (ref 6.5–8.1)

## 2019-08-25 MED ORDER — TRAZODONE HCL 100 MG PO TABS: 100.0000 mg | ORAL_TABLET | Freq: Every day | ORAL | 0 refills | Status: DC

## 2019-08-25 MED ORDER — CARBAMAZEPINE 100 MG PO CHEW: 200.0000 mg | CHEWABLE_TABLET | Freq: Three times a day (TID) | ORAL | 1 refills | Status: DC

## 2019-08-25 MED ORDER — HYDROXYZINE HCL 25 MG PO TABS: 25.0000 mg | ORAL_TABLET | Freq: Three times a day (TID) | ORAL | 0 refills | Status: DC | PRN

## 2019-08-25 MED ORDER — ALPRAZOLAM 2 MG PO TABS: 2.0000 mg | ORAL_TABLET | Freq: Two times a day (BID) | ORAL | 0 refills | Status: DC

## 2019-08-25 MED ORDER — NICOTINE 21 MG/24HR TD PT24: 21.0000 mg | MEDICATED_PATCH | Freq: Every day | TRANSDERMAL | 0 refills | Status: DC

## 2019-08-25 MED ORDER — GABAPENTIN 600 MG PO TABS: 1800.0000 mg | ORAL_TABLET | Freq: Every day | ORAL | 1 refills | Status: DC

## 2019-08-25 MED ORDER — MIRTAZAPINE 30 MG PO TABS: 30.0000 mg | ORAL_TABLET | Freq: Every day | ORAL | 0 refills | Status: DC

## 2019-08-25 MED ORDER — POLYETHYLENE GLYCOL 3350 17 G PO PACK: 17.0000 g | PACK | Freq: Every day | ORAL | 0 refills | Status: DC

## 2019-08-25 MED ORDER — SENNA 8.6 MG PO TABS: 1.0000 | ORAL_TABLET | Freq: Every evening | ORAL | 0 refills | Status: DC | PRN

## 2019-08-25 MED ORDER — HYDROXYZINE HCL 50 MG PO TABS: 50.0000 mg | ORAL_TABLET | Freq: Every day | ORAL | 0 refills | Status: DC

## 2019-08-25 NOTE — Progress Notes (Signed)
D:  Patient's self inventory sheet, patient has decreased sleep.  Good appetite, normal energy level, poor concentration.  Rated depression and hopeless 7, anxiety 9.  Denied withdrawals.  Denied SI.  Physical pain L hip, medication helpful.  Goal is where I am!  Plans to appreciate life.  "Thanks."  Does have discharge plans. A:  Medications administered per MD orders.  Emotional support and encouragement.   R:  Denied SI and HI, contracts for safety.  Denied A/V hallucinations.  Safety maintained with 15 minute checks.

## 2019-08-25 NOTE — Progress Notes (Signed)
Pt discharged to lobby. Pt was stable and appreciative at that time. All papers and prescriptions were given and valuables returned. Verbal understanding expressed. Denies SI/HI and A/VH. Pt given opportunity to express concerns and ask questions.  

## 2019-08-25 NOTE — Progress Notes (Signed)
Pt reports good day, pt stated that she enjoyed the groups meeting, stated she is better after having had a talk with the husband. Pt is looking for discharge home, will continue to monitor.
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
## 2019-08-25 NOTE — BHH Suicide Risk Assessment (Signed)
[facility] Discharge Suicide Risk Assessment   Principal Problem: Severe recurrent major depression without psychotic features [facility]) Discharge Diagnoses: Principal Problem:   Severe recurrent major depression without psychotic features ([place]) Active Problems:   Suicide attempt ([place])   Total Time spent with patient: 20 minutes  Musculoskeletal: Strength & Muscle Tone: within normal limits Gait & Station: normal Patient leans: N/A  Psychiatric Specialty Exam: Review of Systems  All other systems reviewed and are negative.   Blood pressure (!) 115/98, pulse 98, temperature 97.8 F (36.6 C), temperature source Oral, resp. rate 18, height 5\' 4"  (1.626 m), weight 55 kg, SpO2 99 %.Body mass index is 20.81 kg/m.  General Appearance: Casual  Eye Contact::  Fair  Speech:  Pressured409  Volume:  Normal  Mood:  Anxious  Affect:  Congruent  Thought Process:  Coherent and Descriptions of Associations: Intact  Orientation:  Full (Time, Place, and Person)  Thought Content:  Rumination  Suicidal Thoughts:  No  Homicidal Thoughts:  No  Memory:  Immediate;   Fair Recent;   Fair Remote;   Fair  Judgement:  Intact  Insight:  Fair  Psychomotor Activity:  Increased  Concentration:  Fair  Recall:  [employer] of Knowledge:Fair  Language: Good  Akathisia:  Negative  Handed:  Right  AIMS (if indicated):     Assets:  Desire for Improvement Housing Resilience  Sleep:  Number of Hours: 2.25  Cognition: WNL  ADL's:  Intact   Mental Status Per Nursing Assessment::   On Admission:  Suicidal ideation indicated by patient, Plan includes specific time, place, or method, Intention to act on suicide plan, Self-harm thoughts, Belief that plan would result in death, Suicide plan, Self-harm behaviors  Demographic Factors:  Caucasian, Low socioeconomic status and Unemployed  Loss Factors: Financial problems/change in socioeconomic status  Historical Factors: Impulsivity  Risk Reduction Factors:    Living with another person, especially a relative  Continued Clinical Symptoms:  Severe Anxiety and/or Agitation Bipolar Disorder:   Mixed State More than one psychiatric diagnosis Previous Psychiatric Diagnoses and Treatments  Cognitive Features That Contribute To Risk:  Thought constriction (tunnel vision)    Suicide Risk:  Minimal: No identifiable suicidal ideation.  Patients presenting with no risk factors but with morbid ruminations; may be classified as minimal risk based on the severity of the depressive symptoms  [place]. Go on 09/10/2019.   Specialty: Behavioral Health Why: You have an appointment on 09/10/19 at 5:00 pm with Glo Herring for therapy.  You also have an appointment on 09/24/19 on 9:20 am with Noemi Chapel for medication management.  These appointments will be held in person. Contact information: [address] [place] [address] [phone]           Plan Of Care/Follow-up recommendations:  Activity:  ad lib  Sharma Covert, MD 08/25/2019, 12:14 PM

## 2019-08-25 NOTE — Progress Notes (Signed)
  [facility] Adult Case Management Discharge Plan :  Will you be returning to the same living situation after discharge:  Yes,  with husband At discharge, do you have transportation home?: Yes,  husband Do you have the ability to pay for your medications: Yes,  insurance and income  Release of information consent forms completed and emailed to Medical Records, then turned in to Medical Records by CSW.   Patient to Follow up at: Follow-up [place], [facility]. Go on 09/10/2019.   Specialty: Behavioral Health Why: You have an appointment on 09/10/19 at 5:00 pm with Glo Herring for therapy.  You also have an appointment on 09/24/19 on 9:20 am with Noemi Chapel for medication management.  These appointments will be held in person. Contact information: [address] [place] [address] [phone]           Next level of care provider has access to [place] and Suicide Prevention discussed: Yes,  with husband     Has patient been referred to the Quitline?: Patient refused referral  Patient has been referred for addiction treatment: Yes  Maretta Los, LCSW 08/25/2019, 11:59 AM

## 2019-08-25 NOTE — [facility] Group Notes (Signed)
[facility] LCSW Group Therapy Note  08/25/2019    Type of Therapy and Topic:  Group Therapy:  Adding Supports Including Yourself  Participation Level:  Active   Description of Group:   Patients in this group were introduced to the concept that additional supports including self-support are an essential part of recovery.  Patients listed what supports they believe they need to add to their lives to achieve their goals at discharge, and they listed such things as therapist, family, doctor, support groups, 12-step groups and service animals.   A song entitled "My Own Hero" was played and a group discussion ensued in which patients stated they could relate to the song and it inspired them to realize they have be willing to help themselves in order to succeed, because other people cannot achieve sobriety or stability for them.  "Fight For It" was played, then "I Am Enough" to encourage patients.  They discussed the impact on them and how they must remain convinced that their lives are worth the effort it takes to become sober and/or stable.  Therapeutic Goals: 1)  demonstrate the importance of being a key part of one's own support system 2)  discuss various available supports 3)  encourage patient to use music as part of their self-support and focus on goals 4)  elicit ideas from patients about supports that need to be added   Summary of Patient Progress:  The patient expressed that her son and hospital peers are healthy supports, and NAMI will hopefully be a healthy support for the future.  Her husband can be either healthy or unhealthy.  She talked at length about their shared symptoms and how that is a negative impact at times.   Therapeutic Modalities:   Motivational Interviewing Activity  Crystal [name]  8:43 AM

## 2019-12-12 ENCOUNTER — Encounter: Payer: Self-pay | Admitting: Genetic Counselor

## 2020-03-23 NOTE — Progress Notes (Signed)
[facility]  Telephone:([phone]) [phone] Fax:(336) [phone]     ID: Crystal [name]   DOB: [birth year]  MR#: [id number]  YQM#:[id number]  Patient Care Team: Pcp, No as PCP - General Christella App, Virgie Dad, MD as Consulting Physician (Oncology) Gaynelle Arabian, MD as Consulting Physician (Orthopedic Surgery) SU:  Lorine Bears, MD OTHER: Leigh Aurora, Rup[facility]   CHIEF COMPLAINT:  Hx of Right Breast Cancer (s/p bilateral mastectomies)  CURRENT THERAPY: Observation   INTERVAL HISTORY:  Crystal [name] returns today for follow-up of her right breast cancer. She was last seen here in 07/2018. She continues under observation.   Since her last visit, she was seen in the ED on 10/28/2018 for confusion and anxiety in the context of starting a new antidepressant and increasing caffeine use. Chest x-ray and head CT performed that day were both negative.  She experienced a fall back on 11/20/2017 and was found to have a right tibial fracture. She underwent surgery for this on 11/27/2017. Since her last visit, she underwent right tibia screw removal on 12/03/2018 under Dr. Wynelle Link.   REVIEW OF SYSTEMS: Crystal [name] tells me that she is now doing much better, on Abilify.  She had her mother-in-law living with them for many years and she just could not tolerate it.  She had a suicidal episode and at any rate the issue is that right now is just she and Crystal [name] at home.  Crystal [name] has been doing okay except he may or may not have had a heart attack she is not sure.  Crystal [name] son who is now 22 is working for [employer]-, and becoming increasingly independent.  She unfortunately continues to smoke.  She is not currently going to the gym.  A detailed review of systems today was otherwise stable   COVID 19 VACCINATION STATUS:     BREAST CANCER HISTORY: From the initial intake note:  "The patient is a 44 year old Crystal [name] woman who noted a mass in her right breast and brought it to her primary care physician's attention June of  2012. She was set up for mammography at [facility], where an area of calcifications highly suspicious for carcinoma was biopsied. This was in the upper outer quadrant and it measured 1.7 cm mammographically and 1.8 cm by ultrasonography. The pathology report ([id number]) showed a high-grade invasive ductal carcinoma estrogen receptor positive at 64% progesterone receptor positive at 18%, and HER-2 amplified with a ratio by CISH of 5.24. MIB-1-1 was 65%. A second mass biopsied at the same time was also triple positive.   Given her multiple masses in the right breast, mastectomy was recommended. The patient actually opted for bilateral mastectomies and this was performed together with a right axillary lymph node dissection under Dr. Star Age on 11/16/2010. The final pathology [id number]) showed, on the left, no evidence of cancer. On the right the patient had a high-grade invasive ductal carcinoma measuring 1.9 cm, with negative margins, grade 3, involving 2 of 16 lymph nodes (stage IIB)."  Her subsequent history is as detailed below.   PAST MEDICAL HISTORY: Past Medical History:  Diagnosis Date  . Alcohol abuse   . Anorexia nervosa   . Anxiety   . Avascular necrosis of bone of hip ([place])   . Bipolar affective disorder, mixed ([place])   . Cancer ([place])   . Chronic pain   . Depression   . Dilated cardiomyopathy secondary to drug ([place])   . DJD (degenerative joint disease)    "BONE STICKS OUT ON LEFT PART  OF LOWER BACK"  . DVT of upper extremity (deep vein thrombosis) ([place]) 2014  . Edema of upper extremity   . Family history of anesthesia complication    MOTHER PONV  . Frequency of urination   . GERD (gastroesophageal reflux disease)    OCCASIONALLY TAKE ZANTAC  . History of breast cancer DX JUNE 2012 W/ RIGHT BREAST INVASIVE DUCTAL CA  STAGE IIB---  S/P BILATERAL MASTECTOMIES AND RIGHT NODE DISSECTION   ONCOLOGIST-- DR Jana Hakim--  CHEMO ENDED [place] 2012 / RADIATION ENDED MAR 2013--  CURRENT ON  TAMOXIFEN AND ZOLADEX  . History of idiopathic seizure    DX 2008 --  LAST ONE 2012--  NO ISSUES SINCE AND NO MEDS. -- PT STATES DOCTORS FELT IT WAS STRESS RELATED DUE TO HEALTH ISSUES  . Hot flashes due to tamoxifen   . Hx MRSA infection 03/28/11   Skin  . Left ovarian cyst   . Migraines   . Neuropathy   . Nocturia   . Right lower quadrant abdominal pain   . Seizures ([place])    2016 last seizure   . Status post chemotherapy    docetaxel/carboplatin/trastuzumab  . Strains to urinate   . Urgency of urination    1. History of seizures. The patient has been thoroughly evaluated both here by Dr. Gaynell Face and at [facility] for this. Among many studies, she had an MRI of the brain on August 28th. This showed an incidental solitary small lesion in the left frontoparietal white matter. It was not felt to be suggestive of multiple sclerosis. A noncontrasted CT in August 2010 was negative. The patient has been off all seizure medications now for 2 months. There has been no evidence of seizure recurrence. 2. Complex psychology history (please refer to Dr. Iona Coach note in the E-chart from August 29, 2009) with evidence of anxiety disorder, depression and psychosis. The patient is currently on no medications for this and denies any of these symptoms at present. 3. History of migraines. 4. Remote history of multidrug abuse. 5. Status post wisdom teeth extraction.         History of MRSA skin infection   PAST SURGICAL HISTORY: Past Surgical History:  Procedure Laterality Date  . BIOPSY BREAST  10/14/2010   right needle core biopsy  . BREAST SURGERY  11/16/2010   bilateral mastectomy+ right axillary node dissection,T1cN1a, Her2+,ERPR+  (right breast invasive ductal carcinoma)  . CYSTO WITH HYDRODISTENSION N/A 07/23/2012   Procedure: CYSTOSCOPY/HYDRODISTENSION instillation of marcaine and pyridium;  Surgeon: Reece Packer, MD;  Location: [facility];  Service: Urology;  Laterality:  N/A;  INSTILLATION    . HARDWARE REMOVAL Right 12/03/2018   Procedure: Right tibia screw removal;  Surgeon: Gaynelle Arabian, MD;  Location: [facility];  Service: Orthopedics;  Laterality: Right;  46mn  . I & D EXTREMITY  09/26/2011   Procedure: IRRIGATION AND DEBRIDEMENT EXTREMITY;  Surgeon: KTennis Must MD;  Location: [facility];  Service: Orthopedics;  Laterality: Right;  I&D right hand cat bite wound  . ORIF TIBIA PLATEAU Right 11/27/2017   Procedure: OPEN REDUCTION INTERNAL FIXATION (ORIF) TIBIAL PLATEAU FRACTURE;  Surgeon: AGaynelle Arabian MD;  Location: [facility];  Service: Orthopedics;  Laterality: Right;  . PORTACATH PLACEMENT  11/16/2010   placement of left subclavian port NOW REMOVED  . right knee torn meniscus surgery     . TOTAL HIP ARTHROPLASTY Left 06/13/2018   Procedure: TOTAL HIP ARTHROPLASTY ANTERIOR APPROACH;  Surgeon: AGaynelle Arabian MD;  Location:  [facility];  Service: Orthopedics;  Laterality: Left;  171mn  . TRANSTHORACIC ECHOCARDIOGRAM  05-28-2012   LVF NORMAL/  EF 55-60%  . WISDOM TOOTH EXTRACTION      FAMILY HISTORY: Family History  Problem Relation Age of Onset  . Cancer Maternal Grandfather   . Mental illness Brother   . Mental illness Son   The patient's mother is living, in her mid 544s with no history of breast or ovarian cancer. The patient does not know her biological father. The patient has an older brother with bipolar disease, and according to the patient, borderline schizophrenia.   GYNECOLOGIC HISTORY: (Updated January 2015) Menarche at age 44 She is G1, P1 with first pregnancy to term at age 44 She was not menopausal, and was still having regular periods at the time of her breast cancer diagnosis. She continues on every three-month goserelin.   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 She's currently unemployed. Her husband, CGlendell [name] works as a lDevelopment worker, international aid  Crystal [name]'s mother  also lives with them.  [place] has a son, ALiane Comber who is 267as of 07/2018 and also lives in [place] currently with the patient's parents.  He works as a delivery for dAdministrator, sports  The patient attends a local [name].    ADVANCED DIRECTIVES: In the absence of any documentation to the contrary, the patient's spouse is their HCPOA.    HEALTH MAINTENANCE:  Social History   Tobacco Use  . Smoking status: Current Every Day Smoker    Packs/day: 1.00    Years: 20.00    Pack years: 20.00    Types: Cigarettes  . Smokeless tobacco: Never Used  Vaping Use  . Vaping Use: Never used  Substance Use Topics  . Alcohol use: Yes    Comment: occ. binge drinker monthly or less  . Drug use: No    Comment: LAST USED COCAINE AND MARIJIUANA 2011     Colonoscopy: Never  PAP: July 2012  Bone density:  Never  Lipid panel: Not on file  Allergies  Allergen Reactions  . Tramadol Other (See Comments)    Seizures  . Phenytoin Nausea And Vomiting  . Thorazine [Chlorpromazine Hcl] Hives  . Ciprofloxacin Nausea And Vomiting  . Vancomycin Rash and Other (See Comments)    Possible rash reported by MD    Current Outpatient Medications  Medication Sig Dispense Refill  . albuterol (PROVENTIL HFA;VENTOLIN HFA) 108 (90 Base) MCG/ACT inhaler Inhale 2 puffs into the lungs every 4 (four) hours as needed for wheezing or shortness of breath.   0  . ALPRAZolam (XANAX) 2 MG tablet Take 1 tablet (2 mg total) by mouth 2 (two) times daily. For anxiety 8 tablet 0  . carbamazepine (TEGRETOL) 100 MG chewable tablet Chew 2 tablets (200 mg total) by mouth 3 (three) times daily. For mood stabilization 45 tablet 1  . esomeprazole (NEXIUM) 20 MG capsule Take 20 mg by mouth daily before breakfast.     . gabapentin (NEURONTIN) 600 MG tablet Take 3 tablets (1,800 mg total) by mouth daily. For agitation 45 tablet 1  . hydrOXYzine (ATARAX/VISTARIL) 25 MG tablet Take 1 tablet (25 mg total) by  mouth 3 (three) times daily as needed for anxiety. 75 tablet 0  . hydrOXYzine (ATARAX/VISTARIL) 50 MG tablet Take 1 tablet (50 mg total) by mouth at bedtime. For anxiety/sleep 30 tablet 0  .  mirtazapine (REMERON) 30 MG tablet Take 1 tablet (30 mg total) by mouth at bedtime. For depression/sleep 30 tablet 0  . nicotine (NICODERM CQ - DOSED IN MG/24 HOURS) 21 mg/24hr patch Place 1 patch (21 mg total) onto the skin daily. (May buy from over the counter): For Smoking cessation 28 patch 0  . polyethylene glycol (MIRALAX / GLYCOLAX) 17 g packet Take 17 g by mouth daily. (May buy from over the counter): Constipation 14 each 0  . senna (SENOKOT) 8.6 MG TABS tablet Take 1 tablet (8.6 mg total) by mouth at bedtime as needed. (May buy from over the counter): For constipation 120 tablet 0  . traZODone (DESYREL) 100 MG tablet Take 1 tablet (100 mg total) by mouth at bedtime. For sleep 30 tablet 0   No current facility-administered medications for this visit.   Facility-Administered Medications Ordered in Other Visits  Medication Dose Route Frequency Provider Last Rate Last Admin  . goserelin (ZOLADEX) injection 10.8 mg  10.8 mg Subcutaneous Q90 days Meliya Mcconahy, Virgie Dad, MD   10.8 mg at 02/02/16 1501    OBJECTIVE: white woman no acute distress  Vitals:   03/24/20 1311  BP: (!) 121/95  Pulse: 84  Resp: 18  Temp: 99.5 F (37.5 C)  SpO2: 100%  ECOG: 2 Body mass index is 22.4 kg/m. Filed Weights   03/24/20 1311  Weight: 130 lb 8 oz (59.2 kg)    Sclerae unicteric, EOMs intact Wearing a mask No cervical or supraclavicular adenopathy Lungs no rales or rhonchi Heart regular rate and rhythm Abd soft, nontender, positive bowel sounds MSK no focal spinal tenderness, no upper extremity lymphedema Neuro: nonfocal, well oriented, appropriate affect Breasts: Status post bilateral mastectomies.  There is no evidence of chest wall recurrence Skin: The area where she experiences some itching in the upper  anterior right chest is entirely unremarkable by exam    LAB RESULTS: Lab Results  Component Value Date   WBC 9.7 03/24/2020   NEUTROABS 6.0 03/24/2020   HGB 15.6 (H) 03/24/2020   HCT 45.4 03/24/2020   MCV 96.8 03/24/2020   PLT 223 03/24/2020      Chemistry      Component Value Date/Time   NA 143 03/24/2020 1241   NA 140 01/03/2017 1420   K 4.3 03/24/2020 1241   K 3.5 01/03/2017 1420   CL 106 03/24/2020 1241   CL 105 10/11/2012 1441   CO2 27 03/24/2020 1241   CO2 25 01/03/2017 1420   BUN 14 03/24/2020 1241   BUN 15.2 01/03/2017 1420   CREATININE 1.01 (H) 03/24/2020 1241   CREATININE 0.9 01/03/2017 1420      Component Value Date/Time   CALCIUM 9.5 03/24/2020 1241   CALCIUM 9.7 01/03/2017 1420   ALKPHOS 60 03/24/2020 1241   ALKPHOS 115 01/03/2017 1420   AST 26 03/24/2020 1241   AST 31 01/03/2017 1420   ALT 25 03/24/2020 1241   ALT 27 01/03/2017 1420   BILITOT 0.4 03/24/2020 1241   BILITOT 0.41 01/03/2017 1420      STUDIES: No results found.   ASSESSMENT: 44 y.o.   woman, known to be BRCA1 and 2 negative,   (1) status post bilateral mastectomies May of 2012 for a right-sided T1c N1 (stage II) grade 3 invasive ductal carcinoma,estrogen and progesterone receptor positive, HER-2 amplified, with an MIB-1 of 85%.   (2) status post carboplatin/ docetaxol/ trastuzumab x6, completed mid December 2012   (3) on trastuzumab every 3 weeks, started August of  2012, interrupted between April and August of 2013, completed 04/27/2012. Most recent echo 08/20/2012  (4) status post radiation therapy, completed 07/12/2011  (5) On tamoxifen starting April 2013, held as of 12/27/2012 with the diagnosis of a left upper extremity DVT.  On therapeutic Coumadin until  May 2015 when port removed and coumadin discontinued  (6) On goserelin given every 3 months, 1st injection on 10/26/11, discontinued June 2018 at patient's discretion  (7) started on anastrozole November 2014,  discontinued January 2015 because of side effects  (8) started letrozole August 2015, discontinued March 2018 at patient's discretion  Other problems:  (1) Chronic Pain/ fibromyalgia  (2) history of seizure disorder   (3) history of severe migraines   (4)  Severe proteincalorie malnutrition: resolved   PLAN:  Mar[place]a is now a little over 9 years out from definitive surgery for her breast cancer with no evidence of disease recurrence.  This is very favorable.  I am glad that she is more stable as far as her mood is concerned.  She has gained some weight which is very favorable.  She needs to increase her exercise program and we discussed that today.  She will see me 1 last time October of next year.  At that point she will be a little over 10 years out from definitive surgery and will be ready to "graduate".  For the itching she is experiences I suggested she use Benadryl cream  She has been unable to quit smoking.  As that is what is driving her increase in red cell count.  Total encounter time 20 minutes.*.   Neelah Mannings, Virgie Dad, MD  03/24/20 1:31 PM Medical Oncology and Hematology [facility] [place], [place] [address] Tel. [phone]    Fax. [phone]    I, Wilburn Mylar, am acting as scribe for Dr. Virgie Dad. Jasiya Markie.  I, Lurline Del MD, have reviewed the above documentation for accuracy and completeness, and I agree with the above.    *Total Encounter Time as defined by the Centers for Medicare and Medicaid Services includes, in addition to the face-to-face time of a patient visit (documented in the note above) non-face-to-face time: obtaining and reviewing outside history, ordering and reviewing medications, tests or procedures, care coordination (communications with other health care professionals or caregivers) and documentation in the medical record.

## 2020-03-24 ENCOUNTER — Inpatient Hospital Stay (HOSPITAL_BASED_OUTPATIENT_CLINIC_OR_DEPARTMENT_OTHER): Payer: Medicare Other | Admitting: Oncology

## 2020-03-24 ENCOUNTER — Inpatient Hospital Stay: Payer: Medicare Other | Attending: Oncology

## 2020-03-24 ENCOUNTER — Other Ambulatory Visit: Payer: Self-pay

## 2020-03-24 VITALS — BP 121/95 | HR 84 | Temp 99.5°F | Resp 18 | Ht 64.0 in | Wt 130.5 lb

## 2020-03-24 DIAGNOSIS — G8929 Other chronic pain: Secondary | ICD-10-CM | POA: Diagnosis not present

## 2020-03-24 DIAGNOSIS — C50411 Malignant neoplasm of upper-outer quadrant of right female breast: Secondary | ICD-10-CM | POA: Diagnosis not present

## 2020-03-24 DIAGNOSIS — Z9013 Acquired absence of bilateral breasts and nipples: Secondary | ICD-10-CM | POA: Insufficient documentation

## 2020-03-24 DIAGNOSIS — Z79899 Other long term (current) drug therapy: Secondary | ICD-10-CM | POA: Insufficient documentation

## 2020-03-24 DIAGNOSIS — Z9223 Personal history of estrogen therapy: Secondary | ICD-10-CM | POA: Insufficient documentation

## 2020-03-24 DIAGNOSIS — Z923 Personal history of irradiation: Secondary | ICD-10-CM | POA: Diagnosis not present

## 2020-03-24 DIAGNOSIS — Z853 Personal history of malignant neoplasm of breast: Secondary | ICD-10-CM | POA: Diagnosis not present

## 2020-03-24 DIAGNOSIS — Z170 Estrogen receptor positive status [ER+]: Secondary | ICD-10-CM

## 2020-03-24 DIAGNOSIS — F17210 Nicotine dependence, cigarettes, uncomplicated: Secondary | ICD-10-CM | POA: Diagnosis not present

## 2020-03-24 DIAGNOSIS — Z86718 Personal history of other venous thrombosis and embolism: Secondary | ICD-10-CM | POA: Diagnosis not present

## 2020-03-24 DIAGNOSIS — M797 Fibromyalgia: Secondary | ICD-10-CM | POA: Diagnosis not present

## 2020-03-24 DIAGNOSIS — Z9221 Personal history of antineoplastic chemotherapy: Secondary | ICD-10-CM | POA: Insufficient documentation

## 2020-03-24 LAB — COMPREHENSIVE METABOLIC PANEL
ALT: 25 U/L (ref 0–44)
AST: 26 U/L (ref 15–41)
Albumin: 4.1 g/dL (ref 3.5–5.0)
Alkaline Phosphatase: 60 U/L (ref 38–126)
Anion gap: 10 (ref 5–15)
BUN: 14 mg/dL (ref 6–20)
CO2: 27 mmol/L (ref 22–32)
Calcium: 9.5 mg/dL (ref 8.9–10.3)
Chloride: 106 mmol/L (ref 98–111)
Creatinine, Ser: 1.01 mg/dL — ABNORMAL HIGH (ref 0.44–1.00)
GFR, Estimated: >60 mL/min (ref >60)
Glucose, Bld: 71 mg/dL (ref 70–99)
Potassium: 4.3 mmol/L (ref 3.5–5.1)
Sodium: 143 mmol/L (ref 135–145)
Total Bilirubin: 0.4 mg/dL (ref 0.3–1.2)
Total Protein: 7 g/dL (ref 6.5–8.1)

## 2020-03-24 LAB — CBC WITH DIFFERENTIAL/PLATELET
Abs Immature Granulocytes: 0.01 10*3/uL (ref 0.00–0.07)
Basophils Absolute: 0 10*3/uL (ref 0.0–0.1)
Basophils Relative: 0 %
Eosinophils Absolute: 0.2 10*3/uL (ref 0.0–0.5)
Eosinophils Relative: 2 %
HCT: 45.4 % (ref 36.0–46.0)
Hemoglobin: 15.6 g/dL — ABNORMAL HIGH (ref 12.0–15.0)
Immature Granulocytes: 0 %
Lymphocytes Relative: 29 %
Lymphs Abs: 2.8 10*3/uL (ref 0.7–4.0)
MCH: 33.3 pg (ref 26.0–34.0)
MCHC: 34.4 g/dL (ref 30.0–36.0)
MCV: 96.8 fL (ref 80.0–100.0)
Monocytes Absolute: 0.7 10*3/uL (ref 0.1–1.0)
Monocytes Relative: 7 %
Neutro Abs: 6 10*3/uL (ref 1.7–7.7)
Neutrophils Relative %: 62 %
Platelets: 223 10*3/uL (ref 150–400)
RBC: 4.69 MIL/uL (ref 3.87–5.11)
RDW: 12.2 % (ref 11.5–15.5)
WBC: 9.7 10*3/uL (ref 4.0–10.5)
nRBC: 0 % (ref 0.0–0.2)

## 2020-03-24 MED ORDER — DIPHENHYDRAMINE-ZINC ACETATE 2-0.1 % EX CREA: 1.0000 "application " | TOPICAL_CREAM | Freq: Three times a day (TID) | CUTANEOUS | 0 refills | Status: DC | PRN

## 2020-03-25 ENCOUNTER — Telehealth: Payer: Self-pay | Admitting: Oncology

## 2020-03-25 LAB — THYROID PANEL WITH TSH
Free Thyroxine Index: 1.6 (ref 1.2–4.9)
T3 Uptake Ratio: 23 % — ABNORMAL LOW (ref 24–39)
T4, Total: 6.8 ug/dL (ref 4.5–12.0)
TSH: 1.75 u[IU]/mL (ref 0.450–4.500)

## 2020-03-25 NOTE — Telephone Encounter (Signed)
Scheduled appts per 11/23 los. Pt confirmed appt date and time.

## 2020-07-08 ENCOUNTER — Telehealth: Payer: Self-pay | Admitting: Family Medicine

## 2020-07-08 NOTE — Telephone Encounter (Signed)
Patient states that [facility] said it was okay to re-establish care with her  Can we set up the appt?? Please advise

## 2020-07-09 NOTE — Telephone Encounter (Signed)
LVM for patient to call us and set up appt

## 2020-07-09 NOTE — Telephone Encounter (Signed)
Please refer her to provider accepting new patients. I do not see previous documentation of accepting this patient.

## 2020-07-09 NOTE — Telephone Encounter (Signed)
Re-explaining patient stating re-establishing care with Crystal [name].  Patients significant other is Crystal [name] patient.   Was giving the OK to schedule NP appointment after further explanation

## 2020-08-21 NOTE — Progress Notes (Addendum)
[place] at [facility] [address], [place], [place] [address] [phone]  Date:  08/24/2020   Name:  Crystal [name]   DOB:  1976/01/13   MRN:  [id number]  PCP:  Darreld Mclean, MD    Chief Complaint: New Patient (Initial Visit) (Coughing black mucus, hx smoker) and Food Cravings (Craving sweets)   History of Present Illness:  Crystal [name] is a 45 y.o. very pleasant female patient who presents with the following:  Returning patient here today to establish care.  I have taken care of [place] previously at my prior medical practice-our last visit was in 2015  History of breast cancer diagnosed 2012, she has since had bilateral mastectomy Gerald Stabs unfortunately also suffers from significant mental illness, depression and occasional suicidally  Most recent oncology appt in November- 9 years stable, polycythemia due to smoking.  Stable from an oncology standpoint   Pap screening- update today  Routine labs can be updated if she likes    She does have psychiatry care - she is seeing Sherlene Shams at [facility] She also does have a counselor but has not seen them in some time She is doing well on her current medications but olanzapine does cause weight gain which is upsetting to her   Her son does have anxiety and depression himself- he is 43 yo   Encouraged her to exercise-  She is thinking of getting back to the gym   Wt Readings from Last 3 Encounters:  08/24/20 147 lb (66.7 kg)  03/24/20 130 lb 8 oz (59.2 kg)  08/20/19 120 lb (54.4 kg)   LMP 08/17/20  Crystal [name] is a smoker- she may cough up some dark colored mucus on occasion, has noted this for about 2 years  Patient Active Problem List   Diagnosis Date Noted  . MDD (major depressive disorder) 08/21/2019  . Severe recurrent major depression without psychotic features ([place]) 08/21/2019  . Suicide attempt ([place]) 08/21/2019  . Osteonecrosis of hip ([place]) 06/13/2018   . Tibial plateau fracture, right, closed, initial encounter 11/27/2017  . Anorexia nervosa 08/19/2014  . Back pain 08/19/2014  . Malnutrition, calorie ([place]) 12/03/2013  . Migraines 05/21/2013  . Breast cancer of upper-outer quadrant of right female breast ([place]) 03/21/2013  . Chronic pain 01/09/2013  . DVT of upper extremity (deep vein thrombosis) ([place]) 01/02/2013  . Arm edema 12/27/2012  . Neuropathic pain 12/27/2012  . Chronic female pelvic pain 08/21/2012  . Fatigue 08/20/2012  . Chemotherapy induced cardiomyopathy ([place]) 09/08/2011  . Generalized convulsive seizure ([place]) 07/13/2011  . Seizure disorder ([place]) 07/13/2011  . Migraine 07/13/2011  . Anxiety disorder 07/13/2011  . Depression 07/13/2011  . Hx MRSA infection 04/13/2011    Past Medical History:  Diagnosis Date  . Alcohol abuse   . Anorexia nervosa   . Anxiety   . Avascular necrosis of bone of hip ([place])   . Bipolar affective disorder, mixed ([place])   . Cancer ([place])   . Chronic pain   . Depression   . Dilated cardiomyopathy secondary to drug ([place])   . DJD (degenerative joint disease)    "BONE STICKS OUT ON LEFT PART OF LOWER BACK"  . DVT of upper extremity (deep vein thrombosis) ([place]) 2014  . Edema of upper extremity   . Family history of anesthesia complication    MOTHER PONV  . Frequency of urination   . GERD (gastroesophageal reflux disease)  OCCASIONALLY TAKE ZANTAC  . History of breast cancer DX JUNE 2012 W/ RIGHT BREAST INVASIVE DUCTAL CA  STAGE IIB---  S/P BILATERAL MASTECTOMIES AND RIGHT NODE DISSECTION   ONCOLOGIST-- DR Jana Hakim--  CHEMO ENDED [place] 2012 / RADIATION ENDED MAR 2013--  CURRENT ON TAMOXIFEN AND ZOLADEX  . History of idiopathic seizure    DX 2008 --  LAST ONE 2012--  NO ISSUES SINCE AND NO MEDS. -- PT STATES DOCTORS FELT IT WAS STRESS RELATED DUE TO HEALTH ISSUES  . Hot flashes due to tamoxifen   . Hx MRSA infection 03/28/11   Skin  . Left ovarian cyst   . Migraines   . Neuropathy   .  Nocturia   . Right lower quadrant abdominal pain   . Seizures ([place])    2016 last seizure   . Status post chemotherapy    docetaxel/carboplatin/trastuzumab  . Strains to urinate   . Urgency of urination     Past Surgical History:  Procedure Laterality Date  . BIOPSY BREAST  10/14/2010   right needle core biopsy  . BREAST SURGERY  11/16/2010   bilateral mastectomy+ right axillary node dissection,T1cN1a, Her2+,ERPR+  (right breast invasive ductal carcinoma)  . CYSTO WITH HYDRODISTENSION N/A 07/23/2012   Procedure: CYSTOSCOPY/HYDRODISTENSION instillation of marcaine and pyridium;  Surgeon: Reece Packer, MD;  Location: [facility];  Service: Urology;  Laterality: N/A;  INSTILLATION    . HARDWARE REMOVAL Right 12/03/2018   Procedure: Right tibia screw removal;  Surgeon: Gaynelle Arabian, MD;  Location: [facility];  Service: Orthopedics;  Laterality: Right;  52min  . I & D EXTREMITY  09/26/2011   Procedure: IRRIGATION AND DEBRIDEMENT EXTREMITY;  Surgeon: Tennis Must, MD;  Location: [facility];  Service: Orthopedics;  Laterality: Right;  I&D right hand cat bite wound  . ORIF TIBIA PLATEAU Right 11/27/2017   Procedure: OPEN REDUCTION INTERNAL FIXATION (ORIF) TIBIAL PLATEAU FRACTURE;  Surgeon: Gaynelle Arabian, MD;  [facility];  Service: Orthopedics;  Laterality: Right;  . PORTACATH PLACEMENT  11/16/2010   placement of left subclavian port NOW REMOVED  . right knee torn meniscus surgery     . TOTAL HIP ARTHROPLASTY Left 06/13/2018   Procedure: TOTAL HIP ARTHROPLASTY ANTERIOR APPROACH;  Surgeon: Gaynelle Arabian, MD;  [facility];  Service: Orthopedics;  Laterality: Left;  161min  . TRANSTHORACIC ECHOCARDIOGRAM  05-28-2012   LVF NORMAL/  EF 55-60%  . WISDOM TOOTH EXTRACTION      Social History   Tobacco Use  . Smoking status: Current Every Day Smoker    Packs/day: 1.00    Years: 20.00    Pack years: 20.00    Types: Cigarettes  . Smokeless tobacco: Never Used  Vaping Use   . Vaping Use: Never used  Substance Use Topics  . Alcohol use: Yes    Comment: 3 times a week  . Drug use: No    Comment: LAST USED COCAINE AND [place] 2011    Family History  Problem Relation Age of Onset  . Cancer Maternal Grandfather   . Mental illness Brother   . Mental illness Son     Allergies  Allergen Reactions  . Tramadol Other (See Comments)    Seizures  . Phenytoin Nausea And Vomiting  . Thorazine [Chlorpromazine Hcl] Hives  . Ciprofloxacin Nausea And Vomiting  . Vancomycin Rash and Other (See Comments)    Possible rash reported by MD    Medication list has been reviewed and updated.  Current Outpatient  Medications on File Prior to Visit  Medication Sig Dispense Refill  . albuterol (PROVENTIL HFA;VENTOLIN HFA) 108 (90 Base) MCG/ACT inhaler Inhale 2 puffs into the lungs every 4 (four) hours as needed for wheezing or shortness of breath.   0  . ALPRAZolam (XANAX) 2 MG tablet Take 1 tablet (2 mg total) by mouth 2 (two) times daily. For anxiety 8 tablet 0  . Eszopiclone (ESZOPICLONE) 3 MG TABS Take 3 mg by mouth at bedtime. Take immediately before bedtime    . gabapentin (NEURONTIN) 600 MG tablet Take 3 tablets (1,800 mg total) by mouth daily. For agitation (Patient taking differently: Take 1,800 mg by mouth daily. 3 tablets in morning, 2 tablets at night) 45 tablet 1  . OLANZapine-Samidorphan (LYBALVI) 5-10 MG TABS Take by mouth at bedtime.    . polyethylene glycol (MIRALAX / GLYCOLAX) 17 g packet Take 17 g by mouth daily. (May buy from over the counter): Constipation 14 each 0  . traZODone (DESYREL) 50 MG tablet Take 50 mg by mouth at bedtime.     Current Facility-Administered Medications on File Prior to Visit  Medication Dose Route Frequency Provider Last Rate Last Admin  . goserelin (ZOLADEX) injection 10.8 mg  10.8 mg Subcutaneous Q90 days [name], Virgie Dad, MD   10.8 mg at 02/02/16 1501    Review of Systems:  As per HPI- otherwise  negative.   Physical Examination: Vitals:   08/24/20 0937  BP: 112/62  Pulse: (!) 51  Resp: 18  Temp: 97.6 F (36.4 C)  SpO2: 98%   Vitals:   08/24/20 0937  Weight: 147 lb (66.7 kg)  Height: _0  (1.676 m)   Body mass index is 23.73 kg/m. Ideal Body Weight: Weight in (lb) to have BMI = 25: 154.6  GEN: no acute distress.  Normal weight looks well S/p bilateral mastectomy  HEENT: Atraumatic, Normocephalic.  Ears and Nose: No external deformity. CV: RRR, No M/G/R. No JVD. No thrill. No extra heart sounds. PULM: CTA B, no wheezes, crackles, rhonchi. No retractions. No resp. distress. No accessory muscle use. ABD: S, NT, ND, +BS. No rebound. No HSM. EXTR: No c/c/e PSYCH: Normally interactive. Conversant.  Pelvic: normal, no vaginal lesions or discharge. Uterus normal, no CMT, no adnexal tendereness or masses    Assessment and Plan: Productive cough - Plan: DG Chest 2 View  Screening for cervical cancer - Plan: Cytology - PAP  Fatigue, unspecified type - Plan: CBC  Weight gain - Plan: TSH  Screening for diabetes mellitus - Plan: Basic metabolic panel, Hemoglobin A1c  Screening for hyperlipidemia - Plan: Lipid panel  Elevated glucose - Plan: Hemoglobin A1c  Re-establish care today Pap and labs pending Reassured that her weight is normal.  Exercise is a good idea.  Her mental health is paramount Will obtain a chest film due to chronically productive cough.  Encouraged her to quit smoking Will plan further follow- up pending labs.  This visit occurred during the SARS-CoV-2 public health emergency.  Safety protocols were in place, including screening questions prior to the visit, additional usage of staff PPE, and extensive cleaning of exam room while observing appropriate contact time as indicated for disinfecting solutions.    Signed Lamar Blinks, MD  Addendum 4/26, received her labs as below- called patient and LMOM Letter to pt  DG Chest 2 View  Result  Date: 08/24/2020 CLINICAL DATA:  Coughing up mucus. EXAM: CHEST - 2 VIEW COMPARISON:  05/29/2018. FINDINGS: The heart size and mediastinal contours  are within normal limits. Both lungs are clear. No visible pleural effusions or pneumothorax. Right apical pleuroparenchymal scarring, similar over multiple priors. No acute osseous abnormality. Bilateral mastectomies. Clips project in the lateral right chest/axillary region. IMPRESSION: No active cardiopulmonary disease. Electronically Signed   By: Margaretha Sheffield MD   On: 08/24/2020 10:39     Results for orders placed or performed in visit on 08/24/20  CBC  Result Value Ref Range   WBC 9.8 4.0 - 10.5 K/uL   RBC 4.67 3.87 - 5.11 Mil/uL   Platelets 207.0 150.0 - 400.0 K/uL   Hemoglobin 15.3 (H) 12.0 - 15.0 g/dL   HCT 44.0 36.0 - 46.0 %   MCV 94.2 78.0 - 100.0 fl   MCHC 34.8 30.0 - 36.0 g/dL   RDW 12.4 11.5 - 96.9 %  Basic metabolic panel  Result Value Ref Range   Sodium 140 135 - 145 mEq/L   Potassium 4.2 3.5 - 5.1 mEq/L   Chloride 106 96 - 112 mEq/L   CO2 20 19 - 32 mEq/L   Glucose, Bld 72 70 - 99 mg/dL   BUN 13 6 - 23 mg/dL   Creatinine, Ser 0.82 0.40 - 1.20 mg/dL   GFR 88.01 >60.00 mL/min   Calcium 9.2 8.4 - 10.5 mg/dL  Hemoglobin A1c  Result Value Ref Range   Hgb A1c MFr Bld 4.9 4.6 - 6.5 %  Lipid panel  Result Value Ref Range   Cholesterol 214 (H) 0 - 200 mg/dL   Triglycerides 115.0 0.0 - 149.0 mg/dL   HDL 65.20 >39.00 mg/dL   VLDL 23.0 0.0 - 40.0 mg/dL   LDL Cholesterol 126 (H) 0 - 99 mg/dL   Total CHOL/HDL Ratio 3    NonHDL 148.67   TSH  Result Value Ref Range   TSH 2.23 0.35 - 4.50 uIU/mL

## 2020-08-24 ENCOUNTER — Other Ambulatory Visit: Payer: Self-pay

## 2020-08-24 ENCOUNTER — Telehealth: Payer: Self-pay | Admitting: Family Medicine

## 2020-08-24 ENCOUNTER — Ambulatory Visit (INDEPENDENT_AMBULATORY_CARE_PROVIDER_SITE_OTHER): Payer: Medicare Other | Admitting: Family Medicine

## 2020-08-24 ENCOUNTER — Other Ambulatory Visit (HOSPITAL_COMMUNITY)
Admission: RE | Admit: 2020-08-24 | Discharge: 2020-08-24 | Disposition: A | Discharge status: Home / Self Care | Payer: Medicare Other | Source: Ambulatory Visit | Attending: Family Medicine | Admitting: Family Medicine

## 2020-08-24 ENCOUNTER — Encounter: Payer: Self-pay | Admitting: Family Medicine

## 2020-08-24 ENCOUNTER — Ambulatory Visit (HOSPITAL_BASED_OUTPATIENT_CLINIC_OR_DEPARTMENT_OTHER)
Admission: RE | Admit: 2020-08-24 | Discharge: 2020-08-24 | Disposition: A | Discharge status: Home / Self Care | Payer: Medicare Other | Source: Ambulatory Visit | Attending: Family Medicine | Admitting: Family Medicine

## 2020-08-24 VITALS — BP 112/62 | HR 51 | Temp 97.6°F | Resp 18 | Ht 66.0 in | Wt 147.0 lb

## 2020-08-24 DIAGNOSIS — Z131 Encounter for screening for diabetes mellitus: Secondary | ICD-10-CM | POA: Diagnosis not present

## 2020-08-24 DIAGNOSIS — Z124 Encounter for screening for malignant neoplasm of cervix: Secondary | ICD-10-CM | POA: Insufficient documentation

## 2020-08-24 DIAGNOSIS — R635 Abnormal weight gain: Secondary | ICD-10-CM

## 2020-08-24 DIAGNOSIS — R5383 Other fatigue: Secondary | ICD-10-CM

## 2020-08-24 DIAGNOSIS — R058 Other specified cough: Secondary | ICD-10-CM

## 2020-08-24 DIAGNOSIS — Z13220 Encounter for screening for lipoid disorders: Secondary | ICD-10-CM | POA: Diagnosis not present

## 2020-08-24 DIAGNOSIS — Z1151 Encounter for screening for human papillomavirus (HPV): Secondary | ICD-10-CM | POA: Diagnosis not present

## 2020-08-24 DIAGNOSIS — R7309 Other abnormal glucose: Secondary | ICD-10-CM | POA: Diagnosis not present

## 2020-08-24 DIAGNOSIS — Z0000 Encounter for general adult medical examination without abnormal findings: Secondary | ICD-10-CM | POA: Diagnosis not present

## 2020-08-24 LAB — CBC
HCT: 44 % (ref 36.0–46.0)
Hemoglobin: 15.3 g/dL — ABNORMAL HIGH (ref 12.0–15.0)
MCHC: 34.8 g/dL (ref 30.0–36.0)
MCV: 94.2 fl (ref 78.0–100.0)
Platelets: 207 10*3/uL (ref 150.0–400.0)
RBC: 4.67 Mil/uL (ref 3.87–5.11)
RDW: 12.4 % (ref 11.5–15.5)
WBC: 9.8 10*3/uL (ref 4.0–10.5)

## 2020-08-24 LAB — HEMOGLOBIN A1C: Hgb A1c MFr Bld: 4.9 % (ref 4.6–6.5)

## 2020-08-24 LAB — TSH: TSH: 2.23 u[IU]/mL (ref 0.35–4.50)

## 2020-08-24 LAB — LIPID PANEL
Cholesterol: 214 mg/dL — ABNORMAL HIGH (ref 0–200)
HDL: 65.2 mg/dL (ref >39.00)
LDL Cholesterol: 126 mg/dL — ABNORMAL HIGH (ref 0–99)
NonHDL: 148.67
Total CHOL/HDL Ratio: 3
Triglycerides: 115 mg/dL (ref 0.0–149.0)
VLDL: 23 mg/dL (ref 0.0–40.0)

## 2020-08-24 LAB — BASIC METABOLIC PANEL
BUN: 13 mg/dL (ref 6–23)
CO2: 20 mEq/L (ref 19–32)
Calcium: 9.2 mg/dL (ref 8.4–10.5)
Chloride: 106 mEq/L (ref 96–112)
Creatinine, Ser: 0.82 mg/dL (ref 0.40–1.20)
GFR: 88.01 mL/min (ref >60.00)
Glucose, Bld: 72 mg/dL (ref 70–99)
Potassium: 4.2 mEq/L (ref 3.5–5.1)
Sodium: 140 mEq/L (ref 135–145)

## 2020-08-24 NOTE — Patient Instructions (Addendum)
It was good to see you again today!  I do think exercise is a good idea for your health and mood- however your weight is actually in normal range at this time.  We don't want you to get too thin again!  I also ordered routine labs and a chest film for you today- please go by the lab and then to the ground floor imaging dept for your chest film   We will be in touch with your labs and pap asap

## 2020-08-24 NOTE — Telephone Encounter (Signed)
Patient is calling in reference to imaging results, patient would like a call back to go over them

## 2020-08-24 NOTE — Telephone Encounter (Signed)
Patient has chest xray this morning. Will advise patient of results once commented on.

## 2020-08-26 ENCOUNTER — Encounter: Payer: Self-pay | Admitting: Family Medicine

## 2020-08-26 LAB — CYTOLOGY - PAP
Comment: NEGATIVE
Diagnosis: NEGATIVE
High risk HPV: NEGATIVE

## 2020-12-04 NOTE — Progress Notes (Signed)
[place] at [facility] [address], [place], [place] [address] [phone]  Date:  12/09/2020   Name:  Crystal [name]   DOB:  1975-11-01   MRN:  [id number]  PCP:  Darreld Mclean, MD    Chief Complaint: Cough (Chest xray normal, productive green mucous clearing, constant clearing throat, trying mucinex and robitussin, snoring, breath odor)   History of Present Illness:  Crystal [name] is a 45 y.o. very pleasant female patient who presents with the following:  Patient seen today with possible thyroid problem Most recent visit with myself in [place] that time we were re-establishing care, I previously seen her in 2015  History of breast cancer diagnosed 2012, she has since had bilateral mastectomy Ch[place] also suffers from significant mental illness, depression and occasional suicidally She is currently under care of [facility] and has been stable recently Unfortunately, her husband Glendell Docker is not doing that well right now- he is having a flare of his psychiatric symptoms. They do have a plan for him to get care and he denies any SI   Her son is in his mid 76s   We got blood work in [place] did check a TSH at that time which was normal  Can do hepatitis C screening with routine labs She has cut down on smoking recently but it is hard with all the stress she is under- she is not able to work on quitting totally right now   She has noted some chest and throat congestion, cough Sometimes she feels like food gets stuck when she swallows- she thinks this may have gone on for about a month She is coughing up green material for about 2 months She sometimes feel like she is wheezing   We did do a CXR 4/25- negative  Patient Active Problem List   Diagnosis Date Noted   MDD (major depressive disorder) 08/21/2019   Severe recurrent major depression without psychotic features ([place]) 08/21/2019   Suicide attempt ([place])  08/21/2019   Osteonecrosis of hip ([place]) 06/13/2018   Tibial plateau fracture, right, closed, initial encounter 11/27/2017   Anorexia nervosa 08/19/2014   Back pain 08/19/2014   Malnutrition, calorie ([place]) 12/03/2013   Migraines 05/21/2013   Breast cancer of upper-outer quadrant of right female breast ([place]) 03/21/2013   Chronic pain 01/09/2013   DVT of upper extremity (deep vein thrombosis) ([place]) 01/02/2013   Arm edema 12/27/2012   Neuropathic pain 12/27/2012   Chronic female pelvic pain 08/21/2012   Fatigue 08/20/2012   Chemotherapy induced cardiomyopathy ([place]) 09/08/2011   Generalized convulsive seizure ([place]) 07/13/2011   Seizure disorder ([place]) 07/13/2011   Migraine 07/13/2011   Anxiety disorder 07/13/2011   Depression 07/13/2011   Hx MRSA infection 04/13/2011    Past Medical History:  Diagnosis Date   Alcohol abuse    Anorexia nervosa    Anxiety    Avascular necrosis of bone of hip ([place])    Bipolar affective disorder, mixed ([place])    Cancer ([place])    Chronic pain    Depression    Dilated cardiomyopathy secondary to drug ([place])    DJD (degenerative joint disease)    "BONE STICKS OUT ON LEFT PART OF LOWER BACK"   DVT of upper extremity (deep vein thrombosis) ([place]) 2014   Edema of upper extremity    Family history of anesthesia complication    MOTHER PONV   Frequency of urination  GERD (gastroesophageal reflux disease)    OCCASIONALLY TAKE ZANTAC   History of breast cancer DX JUNE 2012 W/ RIGHT BREAST INVASIVE DUCTAL CA  STAGE IIB---  S/P BILATERAL MASTECTOMIES AND RIGHT NODE DISSECTION   ONCOLOGIST-- DR Jana Hakim--  CHEMO ENDED [place] 2012 / RADIATION ENDED MAR 2013--  CURRENT ON TAMOXIFEN AND ZOLADEX   History of idiopathic seizure    DX 2008 --  LAST ONE 2012--  NO ISSUES SINCE AND NO MEDS. -- PT STATES DOCTORS FELT IT WAS STRESS RELATED DUE TO HEALTH ISSUES   Hot flashes due to tamoxifen    Hx MRSA infection 03/28/11   Skin   Left ovarian cyst    Migraines     Neuropathy    Nocturia    Right lower quadrant abdominal pain    Seizures ([place])    2016 last seizure    Status post chemotherapy    docetaxel/carboplatin/trastuzumab   Strains to urinate    Urgency of urination     Past Surgical History:  Procedure Laterality Date   BIOPSY BREAST  10/14/2010   right needle core biopsy   BREAST SURGERY  11/16/2010   bilateral mastectomy+ right axillary node dissection,T1cN1a, Her2+,ERPR+  (right breast invasive ductal carcinoma)   CYSTO WITH HYDRODISTENSION N/A 07/23/2012   Procedure: CYSTOSCOPY/HYDRODISTENSION instillation of marcaine and pyridium;  Surgeon: Reece Packer, MD;  Location: [facility];  Service: Urology;  Laterality: N/A;  INSTILLATION     HARDWARE REMOVAL Right 12/03/2018   Procedure: Right tibia screw removal;  Surgeon: Gaynelle Arabian, MD;  [facility];  Service: Orthopedics;  Laterality: Right;  19mn   I & D EXTREMITY  09/26/2011   Procedure: IRRIGATION AND DEBRIDEMENT EXTREMITY;  Surgeon: KTennis Must MD;  [facility];  Service: Orthopedics;  Laterality: Right;  I&D right hand cat bite wound   ORIF TIBIA PLATEAU Right 11/27/2017   Procedure: OPEN REDUCTION INTERNAL FIXATION (ORIF) TIBIAL PLATEAU FRACTURE;  Surgeon: AGaynelle Arabian MD;  [facility];  Service: Orthopedics;  Laterality: Right;   PORTACATH PLACEMENT  11/16/2010   placement of left subclavian port NOW REMOVED   right knee torn meniscus surgery      TOTAL HIP ARTHROPLASTY Left 06/13/2018   Procedure: TOTAL HIP ARTHROPLASTY ANTERIOR APPROACH;  Surgeon: AGaynelle Arabian MD;  [facility];  Service: Orthopedics;  Laterality: Left;  10106m   TRANSTHORACIC ECHOCARDIOGRAM  05-28-2012   LVF NORMAL/  EF 55-60%   WISDOM TOOTH EXTRACTION      Social History   Tobacco Use   Smoking status: Every Day    Packs/day: 1.00    Years: 20.00    Pack years: 20.00    Types: Cigarettes   Smokeless tobacco: Never  Vaping Use   Vaping Use: Never  used  Substance Use Topics   Alcohol use: Yes    Comment: 3 times a week   Drug use: No    Comment: LAST USED COCAINE AND [place] 2011    Family History  Problem Relation Age of Onset   Cancer Maternal Grandfather    Mental illness Brother    Mental illness Son     Allergies  Allergen Reactions   Tramadol Other (See Comments)    Seizures   Phenytoin Nausea And Vomiting   Thorazine [Chlorpromazine Hcl] Hives   Ciprofloxacin Nausea And Vomiting   Vancomycin Rash and Other (See Comments)    Possible rash reported by MD    Medication list has been reviewed and  updated.  Current Outpatient Medications on File Prior to Visit  Medication Sig Dispense Refill   ALPRAZolam (XANAX) 1 MG tablet Take 1 mg by mouth 4 (four) times daily.     Eszopiclone 3 MG TABS Take 3 mg by mouth at bedtime. Take immediately before bedtime     gabapentin (NEURONTIN) 600 MG tablet Take 3 tablets (1,800 mg total) by mouth daily. For agitation (Patient taking differently: Take 1,800 mg by mouth daily. 3 tablets in morning, 2 tablets at night) 45 tablet 1   OLANZapine-Samidorphan (LYBALVI) 5-10 MG TABS Take by mouth at bedtime.     polyethylene glycol (MIRALAX / GLYCOLAX) 17 g packet Take 17 g by mouth daily. (May buy from over the counter): Constipation 14 each 0   traZODone (DESYREL) 50 MG tablet Take 50 mg by mouth at bedtime.     Current Facility-Administered Medications on File Prior to Visit  Medication Dose Route Frequency Provider Last Rate Last Admin   goserelin (ZOLADEX) injection 10.8 mg  10.8 mg Subcutaneous Q90 days [name], Virgie Dad, MD   10.8 mg at 02/02/16 1501    Review of Systems:  As per HPI- otherwise negative.  Pulse Readings from Last 3 Encounters:  12/09/20 (!) 107  08/24/20 (!) 51  03/24/20 84      Physical Examination: Vitals:   12/09/20 0914  BP: 116/80  Pulse: (!) 107  Resp: 17  Temp: 97.9 F (36.6 C)  SpO2: 98%   Vitals:   12/09/20 0914  Weight: 141 lb  (64 kg)  Height: 5' 6" (1.676 m)   Body mass index is 22.76 kg/m. Ideal Body Weight: Weight in (lb) to have BMI = 25: 154.6  GEN: no acute distress.  Looks well, normal weight HEENT: Atraumatic, Normocephalic.  Bilateral TM wnl, oropharynx normal.  PEERL,EOMI.   Ears and Nose: No external deformity. CV: RRR, No M/G/R. No JVD. No thrill. No extra heart sounds. PULM: CTA B, no wheezes, crackles, rhonchi. No retractions. No resp. distress. No accessory muscle use. EXTR: No c/c/e PSYCH: Normally interactive. Conversant.  She asked me to check a few "moles" today, these are benign skin tags  Assessment and Plan: Acute bronchitis, unspecified organism - Plan: albuterol (VENTOLIN HFA) 108 (90 Base) MCG/ACT inhaler, azithromycin (ZITHROMAX) 250 MG tablet, predniSONE (DELTASONE) 20 MG tablet Patient seen today with symptoms most likely due to bronchitis.  I have refilled her albuterol, prescribed azithromycin and prednisone.  She notes that she has taken prednisone in the recent past without any worsening of psychiatric symptoms.  She does notice a chronic throat clearing and occasionally the feeling that food is getting stuck in her throat.  If this does not resolve with current treatment we will plan to have this evaluated further due to her history of breast cancer and radiation.  She understands this plan and will let me know if symptoms do not resolve This visit occurred during the SARS-CoV-2 public health emergency.  Safety protocols were in place, including screening questions prior to the visit, additional usage of staff PPE, and extensive cleaning of exam room while observing appropriate contact time as indicated for disinfecting solutions.   Signed Lamar Blinks, MD

## 2020-12-09 ENCOUNTER — Other Ambulatory Visit: Payer: Self-pay

## 2020-12-09 ENCOUNTER — Ambulatory Visit (INDEPENDENT_AMBULATORY_CARE_PROVIDER_SITE_OTHER): Payer: Medicare Other | Admitting: Family Medicine

## 2020-12-09 ENCOUNTER — Encounter: Payer: Self-pay | Admitting: Family Medicine

## 2020-12-09 VITALS — BP 116/80 | HR 107 | Temp 97.9°F | Resp 17 | Ht 66.0 in | Wt 141.0 lb

## 2020-12-09 DIAGNOSIS — J209 Acute bronchitis, unspecified: Secondary | ICD-10-CM

## 2020-12-09 MED ORDER — PREDNISONE 20 MG PO TABS: ORAL_TABLET | ORAL | 0 refills | Status: DC

## 2020-12-09 MED ORDER — AZITHROMYCIN 250 MG PO TABS: ORAL_TABLET | ORAL | 0 refills | Status: AC

## 2020-12-09 MED ORDER — ALBUTEROL SULFATE HFA 108 (90 BASE) MCG/ACT IN AERS: 2.0000 | INHALATION_SPRAY | RESPIRATORY_TRACT | 2 refills | Status: DC | PRN

## 2020-12-09 NOTE — Patient Instructions (Signed)
It was good to see you today- I am sorry things are hard right now!    Please let me know if your symptoms of illness are not improving in the next 2-3 weeks - if you continue to have the throat clearing of feeling of food getting stuck we will need to look further

## 2021-01-05 NOTE — Patient Instructions (Addendum)
It was good to see you again today, please keep me posted!  We will refer you to see pulmonology to check on your lungs Work on quitting smoking- this is the best thing you can do for your lungs We will also set up for an echocardiogram to look at your heart shape and size- to make sure the right atrium is ok  For your current cough and congestion I prescribed doxycycline antibiotic and duoneb liquid to use in a nebulizer machine.  You should be able to buy this at [facility]/ [facility] or order from [facility]  Please ask the person who is prescribing your Adderall if you wish to try and get this medication replaced
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
## 2021-01-07 ENCOUNTER — Other Ambulatory Visit: Payer: Self-pay

## 2021-01-07 ENCOUNTER — Encounter: Payer: Self-pay | Admitting: Family Medicine

## 2021-01-07 ENCOUNTER — Ambulatory Visit (INDEPENDENT_AMBULATORY_CARE_PROVIDER_SITE_OTHER): Payer: Medicare Other | Admitting: Family Medicine

## 2021-01-07 VITALS — BP 122/80 | HR 97 | Temp 98.0°F | Resp 18 | Ht 66.0 in | Wt 137.0 lb

## 2021-01-07 DIAGNOSIS — R0989 Other specified symptoms and signs involving the circulatory and respiratory systems: Secondary | ICD-10-CM | POA: Diagnosis not present

## 2021-01-07 DIAGNOSIS — R9431 Abnormal electrocardiogram [ECG] [EKG]: Secondary | ICD-10-CM | POA: Diagnosis not present

## 2021-01-07 DIAGNOSIS — J209 Acute bronchitis, unspecified: Secondary | ICD-10-CM

## 2021-01-07 DIAGNOSIS — R059 Cough, unspecified: Secondary | ICD-10-CM

## 2021-01-07 DIAGNOSIS — Z5181 Encounter for therapeutic drug level monitoring: Secondary | ICD-10-CM

## 2021-01-07 MED ORDER — DOXYCYCLINE HYCLATE 100 MG PO CAPS: 100.0000 mg | ORAL_CAPSULE | Freq: Two times a day (BID) | ORAL | 0 refills | Status: DC

## 2021-01-07 MED ORDER — IPRATROPIUM-ALBUTEROL 0.5-2.5 (3) MG/3ML IN SOLN: 3.0000 mL | Freq: Four times a day (QID) | RESPIRATORY_TRACT | 1 refills | Status: DC | PRN

## 2021-01-11 ENCOUNTER — Other Ambulatory Visit (HOSPITAL_BASED_OUTPATIENT_CLINIC_OR_DEPARTMENT_OTHER): Payer: Self-pay | Admitting: Family Medicine

## 2021-01-11 DIAGNOSIS — R9431 Abnormal electrocardiogram [ECG] [EKG]: Secondary | ICD-10-CM

## 2021-01-12 ENCOUNTER — Encounter: Payer: Self-pay | Admitting: Family Medicine

## 2021-01-14 ENCOUNTER — Encounter: Payer: Self-pay | Admitting: Family Medicine

## 2021-01-14 ENCOUNTER — Other Ambulatory Visit (HOSPITAL_BASED_OUTPATIENT_CLINIC_OR_DEPARTMENT_OTHER): Payer: Medicare Other

## 2021-01-28 ENCOUNTER — Other Ambulatory Visit (HOSPITAL_BASED_OUTPATIENT_CLINIC_OR_DEPARTMENT_OTHER): Payer: Medicare Other

## 2021-01-30 ENCOUNTER — Encounter: Payer: Self-pay | Admitting: Oncology

## 2021-02-04 ENCOUNTER — Other Ambulatory Visit: Payer: Self-pay

## 2021-02-04 ENCOUNTER — Ambulatory Visit (INDEPENDENT_AMBULATORY_CARE_PROVIDER_SITE_OTHER): Payer: Medicare Other

## 2021-02-04 DIAGNOSIS — R9431 Abnormal electrocardiogram [ECG] [EKG]: Secondary | ICD-10-CM | POA: Diagnosis not present

## 2021-02-05 ENCOUNTER — Ambulatory Visit: Payer: Medicare Other | Admitting: Internal Medicine

## 2021-02-05 ENCOUNTER — Encounter: Payer: Self-pay | Admitting: Internal Medicine

## 2021-02-05 VITALS — BP 108/72 | HR 95 | Temp 97.8°F | Ht 64.0 in | Wt 136.4 lb

## 2021-02-05 DIAGNOSIS — Z87891 Personal history of nicotine dependence: Secondary | ICD-10-CM | POA: Diagnosis not present

## 2021-02-05 DIAGNOSIS — Z23 Encounter for immunization: Secondary | ICD-10-CM

## 2021-02-05 DIAGNOSIS — R0609 Other forms of dyspnea: Secondary | ICD-10-CM

## 2021-02-05 DIAGNOSIS — R053 Chronic cough: Secondary | ICD-10-CM

## 2021-02-05 DIAGNOSIS — R062 Wheezing: Secondary | ICD-10-CM | POA: Diagnosis not present

## 2021-02-05 LAB — ECHOCARDIOGRAM COMPLETE
Area-P 1/2: 3.77 cm2
Calc EF: 61.4 %
S' Lateral: 2.08 cm
Single Plane A2C EF: 65.9 %
Single Plane A4C EF: 55.8 %

## 2021-02-05 NOTE — Patient Instructions (Addendum)
ICD-10-CM   1. Chronic cough  R05.3     2. Wheezing  R06.2     3. DOE (dyspnea on exertion)  R06.09     4. History of smoking 10-25 pack years  Z87.891     5. Flu vaccine need  Z23       Need to rule out asthma/copd  Plan  - get echo repor from PCP Copland, Gay Filler, MD - not available right now  - check cbc with diff, Blood IgE  - do FeNO and full PFT  - do CT chest without contrast supine and prone - flu shot 02/05/2021   Followup  - return in next few weeks to see app but after completing tests
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
## 2021-02-07 ENCOUNTER — Encounter: Payer: Self-pay | Admitting: Family Medicine

## 2021-02-07 DIAGNOSIS — R9431 Abnormal electrocardiogram [ECG] [EKG]: Secondary | ICD-10-CM

## 2021-02-10 ENCOUNTER — Other Ambulatory Visit: Payer: Self-pay

## 2021-02-10 ENCOUNTER — Ambulatory Visit (INDEPENDENT_AMBULATORY_CARE_PROVIDER_SITE_OTHER)
Admission: RE | Admit: 2021-02-10 | Discharge: 2021-02-10 | Disposition: A | Discharge status: Home / Self Care | Payer: Medicare Other | Source: Ambulatory Visit | Attending: Internal Medicine | Admitting: Internal Medicine

## 2021-02-10 DIAGNOSIS — R053 Chronic cough: Secondary | ICD-10-CM | POA: Diagnosis not present

## 2021-02-10 DIAGNOSIS — J449 Chronic obstructive pulmonary disease, unspecified: Secondary | ICD-10-CM | POA: Diagnosis not present

## 2021-02-10 DIAGNOSIS — R0602 Shortness of breath: Secondary | ICD-10-CM | POA: Diagnosis not present

## 2021-02-12 ENCOUNTER — Other Ambulatory Visit (INDEPENDENT_AMBULATORY_CARE_PROVIDER_SITE_OTHER): Payer: Medicare Other

## 2021-02-12 DIAGNOSIS — R0609 Other forms of dyspnea: Secondary | ICD-10-CM

## 2021-02-12 DIAGNOSIS — R053 Chronic cough: Secondary | ICD-10-CM | POA: Diagnosis not present

## 2021-02-12 LAB — CBC WITH DIFFERENTIAL/PLATELET
Basophils Absolute: 0.1 10*3/uL (ref 0.0–0.1)
Basophils Relative: 0.6 % (ref 0.0–3.0)
Eosinophils Absolute: 0.2 10*3/uL (ref 0.0–0.7)
Eosinophils Relative: 1.6 % (ref 0.0–5.0)
HCT: 40.7 % (ref 36.0–46.0)
Hemoglobin: 14.1 g/dL (ref 12.0–15.0)
Lymphocytes Relative: 25.5 % (ref 12.0–46.0)
Lymphs Abs: 2.4 10*3/uL (ref 0.7–4.0)
MCHC: 34.6 g/dL (ref 30.0–36.0)
MCV: 93.5 fl (ref 78.0–100.0)
Monocytes Absolute: 0.4 10*3/uL (ref 0.1–1.0)
Monocytes Relative: 4.7 % (ref 3.0–12.0)
Neutro Abs: 6.3 10*3/uL (ref 1.4–7.7)
Neutrophils Relative %: 67.6 % (ref 43.0–77.0)
Platelets: 208 10*3/uL (ref 150.0–400.0)
RBC: 4.36 Mil/uL (ref 3.87–5.11)
RDW: 13 % (ref 11.5–15.5)
WBC: 9.4 10*3/uL (ref 4.0–10.5)

## 2021-02-15 LAB — IGE: IgE (Immunoglobulin E), Serum: 27 kU/L (ref <=114)

## 2021-02-15 NOTE — Telephone Encounter (Signed)
MR please advise. Thanks! 

## 2021-02-17 NOTE — Telephone Encounter (Signed)
Sorry forgot  - triage pls get her a sputum cup to give gram stain , culture, afb smear and culture

## 2021-02-21 NOTE — Progress Notes (Signed)
[place]  [phone] [phone]) [phone]     ID: Crystal [name]   DOB: 07-May-1975  MR#: [id number]  IPJ#:[id number]  Patient Care Team: Darreld Mclean, MD as PCP - General (Family Medicine) Wally Behan, Virgie Dad, MD as Consulting Physician (Oncology) Gaynelle Arabian, MD as Consulting Physician (Orthopedic Surgery) SU:  Lorine Bears, MD OTHER: Leigh Aurora, Rupinder [facility]   CHIEF COMPLAINT:  Hx of Right Breast Cancer (s/p bilateral mastectomies)  CURRENT THERAPY: Observation   INTERVAL HISTORY:  Crystal [name] returns today for follow-up of her right breast cancer. She continues under observation.   Of note since her last visit, she underwent chest CT on 02/10/2021 for evaluation of cough in the setting of a positive smoking history. This showed: no significant emphysema or bronchial wall thickening; very fine centrilobular nodularity, most concentrated in lung apices, most commonly seen in smoking-related respiratory bronchiolitis; incidental note of aberrant retroesophageal origin of right subclavian artery.   REVIEW OF SYSTEMS: Crystal [name] unfortunately is still smoking.   She is very concerned about her husband Crystal [name] who is followed by Dr. Marin Olp.  She also has been googling all the terms in her echo and CT of the chest and of course when people do that they become very alarmed.  A detailed review of systems was otherwise stable.  COVID 19 VACCINATION STATUS: Crystal [name] x2    BREAST CANCER HISTORY: From the initial intake note:  "The patient is a 45 year old [name] who noted a mass in her right breast and brought it to her primary care physician's attention June of 2012. She was set up for mammography at [facility], where an area of calcifications highly suspicious for carcinoma was biopsied. This was in the upper outer quadrant and it measured 1.7 cm mammographically and 1.8 cm by ultrasonography. The pathology report ([id number]) showed a high-grade invasive  ductal carcinoma estrogen receptor positive at 64% progesterone receptor positive at 18%, and HER-2 amplified with a ratio by CISH of 5.24. MIB-1-1 was 65%. A second mass biopsied at the same time was also triple positive.   Given her multiple masses in the right breast, mastectomy was recommended. The patient actually opted for bilateral mastectomies and this was performed together with a right axillary lymph node dissection under Dr. Star Age on 11/16/2010. The final pathology [id number]) showed, on the left, no evidence of cancer. On the right the patient had a high-grade invasive ductal carcinoma measuring 1.9 cm, with negative margins, grade 3, involving 2 of 16 lymph nodes (stage IIB)."  Her subsequent history is as detailed below.   PAST MEDICAL HISTORY: Past Medical History:  Diagnosis Date   Alcohol abuse    Anorexia nervosa    Anxiety    Avascular necrosis of bone of hip ([place])    Bipolar affective disorder, mixed ([place])    Cancer ([place])    Chronic pain    Depression    Dilated cardiomyopathy secondary to drug ([place])    DJD (degenerative joint disease)    "BONE STICKS OUT ON LEFT PART OF LOWER BACK"   DVT of upper extremity (deep vein thrombosis) ([place]) 2014   Edema of upper extremity    Family history of anesthesia complication    MOTHER PONV   Frequency of urination    GERD (gastroesophageal reflux disease)    OCCASIONALLY TAKE ZANTAC   History of breast cancer DX JUNE 2012 W/ RIGHT BREAST INVASIVE DUCTAL CA  STAGE IIB---  S/P BILATERAL MASTECTOMIES AND RIGHT NODE DISSECTION  ONCOLOGIST-- DR Jana Hakim--  CHEMO ENDED [place] 2012 / RADIATION ENDED MAR 2013--  CURRENT ON TAMOXIFEN AND ZOLADEX   History of idiopathic seizure    DX 2008 --  LAST ONE 2012--  NO ISSUES SINCE AND NO MEDS. -- PT STATES DOCTORS FELT IT WAS STRESS RELATED DUE TO HEALTH ISSUES   Hot flashes due to tamoxifen    Hx MRSA infection 03/28/11   Skin   Left ovarian cyst    Migraines    Neuropathy     Nocturia    Right lower quadrant abdominal pain    Seizures ([place])    2016 last seizure    Status post chemotherapy    docetaxel/carboplatin/trastuzumab   Strains to urinate    Urgency of urination    History of seizures. The patient has been thoroughly evaluated both here by Dr. Gaynell Face and at [facility] for this. Among many studies, she had an MRI of the brain on August 28th. This showed an incidental solitary small lesion in the left frontoparietal white matter. It was not felt to be suggestive of multiple sclerosis. A noncontrasted CT in August 2010 was negative. The patient has been off all seizure medications now for 2 months. There has been no evidence of seizure recurrence. Complex psychology history (please refer to Dr. Iona Coach note in the E-chart from August 29, 2009) with evidence of anxiety disorder, depression and psychosis.  History of migraines. Remote history of multidrug abuse. Status post wisdom teeth extraction.         History of MRSA skin infection   PAST SURGICAL HISTORY: Past Surgical History:  Procedure Laterality Date   BIOPSY BREAST  10/14/2010   right needle core biopsy   BREAST SURGERY  11/16/2010   bilateral mastectomy+ right axillary node dissection,T1cN1a, Her2+,ERPR+  (right breast invasive ductal carcinoma)   CYSTO WITH HYDRODISTENSION N/A 07/23/2012   Procedure: CYSTOSCOPY/HYDRODISTENSION instillation of marcaine and pyridium;  Surgeon: Reece Packer, MD;  Location: [place];  Service: Urology;  Laterality: N/A;  INSTILLATION     HARDWARE REMOVAL Right 12/03/2018   Procedure: Right tibia screw removal;  Surgeon: Gaynelle Arabian, MD;  Location: [facility];  Service: Orthopedics;  Laterality: Right;  4mn   I & D EXTREMITY  09/26/2011   Procedure: IRRIGATION AND DEBRIDEMENT EXTREMITY;  Surgeon: KTennis Must MD;  Location: [facility];  Service: Orthopedics;  Laterality: Right;  I&D right hand cat bite wound   ORIF TIBIA PLATEAU Right 11/27/2017    Procedure: OPEN REDUCTION INTERNAL FIXATION (ORIF) TIBIAL PLATEAU FRACTURE;  Surgeon: AGaynelle Arabian MD;  Location: [facility];  Service: Orthopedics;  Laterality: Right;   PORTACATH PLACEMENT  11/16/2010   placement of left subclavian port NOW REMOVED   right knee torn meniscus surgery      TOTAL HIP ARTHROPLASTY Left 06/13/2018   Procedure: TOTAL HIP ARTHROPLASTY ANTERIOR APPROACH;  Surgeon: AGaynelle Arabian MD;  Location: [facility];  Service: Orthopedics;  Laterality: Left;  1058m   TRANSTHORACIC ECHOCARDIOGRAM  05-28-2012   LVF NORMAL/  EF 55-60%   WISDOM TOOTH EXTRACTION      FAMILY HISTORY: Family History  Problem Relation Age of Onset   Cancer Maternal Grandfather    Mental illness Brother    Mental illness Son   The patient's mother is living, in her mid 505swith no history of breast or ovarian cancer. The patient does not know her biological father. The patient has an older brother with bipolar disease, and according to the  patient, borderline schizophrenia.   GYNECOLOGIC HISTORY: (Updated January 2015) Menarche at age 71. She is G1, P1 with first pregnancy to term at age 7. She was not menopausal, and was still having regular periods at the time of her breast cancer diagnosis. She continues on every three-month goserelin.   SOCIAL HISTORY:  (Updated 03/2020) She used to work in a [employer] but had to leave that job because of the seizure problem. She's currently unemployed. Her husband, Crystal [name], works as a Development worker, international aid. Jessamy has a son, Crystal [name], who is 72 as of 03/2020 and lives in [place] the patient's parents.  He works for a Administrator, sports.  The patient attends a local [employer].     ADVANCED DIRECTIVES: In the absence of any documentation to the contrary, the patient's spouse is their HCPOA.    HEALTH MAINTENANCE:  Social History   Tobacco Use   Smoking status: Every Day    Packs/day: 1.00    Years: 20.00    Pack years: 20.00    Types: Cigarettes    Smokeless tobacco: Never  Vaping Use   Vaping Use: Never used  Substance Use Topics   Alcohol use: Yes    Comment: 3 times a week   Drug use: No    Comment: LAST USED COCAINE AND MARIJIUANA 2011     Colonoscopy: Never  PAP: July 2012  Bone density:  Never  Lipid panel: Not on file  Allergies  Allergen Reactions   Tramadol Other (See Comments)    Seizures   Phenytoin Nausea And Vomiting   Thorazine [Chlorpromazine Hcl] Hives   Ciprofloxacin Nausea And Vomiting   Vancomycin Rash and Other (See Comments)    Possible rash reported by MD    Current Outpatient Medications  Medication Sig Dispense Refill   albuterol (VENTOLIN HFA) 108 (90 Base) MCG/ACT inhaler Inhale 2 puffs into the lungs every 4 (four) hours as needed for wheezing or shortness of breath. 18 g 2   ALPRAZolam (XANAX) 1 MG tablet Take 1 mg by mouth 4 (four) times daily.     amphetamine-dextroamphetamine (ADDERALL XR) 30 MG 24 hr capsule Take by mouth every morning.     amphetamine-dextroamphetamine (ADDERALL) 10 MG tablet Take 10 mg by mouth daily as needed.     Eszopiclone 3 MG TABS Take 3 mg by mouth at bedtime. Take immediately before bedtime     gabapentin (NEURONTIN) 600 MG tablet Take 600 mg by mouth. 3 in morning, two tablets at night     ipratropium-albuterol (DUONEB) 0.5-2.5 (3) MG/3ML SOLN Take 3 mLs by nebulization every 6 (six) hours as needed. 360 mL 1   OLANZapine-Samidorphan (LYBALVI) 5-10 MG TABS Take by mouth at bedtime.     traZODone (DESYREL) 50 MG tablet Take 50 mg by mouth at bedtime.     No current facility-administered medications for this visit.    OBJECTIVE: white woman who appears younger than stated age  1:   02/22/21 1406  BP: 120/90  Pulse: (!) 112  Resp: 18  Temp: 98.1 F (36.7 C)  SpO2: 99%   ECOG: 2 Body mass index is 22.8 kg/m. Filed Weights   02/22/21 1406  Weight: 132 lb 12.8 oz (60.2 kg)    Sclerae unicteric, EOMs intact Wearing a mask No cervical or  supraclavicular adenopathy Lungs no rales or rhonchi Heart regular rate and rhythm Abd soft, nontender, positive bowel sounds MSK no focal spinal tenderness, no upper extremity lymphedema Neuro: nonfocal, well oriented, pleasant, slightly scattered  affect Breasts: Status post bilateral mastectomies with no evidence of chest wall recurrence.  Both axillae are benign   LAB RESULTS: Lab Results  Component Value Date   WBC 7.9 02/22/2021   NEUTROABS 5.7 02/22/2021   HGB 15.9 (H) 02/22/2021   HCT 43.0 02/22/2021   MCV 88.5 02/22/2021   PLT 260 02/22/2021      Chemistry      Component Value Date/Time   NA 140 08/24/2020 1006   NA 140 01/03/2017 1420   K 4.2 08/24/2020 1006   K 3.5 01/03/2017 1420   CL 106 08/24/2020 1006   CL 105 10/11/2012 1441   CO2 20 08/24/2020 1006   CO2 25 01/03/2017 1420   BUN 13 08/24/2020 1006   BUN 15.2 01/03/2017 1420   CREATININE 0.82 08/24/2020 1006   CREATININE 0.9 01/03/2017 1420      Component Value Date/Time   CALCIUM 9.2 08/24/2020 1006   CALCIUM 9.7 01/03/2017 1420   ALKPHOS 60 03/24/2020 1241   ALKPHOS 115 01/03/2017 1420   AST 26 03/24/2020 1241   AST 31 01/03/2017 1420   ALT 25 03/24/2020 1241   ALT 27 01/03/2017 1420   BILITOT 0.4 03/24/2020 1241   BILITOT 0.41 01/03/2017 1420      STUDIES: CT Chest Wo Contrast  Result Date: 02/12/2021 CLINICAL DATA:  Cough, shortness of breath, wheezing, smoker, evaluate for COPD, history of right breast surgery, history of right breast cancer EXAM: CT CHEST WITHOUT CONTRAST TECHNIQUE: Multidetector CT imaging of the chest was performed following the standard protocol without IV contrast. COMPARISON:  None. FINDINGS: Cardiovascular: Incidental note of aberrant retroesophageal origin of the right subclavian are normal heart size. No pericardial effusion. Mediastinum/Nodes: No enlarged mediastinal, hilar, or axillary lymph nodes. Thyroid gland, trachea, and esophagus demonstrate no significant  findings. Lungs/Pleura: Minimal subpleural radiation fibrosis of the anterior right upper lobe. Very fine centrilobular nodularity, most concentrated in the lung apices. No pleural effusion or pneumothorax. Upper Abdomen: No acute abnormality. Musculoskeletal: No chest wall mass or suspicious bone lesions identified. Status post bilateral mastectomy. IMPRESSION: 1. Minimal subpleural radiation fibrosis of the anterior right upper lobe, in keeping with history of breast cancer 2. No significant emphysema or bronchial wall thickening. 3. Very fine centrilobular nodularity, most concentrated in the lung apices, most commonly seen in smoking-related respiratory bronchiolitis. 4. Status post bilateral mastectomy. 5. 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 Electronically Signed   By: Delanna Ahmadi M.D.   On: 02/12/2021 09:07   ECHOCARDIOGRAM COMPLETE  Result Date: 02/05/2021    ECHOCARDIOGRAM REPORT   Patient Name:   Crystal [name] Date of Exam: 02/04/2021 Medical Rec #:  [id number]       Height:       66.0 in Accession #:    [id number]      Weight:       137.0 lb Date of Birth:  12-01-1975       BSA:          1.703 m Patient Age:    66 years        BP:           116/90 mmHg Patient Gender: F               HR:           92 bpm. Exam Location:  Outpatient Procedure: 2D Echo, Color Doppler and Cardiac Doppler Indications:    R94.31 (ICD-10-CM) - Abnormal EKG  History:        Patient has prior history of Echocardiogram examinations, most                 recent 08/20/2012. Risk Factors:Current Smoker. MRSA, MDD, Breast                 cancer, ETOH.  Sonographer:    Leavy Cella RDCS Referring Phys: [place]  1. Left ventricular ejection fraction, by estimation, is 60 to 65%. The left ventricle has normal function. The left ventricle has no regional wall motion abnormalities. Left ventricular diastolic parameters were normal. The average left ventricular  global longitudinal strain is -12.9 %. The global longitudinal strain is abnormal.  2. Right ventricular systolic function is normal. The right ventricular size is normal.  3. The mitral valve is normal in structure. Trivial mitral valve regurgitation. No evidence of mitral stenosis.  4. The aortic valve is normal in structure. Aortic valve regurgitation is not visualized. No aortic stenosis is present.  5. The inferior vena cava is normal in size with greater than 50% respiratory variability, suggesting right atrial pressure of 3 mmHg. Comparison(s): No significant change from prior study. FINDINGS  Left Ventricle: Left ventricular ejection fraction, by estimation, is 60 to 65%. The left ventricle has normal function. The left ventricle has no regional wall motion abnormalities. The average left ventricular global longitudinal strain is -12.9 %. The global longitudinal strain is abnormal. The left ventricular internal cavity size was normal in size. There is no left ventricular hypertrophy. Left ventricular diastolic parameters were normal. Right Ventricle: The right ventricular size is normal. No increase in right ventricular wall thickness. Right ventricular systolic function is normal. Left Atrium: Left atrial size was normal in size. Right Atrium: Right atrial size was normal in size. Pericardium: There is no evidence of pericardial effusion. Mitral Valve: The mitral valve is normal in structure. Trivial mitral valve regurgitation. No evidence of mitral valve stenosis. Tricuspid Valve: The tricuspid valve is normal in structure. Tricuspid valve regurgitation is trivial. No evidence of tricuspid stenosis. Aortic Valve: The aortic valve is normal in structure. Aortic valve regurgitation is not visualized. No aortic stenosis is present. Pulmonic Valve: The pulmonic valve was normal in structure. Pulmonic valve regurgitation is not visualized. No evidence of pulmonic stenosis. Aorta: The aortic root is normal in  size and structure. Venous: The inferior vena cava is normal in size with greater than 50% respiratory variability, suggesting right atrial pressure of 3 mmHg. IAS/Shunts: No atrial level shunt detected by color flow Doppler.  LEFT VENTRICLE PLAX 2D LVIDd:         3.60 cm     Diastology LVIDs:         2.08 cm     LV e' medial:    11.10 cm/s LV PW:         0.96 cm     LV E/e' medial:  7.5 LV IVS:        0.77 cm     LV e' lateral:   12.20 cm/s LVOT diam:     2.00 cm     LV E/e' lateral: 6.8 LVOT Area:     3.14 cm                            2D Longitudinal Strain  2D Strain GLS Avg:     -12.9 % LV Volumes (MOD) LV vol d, MOD A2C: 64.5 ml LV vol d, MOD A4C: 67.0 ml LV vol s, MOD A2C: 22.0 ml LV vol s, MOD A4C: 29.6 ml LV SV MOD A2C:     42.5 ml LV SV MOD A4C:     67.0 ml LV SV MOD BP:      41.8 ml RIGHT VENTRICLE RV Basal diam:  2.74 cm RV Mid diam:    2.21 cm RV S prime:     13.80 cm/s TAPSE (M-mode): 2.2 cm LEFT ATRIUM             Index        RIGHT ATRIUM          Index LA diam:        2.30 cm 1.35 cm/m   RA Area:     7.44 cm LA Vol (A2C):   23.9 ml 14.04 ml/m  RA Volume:   13.30 ml 7.81 ml/m LA Vol (A4C):   23.3 ml 13.68 ml/m LA Biplane Vol: 24.1 ml 14.15 ml/m   AORTA Ao Root diam: 3.10 cm Ao Asc diam:  2.80 cm MITRAL VALVE MV Area (PHT): 3.77 cm    SHUNTS MV Decel Time: 201 msec    Systemic Diam: 2.00 cm MV E velocity: 82.80 cm/s MV A velocity: 68.70 cm/s MV E/A ratio:  1.21 Kardie Tobb DO Electronically signed by Berniece Salines DO Signature Date/Time: 02/05/2021/3:59:56 PM    Final      ASSESSMENT: 45 y.o.  [place] woman, known to be BRCA1 and 2 negative,   (1) status post bilateral mastectomies May of 2012 for a right-sided T1c N1 (stage II) grade 3 invasive ductal carcinoma,estrogen and progesterone receptor positive, HER-2 amplified, with an MIB-1 of 85%.   (2) status post carboplatin/ docetaxol/ trastuzumab x6, completed mid December 2012   (3) on trastuzumab every 3  weeks, started August of 2012, interrupted between April and August of 2013, completed 04/27/2012.   (4) status post radiation therapy, completed 07/12/2011  (5) On tamoxifen starting April 2013, held as of 12/27/2012 with the diagnosis of a left upper extremity DVT.  On therapeutic Coumadin until  May 2015 when port removed and coumadin discontinued  (6) On goserelin given every 3 months, 1st injection on 10/26/11, discontinued June 2018 at patient's discretion   (8) started letrozole August 2015, discontinued March 2018 at patient's discretionmonths, 1st injection on 10/26/11, discontinued June 2018 at patient's discretion  (7) started on anastrozole November 2014, discontinued January 2015 because of side effects  PLAN:  Azalie is now 10-1/2 years out from definitive surgery for her breast cancer with no evidence of disease recurrence.  This is very favorable.  She is understandably concerned about her husband Crystal [name]'s situation.  He does have an excellent physician and Dr. Marin Olp and she should address her concerns during Crystal [name]'s visit with his physician.  I did reassure her that many patients with the problem with Crystal [name] has do very well for a indeterminate number of years.  Again strongly urged her to discontinue smoking.  At this point I feel comfortable releasing her from follow-up here.  However she tells me she derive some reassurance from being seen and evaluated so she will return to see [place] again in a year from now.  She knows to call for any issue that may develop before then.  Total encounter time 25 minutes.*.   , Virgie Dad, MD  02/22/21 2:10 PM Medical Oncology and Hematology [facility] [place], [place] [address] Tel. [phone]    Fax. [phone]    I, Wilburn Mylar, am acting as scribe for Dr. Virgie Dad. Crystal [name].  I, Lurline Del MD, have reviewed the above documentation for accuracy and completeness, and I agree with the  above.   *Total Encounter Time as defined by the Centers for Medicare and Medicaid Services includes, in addition to the face-to-face time of a patient visit (documented in the note above) non-face-to-face time: obtaining and reviewing outside history, ordering and reviewing medications, tests or procedures, care coordination (communications with other health care professionals or caregivers) and documentation in the medical record.

## 2021-02-22 ENCOUNTER — Inpatient Hospital Stay: Payer: Medicare Other

## 2021-02-22 ENCOUNTER — Other Ambulatory Visit: Payer: Self-pay

## 2021-02-22 ENCOUNTER — Inpatient Hospital Stay: Payer: Medicare Other | Attending: Oncology | Admitting: Oncology

## 2021-02-22 VITALS — BP 120/90 | HR 112 | Temp 98.1°F | Resp 18 | Ht 64.0 in | Wt 132.8 lb

## 2021-02-22 DIAGNOSIS — Z923 Personal history of irradiation: Secondary | ICD-10-CM | POA: Diagnosis not present

## 2021-02-22 DIAGNOSIS — F17210 Nicotine dependence, cigarettes, uncomplicated: Secondary | ICD-10-CM | POA: Insufficient documentation

## 2021-02-22 DIAGNOSIS — Z9221 Personal history of antineoplastic chemotherapy: Secondary | ICD-10-CM | POA: Diagnosis not present

## 2021-02-22 DIAGNOSIS — Z853 Personal history of malignant neoplasm of breast: Secondary | ICD-10-CM | POA: Insufficient documentation

## 2021-02-22 DIAGNOSIS — C50411 Malignant neoplasm of upper-outer quadrant of right female breast: Secondary | ICD-10-CM

## 2021-02-22 DIAGNOSIS — Z86718 Personal history of other venous thrombosis and embolism: Secondary | ICD-10-CM | POA: Diagnosis not present

## 2021-02-22 DIAGNOSIS — Z9013 Acquired absence of bilateral breasts and nipples: Secondary | ICD-10-CM | POA: Diagnosis not present

## 2021-02-22 DIAGNOSIS — Z170 Estrogen receptor positive status [ER+]: Secondary | ICD-10-CM | POA: Diagnosis not present

## 2021-02-22 LAB — CBC WITH DIFFERENTIAL/PLATELET
Abs Immature Granulocytes: 0.02 10*3/uL (ref 0.00–0.07)
Basophils Absolute: 0 10*3/uL (ref 0.0–0.1)
Basophils Relative: 1 %
Eosinophils Absolute: 0 10*3/uL (ref 0.0–0.5)
Eosinophils Relative: 1 %
HCT: 43 % (ref 36.0–46.0)
Hemoglobin: 15.9 g/dL — ABNORMAL HIGH (ref 12.0–15.0)
Immature Granulocytes: 0 %
Lymphocytes Relative: 21 %
Lymphs Abs: 1.7 10*3/uL (ref 0.7–4.0)
MCH: 32.7 pg (ref 26.0–34.0)
MCHC: 37 g/dL — ABNORMAL HIGH (ref 30.0–36.0)
MCV: 88.5 fL (ref 80.0–100.0)
Monocytes Absolute: 0.4 10*3/uL (ref 0.1–1.0)
Monocytes Relative: 5 %
Neutro Abs: 5.7 10*3/uL (ref 1.7–7.7)
Neutrophils Relative %: 72 %
Platelets: 260 10*3/uL (ref 150–400)
RBC: 4.86 MIL/uL (ref 3.87–5.11)
RDW: 12.3 % (ref 11.5–15.5)
WBC: 7.9 10*3/uL (ref 4.0–10.5)
nRBC: 0 % (ref 0.0–0.2)

## 2021-02-22 LAB — CMP (CANCER CENTER ONLY)
ALT: 23 U/L (ref 0–44)
AST: 28 U/L (ref 15–41)
Albumin: 4.5 g/dL (ref 3.5–5.0)
Alkaline Phosphatase: 69 U/L (ref 38–126)
Anion gap: 10 (ref 5–15)
BUN: 11 mg/dL (ref 6–20)
CO2: 23 mmol/L (ref 22–32)
Calcium: 9.3 mg/dL (ref 8.9–10.3)
Chloride: 104 mmol/L (ref 98–111)
Creatinine: 0.88 mg/dL (ref 0.44–1.00)
GFR, Estimated: >60 mL/min (ref >60)
Glucose, Bld: 84 mg/dL (ref 70–99)
Potassium: 3.3 mmol/L — ABNORMAL LOW (ref 3.5–5.1)
Sodium: 137 mmol/L (ref 135–145)
Total Bilirubin: 0.5 mg/dL (ref 0.3–1.2)
Total Protein: 7.5 g/dL (ref 6.5–8.1)

## 2021-02-23 LAB — THYROID PANEL WITH TSH
Free Thyroxine Index: 2.2 (ref 1.2–4.9)
T3 Uptake Ratio: 26 % (ref 24–39)
T4, Total: 8.3 ug/dL (ref 4.5–12.0)
TSH: 1.06 u[IU]/mL (ref 0.450–4.500)

## 2021-02-25 ENCOUNTER — Telehealth: Payer: Self-pay | Admitting: Family Medicine

## 2021-02-25 NOTE — Telephone Encounter (Signed)
Left message for patient to call back and schedule Medicare Annual Wellness Visit (AWV) in office.  ° °If not able to come in office, please offer to do virtually or by telephone.  Left office number and my jabber #[phone]. ° °Due for AWVI ° °Please schedule at anytime with Nurse Health Advisor. °  °

## 2021-03-15 ENCOUNTER — Other Ambulatory Visit: Payer: Medicare Other

## 2021-03-15 DIAGNOSIS — R053 Chronic cough: Secondary | ICD-10-CM

## 2021-03-19 LAB — RESPIRATORY CULTURE OR RESPIRATORY AND SPUTUM CULTURE
MICRO NUMBER:: 12633408
SPECIMEN QUALITY:: ADEQUATE

## 2021-03-27 ENCOUNTER — Encounter: Payer: Self-pay | Admitting: Internal Medicine

## 2021-03-28 NOTE — Telephone Encounter (Signed)
Antibotics for what complaint? What symptoms? When did she take doxy and for how long? When did she take z pak and for how long/?  Regarding results  - blood eois/igE - normal  - CT with mild radiation and smoking changes  Plan  - she needs to quit smoking - need answers to above questions - needs to finish workup as of 02/05/21 visit and see APP for followuop   No flowsheet data found.    IMPRESSION: 1. Minimal subpleural radiation fibrosis of the anterior right upper lobe, in keeping with history of breast cancer 2. No significant emphysema or bronchial wall thickening. 3. Very fine centrilobular nodularity, most concentrated in the lung apices, most commonly seen in smoking-related respiratory bronchiolitis. 4. Status post bilateral mastectomy. 5. 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     Electronically Signed   By: Delanna Ahmadi M.D.   On: 02/12/2021 09:07

## 2021-03-28 NOTE — Telephone Encounter (Signed)
MR please advise. Thanks! 

## 2021-03-29 NOTE — Telephone Encounter (Signed)
Called the pt and there was no answer- LMTCB    

## 2021-04-01 ENCOUNTER — Other Ambulatory Visit: Payer: Self-pay

## 2021-04-01 ENCOUNTER — Ambulatory Visit (INDEPENDENT_AMBULATORY_CARE_PROVIDER_SITE_OTHER): Payer: Medicare Other | Admitting: Internal Medicine

## 2021-04-01 ENCOUNTER — Ambulatory Visit: Payer: Medicare Other | Admitting: Internal Medicine

## 2021-04-01 ENCOUNTER — Encounter: Payer: Self-pay | Admitting: Internal Medicine

## 2021-04-01 VITALS — BP 118/68 | HR 105 | Temp 98.5°F | Ht 65.0 in | Wt 142.0 lb

## 2021-04-01 DIAGNOSIS — J42 Unspecified chronic bronchitis: Secondary | ICD-10-CM

## 2021-04-01 DIAGNOSIS — R053 Chronic cough: Secondary | ICD-10-CM

## 2021-04-01 DIAGNOSIS — Z87891 Personal history of nicotine dependence: Secondary | ICD-10-CM

## 2021-04-01 DIAGNOSIS — A498 Other bacterial infections of unspecified site: Secondary | ICD-10-CM

## 2021-04-01 DIAGNOSIS — R0609 Other forms of dyspnea: Secondary | ICD-10-CM

## 2021-04-01 DIAGNOSIS — R0989 Other specified symptoms and signs involving the circulatory and respiratory systems: Secondary | ICD-10-CM

## 2021-04-01 LAB — PULMONARY FUNCTION TEST
DL/VA % pred: 91 %
DL/VA: 4.01 ml/min/mmHg/L
DLCO cor % pred: 85 %
DLCO cor: 19.21 ml/min/mmHg
DLCO unc % pred: 91 %
DLCO unc: 20.54 ml/min/mmHg
FEF 25-75 Post: 2.91 L/sec
FEF 25-75 Pre: 2.48 L/sec
FEF2575-%Change-Post: 17 %
FEF2575-%Pred-Post: 94 %
FEF2575-%Pred-Pre: 80 %
FEV1-%Change-Post: 5 %
FEV1-%Pred-Post: 97 %
FEV1-%Pred-Pre: 93 %
FEV1-Post: 2.99 L
FEV1-Pre: 2.84 L
FEV1FVC-%Change-Post: 5 %
FEV1FVC-%Pred-Pre: 95 %
FEV6-%Change-Post: 0 %
FEV6-%Pred-Post: 97 %
FEV6-%Pred-Pre: 98 %
FEV6-Post: 3.63 L
FEV6-Pre: 3.66 L
FEV6FVC-%Pred-Post: 102 %
FEV6FVC-%Pred-Pre: 102 %
FVC-%Change-Post: 0 %
FVC-%Pred-Post: 96 %
FVC-%Pred-Pre: 96 %
FVC-Post: 3.65 L
FVC-Pre: 3.66 L
Post FEV1/FVC ratio: 82 %
Post FEV6/FVC ratio: 100 %
Pre FEV1/FVC ratio: 78 %
Pre FEV6/FVC Ratio: 100 %
RV % pred: 117 %
RV: 1.99 L
TLC % pred: 104 %
TLC: 5.46 L

## 2021-04-01 LAB — NITRIC OXIDE: Nitric Oxide: 9

## 2021-04-01 MED ORDER — CIPROFLOXACIN HCL 750 MG PO TABS: 750.0000 mg | ORAL_TABLET | Freq: Two times a day (BID) | ORAL | 0 refills | Status: DC

## 2021-04-01 NOTE — Progress Notes (Signed)
PFT done today. 

## 2021-04-01 NOTE — Progress Notes (Signed)
OV 02/05/2021  Subjective:  Patient ID: Crystal [name], female , DOB: 12-Jan-1976 , age 45 y.o. , MRN: [id number] , ADDRESS: [place] [address] PCP Crystal [name] Patient Care Team: Crystal [name] as PCP - General (Family Medicine) Crystal [name] as Consulting Physician (Oncology) Crystal [name] as Consulting Physician (Orthopedic Surgery)  This Provider for this visit: Treatment Team:  Attending Provider: Brand Males, [name]    02/05/2021 -   Chief Complaint  Patient presents with   Consult    Pt. Had echocardiogram done and wants to follow up on results. Pt. Says she also wants to talk to the doctor about her lungs and getting a chest xray done. Pt. Says she's been coughing up black and green stuff.     HPI Crystal [name] 45 y.o. -new consult.  She has significant medical problems that include history of at least 20 pack smoking history.  History of previous suicide attempt by slashing her neck approximately 1 year ago and admission for that.  She tells me that approximately since then she has had insidious onset of chronic cough every day or so.  It is moderate to severe in intensity.  Symptom score is as below.  Along with the cough she brings up gray or black or green sputum.  She feels as an infection.  She feels this may be because of her smoking.  She is worried she might have COPD.  There are some associated intermittent wheezing.  She also has dyspnea on exertion for walking to the trash can and back.  Her husband is 26 and has significant COPD apparently.  There is no family history of asthma.  There is no family history of personal history of allergies.  She states that she is unable to quit smoking because of her mental health issues.  She denies any substance abuse otherwise.  Lab work so far as described below.  She had an echocardiogram yesterday but the results are not ready yet.  [Initially she thought she was here to  review echo results].    Dr Crystal [name] Reflux Symptom Index (> 13-15 suggestive of LPR cough) 0 -> 5  =  none ->severe problem 02/05/2021   Hoarseness of problem with voice 4  Clearing  Of Throat 5  Excess throat mucus or feeling of post nasal drip 4  Difficulty swallowing food, liquid or tablets 5  Cough after eating or lying down 5  Breathing difficulties or choking episodes 4  Troublesome or annoying cough 5  Sensation of something sticking in throat or lump in throat 5  Heartburn, chest pain, indigestion, or stomach acid coming up 3  TOTAL 40     Results for Crystal [name] (MRN [id number]) as of 02/05/2021 10:30  Ref. Range 01/09/2013 12:28 07/07/2017 10:53  Anti Nuclear Antibody (ANA) Latest Ref Range: NEGATIVE  NEG   ANA Ab, IFA Unknown  Negative  RA Latex Turbid. Latest Ref Range: <=14 IU/mL <10     Results for Crystal [name] (MRN [id number]) as of 02/05/2021 10:30  Ref. Range 08/21/2019 11:17  Amphetamines Latest Ref Range: NONE DETECTED  NONE DETECTED  Barbiturates Latest Ref Range: NONE DETECTED  NONE DETECTED  Benzodiazepines Latest Ref Range: NONE DETECTED  POSITIVE (A)  Opiates Latest Ref Range: NONE DETECTED  NONE DETECTED  COCAINE Latest Ref Range: NONE DETECTED  NONE DETECTED  Tetrahydrocannabinol Latest Ref Range: NONE DETECTED  NONE  DETECTED   Narrative & Impression  CLINICAL DATA:  Coughing up mucus.   EXAM: CHEST - 2 VIEW   COMPARISON:  05/29/2018.   FINDINGS: The heart size and mediastinal contours are within normal limits. Both lungs are clear. No visible pleural effusions or pneumothorax. Right apical pleuroparenchymal scarring, similar over multiple priors. No acute osseous abnormality. Bilateral mastectomies. Clips project in the lateral right chest/axillary region.   IMPRESSION: No active cardiopulmonary disease.     Electronically Signed   By: Margaretha Sheffield [name]   On: 08/24/2020 10:39     Results for Crystal [name] (MRN [id number]) as  of 02/05/2021 10:30  Ref. Range 10/28/2018 16:00 08/25/2019 06:44 03/24/2020 12:41  Eosinophil Latest Units: % '1 2 2    ' OV 04/01/2021  Subjective:  Patient ID: Crystal [name], female , DOB: November 08, 1975 , age 57 y.o. , MRN: [id number] , ADDRESS: [place] [address] PCP Crystal [name] Patient Care Team: Crystal [name] as PCP - General (Family Medicine) Crystal [name] as Consulting Physician (Oncology) Crystal [name] as Consulting Physician (Orthopedic Surgery) Crystal [name] as Consulting Physician (Pulmonary Disease)  This Provider for this visit: Treatment Team:  Attending Provider: Brand Males, [name]    04/01/2021 -   Chief Complaint  Patient presents with   Follow-up    PFT and feno performed today.  Pt states she is still coughing up phlegm that is green-black in color. States that when she eats and takes meds that she feels like something is getting stuck in her throat.     HPI Crystal [name] 45 y.o. - Returns for follow-up to discuss her test results.  This was done for chronic cough.  In the interim one of the oncologist ordered a sputum culture on her on 03/16/2019 was growing Pseudomonas.  The Pseudomonas is pansensitive.  She is worried about how serious of an infection this is.  She had a CT scan of the chest that essentially looks fairly normal except for some fine centrilobular nodularity in the lung apices and minimal subpleural radiation fibrosis in the anterior right upper lobe.  There is very minimal no COPD or cancer fibrosis ILD.  No emphysema.  No pneumonia.  Pulmonary function test looks normal to me.  Her blood IgE and eosinophilia are normal.  She complains of significant cough.  Also clearing of the throat RSI cough score is 41 significantly elevated suggestive of cough neuropathy.  She is actually clearing the throat even in the office.     Dr Crystal [name] Reflux Symptom Index (> 13-15 suggestive of LPR cough) 0 -> 5   =  none ->severe problem 04/01/2021   Hoarseness of problem with voice 3  Clearing  Of Throat 5  Excess throat mucus or feeling of post nasal drip 5  Difficulty swallowing food, liquid or tablets 4  Cough after eating or lying down 5  Breathing difficulties or choking episodes 4  Troublesome or annoying cough 5  Sensation of something sticking in throat or lump in throat 5  Heartburn, chest pain, indigestion, or stomach acid coming up 4  TOTAL 41       CT Chest data 02/10/21 IMPRESSION: 1. Minimal subpleural radiation fibrosis of the anterior right upper lobe, in keeping with history of breast cancer 2. No significant emphysema or bronchial wall thickening. 3. Very fine centrilobular nodularity, most concentrated in the lung apices, most commonly seen in smoking-related respiratory bronchiolitis. 4. Status  post bilateral mastectomy. 5. 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     Electronically Signed   By: Delanna Ahmadi M.D.   On: 02/12/2021 09:07  No results found.  Lab Results  Component Value Date   NITRICOXIDE 9 04/01/2021    [id number]  GRAM STAIN: 03/15/21 Few White blood cells seen Rare epithelial cells Moderate Gram positive cocci in pairs Abnormal    ISOLATE 1: Pseudomonas aeruginosa Abnormal    Comment: Moderate growth of Pseudomonas aeruginosa  Resulting Agency [facility]     Susceptibility   Pseudomonas aeruginosa    RESPIRATORY/SPUTUM CULT NEGATIVE 1    CEFEPIME <=1  Sensitive    CEFTAZIDIME 4  Sensitive    CIPROFLOXACIN <=0.25  Sensitive    GENTAMICIN <=1  Sensitive    IMIPENEM <=0.25  Sensitive    LEVOFLOXACIN 1  Sensitive    PIP/TAZO 8  Sensitive    TOBRAMYCIN <=1  Sensitive 1           PFT  PFT Results Latest Ref Rng & Units 04/01/2021  FVC-Pre L 3.66  FVC-Predicted Pre % 96  FVC-Post L 3.65  FVC-Predicted Post % 96  Pre FEV1/FVC % % 78  Post  FEV1/FCV % % 82  FEV1-Pre L 2.84  FEV1-Predicted Pre % 93  FEV1-Post L 2.99  DLCO uncorrected ml/min/mmHg 20.54  DLCO UNC% % 91  DLCO corrected ml/min/mmHg 19.21  DLCO COR %Predicted % 85  DLVA Predicted % 91  TLC L 5.46  TLC % Predicted % 104  RV % Predicted % 117    Latest Reference Range & Units 02/12/21 14:04 02/22/21 13:43  Eosinophils Absolute 0.0 - 0.5 K/uL 0.2 0.0    Latest Reference Range & Units 02/12/21 14:04  IgE (Immunoglobulin E), Serum <OR=114 kU/L 27     has a past medical history of Alcohol abuse, Anorexia nervosa, Anxiety, Avascular necrosis of bone of hip ([place]), Bipolar affective disorder, mixed ([place]), Cancer ([place]), Chronic pain, Depression, Dilated cardiomyopathy secondary to drug ([place]), DJD (degenerative joint disease), DVT of upper extremity (deep vein thrombosis) ([place]) (2014), Edema of upper extremity, Family history of anesthesia complication, Frequency of urination, GERD (gastroesophageal reflux disease), History of breast cancer (DX JUNE 2012 W/ RIGHT BREAST INVASIVE DUCTAL CA  STAGE IIB---  S/P BILATERAL MASTECTOMIES AND RIGHT NODE DISSECTION), History of idiopathic seizure, Hot flashes due to tamoxifen, MRSA infection (03/28/11), Left ovarian cyst, Migraines, Neuropathy, Nocturia, Right lower quadrant abdominal pain, Seizures ([place]), Status post chemotherapy, Strains to urinate, and Urgency of urination.   reports that she has been smoking cigarettes. She has a 20.00 pack-year smoking history. She has never used smokeless tobacco.  Past Surgical History:  Procedure Laterality Date   BIOPSY BREAST  10/14/2010   right needle core biopsy   BREAST SURGERY  11/16/2010   bilateral mastectomy+ right axillary node dissection,T1cN1a, Her2+,ERPR+  (right breast invasive ductal carcinoma)   CYSTO WITH HYDRODISTENSION N/A 07/23/2012   Procedure: CYSTOSCOPY/HYDRODISTENSION instillation of marcaine and pyridium;  Surgeon: Reece Packer, [name];  Location: [facility];  Service: Urology;  Laterality: N/A;  INSTILLATION     HARDWARE REMOVAL Right 12/03/2018   Procedure: Right tibia screw removal;  Surgeon: Crystal [name];  Location: [facility];  Service: Orthopedics;  Laterality: Right;  32mn   I & D EXTREMITY  09/26/2011   Procedure: IRRIGATION AND DEBRIDEMENT EXTREMITY;  Surgeon: KTennis Must [name];  Location: [facility];  Service: Orthopedics;  Laterality: Right;  I&D right hand cat bite wound   ORIF TIBIA PLATEAU Right 11/27/2017   Procedure: OPEN REDUCTION INTERNAL FIXATION (ORIF) TIBIAL PLATEAU FRACTURE;  Surgeon: Crystal [name];  Location: [facility];  Service: Orthopedics;  Laterality: Right;   PORTACATH PLACEMENT  11/16/2010   placement of left subclavian port NOW REMOVED   right knee torn meniscus surgery      TOTAL HIP ARTHROPLASTY Left 06/13/2018   Procedure: TOTAL HIP ARTHROPLASTY ANTERIOR APPROACH;  Surgeon: Crystal [name];  Location: [facility];  Service: Orthopedics;  Laterality: Left;  147mn   TRANSTHORACIC ECHOCARDIOGRAM  05-28-2012   LVF NORMAL/  EF 55-60%   WISDOM TOOTH EXTRACTION      Allergies  Allergen Reactions   Tramadol Other (See Comments)    Seizures   Phenytoin Nausea And Vomiting   Thorazine [Chlorpromazine Hcl] Hives   Ciprofloxacin Nausea And Vomiting   Vancomycin Rash and Other (See Comments)    Possible rash reported by [name]    Immunization History  Administered Date(s) Administered   Influenza Split 03/05/2012   Influenza,inj,Quad PF,6+ Mos 02/04/2013, 02/05/2021   PFIZER(Purple Top)SARS-COV-2 Vaccination 10/08/2019, 10/29/2019   Tdap 08/20/2019    Family History  Problem Relation Age of Onset   Cancer Maternal Grandfather    Mental illness Brother    Mental illness Son      Current Outpatient Medications:    albuterol (VENTOLIN HFA) 108 (90 Base) MCG/ACT inhaler, Inhale 2 puffs into the lungs every 4 (four) hours as needed for wheezing or shortness of breath., Disp: 18 g, Rfl: 2   ALPRAZolam (XANAX) 1  MG tablet, Take 1 mg by mouth 4 (four) times daily., Disp: , Rfl:    amphetamine-dextroamphetamine (ADDERALL XR) 30 MG 24 hr capsule, Take by mouth every morning., Disp: , Rfl:    amphetamine-dextroamphetamine (ADDERALL) 10 MG tablet, Take 10 mg by mouth daily as needed., Disp: , Rfl:    ciprofloxacin (CIPRO) 750 MG tablet, Take 1 tablet (750 mg total) by mouth 2 (two) times daily., Disp: 20 tablet, Rfl: 0   Eszopiclone 3 MG TABS, Take 3 mg by mouth at bedtime. Take immediately before bedtime, Disp: , Rfl:    gabapentin (NEURONTIN) 600 MG tablet, Take 600 mg by mouth. 3 in morning, two tablets at night, Disp: , Rfl:    ipratropium-albuterol (DUONEB) 0.5-2.5 (3) MG/3ML SOLN, Take 3 mLs by nebulization every 6 (six) hours as needed., Disp: 360 mL, Rfl: 1   OLANZapine-Samidorphan (LYBALVI) 5-10 MG TABS, Take by mouth at bedtime., Disp: , Rfl:    traZODone (DESYREL) 50 MG tablet, Take 50 mg by mouth at bedtime., Disp: , Rfl:       Objective:   Vitals:   04/01/21 1344  BP: 118/68  Pulse: (!) 105  Temp: 98.5 F (36.9 C)  TempSrc: Oral  SpO2: 98%  Weight: 142 lb (64.4 kg)  Height: '5\' 5"'  (1.651 m)    Estimated body mass index is 23.63 kg/m as calculated from the following:   Height as of this encounter: '5\' 5"'  (1.651 m).   Weight as of this encounter: 142 lb (64.4 kg).  '@WEIGHTCHANGE' @  [facility]  04/01/21 1344  Weight: 142 lb (64.4 kg)     Physical ExamGeneral: No distress. Looks well Neuro: Alert and Oriented x 3. GCS 15. Speech normal Psych: Pleasant Resp:  Barrel Chest - no.  Wheeze - no, Crackles - no, No overt respiratory distress CVS: Normal heart sounds. Murmurs - no Ext:  Stigmata of Connective Tissue Disease - no HEENT: Normal upper airway. PEERL +. No post nasal drip        Assessment:       ICD-10-CM   1. Chronic cough  R05.3 Ambulatory referral to ENT    2. Chronic throat clearing  R09.89 Ambulatory referral to ENT    3. History of smoking 10-25 pack  years  Z87.891     4. Infection, Pseudomonas  A49.8     5. Chronic bronchitis, unspecified chronic bronchitis type ([place])  J42          Plan:     Patient Instructions     ICD-10-CM   1. Chronic cough  R05.3     2. Chronic throat clearing  R09.89     3. History of smoking 10-25 pack years  Z87.891     4. Infection, Pseudomonas  A49.8     5. Chronic bronchitis, unspecified chronic bronchitis type ([place])  J42       There is no clinical evidence of asthma COPD or emphysema or lung cancer or pulmonary fibrosis  There is evidence of chronic bronchitis with Pseudomonas infection versus colonization  There are some mild radiation scar in the right upper lobe following her breast cancer radiation.  [Is extremely mild  In addition he might have cough neuropathy or irritable larynx syndrome otherwise call chronic clearing of the throat   Plan 0- Quit smoking -Ciprofloxacin 750 mg twice daily for 10 days  - monitor for any tendon inflammation -Refer ENT  Followup  - Return in 4 weeks to see nurse practitioner  -to discuss residual cough if any present and neck steps    SIGNATURE    Dr. Brand Males, M.D., F.C.C.P,  Pulmonary and Critical Care Medicine Staff Physician, [place] Director - Interstitial Lung Disease  Program  Pulmonary [place] [facility] [place], [place], [address]  Pager: [phone], If no answer or between  15:00h - 7:00h: call [phone] Telephone: [phone]  2:37 PM 04/01/2021

## 2021-04-01 NOTE — Patient Instructions (Addendum)
ICD-10-CM   1. Chronic cough  R05.3     2. Chronic throat clearing  R09.89     3. History of smoking 10-25 pack years  Z87.891     4. Infection, Pseudomonas  A49.8     5. Chronic bronchitis, unspecified chronic bronchitis type ([place])  J42       There is no clinical evidence of asthma COPD or emphysema or lung cancer or pulmonary fibrosis  There is evidence of chronic bronchitis with Pseudomonas infection versus colonization  There are some mild radiation scar in the right upper lobe following her breast cancer radiation.  [Is extremely mild  In addition he might have cough neuropathy or irritable larynx syndrome otherwise call chronic clearing of the throat   Plan 0- Quit smoking -Ciprofloxacin 750 mg twice daily for 10 days  - monitor for any tendon inflammation -Refer ENT  Followup  - Return in 4 weeks to see nurse practitioner  -to discuss residual cough if any present and neck steps

## 2021-04-02 NOTE — Telephone Encounter (Signed)
Pseudomonas is not considered contagious

## 2021-04-02 NOTE — Telephone Encounter (Signed)
MR please advise. Thanks! 

## 2021-04-16 ENCOUNTER — Telehealth: Payer: Self-pay | Admitting: Family Medicine

## 2021-04-16 NOTE — Telephone Encounter (Signed)
Left message for patient to call back and schedule Medicare Annual Wellness Visit (AWV) in office.  ° °If not able to come in office, please offer to do virtually or by telephone.  Left office number and my jabber #[phone]. ° °Due for AWVI ° °Please schedule at anytime with Nurse Health Advisor. °  °

## 2021-04-29 ENCOUNTER — Ambulatory Visit: Payer: Medicare Other | Admitting: Nurse Practitioner

## 2021-04-29 ENCOUNTER — Telehealth: Payer: Self-pay | Admitting: Nurse Practitioner

## 2021-04-29 ENCOUNTER — Ambulatory Visit (INDEPENDENT_AMBULATORY_CARE_PROVIDER_SITE_OTHER): Payer: Medicare Other

## 2021-04-29 ENCOUNTER — Other Ambulatory Visit: Payer: Self-pay

## 2021-04-29 ENCOUNTER — Encounter: Payer: Self-pay | Admitting: Nurse Practitioner

## 2021-04-29 VITALS — BP 102/68 | HR 107 | Temp 98.3°F | Ht 65.0 in | Wt 138.0 lb

## 2021-04-29 DIAGNOSIS — R0602 Shortness of breath: Secondary | ICD-10-CM

## 2021-04-29 DIAGNOSIS — J209 Acute bronchitis, unspecified: Secondary | ICD-10-CM

## 2021-04-29 DIAGNOSIS — R9431 Abnormal electrocardiogram [ECG] [EKG]: Secondary | ICD-10-CM | POA: Diagnosis not present

## 2021-04-29 DIAGNOSIS — J411 Mucopurulent chronic bronchitis: Secondary | ICD-10-CM

## 2021-04-29 DIAGNOSIS — Z720 Tobacco use: Secondary | ICD-10-CM | POA: Insufficient documentation

## 2021-04-29 DIAGNOSIS — F17210 Nicotine dependence, cigarettes, uncomplicated: Secondary | ICD-10-CM

## 2021-04-29 DIAGNOSIS — A498 Other bacterial infections of unspecified site: Secondary | ICD-10-CM

## 2021-04-29 DIAGNOSIS — J310 Chronic rhinitis: Secondary | ICD-10-CM | POA: Diagnosis not present

## 2021-04-29 DIAGNOSIS — J42 Unspecified chronic bronchitis: Secondary | ICD-10-CM

## 2021-04-29 DIAGNOSIS — F419 Anxiety disorder, unspecified: Secondary | ICD-10-CM

## 2021-04-29 DIAGNOSIS — R059 Cough, unspecified: Secondary | ICD-10-CM | POA: Diagnosis not present

## 2021-04-29 LAB — BASIC METABOLIC PANEL
BUN: 15 mg/dL (ref 6–23)
CO2: 27 mEq/L (ref 19–32)
Calcium: 9.6 mg/dL (ref 8.4–10.5)
Chloride: 101 mEq/L (ref 96–112)
Creatinine, Ser: 0.91 mg/dL (ref 0.40–1.20)
GFR: 77.3 mL/min (ref >60.00)
Glucose, Bld: 89 mg/dL (ref 70–99)
Potassium: 4 mEq/L (ref 3.5–5.1)
Sodium: 136 mEq/L (ref 135–145)

## 2021-04-29 LAB — D-DIMER, QUANTITATIVE: D-Dimer, Quant: 0.27 mcg/mL FEU (ref <0.50)

## 2021-04-29 MED ORDER — FLUTICASONE PROPIONATE 50 MCG/ACT NA SUSP: 1.0000 | Freq: Every day | NASAL | 2 refills | Status: DC

## 2021-04-29 MED ORDER — CIPROFLOXACIN HCL 750 MG PO TABS: 750.0000 mg | ORAL_TABLET | Freq: Two times a day (BID) | ORAL | 0 refills | Status: DC

## 2021-04-29 MED ORDER — PREDNISONE 10 MG PO TABS: ORAL_TABLET | ORAL | 0 refills | Status: DC

## 2021-04-29 MED ORDER — CETIRIZINE HCL 10 MG PO TABS: 10.0000 mg | ORAL_TABLET | Freq: Every day | ORAL | 3 refills | Status: DC

## 2021-04-29 MED ORDER — ALBUTEROL SULFATE HFA 108 (90 BASE) MCG/ACT IN AERS: 2.0000 | INHALATION_SPRAY | RESPIRATORY_TRACT | 2 refills | Status: DC | PRN

## 2021-04-29 NOTE — Addendum Note (Signed)
Addended by: Clayton Bibles on: 04/29/2021 05:55 PM   Modules accepted: Orders

## 2021-04-29 NOTE — Assessment & Plan Note (Addendum)
Continues to smoke 1 ppd, more at times. Not appropriate for Wellbutrin given bipolar history and current medications. Not ready to quit - advised we can refer her to pharmacy team for smoking cessation appt when she is. In depth discussion about how this is likely increasing her symptom burden. See above.

## 2021-04-29 NOTE — Assessment & Plan Note (Signed)
Situational per pt. Advised to follow up with psychiatrist or PCP. No SI/HI.

## 2021-04-29 NOTE — Progress Notes (Addendum)
@Patient  19.21 (85): Crystal [name], female    DOB: 11-04-1975, 45 y.o.   MRN: [id number]  Chief Complaint  Patient presents with   Follow-up    She reports that she has is still having some thick green sputum. She feels that she is having to clear her throat more and feels like she is sticking in her throat.     Referring provider: Darreld Mclean, MD  HPI: 45 year old female, current smoker (1 ppd x 20 years) followed for chronic cough, chronic bronchitis, history of Pseudomonas infection and tobacco abuse.  She is a patient of Dr. Golden Pop and was last seen in office on 04/01/2021.  Past medical history significant for MDD with previous suicide attempt, breast cancer Stage IIA, chemotherapy induced cardiomyopathy, DVT upper extremity, fatigue, generalized convulsion seizure, anorexia nervosa, alcohol abuse.  TEST/EVENTS:  08/24/2020 CXR 2 View: both lungs clear. Right apical pleuroparenchymal scarring, similar over previous.  02/04/2021 echocardiogram: LVEF 60-65.  Diastolic parameters normal.  RV function and size normal.  Trivial MV regurgitation. 02/12/2021 CT chest w/o contrast: minimal subpleural radiation fibrosis of the anterior right upper lobe.  Very fine centrilobular nodularity, most concentrated in the lung apices, most commonly seen in smoking-related respiratory bronchiolitis.  No significant emphysema or bronchial wall thickening.  Status post bilateral mastectomy.  Incidental note of apparent retroesophageal origin of the right subclavian artery. 03/15/2021 sputum sample: Susceptible pseudomonas with moderate growth 04/01/2021 PFTs: FVC 3.66 (96), FEV1 2.99 (97), ratio 82%, TLC 5.46 (104), DLCO corrected 19.21 (85)  04/01/2021: OV with Dr. Chase Caller.  Sputum culture obtained by oncologist on 03/15/2021 grew Pseudomonas.  CT chest fairly normal except for some fine centrilobular nodularity in the lung apices and minimal subpleural radiation fibrosis in the anterior right  upper lobe; chronic bronchiolitis; no COPD and no cancer fibrosis ILD.  PFTs normal.  IgE and eos normal.  RSI cough score 41 suggestive of cough neuropathy. Frequent throat clearing. Referred to ENT. Tx with cipro 750 mg Twice daily for 10 days.   04/29/2021: Today - 4 wk follow up Patient presents today for follow up after treatment with ciprofloxacin for pseudomonas. She completed her antibiotic course but did report some nausea with the medication. She continues to experience a productive cough with green sputum production. She has intermittent wheezing, relieved with rescue inhaler, and some shortness of breath with exertion that is unchanged from baseline. She has some associated chest tightness as well and occasionally experiences a stabbing pain over her heart, which has been going on for months. She does not associate this to any activity and the pain resolves on it's own without intervention. She denies dizziness or palpitations. Her previous echo was relatively normal with trivial MV regurgitation. She continues to smoke 1 ppd or more, depending on her nerves. Her husband was diagnosed with an aneurysm but has not been referred for surgical repair yet and she states that this has resulted in worsening anxiety and that as a result, she has been smoking more. She states that it is the only thing that helps right now and she is not ready to quit until things improve at home. She denies any recent fevers, chills or bodyaches. She denies orthopnea, PND, or lower extremity swelling. She utilizes her duoneb nebulizer a few times a day and uses her rescue inhaler a few times a week. Overall, she feels ok but has noticed little improvement in her symptoms.   Allergies  Allergen Reactions   Thorazine [Chlorpromazine Hcl] Hives  Tramadol Other (See Comments)    Seizures   Phenytoin Nausea And Vomiting   Ciprofloxacin Nausea And Vomiting   Vancomycin Rash and Other (See Comments)    Possible rash  reported by MD    Immunization History  Administered Date(s) Administered   Influenza Split 03/05/2012   Influenza,inj,Quad PF,6+ Mos 02/04/2013, 02/05/2021   PFIZER(Purple Top)SARS-COV-2 Vaccination 10/08/2019, 10/29/2019   Tdap 08/20/2019    Past Medical History:  Diagnosis Date   Alcohol abuse    Anorexia nervosa    Anxiety    Avascular necrosis of bone of hip ([place])    Bipolar affective disorder, mixed ([place])    Cancer ([place])    Chronic pain    Depression    Dilated cardiomyopathy secondary to drug ([place])    DJD (degenerative joint disease)    "BONE STICKS OUT ON LEFT PART OF LOWER BACK"   DVT of upper extremity (deep vein thrombosis) ([place]) 2014   Edema of upper extremity    Family history of anesthesia complication    MOTHER PONV   Frequency of urination    GERD (gastroesophageal reflux disease)    OCCASIONALLY TAKE ZANTAC   History of breast cancer DX JUNE 2012 W/ RIGHT BREAST INVASIVE DUCTAL CA  STAGE IIB---  S/P BILATERAL MASTECTOMIES AND RIGHT NODE DISSECTION   ONCOLOGIST-- DR Jana Hakim--  CHEMO ENDED [place] 2012 / RADIATION ENDED MAR 2013--  CURRENT ON TAMOXIFEN AND ZOLADEX   History of idiopathic seizure    DX 2008 --  LAST ONE 2012--  NO ISSUES SINCE AND NO MEDS. -- PT STATES DOCTORS FELT IT WAS STRESS RELATED DUE TO HEALTH ISSUES   Hot flashes due to tamoxifen    Hx MRSA infection 03/28/11   Skin   Left ovarian cyst    Migraines    Neuropathy    Nocturia    Right lower quadrant abdominal pain    Seizures ([place])    2016 last seizure    Status post chemotherapy    docetaxel/carboplatin/trastuzumab   Strains to urinate    Urgency of urination     Tobacco History: Social History   Tobacco Use  Smoking Status Every Day   Packs/day: 1.00   Years: 20.00   Pack years: 20.00   Types: Cigarettes  Smokeless Tobacco Never   Ready to quit: Not Answered Counseling given: Not Answered   Outpatient Medications Prior to Visit  Medication Sig Dispense Refill    ALPRAZolam (XANAX) 1 MG tablet Take 1 mg by mouth 4 (four) times daily.     amphetamine-dextroamphetamine (ADDERALL XR) 30 MG 24 hr capsule Take by mouth every morning.     amphetamine-dextroamphetamine (ADDERALL) 10 MG tablet Take 10 mg by mouth daily as needed.     Eszopiclone 3 MG TABS Take 3 mg by mouth at bedtime. Take immediately before bedtime     gabapentin (NEURONTIN) 600 MG tablet Take 600 mg by mouth. 3 in morning, two tablets at night     ipratropium-albuterol (DUONEB) 0.5-2.5 (3) MG/3ML SOLN Take 3 mLs by nebulization every 6 (six) hours as needed. 360 mL 1   OLANZapine-Samidorphan (LYBALVI) 5-10 MG TABS Take by mouth at bedtime.     traZODone (DESYREL) 50 MG tablet Take 50 mg by mouth at bedtime.     albuterol (VENTOLIN HFA) 108 (90 Base) MCG/ACT inhaler Inhale 2 puffs into the lungs every 4 (four) hours as needed for wheezing or shortness of breath. 18 g 2   ciprofloxacin (CIPRO) 750 MG tablet  Take 1 tablet (750 mg total) by mouth 2 (two) times daily. (Patient not taking: Reported on 04/29/2021) 20 tablet 0   No facility-administered medications prior to visit.     Review of Systems:   Constitutional: No weight loss or gain, night sweats, fevers, chills, fatigue, or lassitude. HEENT: No headaches, difficulty swallowing, tooth/dental problems, or sore throat. No sneezing, itching, ear ache. +intermittent nasal congestion, post nasal drip CV:  No orthopnea, PND, swelling in lower extremities, anasarca, dizziness, palpitations, syncope. +occasional stabbing sensation left chest (unchanged) Resp: +shortness of breath with exertion; productive cough with green sputum production (unchanged); wheezing. No hemoptysis. No chest wall deformity GI:  No heartburn, indigestion, abdominal pain, nausea, vomiting, diarrhea, change in bowel habits, loss of appetite, bloody stools.  GU: No dysuria, change in color of urine, urgency or frequency.  No flank pain, no hematuria  Skin: No rash,  lesions, ulcerations MSK:  No joint pain or swelling.  No decreased range of motion.  No back pain. Neuro: No dizziness or lightheadedness.  Psych: +anxiety. MDD stable.     Physical Exam:  BP 102/68 (BP Location: Left Arm, Patient Position: Sitting, Cuff Size: Normal)    Pulse (!) 107    Temp 98.3 F (36.8 C) (Oral)    Ht 5\' 5"  (1.651 m)    Wt 138 lb (62.6 kg)    SpO2 98%    BMI 22.96 kg/m   GEN: Pleasant, interactive, well-appearing; in no acute distress. HEENT:  Normocephalic and atraumatic. EACs patent bilaterally. TM pearly gray with present light reflex bilaterally. PERRLA. Sclera white. Nasal turbinates pink, moist and patent bilaterally. No rhinorrhea present. Oropharynx erythematous and moist, without exudate or edema. No lesions, ulcerations NECK:  Supple w/ fair ROM. No JVD present. Normal carotid impulses w/o bruits. Thyroid symmetrical with no goiter or nodules palpated. No lymphadenopathy.   CV: RRR, no m/r/g, no peripheral edema. Pulses intact, +2 bilaterally. No cyanosis, pallor or clubbing. PULMONARY:  Unlabored, regular breathing. Clear bilaterally A&P w/o wheezes/rales/rhonchi. No accessory muscle use. No dullness to percussion. GI: BS present and normoactive. Soft, non-tender to palpation. No organomegaly or masses detected. No CVA tenderness. MSK: No erythema, warmth or tenderness. Cap refil <2 sec all extrem. No deformities or joint swelling noted.  Neuro: A/Ox3. No focal deficits noted.   Skin: Warm, no lesions or rashe Psych: Normal affect and behavior. Judgement and thought content appropriate.     Lab Results:  CBC    Component Value Date/Time   WBC 7.9 02/22/2021 1343   RBC 4.86 02/22/2021 1343   HGB 15.9 (H) 02/22/2021 1343   HGB 14.8 01/03/2017 1420   HCT 43.0 02/22/2021 1343   HCT 41.7 01/03/2017 1420   PLT 260 02/22/2021 1343   PLT 204 01/03/2017 1420   MCV 88.5 02/22/2021 1343   MCV 95.2 01/03/2017 1420   MCH 32.7 02/22/2021 1343   MCHC 37.0  (H) 02/22/2021 1343   RDW 12.3 02/22/2021 1343   RDW 12.5 01/03/2017 1420   LYMPHSABS 1.7 02/22/2021 1343   LYMPHSABS 2.2 01/03/2017 1420   MONOABS 0.4 02/22/2021 1343   MONOABS 0.3 01/03/2017 1420   EOSABS 0.0 02/22/2021 1343   EOSABS 0.1 01/03/2017 1420   BASOSABS 0.0 02/22/2021 1343   BASOSABS 0.0 01/03/2017 1420    BMET    Component Value Date/Time   NA 136 04/29/2021 1223   NA 140 01/03/2017 1420   K 4.0 04/29/2021 1223   K 3.5 01/03/2017 1420   CL 101  04/29/2021 1223   CL 105 10/11/2012 1441   CO2 27 04/29/2021 1223   CO2 25 01/03/2017 1420   GLUCOSE 89 04/29/2021 1223   GLUCOSE 93 01/03/2017 1420   GLUCOSE 97 10/11/2012 1441   BUN 15 04/29/2021 1223   BUN 15.2 01/03/2017 1420   CREATININE 0.91 04/29/2021 1223   CREATININE 0.88 02/22/2021 1343   CREATININE 0.9 01/03/2017 1420   CALCIUM 9.6 04/29/2021 1223   CALCIUM 9.7 01/03/2017 1420   GFRNONAA >60 02/22/2021 1343   GFRAA >60 08/21/2019 1839    BNP No results found for: BNP   Imaging:  04/29/2021: CXR reviewed by me. No acute cardiopulmonary disease. Lungs clear.   DG Chest 2 View  Result Date: 04/29/2021 CLINICAL DATA:  Persisting cough EXAM: CHEST - 2 VIEW COMPARISON:  Chest x-ray dated August 24, 2020 FINDINGS: Cardiac and mediastinal contours are within normal limits. Unchanged right apical pleuroparenchymal scarring. Surgical clips noted over the right axilla. Lungs are clear. No pleural effusion, although the most inferior portions of the costophrenic angles are excluded from the field of view. No pneumothorax. IMPRESSION: No active cardiopulmonary disease. Electronically Signed   By: Yetta Glassman M.D.   On: 04/29/2021 12:30      PFT Results Latest Ref Rng & Units 04/01/2021  FVC-Pre L 3.66  FVC-Predicted Pre % 96  FVC-Post L 3.65  FVC-Predicted Post % 96  Pre FEV1/FVC % % 78  Post FEV1/FCV % % 82  FEV1-Pre L 2.84  FEV1-Predicted Pre % 93  FEV1-Post L 2.99  DLCO uncorrected ml/min/mmHg  20.54  DLCO UNC% % 91  DLCO corrected ml/min/mmHg 19.21  DLCO COR %Predicted % 85  DLVA Predicted % 91  TLC L 5.46  TLC % Predicted % 104  RV % Predicted % 117    Lab Results  Component Value Date   NITRICOXIDE 9 04/01/2021     EKG today: SR with QT 338 msec. Right atrial enlargement.   Assessment & Plan:   Chronic bronchitis ([place]) Continues to experience frequent productive cough and occasional chest tightness/wheezing. Suspect cough is multifactorial given recent positive sputum culture for pseudomonas, increased smoking and postnasal drip but she did have changes of mild chronic bronchitis on CT chest. Will tx with prednisone taper - if improvement occurs, may consider maintenance inhaler therapy with ICS or ICS/LABA. Strongly advised smoking cessation and discussed in depth with pt who is not ready to quit. Agreed to notify when she is. CXR negative today with clear lungs.  Patient Instructions  Continue albuterol 2 puffs every 4 hours as needed for shortness of breath or wheezing Continue duonebs 21mL every 6 hours as needed for shortness of breath or wheezing  -Flonase (fluticazone) nasal spray 1-2 sprays each nostril daily for postnasal drip/congestion -Zyrtec (cetrizine) 10 mg daily for postnasal drip/congestion/allergy symptoms -Prednisone taper pack. 4 tabs for 2 days, then 3 tabs for 2 days, 2 tabs for 2 days, then 1 tab for 2 days, then stop. Take in AM with food  Sputum culture today. If you cannot return it to [place] within an hour, place in refrigerator and try to bring back the same day. Ensure you have it back to [place] before 12 PM tomorrow at the latest. We will hold off on another round of ciprofloxacin (antibiotics) if you can get [place] a sputum culture within 24 hours; otherwise, we will treat empirically based on your symptoms.  Chest x ray today. We will notify you of results.   EKG to  evaluate QT length.  Labs today - d dimer and BMET. We will notify you of  results.  Smoking cessation strongly advised. Your symptoms may not improve significantly until you quit smoking. Please let us know when you are ready to discuss further interventions so we can help!   Follow up in 2 weeks with Dr. Chase Caller or Roxan Diesel, NP. If symptoms do not improve or worsen, please contact office for sooner follow up or seek emergency care.   Chronic rhinitis Awaiting appointment with ENT. Rx for Zyrtec and flonase daily in the interim due to reports of post nasal drip. Could be contributed to cough. See above.  Pseudomonas infection Discussed case with Dr. Erin Fulling. Will repeat sputum culture if patient can return sample within 24 hours. Due to clinical stability, appropriate to wait. If unable to obtain sputum culture, will empirically treat with cipro 750 mg Twice daily   Abnormal EKG SR with right atrial enlargement. QT normal. No T wave elevation or depression. Will obtain d dimer to rule out PE given history of DVT and other symptoms. Consider repeat echocardiogram for further evaluation if no underlying cause identified. Referral to cardiology with unspecific chest pain and EKG abnormality.   Tobacco abuse Continues to smoke 1 ppd, more at times. Not appropriate for Wellbutrin given bipolar history and current medications. Not ready to quit - advised we can refer her to pharmacy team for smoking cessation appt when she is. In depth discussion about how this is likely increasing her symptom burden. See above.   Anxiety disorder Situational per pt. Advised to follow up with psychiatrist or PCP. No SI/HI.   Clayton Bibles, NP 04/29/2021  Pt aware and understands NP's role.

## 2021-04-29 NOTE — Patient Instructions (Addendum)
Continue albuterol 2 puffs every 4 hours as needed for shortness of breath or wheezing Continue duonebs 66mL every 6 hours as needed for shortness of breath or wheezing  -Flonase (fluticazone) nasal spray 1-2 sprays each nostril daily for postnasal drip/congestion -Zyrtec (cetrizine) 10 mg daily for postnasal drip/congestion/allergy symptoms -Prednisone taper pack. 4 tabs for 2 days, then 3 tabs for 2 days, 2 tabs for 2 days, then 1 tab for 2 days, then stop. Take in AM with food  Sputum culture today. If you cannot return it to [place] within an hour, place in refrigerator and try to bring back the same day. Ensure you have it back to [place] before 12 PM tomorrow at the latest. We will hold off on another round of ciprofloxacin (antibiotics) if you can get [place] a sputum culture within 24 hours; otherwise, we will treat empirically based on your symptoms.  Chest x ray today. We will notify you of results.   EKG to evaluate QT length.  Labs today - d dimer and BMET. We will notify you of results.  Smoking cessation strongly advised. Your symptoms may not improve significantly until you quit smoking. Please let us know when you are ready to discuss further interventions so we can help!   Follow up in 2 weeks with Crystal [name] or Crystal Diesel, NP. If symptoms do not improve or worsen, please contact office for sooner follow up or seek emergency care.

## 2021-04-29 NOTE — Assessment & Plan Note (Signed)
Discussed case with Dr. Erin Fulling. Will repeat sputum culture if patient can return sample within 24 hours. Due to clinical stability, appropriate to wait. If unable to obtain sputum culture, will empirically treat with cipro 750 mg Twice daily

## 2021-04-29 NOTE — Telephone Encounter (Signed)
Contacted patient to notify that CXR was clear with no signs of infection. D dimer was negative, indicating low probability of pulmonary embolism. Awaiting sputum culture from patient. Nothing further at this time.

## 2021-04-29 NOTE — Addendum Note (Signed)
Addended by: Dessie Coma on: 04/29/2021 04:37 PM   Modules accepted: Orders

## 2021-04-29 NOTE — Assessment & Plan Note (Addendum)
Continues to experience frequent productive cough and occasional chest tightness/wheezing. Suspect cough is multifactorial given recent positive sputum culture for pseudomonas, increased smoking and postnasal drip but she did have changes of mild chronic bronchitis on CT chest. Will tx with prednisone taper - if improvement occurs, may consider maintenance inhaler therapy with ICS or ICS/LABA. Strongly advised smoking cessation and discussed in depth with pt who is not ready to quit. Agreed to notify when she is. CXR negative today with clear lungs.  Patient Instructions  Continue albuterol 2 puffs every 4 hours as needed for shortness of breath or wheezing Continue duonebs 85mL every 6 hours as needed for shortness of breath or wheezing  -Flonase (fluticazone) nasal spray 1-2 sprays each nostril daily for postnasal drip/congestion -Zyrtec (cetrizine) 10 mg daily for postnasal drip/congestion/allergy symptoms -Prednisone taper pack. 4 tabs for 2 days, then 3 tabs for 2 days, 2 tabs for 2 days, then 1 tab for 2 days, then stop. Take in AM with food  Sputum culture today. If you cannot return it to [place] within an hour, place in refrigerator and try to bring back the same day. Ensure you have it back to [place] before 12 PM tomorrow at the latest. We will hold off on another round of ciprofloxacin (antibiotics) if you can get [place] a sputum culture within 24 hours; otherwise, we will treat empirically based on your symptoms.  Chest x ray today. We will notify you of results.   EKG to evaluate QT length.  Labs today - d dimer and BMET. We will notify you of results.  Smoking cessation strongly advised. Your symptoms may not improve significantly until you quit smoking. Please let us know when you are ready to discuss further interventions so we can help!   Follow up in 2 weeks with Dr. Chase Caller or Roxan Diesel, NP. If symptoms do not improve or worsen, please contact office for sooner follow up or seek emergency  care.

## 2021-04-29 NOTE — Assessment & Plan Note (Addendum)
Awaiting appointment with ENT. Rx for Zyrtec and flonase daily in the interim due to reports of post nasal drip. Could be contributed to cough. See above.

## 2021-04-29 NOTE — Assessment & Plan Note (Addendum)
SR with right atrial enlargement. QT normal. No T wave elevation or depression. Will obtain d dimer to rule out PE given history of DVT and other symptoms. Consider repeat echocardiogram for further evaluation if no underlying cause identified. Referral to cardiology with unspecific chest pain and EKG abnormality.

## 2021-04-30 ENCOUNTER — Other Ambulatory Visit: Payer: Medicare Other

## 2021-04-30 DIAGNOSIS — A498 Other bacterial infections of unspecified site: Secondary | ICD-10-CM

## 2021-05-06 ENCOUNTER — Telehealth: Payer: Self-pay | Admitting: Nurse Practitioner

## 2021-05-06 LAB — AFB CULTURE WITH SMEAR (NOT AT ARMC)
Acid Fast Culture: NEGATIVE
Acid Fast Smear: NEGATIVE

## 2021-05-06 NOTE — Telephone Encounter (Signed)
Attempted to contact patient to determine if she was able to collect sputum sample and to see if her symptoms have improved or if she has continued to have a productive cough. Left VM to return phone call.

## 2021-05-06 NOTE — Telephone Encounter (Signed)
[name] for patient.  

## 2021-05-13 ENCOUNTER — Ambulatory Visit: Payer: Medicare Other | Admitting: Nurse Practitioner

## 2021-05-13 NOTE — Telephone Encounter (Signed)
Lmtcb for pt. See phone note from 1/5 from [facility], NP.

## 2021-05-17 ENCOUNTER — Telehealth: Payer: Self-pay | Admitting: Nurse Practitioner

## 2021-05-17 NOTE — Telephone Encounter (Signed)
What symptoms is she still experiencing? Our main concern at her last appointment was the productive cough with green sputum. I am glad this has improved. Is she still continuing to have a cough and wheezing? We could consider another round of prednisone, if so. My other concern is the amount that she is smoking and this negatively contributing to her cough. I am sorry she is still not 100% better. I hope her husband is doing okay! Thanks.

## 2021-05-17 NOTE — Telephone Encounter (Signed)
Spoke with the pt  She states calling to let us know that she is still having same exact symptoms as last ov  Very minimal sputum production- clear to yellow  She states that she missed her last ov and does not know when she will be able to come back due to spouse's health  I offered video visit at least and she says would have to call back later to schedule this  Will forward to Joellen [place] so she is aware

## 2021-05-19 NOTE — Telephone Encounter (Signed)
Spoke with the pt  She is no longer wheezing, just has occ cough with clear sputum  She says feels better since I spoke with her last  She is still smoking and is aware of this contributing to her cough  She will call if needed

## 2021-05-24 NOTE — Progress Notes (Signed)
Referring-Jessica Copland, MD Reason for referral-chest pain, dyspnea and abnormal ECG  HPI: 47 year old female for evaluation of chest pain, dyspnea and abnormal ECG at request of Lamar Blinks, MD. Previously seen by Dr. Haroldine Laws but not since 2014.  Also with history of breast cancer with chemotherapy-induced cardiomyopathy (s/p carboplatin/docetaxol/trastuzumab x6, completed mid December 2012), DVT of upper extremity, anorexia nervosa and alcohol abuse.  Also with chronic bronchitis.  Echocardiogram October 2022 showed normal LV function.  Patient has had intermittent chest pain for approximately 1 year.  It is in the left chest area radiating to her left upper extremity.  It occurs when she is anxious and lasts approximately 1 hour.  There is associated dyspnea but no nausea or diaphoresis.  The pain is not pleuritic or positional.  She otherwise denies exertional chest pain.  She has dyspnea on exertion but no orthopnea, PND or pedal edema.  She denies syncope.  Cardiology now asked to evaluate.  Current Outpatient Medications  Medication Sig Dispense Refill   ALPRAZolam (XANAX) 1 MG tablet Take 1 mg by mouth 4 (four) times daily.     amphetamine-dextroamphetamine (ADDERALL XR) 30 MG 24 hr capsule Take by mouth every morning.     amphetamine-dextroamphetamine (ADDERALL) 10 MG tablet Take 10 mg by mouth daily as needed.     esomeprazole (NEXIUM) 20 MG capsule Take 20 mg by mouth daily in the afternoon.     Eszopiclone 3 MG TABS Take 3 mg by mouth at bedtime. Take immediately before bedtime     gabapentin (NEURONTIN) 600 MG tablet Take 600 mg by mouth. 3 in morning, two tablets at night     ipratropium-albuterol (DUONEB) 0.5-2.5 (3) MG/3ML SOLN Take 3 mLs by nebulization every 6 (six) hours as needed. 360 mL 1   OLANZapine-Samidorphan (LYBALVI) 5-10 MG TABS Take by mouth at bedtime.     albuterol (VENTOLIN HFA) 108 (90 Base) MCG/ACT inhaler Inhale 2 puffs into the lungs every 4 (four)  hours as needed for wheezing or shortness of breath. (Patient not taking: Reported on 06/04/2021) 18 g 2   cetirizine (ZYRTEC ALLERGY) 10 MG tablet Take 1 tablet (10 mg total) by mouth daily. (Patient not taking: Reported on 06/04/2021) 30 tablet 3   ciprofloxacin (CIPRO) 750 MG tablet Take 1 tablet (750 mg total) by mouth 2 (two) times daily. (Patient not taking: Reported on 06/04/2021) 14 tablet 0   fluticasone (FLONASE) 50 MCG/ACT nasal spray Place 1 spray into both nostrils daily. (Patient not taking: Reported on 06/04/2021) 16 g 2   predniSONE (DELTASONE) 10 MG tablet 4 tabs for 2 days, then 3 tabs for 2 days, 2 tabs for 2 days, then 1 tab for 2 days, then stop (Patient not taking: Reported on 06/04/2021) 20 tablet 0   traZODone (DESYREL) 50 MG tablet Take 50 mg by mouth at bedtime. (Patient not taking: Reported on 06/04/2021)     No current facility-administered medications for this visit.    Allergies  Allergen Reactions   Thorazine [Chlorpromazine Hcl] Hives   Tramadol Other (See Comments)    Seizures   Phenytoin Nausea And Vomiting   Ciprofloxacin Nausea And Vomiting   Vancomycin Rash and Other (See Comments)    Possible rash reported by MD     Past Medical History:  Diagnosis Date   Alcohol abuse    Anorexia nervosa    Anxiety    Avascular necrosis of bone of hip (HCC)    Bipolar affective disorder, mixed ([place])  Chronic pain    Depression    Dilated cardiomyopathy secondary to drug ([place])    DJD (degenerative joint disease)    "BONE STICKS OUT ON LEFT PART OF LOWER BACK"   DVT of upper extremity (deep vein thrombosis) ([place]) 2014   Edema of upper extremity    Family history of anesthesia complication    MOTHER PONV   GERD (gastroesophageal reflux disease)    OCCASIONALLY TAKE ZANTAC   History of breast cancer DX JUNE 2012 W/ RIGHT BREAST INVASIVE DUCTAL CA  STAGE IIB---  S/P BILATERAL MASTECTOMIES AND RIGHT NODE DISSECTION   ONCOLOGIST-- DR Jana Hakim--  CHEMO ENDED DEC 2012 /  RADIATION ENDED MAR 2013--  CURRENT ON TAMOXIFEN AND ZOLADEX   History of idiopathic seizure    DX 2008 --  LAST ONE 2012--  NO ISSUES SINCE AND NO MEDS. -- PT STATES DOCTORS FELT IT WAS STRESS RELATED DUE TO HEALTH ISSUES   Hot flashes due to tamoxifen    Hx MRSA infection 03/28/2011   Skin   Left ovarian cyst    Migraines    Neuropathy    Seizures ([place])    2016 last seizure    Status post chemotherapy    docetaxel/carboplatin/trastuzumab    Past Surgical History:  Procedure Laterality Date   BIOPSY BREAST  10/14/2010   right needle core biopsy   BREAST SURGERY  11/16/2010   bilateral mastectomy+ right axillary node dissection,T1cN1a, Her2+,ERPR+  (right breast invasive ductal carcinoma)   CYSTO WITH HYDRODISTENSION N/A 07/23/2012   Procedure: CYSTOSCOPY/HYDRODISTENSION instillation of marcaine and pyridium;  Surgeon: Reece Packer, MD;  Location: [facility];  Service: Urology;  Laterality: N/A;  INSTILLATION     HARDWARE REMOVAL Right 12/03/2018   Procedure: Right tibia screw removal;  Surgeon: Gaynelle Arabian, MD;  Location[facility];  Service: Orthopedics;  Laterality: Right;  47mn   I & D EXTREMITY  09/26/2011   Procedure: IRRIGATION AND DEBRIDEMENT EXTREMITY;  Surgeon: KTennis Must MD;  [facility];  Service: Orthopedics;  Laterality: Right;  I&D right hand cat bite wound   ORIF TIBIA PLATEAU Right 11/27/2017   Procedure: OPEN REDUCTION INTERNAL FIXATION (ORIF) TIBIAL PLATEAU FRACTURE;  Surgeon: AGaynelle Arabian MD;  [facility];  Service: Orthopedics;  Laterality: Right;   PORTACATH PLACEMENT  11/16/2010   placement of left subclavian port NOW REMOVED   right knee torn meniscus surgery      TOTAL HIP ARTHROPLASTY Left 06/13/2018   Procedure: TOTAL HIP ARTHROPLASTY ANTERIOR APPROACH;  Surgeon: AGaynelle Arabian MD;  [facility];  Service: Orthopedics;  Laterality: Left;  1080m   TRANSTHORACIC ECHOCARDIOGRAM  05-28-2012   LVF NORMAL/  EF 55-60%    WISDOM TOOTH EXTRACTION      Social History   Socioeconomic History   Marital status: Married    Spouse name: CaGlendell Docker Number of children: 1   Years of education: HS   Highest education level: Not on file  Occupational History    Employer: NOT EMPLOYED  Tobacco Use   Smoking status: Every Day    Packs/day: 1.00    Years: 20.00    Pack years: 20.00    Types: Cigarettes   Smokeless tobacco: Never  Vaping Use   Vaping Use: Never used  Substance and Sexual Activity   Alcohol use: Yes    Comment: 3 times a week   Drug use: No    Comment: LAST [place]ED [place]AINE AND [place]   Sexual  activity: Yes    Partners: Male    Birth control/protection: Injection    Comment: "zylodex" to "shut down ovaries"  Other Topics Concern   Not on file  Social History Narrative   Married 4 years   1 son age 56   Unemployed      Grandfather died unknown malignacy   No family h/o breast, ovarian or colon ca      Menarche age 84      Patient lives at home with spouse.   Caffeine Use: Up to 1 pot of coffee daily                  Social Determinants of Health   Financial Resource Strain: Not on file  Food Insecurity: Not on file  Transportation Needs: Not on file  Physical Activity: Not on file  Stress: Not on file  Social Connections: Not on file  Intimate Partner Violence: Not on file    Family History  Problem Relation Age of Onset   Cancer Maternal Grandfather    Mental illness Brother    Mental illness Son     ROS: Chronic cough but no fevers or chills, hemoptysis, dysphasia, odynophagia, melena, hematochezia, dysuria, hematuria, rash, seizure activity, orthopnea, PND, pedal edema, claudication. Remaining systems are negative.  Physical Exam:   Blood pressure 118/80, pulse 83, height '5\' 4"'  (1.626 m), weight 146 lb 9.6 oz (66.5 kg), SpO2 100 %.  General:  Well developed/well nourished in NAD Skin warm/dry Patient not depressed No peripheral  clubbing Back-normal HEENT-normal/normal eyelids Neck supple/normal carotid upstroke bilaterally; no bruits; no JVD; no thyromegaly chest - CTA/ normal expansion CV - RRR/normal S1 and S2; no murmurs, rubs or gallops;  PMI nondisplaced Abdomen -NT/ND, no HSM, no mass, + bowel sounds, no bruit 2+ femoral pulses, no bruits Ext-no edema, chords, 2+ DP Neuro-grossly nonfocal  ECG -April 29, 2021-normal sinus rhythm, right atrial enlargement.  Personally reviewed Today's electrocardiogram shows sinus rhythm at a rate of 83 with no ST changes, personally reviewed.  A/P  1 chest pain-symptoms are atypical.  I will arrange a cardiac CTA to rule out obstructive coronary disease.  2 dyspnea-likely from history of tobacco use/bronchitis.  Previous echocardiogram showed normal LV function and she is not volume overloaded on examination.  3 history of chemotherapy-induced cardiomyopathy-recent echocardiogram showed normal LV function.  4 abnormal ECG-recent ECG revealed right atrial enlargement though this was not evidence on echocardiogram.  No other structural abnormalities noted on echocardiogram and we will therefore not pursue further evaluation.  Also note follow-up ECG did not show right atrial enlargement.  Kirk Ruths, MD

## 2021-05-25 DIAGNOSIS — K219 Gastro-esophageal reflux disease without esophagitis: Secondary | ICD-10-CM | POA: Diagnosis not present

## 2021-05-25 DIAGNOSIS — F17200 Nicotine dependence, unspecified, uncomplicated: Secondary | ICD-10-CM | POA: Diagnosis not present

## 2021-05-25 DIAGNOSIS — H93293 Other abnormal auditory perceptions, bilateral: Secondary | ICD-10-CM | POA: Diagnosis not present

## 2021-05-25 DIAGNOSIS — R1314 Dysphagia, pharyngoesophageal phase: Secondary | ICD-10-CM | POA: Diagnosis not present

## 2021-05-27 ENCOUNTER — Other Ambulatory Visit: Payer: Self-pay | Admitting: Otolaryngology

## 2021-05-27 DIAGNOSIS — R1314 Dysphagia, pharyngoesophageal phase: Secondary | ICD-10-CM

## 2021-05-27 DIAGNOSIS — K219 Gastro-esophageal reflux disease without esophagitis: Secondary | ICD-10-CM

## 2021-06-04 ENCOUNTER — Other Ambulatory Visit: Payer: Self-pay

## 2021-06-04 ENCOUNTER — Encounter: Payer: Self-pay | Admitting: Cardiology

## 2021-06-04 ENCOUNTER — Ambulatory Visit: Payer: Medicare Other | Admitting: Cardiology

## 2021-06-04 VITALS — BP 118/80 | HR 83 | Ht 64.0 in | Wt 146.6 lb

## 2021-06-04 DIAGNOSIS — R9431 Abnormal electrocardiogram [ECG] [EKG]: Secondary | ICD-10-CM

## 2021-06-04 DIAGNOSIS — R0602 Shortness of breath: Secondary | ICD-10-CM | POA: Diagnosis not present

## 2021-06-04 DIAGNOSIS — R072 Precordial pain: Secondary | ICD-10-CM | POA: Diagnosis not present

## 2021-06-04 DIAGNOSIS — I420 Dilated cardiomyopathy: Secondary | ICD-10-CM | POA: Diagnosis not present

## 2021-06-04 LAB — BASIC METABOLIC PANEL
BUN/Creatinine Ratio: 12 (ref 9–23)
BUN: 9 mg/dL (ref 6–24)
CO2: 21 mmol/L (ref 20–29)
Calcium: 9.1 mg/dL (ref 8.7–10.2)
Chloride: 105 mmol/L (ref 96–106)
Creatinine, Ser: 0.76 mg/dL (ref 0.57–1.00)
Glucose: 59 mg/dL — ABNORMAL LOW (ref 70–99)
Potassium: 4.1 mmol/L (ref 3.5–5.2)
Sodium: 139 mmol/L (ref 134–144)
eGFR: 100 mL/min/{1.73_m2} (ref >59)

## 2021-06-04 MED ORDER — METOPROLOL TARTRATE 100 MG PO TABS: ORAL_TABLET | ORAL | 0 refills | Status: DC

## 2021-06-04 NOTE — Patient Instructions (Signed)
°  Testing/Procedures:  Your cardiac CT will be scheduled at   [facility] [place], [place] [address] [phone]   If scheduled at [facility], please arrive at the [facility] main entrance (entrance A) of [facility] 30 minutes prior to test start time. You can use the FREE valet parking offered at the main entrance (encouraged to control the heart rate for the test) Proceed to the [facility] Radiology Department (first floor) to check-in and test prep.   Please follow these instructions carefully (unless otherwise directed):   On the Night Before the Test: Be sure to Drink plenty of water. Do not consume any caffeinated/decaffeinated beverages or chocolate 12 hours prior to your test. Do not take any antihistamines 12 hours prior to your test.   On the Day of the Test: Drink plenty of water until 1 hour prior to the test. Do not eat any food 4 hours prior to the test. You may take your regular medications prior to the test.  Take metoprolol (Lopressor) 100 mg two hours prior to test. HOLD Furosemide/Hydrochlorothiazide morning of the test. FEMALES- please wear underwire-free bra if available, avoid dresses & tight clothing       After the Test: Drink plenty of water. After receiving IV contrast, you may experience a mild flushed feeling. This is normal. On occasion, you may experience a mild rash up to 24 hours after the test. This is not dangerous. If this occurs, you can take Benadryl 25 mg and increase your fluid intake. If you experience trouble breathing, this can be serious. If it is severe call 911 IMMEDIATELY. If it is mild, please call our office. If you take any of these medications: Glipizide/Metformin, Avandament, Glucavance, please do not take 48 hours after completing test unless otherwise instructed.  We will call to schedule your test 2-4 weeks out understanding that some insurance companies will need an  authorization prior to the service being performed.   For non-scheduling related questions, please contact the cardiac imaging nurse navigator should you have any questions/concerns: Marchia Bond, Cardiac Imaging Nurse Navigator Gordy Clement, Cardiac Imaging Nurse Navigator [facility] Direct Office Dial: [phone]   For scheduling needs, including cancellations and rescheduling, please call [place], [phone].    Follow-Up: At [facility], you and your health needs are our priority.  As part of our continuing mission to provide you with exceptional heart care, we have created designated Provider Care Teams.  These Care Teams include your primary Cardiologist (physician) and Advanced Practice Providers (APPs -  Physician Assistants and Nurse Practitioners) who all work together to provide you with the care you need, when you need it.  We recommend signing up for the patient portal called "MyChart".  Sign up information is provided on this After Visit Summary.  MyChart is used to connect with patients for Virtual Visits (Telemedicine).  Patients are able to view lab/test results, encounter notes, upcoming appointments, etc.  Non-urgent messages can be sent to your provider as well.   To learn more about what you can do with MyChart, go to [email or web address].    Your next appointment:    AS NEEDED

## 2021-06-08 DIAGNOSIS — H903 Sensorineural hearing loss, bilateral: Secondary | ICD-10-CM | POA: Diagnosis not present

## 2021-06-08 DIAGNOSIS — H905 Unspecified sensorineural hearing loss: Secondary | ICD-10-CM | POA: Diagnosis not present

## 2021-06-14 ENCOUNTER — Telehealth (HOSPITAL_COMMUNITY): Payer: Self-pay | Admitting: *Deleted

## 2021-06-14 NOTE — Telephone Encounter (Signed)
Reaching out to patient to offer assistance regarding upcoming cardiac imaging study; pt verbalizes understanding of appt date/time, parking situation and where to check in, pre-test NPO status and medications ordered, and verified current allergies; name and call back number provided for further questions should they arise  Gordy Clement RN Navigator Cardiac Imaging [facility] [phone] office [phone] cell  Patient to take 100mg  metoprolol tartrate two hours prior to her cardiac CT scan. She is aware to arrive at 2:45pm for her  3:15pm scan.

## 2021-06-15 ENCOUNTER — Other Ambulatory Visit: Payer: Medicare Other

## 2021-06-16 ENCOUNTER — Other Ambulatory Visit: Payer: Self-pay | Admitting: Otolaryngology

## 2021-06-16 ENCOUNTER — Other Ambulatory Visit: Payer: Self-pay

## 2021-06-16 ENCOUNTER — Ambulatory Visit (HOSPITAL_COMMUNITY)
Admission: RE | Admit: 2021-06-16 | Discharge: 2021-06-16 | Disposition: A | Discharge status: Home / Self Care | Payer: Medicare Other | Source: Ambulatory Visit | Attending: Cardiology | Admitting: Cardiology

## 2021-06-16 DIAGNOSIS — R072 Precordial pain: Secondary | ICD-10-CM | POA: Diagnosis not present

## 2021-06-16 DIAGNOSIS — K219 Gastro-esophageal reflux disease without esophagitis: Secondary | ICD-10-CM

## 2021-06-16 DIAGNOSIS — R1314 Dysphagia, pharyngoesophageal phase: Secondary | ICD-10-CM

## 2021-06-16 MED ORDER — NITROGLYCERIN 0.4 MG SL SUBL
0.8000 mg | SUBLINGUAL_TABLET | Freq: Once | SUBLINGUAL | Status: AC
  Administered 2021-06-16: 0.4 mg via SUBLINGUAL

## 2021-06-16 MED ORDER — IOHEXOL 350 MG/ML SOLN
95.0000 mL | Freq: Once | INTRAVENOUS | Status: AC | PRN
  Administered 2021-06-16: 95 mL via INTRAVENOUS

## 2021-06-16 MED ORDER — NITROGLYCERIN 0.4 MG SL SUBL
SUBLINGUAL_TABLET | SUBLINGUAL | Status: AC
  Filled 2021-06-16: qty 1

## 2021-06-16 NOTE — Progress Notes (Signed)
°   06/16/21 1506  Vital Signs  Pulse Rate 61  Pulse Rate Source Monitor  BP 92/68  MAP (mmHg) 77  BP Location Left Arm  BP Method Automatic  Patient Position (if appropriate) Sitting   Dr. Alda Lea informed of patient blood pressure 92/68 and patient feels slightly dizzy. Order given for 250cc Normal Saline bolus. If systolic blood pressure over 100 after fluid bolus, continue with CT cardiac exam and administer 1 Nitroglycerin.

## 2021-06-18 ENCOUNTER — Other Ambulatory Visit: Payer: Medicare Other

## 2021-06-18 LAB — AFB CULTURE WITH SMEAR (NOT AT ARMC)
Acid Fast Culture: NEGATIVE
Acid Fast Smear: NEGATIVE

## 2021-06-18 NOTE — Progress Notes (Signed)
AFB negative

## 2021-06-21 ENCOUNTER — Telehealth: Payer: Self-pay | Admitting: Nurse Practitioner

## 2021-06-21 NOTE — Telephone Encounter (Signed)
Currently, yes. She is at slightly higher risk for another pseudomonas infection in the future compared to someone who has not had pseudomonas. I am glad she is doing better! Follow up as scheduled or sooner, if needed. Thanks.

## 2021-06-21 NOTE — Telephone Encounter (Signed)
As discussed with patient, will send her a MyChart message with Katie's response.

## 2021-06-21 NOTE — Telephone Encounter (Signed)
Called patient and left voicemail for her to call office back °

## 2021-06-21 NOTE — Telephone Encounter (Signed)
Called and spoke with patient. She verbalized understanding of results of the AFB culture. She wanted to know if she could classify herself of being pseudomonas free.   Joellen Jersey, can you please advise? Thanks!

## 2021-06-28 ENCOUNTER — Ambulatory Visit
Admission: RE | Admit: 2021-06-28 | Discharge: 2021-06-28 | Disposition: A | Discharge status: Home / Self Care | Payer: Medicare Other | Source: Ambulatory Visit | Attending: Otolaryngology | Admitting: Otolaryngology

## 2021-06-28 DIAGNOSIS — R1310 Dysphagia, unspecified: Secondary | ICD-10-CM | POA: Diagnosis not present

## 2021-06-28 DIAGNOSIS — K219 Gastro-esophageal reflux disease without esophagitis: Secondary | ICD-10-CM

## 2021-06-28 DIAGNOSIS — R490 Dysphonia: Secondary | ICD-10-CM | POA: Diagnosis not present

## 2021-06-28 DIAGNOSIS — R1314 Dysphagia, pharyngoesophageal phase: Secondary | ICD-10-CM

## 2021-07-01 ENCOUNTER — Telehealth: Payer: Self-pay | Admitting: Family Medicine

## 2021-07-01 NOTE — Telephone Encounter (Signed)
Left message for patient to call back and schedule Medicare Annual Wellness Visit (AWV) in office.  ° °If not able to come in office, please offer to do virtually or by telephone.  Left office number and my jabber #[phone]. ° °Due for AWVI ° °Please schedule at anytime with Nurse Health Advisor. °  °

## 2021-08-05 ENCOUNTER — Ambulatory Visit: Payer: Medicare Other | Admitting: Family Medicine

## 2021-08-11 NOTE — Telephone Encounter (Signed)
See encounters from 06/21/21.  ?

## 2021-08-20 DIAGNOSIS — M1611 Unilateral primary osteoarthritis, right hip: Secondary | ICD-10-CM | POA: Diagnosis not present

## 2021-08-20 DIAGNOSIS — M25551 Pain in right hip: Secondary | ICD-10-CM | POA: Diagnosis not present

## 2021-09-22 ENCOUNTER — Telehealth: Payer: Self-pay | Admitting: Family Medicine

## 2021-09-22 NOTE — Telephone Encounter (Signed)
Left message for patient to call back and schedule Medicare Annual Wellness Visit (AWV).   Please offer to do virtually or by telephone.  Left office number and my jabber #[phone].  AWVI eligible as of 09/30/2020  Please schedule at anytime with Nurse Health Advisor.   

## 2021-10-28 ENCOUNTER — Other Ambulatory Visit: Payer: Self-pay | Admitting: Nurse Practitioner

## 2021-10-28 DIAGNOSIS — J209 Acute bronchitis, unspecified: Secondary | ICD-10-CM

## 2021-11-19 ENCOUNTER — Other Ambulatory Visit: Payer: Self-pay | Admitting: Nurse Practitioner

## 2021-11-19 DIAGNOSIS — J310 Chronic rhinitis: Secondary | ICD-10-CM

## 2021-11-19 NOTE — Telephone Encounter (Signed)
Refill was sent to the pharmacy

## 2021-11-29 ENCOUNTER — Encounter: Payer: Self-pay | Admitting: Family Medicine

## 2021-12-02 NOTE — Progress Notes (Deleted)
[place] at [facility] [address] [place], [place] [address] [phone]  Date:  12/06/2021   Name:  Crystal [name]   DOB:  11-27-1975   MRN:  [id number]  PCP:  Darreld Mclean, MD    Chief Complaint: No chief complaint on file.   History of Present Illness:  Crystal [name] is a 46 y.o. very pleasant female patient who presents with the following:  Patient seen today for preoperative visit She plans to have a hip replacement per Dr. Wynelle Link Her most recent visit was in September  History of breast cancer diagnosed 2012, she has since had bilateral mastectomy Chystal also suffers from significant mental illness, depression and occasional suicidally She is currently under care of [facility] and has been more stable recently  Patient Active Problem List   Diagnosis Date Noted   Chronic bronchitis ([place]) 04/29/2021   Pseudomonas infection 04/29/2021   Chronic rhinitis 04/29/2021   Abnormal EKG 04/29/2021   Tobacco abuse 04/29/2021   MDD (major depressive disorder) 08/21/2019   Severe recurrent major depression without psychotic features ([place]) 08/21/2019   Suicide attempt ([place]) 08/21/2019   Osteonecrosis of hip ([place]) 06/13/2018   Tibial plateau fracture, right, closed, initial encounter 11/27/2017   Anorexia nervosa 08/19/2014   Back pain 08/19/2014   Malnutrition, calorie ([place]) 12/03/2013   Migraines 05/21/2013   Breast cancer of upper-outer quadrant of right female breast ([place]) 03/21/2013   Chronic pain 01/09/2013   DVT of upper extremity (deep vein thrombosis) ([place]) 01/02/2013   Arm edema 12/27/2012   Neuropathic pain 12/27/2012   Chronic female pelvic pain 08/21/2012   Fatigue 08/20/2012   Chemotherapy induced cardiomyopathy ([place]) 09/08/2011   Generalized convulsive seizure ([place]) 07/13/2011   Seizure disorder ([place]) 07/13/2011   Migraine 07/13/2011   Anxiety disorder 07/13/2011   Depression  07/13/2011   Hx MRSA infection 04/13/2011    Past Medical History:  Diagnosis Date   Alcohol abuse    Anorexia nervosa    Anxiety    Avascular necrosis of bone of hip ([place])    Bipolar affective disorder, mixed ([place])    Chronic pain    Depression    Dilated cardiomyopathy secondary to drug ([place])    DJD (degenerative joint disease)    "BONE STICKS OUT ON LEFT PART OF LOWER BACK"   DVT of upper extremity (deep vein thrombosis) ([place]) 2014   Edema of upper extremity    Family history of anesthesia complication    MOTHER PONV   GERD (gastroesophageal reflux disease)    OCCASIONALLY TAKE ZANTAC   History of breast cancer DX JUNE 2012 W/ RIGHT BREAST INVASIVE DUCTAL CA  STAGE IIB---  S/P BILATERAL MASTECTOMIES AND RIGHT NODE DISSECTION   ONCOLOGIST-- DR Jana Hakim--  CHEMO ENDED [place] 2012 / RADIATION ENDED MAR 2013--  CURRENT ON TAMOXIFEN AND ZOLADEX   History of idiopathic seizure    DX 2008 --  LAST ONE 2012--  NO ISSUES SINCE AND NO MEDS. -- PT STATES DOCTORS FELT IT WAS STRESS RELATED DUE TO HEALTH ISSUES   Hot flashes due to tamoxifen    Hx MRSA infection 03/28/2011   Skin   Left ovarian cyst    Migraines    Neuropathy    Seizures ([place])    2016 last seizure    Status post chemotherapy    docetaxel/carboplatin/trastuzumab    Past Surgical History:  Procedure Laterality Date   BIOPSY BREAST  10/14/2010   right needle core biopsy   BREAST SURGERY  11/16/2010   bilateral mastectomy+ right axillary node dissection,T1cN1a, Her2+,ERPR+  (right breast invasive ductal carcinoma)   CYSTO WITH HYDRODISTENSION N/A 07/23/2012   Procedure: CYSTOSCOPY/HYDRODISTENSION instillation of marcaine and pyridium;  Surgeon: Reece Packer, MD;  Location: [facility];  Service: Urology;  Laterality: N/A;  INSTILLATION     HARDWARE REMOVAL Right 12/03/2018   Procedure: Right tibia screw removal;  Surgeon: Gaynelle Arabian, MD;  [facility];  Service: Orthopedics;  Laterality: Right;   70mn   I & D EXTREMITY  09/26/2011   Procedure: IRRIGATION AND DEBRIDEMENT EXTREMITY;  Surgeon: KTennis Must MD;  [facility];  Service: Orthopedics;  Laterality: Right;  I&D right hand cat bite wound   ORIF TIBIA PLATEAU Right 11/27/2017   Procedure: OPEN REDUCTION INTERNAL FIXATION (ORIF) TIBIAL PLATEAU FRACTURE;  Surgeon: AGaynelle Arabian MD;  [facility];  Service: Orthopedics;  Laterality: Right;   PORTACATH PLACEMENT  11/16/2010   placement of left subclavian port NOW REMOVED   right knee torn meniscus surgery      TOTAL HIP ARTHROPLASTY Left 06/13/2018   Procedure: TOTAL HIP ARTHROPLASTY ANTERIOR APPROACH;  Surgeon: AGaynelle Arabian MD;  [facility];  Service: Orthopedics;  Laterality: Left;  1021m   TRANSTHORACIC ECHOCARDIOGRAM  05-28-2012   LVF NORMAL/  EF 55-60%   WISDOM TOOTH EXTRACTION      Social History   Tobacco Use   Smoking status: Every Day    Packs/day: 1.00    Years: 20.00    Total pack years: 20.00    Types: Cigarettes   Smokeless tobacco: Never  Vaping Use   Vaping Use: Never used  Substance Use Topics   Alcohol use: Yes    Comment: 3 times a week   Drug use: No    Comment: LAST USED COCAINE AND [place]IJ[place]UANA 2011    Family History  Problem Relation Age of Onset   Cancer Maternal Grandfather    Mental illness Brother    Mental illness Son     Allergies  Allergen Reactions   Thorazine [Chlorpromazine Hcl] Hives   Tramadol Other (See Comments)    Seizures   Phenytoin Nausea And Vomiting   Ciprofloxacin Nausea And Vomiting   Vancomycin Rash and Other (See Comments)    Possible rash reported by MD    Medication list has been reviewed and updated.  Current Outpatient Medications on File Prior to Visit  Medication Sig Dispense Refill   albuterol (VENTOLIN HFA) 108 (90 Base) MCG/ACT inhaler INHALE 2 PUFFS INTO THE LUNGS EVERY 4 HOURS AS NEEDED FOR WHEEZING OR SHORTNESS OF BREATH. 18 each 2   ALPRAZolam (XANAX) 1 MG tablet Take 1 mg by  mouth 4 (four) times daily.     amphetamine-dextroamphetamine (ADDERALL XR) 30 MG 24 hr capsule Take by mouth every morning.     amphetamine-dextroamphetamine (ADDERALL) 10 MG tablet Take 10 mg by mouth daily as needed.     cetirizine (ZYRTEC ALLERGY) 10 MG tablet Take 1 tablet (10 mg total) by mouth daily. (Patient not taking: Reported on 06/04/2021) 30 tablet 3   ciprofloxacin (CIPRO) 750 MG tablet Take 1 tablet (750 mg total) by mouth 2 (two) times daily. (Patient not taking: Reported on 06/04/2021) 14 tablet 0   esomeprazole (NEXIUM) 20 MG capsule Take 20 mg by mouth daily in the afternoon.     Eszopiclone 3 MG TABS Take 3 mg by mouth at bedtime.  Take immediately before bedtime     fluticasone (FLONASE) 50 MCG/ACT nasal spray SPRAY 1 SPRAY INTO BOTH NOSTRILS DAILY. 16 mL 2   gabapentin (NEURONTIN) 600 MG tablet Take 600 mg by mouth. 3 in morning, two tablets at night     ipratropium-albuterol (DUONEB) 0.5-2.5 (3) MG/3ML SOLN Take 3 mLs by nebulization every 6 (six) hours as needed. 360 mL 1   metoprolol tartrate (LOPRESSOR) 100 MG tablet TAKE 2 HOURS PRIOR TO CT SCAN 1 tablet 0   OLANZapine-Samidorphan (LYBALVI) 5-10 MG TABS Take by mouth at bedtime.     predniSONE (DELTASONE) 10 MG tablet 4 tabs for 2 days, then 3 tabs for 2 days, 2 tabs for 2 days, then 1 tab for 2 days, then stop (Patient not taking: Reported on 06/04/2021) 20 tablet 0   traZODone (DESYREL) 50 MG tablet Take 50 mg by mouth at bedtime. (Patient not taking: Reported on 06/04/2021)     No current facility-administered medications on file prior to visit.    Review of Systems:  As per HPI- otherwise negative.   Physical Examination: There were no vitals filed for this visit. There were no vitals filed for this visit. There is no height or weight on file to calculate BMI. Ideal Body Weight:    GEN: no acute distress. HEENT: Atraumatic, Normocephalic.  Ears and Nose: No external deformity. CV: RRR, No M/G/R. No JVD. No  thrill. No extra heart sounds. PULM: CTA B, no wheezes, crackles, rhonchi. No retractions. No resp. distress. No accessory muscle use. ABD: S, NT, ND, +BS. No rebound. No HSM. EXTR: No c/c/e PSYCH: Normally interactive. Conversant.    Assessment and Plan: ***  Signed Lamar Blinks, MD

## 2021-12-06 ENCOUNTER — Ambulatory Visit: Payer: Medicare Other | Admitting: Family Medicine

## 2021-12-12 NOTE — Progress Notes (Deleted)
[place] at [facility] [address], [place], [place] [address] [phone]  Date:  12/13/2021   Name:  Crystal [name]   DOB:  Feb 14, 1976   MRN:  [id number]  PCP:  Darreld Mclean, MD    Chief Complaint: No chief complaint on file.   History of Present Illness:  Crystal [name] is a 46 y.o. very pleasant female patient who presents with the following:  Patient seen today for preoperative evaluation-she plans to have a total hip per Dr. Wynelle Link Most recent visit with myself about 1 year ago History of breast cancer diagnosed 2012, she has since had bilateral mastectomy.  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  Visit with Dr. Stanford Breed in February: 1 chest pain-symptoms are atypical.  I will arrange a cardiac CTA to rule out obstructive coronary disease. 2 dyspnea-likely from history of tobacco use/bronchitis.  Previous echocardiogram showed normal LV function and she is not volume overloaded on examination. 3 history of chemotherapy-induced cardiomyopathy-recent echocardiogram showed normal LV function. 4 abnormal ECG-recent ECG revealed right atrial enlargement though this was not evidence on echocardiogram.  No other structural abnormalities noted on echocardiogram and we will therefore not pursue further evaluation.  Also note follow-up ECG did not show right atrial enlargement  Riot did have a coronary CT in February of this year: IMPRESSION: 1. Coronary calcium score of 0. 2. Normal coronary origin with right dominance. 3. No evidence of CAD. CAD-RADS 0. No evidence of CAD (0%). Consider non-atherosclerotic causes of chest pain.   Laquasha also follows up with pulmonology-she was recently treated with Cipro for a Pseudomonas  infection  We will need to make sure patient is also cleared by pulmonology; will also touch base with her cardiologist  EKG on chart from February of this year, normal Patient Active Problem List   Diagnosis Date Noted   Chronic bronchitis ([place]) 04/29/2021   Pseudomonas infection 04/29/2021   Chronic rhinitis 04/29/2021   Abnormal EKG 04/29/2021   Tobacco abuse 04/29/2021   MDD (major depressive disorder) 08/21/2019   Severe recurrent major depression without psychotic features ([place]) 08/21/2019   Suicide attempt ([place]) 08/21/2019   Osteonecrosis of hip ([place]) 06/13/2018   Tibial plateau fracture, right, closed, initial encounter 11/27/2017   Anorexia nervosa 08/19/2014   Back pain 08/19/2014   Malnutrition, calorie ([place]) 12/03/2013   Migraines 05/21/2013   Breast cancer of upper-outer quadrant of right female breast ([place]) 03/21/2013   Chronic pain 01/09/2013   DVT of upper extremity (deep vein thrombosis) ([place]) 01/02/2013   Arm edema 12/27/2012   Neuropathic pain 12/27/2012   Chronic female pelvic pain 08/21/2012   Fatigue 08/20/2012   Chemotherapy induced cardiomyopathy ([place]) 09/08/2011   Generalized convulsive seizure ([place]) 07/13/2011   Seizure disorder ([place]) 07/13/2011   Migraine 07/13/2011   Anxiety disorder 07/13/2011   Depression 07/13/2011   Hx MRSA infection 04/13/2011    Past Medical History:  Diagnosis Date   Alcohol abuse    Anorexia nervosa    Anxiety    Avascular necrosis of bone of hip (HCC)    Bipolar affective disorder, mixed (HCC)    Chronic pain    Depression    Dilated cardiomyopathy secondary to drug ([place])    DJD (degenerative joint disease)    "BONE  STICKS OUT ON LEFT PART OF LOWER BACK"   DVT of upper extremity (deep vein thrombosis) ([place]) 2014   Edema of upper extremity    Family history of anesthesia complication    MOTHER PONV   GERD (gastroesophageal reflux disease)    OCCASIONALLY TAKE ZANTAC   History of breast cancer DX JUNE 2012 W/  RIGHT BREAST INVASIVE DUCTAL CA  STAGE IIB---  S/P BILATERAL MASTECTOMIES AND RIGHT NODE DISSECTION   ONCOLOGIST-- DR Jana Hakim--  CHEMO ENDED [place] 2012 / RADIATION ENDED MAR 2013--  CURRENT ON TAMOXIFEN AND ZOLADEX   History of idiopathic seizure    DX 2008 --  LAST ONE 2012--  NO ISSUES SINCE AND NO MEDS. -- PT STATES DOCTORS FELT IT WAS STRESS RELATED DUE TO HEALTH ISSUES   Hot flashes due to tamoxifen    Hx MRSA infection 03/28/2011   Skin   Left ovarian cyst    Migraines    Neuropathy    Seizures ([place])    2016 last seizure    Status post chemotherapy    docetaxel/carboplatin/trastuzumab    Past Surgical History:  Procedure Laterality Date   BIOPSY BREAST  10/14/2010   right needle core biopsy   BREAST SURGERY  11/16/2010   bilateral mastectomy+ right axillary node dissection,T1cN1a, Her2+,ERPR+  (right breast invasive ductal carcinoma)   CYSTO WITH HYDRODISTENSION N/A 07/23/2012   Procedure: CYSTOSCOPY/HYDRODISTENSION instillation of marcaine and pyridium;  Surgeon: Reece Packer, MD;  Location: [facility];  Service: Urology;  Laterality: N/A;  INSTILLATION     HARDWARE REMOVAL Right 12/03/2018   Procedure: Right tibia screw removal;  Surgeon: Gaynelle Arabian, MD;  [facility];  Service: Orthopedics;  Laterality: Right;  26mn   I & D EXTREMITY  09/26/2011   Procedure: IRRIGATION AND DEBRIDEMENT EXTREMITY;  Surgeon: KTennis Must MD;  [facility];  Service: Orthopedics;  Laterality: Right;  I&D right hand cat bite wound   ORIF TIBIA PLATEAU Right 11/27/2017   Procedure: OPEN REDUCTION INTERNAL FIXATION (ORIF) TIBIAL PLATEAU FRACTURE;  Surgeon: AGaynelle Arabian MD;  [facility];  Service: Orthopedics;  Laterality: Right;   PORTACATH PLACEMENT  11/16/2010   placement of left subclavian port NOW REMOVED   right knee torn meniscus surgery      TOTAL HIP ARTHROPLASTY Left 06/13/2018   Procedure: TOTAL HIP ARTHROPLASTY ANTERIOR APPROACH;  Surgeon: AGaynelle Arabian MD;  [facility];  Service: Orthopedics;  Laterality: Left;  1024m   TRANSTHORACIC ECHOCARDIOGRAM  05-28-2012   LVF NORMAL/  EF 55-60%   WISDOM TOOTH EXTRACTION      Social History   Tobacco Use   Smoking status: Every Day    Packs/day: 1.00    Years: 20.00    Total pack years: 20.00    Types: Cigarettes   Smokeless tobacco: Never  Vaping Use   Vaping Use: Never used  Substance Use Topics   Alcohol use: Yes    Comment: 3 times a week   Drug use: No    Comment: [place] AND [id number]    Family History  Problem Relation Age of Onset   Cancer Maternal Grandfather    Mental illness Brother    Mental illness Son     Allergies  Allergen Reactions   Thorazine [Chlorpromazine Hcl] Hives   Tramadol Other (See Comments)    Seizures   Phenytoin Nausea And Vomiting   Ciprofloxacin Nausea And Vomiting   Vancomycin Rash and Other (See Comments)  Possible rash reported by MD    Medication list has been reviewed and updated.  Current Outpatient Medications on File Prior to Visit  Medication Sig Dispense Refill   albuterol (VENTOLIN HFA) 108 (90 Base) MCG/ACT inhaler INHALE 2 PUFFS INTO THE LUNGS EVERY 4 HOURS AS NEEDED FOR WHEEZING OR SHORTNESS OF BREATH. 18 each 2   ALPRAZolam (XANAX) 1 MG tablet Take 1 mg by mouth 4 (four) times daily.     amphetamine-dextroamphetamine (ADDERALL XR) 30 MG 24 hr capsule Take by mouth every morning.     amphetamine-dextroamphetamine (ADDERALL) 10 MG tablet Take 10 mg by mouth daily as needed.     cetirizine (ZYRTEC ALLERGY) 10 MG tablet Take 1 tablet (10 mg total) by mouth daily. (Patient not taking: Reported on 06/04/2021) 30 tablet 3   ciprofloxacin (CIPRO) 750 MG tablet Take 1 tablet (750 mg total) by mouth 2 (two) times daily. (Patient not taking: Reported on 06/04/2021) 14 tablet 0   esomeprazole (NEXIUM) 20 MG capsule Take 20 mg by mouth daily in the afternoon.     Eszopiclone 3 MG TABS Take 3 mg by mouth at  bedtime. Take immediately before bedtime     fluticasone (FLONASE) 50 MCG/ACT nasal spray SPRAY 1 SPRAY INTO BOTH NOSTRILS DAILY. 16 mL 2   gabapentin (NEURONTIN) 600 MG tablet Take 600 mg by mouth. 3 in morning, two tablets at night     ipratropium-albuterol (DUONEB) 0.5-2.5 (3) MG/3ML SOLN Take 3 mLs by nebulization every 6 (six) hours as needed. 360 mL 1   metoprolol tartrate (LOPRESSOR) 100 MG tablet TAKE 2 HOURS PRIOR TO CT SCAN 1 tablet 0   OLANZapine-Samidorphan (LYBALVI) 5-10 MG TABS Take by mouth at bedtime.     predniSONE (DELTASONE) 10 MG tablet 4 tabs for 2 days, then 3 tabs for 2 days, 2 tabs for 2 days, then 1 tab for 2 days, then stop (Patient not taking: Reported on 06/04/2021) 20 tablet 0   traZODone (DESYREL) 50 MG tablet Take 50 mg by mouth at bedtime. (Patient not taking: Reported on 06/04/2021)     No current facility-administered medications on file prior to visit.    Review of Systems:  As per HPI- otherwise negative.   Physical Examination: There were no vitals filed for this visit. There were no vitals filed for this visit. There is no height or weight on file to calculate BMI. Ideal Body Weight:    GEN: no acute distress. HEENT: Atraumatic, Normocephalic.  Ears and Nose: No external deformity. CV: RRR, No M/G/R. No JVD. No thrill. No extra heart sounds. PULM: CTA B, no wheezes, crackles, rhonchi. No retractions. No resp. distress. No accessory muscle use. ABD: S, NT, ND, +BS. No rebound. No HSM. EXTR: No c/c/e PSYCH: Normally interactive. Conversant.    Assessment and Plan: ***  Signed Lamar Blinks, MD

## 2021-12-13 ENCOUNTER — Ambulatory Visit: Payer: Medicare Other | Admitting: Family Medicine

## 2021-12-19 NOTE — Progress Notes (Addendum)
[place] at [facility] [place], [place], [place] [address] [phone]  Date:  12/22/2021   Name:  Crystal [name]   DOB:  [birth year]   MRN:  [id number]  PCP:  Darreld Mclean, MD    Chief Complaint: Pre-op Exam (Concerns/ questions: pt says she injured her L foot about 3 weeks ago./)   History of Present Illness:  Crystal [name] is a 46 y.o. very pleasant female patient who presents with the following:  Patient seen today for a preoperative visit She plans to have a RIGHT hip replacement per Dr. Wynelle Link She had her left hip replaced last year, avascular necrosis Most recent visit with myself was about 1 year ago History of breast cancer diagnosed 2012, she has since had bilateral mastectomy Chystal also suffers from significant mental illness, depression and occasional suicidally  She has been seen by cardiology, saw Dr. Stanford Breed most recently in February.  In addition to the note below, he also ordered a CT  1 chest pain-symptoms are atypical.  I will arrange a cardiac CTA to rule out obstructive coronary disease. 2 dyspnea-likely from history of tobacco use/bronchitis.  Previous echocardiogram showed normal LV function and she is not volume overloaded on examination. 3 history of chemotherapy-induced cardiomyopathy-recent echocardiogram showed normal LV function. 4 abnormal ECG-recent ECG revealed right atrial enlargement though this was not evidence on echocardiogram.  No other structural abnormalities noted on echocardiogram and we will therefore not pursue further evaluation.  Also note follow-up ECG did not show right atrial enlargement.coronary calcium, score was 0  Seizure disorder- she has not had any activity in 5 years or so  Pt notes she was sitting on her left foot and it went numb- she stepped down and rolled the ankle This occurred maybe 3 weeks ago  She continues to have some pain in the left lateral foot.   It is improved from previous but still present  Recent physical activity has been limited by her hip pain, but she notes no chest pain or shortness of breath with routine activity She had a coronary calcium score of 0 in February CT chest in October of last year: IMPRESSION: 1. Minimal subpleural radiation fibrosis of the anterior right upper lobe, in keeping with history of breast cancer 2. No significant emphysema or bronchial wall thickening. 3. Very fine centrilobular nodularity, most concentrated in the lung apices, most commonly seen in smoking-related respiratory bronchiolitis. 4. Status post bilateral mastectomy. 5. 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  Negative chest x-ray in December Patient Active Problem List   Diagnosis Date Noted   Chronic bronchitis ([place]) 04/29/2021   Pseudomonas infection 04/29/2021   Chronic rhinitis 04/29/2021   Abnormal EKG 04/29/2021   Tobacco abuse 04/29/2021   MDD (major depressive disorder) 08/21/2019   Severe recurrent major depression without psychotic features ([place]) 08/21/2019   Suicide attempt ([place]) 08/21/2019   Osteonecrosis of hip ([place]) 06/13/2018   Tibial plateau fracture, right, closed, initial encounter 11/27/2017   Anorexia nervosa 08/19/2014   Back pain 08/19/2014   Malnutrition, calorie ([place]) 12/03/2013   Breast cancer of upper-outer quadrant of right female breast ([place]) 03/21/2013   Chronic pain 01/09/2013   DVT of upper extremity (deep vein thrombosis) ([place]) 01/02/2013   Arm edema 12/27/2012   Neuropathic pain 12/27/2012   Chronic female pelvic pain 08/21/2012   Fatigue 08/20/2012   Chemotherapy induced cardiomyopathy ([place]) 09/08/2011  Generalized convulsive seizure ([place]) 07/13/2011   Seizure disorder ([place]) 07/13/2011   Migraine 07/13/2011   Anxiety disorder 07/13/2011   Depression 07/13/2011   Hx MRSA infection 04/13/2011    Past Medical History:  Diagnosis Date    Alcohol abuse    Anorexia nervosa    Anxiety    Avascular necrosis of bone of hip ([place])    Bipolar affective disorder, mixed (HCC)    Chronic pain    Depression    Dilated cardiomyopathy secondary to drug ([place])    DJD (degenerative joint disease)    "BONE STICKS OUT ON LEFT PART OF LOWER BACK"   DVT of upper extremity (deep vein thrombosis) ([place]) 2014   Edema of upper extremity    Family history of anesthesia complication    MOTHER PONV   GERD (gastroesophageal reflux disease)    OCCASIONALLY TAKE ZANTAC   History of breast cancer DX JUNE 2012 W/ RIGHT BREAST INVASIVE DUCTAL CA  STAGE IIB---  S/P BILATERAL MASTECTOMIES AND RIGHT NODE DISSECTION   ONCOLOGIST-- DR Jana Hakim--  CHEMO ENDED [place] 2012 / RADIATION ENDED MAR 2013--  CURRENT ON TAMOXIFEN AND ZOLADEX   History of idiopathic seizure    DX 2008 --  LAST ONE 2012--  NO ISSUES SINCE AND NO MEDS. -- PT STATES DOCTORS FELT IT WAS STRESS RELATED DUE TO HEALTH ISSUES   Hot flashes due to tamoxifen    Hx MRSA infection 03/28/2011   Skin   Left ovarian cyst    Migraines    Neuropathy    Seizures ([place])    2016 last seizure    Status post chemotherapy    docetaxel/carboplatin/trastuzumab    Past Surgical History:  Procedure Laterality Date   BIOPSY BREAST  10/14/2010   right needle core biopsy   BREAST SURGERY  11/16/2010   bilateral mastectomy+ right axillary node dissection,T1cN1a, Her2+,ERPR+  (right breast invasive ductal carcinoma)   CYSTO WITH HYDRODISTENSION N/A 07/23/2012   Procedure: CYSTOSCOPY/HYDRODISTENSION instillation of marcaine and pyridium;  Surgeon: Reece Packer, MD;  Location: [facility];  Service: Urology;  Laterality: N/A;  INSTILLATION     HARDWARE REMOVAL Right 12/03/2018   Procedure: Right tibia screw removal;  Surgeon: Gaynelle Arabian, MD;  [facility];  Service: Orthopedics;  Laterality: Right;  56mn   I & D EXTREMITY  09/26/2011   Procedure: IRRIGATION AND DEBRIDEMENT EXTREMITY;   Surgeon: KTennis Must MD;  [facility];  Service: Orthopedics;  Laterality: Right;  I&D right hand cat bite wound   ORIF TIBIA PLATEAU Right 11/27/2017   Procedure: OPEN REDUCTION INTERNAL FIXATION (ORIF) TIBIAL PLATEAU FRACTURE;  Surgeon: AGaynelle Arabian MD;  [facility];  Service: Orthopedics;  Laterality: Right;   PORTACATH PLACEMENT  11/16/2010   placement of left subclavian port NOW REMOVED   right knee torn meniscus surgery      TOTAL HIP ARTHROPLASTY Left 06/13/2018   Procedure: TOTAL HIP ARTHROPLASTY ANTERIOR APPROACH;  Surgeon: AGaynelle Arabian MD;  [facility];  Service: Orthopedics;  Laterality: Left;  1070m   TRANSTHORACIC ECHOCARDIOGRAM  05-28-2012   LVF NORMAL/  EF 55-60%   WISDOM TOOTH EXTRACTION      Social History   Tobacco Use   Smoking status: Every Day    Packs/day: 1.00    Years: 20.00    Total pack years: 20.00    Types: Cigarettes   Smokeless tobacco: Never  Vaping Use   Vaping Use: Never used  Substance Use Topics  Alcohol use: Yes    Comment: 3 times a week   Drug use: No    Comment: LAST USED COCAINE AND [place] 2011    Family History  Problem Relation Age of Onset   Cancer Maternal Grandfather    Mental illness Brother    Mental illness Son     Allergies  Allergen Reactions   Thorazine [Chlorpromazine Hcl] Hives   Tramadol Other (See Comments)    Seizures   Phenytoin Nausea And Vomiting   Ciprofloxacin Nausea And Vomiting   Vancomycin Rash and Other (See Comments)    Possible rash reported by MD    Medication list has been reviewed and updated.  Current Outpatient Medications on File Prior to Visit  Medication Sig Dispense Refill   albuterol (VENTOLIN HFA) 108 (90 Base) MCG/ACT inhaler INHALE 2 PUFFS INTO THE LUNGS EVERY 4 HOURS AS NEEDED FOR WHEEZING OR SHORTNESS OF BREATH. 18 each 2   ALPRAZolam (XANAX) 1 MG tablet Take 1 mg by mouth 4 (four) times daily.     esomeprazole (NEXIUM) 20 MG capsule Take 20 mg by mouth  daily in the afternoon.     Eszopiclone 3 MG TABS Take 3 mg by mouth at bedtime. Take immediately before bedtime     fluticasone (FLONASE) 50 MCG/ACT nasal spray SPRAY 1 SPRAY INTO BOTH NOSTRILS DAILY. 16 mL 2   ipratropium-albuterol (DUONEB) 0.5-2.5 (3) MG/3ML SOLN Take 3 mLs by nebulization every 6 (six) hours as needed. 360 mL 1   traZODone (DESYREL) 50 MG tablet Take 50 mg by mouth at bedtime.     No current facility-administered medications on file prior to visit.    Review of Systems:  As per HPI- otherwise negative.   Physical Examination: Vitals:   12/22/21 1323  BP: 118/80  Pulse: (!) 103  Resp: 18  Temp: 98 F (36.7 C)  SpO2: 99%   Vitals:   12/22/21 1323  Weight: 138 lb 12.8 oz (63 kg)  Height: '5\' 6"'  (1.676 m)   Body mass index is 22.4 kg/m. Ideal Body Weight: Weight in (lb) to have BMI = 25: 154.6  GEN: no acute distress.  Looks well, normal weight HEENT: Atraumatic, Normocephalic.  Bilateral TM wnl, oropharynx normal.  PEERL,EOMI.   Ears and Nose: No external deformity. CV: RRR, No M/G/R. No JVD. No thrill. No extra heart sounds. PULM: CTA B, no wheezes, crackles, rhonchi. No retractions. No resp. distress. No accessory muscle use. ABD: S, NT, ND, +BS. No rebound. No HSM. EXTR: No c/c/e PSYCH: Normally interactive. Conversant.  There is mild tenderness and minimal bruising over the left lateral foot.  We will obtain plain x-ray today to rule out fracture  Assessment and Plan: Preoperative examination  Left foot pain - Plan: DG Foot Complete Left Patient seen today for preoperative evaluation.  Per surgeon's note, they will take care of labs at a preop anesthesia meeting.  She recently had coronary calcium testing, 0 She was treated by pulmonology last year-persistent bronchitis, culture grew Pseudomonas last November She completed treatment with Cipro and infection is considered to be clear per pulmonology, see phone notes from February.  However, this is  somewhat confusing and I will touch base with her pulmonology provider to make sure nothing further is needed  She also notes that she stepped on her foot wrong about 3 weeks ago, she has tenderness over the fifth metacarpal.  We will obtain a plain x-ray today  Received a message back from her pulmonology provider:  Hi! You as well. I haven't seen her since December. She was supposed to collect sputum culture and AFB because she was having persistent symptoms. Looks like AFB was the only thing that was ever returned and she never followed up. I know she was dealing with a lot in her personal life.  Her imaging cleared and from what I can tell, looks like her symptoms resolved (based on MyChart messages) so would consider her to be recovered. Just at increased risk for pseudomonal infection in the future given her history! Hope that helps. Thanks! Joellen [place]   We will go ahead and clear patient for surgery   Signed Lamar Blinks, MD  Addnd 8/25- received her x-ray report in the am and called pt at 8:30- she does have a foot fracture LEFT FOOT - COMPLETE 3+ VIEW   COMPARISON:  None Available.   FINDINGS: There is a nondisplaced fracture at the base of the fifth metatarsal. No significant displacement. No other fracture of the foot. Normal alignment. There is no evidence of arthropathy or other focal bone abnormality. Soft tissues are unremarkable.   IMPRESSION: Nondisplaced fracture at the base of the fifth metatarsal.   I called [facility] and they are able to see pt today.  However she is not sure if she will go as her husband is not feeling well.  I did advise her that immobilizing her broken bone, likely in a fracture boot, will help her heal with less risk of complication and I would encourage her to get this looked at ASAP She states understanding and will do her best

## 2021-12-22 ENCOUNTER — Ambulatory Visit (INDEPENDENT_AMBULATORY_CARE_PROVIDER_SITE_OTHER): Payer: Medicare Other | Admitting: Family Medicine

## 2021-12-22 ENCOUNTER — Ambulatory Visit (HOSPITAL_BASED_OUTPATIENT_CLINIC_OR_DEPARTMENT_OTHER)
Admission: RE | Admit: 2021-12-22 | Discharge: 2021-12-22 | Disposition: A | Discharge status: Home / Self Care | Payer: Medicare Other | Source: Ambulatory Visit | Attending: Family Medicine | Admitting: Family Medicine

## 2021-12-22 VITALS — BP 118/80 | HR 103 | Temp 98.0°F | Resp 18 | Ht 66.0 in | Wt 138.8 lb

## 2021-12-22 DIAGNOSIS — M79672 Pain in left foot: Secondary | ICD-10-CM

## 2021-12-22 DIAGNOSIS — Z01818 Encounter for other preprocedural examination: Secondary | ICD-10-CM | POA: Diagnosis not present

## 2021-12-22 DIAGNOSIS — S92355A Nondisplaced fracture of fifth metatarsal bone, left foot, initial encounter for closed fracture: Secondary | ICD-10-CM | POA: Diagnosis not present

## 2021-12-23 ENCOUNTER — Encounter: Payer: Self-pay | Admitting: Family Medicine

## 2021-12-24 ENCOUNTER — Encounter: Payer: Self-pay | Admitting: Family Medicine

## 2021-12-28 ENCOUNTER — Telehealth: Payer: Self-pay | Admitting: Family Medicine

## 2021-12-28 NOTE — Telephone Encounter (Signed)
Faxed

## 2021-12-28 NOTE — Telephone Encounter (Signed)
Patient called to provide fax number for Dr. Wandalee Ferdinand office ([facility] [place] location) to fax the xray of her foot to so he can fit her for a boot. Their fax # [phone]

## 2021-12-31 DIAGNOSIS — S92355A Nondisplaced fracture of fifth metatarsal bone, left foot, initial encounter for closed fracture: Secondary | ICD-10-CM | POA: Diagnosis not present

## 2022-01-11 NOTE — H&P (Signed)
TOTAL HIP ADMISSION H&P  Patient is admitted for right total hip arthroplasty.  Subjective:  Chief Complaint: Right hip pain  HPI: Crystal [name], 46 y.o. female, has a history of pain and functional disability in the right hip due to  avascular necrosis  and patient has failed non-surgical conservative treatments for greater than 12 weeks to include NSAID's and/or analgesics and activity modification. Onset of symptoms was abrupt, starting  around a  year ago with gradually worsening course since that time. The patient noted no past surgery on the right hip. Patient currently rates pain in the right hip at 9 out of 10 with activity. Patient has night pain, worsening of pain with activity and weight bearing, pain that interfers with activities of daily living, and pain with passive range of motion. Patient has evidence of  significant osteonecrosis of the right hip. She has subchondral sclerosis and faint crescent sign involving approximately 50 percent of her femoral head  by imaging studies. This condition presents safety issues increasing the risk of falls. There is no current active infection.  Patient Active Problem List   Diagnosis Date Noted   Chronic bronchitis ([place]) 04/29/2021   Pseudomonas infection 04/29/2021   Chronic rhinitis 04/29/2021   Abnormal EKG 04/29/2021   Tobacco abuse 04/29/2021   MDD (major depressive disorder) 08/21/2019   Severe recurrent major depression without psychotic features ([place]) 08/21/2019   Suicide attempt ([place]) 08/21/2019   Osteonecrosis of hip ([place]) 06/13/2018   Tibial plateau fracture, right, closed, initial encounter 11/27/2017   Anorexia nervosa 08/19/2014   Back pain 08/19/2014   Malnutrition, calorie ([place]) 12/03/2013   Breast cancer of upper-outer quadrant of right female breast ([facility]) 03/21/2013   Chronic pain 01/09/2013   DVT of upper extremity (deep vein thrombosis) ([place]) 01/02/2013   Arm edema 12/27/2012   Neuropathic pain 12/27/2012    Chronic female pelvic pain 08/21/2012   Fatigue 08/20/2012   Chemotherapy induced cardiomyopathy ([place]) 09/08/2011   Generalized convulsive seizure ([place]) 07/13/2011   Seizure disorder ([facility]) 07/13/2011   Migraine 07/13/2011   Anxiety disorder 07/13/2011   Depression 07/13/2011   Hx MRSA infection 04/13/2011    Past Medical History:  Diagnosis Date   Alcohol abuse    Anorexia nervosa    Anxiety    Avascular necrosis of bone of hip ([place])    Bipolar affective disorder, mixed ([facility])    Chronic pain    Depression    Dilated cardiomyopathy secondary to drug ([place])    DJD (degenerative joint disease)    "BONE STICKS OUT ON LEFT PART OF LOWER BACK"   DVT of upper extremity (deep vein thrombosis) ([place]) 2014   Edema of upper extremity    Family history of anesthesia complication    MOTHER PONV   GERD (gastroesophageal reflux disease)    OCCASIONALLY TAKE ZANTAC   History of breast cancer DX JUNE 2012 W/ RIGHT BREAST INVASIVE DUCTAL CA  STAGE IIB---  S/P BILATERAL MASTECTOMIES AND RIGHT NODE DISSECTION   ONCOLOGIST-- DR Jana Hakim--  CHEMO ENDED [place] 2012 / RADIATION ENDED MAR 2013--  CURRENT ON TAMOXIFEN AND ZOLADEX   History of idiopathic seizure    DX 2008 --  LAST ONE 2012--  NO ISSUES SINCE AND NO MEDS. -- PT STATES DOCTORS FELT IT WAS STRESS RELATED DUE TO HEALTH ISSUES   Hot flashes due to tamoxifen    Hx MRSA infection 03/28/2011   Skin   Left ovarian cyst    Migraines    Neuropathy  Seizures ([place])    2016 last seizure    Status post chemotherapy    docetaxel/carboplatin/trastuzumab    Past Surgical History:  Procedure Laterality Date   BIOPSY BREAST  10/14/2010   right needle core biopsy   BREAST SURGERY  11/16/2010   bilateral mastectomy+ right axillary node dissection,T1cN1a, Her2+,ERPR+  (right breast invasive ductal carcinoma)   CYSTO WITH HYDRODISTENSION N/A 07/23/2012   Procedure: CYSTOSCOPY/HYDRODISTENSION instillation of marcaine and pyridium;  Surgeon: Reece Packer, MD;  Location: [facility];  Service: Urology;  Laterality: N/A;  INSTILLATION     HARDWARE REMOVAL Right 12/03/2018   Procedure: Right tibia screw removal;  Surgeon: Gaynelle Arabian, MD;  Location: [facility];  Service: Orthopedics;  Laterality: Right;  71mn   I & D EXTREMITY  09/26/2011   Procedure: IRRIGATION AND DEBRIDEMENT EXTREMITY;  Surgeon: KTennis Must MD;  Location: [facility];  Service: Orthopedics;  Laterality: Right;  I&D right hand cat bite wound   ORIF TIBIA PLATEAU Right 11/27/2017   Procedure: OPEN REDUCTION INTERNAL FIXATION (ORIF) TIBIAL PLATEAU FRACTURE;  Surgeon: AGaynelle Arabian MD;  Location: [facility];  Service: Orthopedics;  Laterality: Right;   PORTACATH PLACEMENT  11/16/2010   placement of left subclavian port NOW REMOVED   right knee torn meniscus surgery      TOTAL HIP ARTHROPLASTY Left 06/13/2018   Procedure: TOTAL HIP ARTHROPLASTY ANTERIOR APPROACH;  Surgeon: AGaynelle Arabian MD;  Location: [facility];  Service: Orthopedics;  Laterality: Left;  1073m   TRANSTHORACIC ECHOCARDIOGRAM  05-28-2012   LVF NORMAL/  EF 55-60%   WISDOM TOOTH EXTRACTION      Prior to Admission medications   Medication Sig Start Date End Date Taking? Authorizing Provider  albuterol (VENTOLIN HFA) 108 (90 Base) MCG/ACT inhaler INHALE 2 PUFFS INTO THE LUNGS EVERY 4 HOURS AS NEEDED FOR WHEEZING OR SHORTNESS OF BREATH. 10/28/21   Cobb, KaKarie SchwalbeNP  ALPRAZolam (XDuanne Moron1 MG tablet Take 1 mg by mouth 4 (four) times daily.    [provider]  esomeprazole (NEXIUM) 20 MG capsule Take 20 mg by mouth daily in the afternoon.    [provider]  Eszopiclone 3 MG TABS Take 3 mg by mouth at bedtime. Take immediately before bedtime    [provider]  fluticasone (FLONASE) 50 MCG/ACT nasal spray SPRAY 1 SPRAY INTO BOTH NOSTRILS DAILY. 11/19/21   Cobb, KaKarie SchwalbeNP  ipratropium-albuterol (DUONEB) 0.5-2.5 (3) MG/3ML SOLN Take 3 mLs by nebulization every 6 (six) hours  as needed. 01/07/21   Copland, JeGay FillerMD  traZODone (DESYREL) 50 MG tablet Take 50 mg by mouth at bedtime.    [provider]    Allergies  Allergen Reactions   Thorazine [Chlorpromazine Hcl] Hives   Tramadol Other (See Comments)    Seizures   Phenytoin Nausea And Vomiting   Ciprofloxacin Nausea And Vomiting   Vancomycin Rash and Other (See Comments)    Possible rash reported by MD    Social History   Socioeconomic History   Marital status: Married    Spouse name: CaGlendell Docker Number of children: 1   Years of education: HS   Highest education level: Not on file  Occupational History    Employer: NOT EMPLOYED  Tobacco Use   Smoking status: Every Day    Packs/day: 1.00    Years: 20.00    Total pack years: 20.00    Types: Cigarettes   Smokeless tobacco: Never  Vaping Use  Vaping Use: Never used  Substance and Sexual Activity   Alcohol use: Yes    Comment: 3 times a week   Drug use: No    Comment: LAST USED COCAINE AND [place] 2011   Sexual activity: Yes    Partners: Male    Birth control/protection: Injection    Comment: "zylodex" to "shut down ovaries"  Other Topics Concern   Not on file  Social History Narrative   Married 4 years   1 son age 34   Unemployed      Grandfather died unknown malignacy   No family h/o breast, ovarian or colon ca      Menarche age 45      Patient lives at home with spouse.   Caffeine Use: Up to 1 pot of coffee daily                  Social Determinants of Health   Financial Resource Strain: Not on file  Food Insecurity: Not on file  Transportation Needs: Not on file  Physical Activity: Not on file  Stress: Not on file  Social Connections: Not on file  Intimate Partner Violence: Not on file    Tobacco Use: High Risk (06/04/2021)   Patient History    Smoking Tobacco Use: Every Day    Smokeless Tobacco Use: Never    Passive Exposure: Not on file   Social History   Substance and Sexual Activity  Alcohol  Use Yes   Comment: 3 times a week    Family History  Problem Relation Age of Onset   Cancer Maternal Grandfather    Mental illness Brother    Mental illness Son     Review of Systems  Constitutional:  Negative for chills and fever.  HENT:  Negative for congestion, sore throat and tinnitus.   Eyes:  Negative for double vision, photophobia and pain.  Respiratory:  Negative for cough, shortness of breath and wheezing.   Cardiovascular:  Negative for chest pain, palpitations and orthopnea.  Gastrointestinal:  Negative for heartburn, nausea and vomiting.  Genitourinary:  Negative for dysuria, frequency and urgency.  Musculoskeletal:  Positive for joint pain.  Neurological:  Negative for dizziness, weakness and headaches.     Objective:  Physical Exam: Well nourished and well developed.  General: Alert and oriented x3, cooperative and pleasant, no acute distress.  Head: normocephalic, atraumatic, neck supple.  Eyes: EOMI.  Musculoskeletal:  Right Hip Exam:  The range of motion: Flexion to 110 degrees, Internal Rotation to 10 degrees, External Rotation to 30 degrees, and abduction to 30 degrees with pain on range of motion.  There is no tenderness over the greater trochanteric bursa.  Calves soft and nontender. Motor function intact in LE. Strength 5/5 LE bilaterally. Neuro: Distal pulses 2+. Sensation to light touch intact in LE.  Imaging Review Plain radiographs demonstrate severe degenerative joint disease of the right hip. The bone quality appears to be adequate for age and reported activity level.  Assessment/Plan:  End stage arthritis, right hip  The patient history, physical examination, clinical judgement of the provider and imaging studies are consistent with end stage degenerative joint disease of the right hip and total hip arthroplasty is deemed medically necessary. The treatment options including medical management, injection therapy, arthroscopy and arthroplasty  were discussed at length. The risks and benefits of total hip arthroplasty were presented and reviewed. The risks due to aseptic loosening, infection, stiffness, dislocation/subluxation, thromboembolic complications and other imponderables were discussed. The patient  acknowledged the explanation, agreed to proceed with the plan and consent was signed. Patient is being admitted for inpatient treatment for surgery, pain control, PT, OT, prophylactic antibiotics, VTE prophylaxis, progressive ambulation and ADLs and discharge planning.The patient is planning to be discharged  home .   Patient's anticipated LOS is less than 2 midnights, meeting these requirements: - Younger than 53 - Lives within 1 hour of care - Has a competent adult at home to recover with post-op recover - NO history of  - Chronic pain requiring opioids  - Diabetes  - Coronary Artery Disease  - Heart failure  - Heart attack  - Stroke  - DVT/VTE  - Cardiac arrhythmia  - Respiratory Failure/COPD  - Renal failure  - Anemia  - Advanced Liver disease  Therapy Plans: HEP Disposition: Home with husband Planned DVT Prophylaxis: Xarelto 10 mg QD DME Needed: None PCP: Lamar Blinks, MD (clearance received) TXA: IV Allergies: Phenytoin (N/V), tramadol, vancomycin (rash) Anesthesia Concerns: None BMI: 24 Last HgbA1c: Not diabetic.  Ph[facility]: [facility]  Other: - Sustained 5th metatarsal fracture left foot on 8/4. Being followed by Dr. Kathaleen Bury.  - Hx breast CA, right arm preferred for BP or IV sticks - Had issues with pain control following left THA 4 years ago  - Patient was instructed on what medications to stop prior to surgery. - Follow-up visit in 2 weeks with Dr. Wynelle Link - Begin physical therapy following surgery - Pre-operative lab work as pre-surgical testing - Prescriptions will be provided in hospital at time of discharge  Theresa Duty, PA-C Orthopedic Surgery [facility] [place]

## 2022-01-14 NOTE — Patient Instructions (Signed)
SURGICAL WAITING ROOM VISITATION Patients having surgery or a procedure may have no more than 2 support people in the waiting area - these visitors may rotate.   Children under the age of 74 must have an adult with them who is not the patient. If the patient needs to stay at the hospital during part of their recovery, the visitor guidelines for inpatient rooms apply. Pre-op nurse will coordinate an appropriate time for 1 support person to accompany patient in pre-op.  This support person may not rotate.    Please refer to the [facility] website for the visitor guidelines for Inpatients (after your surgery is over and you are in a regular room).      Your procedure is scheduled on: 02-02-22   Report to [facility] Main Entrance    Report to admitting at 9:40  AM   Call this number if you have problems the morning of surgery [phone]   Do not eat food :After Midnight.   After Midnight you may have the following liquids until 9:00 AM DAY OF SURGERY  Water Non-Citrus Juices (without pulp, NO RED) Carbonated Beverages Black Coffee (NO MILK/CREAM OR CREAMERS, sugar ok)  Clear Tea (NO MILK/CREAM OR CREAMERS, sugar ok) regular and decaf                             Plain Jell-O (NO RED)                                           Fruit ices (not with fruit pulp, NO RED)                                     Popsicles (NO RED)                                                               Sports drinks like Gatorade (NO RED)                   The day of surgery:  Drink ONE (1) Pre-Surgery Clear Ensure at 9:00 AM the morning of surgery. Drink in one sitting. Do not sip.  This drink was given to you during your hospital  pre-op appointment visit. Nothing else to drink after completing the Pre-Surgery Clear Ensure         If you have questions, please contact your surgeon's office.   FOLLOW ANY ADDITIONAL PRE OP INSTRUCTIONS YOU RECEIVED FROM YOUR SURGEON'S OFFICE!!!     Oral  Hygiene is also important to reduce your risk of infection.                                    Remember - BRUSH YOUR TEETH THE MORNING OF SURGERY WITH YOUR REGULAR TOOTHPASTE   Do NOT smoke after Midnight   Take these medicines the morning of surgery with A SIP OF WATER:   DO NOT TAKE ANY ORAL DIABETIC MEDICATIONS DAY OF YOUR SURGERY  Bring CPAP mask and tubing day of surgery.                              You may not have any metal on your body including hair pins, jewelry, and body piercing             Do not wear make-up, lotions, powders, perfumes/ or deodorant  Do not wear nail polish including gel and S&S, artificial/acrylic nails, or any other type of covering on natural nails including finger and toenails. If you have artificial nails, gel coating, etc. that needs to be removed by a nail salon please have this removed prior to surgery or surgery may need to be canceled/ delayed if the surgeon/ anesthesia feels like they are unable to be safely monitored.   Do not shave  48 hours prior to surgery.    Do not bring valuables to the hospital. [place].   Contacts, dentures or bridgework may not be worn into surgery.   Bring small overnight bag day of surgery.   DO NOT [place]. PHARMACY WILL DISPENSE MEDICATIONS LISTED ON YOUR MEDICATION LIST TO YOU DURING YOUR ADMISSION [place]!  Special Instructions: Bring a copy of your healthcare power of attorney and living will documents the day of surgery if you haven't scanned them before.  Please read over the following fact sheets you were given: IF YOU HAVE QUESTIONS ABOUT YOUR PRE-OP INSTRUCTIONS PLEASE CALL [facility] - Preparing for Surgery Before surgery, you can play an important role.  Because skin is not sterile, your skin needs to be as free of germs as possible.  You can reduce the number of germs on your skin by washing with  CHG (chlorahexidine gluconate) soap before surgery.  CHG is an antiseptic cleaner which kills germs and bonds with the skin to continue killing germs even after washing. Please DO NOT use if you have an allergy to CHG or antibacterial soaps.  If your skin becomes reddened/irritated stop using the CHG and inform your nurse when you arrive at Short Stay. Do not shave (including legs and underarms) for at least 48 hours prior to the first CHG shower.  You may shave your face/neck.  Please follow these instructions carefully:  1.  Shower with CHG Soap the night before surgery and the  morning of surgery.  2.  If you choose to wash your hair, wash your hair first as usual with your normal  shampoo.  3.  After you shampoo, rinse your hair and body thoroughly to remove the shampoo.                             4.  Use CHG as you would any other liquid soap.  You can apply chg directly to the skin and wash.  Gently with a scrungie or clean washcloth.  5.  Apply the CHG Soap to your body ONLY FROM THE NECK DOWN.   Do   not use on face/ open                           Wound or open sores. Avoid contact with eyes, ears mouth and   genitals (private parts).  Wash face,  Genitals (private parts) with your normal soap.             6.  Wash thoroughly, paying special attention to the area where your    surgery  will be performed.  7.  Thoroughly rinse your body with warm water from the neck down.  8.  DO NOT shower/wash with your normal soap after using and rinsing off the CHG Soap.                9.  Pat yourself dry with a clean towel.            10.  Wear clean pajamas.            11.  Place clean sheets on your bed the night of your first shower and do not  sleep with pets. Day of Surgery : Do not apply any lotions/deodorants the morning of surgery.  Please wear clean clothes to the hospital/surgery center.  FAILURE TO FOLLOW THESE INSTRUCTIONS MAY RESULT IN THE CANCELLATION OF YOUR  SURGERY  PATIENT SIGNATURE_________________________________  NURSE SIGNATURE__________________________________  ________________________________________________________________________      [name]  An incentive spirometer is a tool that can help keep your lungs clear and active. This tool measures how well you are filling your lungs with each breath. Taking long deep breaths may help reverse or decrease the chance of developing breathing (pulmonary) problems (especially infection) following: A long period of time when you are unable to move or be active. BEFORE THE PROCEDURE  If the spirometer includes an indicator to show your best effort, your nurse or respiratory therapist will set it to a desired goal. If possible, sit up straight or lean slightly forward. Try not to slouch. Hold the incentive spirometer in an upright position. INSTRUCTIONS FOR USE  Sit on the edge of your bed if possible, or sit up as far as you can in bed or on a chair. Hold the incentive spirometer in an upright position. Breathe out normally. Place the mouthpiece in your mouth and seal your lips tightly around it. Breathe in slowly and as deeply as possible, raising the piston or the ball toward the top of the column. Hold your breath for 3-5 seconds or for as long as possible. Allow the piston or ball to fall to the bottom of the column. Remove the mouthpiece from your mouth and breathe out normally. Rest for a few seconds and repeat Steps 1 through 7 at least 10 times every 1-2 hours when you are awake. Take your time and take a few normal breaths between deep breaths. The spirometer may include an indicator to show your best effort. Use the indicator as a goal to work toward during each repetition. After each set of 10 deep breaths, practice coughing to be sure your lungs are clear. If you have an incision (the cut made at the time of surgery), support your incision when coughing by placing a  pillow or rolled up towels firmly against it. Once you are able to get out of bed, walk around indoors and cough well. You may stop using the incentive spirometer when instructed by your caregiver.  RISKS AND COMPLICATIONS Take your time so you do not get dizzy or light-headed. If you are in pain, you may need to take or ask for pain medication before doing incentive spirometry. It is harder to take a deep breath if you are having pain. AFTER USE Rest and breathe slowly and easily. It  can be helpful to keep track of a log of your progress. Your caregiver can provide you with a simple table to help with this. If you are using the spirometer at home, follow these instructions: [place] IF:  You are having difficultly using the spirometer. You have trouble using the spirometer as often as instructed. Your pain medication is not giving enough relief while using the spirometer. You develop fever of 100.5 F (38.1 C) or higher. SEEK IMMEDIATE MEDICAL CARE IF:  You cough up bloody sputum that had not been present before. You develop fever of 102 F (38.9 C) or greater. You develop worsening pain at or near the incision site. MAKE SURE YOU:  Understand these instructions. Will watch your condition. Will get help right away if you are not doing well or get worse. Document Released: 08/29/2006 Document Revised: 07/11/2011 Document Reviewed: 10/30/2006 [employer] Patient Information 2014 [employer], [place].   ________________________________________________________________________  WHAT IS A BLOOD TRANSFUSION? Blood Transfusion Information  A transfusion is the replacement of blood or some of its parts. Blood is made up of multiple cells which provide different functions. Red blood cells carry oxygen and are used for blood loss replacement. White blood cells fight against infection. Platelets control bleeding. Plasma helps clot blood. Other blood products are available for specialized  needs, such as hemophilia or other clotting disorders. BEFORE THE TRANSFUSION  Who gives blood for transfusions?  Healthy volunteers who are fully evaluated to make sure their blood is safe. This is blood bank blood. Transfusion therapy is the safest it has ever been in the practice of medicine. Before blood is taken from a donor, a complete history is taken to make sure that person has no history of diseases nor engages in risky social behavior (examples are intravenous drug use or sexual activity with multiple partners). The donor's travel history is screened to minimize risk of transmitting infections, such as malaria. The donated blood is tested for signs of infectious diseases, such as HIV and hepatitis. The blood is then tested to be sure it is compatible with you in order to minimize the chance of a transfusion reaction. If you or a relative donates blood, this is often done in anticipation of surgery and is not appropriate for emergency situations. It takes many days to process the donated blood. RISKS AND COMPLICATIONS Although transfusion therapy is very safe and saves many lives, the main dangers of transfusion include:  Getting an infectious disease. Developing a transfusion reaction. This is an allergic reaction to something in the blood you were given. Every precaution is taken to prevent this. The decision to have a blood transfusion has been considered carefully by your caregiver before blood is given. Blood is not given unless the benefits outweigh the risks. AFTER THE TRANSFUSION Right after receiving a blood transfusion, you will usually feel much better and more energetic. This is especially true if your red blood cells have gotten low (anemic). The transfusion raises the level of the red blood cells which carry oxygen, and this usually causes an energy increase. The nurse administering the transfusion will monitor you carefully for complications. HOME CARE INSTRUCTIONS  No special  instructions are needed after a transfusion. You may find your energy is better. Speak with your caregiver about any limitations on activity for underlying diseases you may have. SEEK MEDICAL CARE IF:  Your condition is not improving after your transfusion. You develop redness or irritation at the intravenous (IV) site. SEEK IMMEDIATE MEDICAL CARE IF:  Any of the following symptoms occur over the next 12 hours: Shaking chills. You have a temperature by mouth above 102 F (38.9 C), not controlled by medicine. Chest, back, or muscle pain. People around you feel you are not acting correctly or are confused. Shortness of breath or difficulty breathing. Dizziness and fainting. You get a rash or develop hives. You have a decrease in urine output. Your urine turns a dark color or changes to pink, red, or brown. Any of the following symptoms occur over the next 10 days: You have a temperature by mouth above 102 F (38.9 C), not controlled by medicine. Shortness of breath. Weakness after normal activity. The white part of the eye turns yellow (jaundice). You have a decrease in the amount of urine or are urinating less often. Your urine turns a dark color or changes to pink, red, or brown. Document Released: 04/15/2000 Document Revised: 07/11/2011 Document Reviewed: 12/03/2007 [facility] Patient Information 2014 [place], [place].  _______________________________________________________________________

## 2022-01-19 NOTE — Progress Notes (Addendum)
COVID Vaccine Completed: Yes  Date of COVID positive in last 90 days:  PCP - Lamar Blinks, MD Cardiologist - Kirk Ruths, MD [place], [place]  Chest x-ray - 04-29-21 Epic EKG - 06-04-21 Epic Stress Test -  ECHO - 02-04-21 Epic Cardiac Cath -  Pacemaker/ICD device last checked: Spinal Cord Stimulator: Coronary CT - 06-16-21 Epic  Bowel Prep -   Sleep Study -  CPAP -   Fasting Blood Sugar -  Checks Blood Sugar _____ times a day  Blood Thinner Instructions: Aspirin Instructions: Last Dose:  Activity level:  Can go up a flight of stairs and perform activities of daily living without stopping and without symptoms of chest pain or shortness of breath.  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 Patient was also instructed that they will need to review over the PAT instructions again at home before surgery.

## 2022-01-20 ENCOUNTER — Encounter (HOSPITAL_COMMUNITY): Payer: Self-pay

## 2022-01-20 ENCOUNTER — Encounter (HOSPITAL_COMMUNITY)
Admission: RE | Admit: 2022-01-20 | Discharge: 2022-01-20 | Disposition: A | Discharge status: Home / Self Care | Payer: Medicare Other | Source: Ambulatory Visit | Attending: Orthopedic Surgery | Admitting: Orthopedic Surgery

## 2022-01-20 ENCOUNTER — Other Ambulatory Visit: Payer: Self-pay

## 2022-01-20 VITALS — BP 132/92 | HR 87 | Temp 98.4°F | Resp 16 | Ht 64.0 in | Wt 136.4 lb

## 2022-01-20 DIAGNOSIS — Z01812 Encounter for preprocedural laboratory examination: Secondary | ICD-10-CM | POA: Diagnosis not present

## 2022-01-20 DIAGNOSIS — Z789 Other specified health status: Secondary | ICD-10-CM | POA: Diagnosis not present

## 2022-01-20 DIAGNOSIS — Z79899 Other long term (current) drug therapy: Secondary | ICD-10-CM | POA: Insufficient documentation

## 2022-01-20 DIAGNOSIS — F1090 Alcohol use, unspecified, uncomplicated: Secondary | ICD-10-CM

## 2022-01-20 DIAGNOSIS — Z01818 Encounter for other preprocedural examination: Secondary | ICD-10-CM

## 2022-01-20 HISTORY — DX: Pneumonia, unspecified organism: J18.9

## 2022-01-20 LAB — COMPREHENSIVE METABOLIC PANEL
ALT: 17 U/L (ref 0–44)
AST: 20 U/L (ref 15–41)
Albumin: 4.2 g/dL (ref 3.5–5.0)
Alkaline Phosphatase: 60 U/L (ref 38–126)
Anion gap: 6 (ref 5–15)
BUN: 10 mg/dL (ref 6–20)
CO2: 23 mmol/L (ref 22–32)
Calcium: 9.1 mg/dL (ref 8.9–10.3)
Chloride: 110 mmol/L (ref 98–111)
Creatinine, Ser: 0.79 mg/dL (ref 0.44–1.00)
GFR, Estimated: >60 mL/min (ref >60)
Glucose, Bld: 95 mg/dL (ref 70–99)
Potassium: 4.1 mmol/L (ref 3.5–5.1)
Sodium: 139 mmol/L (ref 135–145)
Total Bilirubin: 0.5 mg/dL (ref 0.3–1.2)
Total Protein: 7.1 g/dL (ref 6.5–8.1)

## 2022-01-20 LAB — CBC
HCT: 44.1 % (ref 36.0–46.0)
Hemoglobin: 15.6 g/dL — ABNORMAL HIGH (ref 12.0–15.0)
MCH: 33.7 pg (ref 26.0–34.0)
MCHC: 35.4 g/dL (ref 30.0–36.0)
MCV: 95.2 fL (ref 80.0–100.0)
Platelets: 252 10*3/uL (ref 150–400)
RBC: 4.63 MIL/uL (ref 3.87–5.11)
RDW: 12.4 % (ref 11.5–15.5)
WBC: 7 10*3/uL (ref 4.0–10.5)
nRBC: 0 % (ref 0.0–0.2)

## 2022-01-20 LAB — SURGICAL PCR SCREEN
MRSA, PCR: NEGATIVE
Staphylococcus aureus: NEGATIVE

## 2022-01-28 DIAGNOSIS — S92355A Nondisplaced fracture of fifth metatarsal bone, left foot, initial encounter for closed fracture: Secondary | ICD-10-CM | POA: Diagnosis not present

## 2022-02-02 ENCOUNTER — Other Ambulatory Visit: Payer: Self-pay

## 2022-02-02 ENCOUNTER — Ambulatory Visit (HOSPITAL_BASED_OUTPATIENT_CLINIC_OR_DEPARTMENT_OTHER): Payer: Medicare Other | Admitting: Certified Registered"

## 2022-02-02 ENCOUNTER — Encounter (HOSPITAL_COMMUNITY): Payer: Self-pay | Admitting: Orthopedic Surgery

## 2022-02-02 ENCOUNTER — Ambulatory Visit (HOSPITAL_COMMUNITY): Payer: Medicare Other

## 2022-02-02 ENCOUNTER — Observation Stay (HOSPITAL_COMMUNITY): Payer: Medicare Other

## 2022-02-02 ENCOUNTER — Encounter (HOSPITAL_COMMUNITY)
Admission: RE | Disposition: A | Discharge status: Home / Self Care | Payer: Self-pay | Source: Ambulatory Visit | Attending: Orthopedic Surgery

## 2022-02-02 ENCOUNTER — Observation Stay (HOSPITAL_COMMUNITY)
Admission: RE | Admit: 2022-02-02 | Discharge: 2022-02-03 | Disposition: A | Discharge status: Home / Self Care | Payer: Medicare Other | Source: Ambulatory Visit | Attending: Orthopedic Surgery | Admitting: Orthopedic Surgery

## 2022-02-02 ENCOUNTER — Ambulatory Visit (HOSPITAL_COMMUNITY): Payer: Medicare Other | Admitting: Physician Assistant

## 2022-02-02 DIAGNOSIS — Z471 Aftercare following joint replacement surgery: Secondary | ICD-10-CM | POA: Diagnosis not present

## 2022-02-02 DIAGNOSIS — Z96643 Presence of artificial hip joint, bilateral: Secondary | ICD-10-CM | POA: Diagnosis not present

## 2022-02-02 DIAGNOSIS — Z86718 Personal history of other venous thrombosis and embolism: Secondary | ICD-10-CM | POA: Insufficient documentation

## 2022-02-02 DIAGNOSIS — Z96642 Presence of left artificial hip joint: Secondary | ICD-10-CM | POA: Diagnosis not present

## 2022-02-02 DIAGNOSIS — M879 Osteonecrosis, unspecified: Secondary | ICD-10-CM | POA: Diagnosis present

## 2022-02-02 DIAGNOSIS — M87851 Other osteonecrosis, right femur: Secondary | ICD-10-CM | POA: Diagnosis not present

## 2022-02-02 DIAGNOSIS — Z853 Personal history of malignant neoplasm of breast: Secondary | ICD-10-CM | POA: Insufficient documentation

## 2022-02-02 DIAGNOSIS — F17210 Nicotine dependence, cigarettes, uncomplicated: Secondary | ICD-10-CM | POA: Diagnosis not present

## 2022-02-02 DIAGNOSIS — Z96641 Presence of right artificial hip joint: Secondary | ICD-10-CM | POA: Diagnosis not present

## 2022-02-02 DIAGNOSIS — Z79899 Other long term (current) drug therapy: Secondary | ICD-10-CM | POA: Diagnosis not present

## 2022-02-02 DIAGNOSIS — M1611 Unilateral primary osteoarthritis, right hip: Principal | ICD-10-CM | POA: Insufficient documentation

## 2022-02-02 HISTORY — PX: TOTAL HIP ARTHROPLASTY: SHX124

## 2022-02-02 LAB — TYPE AND SCREEN
ABO/RH(D): O POS
Antibody Screen: NEGATIVE

## 2022-02-02 LAB — POCT PREGNANCY, URINE: Preg Test, Ur: NEGATIVE

## 2022-02-02 SURGERY — ARTHROPLASTY, HIP, TOTAL, ANTERIOR APPROACH
Anesthesia: Monitor Anesthesia Care | Site: Hip | Laterality: Right

## 2022-02-02 MED ORDER — OXYCODONE HCL 5 MG/5ML PO SOLN: 5.0000 mg | Freq: Once | ORAL | Status: DC | PRN

## 2022-02-02 MED ORDER — MENTHOL 3 MG MT LOZG: 1.0000 | LOZENGE | OROMUCOSAL | Status: DC | PRN

## 2022-02-02 MED ORDER — PROPOFOL 10 MG/ML IV BOLUS
INTRAVENOUS | Status: AC
  Filled 2022-02-02: qty 20

## 2022-02-02 MED ORDER — DEXAMETHASONE SODIUM PHOSPHATE 10 MG/ML IJ SOLN
8.0000 mg | Freq: Once | INTRAMUSCULAR | Status: AC
  Administered 2022-02-02: 8 mg via INTRAVENOUS

## 2022-02-02 MED ORDER — METHOCARBAMOL 500 MG IVPB - SIMPLE MED
500.0000 mg | Freq: Four times a day (QID) | INTRAVENOUS | Status: DC | PRN
  Administered 2022-02-02: 500 mg via INTRAVENOUS

## 2022-02-02 MED ORDER — ONDANSETRON HCL 4 MG PO TABS: 4.0000 mg | ORAL_TABLET | Freq: Four times a day (QID) | ORAL | Status: DC | PRN

## 2022-02-02 MED ORDER — GABAPENTIN 400 MG PO CAPS
1800.0000 mg | ORAL_CAPSULE | Freq: Every day | ORAL | Status: DC
  Administered 2022-02-02: 1800 mg via ORAL
  Filled 2022-02-02 (×2): qty 2

## 2022-02-02 MED ORDER — ORAL CARE MOUTH RINSE: 15.0000 mL | Freq: Once | OROMUCOSAL | Status: AC

## 2022-02-02 MED ORDER — ALPRAZOLAM 1 MG PO TABS
1.0000 mg | ORAL_TABLET | Freq: Four times a day (QID) | ORAL | Status: DC
  Administered 2022-02-02 – 2022-02-03 (×3): 1 mg via ORAL
  Filled 2022-02-02 (×3): qty 1

## 2022-02-02 MED ORDER — ONDANSETRON HCL 4 MG/2ML IJ SOLN: 4.0000 mg | Freq: Four times a day (QID) | INTRAMUSCULAR | Status: DC | PRN

## 2022-02-02 MED ORDER — IPRATROPIUM-ALBUTEROL 0.5-2.5 (3) MG/3ML IN SOLN: 3.0000 mL | Freq: Four times a day (QID) | RESPIRATORY_TRACT | Status: DC | PRN

## 2022-02-02 MED ORDER — PANTOPRAZOLE SODIUM 40 MG PO TBEC: 40.0000 mg | DELAYED_RELEASE_TABLET | Freq: Every day | ORAL | Status: DC | PRN

## 2022-02-02 MED ORDER — ONDANSETRON HCL 4 MG/2ML IJ SOLN
INTRAMUSCULAR | Status: DC | PRN
  Administered 2022-02-02: 4 mg via INTRAVENOUS

## 2022-02-02 MED ORDER — POVIDONE-IODINE 10 % EX SWAB
2.0000 | Freq: Once | CUTANEOUS | Status: AC
  Administered 2022-02-02: 2 via TOPICAL

## 2022-02-02 MED ORDER — TRANEXAMIC ACID-NACL 1000-0.7 MG/100ML-% IV SOLN
1000.0000 mg | INTRAVENOUS | Status: AC
  Administered 2022-02-02: 1000 mg via INTRAVENOUS
  Filled 2022-02-02: qty 100

## 2022-02-02 MED ORDER — METOCLOPRAMIDE HCL 5 MG/ML IJ SOLN
INTRAMUSCULAR | Status: AC
  Filled 2022-02-02: qty 2

## 2022-02-02 MED ORDER — RIVAROXABAN 10 MG PO TABS
10.0000 mg | ORAL_TABLET | Freq: Every day | ORAL | Status: DC
  Administered 2022-02-03: 10 mg via ORAL
  Filled 2022-02-02: qty 1

## 2022-02-02 MED ORDER — ONDANSETRON HCL 4 MG/2ML IJ SOLN
INTRAMUSCULAR | Status: AC
  Filled 2022-02-02: qty 2

## 2022-02-02 MED ORDER — ACETAMINOPHEN 325 MG PO TABS: 325.0000 mg | ORAL_TABLET | Freq: Four times a day (QID) | ORAL | Status: DC | PRN

## 2022-02-02 MED ORDER — LACTATED RINGERS IV SOLN: INTRAVENOUS | Status: DC

## 2022-02-02 MED ORDER — OXYCODONE HCL 5 MG PO TABS
5.0000 mg | ORAL_TABLET | ORAL | Status: DC | PRN
  Administered 2022-02-02 – 2022-02-03 (×4): 10 mg via ORAL
  Filled 2022-02-02 (×3): qty 2

## 2022-02-02 MED ORDER — DEXAMETHASONE SODIUM PHOSPHATE 10 MG/ML IJ SOLN
INTRAMUSCULAR | Status: AC
  Filled 2022-02-02: qty 1

## 2022-02-02 MED ORDER — METOCLOPRAMIDE HCL 5 MG/ML IJ SOLN
5.0000 mg | Freq: Three times a day (TID) | INTRAMUSCULAR | Status: DC | PRN
  Administered 2022-02-02: 10 mg via INTRAVENOUS

## 2022-02-02 MED ORDER — GABAPENTIN 600 MG PO TABS
1200.0000 mg | ORAL_TABLET | Freq: Every day | ORAL | Status: DC
  Filled 2022-02-02: qty 2

## 2022-02-02 MED ORDER — DEXAMETHASONE SODIUM PHOSPHATE 10 MG/ML IJ SOLN
10.0000 mg | Freq: Once | INTRAMUSCULAR | Status: AC
  Administered 2022-02-03: 10 mg via INTRAVENOUS
  Filled 2022-02-02: qty 1

## 2022-02-02 MED ORDER — 0.9 % SODIUM CHLORIDE (POUR BTL) OPTIME
TOPICAL | Status: DC | PRN
  Administered 2022-02-02: 1000 mL

## 2022-02-02 MED ORDER — METHOCARBAMOL 500 MG IVPB - SIMPLE MED
INTRAVENOUS | Status: AC
  Filled 2022-02-02: qty 55

## 2022-02-02 MED ORDER — CHLORHEXIDINE GLUCONATE 0.12 % MT SOLN
15.0000 mL | Freq: Once | OROMUCOSAL | Status: AC
  Administered 2022-02-02: 15 mL via OROMUCOSAL

## 2022-02-02 MED ORDER — TRAMADOL HCL 50 MG PO TABS
50.0000 mg | ORAL_TABLET | Freq: Four times a day (QID) | ORAL | Status: DC | PRN
  Filled 2022-02-02 (×2): qty 2

## 2022-02-02 MED ORDER — MIDAZOLAM HCL 2 MG/2ML IJ SOLN
INTRAMUSCULAR | Status: DC | PRN
  Administered 2022-02-02: 2 mg via INTRAVENOUS

## 2022-02-02 MED ORDER — ONDANSETRON HCL 4 MG/2ML IJ SOLN
4.0000 mg | Freq: Four times a day (QID) | INTRAMUSCULAR | Status: AC | PRN
  Administered 2022-02-02: 4 mg via INTRAVENOUS

## 2022-02-02 MED ORDER — ACETAMINOPHEN 10 MG/ML IV SOLN
1000.0000 mg | Freq: Once | INTRAVENOUS | Status: AC
  Administered 2022-02-02: 1000 mg via INTRAVENOUS
  Filled 2022-02-02: qty 100

## 2022-02-02 MED ORDER — PROPOFOL 500 MG/50ML IV EMUL
INTRAVENOUS | Status: DC | PRN
  Administered 2022-02-02: 125 ug/kg/min via INTRAVENOUS

## 2022-02-02 MED ORDER — ALBUTEROL SULFATE (2.5 MG/3ML) 0.083% IN NEBU: 2.5000 mg | INHALATION_SOLUTION | RESPIRATORY_TRACT | Status: DC | PRN

## 2022-02-02 MED ORDER — SODIUM CHLORIDE 0.9 % IV SOLN: INTRAVENOUS | Status: DC

## 2022-02-02 MED ORDER — MORPHINE SULFATE (PF) 2 MG/ML IV SOLN
0.5000 mg | INTRAVENOUS | Status: DC | PRN
  Administered 2022-02-02 – 2022-02-03 (×3): 1 mg via INTRAVENOUS
  Filled 2022-02-02 (×3): qty 1

## 2022-02-02 MED ORDER — MAGNESIUM CITRATE PO SOLN: 1.0000 | Freq: Once | ORAL | Status: DC | PRN

## 2022-02-02 MED ORDER — DOCUSATE SODIUM 100 MG PO CAPS
100.0000 mg | ORAL_CAPSULE | Freq: Two times a day (BID) | ORAL | Status: DC
  Administered 2022-02-02 – 2022-02-03 (×2): 100 mg via ORAL
  Filled 2022-02-02 (×2): qty 1

## 2022-02-02 MED ORDER — PROPOFOL 1000 MG/100ML IV EMUL
INTRAVENOUS | Status: AC
  Filled 2022-02-02: qty 100

## 2022-02-02 MED ORDER — OXYCODONE HCL 5 MG PO TABS
ORAL_TABLET | ORAL | Status: AC
  Filled 2022-02-02: qty 2

## 2022-02-02 MED ORDER — CEFAZOLIN SODIUM-DEXTROSE 2-4 GM/100ML-% IV SOLN
2.0000 g | Freq: Four times a day (QID) | INTRAVENOUS | Status: AC
  Administered 2022-02-02 (×2): 2 g via INTRAVENOUS
  Filled 2022-02-02 (×2): qty 100

## 2022-02-02 MED ORDER — GABAPENTIN 400 MG PO CAPS
1200.0000 mg | ORAL_CAPSULE | Freq: Every day | ORAL | Status: DC
  Administered 2022-02-03: 1200 mg via ORAL
  Filled 2022-02-02: qty 3

## 2022-02-02 MED ORDER — OXYCODONE HCL 5 MG PO TABS: 5.0000 mg | ORAL_TABLET | Freq: Once | ORAL | Status: DC | PRN

## 2022-02-02 MED ORDER — PHENOL 1.4 % MT LIQD: 1.0000 | OROMUCOSAL | Status: DC | PRN

## 2022-02-02 MED ORDER — MIDAZOLAM HCL 2 MG/2ML IJ SOLN
INTRAMUSCULAR | Status: AC
  Filled 2022-02-02: qty 2

## 2022-02-02 MED ORDER — BUPIVACAINE-EPINEPHRINE (PF) 0.25% -1:200000 IJ SOLN
INTRAMUSCULAR | Status: DC | PRN
  Administered 2022-02-02: 30 mL

## 2022-02-02 MED ORDER — FENTANYL CITRATE PF 50 MCG/ML IJ SOSY
25.0000 ug | PREFILLED_SYRINGE | INTRAMUSCULAR | Status: DC | PRN
  Administered 2022-02-02: 50 ug via INTRAVENOUS

## 2022-02-02 MED ORDER — BUPIVACAINE IN DEXTROSE 0.75-8.25 % IT SOLN
INTRATHECAL | Status: DC | PRN
  Administered 2022-02-02: 1.7 mL via INTRATHECAL

## 2022-02-02 MED ORDER — WATER FOR IRRIGATION, STERILE IR SOLN
Status: DC | PRN
  Administered 2022-02-02: 1500 mL

## 2022-02-02 MED ORDER — FENTANYL CITRATE PF 50 MCG/ML IJ SOSY
PREFILLED_SYRINGE | INTRAMUSCULAR | Status: AC
  Filled 2022-02-02: qty 1

## 2022-02-02 MED ORDER — BISACODYL 10 MG RE SUPP: 10.0000 mg | Freq: Every day | RECTAL | Status: DC | PRN

## 2022-02-02 MED ORDER — METOCLOPRAMIDE HCL 5 MG PO TABS: 5.0000 mg | ORAL_TABLET | Freq: Three times a day (TID) | ORAL | Status: DC | PRN

## 2022-02-02 MED ORDER — TRAZODONE HCL 50 MG PO TABS
50.0000 mg | ORAL_TABLET | Freq: Every evening | ORAL | Status: DC | PRN
  Administered 2022-02-02: 50 mg via ORAL
  Filled 2022-02-02: qty 1

## 2022-02-02 MED ORDER — FLUTICASONE PROPIONATE 50 MCG/ACT NA SUSP: 2.0000 | Freq: Every day | NASAL | Status: DC | PRN

## 2022-02-02 MED ORDER — POLYETHYLENE GLYCOL 3350 17 G PO PACK: 17.0000 g | PACK | Freq: Every day | ORAL | Status: DC | PRN

## 2022-02-02 MED ORDER — ACETAMINOPHEN 500 MG PO TABS
500.0000 mg | ORAL_TABLET | Freq: Four times a day (QID) | ORAL | Status: DC
  Administered 2022-02-02 – 2022-02-03 (×2): 500 mg via ORAL
  Filled 2022-02-02 (×3): qty 1

## 2022-02-02 MED ORDER — METHOCARBAMOL 500 MG PO TABS
500.0000 mg | ORAL_TABLET | Freq: Four times a day (QID) | ORAL | Status: DC | PRN
  Administered 2022-02-02 – 2022-02-03 (×2): 500 mg via ORAL
  Filled 2022-02-02 (×3): qty 1

## 2022-02-02 MED ORDER — BUPIVACAINE-EPINEPHRINE (PF) 0.25% -1:200000 IJ SOLN
INTRAMUSCULAR | Status: AC
  Filled 2022-02-02: qty 30

## 2022-02-02 MED ORDER — CEFAZOLIN SODIUM-DEXTROSE 2-4 GM/100ML-% IV SOLN
2.0000 g | INTRAVENOUS | Status: AC
  Administered 2022-02-02: 2 g via INTRAVENOUS
  Filled 2022-02-02: qty 100

## 2022-02-02 MED ORDER — PROPOFOL 10 MG/ML IV BOLUS
INTRAVENOUS | Status: DC | PRN
  Administered 2022-02-02: 30 mg via INTRAVENOUS
  Administered 2022-02-02: 20 mg via INTRAVENOUS

## 2022-02-02 SURGICAL SUPPLY — 42 items
BAG COUNTER SPONGE SURGICOUNT (BAG) IMPLANT
BAG DECANTER FOR FLEXI CONT (MISCELLANEOUS) IMPLANT
BAG SPEC THK2 15X12 ZIP CLS (MISCELLANEOUS)
BAG ZIPLOCK 12X15 (MISCELLANEOUS) IMPLANT
BLADE SAG 18X100X1.27 (BLADE) ×1 IMPLANT
COVER PERINEAL POST (MISCELLANEOUS) ×1 IMPLANT
COVER SURGICAL LIGHT HANDLE (MISCELLANEOUS) ×1 IMPLANT
CUP ACETBLR 48 OD SECTOR II (Hips) IMPLANT
DRAPE FOOT SWITCH (DRAPES) ×1 IMPLANT
DRAPE STERI IOBAN 125X83 (DRAPES) ×1 IMPLANT
DRAPE U-SHAPE 47X51 STRL (DRAPES) ×2 IMPLANT
DRSG AQUACEL AG ADV 3.5X10 (GAUZE/BANDAGES/DRESSINGS) ×1 IMPLANT
DURAPREP 26ML APPLICATOR (WOUND CARE) ×1 IMPLANT
ELECT REM PT RETURN 15FT ADLT (MISCELLANEOUS) ×1 IMPLANT
GLOVE BIO SURGEON STRL SZ 6.5 (GLOVE) IMPLANT
GLOVE BIO SURGEON STRL SZ7.5 (GLOVE) IMPLANT
GLOVE BIO SURGEON STRL SZ8 (GLOVE) ×1 IMPLANT
GLOVE BIOGEL PI IND STRL 6.5 (GLOVE) IMPLANT
GLOVE BIOGEL PI IND STRL 7.0 (GLOVE) IMPLANT
GLOVE BIOGEL PI IND STRL 8 (GLOVE) ×1 IMPLANT
GOWN STRL REUS W/ TWL LRG LVL3 (GOWN DISPOSABLE) ×1 IMPLANT
GOWN STRL REUS W/ TWL XL LVL3 (GOWN DISPOSABLE) IMPLANT
GOWN STRL REUS W/TWL LRG LVL3 (GOWN DISPOSABLE) ×3
GOWN STRL REUS W/TWL XL LVL3 (GOWN DISPOSABLE)
HEAD CERAMIC DELTA 28 P1.5 HIP (Head) IMPLANT
HOLDER FOLEY CATH W/STRAP (MISCELLANEOUS) ×1 IMPLANT
KIT TURNOVER KIT A (KITS) IMPLANT
LINER MARATHON 28 48 (Hips) IMPLANT
MANIFOLD NEPTUNE II (INSTRUMENTS) ×1 IMPLANT
PACK ANTERIOR HIP CUSTOM (KITS) ×1 IMPLANT
PENCIL SMOKE EVACUATOR COATED (MISCELLANEOUS) ×1 IMPLANT
SPIKE FLUID TRANSFER (MISCELLANEOUS) ×1 IMPLANT
STEM FEMORAL SZ 6MM STD ACTIS (Stem) IMPLANT
STRIP CLOSURE SKIN 1/2X4 (GAUZE/BANDAGES/DRESSINGS) ×1 IMPLANT
SUT ETHIBOND NAB CT1 #1 30IN (SUTURE) ×1 IMPLANT
SUT MNCRL AB 4-0 PS2 18 (SUTURE) ×1 IMPLANT
SUT STRATAFIX 0 PDS 27 VIOLET (SUTURE) ×1
SUT VIC AB 2-0 CT1 27 (SUTURE) ×2
SUT VIC AB 2-0 CT1 TAPERPNT 27 (SUTURE) ×2 IMPLANT
SUTURE STRATFX 0 PDS 27 VIOLET (SUTURE) ×1 IMPLANT
TRAY FOLEY MTR SLVR 16FR STAT (SET/KITS/TRAYS/PACK) ×1 IMPLANT
TUBE SUCTION HIGH CAP CLEAR NV (SUCTIONS) ×1 IMPLANT

## 2022-02-02 NOTE — Transfer of Care (Signed)
Immediate Anesthesia Transfer of Care Note  Patient: Crystal [name]  Procedure(s) Performed: TOTAL HIP ARTHROPLASTY ANTERIOR APPROACH (Right: Hip)  Patient Location: PACU  Anesthesia Type:Spinal  Level of Consciousness: drowsy  Airway & Oxygen Therapy: Patient Spontanous Breathing and Patient connected to face mask oxygen  Post-op Assessment: Report given to RN and Post -op Vital signs reviewed and stable  Post vital signs: Reviewed and stable  Last Vitals:  Vitals Value Taken Time  BP 110/72   Temp    Pulse 90 02/02/22 1314  Resp 16 02/02/22 1314  SpO2 100 % 02/02/22 1314  Vitals shown include unvalidated device data.  Last Pain:  Vitals:   02/02/22 1019  TempSrc:   PainSc: 5       Patients Stated Pain Goal: 3 (80/88/11 0315)  Complications: No notable events documented.

## 2022-02-02 NOTE — Discharge Instructions (Addendum)
Frank Aluisio, MD Total Joint Specialist [facility] [address] [place], [place] [address] [phone]  ANTERIOR APPROACH TOTAL HIP REPLACEMENT POSTOPERATIVE DIRECTIONS     Hip Rehabilitation, Guidelines Following Surgery  The results of a hip operation are greatly improved after range of motion and muscle strengthening exercises. Follow all safety measures which are given to protect your hip. If any of these exercises cause increased pain or swelling in your joint, decrease the amount until you are comfortable again. Then slowly increase the exercises. Call your caregiver if you have problems or questions.   BLOOD CLOT PREVENTION Take a 10 mg Xarelto once a day for three weeks following surgery. Then take an 81 mg Aspirin once a day for three weeks. Then discontinue Aspirin. You may resume your vitamins/supplements once you have discontinued the Xarelto. Do not take any NSAIDs (Advil, Aleve, Ibuprofen, Meloxicam, etc.) until you have discontinued the Xarelto.   HOME CARE INSTRUCTIONS  Remove items at home which could result in a fall. This includes throw rugs or furniture in walking pathways.  ICE to the affected hip as frequently as 20-30 minutes an hour and then as needed for pain and swelling. Continue to use ice on the hip for pain and swelling from surgery. You may notice swelling that will progress down to the foot and ankle. This is normal after surgery. Elevate the leg when you are not up walking on it.   Continue to use the breathing machine which will help keep your temperature down.  It is common for your temperature to cycle up and down following surgery, especially at night when you are not up moving around and exerting yourself.  The breathing machine keeps your lungs expanded and your temperature down.  DIET You may resume your previous home diet once your are discharged from the hospital.  DRESSING / WOUND CARE / SHOWERING You have an  adhesive waterproof bandage over the incision. Leave this in place until your first follow-up appointment. Once you remove this you will not need to place another bandage.  You may begin showering 3 days following surgery, but do not submerge the incision under water.  ACTIVITY For the first 3-5 days, it is important to rest and keep the operative leg elevated. You should, as a general rule, rest for 50 minutes and walk/stretch for 10 minutes per hour. After 5 days, you may slowly increase activity as tolerated.  Perform the exercises you were provided twice a day for about 15-20 minutes each session. Begin these 2 days following surgery. Walk with your walker as instructed. Use the walker until you are comfortable transitioning to a cane. Walk with the cane in the opposite hand of the operative leg. You may discontinue the cane once you are comfortable and walking steadily. Avoid periods of inactivity such as sitting longer than an hour when not asleep. This helps prevent blood clots.  Do not drive a car for 6 weeks or until released by your surgeon.  Do not drive while taking narcotics.  TED HOSE STOCKINGS Wear the elastic stockings on both legs for three weeks following surgery during the day. You may remove them at night while sleeping.  WEIGHT BEARING Weight bearing as tolerated with assist device (walker, cane, etc) as directed, use it as long as suggested by your surgeon or therapist, typically at least 4-6 weeks.  POSTOPERATIVE CONSTIPATION PROTOCOL Constipation - defined medically as fewer than three stools per week and severe constipation as less   than one stool per week.  One of the most common issues patients have following surgery is constipation.  Even if you have a regular bowel pattern at home, your normal regimen is likely to be disrupted due to multiple reasons following surgery.  Combination of anesthesia, postoperative narcotics, change in appetite and fluid intake all can  affect your bowels.  In order to avoid complications following surgery, here are some recommendations in order to help you during your recovery period.  Colace (docusate) - Pick up an over-the-counter form of Colace or another stool softener and take twice a day as long as you are requiring postoperative pain medications.  Take with a full glass of water daily.  If you experience loose stools or diarrhea, hold the colace until you stool forms back up.  If your symptoms do not get better within 1 week or if they get worse, check with your doctor. Dulcolax (bisacodyl) - Pick up over-the-counter and take as directed by the product packaging as needed to assist with the movement of your bowels.  Take with a full glass of water.  Use this product as needed if not relieved by Colace only.  MiraLax (polyethylene glycol) - Pick up over-the-counter to have on hand.  MiraLax is a solution that will increase the amount of water in your bowels to assist with bowel movements.  Take as directed and can mix with a glass of water, juice, soda, coffee, or tea.  Take if you go more than two days without a movement.Do not use MiraLax more than once per day. Call your doctor if you are still constipated or irregular after using this medication for 7 days in a row.  If you continue to have problems with postoperative constipation, please contact the office for further assistance and recommendations.  If you experience "the worst abdominal pain ever" or develop nausea or vomiting, please contact the office immediatly for further recommendations for treatment.  ITCHING  If you experience itching with your medications, try taking only a single pain pill, or even half a pain pill at a time.  You can also use Benadryl over the counter for itching or also to help with sleep.   MEDICATIONS See your medication summary on the "After Visit Summary" that the nursing staff will review with you prior to discharge.  You may have some home  medications which will be placed on hold until you complete the course of blood thinner medication.  It is important for you to complete the blood thinner medication as prescribed by your surgeon.  Continue your approved medications as instructed at time of discharge.  PRECAUTIONS If you experience chest pain or shortness of breath - call 911 immediately for transfer to the hospital emergency department.  If you develop a fever greater that 101 F, purulent drainage from wound, increased redness or drainage from wound, foul odor from the wound/dressing, or calf pain - CONTACT YOUR SURGEON.                                                   FOLLOW-UP APPOINTMENTS Make sure you keep all of your appointments after your operation with your surgeon and caregivers. You should call the office at the above phone number and make an appointment for approximately two weeks after the date of your surgery or on the date  instructed by your surgeon outlined in the "After Visit Summary".  RANGE OF MOTION AND STRENGTHENING EXERCISES  These exercises are designed to help you keep full movement of your hip joint. Follow your caregiver's or physical therapist's instructions. Perform all exercises about fifteen times, three times per day or as directed. Exercise both hips, even if you have had only one joint replacement. These exercises can be done on a training (exercise) mat, on the floor, on a table or on a bed. Use whatever works the best and is most comfortable for you. Use music or television while you are exercising so that the exercises are a pleasant break in your day. This will make your life better with the exercises acting as a break in routine you can look forward to.  Lying on your back, slowly slide your foot toward your buttocks, raising your knee up off the floor. Then slowly slide your foot back down until your leg is straight again.  Lying on your back spread your legs as far apart as you can without causing  discomfort.  Lying on your side, raise your upper leg and foot straight up from the floor as far as is comfortable. Slowly lower the leg and repeat.  Lying on your back, tighten up the muscle in the front of your thigh (quadriceps muscles). You can do this by keeping your leg straight and trying to raise your heel off the floor. This helps strengthen the largest muscle supporting your knee.  Lying on your back, tighten up the muscles of your buttocks both with the legs straight and with the knee bent at a comfortable angle while keeping your heel on the floor.   POST-OPERATIVE OPIOID TAPER INSTRUCTIONS: It is important to wean off of your opioid medication as soon as possible. If you do not need pain medication after your surgery it is ok to stop day one. Opioids include: Codeine, Hydrocodone(Norco, Vicodin), Oxycodone(Percocet, oxycontin) and hydromorphone amongst others.  Long term and even short term use of opiods can cause: Increased pain response Dependence Constipation Depression Respiratory depression And more.  Withdrawal symptoms can include Flu like symptoms Nausea, vomiting And more Techniques to manage these symptoms Hydrate well Eat regular healthy meals Stay active Use relaxation techniques(deep breathing, meditating, yoga) Do Not substitute Alcohol to help with tapering If you have been on opioids for less than two weeks and do not have pain than it is ok to stop all together.  Plan to wean off of opioids This plan should start within one week post op of your joint replacement. Maintain the same interval or time between taking each dose and first decrease the dose.  Cut the total daily intake of opioids by one tablet each day Next start to increase the time between doses. The last dose that should be eliminated is the evening dose.   IF YOU ARE TRANSFERRED TO A SKILLED REHAB FACILITY If the patient is transferred to a skilled rehab facility following release from the  hospital, a list of the current medications will be sent to the facility for the patient to continue.  When discharged from the skilled rehab facility, please have the facility set up the patient's Home Health Physical Therapy prior to being released. Also, the skilled facility will be responsible for providing the patient with their medications at time of release from the facility to include their pain medication, the muscle relaxants, and their blood thinner medication. If the patient is still at the rehab facility at   time of the two week follow up appointment, the skilled rehab facility will also need to assist the patient in arranging follow up appointment in our office and any transportation needs.  MAKE SURE YOU:  Understand these instructions.  Get help right away if you are not doing well or get worse.    DENTAL ANTIBIOTICS:  In most cases prophylactic antibiotics for Dental procdeures after total joint surgery are not necessary.  Exceptions are as follows:  1. History of prior total joint infection  2. Severely immunocompromised (Organ Transplant, cancer chemotherapy, Rheumatoid biologic meds such as Humera)  3. Poorly controlled diabetes (A1C &gt; 8.0, blood glucose over 200)  If you have one of these conditions, contact your surgeon for an antibiotic prescription, prior to your dental procedure.    Pick up stool softner and laxative for home use following surgery while on pain medications. Do not submerge incision under water. Please use good hand washing techniques while changing dressing each day. May shower starting three days after surgery. Please use a clean towel to pat the incision dry following showers. Continue to use ice for pain and swelling after surgery. Do not use any lotions or creams on the incision until instructed by your surgeon.      Information on my medicine - XARELTO (Rivaroxaban)  Why was Xarelto prescribed for you? Xarelto was prescribed  for you to reduce the risk of blood clots forming after orthopedic surgery. The medical term for these abnormal blood clots is venous thromboembolism (VTE).  What do you need to know about xarelto ? Take your Xarelto ONCE DAILY at the same time every day. You may take it either with or without food.  If you have difficulty swallowing the tablet whole, you may crush it and mix in applesauce just prior to taking your dose.  Take Xarelto exactly as prescribed by your doctor and DO NOT stop taking Xarelto without talking to the doctor who prescribed the medication.  Stopping without other VTE prevention medication to take the place of Xarelto may increase your risk of developing a clot.  After discharge, you should have regular check-up appointments with your healthcare provider that is prescribing your Xarelto.    What do you do if you miss a dose? If you miss a dose, take it as soon as you remember on the same day then continue your regularly scheduled once daily regimen the next day. Do not take two doses of Xarelto on the same day.   Important Safety Information A possible side effect of Xarelto is bleeding. You should call your healthcare provider right away if you experience any of the following: Bleeding from an injury or your nose that does not stop. Unusual colored urine (red or dark brown) or unusual colored stools (red or black). Unusual bruising for unknown reasons. A serious fall or if you hit your head (even if there is no bleeding).  Some medicines may interact with Xarelto and might increase your risk of bleeding while on Xarelto. To help avoid this, consult your healthcare provider or pharmacist prior to using any new prescription or non-prescription medications, including herbals, vitamins, non-steroidal anti-inflammatory drugs (NSAIDs) and supplements.  This website has more information on Xarelto: www.xarelto.com.   

## 2022-02-02 NOTE — Anesthesia Procedure Notes (Signed)
Procedure Name: MAC Date/Time: 02/02/2022 11:31 AM  Performed by: Niel Hummer, CRNAPre-anesthesia Checklist: Patient identified, Emergency Drugs available, Suction available and Patient being monitored Oxygen Delivery Method: Simple face mask

## 2022-02-02 NOTE — Op Note (Signed)
OPERATIVE REPORT- TOTAL HIP ARTHROPLASTY   PREOPERATIVE DIAGNOSIS: Osteonecrosis of the Right hip.   POSTOPERATIVE DIAGNOSIS: Osteonecrosis of the Right  hip.   PROCEDURE: Right total hip arthroplasty, anterior approach.   SURGEON: Gaynelle Arabian, MD   ASSISTANT: Theresa Duty, PA-C  ANESTHESIA:  Spinal  ESTIMATED BLOOD LOSS:-250 mL    DRAINS: None  COMPLICATIONS: None   CONDITION: PACU - hemodynamically stable.   BRIEF CLINICAL NOTE: Crystal [name] is a 46 y.o. female who has advanced end-  stage arthritis of their Right  hip with progressively worsening pain and  dysfunction.The patient has failed nonoperative management and presents for  total hip arthroplasty.   PROCEDURE IN DETAIL: After successful administration of spinal  anesthetic, the traction boots for the [facility] bed were placed on both  feet and the patient was placed onto the [facility] bed, boots placed into the leg  holders. The Right hip was then isolated from the perineum with plastic  drapes and prepped and draped in the usual sterile fashion. ASIS and  greater trochanter were marked and a oblique incision was made, starting  at about 1 cm lateral and 2 cm distal to the ASIS and coursing towards  the anterior cortex of the femur. The skin was cut with a 10 blade  through subcutaneous tissue to the level of the fascia overlying the  tensor fascia lata muscle. The fascia was then incised in line with the  incision at the junction of the anterior third and posterior 2/3rd. The  muscle was teased off the fascia and then the interval between the TFL  and the rectus was developed. The Hohmann retractor was then placed at  the top of the femoral neck over the capsule. The vessels overlying the  capsule were cauterized and the fat on top of the capsule was removed.  A Hohmann retractor was then placed anterior underneath the rectus  femoris to give exposure to the entire anterior capsule. A T-shaped   capsulotomy was performed. The edges were tagged and the femoral head  was identified.       Osteophytes are removed off the superior acetabulum.  The femoral neck was then cut in situ with an oscillating saw. Traction  was then applied to the left lower extremity utilizing the [facility]  traction. The femoral head was then removed. Retractors were placed  around the acetabulum and then circumferential removal of the labrum was  performed. Osteophytes were also removed. Reaming starts at 45 mm to  medialize and  Increased in 2 mm increments to 47 mm. We reamed in  approximately 40 degrees of abduction, 20 degrees anteversion. A 48 mm  pinnacle acetabular shell was then impacted in anatomic position under  fluoroscopic guidance with excellent purchase. We did not need to place  any additional dome screws. A 28 mm neutral + 4 marathon liner was then  placed into the acetabular shell.       The femoral lift was then placed along the lateral aspect of the femur  just distal to the vastus ridge. The leg was  externally rotated and capsule  was stripped off the inferior aspect of the femoral neck down to the  level of the lesser trochanter, this was done with electrocautery. The femur was lifted after this was performed. The  leg was then placed in an extended and adducted position essentially delivering the femur. We also removed the capsule superiorly and the piriformis from the piriformis fossa to  gain excellent exposure of the  proximal femur. Rongeur was used to remove some cancellous bone to get  into the lateral portion of the proximal femur for placement of the  initial starter reamer. The starter broaches was placed  the starter broach  and was shown to go down the center of the canal. Broaching  with the Actis system was then performed starting at size 0  coursing  Up to size 6. A size 6 had excellent torsional and rotational  and axial stability. The trial standard offset neck was then  placed  with a 28 + 1,5 trial head. The hip was then reduced. We confirmed that  the stem was in the canal both on AP and lateral x-rays. It also has excellent sizing. The hip was reduced with outstanding stability through full extension and full external rotation.. AP pelvis was taken and the leg lengths were measured and found to be equal. Hip was then dislocated again and the femoral head and neck removed. The  femoral broach was removed. Size 6 Actis stem with a standard offset  neck was then impacted into the femur following native anteversion. Has  excellent purchase in the canal. Excellent torsional and rotational and  axial stability. It is confirmed to be in the canal on AP and lateral  fluoroscopic views. The 28 + 1.5 ceramic head was placed and the hip  reduced with outstanding stability. Again AP pelvis was taken and it  confirmed that the leg lengths were equal. The wound was then copiously  irrigated with saline solution and the capsule reattached and repaired  with Ethibond suture. 30 ml of .25% Bupivicaine was  injected into the capsule and into the edge of the tensor fascia lata as well as subcutaneous tissue. The fascia overlying the tensor fascia lata was then closed with a running #1 V-Loc. Subcu was closed with interrupted 2-0 Vicryl and subcuticular running 4-0 Monocryl. Incision was cleaned  and dried. Steri-Strips and a bulky sterile dressing applied. The patient was awakened and transported to  recovery in stable condition.        Please note that a surgical assistant was a medical necessity for this procedure to perform it in a safe and expeditious manner. Assistant was necessary to provide appropriate retraction of vital neurovascular structures and to prevent femoral fracture and allow for anatomic placement of the prosthesis.  Gaynelle Arabian, M.D.

## 2022-02-02 NOTE — Anesthesia Preprocedure Evaluation (Signed)
Anesthesia Evaluation  Patient identified by MRN, date of birth, ID band Patient awake    Reviewed: Allergy & Precautions, H&P , NPO status , Patient's Chart, lab work & pertinent test results  Airway Mallampati: II   Neck ROM: full    Dental   Pulmonary Current Smoker,    breath sounds clear to auscultation       Cardiovascular  Rhythm:regular Rate:Normal  TTE (02/04/21): EF 60%, normal valves.   Neuro/Psych  Headaches, Seizures -,  PSYCHIATRIC DISORDERS Anxiety Depression Bipolar Disorder    GI/Hepatic GERD  ,  Endo/Other    Renal/GU      Musculoskeletal  (+) Arthritis , Avascular necrosis of hip   Abdominal   Peds  Hematology   Anesthesia Other Findings   Reproductive/Obstetrics H/o breast CA                             Anesthesia Physical Anesthesia Plan  ASA: 2  Anesthesia Plan: MAC and Spinal   Post-op Pain Management:    Induction: Intravenous  PONV Risk Score and Plan: 1 and Propofol infusion, Midazolam, Treatment may vary due to age or medical condition and Ondansetron  Airway Management Planned: Simple Face Mask  Additional Equipment:   Intra-op Plan:   Post-operative Plan:   Informed Consent: I have reviewed the patients History and Physical, chart, labs and discussed the procedure including the risks, benefits and alternatives for the proposed anesthesia with the patient or authorized representative who has indicated his/her understanding and acceptance.     Dental advisory given  Plan Discussed with: CRNA, Anesthesiologist and Surgeon  Anesthesia Plan Comments:         Anesthesia Quick Evaluation

## 2022-02-02 NOTE — Evaluation (Signed)
Physical Therapy Evaluation Patient Details Name: Crystal [name] MRN: [id number] DOB: [birth year] Today's Date: 02/02/2022  History of Present Illness  Pt is a 46yo female presenting s/p R-THAAA on 02/02/22 with recent L foot metatarsal fx 12/03/21. PMH: ETOH abuse, anorexia nervosa, anxiety & depression, avascular necrosis of hip, mixed bipolar, hx of breast cancer s/p b/l mastectomies s/p chemo, chronic pain, hx of DVT, GERD, hx of idiopathic seizure, hx of MRSA, migraines, neuropathy, R-tibial ORIF 2019 with removal 2020, L-THA 2020, chronic bronchitis, tobacco abuse, hx of SI with attempt   Clinical Impression  Crystal [name] is a 46 y.o. female POD 0 s/p R-THAAA. Patient reports modified independence using RW with mobility at baseline. Patient is now limited by functional impairments (see PT problem list below) and requires supervision for bed mobility and min guard for transfers. Patient was able to ambulate 30 feet with RW and min guard level of assist. Patient instructed in exercise to facilitate ROM and circulation to manage edema. Provided incentive spirometer and with Vcs pt able to achieve 1786m. Patient will benefit from continued skilled PT interventions to address impairments and progress towards PLOF. Acute PT will follow to progress mobility and stair training in preparation for safe discharge home.       Recommendations for follow up therapy are one component of a multi-disciplinary discharge planning process, led by the attending physician.  Recommendations may be updated based on patient status, additional functional criteria and insurance authorization.  Follow Up Recommendations Follow physician's recommendations for discharge plan and follow up therapies      Assistance Recommended at Discharge Intermittent Supervision/Assistance  Patient can return home with the following  A little help with walking and/or transfers;A little help with bathing/dressing/bathroom;Assistance  with cooking/housework;Assist for transportation;Help with stairs or ramp for entrance    Equipment Recommendations None recommended by PT  Recommendations for Other Services       Functional Status Assessment Patient has had a recent decline in their functional status and demonstrates the ability to make significant improvements in function in a reasonable and predictable amount of time.     Precautions / Restrictions Precautions Precautions: Fall Restrictions Weight Bearing Restrictions: No Other Position/Activity Restrictions: wbat      Mobility  Bed Mobility Overal bed mobility: Needs Assistance Bed Mobility: Supine to Sit     Supine to sit: Supervision     General bed mobility comments: for safety only    Transfers Overall transfer level: Needs assistance Equipment used: Rolling walker (2 wheels) Transfers: Sit to/from Stand Sit to Stand: Min guard, From elevated surface           General transfer comment: for safety only no physical assist required    Ambulation/Gait Ambulation/Gait assistance: Min guard Gait Distance (Feet): 30 Feet Assistive device: Rolling walker (2 wheels) Gait Pattern/deviations: Step-to pattern Gait velocity: decreased     General Gait Details: Pt ambulated with RW and min guard, no physical assist required or overt LOB noted.  Stairs            Wheelchair Mobility    Modified Rankin (Stroke Patients Only)       Balance Overall balance assessment: Needs assistance Sitting-balance support: No upper extremity supported, Feet supported Sitting balance-Leahy Scale: Good     Standing balance support: Reliant on assistive device for balance, During functional activity, Bilateral upper extremity supported Standing balance-Leahy Scale: Poor  Pertinent Vitals/Pain Pain Assessment Pain Assessment: 0-10 Pain Score: 10-Worst pain ever Pain Location: right hip Pain Descriptors /  Indicators: Operative site guarding Pain Intervention(s): Limited activity within patient's tolerance, Monitored during session, Repositioned, Ice applied    Home Living Family/patient expects to be discharged to:: Private residence Living Arrangements: Spouse/significant other Available Help at Discharge: Family;Available 24 hours/day Type of Home: House Home Access: Stairs to enter Entrance Stairs-Rails: Right;Left;Can reach both Entrance Stairs-Number of Steps: 5   Home Layout: One level;Laundry or work area in [place]: Conservation officer, nature (2 wheels);Shower seat;Hand held shower head;BSC/3in1;Cane - single point;Crutches      Prior Function Prior Level of Function : Independent/Modified Independent             Mobility Comments: RW ADLs Comments: IND     Hand Dominance        Extremity/Trunk Assessment   Upper Extremity Assessment Upper Extremity Assessment: Overall WFL for tasks assessed    Lower Extremity Assessment Lower Extremity Assessment: RLE deficits/detail;LLE deficits/detail RLE Deficits / Details: MMT ank DF/PF 5/5 RLE Sensation: WNL LLE Deficits / Details: MMT ank DF/PF 5/5 LLE Sensation: WNL    Cervical / Trunk Assessment Cervical / Trunk Assessment: Kyphotic  Communication   Communication: No difficulties  Cognition Arousal/Alertness: Awake/alert Behavior During Therapy: WFL for tasks assessed/performed, Impulsive Overall Cognitive Status: Within Functional Limits for tasks assessed                                 General Comments: Pt VERY impulsive and moves quickly, prior to instruction from PT.        General Comments General comments (skin integrity, edema, etc.): Parents present    Exercises Total Joint Exercises Ankle Circles/Pumps: AROM, Both, 10 reps   Assessment/Plan    PT Assessment Patient needs continued PT services  PT Problem List Decreased strength;Decreased range of motion;Decreased activity  tolerance;Decreased balance;Decreased mobility;Decreased coordination;Pain       PT Treatment Interventions DME instruction;Gait training;Stair training;Functional mobility training;Therapeutic activities;Therapeutic exercise;Balance training;Neuromuscular re-education;Patient/family education    PT Goals (Current goals can be found in the Care Plan section)  Acute Rehab PT Goals Patient Stated Goal: Get back home and back to living PT Goal Formulation: With patient Time For Goal Achievement: 02/09/22 Potential to Achieve Goals: Good    Frequency 7X/week     Co-evaluation               AM-PAC PT "6 Clicks" Mobility  Outcome Measure Help needed turning from your back to your side while in a flat bed without using bedrails?: None Help needed moving from lying on your back to sitting on the side of a flat bed without using bedrails?: A Little Help needed moving to and from a bed to a chair (including a wheelchair)?: A Little Help needed standing up from a chair using your arms (e.g., wheelchair or bedside chair)?: A Little Help needed to walk in hospital room?: A Little Help needed climbing 3-5 steps with a railing? : A Little 6 Click Score: 19    End of Session Equipment Utilized During Treatment: Gait belt Activity Tolerance: Patient tolerated treatment well;No increased pain Patient left: in chair;with call bell/phone within reach;with chair alarm set;with family/visitor present Nurse Communication: Mobility status PT Visit Diagnosis: Pain;Difficulty in walking, not elsewhere classified (R26.2) Pain - Right/Left: Right Pain - part of body: Hip    Time: 3154-0086 PT Time Calculation (  min) (ACUTE ONLY): 22 min   Charges:   PT Evaluation $PT Eval Low Complexity: [place], PT, DPT [facility] Department Office: [phone] Weekend pager: [phone]  Coolidge Breeze 02/02/2022, 7:06 PM

## 2022-02-02 NOTE — Interval H&P Note (Signed)
History and Physical Interval Note:  02/02/2022 11:25 AM  Crystal [name]  has presented today for surgery, with the diagnosis of right hip avascular necrosis.  The various methods of treatment have been discussed with the patient and family. After consideration of risks, benefits and other options for treatment, the patient has consented to  Procedure(s): TOTAL HIP ARTHROPLASTY ANTERIOR APPROACH (Right) as a surgical intervention.  The patient's history has been reviewed, patient examined, no change in status, stable for surgery.  I have reviewed the patient's chart and labs.  Questions were answered to the patient's satisfaction.     [name]

## 2022-02-02 NOTE — Anesthesia Procedure Notes (Signed)
Spinal  Patient location during procedure: OR Start time: 02/02/2022 11:32 AM End time: 02/02/2022 11:35 AM Reason for block: surgical anesthesia Staffing Performed: anesthesiologist  Anesthesiologist: Albertha Ghee, MD Performed by: Albertha Ghee, MD Authorized by: Albertha Ghee, MD   Preanesthetic Checklist Completed: patient identified, IV checked, risks and benefits discussed, surgical consent, monitors and equipment checked, pre-op evaluation and timeout performed Spinal Block Patient position: sitting Prep: DuraPrep Patient monitoring: cardiac monitor, continuous pulse ox and blood pressure Approach: midline Location: L3-4 Injection technique: single-shot Needle Needle type: Pencan  Needle gauge: 24 G Needle length: 9 cm Assessment Sensory level: T10 Events: CSF return Additional Notes Functioning IV was confirmed and monitors were applied. Sterile prep and drape, including hand hygiene and sterile gloves were used. The patient was positioned and the spine was prepped. The skin was anesthetized with lidocaine.  Free flow of clear CSF was obtained prior to injecting local anesthetic into the CSF.  The spinal needle aspirated freely following injection.  The needle was carefully withdrawn.  The patient tolerated the procedure well.

## 2022-02-02 NOTE — Anesthesia Postprocedure Evaluation (Signed)
Anesthesia Post Note  Patient: Crystal [name]  Procedure(s) Performed: TOTAL HIP ARTHROPLASTY ANTERIOR APPROACH (Right: Hip)     Patient location during evaluation: PACU Anesthesia Type: MAC and Spinal Level of consciousness: oriented and awake and alert Pain management: pain level controlled Vital Signs Assessment: post-procedure vital signs reviewed and stable Respiratory status: spontaneous breathing, respiratory function stable and patient connected to nasal cannula oxygen Cardiovascular status: blood pressure returned to baseline and stable Postop Assessment: no headache, no backache and no apparent nausea or vomiting Anesthetic complications: no   No notable events documented.  Last Vitals:  Vitals:   02/02/22 1630 02/02/22 1653  BP: 105/89 116/88  Pulse: 71 83  Resp: 17 18  Temp:  36.6 C  SpO2: 100% 100%    Last Pain:  Vitals:   02/02/22 1653  TempSrc: Oral  PainSc:                  Deltana S

## 2022-02-03 ENCOUNTER — Encounter (HOSPITAL_COMMUNITY): Payer: Self-pay | Admitting: Orthopedic Surgery

## 2022-02-03 ENCOUNTER — Other Ambulatory Visit: Payer: Self-pay

## 2022-02-03 ENCOUNTER — Other Ambulatory Visit (HOSPITAL_COMMUNITY): Payer: Self-pay

## 2022-02-03 DIAGNOSIS — Z86718 Personal history of other venous thrombosis and embolism: Secondary | ICD-10-CM | POA: Diagnosis not present

## 2022-02-03 DIAGNOSIS — F17210 Nicotine dependence, cigarettes, uncomplicated: Secondary | ICD-10-CM | POA: Diagnosis not present

## 2022-02-03 DIAGNOSIS — M1611 Unilateral primary osteoarthritis, right hip: Secondary | ICD-10-CM | POA: Diagnosis not present

## 2022-02-03 DIAGNOSIS — Z79899 Other long term (current) drug therapy: Secondary | ICD-10-CM | POA: Diagnosis not present

## 2022-02-03 DIAGNOSIS — Z853 Personal history of malignant neoplasm of breast: Secondary | ICD-10-CM | POA: Diagnosis not present

## 2022-02-03 DIAGNOSIS — Z96642 Presence of left artificial hip joint: Secondary | ICD-10-CM | POA: Diagnosis not present

## 2022-02-03 LAB — BASIC METABOLIC PANEL
Anion gap: 8 (ref 5–15)
BUN: 10 mg/dL (ref 6–20)
CO2: 21 mmol/L — ABNORMAL LOW (ref 22–32)
Calcium: 7.8 mg/dL — ABNORMAL LOW (ref 8.9–10.3)
Chloride: 111 mmol/L (ref 98–111)
Creatinine, Ser: 0.76 mg/dL (ref 0.44–1.00)
GFR, Estimated: >60 mL/min (ref >60)
Glucose, Bld: 133 mg/dL — ABNORMAL HIGH (ref 70–99)
Potassium: 3.7 mmol/L (ref 3.5–5.1)
Sodium: 140 mmol/L (ref 135–145)

## 2022-02-03 LAB — CBC
HCT: 35.5 % — ABNORMAL LOW (ref 36.0–46.0)
Hemoglobin: 12.4 g/dL (ref 12.0–15.0)
MCH: 33.5 pg (ref 26.0–34.0)
MCHC: 34.9 g/dL (ref 30.0–36.0)
MCV: 95.9 fL (ref 80.0–100.0)
Platelets: 206 10*3/uL (ref 150–400)
RBC: 3.7 MIL/uL — ABNORMAL LOW (ref 3.87–5.11)
RDW: 12.2 % (ref 11.5–15.5)
WBC: 13.6 10*3/uL — ABNORMAL HIGH (ref 4.0–10.5)
nRBC: 0 % (ref 0.0–0.2)

## 2022-02-03 MED ORDER — RIVAROXABAN 10 MG PO TABS: 10.0000 mg | ORAL_TABLET | Freq: Every day | ORAL | 0 refills | Status: AC

## 2022-02-03 MED ORDER — METHOCARBAMOL 500 MG PO TABS: 500.0000 mg | ORAL_TABLET | Freq: Four times a day (QID) | ORAL | 0 refills | Status: DC | PRN

## 2022-02-03 MED ORDER — OXYCODONE HCL 5 MG PO TABS: 5.0000 mg | ORAL_TABLET | Freq: Four times a day (QID) | ORAL | 0 refills | Status: DC | PRN

## 2022-02-03 NOTE — Progress Notes (Signed)
Physical Therapy Treatment Patient Details Name: Crystal [name] MRN: [id number] DOB: 12/22/1975 Today's Date: 02/03/2022   History of Present Illness Pt is a 46yo female presenting s/p R-THAAA on 02/02/22 with recent L foot metatarsal fx 12/03/21. PMH: ETOH abuse, anorexia nervosa, anxiety & depression, avascular necrosis of hip, mixed bipolar, hx of breast cancer s/p b/l mastectomies s/p chemo, chronic pain, hx of DVT, GERD, hx of idiopathic seizure, hx of MRSA, migraines, neuropathy, R-tibial ORIF 2019 with removal 2020, L-THA 2020, chronic bronchitis, tobacco abuse, hx of SI with attempt    PT Comments    Pt has met PT goals and is ready to DC home from a PT standpoint. She ambulated 67' with RW, no loss of balance.  Stair training completed. Pt demonstrates good understanding of HEP.   Recommendations for follow up therapy are one component of a multi-disciplinary discharge planning process, led by the attending physician.  Recommendations may be updated based on patient status, additional functional criteria and insurance authorization.  Follow Up Recommendations  Follow physician's recommendations for discharge plan and follow up therapies     Assistance Recommended at Discharge Intermittent Supervision/Assistance  Patient can return home with the following A little help with bathing/dressing/bathroom;Assistance with cooking/housework;Assist for transportation;Help with stairs or ramp for entrance   Equipment Recommendations  None recommended by PT    Recommendations for Other Services       Precautions / Restrictions Precautions Precautions: Fall Restrictions Weight Bearing Restrictions: No Other Position/Activity Restrictions: wbat     Mobility  Bed Mobility               General bed mobility comments: up in recliner    Transfers Overall transfer level: Modified independent Equipment used: Rolling walker (2 wheels) Transfers: Sit to/from Stand Sit to Stand:  Modified independent (Device/Increase time)           General transfer comment: good hand placement    Ambulation/Gait Ambulation/Gait assistance: Modified independent (Device/Increase time) Gait Distance (Feet): 130 Feet Assistive device: Rolling walker (2 wheels) Gait Pattern/deviations: Step-to pattern Gait velocity: decreased     General Gait Details: steady with RW, no loss of balance   Stairs Stairs: Yes Stairs assistance: Supervision Stair Management: One rail Right, Forwards Number of Stairs: 3 General stair comments: VCs sequencing   Wheelchair Mobility    Modified Rankin (Stroke Patients Only)       Balance Overall balance assessment: Needs assistance Sitting-balance support: No upper extremity supported, Feet supported Sitting balance-Leahy Scale: Good     Standing balance support: Reliant on assistive device for balance, During functional activity, Bilateral upper extremity supported Standing balance-Leahy Scale: Fair Standing balance comment: relies on RW for dynamic standing balance                            Cognition Arousal/Alertness: Awake/alert Behavior During Therapy: WFL for tasks assessed/performed Overall Cognitive Status: Within Functional Limits for tasks assessed                                          Exercises Total Joint Exercises Ankle Circles/Pumps: AROM, Both, 20 reps, Supine Quad Sets: AROM, Right, 10 reps, Supine Short Arc Quad: AROM, Right, 10 reps, Supine Heel Slides: AAROM, Right, 10 reps, Supine Hip ABduction/ADduction: AAROM, Right, 10 reps, Supine Long Arc Quad: AROM, Right, 10 reps, Seated  General Comments        Pertinent Vitals/Pain Pain Assessment Pain Score: 6  Pain Location: right hip Pain Descriptors / Indicators: Sore Pain Intervention(s): Limited activity within patient's tolerance, Monitored during session, Premedicated before session, Ice applied    Home Living                           Prior Function            PT Goals (current goals can now be found in the care plan section) Acute Rehab PT Goals Patient Stated Goal: Get back home and back to living PT Goal Formulation: With patient Time For Goal Achievement: 02/09/22 Potential to Achieve Goals: Good Progress towards PT goals: Goals met/education completed, patient discharged from PT    Frequency    7X/week      PT Plan Current plan remains appropriate    Co-evaluation              AM-PAC PT "6 Clicks" Mobility   Outcome Measure  Help needed turning from your back to your side while in a flat bed without using bedrails?: None Help needed moving from lying on your back to sitting on the side of a flat bed without using bedrails?: None Help needed moving to and from a bed to a chair (including a wheelchair)?: None Help needed standing up from a chair using your arms (e.g., wheelchair or bedside chair)?: None Help needed to walk in hospital room?: None Help needed climbing 3-5 steps with a railing? : A Little 6 Click Score: 23    End of Session Equipment Utilized During Treatment: Gait belt Activity Tolerance: Patient tolerated treatment well;No increased pain Patient left: in chair;with call bell/phone within reach;with chair alarm set;with family/visitor present Nurse Communication: Mobility status PT Visit Diagnosis: Pain;Difficulty in walking, not elsewhere classified (R26.2) Pain - Right/Left: Right Pain - part of body: Hip     Time: 0712-1975 PT Time Calculation (min) (ACUTE ONLY): 25 min  Charges:  $Gait Training: 8-22 mins $Therapeutic Exercise: 8-22 mins                     Blondell Reveal Kistler PT 02/03/2022  Acute Rehabilitation Services  Office [phone]

## 2022-02-03 NOTE — Progress Notes (Signed)
Subjective: 1 Day Post-Op Procedure(s) (LRB): TOTAL HIP ARTHROPLASTY ANTERIOR APPROACH (Right) Patient seen in rounds by Dr. Wynelle Link. Patient is well, and has had no acute complaints or problems. Denies SOB or chest pain. Denies calf pain. Foley cath removed this AM. Patient reports pain as moderate.  Worked with physical therapy yesterday and ambulated 30'.  Objective: Vital signs in last 24 hours: Temp:  [96.7 F (35.9 C)-98.5 F (36.9 C)] 98.1 F (36.7 C) (10/05 0429) Pulse Rate:  [49-96] 73 (10/05 0429) Resp:  [13-22] 18 (10/05 0429) BP: (92-124)/(64-101) 106/74 (10/05 0429) SpO2:  [96 %-100 %] 100 % (10/05 0429) Weight:  [61.9 kg] 61.9 kg (10/04 1019)  Intake/Output from previous day:  Intake/Output Summary (Last 24 hours) at 02/03/2022 0751 Last data filed at 02/03/2022 0700 Gross per 24 hour  Intake 1933.54 ml  Output 3100 ml  Net -1166.46 ml     Intake/Output this shift: No intake/output data recorded.  Labs: Recent Labs    02/03/22 0329  HGB 12.4   Recent Labs    02/03/22 0329  WBC 13.6*  RBC 3.70*  HCT 35.5*  PLT 206   Recent Labs    02/03/22 0329  NA 140  K 3.7  CL 111  CO2 21*  BUN 10  CREATININE 0.76  GLUCOSE 133*  CALCIUM 7.8*   No results for input(s): "LABPT", "INR" in the last 72 hours.  Exam: General - Patient is Alert and Oriented Extremity - Neurologically intact Neurovascular intact Sensation intact distally Dorsiflexion/Plantar flexion intact Dressing - dressing C/D/I Motor Function - intact, moving foot and toes well on exam.  Past Medical History:  Diagnosis Date   Alcohol abuse    Anorexia nervosa    Anxiety    Avascular necrosis of bone of hip (HCC)    Bipolar affective disorder, mixed (HCC)    Breast cancer (HCC)    Right   Chronic pain    Depression    Dilated cardiomyopathy secondary to drug (HCC)    DJD (degenerative joint disease)    "BONE STICKS OUT ON LEFT PART OF LOWER BACK"   DVT of upper extremity  (deep vein thrombosis) (HCC) 2014   Edema of upper extremity    Family history of anesthesia complication    MOTHER PONV   GERD (gastroesophageal reflux disease)    OCCASIONALLY TAKE ZANTAC   History of breast cancer DX JUNE 2012 W/ RIGHT BREAST INVASIVE DUCTAL CA  STAGE IIB---  S/P BILATERAL MASTECTOMIES AND RIGHT NODE DISSECTION   ONCOLOGIST-- DR Jana Hakim--  CHEMO ENDED DEC 2012 / RADIATION ENDED MAR 2013--  CURRENT ON TAMOXIFEN AND ZOLADEX   History of idiopathic seizure    DX 2008 --  LAST ONE 2012--  NO ISSUES SINCE AND NO MEDS. -- PT STATES DOCTORS FELT IT WAS STRESS RELATED DUE TO HEALTH ISSUES   Hot flashes due to tamoxifen    Hx MRSA infection 03/28/2011   Skin   Left ovarian cyst    Migraines    Neuropathy    Pneumonia    Seizures ([place])    2016 last seizure    Status post chemotherapy    docetaxel/carboplatin/trastuzumab    Assessment/Plan: 1 Day Post-Op Procedure(s) (LRB): TOTAL HIP ARTHROPLASTY ANTERIOR APPROACH (Right) Principal Problem:   Osteonecrosis of hip (HCC)  Estimated body mass index is 23.41 kg/m as calculated from the following:   Height as of this encounter: '5\' 4"'$  (1.626 m).   Weight as of this encounter: 61.9 kg. Advance  diet Up with therapy D/C IV fluids  DVT Prophylaxis - Xarelto Weight bearing as tolerated. Continue physical therapy.  Plan is to go Home after hospital stay. Expected discharge today pending progress with physical therapy. Will do HEP once discharged. Follow-up in clinic in 2 weeks.  The PDMP database was reviewed today prior to any opioid medications being prescribed to this patient.  R. Jaynie Bream, PA-C Orthopedic Surgery [phone] 02/03/2022, 7:51 AM

## 2022-02-03 NOTE — Progress Notes (Signed)
Patient transferred from the bed to the chair. Complaints of pain overnight.

## 2022-02-03 NOTE — Progress Notes (Signed)
Pt is ready for discharge.  PIV removed.  Pt remains alert and oriented.  Husband at bedside.  AVS given.  Pt able to verbalize understand of all discharge instructions, including wound care.  Pt has question about the price of xarelto and if she will be able to afford it.  Pt has contact [facility] and was told price of medication will not be known until it is filled.  Pt is ready to discharge.  Instructed pt to call RN back if she can't afford it.  I will then contact Dr. Anne Fu office and make him aware so an alternative medication can be ordered.  Pt states with previous hip surgery she took asa.

## 2022-02-03 NOTE — Plan of Care (Signed)
  Problem: Education: Goal: Knowledge of the prescribed therapeutic regimen will improve Outcome: Progressing Goal: Understanding of discharge needs will improve Outcome: Progressing Goal: Individualized Educational Video(s) Outcome: Progressing   Problem: Activity: Goal: Ability to avoid complications of mobility impairment will improve Outcome: Progressing Goal: Ability to tolerate increased activity will improve Outcome: Progressing   Problem: Clinical Measurements: Goal: Postoperative complications will be avoided or minimized Outcome: Progressing   Problem: Pain Management: Goal: Pain level will decrease with appropriate interventions Outcome: Progressing   Problem: Skin Integrity: Goal: Will show signs of wound healing Outcome: Progressing   Problem: Education: Goal: Knowledge of General Education information will improve Description: Including pain rating scale, medication(s)/side effects and non-pharmacologic comfort measures Outcome: Progressing   Problem: Health Behavior/Discharge Planning: Goal: Ability to manage health-related needs will improve Outcome: Progressing   Problem: Clinical Measurements: Goal: Ability to maintain clinical measurements within normal limits will improve Outcome: Progressing Goal: Will remain free from infection Outcome: Progressing Goal: Diagnostic test results will improve Outcome: Progressing Goal: Respiratory complications will improve Outcome: Progressing Goal: Cardiovascular complication will be avoided Outcome: Progressing   Problem: Activity: Goal: Risk for activity intolerance will decrease Outcome: Progressing   Problem: Nutrition: Goal: Adequate nutrition will be maintained Outcome: Progressing   Problem: Elimination: Goal: Will not experience complications related to bowel motility Outcome: Progressing Goal: Will not experience complications related to urinary retention Outcome: Progressing   Problem: Pain  Managment: Goal: General experience of comfort will improve Outcome: Progressing   Problem: Safety: Goal: Ability to remain free from injury will improve Outcome: Progressing   Problem: Skin Integrity: Goal: Risk for impaired skin integrity will decrease Outcome: Progressing

## 2022-02-03 NOTE — Care Plan (Signed)
Ortho Bundle Case Management Note  Patient Details  Name: Crystal [name] MRN: [id number] Date of Birth: Oct 05, 1975  R THA on 02-02-22 DCP:  Home with husband DME:  No needs, has a RW PT:  HEP                   DME Arranged:  N/A DME Agency:  NA  HH Arranged:  NA HH Agency:  NA  Additional Comments: Please contact me with any questions of if this plan should need to change.  Marianne Sofia, RN,CCM [facility]  [phone] 02/03/2022, 8:45 AM

## 2022-02-03 NOTE — TOC Transition Note (Signed)
Transition of Care [facility]) - CM/SW Discharge Note   Patient Details  Name: Crystal [name] MRN: [id number] Date of Birth: 05/28/1975  [facility] CM/SW Contact:  Lennart Pall, LCSW Phone Number: 02/03/2022, 12:04 PM   Clinical Narrative:    Confirmed pt has all needed DME at home.  Plan for HEP.  No TOC needs.   Final next level of care: Home/Self Care Barriers to Discharge: No Barriers Identified   Patient Goals and CMS Choice Patient states their goals for this hospitalization and ongoing recovery are:: return home      Discharge Placement                       Discharge Plan and Services                DME Arranged: N/A DME Agency: NA       HH Arranged: NA HH Agency: NA        Social Determinants of Health (SDOH) Interventions     Readmission Risk Interventions     No data to display

## 2022-02-03 NOTE — Plan of Care (Signed)
Problem: Education: Goal: Knowledge of the prescribed therapeutic regimen will improve 02/03/2022 1011 by Satya Bohall L, RN Outcome: Adequate for Discharge 02/03/2022 0909 by Sojourner Behringer L, RN Outcome: Progressing Goal: Understanding of discharge needs will improve 02/03/2022 1011 by Kaydyn Chism L, RN Outcome: Adequate for Discharge 02/03/2022 0909 by Keawe Marcello L, RN Outcome: Progressing Goal: Individualized Educational Video(s) 02/03/2022 1011 by Reece Mcbroom L, RN Outcome: Adequate for Discharge 02/03/2022 0909 by Yarrow Linhart L, RN Outcome: Progressing   Problem: Activity: Goal: Ability to avoid complications of mobility impairment will improve 02/03/2022 1011 by Brynlei Klausner L, RN Outcome: Adequate for Discharge 02/03/2022 0909 by Ambree Frances L, RN Outcome: Progressing Goal: Ability to tolerate increased activity will improve 02/03/2022 1011 by Sarai January L, RN Outcome: Adequate for Discharge 02/03/2022 0909 by Topanga Alvelo L, RN Outcome: Progressing   Problem: Clinical Measurements: Goal: Postoperative complications will be avoided or minimized 02/03/2022 1011 by Aleigha Gilani L, RN Outcome: Adequate for Discharge 02/03/2022 0909 by Avinash Maltos L, RN Outcome: Progressing   Problem: Pain Management: Goal: Pain level will decrease with appropriate interventions 02/03/2022 1011 by Ema Hebner L, RN Outcome: Adequate for Discharge 02/03/2022 0909 by Daylen Hack L, RN Outcome: Progressing   Problem: Skin Integrity: Goal: Will show signs of wound healing 02/03/2022 1011 by Lily Velasquez L, RN Outcome: Adequate for Discharge 02/03/2022 0909 by German Manke L, RN Outcome: Progressing   Problem: Education: Goal: Knowledge of General Education information will improve Description: Including pain rating scale, medication(s)/side effects and non-pharmacologic comfort measures 02/03/2022 1011 by Brandom Kerwin L, RN Outcome: Adequate for Discharge 02/03/2022  0909 by Travis Purk L, RN Outcome: Progressing   Problem: Health Behavior/Discharge Planning: Goal: Ability to manage health-related needs will improve 02/03/2022 1011 by Kim Lauver L, RN Outcome: Adequate for Discharge 02/03/2022 0909 by Daana Petrasek L, RN Outcome: Progressing   Problem: Clinical Measurements: Goal: Ability to maintain clinical measurements within normal limits will improve 02/03/2022 1011 by Arihaan Bellucci L, RN Outcome: Adequate for Discharge 02/03/2022 0909 by Lanijah Warzecha L, RN Outcome: Progressing Goal: Will remain free from infection 02/03/2022 1011 by Jadine Brumley L, RN Outcome: Adequate for Discharge 02/03/2022 0909 by Deneka Greenwalt L, RN Outcome: Progressing Goal: Diagnostic test results will improve 02/03/2022 1011 by Laveyah Oriol L, RN Outcome: Adequate for Discharge 02/03/2022 0909 by Uzair Godley L, RN Outcome: Progressing Goal: Respiratory complications will improve 02/03/2022 1011 by Shailynn Fong L, RN Outcome: Adequate for Discharge 02/03/2022 0909 by Clatie Kessen L, RN Outcome: Progressing Goal: Cardiovascular complication will be avoided 02/03/2022 1011 by Diamonte Stavely L, RN Outcome: Adequate for Discharge 02/03/2022 0909 by Kylei Purington L, RN Outcome: Progressing   Problem: Activity: Goal: Risk for activity intolerance will decrease 02/03/2022 1011 by Emelda Kohlbeck L, RN Outcome: Adequate for Discharge 02/03/2022 0909 by Daveion Robar L, RN Outcome: Progressing   Problem: Nutrition: Goal: Adequate nutrition will be maintained 02/03/2022 1011 by Tamaya Pun L, RN Outcome: Adequate for Discharge 02/03/2022 0909 by Willowdean Luhmann L, RN Outcome: Progressing   Problem: Coping: Goal: Level of anxiety will decrease 02/03/2022 1011 by Sylena Lotter L, RN Outcome: Adequate for Discharge 02/03/2022 0909 by Oaklyn Jakubek L, RN Outcome: Progressing   Problem: Elimination: Goal: Will not experience complications related to bowel  motility 02/03/2022 1011 by Novah Goza L, RN Outcome: Adequate for Discharge 02/03/2022 0909 by Kaden Daughdrill L, RN Outcome: Progressing Goal: Will not experience complications related to urinary retention 02/03/2022 1011 by Keileigh Vahey L, RN  Outcome: Adequate for Discharge 02/03/2022 0909 by Teofila Bowery L, RN Outcome: Progressing   Problem: Pain Managment: Goal: General experience of comfort will improve 02/03/2022 1011 by Larnce Schnackenberg L, RN Outcome: Adequate for Discharge 02/03/2022 0909 by Kaevon Cotta L, RN Outcome: Progressing   Problem: Safety: Goal: Ability to remain free from injury will improve 02/03/2022 1011 by Jammie Clink L, RN Outcome: Adequate for Discharge 02/03/2022 0909 by Kayton Ripp L, RN Outcome: Progressing   Problem: Skin Integrity: Goal: Risk for impaired skin integrity will decrease 02/03/2022 1011 by Massie Mees L, RN Outcome: Adequate for Discharge 02/03/2022 0909 by Letrice Pollok L, RN Outcome: Progressing

## 2022-02-04 NOTE — Discharge Summary (Signed)
Physician Discharge Summary   Patient ID: Crystal [name] MRN: [id number] DOB/AGE: 11-23-1975 46 y.o.  Admit date: 02/02/2022 Discharge date: 02/03/2022  Primary Diagnosis: Osteonecrosis of the right hip.   Admission Diagnoses:  Past Medical History:  Diagnosis Date   Alcohol abuse    Anorexia nervosa    Anxiety    Avascular necrosis of bone of hip ([place])    Bipolar affective disorder, mixed ([place])    Breast cancer ([place])    Right   Chronic pain    Depression    Dilated cardiomyopathy secondary to drug ([facility])    DJD (degenerative joint disease)    "BONE STICKS OUT ON LEFT PART OF LOWER BACK"   DVT of upper extremity (deep vein thrombosis) ([place]) 2014   Edema of upper extremity    Family history of anesthesia complication    MOTHER PONV   GERD (gastroesophageal reflux disease)    OCCASIONALLY TAKE ZANTAC   History of breast cancer DX JUNE 2012 W/ RIGHT BREAST INVASIVE DUCTAL CA  STAGE IIB---  S/P BILATERAL MASTECTOMIES AND RIGHT NODE DISSECTION   ONCOLOGIST-- DR Jana Hakim--  CHEMO ENDED [place] 2012 / RADIATION ENDED MAR 2013--  CURRENT ON TAMOXIFEN AND ZOLADEX   History of idiopathic seizure    DX 2008 --  LAST ONE 2012--  NO ISSUES SINCE AND NO MEDS. -- PT STATES DOCTORS FELT IT WAS STRESS RELATED DUE TO HEALTH ISSUES   Hot flashes due to tamoxifen    Hx MRSA infection 03/28/2011   Skin   Left ovarian cyst    Migraines    Neuropathy    Pneumonia    Seizures ([place])    2016 last seizure    Status post chemotherapy    docetaxel/carboplatin/trastuzumab   Discharge Diagnoses:   Principal Problem:   Osteonecrosis of hip ([place])  Estimated body mass index is 23.41 kg/m as calculated from the following:   Height as of this encounter: '5\' 4"'$  (1.626 m).   Weight as of this encounter: 61.9 kg.  Procedure:  Procedure(s) (LRB): TOTAL HIP ARTHROPLASTY ANTERIOR APPROACH (Right)   Consults: None  HPI: Crystal [name] is a 46 y.o. female who has advanced end-stage arthritis of their  right hip with progressively worsening pain and dysfunction.The patient has failed nonoperative management and presents for  total hip arthroplasty.   Laboratory Data: Admission on 02/02/2022, Discharged on 02/03/2022  Component Date Value Ref Range Status   Preg Test, Ur 02/02/2022 NEGATIVE  NEGATIVE Final   Comment:        THE SENSITIVITY OF THIS METHODOLOGY IS >24 mIU/mL    WBC 02/03/2022 13.6 (H)  4.0 - 10.5 K/uL Final   RBC 02/03/2022 3.70 (L)  3.87 - 5.11 MIL/uL Final   Hemoglobin 02/03/2022 12.4  12.0 - 15.0 g/dL Final   HCT 02/03/2022 35.5 (L)  36.0 - 46.0 % Final   MCV 02/03/2022 95.9  80.0 - 100.0 fL Final   MCH 02/03/2022 33.5  26.0 - 34.0 pg Final   MCHC 02/03/2022 34.9  30.0 - 36.0 g/dL Final   RDW 02/03/2022 12.2  11.5 - 15.5 % Final   Platelets 02/03/2022 206  150 - 400 K/uL Final   nRBC 02/03/2022 0.0  0.0 - 0.2 % Final   Performed at [facility], [place] [address] [place], [place] [address]   Sodium 02/03/2022 140  135 - 145 mmol/L Final   Potassium 02/03/2022 3.7  3.5 - 5.1 mmol/L Final   Chloride 02/03/2022 111  98 - 111 mmol/L Final   CO2 02/03/2022 21 (L)  22 - 32 mmol/L Final   Glucose, Bld 02/03/2022 133 (H)  70 - 99 mg/dL Final   Glucose reference range applies only to samples taken after fasting for at least 8 hours.   BUN 02/03/2022 10  6 - 20 mg/dL Final   Creatinine, Ser 02/03/2022 0.76  0.44 - 1.00 mg/dL Final   Calcium 02/03/2022 7.8 (L)  8.9 - 10.3 mg/dL Final   GFR, Estimated 02/03/2022 >60  >60 mL/min Final   Comment: (NOTE) Calculated using the CKD-EPI Creatinine Equation (2021)    Anion gap 02/03/2022 8  5 - 15 Final   Performed at [facility], [place] [address], [place], [place] [address]  Hospital Outpatient Visit on 01/20/2022  Component Date Value Ref Range Status   MRSA, PCR 01/20/2022 NEGATIVE  NEGATIVE Final   Staphylococcus aureus 01/20/2022 NEGATIVE  NEGATIVE Final   Comment: (NOTE) The Xpert SA  Assay (FDA approved for NASAL specimens in patients 16 years of age and older), is one component of a comprehensive surveillance program. It is not intended to diagnose infection nor to guide or monitor treatment. Performed at [facility], [place] [address], [place], [place] [address]    Sodium 01/20/2022 139  135 - 145 mmol/L Final   Potassium 01/20/2022 4.1  3.5 - 5.1 mmol/L Final   Chloride 01/20/2022 110  98 - 111 mmol/L Final   CO2 01/20/2022 23  22 - 32 mmol/L Final   Glucose, Bld 01/20/2022 95  70 - 99 mg/dL Final   Glucose reference range applies only to samples taken after fasting for at least 8 hours.   BUN 01/20/2022 10  6 - 20 mg/dL Final   Creatinine, Ser 01/20/2022 0.79  0.44 - 1.00 mg/dL Final   Calcium 01/20/2022 9.1  8.9 - 10.3 mg/dL Final   Total Protein 01/20/2022 7.1  6.5 - 8.1 g/dL Final   Albumin 01/20/2022 4.2  3.5 - 5.0 g/dL Final   AST 01/20/2022 20  15 - 41 U/L Final   ALT 01/20/2022 17  0 - 44 U/L Final   Alkaline Phosphatase 01/20/2022 60  38 - 126 U/L Final   Total Bilirubin 01/20/2022 0.5  0.3 - 1.2 mg/dL Final   GFR, Estimated 01/20/2022 >60  >60 mL/min Final   Comment: (NOTE) Calculated using the CKD-EPI Creatinine Equation (2021)    Anion gap 01/20/2022 6  5 - 15 Final   Performed at [facility], [place] [address] [place], [place] [address]   WBC 01/20/2022 7.0  4.0 - 10.5 K/uL Final   RBC 01/20/2022 4.63  3.87 - 5.11 MIL/uL Final   Hemoglobin 01/20/2022 15.6 (H)  12.0 - 15.0 g/dL Final   HCT 01/20/2022 44.1  36.0 - 46.0 % Final   MCV 01/20/2022 95.2  80.0 - 100.0 fL Final   MCH 01/20/2022 33.7  26.0 - 34.0 pg Final   MCHC 01/20/2022 35.4  30.0 - 36.0 g/dL Final   RDW 01/20/2022 12.4  11.5 - 15.5 % Final   Platelets 01/20/2022 252  150 - 400 K/uL Final   nRBC 01/20/2022 0.0  0.0 - 0.2 % Final   Performed at [facility], [place] [address] [place], [place] [address]   ABO/RH(D) 01/20/2022 O POS    Final   Antibody Screen 01/20/2022 NEG   Final   Sample Expiration 01/20/2022 02/03/2022,2359   Final   Extend sample  reason 01/20/2022    Final                   Value:NO TRANSFUSIONS OR PREGNANCY IN THE PAST 3 MONTHS Performed at [place] [address], [place], [place] [address]      X-Rays:DG Pelvis Portable  Result Date: 02/02/2022 CLINICAL DATA:  Right hip replacement. EXAM: PORTABLE PELVIS 1-2 VIEWS COMPARISON:  Pelvis x-ray dated June 13, 2018. FINDINGS: The right hip demonstrates a total arthroplasty without evidence of hardware failure or complication. There is no fracture or dislocation. The alignment is anatomic. Post-surgical changes noted in the surrounding soft tissues. Prior left total hip arthroplasty. IMPRESSION: 1. Right total hip arthroplasty without acute postoperative complication. Electronically Signed   By: Titus Dubin M.D.   On: 02/02/2022 14:03   DG HIP UNILAT WITH PELVIS 1V RIGHT  Result Date: 02/02/2022 CLINICAL DATA:  Fluoroscopic assistance for right hip arthroplasty EXAM: DG HIP (WITH OR WITHOUT PELVIS) 1V RIGHT COMPARISON:  None Available. FINDINGS: Fluoroscopic images show right hip arthroplasty. There is evidence of previous left hip arthroplasty. Fluoroscopy time 7 seconds. Radiation dose 0.735 mGy. IMPRESSION: Fluoroscopic assistance was provided for right hip arthroplasty. Electronically Signed   By: Elmer Picker M.D.   On: 02/02/2022 13:27   DG C-Arm 1-60 Min-No Report  Result Date: 02/02/2022 Fluoroscopy was utilized by the requesting physician.  No radiographic interpretation.   DG C-Arm 1-60 Min-No Report  Result Date: 02/02/2022 Fluoroscopy was utilized by the requesting physician.  No radiographic interpretation.    EKG: Orders placed or performed in visit on 06/04/21   EKG 12-Lead   *Note: Due to a large number of results and/or encounters for the requested time period, some results have not been  displayed. A complete set of results can be found in Results Review.     Hospital Course: Crystal [name] is a 46 y.o. who was admitted to [facility]. They were brought to the operating room on 02/02/2022 and underwent Procedure(s): [place].  Patient tolerated the procedure well and was later transferred to the recovery room and then to the orthopaedic floor for postoperative care. They were given PO and IV analgesics for pain control following their surgery. They were given 24 hours of postoperative antibiotics of  Anti-infectives (From admission, onward)    Start     Dose/Rate Route Frequency Ordered Stop   02/02/22 1800  ceFAZolin (ANCEF) IVPB 2g/100 mL premix        2 g 200 mL/hr over 30 Minutes Intravenous Every 6 hours 02/02/22 1656 02/03/22 0858   02/02/22 1000  ceFAZolin (ANCEF) IVPB 2g/100 mL premix        2 g 200 mL/hr over 30 Minutes Intravenous On call to O.R. 02/02/22 4174 02/02/22 1205      and started on DVT prophylaxis in the form of Xarelto.   PT and OT were ordered for total joint protocol. Discharge planning consulted to help with postop disposition and equipment needs.  Patient had a fair night on the evening of surgery. They started to get up OOB with therapy on POD #0. Pt was seen during rounds and was ready to go home pending progress with therapy. She worked with therapy on POD #1 and was meeting her goals. Pt was discharged to home later that day in stable condition.  Diet: Regular diet Activity: WBAT Follow-up: in 2 weeks Disposition: Home Discharged Condition: stable   Discharge Instructions  Call MD / Call 911   Complete by: As directed    If you experience chest pain or shortness of breath, CALL 911 and be transported to the hospital emergency room.  If you develope a fever above 101 F, pus (white drainage) or increased drainage or redness at the wound, or calf pain, call your surgeon's office.   Change dressing    Complete by: As directed    You have an adhesive waterproof bandage over the incision. Leave this in place until your first follow-up appointment. Once you remove this you will not need to place another bandage.   Constipation Prevention   Complete by: As directed    Drink plenty of fluids.  Prune juice may be helpful.  You may use a stool softener, such as Colace (over the counter) 100 mg twice a day.  Use MiraLax (over the counter) for constipation as needed.   Diet - low sodium heart healthy   Complete by: As directed    Do not sit on low chairs, stoools or toilet seats, as it may be difficult to get up from low surfaces   Complete by: As directed    Driving restrictions   Complete by: As directed    No driving for two weeks   Post-operative opioid taper instructions:   Complete by: As directed    POST-OPERATIVE OPIOID TAPER INSTRUCTIONS: It is important to wean off of your opioid medication as soon as possible. If you do not need pain medication after your surgery it is ok to stop day one. Opioids include: Codeine, Hydrocodone(Norco, Vicodin), Oxycodone(Percocet, oxycontin) and hydromorphone amongst others.  Long term and even short term use of opiods can cause: Increased pain response Dependence Constipation Depression Respiratory depression And more.  Withdrawal symptoms can include Flu like symptoms Nausea, vomiting And more Techniques to manage these symptoms Hydrate well Eat regular healthy meals Stay active Use relaxation techniques(deep breathing, meditating, yoga) Do Not substitute Alcohol to help with tapering If you have been on opioids for less than two weeks and do not have pain than it is ok to stop all together.  Plan to wean off of opioids This plan should start within one week post op of your joint replacement. Maintain the same interval or time between taking each dose and first decrease the dose.  Cut the total daily intake of opioids by one tablet each  day Next start to increase the time between doses. The last dose that should be eliminated is the evening dose.      TED hose   Complete by: As directed    Use stockings (TED hose) for three weeks on both leg(s).  You may remove them at night for sleeping.   Weight bearing as tolerated   Complete by: As directed       Allergies as of 02/03/2022       Reactions   Thorazine [chlorpromazine Hcl] Hives   Tramadol Other (See Comments)   Seizures   Phenytoin Nausea And Vomiting   Ciprofloxacin Nausea And Vomiting   Vancomycin Rash, Other (See Comments)   Possible rash reported by MD        Medication List     STOP taking these medications    gabapentin 600 MG tablet Commonly known as: NEURONTIN   ibuprofen 200 MG tablet Commonly known as: ADVIL       TAKE these medications    albuterol 108 (90 Base) MCG/ACT inhaler Commonly known as: VENTOLIN HFA INHALE 2  PUFFS INTO THE LUNGS EVERY 4 HOURS AS NEEDED FOR WHEEZING OR SHORTNESS OF BREATH. What changed: when to take this   ALLERGY EYE DROPS OP Place 2 drops into both eyes 2 (two) times daily as needed (allergies).   ALPRAZolam 1 MG tablet Commonly known as: XANAX Take 1 mg by mouth 4 (four) times daily.   amphetamine-dextroamphetamine 10 MG tablet Commonly known as: ADDERALL Take 10 mg by mouth daily as needed.   amphetamine-dextroamphetamine 30 MG 24 hr capsule Commonly known as: ADDERALL XR Take 30 mg by mouth daily.   esomeprazole 20 MG capsule Commonly known as: NEXIUM Take 20 mg by mouth daily as needed (reflux).   Eszopiclone 3 MG Tabs Take 3 mg by mouth at bedtime. Take immediately before bedtime   fluticasone 50 MCG/ACT nasal spray Commonly known as: FLONASE SPRAY 1 SPRAY INTO BOTH NOSTRILS DAILY. What changed: See the new instructions.   ipratropium-albuterol 0.5-2.5 (3) MG/3ML Soln Commonly known as: DUONEB Take 3 mLs by nebulization every 6 (six) hours as needed. What changed:  when to  take this reasons to take this   methocarbamol 500 MG tablet Commonly known as: ROBAXIN Take 1 tablet (500 mg total) by mouth every 6 (six) hours as needed for muscle spasms.   oxyCODONE 5 MG immediate release tablet Commonly known as: Oxy IR/ROXICODONE Take 1-2 tablets (5-10 mg total) by mouth every 6 (six) hours as needed for severe pain.   perphenazine 4 MG tablet Commonly known as: TRILAFON Take 4 mg by mouth 2 (two) times daily.   rivaroxaban 10 MG Tabs tablet Commonly known as: XARELTO Take 1 tablet (10 mg total) by mouth daily with breakfast for 20 days. Then take one 81 mg aspirin once a day for three weeks. Then discontinue.   traZODone 50 MG tablet Commonly known as: DESYREL Take 50 mg by mouth at bedtime as needed for sleep.               Discharge Care Instructions  (From admission, onward)           Start     Ordered   02/03/22 0000  Weight bearing as tolerated        02/03/22 0840   02/03/22 0000  Change dressing       Comments: You have an adhesive waterproof bandage over the incision. Leave this in place until your first follow-up appointment. Once you remove this you will not need to place another bandage.   02/03/22 0840            Follow-up Information     Gaynelle Arabian, MD. Go on 02/15/2022.   Specialty: Orthopedic Surgery Why: You are scheduled for a follow up appointment on 02-15-22 at 2:30 pm. Contact information: [address] [place] [address] [phone]                 Signed: R. Jaynie Bream, PA-C Orthopedic Surgery 02/04/2022, 10:00 AM

## 2022-02-21 ENCOUNTER — Other Ambulatory Visit: Payer: Self-pay | Admitting: *Deleted

## 2022-02-21 DIAGNOSIS — C50411 Malignant neoplasm of upper-outer quadrant of right female breast: Secondary | ICD-10-CM

## 2022-02-22 ENCOUNTER — Inpatient Hospital Stay: Payer: Medicare Other | Admitting: Hematology and Oncology

## 2022-02-22 ENCOUNTER — Inpatient Hospital Stay: Payer: Medicare Other | Attending: Family Medicine

## 2022-03-15 DIAGNOSIS — Z4789 Encounter for other orthopedic aftercare: Secondary | ICD-10-CM | POA: Diagnosis not present

## 2022-03-22 ENCOUNTER — Encounter: Payer: Self-pay | Admitting: Family

## 2022-03-22 ENCOUNTER — Ambulatory Visit (INDEPENDENT_AMBULATORY_CARE_PROVIDER_SITE_OTHER): Payer: Medicare Other | Admitting: Family

## 2022-03-22 VITALS — BP 124/82 | HR 78 | Temp 98.4°F | Resp 18 | Ht 64.0 in | Wt 136.2 lb

## 2022-03-22 DIAGNOSIS — R109 Unspecified abdominal pain: Secondary | ICD-10-CM | POA: Diagnosis not present

## 2022-03-22 DIAGNOSIS — R350 Frequency of micturition: Secondary | ICD-10-CM

## 2022-03-22 DIAGNOSIS — D509 Iron deficiency anemia, unspecified: Secondary | ICD-10-CM | POA: Diagnosis not present

## 2022-03-22 DIAGNOSIS — R110 Nausea: Secondary | ICD-10-CM | POA: Diagnosis not present

## 2022-03-22 DIAGNOSIS — R3915 Urgency of urination: Secondary | ICD-10-CM | POA: Diagnosis not present

## 2022-03-22 LAB — POCT URINALYSIS DIP (MANUAL ENTRY)
Bilirubin, UA: NEGATIVE
Glucose, UA: NEGATIVE mg/dL
Ketones, POC UA: NEGATIVE mg/dL
Leukocytes, UA: NEGATIVE
Nitrite, UA: NEGATIVE
Protein Ur, POC: NEGATIVE mg/dL
Spec Grav, UA: 1.025 (ref 1.010–1.025)
Urobilinogen, UA: 0.2 E.U./dL
pH, UA: 6 (ref 5.0–8.0)

## 2022-03-22 MED ORDER — SUCRALFATE 1 G PO TABS: 1.0000 g | ORAL_TABLET | Freq: Three times a day (TID) | ORAL | 0 refills | Status: DC

## 2022-03-22 MED ORDER — ESOMEPRAZOLE MAGNESIUM 40 MG PO CPDR: 40.0000 mg | DELAYED_RELEASE_CAPSULE | Freq: Every day | ORAL | 0 refills | Status: DC

## 2022-03-22 MED ORDER — ONDANSETRON 4 MG PO TBDP
4.0000 mg | ORAL_TABLET | Freq: Once | ORAL | Status: AC
  Administered 2022-03-22: 4 mg via ORAL

## 2022-03-22 NOTE — Progress Notes (Signed)
Crystal [name] is a 46 y.o. female with the following history as recorded in EpicCare:  Patient Active Problem List   Diagnosis Date Noted   Chronic bronchitis ([place]) 04/29/2021   Pseudomonas infection 04/29/2021   Chronic rhinitis 04/29/2021   Abnormal EKG 04/29/2021   Tobacco abuse 04/29/2021   MDD (major depressive disorder) 08/21/2019   Severe recurrent major depression without psychotic features ([place]) 08/21/2019   Suicide attempt ([place]) 08/21/2019   Osteonecrosis of hip ([place]) 06/13/2018   Tibial plateau fracture, right, closed, initial encounter 11/27/2017   Anorexia nervosa 08/19/2014   Back pain 08/19/2014   Malnutrition, calorie ([place]) 12/03/2013   Breast cancer of upper-outer quadrant of right female breast ([place]) 03/21/2013   Chronic pain 01/09/2013   DVT of upper extremity (deep vein thrombosis) ([place]) 01/02/2013   Arm edema 12/27/2012   Neuropathic pain 12/27/2012   Chronic female pelvic pain 08/21/2012   Fatigue 08/20/2012   Chemotherapy induced cardiomyopathy ([place]) 09/08/2011   Generalized convulsive seizure ([place]) 07/13/2011   Seizure disorder ([place]) 07/13/2011   Migraine 07/13/2011   Anxiety disorder 07/13/2011   Depression 07/13/2011   Hx MRSA infection 04/13/2011    Current Outpatient Medications  Medication Sig Dispense Refill   albuterol (VENTOLIN HFA) 108 (90 Base) MCG/ACT inhaler INHALE 2 PUFFS INTO THE LUNGS EVERY 4 HOURS AS NEEDED FOR WHEEZING OR SHORTNESS OF BREATH. (Patient taking differently: Inhale 2 puffs into the lungs as needed for wheezing or shortness of breath.) 18 each 2   ALPRAZolam (XANAX) 1 MG tablet Take 1 mg by mouth 4 (four) times daily.     esomeprazole (NEXIUM) 40 MG capsule Take 1 capsule (40 mg total) by mouth daily at 12 noon. 30 capsule 0   Eszopiclone 3 MG TABS Take 3 mg by mouth at bedtime. Take immediately before bedtime     sucralfate (CARAFATE) 1 g tablet Take 1 tablet (1 g total) by mouth 4 (four) times daily -  with meals and at  bedtime. 60 tablet 0   ipratropium-albuterol (DUONEB) 0.5-2.5 (3) MG/3ML SOLN Take 3 mLs by nebulization every 6 (six) hours as needed. (Patient taking differently: Take 3 mLs by nebulization as needed (shortness of breath/wheezing).) 360 mL 1   No current facility-administered medications for this visit.    Allergies: Thorazine [chlorpromazine hcl], Tramadol, Phenytoin, Xarelto [rivaroxaban], Ciprofloxacin, and Vancomycin  Past Medical History:  Diagnosis Date   Alcohol abuse    Anorexia nervosa    Anxiety    Avascular necrosis of bone of hip (HCC)    Bipolar affective disorder, mixed ([place])    Breast cancer ([place])    Right   Chronic pain    Depression    Dilated cardiomyopathy secondary to drug ([place])    DJD (degenerative joint disease)    "BONE STICKS OUT ON LEFT PART OF LOWER BACK"   DVT of upper extremity (deep vein thrombosis) ([place]) 2014   Edema of upper extremity    Family history of anesthesia complication    MOTHER PONV   GERD (gastroesophageal reflux disease)    OCCASIONALLY TAKE ZANTAC   History of breast cancer DX JUNE 2012 W/ RIGHT BREAST INVASIVE DUCTAL CA  STAGE IIB---  S/P BILATERAL MASTECTOMIES AND RIGHT NODE DISSECTION   ONCOLOGIST-- DR Jana Hakim--  CHEMO ENDED [place] 2012 / RADIATION ENDED MAR 2013--  CURRENT ON TAMOXIFEN AND ZOLADEX   History of idiopathic seizure    DX 2008 --  LAST ONE 2012--  NO ISSUES SINCE AND NO MEDS. --  PT STATES DOCTORS FELT IT WAS STRESS RELATED DUE TO HEALTH ISSUES   Hot flashes due to tamoxifen    Hx MRSA infection 03/28/2011   Skin   Left ovarian cyst    Migraines    Neuropathy    Pneumonia    Seizures ([place])    2016 last seizure    Status post chemotherapy    docetaxel/carboplatin/trastuzumab    Past Surgical History:  Procedure Laterality Date   BIOPSY BREAST  10/14/2010   right needle core biopsy   BREAST SURGERY  11/16/2010   bilateral mastectomy+ right axillary node dissection,T1cN1a, Her2+,ERPR+  (right breast invasive  ductal carcinoma)   CYSTO WITH HYDRODISTENSION N/A 07/23/2012   Procedure: CYSTOSCOPY/HYDRODISTENSION instillation of marcaine and pyridium;  Surgeon: Reece Packer, MD;  Location: [facility];  Service: Urology;  Laterality: N/A;  INSTILLATION     HARDWARE REMOVAL Right 12/03/2018   Procedure: Right tibia screw removal;  Surgeon: Gaynelle Arabian, MD;  Location: [facility];  Service: Orthopedics;  Laterality: Right;  42mn   I & D EXTREMITY  09/26/2011   Procedure: IRRIGATION AND DEBRIDEMENT EXTREMITY;  Surgeon: KTennis Must MD;  Location: [facility];  Service: Orthopedics;  Laterality: Right;  I&D right hand cat bite wound   ORIF TIBIA PLATEAU Right 11/27/2017   Procedure: OPEN REDUCTION INTERNAL FIXATION (ORIF) TIBIAL PLATEAU FRACTURE;  Surgeon: AGaynelle Arabian MD;  Location: [facility];  Service: Orthopedics;  Laterality: Right;   PORT-A-CATH REMOVAL     PORTACATH PLACEMENT  11/16/2010   placement of left subclavian port NOW REMOVED   right knee torn meniscus surgery      TOTAL HIP ARTHROPLASTY Left 06/13/2018   Procedure: TOTAL HIP ARTHROPLASTY ANTERIOR APPROACH;  Surgeon: AGaynelle Arabian MD;  Location: [facility];  Service: Orthopedics;  Laterality: Left;  1060m   TOTAL HIP ARTHROPLASTY Right 02/02/2022   Procedure: TOTAL HIP ARTHROPLASTY ANTERIOR APPROACH;  Surgeon: AlGaynelle ArabianMD;  Location: [facility];  Service: Orthopedics;  Laterality: Right;   TRANSTHORACIC ECHOCARDIOGRAM  05/28/2012   LVF NORMAL/  EF 55-60%   WISDOM TOOTH EXTRACTION      Family History  Problem Relation Age of Onset   Cancer Maternal Grandfather    Mental illness Brother    Mental illness Son     Social History   Tobacco Use   Smoking status: Every Day    Packs/day: 1.00    Years: 20.00    Total pack years: 20.00    Types: Cigarettes   Smokeless tobacco: Never  Substance Use Topics   Alcohol use: Not Currently    Subjective:   Presents with concerns for abdominal pain x 1 month; feels like  symptoms started after hip replacement surgery; no vomiting but some nausea; no dark stools; was initially having diarrhea after the surgery but has now improved; admits that had been taking increased amounts of Ibuprofen prior to the surgery but has stopped taking in the past few weeks;  "I just want to get my stomach on track." Denies any black stools except when she has taken Pepto Bismol;  Is adamant that she does not want to do any imaging or see the GI;    Objective:  Vitals:   03/22/22 1445  BP: 124/82  Pulse: 78  Resp: 18  Temp: 98.4 F (36.9 C)  TempSrc: Oral  SpO2: 100%  Weight: 136 lb 3.2 oz (61.8 kg)  Height: _0  (1.626 m)    General: Well developed, well nourished, in  no acute distress  Skin : Warm and dry.  Head: Normocephalic and atraumatic  Eyes: Sclera and conjunctiva clear; pupils round and reactive to light; extraocular movements intact  Ears: External normal; canals clear; tympanic membranes normal  Oropharynx: Pink, supple. No suspicious lesions  Neck: Supple without thyromegaly, adenopathy  Lungs: Respirations unlabored; clear to auscultation bilaterally without wheeze, rales, rhonchi  CVS exam: normal rate and regular rhythm.  Abdomen: Soft; nontender; nondistended; normoactive bowel sounds; no masses or hepatosplenomegaly  Neurologic: Alert and oriented; speech intact; face symmetrical; moves all extremities well; CNII-XII intact without focal deficit   Assessment:  1. Abdominal pain, unspecified abdominal location   2. Nausea   3. Urinary frequency   4. Urinary urgency     Plan:  Concern for GERD/ possible ulcer secondary to extended Ibuprofen usage; will re-start Nexium 40 mg daily and short-term use of Sucralfate; patient is adamant today that she does not want imaging or to see GI; check labs today to ensure is not anemic; she does agree to see her PCP in follow up in 2 weeks.   Return in about 2 weeks (around 04/05/2022) for with Dr. Lorelei Pont.   Orders Placed This Encounter  Procedures   Urine Culture   CBC with Differential/Platelet   Comp Met (CMET)   Iron, TIBC and Ferritin Panel   POCT urinalysis dipstick    Requested Prescriptions   Signed Prescriptions Disp Refills   esomeprazole (NEXIUM) 40 MG capsule 30 capsule 0    Sig: Take 1 capsule (40 mg total) by mouth daily at 12 noon.   sucralfate (CARAFATE) 1 g tablet 60 tablet 0    Sig: Take 1 tablet (1 g total) by mouth 4 (four) times daily -  with meals and at bedtime.

## 2022-03-23 LAB — COMPREHENSIVE METABOLIC PANEL
ALT: 35 U/L (ref 0–35)
AST: 33 U/L (ref 0–37)
Albumin: 4.7 g/dL (ref 3.5–5.2)
Alkaline Phosphatase: 100 U/L (ref 39–117)
BUN: 12 mg/dL (ref 6–23)
CO2: 27 mEq/L (ref 19–32)
Calcium: 9.4 mg/dL (ref 8.4–10.5)
Chloride: 103 mEq/L (ref 96–112)
Creatinine, Ser: 0.76 mg/dL (ref 0.40–1.20)
GFR: 95.35 mL/min (ref >60.00)
Glucose, Bld: 82 mg/dL (ref 70–99)
Potassium: 3.7 mEq/L (ref 3.5–5.1)
Sodium: 139 mEq/L (ref 135–145)
Total Bilirubin: 0.3 mg/dL (ref 0.2–1.2)
Total Protein: 7 g/dL (ref 6.0–8.3)

## 2022-03-23 LAB — URINE CULTURE
MICRO NUMBER:: 14218942
Result:: NO GROWTH
SPECIMEN QUALITY:: ADEQUATE

## 2022-03-23 LAB — CBC WITH DIFFERENTIAL/PLATELET
Basophils Absolute: 0.1 10*3/uL (ref 0.0–0.1)
Basophils Relative: 0.9 % (ref 0.0–3.0)
Eosinophils Absolute: 0.1 10*3/uL (ref 0.0–0.7)
Eosinophils Relative: 0.9 % (ref 0.0–5.0)
HCT: 47.6 % — ABNORMAL HIGH (ref 36.0–46.0)
Hemoglobin: 16.5 g/dL — ABNORMAL HIGH (ref 12.0–15.0)
Lymphocytes Relative: 22.8 % (ref 12.0–46.0)
Lymphs Abs: 2.2 10*3/uL (ref 0.7–4.0)
MCHC: 34.6 g/dL (ref 30.0–36.0)
MCV: 96.3 fl (ref 78.0–100.0)
Monocytes Absolute: 0.4 10*3/uL (ref 0.1–1.0)
Monocytes Relative: 4 % (ref 3.0–12.0)
Neutro Abs: 6.8 10*3/uL (ref 1.4–7.7)
Neutrophils Relative %: 71.4 % (ref 43.0–77.0)
Platelets: 281 10*3/uL (ref 150.0–400.0)
RBC: 4.95 Mil/uL (ref 3.87–5.11)
RDW: 12.9 % (ref 11.5–15.5)
WBC: 9.5 10*3/uL (ref 4.0–10.5)

## 2022-03-23 LAB — IRON,TIBC AND FERRITIN PANEL
%SAT: 15 % (calc) — ABNORMAL LOW (ref 16–45)
Ferritin: 71 ng/mL (ref 16–232)
Iron: 56 ug/dL (ref 40–190)
TIBC: 364 mcg/dL (calc) (ref 250–450)

## 2022-04-17 ENCOUNTER — Other Ambulatory Visit: Payer: Self-pay | Admitting: Family

## 2022-04-18 ENCOUNTER — Telehealth: Payer: Self-pay | Admitting: Family

## 2022-04-18 NOTE — Telephone Encounter (Signed)
She needs to schedule a follow up with Dr. Lorelei Pont as discussed at her last OV; unable to continue to give long term refills on the reflux medication.

## 2022-04-19 NOTE — Telephone Encounter (Signed)
Message sent to the pt via mychart 

## 2022-04-21 IMAGING — DX DG CHEST 2V
2 series · 2 of 2 positions shown · non-contrast
Comparison: Chest x-ray dated August 24, 2020

CLINICAL DATA: Persisting cough

EXAM:
CHEST - 2 VIEW

[chest pa]
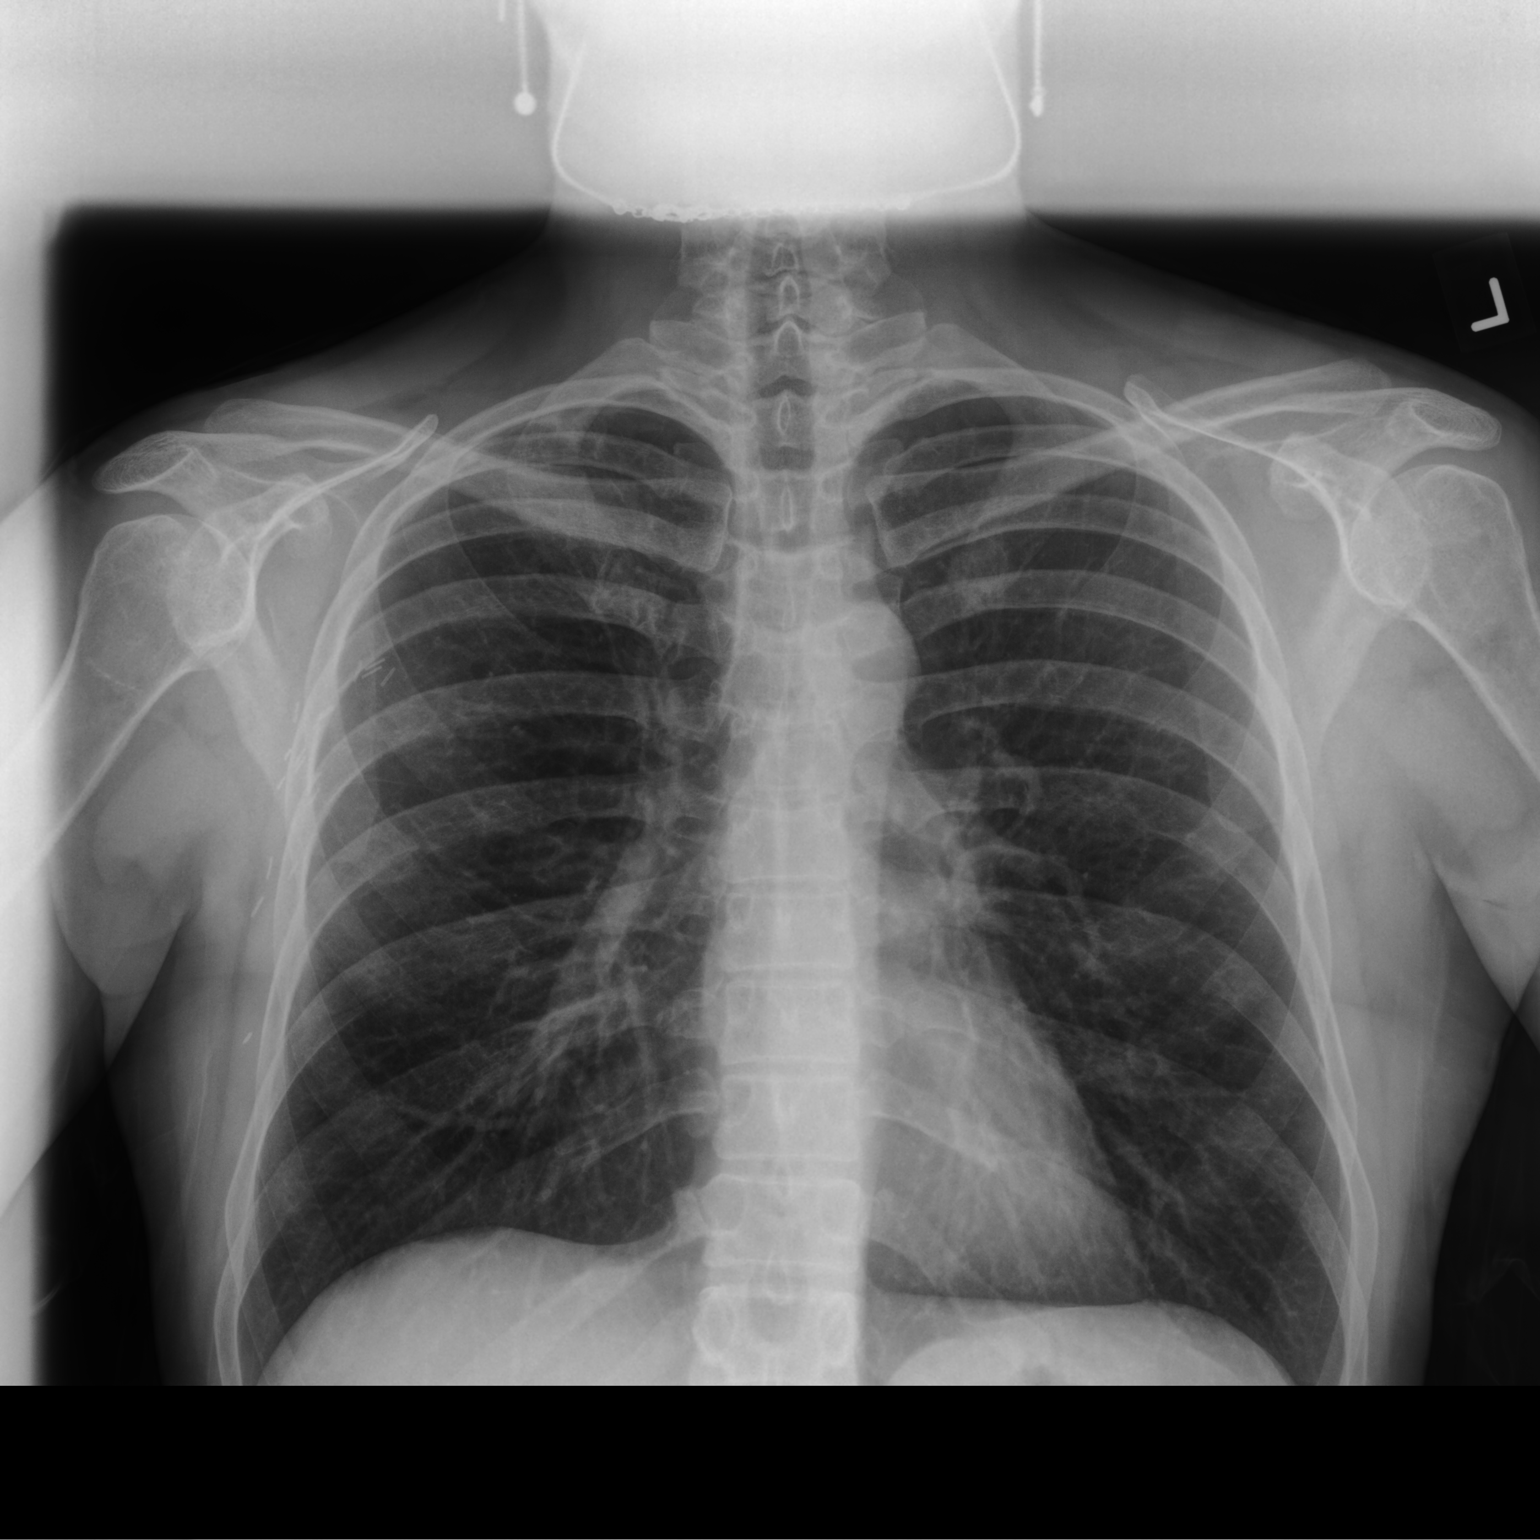

[chest lat]
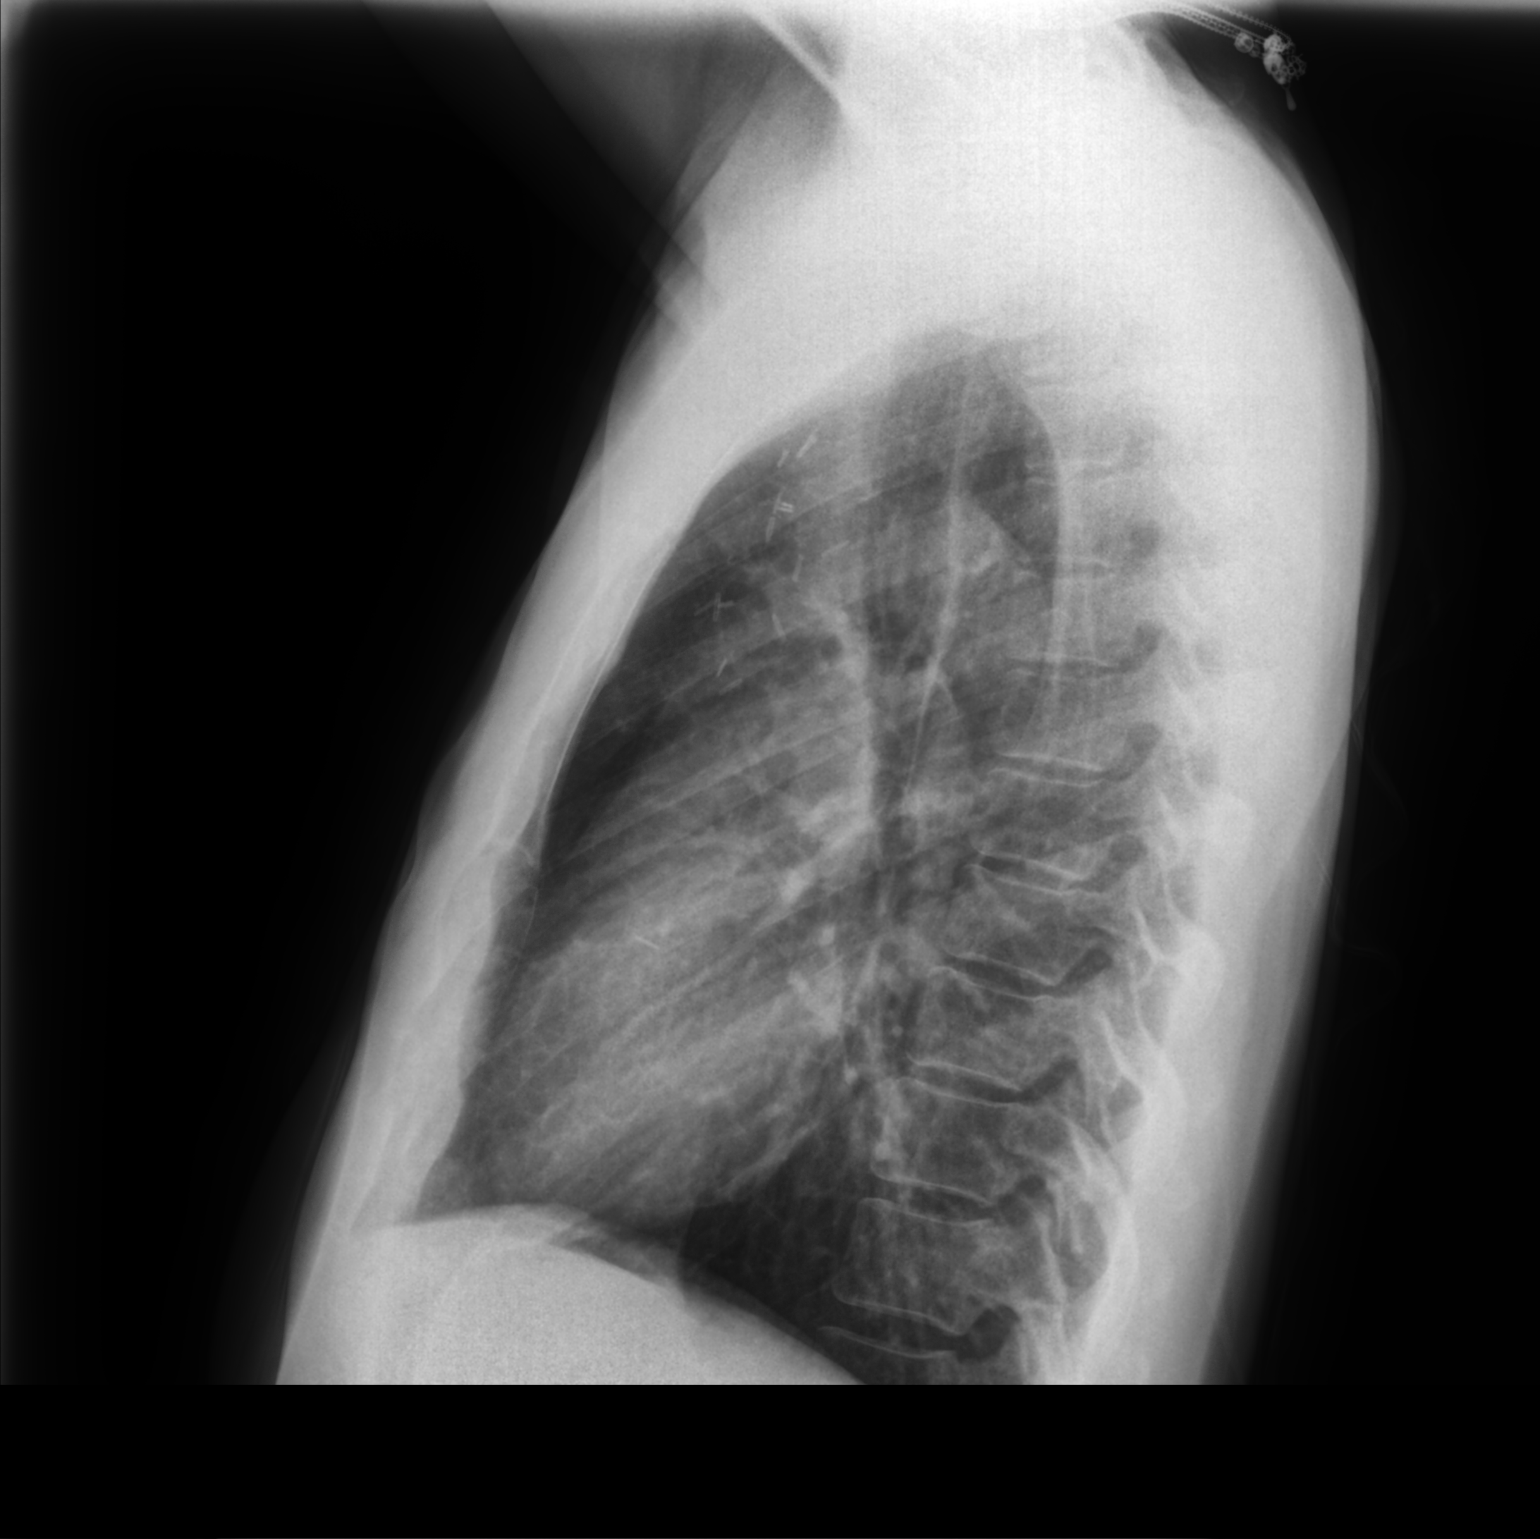

[2 of 2 positions shown; findings below may reference images not displayed]

FINDINGS: Cardiac and mediastinal contours are within normal limits. Unchanged
right apical pleuroparenchymal scarring. Surgical clips noted over
the right axilla. Lungs are clear. No pleural effusion, although the
most inferior portions of the costophrenic angles are excluded from
the field of view. No pneumothorax.
IMPRESSION: No active cardiopulmonary disease.

## 2022-05-20 ENCOUNTER — Other Ambulatory Visit: Payer: Self-pay | Admitting: Nurse Practitioner

## 2022-05-20 DIAGNOSIS — J209 Acute bronchitis, unspecified: Secondary | ICD-10-CM

## 2022-05-25 ENCOUNTER — Telehealth: Payer: Self-pay | Admitting: Family Medicine

## 2022-05-25 NOTE — Telephone Encounter (Signed)
LVM to schedule preventative visit. Requested call back  to schedule.

## 2022-06-14 ENCOUNTER — Encounter (HOSPITAL_BASED_OUTPATIENT_CLINIC_OR_DEPARTMENT_OTHER): Payer: Self-pay | Admitting: Emergency Medicine

## 2022-06-14 ENCOUNTER — Emergency Department (HOSPITAL_BASED_OUTPATIENT_CLINIC_OR_DEPARTMENT_OTHER): Payer: Medicare Other

## 2022-06-14 ENCOUNTER — Emergency Department (HOSPITAL_BASED_OUTPATIENT_CLINIC_OR_DEPARTMENT_OTHER)
Admission: EM | Admit: 2022-06-14 | Discharge: 2022-06-14 | Disposition: A | Discharge status: Home / Self Care | Payer: Medicare Other | Attending: Emergency Medicine | Admitting: Emergency Medicine

## 2022-06-14 ENCOUNTER — Other Ambulatory Visit: Payer: Self-pay

## 2022-06-14 DIAGNOSIS — R1084 Generalized abdominal pain: Secondary | ICD-10-CM | POA: Diagnosis not present

## 2022-06-14 DIAGNOSIS — R350 Frequency of micturition: Secondary | ICD-10-CM | POA: Diagnosis not present

## 2022-06-14 DIAGNOSIS — R0602 Shortness of breath: Secondary | ICD-10-CM | POA: Insufficient documentation

## 2022-06-14 DIAGNOSIS — R112 Nausea with vomiting, unspecified: Secondary | ICD-10-CM

## 2022-06-14 DIAGNOSIS — Z853 Personal history of malignant neoplasm of breast: Secondary | ICD-10-CM | POA: Diagnosis not present

## 2022-06-14 DIAGNOSIS — R42 Dizziness and giddiness: Secondary | ICD-10-CM | POA: Diagnosis not present

## 2022-06-14 DIAGNOSIS — K2900 Acute gastritis without bleeding: Secondary | ICD-10-CM | POA: Diagnosis not present

## 2022-06-14 DIAGNOSIS — E860 Dehydration: Secondary | ICD-10-CM

## 2022-06-14 DIAGNOSIS — R109 Unspecified abdominal pain: Secondary | ICD-10-CM | POA: Diagnosis not present

## 2022-06-14 LAB — URINALYSIS, ROUTINE W REFLEX MICROSCOPIC
Glucose, UA: NEGATIVE mg/dL
Ketones, ur: 80 mg/dL — AB
Leukocytes,Ua: NEGATIVE
Nitrite: NEGATIVE
Protein, ur: NEGATIVE mg/dL
Specific Gravity, Urine: 1.02 (ref 1.005–1.030)
pH: 5.5 (ref 5.0–8.0)

## 2022-06-14 LAB — URINALYSIS, MICROSCOPIC (REFLEX)

## 2022-06-14 LAB — DIFFERENTIAL
Abs Immature Granulocytes: 0.03 10*3/uL (ref 0.00–0.07)
Basophils Absolute: 0 10*3/uL (ref 0.0–0.1)
Basophils Relative: 0 %
Eosinophils Absolute: 0 10*3/uL (ref 0.0–0.5)
Eosinophils Relative: 0 %
Immature Granulocytes: 0 %
Lymphocytes Relative: 16 %
Lymphs Abs: 1.2 10*3/uL (ref 0.7–4.0)
Monocytes Absolute: 0.4 10*3/uL (ref 0.1–1.0)
Monocytes Relative: 5 %
Neutro Abs: 5.9 10*3/uL (ref 1.7–7.7)
Neutrophils Relative %: 79 %

## 2022-06-14 LAB — COMPREHENSIVE METABOLIC PANEL
ALT: 17 U/L (ref 0–44)
AST: 24 U/L (ref 15–41)
Albumin: 4.5 g/dL (ref 3.5–5.0)
Alkaline Phosphatase: 73 U/L (ref 38–126)
Anion gap: 10 (ref 5–15)
BUN: 9 mg/dL (ref 6–20)
CO2: 20 mmol/L — ABNORMAL LOW (ref 22–32)
Calcium: 9.3 mg/dL (ref 8.9–10.3)
Chloride: 104 mmol/L (ref 98–111)
Creatinine, Ser: 0.8 mg/dL (ref 0.44–1.00)
GFR, Estimated: >60 mL/min (ref >60)
Glucose, Bld: 92 mg/dL (ref 70–99)
Potassium: 3.7 mmol/L (ref 3.5–5.1)
Sodium: 134 mmol/L — ABNORMAL LOW (ref 135–145)
Total Bilirubin: 1 mg/dL (ref 0.3–1.2)
Total Protein: 7.9 g/dL (ref 6.5–8.1)

## 2022-06-14 LAB — CBC
HCT: 46.6 % — ABNORMAL HIGH (ref 36.0–46.0)
Hemoglobin: 16.6 g/dL — ABNORMAL HIGH (ref 12.0–15.0)
MCH: 31.7 pg (ref 26.0–34.0)
MCHC: 35.6 g/dL (ref 30.0–36.0)
MCV: 89.1 fL (ref 80.0–100.0)
Platelets: 237 10*3/uL (ref 150–400)
RBC: 5.23 MIL/uL — ABNORMAL HIGH (ref 3.87–5.11)
RDW: 12.2 % (ref 11.5–15.5)
WBC: 7.6 10*3/uL (ref 4.0–10.5)
nRBC: 0 % (ref 0.0–0.2)

## 2022-06-14 LAB — TROPONIN I (HIGH SENSITIVITY)
Troponin I (High Sensitivity): <2 ng/L (ref <18)
Troponin I (High Sensitivity): <2 ng/L (ref <18)

## 2022-06-14 LAB — LIPASE, BLOOD: Lipase: 28 U/L (ref 11–51)

## 2022-06-14 LAB — BRAIN NATRIURETIC PEPTIDE: B Natriuretic Peptide: 17.8 pg/mL (ref 0.0–100.0)

## 2022-06-14 LAB — PREGNANCY, URINE: Preg Test, Ur: NEGATIVE

## 2022-06-14 LAB — CBG MONITORING, ED: Glucose-Capillary: 81 mg/dL (ref 70–99)

## 2022-06-14 MED ORDER — ONDANSETRON 4 MG PO TBDP: 4.0000 mg | ORAL_TABLET | Freq: Three times a day (TID) | ORAL | 0 refills | Status: DC | PRN

## 2022-06-14 MED ORDER — LACTATED RINGERS IV BOLUS
1000.0000 mL | Freq: Once | INTRAVENOUS | Status: AC
  Administered 2022-06-14: 1000 mL via INTRAVENOUS

## 2022-06-14 MED ORDER — DICYCLOMINE HCL 20 MG PO TABS: 20.0000 mg | ORAL_TABLET | Freq: Two times a day (BID) | ORAL | 0 refills | Status: DC | PRN

## 2022-06-14 MED ORDER — IOHEXOL 350 MG/ML SOLN
100.0000 mL | Freq: Once | INTRAVENOUS | Status: AC | PRN
  Administered 2022-06-14: 100 mL via INTRAVENOUS

## 2022-06-14 MED ORDER — HYDROMORPHONE HCL 1 MG/ML IJ SOLN
1.0000 mg | Freq: Once | INTRAMUSCULAR | Status: DC
  Filled 2022-06-14: qty 1

## 2022-06-14 MED ORDER — PROMETHAZINE HCL 25 MG PO TABS: 25.0000 mg | ORAL_TABLET | Freq: Four times a day (QID) | ORAL | 0 refills | Status: DC | PRN

## 2022-06-14 MED ORDER — PANTOPRAZOLE SODIUM 40 MG PO TBEC: 40.0000 mg | DELAYED_RELEASE_TABLET | Freq: Every day | ORAL | 0 refills | Status: DC

## 2022-06-14 MED ORDER — ONDANSETRON HCL 4 MG/2ML IJ SOLN
4.0000 mg | Freq: Once | INTRAMUSCULAR | Status: AC
  Administered 2022-06-14: 4 mg via INTRAVENOUS
  Filled 2022-06-14: qty 2

## 2022-06-14 NOTE — ED Triage Notes (Signed)
Pt here for shob associated w/ abd pain, diarrhea, decreased appetite, diaphoresis and lightheadedness. Pt reports she has had abd problems x3 months, but shob and diarrhea started 3 days.

## 2022-06-14 NOTE — ED Provider Notes (Signed)
[facility] Provider Note   CSN: [id number] Arrival date & time: 06/14/22  1100     History {Add pertinent medical, surgical, social history, OB history to HPI:1} Chief Complaint  Patient presents with   Shortness of Breath   Abdominal Pain    Crystal [name] is a 47 y.o. female.  HPI      Nausea, vomiting for the last 3 weeks Diarrhea for 3 days (on and off for longer but more intense today), yesterday had brb thinks from hemorrhoid, this morning was not  Lightheadedness, no syncope   Began to have urinary frequency prior to hip surgery, would drink coffee then have to urinate 8 times  Pepto bismol, imodium, sucralfate Worsening dyspnea over last week and last night Inhaler not working  No chest  pain, sometimes feels like someone taking the air out Diaphoresis, hot and cold Felt hot at home per partner No vaginal bleeding or discharge   Was taking ibuprofen regularly, prior to hip surgery. Acetaminophen /dayquil    Past Medical History:  Diagnosis Date   Alcohol abuse    Anorexia nervosa    Anxiety    Avascular necrosis of bone of hip ([place])    Bipolar affective disorder, mixed ([place])    Breast cancer ([place])    Right   Chronic pain    Depression    Dilated cardiomyopathy secondary to drug ([place])    DJD (degenerative joint disease)    "BONE STICKS OUT ON LEFT PART OF LOWER BACK"   DVT of upper extremity (deep vein thrombosis) ([place]) 2014   Edema of upper extremity    Family history of anesthesia complication    MOTHER PONV   GERD (gastroesophageal reflux disease)    OCCASIONALLY TAKE ZANTAC   History of breast cancer DX JUNE 2012 W/ RIGHT BREAST INVASIVE DUCTAL CA  STAGE IIB---  S/P BILATERAL MASTECTOMIES AND RIGHT NODE DISSECTION   ONCOLOGIST-- DR Jana Hakim--  CHEMO ENDED [place] 2012 / RADIATION ENDED MAR 2013--  CURRENT ON TAMOXIFEN AND ZOLADEX   History of idiopathic seizure    DX 2008 --  LAST ONE 2012--  NO  ISSUES SINCE AND NO MEDS. -- PT STATES DOCTORS FELT IT WAS STRESS RELATED DUE TO HEALTH ISSUES   Hot flashes due to tamoxifen    Hx MRSA infection 03/28/2011   Skin   Left ovarian cyst    Migraines    Neuropathy    Pneumonia    Seizures ([place])    2016 last seizure    Status post chemotherapy    docetaxel/carboplatin/trastuzumab      Home Medications Prior to Admission medications   Medication Sig Start Date End Date Taking? Authorizing Provider  albuterol (VENTOLIN HFA) 108 (90 Base) MCG/ACT inhaler INHALE 2 PUFFS INTO THE LUNGS EVERY 4 HOURS AS NEEDED FOR WHEEZING OR SHORTNESS OF BREATH. Patient taking differently: Inhale 2 puffs into the lungs as needed for wheezing or shortness of breath. 10/28/21   Cobb, Karie Schwalbe, NP  ALPRAZolam Duanne Moron) 1 MG tablet Take 1 mg by mouth 4 (four) times daily.    [provider]  esomeprazole (NEXIUM) 40 MG capsule Take 1 capsule (40 mg total) by mouth daily at 12 noon. 03/22/22   Marrian Salvage, FNP  Eszopiclone 3 MG TABS Take 3 mg by mouth at bedtime. Take immediately before bedtime    [provider]  ipratropium-albuterol (DUONEB) 0.5-2.5 (3) MG/3ML SOLN Take 3 mLs by nebulization every 6 (  six) hours as needed. Patient taking differently: Take 3 mLs by nebulization as needed (shortness of breath/wheezing). 01/07/21   Copland, Gay Filler, MD  sucralfate (CARAFATE) 1 g tablet Take 1 tablet (1 g total) by mouth 4 (four) times daily -  with meals and at bedtime. 03/22/22   Marrian Salvage, FNP      Allergies    Thorazine [chlorpromazine hcl], Tramadol, Phenytoin, Xarelto [rivaroxaban], Ciprofloxacin, and Vancomycin    Review of Systems   Review of Systems  Physical Exam Updated Vital Signs BP (!) 130/98 (BP Location: Left Arm)   Pulse (!) 112   Temp 98.7 F (37.1 C) (Oral)   Resp 18   LMP 05/16/2022 (Approximate)   SpO2 100%  Physical Exam  ED Results / Procedures / Treatments   Labs (all labs ordered are  listed, but only abnormal results are displayed) Labs Reviewed  LIPASE, BLOOD  COMPREHENSIVE METABOLIC PANEL  CBC  URINALYSIS, ROUTINE W REFLEX MICROSCOPIC  PREGNANCY, URINE    EKG None  Radiology No results found.  Procedures Procedures  {Document cardiac monitor, telemetry assessment procedure when appropriate:1}  Medications Ordered in ED Medications - No data to display  ED Course/ Medical Decision Making/ A&P   {   Click here for ABCD2, HEART and other calculatorsREFRESH Note before signing :1}                          Medical Decision Making Amount and/or Complexity of Data Reviewed Labs: ordered. Radiology: ordered.  Risk Prescription drug management.   ***  {Document critical care time when appropriate:1} {Document review of labs and clinical decision tools ie heart score, Chads2Vasc2 etc:1}  {Document your independent review of radiology images, and any outside records:1} {Document your discussion with family members, caretakers, and with consultants:1} {Document social determinants of health affecting pt's care:1} {Document your decision making why or why not admission, treatments were needed:1} Final Clinical Impression(s) / ED Diagnoses Final diagnoses:  None    Rx / DC Orders ED Discharge Orders     None

## 2022-06-20 ENCOUNTER — Encounter: Payer: Self-pay | Admitting: Gastroenterology

## 2022-06-20 ENCOUNTER — Other Ambulatory Visit: Payer: Medicare Other

## 2022-06-20 ENCOUNTER — Ambulatory Visit: Payer: Medicare Other | Admitting: Gastroenterology

## 2022-06-20 VITALS — BP 118/80 | HR 107 | Ht 64.0 in | Wt 133.0 lb

## 2022-06-20 DIAGNOSIS — K921 Melena: Secondary | ICD-10-CM

## 2022-06-20 DIAGNOSIS — R140 Abdominal distension (gaseous): Secondary | ICD-10-CM

## 2022-06-20 DIAGNOSIS — L290 Pruritus ani: Secondary | ICD-10-CM | POA: Diagnosis not present

## 2022-06-20 DIAGNOSIS — R197 Diarrhea, unspecified: Secondary | ICD-10-CM

## 2022-06-20 DIAGNOSIS — R12 Heartburn: Secondary | ICD-10-CM

## 2022-06-20 DIAGNOSIS — R1013 Epigastric pain: Secondary | ICD-10-CM

## 2022-06-20 DIAGNOSIS — K5900 Constipation, unspecified: Secondary | ICD-10-CM | POA: Diagnosis not present

## 2022-06-20 MED ORDER — CLENPIQ 10-3.5-12 MG-GM -GM/175ML PO SOLN: 1.0000 | Freq: Once | ORAL | 0 refills | Status: AC

## 2022-06-20 NOTE — Patient Instructions (Addendum)
Your provider has requested that you go to the basement level for lab work before leaving today. Press "B" on the elevator. The lab is located at the first door on the left as you exit the elevator.   You have been scheduled for an endoscopy and colonoscopy. Please follow the written instructions given to you at your visit today. Please pick up your prep supplies at the pharmacy within the next 1-3 days. If you use inhalers (even only as needed), please bring them with you on the day of your procedure.   _______________________________________________________  If your blood pressure at your visit was 140/90 or greater, please contact your primary care physician to follow up on this.  _______________________________________________________  If you are age 70 or younger, your body mass index should be between 19-25. Your Body mass index is 22.83 kg/m. If this is out of the aformentioned range listed, please consider follow up with your Primary Care Provider.   ________________________________________________________  The [facility] providers would like to encourage you to use [facility] to communicate with providers for non-urgent requests or questions.  Due to long hold times on the telephone, sending your provider a message by [facility] may be a faster and more efficient way to get a response.  Please allow 48 business hours for a response.  Please remember that this is for non-urgent requests.  _______________________________________________________   Due to recent changes in healthcare laws, you may see the results of your imaging and laboratory studies on MyChart before your provider has had a chance to review them.  We understand that in some cases there may be results that are confusing or concerning to you. Not all laboratory results come back in the same time frame and the provider may be waiting for multiple results in order to interpret others.  Please give [place] 48 hours in order for your  provider to thoroughly review all the results before contacting the office for clarification of your results.    Thank you for choosing me and [facility].  Vito Cirigliano, D.O.

## 2022-06-20 NOTE — Progress Notes (Signed)
Chief Complaint: Diarrhea, change in bowel habits, GERD, heartburn, hematochezia, nausea   Referring Provider:     Darreld Mclean, MD   HPI:     Crystal [name] is a 47 y.o. female with a history of bipolar, anxiety, EtOH use disorder, right breast cancer s/p b/l mastectomy, chemo, and radiation 2012-13, dilated cardiomyopathy, avascular necrosis s/p bilateral hip replacement, tobacco use disorder, referred to the [facility] for evaluation of multiple GI symptoms, to include abdominal pain, nausea, change in bowel habits, abdominal bloating, visible abdominal distention.  She presents to the office today with her husband.  She has some chronic GI symptoms to include chronic constipation with hard, pellet-like stools and straining to have BM, but more recently also with diarrhea and fecal leakage.  Also with intermittent epigastric pain and episodic postprandial abdominal distention and bloating.  NSAIDs for at least 1 year due to avascular necrosis, but has reduced NSAIDs since right hip replacement on 02/02/2022 (had left hip replacement prior).  Separately, intermittent small-volume BRBPR that she has attributed to hemorrhoids asked.  Also with pruritus ani and rectal irritation.  No bleeding in the last week or so.  She was seen by his PCM for this issue on 03/22/2022 restarted Nexium 40 g daily course of sucralfate.   She was seen in the ER 06/14/2022 with nausea/vomiting x 3 weeks, lightheadedness, diarrhea x 3 days with 8-10 stools/day, and possible symptomatic hemorrhoids.  Had first trialed Pepto-Bismol, Imodium, sucralfate at home.  Exam with generalized abdominal tenderness but no guarding. - H/H 16.6/46.6 (about baseline) with normal WBC and PLT - NA 134, CO2 20, otherwise normal CMP - Normal lipase, troponin - CT A/P: Normal - CT PE: Normal, no PE - Discharged with Protonix with improvement in GERD sxs  Quit EtOH a couple weeks ago.  Reports that  she continues to smoke since cigarettes tend to induce BM.  Past Medical History:  Diagnosis Date   Alcohol abuse    Anorexia nervosa    Anxiety    Avascular necrosis of bone of hip ([place])    Bipolar affective disorder, mixed ([place])    Breast cancer ([place])    Right   Chronic pain    Depression    Dilated cardiomyopathy secondary to drug ([facility])    DJD (degenerative joint disease)    "BONE STICKS OUT ON LEFT PART OF LOWER BACK"   DVT of upper extremity (deep vein thrombosis) ([place]) 2014   Edema of upper extremity    Family history of anesthesia complication    MOTHER PONV   GERD (gastroesophageal reflux disease)    OCCASIONALLY TAKE ZANTAC   History of breast cancer DX JUNE 2012 W/ RIGHT BREAST INVASIVE DUCTAL CA  STAGE IIB---  S/P BILATERAL MASTECTOMIES AND RIGHT NODE DISSECTION   ONCOLOGIST-- DR Jana Hakim--  CHEMO ENDED [place] 2012 / RADIATION ENDED MAR 2013--  CURRENT ON TAMOXIFEN AND ZOLADEX   History of idiopathic seizure    DX 2008 --  LAST ONE 2012--  NO ISSUES SINCE AND NO MEDS. -- PT STATES DOCTORS FELT IT WAS STRESS RELATED DUE TO HEALTH ISSUES   Hot flashes due to tamoxifen    Hx MRSA infection 03/28/2011   Skin   Left ovarian cyst    Migraines    Neuropathy    Pneumonia    Seizures ([place])    2016 last seizure    Status post chemotherapy  docetaxel/carboplatin/trastuzumab     Past Surgical History:  Procedure Laterality Date   BIOPSY BREAST  10/14/2010   right needle core biopsy   BREAST SURGERY  11/16/2010   bilateral mastectomy+ right axillary node dissection,T1cN1a, Her2+,ERPR+  (right breast invasive ductal carcinoma)   CYSTO WITH HYDRODISTENSION N/A 07/23/2012   Procedure: CYSTOSCOPY/HYDRODISTENSION instillation of marcaine and pyridium;  Surgeon: Reece Packer, MD;  Location: [facility];  Service: Urology;  Laterality: N/A;  INSTILLATION     HARDWARE REMOVAL Right 12/03/2018   Procedure: Right tibia screw removal;  Surgeon: Gaynelle Arabian, MD;  Location: [facility];  Service: Orthopedics;  Laterality: Right;  36mn   I & D EXTREMITY  09/26/2011   Procedure: IRRIGATION AND DEBRIDEMENT EXTREMITY;  Surgeon: KTennis Must MD;  Location: [facility];  Service: Orthopedics;  Laterality: Right;  I&D right hand cat bite wound   ORIF TIBIA PLATEAU Right 11/27/2017   Procedure: OPEN REDUCTION INTERNAL FIXATION (ORIF) TIBIAL PLATEAU FRACTURE;  Surgeon: AGaynelle Arabian MD;  Location: [facility];  Service: Orthopedics;  Laterality: Right;   PORT-A-CATH REMOVAL     PORTACATH PLACEMENT  11/16/2010   placement of left subclavian port NOW REMOVED   right knee torn meniscus surgery      TOTAL HIP ARTHROPLASTY Left 06/13/2018   Procedure: TOTAL HIP ARTHROPLASTY ANTERIOR APPROACH;  Surgeon: AGaynelle Arabian MD;  Location: [facility];  Service: Orthopedics;  Laterality: Left;  1023m   TOTAL HIP ARTHROPLASTY Right 02/02/2022   Procedure: TOTAL HIP ARTHROPLASTY ANTERIOR APPROACH;  Surgeon: AlGaynelle ArabianMD;  Location: [facility];  Service: Orthopedics;  Laterality: Right;   TRANSTHORACIC ECHOCARDIOGRAM  05/28/2012   LVF NORMAL/  EF 55-60%   WISDOM TOOTH EXTRACTION     Family History  Problem Relation Age of Onset   Mental illness Brother    Cancer Maternal Grandfather    Mental illness Son    Stomach cancer Neg Hx    Esophageal cancer Neg Hx    Colon cancer Neg Hx    Social History   Tobacco Use   Smoking status: Every Day    Packs/day: 1.00    Years: 20.00    Total pack years: 20.00    Types: Cigarettes   Smokeless tobacco: Never  Vaping Use   Vaping Use: Never used  Substance Use Topics   Alcohol use: Not Currently   Drug use: No    Comment: LAST USED COCAINE AND MARIJIUANA 2011   Current Outpatient Medications  Medication Sig Dispense Refill   albuterol (VENTOLIN HFA) 108 (90 Base) MCG/ACT inhaler INHALE 2 PUFFS INTO THE LUNGS EVERY 4 HOURS AS NEEDED FOR WHEEZING OR SHORTNESS OF BREATH. (Patient taking differently: Inhale 2 puffs into the  lungs as needed for wheezing or shortness of breath.) 18 each 2   ALPRAZolam (XANAX) 1 MG tablet Take 1 mg by mouth 4 (four) times daily.     pantoprazole (PROTONIX) 40 MG tablet Take 1 tablet (40 mg total) by mouth daily for 14 days. 14 tablet 0   dicyclomine (BENTYL) 20 MG tablet Take 1 tablet (20 mg total) by mouth 2 (two) times daily as needed for spasms (abdominal pain). (Patient not taking: Reported on 06/20/2022) 20 tablet 0   Eszopiclone 3 MG TABS Take 3 mg by mouth at bedtime. Take immediately before bedtime (Patient not taking: Reported on 06/20/2022)     ipratropium-albuterol (DUONEB) 0.5-2.5 (3) MG/3ML SOLN Take 3 mLs by nebulization every 6 (six) hours as needed. (Patient not  taking: Reported on 06/20/2022) 360 mL 1   ondansetron (ZOFRAN-ODT) 4 MG disintegrating tablet Take 1 tablet (4 mg total) by mouth every 8 (eight) hours as needed for nausea or vomiting (take this OR phenergan (do not take both.)). (Patient not taking: Reported on 06/20/2022) 20 tablet 0   promethazine (PHENERGAN) 25 MG tablet Take 1 tablet (25 mg total) by mouth every 6 (six) hours as needed for nausea or vomiting. (Patient not taking: Reported on 06/20/2022) 30 tablet 0   sucralfate (CARAFATE) 1 g tablet Take 1 tablet (1 g total) by mouth 4 (four) times daily -  with meals and at bedtime. (Patient not taking: Reported on 06/20/2022) 60 tablet 0   No current facility-administered medications for this visit.   Allergies  Allergen Reactions   Thorazine [Chlorpromazine Hcl] Hives   Tramadol Other (See Comments)    Seizures   Phenytoin Nausea And Vomiting   Xarelto [Rivaroxaban]    Ciprofloxacin Nausea And Vomiting   Vancomycin Rash and Other (See Comments)    Possible rash reported by MD     Review of Systems: All systems reviewed and negative except where noted in HPI.     Physical Exam:    Wt Readings from Last 3 Encounters:  06/20/22 133 lb (60.3 kg)  03/22/22 136 lb 3.2 oz (61.8 kg)  02/02/22 136 lb  6.4 oz (61.9 kg)    BP 118/80   Pulse (!) 107   Ht 5' 4"$  (1.626 m)   Wt 133 lb (60.3 kg)   LMP 05/16/2022 (Approximate)   BMI 22.83 kg/m  Constitutional:  Pleasant, in no acute distress. Cardiovascular: Normal rate, regular rhythm. No edema Pulmonary/chest: Effort normal and breath sounds normal. No wheezing, rales or rhonchi. Abdominal: Soft, nondistended, nontender. Bowel sounds active throughout. There are no masses palpable. No hepatomegaly. Neurological: Alert and oriented to person place and time. Skin: Skin is warm and dry. No rashes noted.   ASSESSMENT AND PLAN;   1) Change in bowel habits 2) Constipation 3) Diarrhea 4) Abdominal pain 5) Abdominal bloating 6) Visible abdominal distention  She has multiple GI symptoms that are chronic in nature, but also more recent symptoms that are difficult to tell if these are exacerbations versus superimposed.  I suspect she has chronic constipation with episodic overflow diarrhea, but more recently possibly infectious diarrhea that she went to the ER for (or possibly withdrawal after quitting EtOH).  Discussed very thoroughly DDx to include IBD, IBS, MC, PUD, factious, etc. with the following plan:  - EGD to evaluate for mucosal/luminal pathology to include PUD, gastritis, infectious, small bowel pathology - Colonoscopy to evaluate for IBD, MC, infectious, etc. - Check fecal calprotectin, pancreatic elastase, fecal fat - Recent lab in ER were otherwise largely unremarkable as well as normal CT A/P - Plan to add Dulcolax to bowel prep regimen longstanding history of constipation  7) Hematochezia 8) Pruritus ani - Evaluate for clinically significant hemorrhoids, skin tags, etc. at time of colonoscopy.  The meantime, instructed on appropriate hygiene  9) GERD 10) Heartburn - Reports longstanding history of GERD, currently treated with Protonix - Evaluate for erosive esophagitis, LES laxity, hiatal hernia time of EGD  11) EtOH use  disorder - Congratulated her on recently quitting all EtOH for the last couple of weeks  12) Tobacco use disorder - Discussed overall benefits of smoking cessation   The indications, risks, and benefits of EGD and colonoscopy were explained to the patient in detail. Risks include but are  not limited to bleeding, perforation, adverse reaction to medications, and cardiopulmonary compromise. Sequelae include but are not limited to the possibility of surgery, hospitalization, and mortality. The patient verbalized understanding and wished to proceed. All questions answered, referred to scheduler and bowel prep ordered. Further recommendations pending results of the exam.     Lavena Bullion, DO, FACG  06/20/2022, 3:31 PM   Copland, Gay Filler, MD

## 2022-06-21 ENCOUNTER — Other Ambulatory Visit: Payer: Medicare Other

## 2022-06-21 DIAGNOSIS — R1013 Epigastric pain: Secondary | ICD-10-CM

## 2022-06-21 DIAGNOSIS — K5900 Constipation, unspecified: Secondary | ICD-10-CM

## 2022-06-21 DIAGNOSIS — R197 Diarrhea, unspecified: Secondary | ICD-10-CM

## 2022-06-21 DIAGNOSIS — K921 Melena: Secondary | ICD-10-CM

## 2022-06-21 DIAGNOSIS — A09 Infectious gastroenteritis and colitis, unspecified: Secondary | ICD-10-CM | POA: Diagnosis not present

## 2022-06-23 ENCOUNTER — Telehealth: Payer: Self-pay

## 2022-06-23 LAB — GI PROFILE, STOOL, PCR

## 2022-06-23 MED ORDER — FIDAXOMICIN 200 MG PO TABS: 200.0000 mg | ORAL_TABLET | Freq: Two times a day (BID) | ORAL | 0 refills | Status: DC

## 2022-06-23 NOTE — Telephone Encounter (Signed)
Difficid 200 mg is sent to patient's pharmacy. Contacted patient and LVM to return the call to notify result and Dr. Vivia Ewing recommendations.

## 2022-06-23 NOTE — Telephone Encounter (Signed)
-----   Message from Bran[place], DO sent at 06/23/2022 12:55 PM EST ----- GI PCR panel notable for C difficile+ toxin. Plan to treat as follows: - Vancomycin 125 mg PO QID x10 days - Start probiotic, and continue at least 4 weeks beyond completion of Vancomycin course - Will follow-up on the other pending labs when those have resulted.

## 2022-06-23 NOTE — Telephone Encounter (Signed)
Attempted to call patient again. Sent Detailed MyChart message to patient.

## 2022-06-24 ENCOUNTER — Telehealth: Payer: Self-pay | Admitting: Gastroenterology

## 2022-06-24 MED ORDER — VANCOMYCIN HCL 125 MG PO CAPS: 125.0000 mg | ORAL_CAPSULE | Freq: Four times a day (QID) | ORAL | 0 refills | Status: AC

## 2022-06-24 NOTE — Telephone Encounter (Signed)
Spoke with the patient.Stated Difficid was $1800 out of pocket and would like to change her med. Patient reported she doesn't have allergic reaction with Vancomycin in the past. Stated  when she was hospitalized years ago, she had MRSA. She was treated with IV Vancomycin that made her face red but she didn't not had Rash or any other allergic reaction with it.  After consulting with Dr, Bryan Lemma, Vancomycin 125 mg is sent to the pharmacy.

## 2022-06-24 NOTE — Telephone Encounter (Signed)
Patient called back. Made her aware of Dr. Vivia Ewing recommendation.

## 2022-06-24 NOTE — Telephone Encounter (Signed)
Called patient back. See telephone encounter from 06/23/21.

## 2022-06-24 NOTE — Telephone Encounter (Signed)
Inbound call from patient stating that her insurance will not cover Dificid. Patient is requesting a call back to discuss alternative medications. Please advise.

## 2022-06-24 NOTE — Addendum Note (Signed)
Addended by: Howell Pringle on: 06/24/2022 04:40 PM   Modules accepted: Orders

## 2022-06-25 LAB — CALPROTECTIN, FECAL: Calprotectin, Fecal: 36 ug/g (ref 0–120)

## 2022-06-29 LAB — PANCREATIC ELASTASE, FECAL: Pancreatic Elastase-1, Stool: >500 mcg/g

## 2022-07-11 ENCOUNTER — Other Ambulatory Visit: Payer: Self-pay

## 2022-07-11 ENCOUNTER — Telehealth: Payer: Self-pay | Admitting: *Deleted

## 2022-07-11 ENCOUNTER — Emergency Department (HOSPITAL_BASED_OUTPATIENT_CLINIC_OR_DEPARTMENT_OTHER)
Admission: EM | Admit: 2022-07-11 | Discharge: 2022-07-12 | Disposition: A | Discharge status: Home / Self Care | Payer: Medicare Other | Attending: Emergency Medicine | Admitting: Emergency Medicine

## 2022-07-11 ENCOUNTER — Emergency Department (HOSPITAL_BASED_OUTPATIENT_CLINIC_OR_DEPARTMENT_OTHER): Payer: Medicare Other

## 2022-07-11 DIAGNOSIS — Z853 Personal history of malignant neoplasm of breast: Secondary | ICD-10-CM | POA: Diagnosis not present

## 2022-07-11 DIAGNOSIS — R197 Diarrhea, unspecified: Secondary | ICD-10-CM | POA: Diagnosis not present

## 2022-07-11 DIAGNOSIS — R110 Nausea: Secondary | ICD-10-CM | POA: Diagnosis not present

## 2022-07-11 DIAGNOSIS — R410 Disorientation, unspecified: Secondary | ICD-10-CM | POA: Diagnosis not present

## 2022-07-11 DIAGNOSIS — R531 Weakness: Secondary | ICD-10-CM | POA: Diagnosis not present

## 2022-07-11 DIAGNOSIS — E860 Dehydration: Secondary | ICD-10-CM | POA: Diagnosis not present

## 2022-07-11 LAB — CBC WITH DIFFERENTIAL/PLATELET
Abs Immature Granulocytes: 0.03 10*3/uL (ref 0.00–0.07)
Basophils Absolute: 0 10*3/uL (ref 0.0–0.1)
Basophils Relative: 0 %
Eosinophils Absolute: 0 10*3/uL (ref 0.0–0.5)
Eosinophils Relative: 0 %
HCT: 44.8 % (ref 36.0–46.0)
Hemoglobin: 16.2 g/dL — ABNORMAL HIGH (ref 12.0–15.0)
Immature Granulocytes: 0 %
Lymphocytes Relative: 13 %
Lymphs Abs: 1.3 10*3/uL (ref 0.7–4.0)
MCH: 32.4 pg (ref 26.0–34.0)
MCHC: 36.2 g/dL — ABNORMAL HIGH (ref 30.0–36.0)
MCV: 89.6 fL (ref 80.0–100.0)
Monocytes Absolute: 0.5 10*3/uL (ref 0.1–1.0)
Monocytes Relative: 5 %
Neutro Abs: 7.9 10*3/uL — ABNORMAL HIGH (ref 1.7–7.7)
Neutrophils Relative %: 82 %
Platelets: 217 10*3/uL (ref 150–400)
RBC: 5 MIL/uL (ref 3.87–5.11)
RDW: 12.4 % (ref 11.5–15.5)
WBC: 9.7 10*3/uL (ref 4.0–10.5)
nRBC: 0 % (ref 0.0–0.2)

## 2022-07-11 LAB — URINALYSIS, ROUTINE W REFLEX MICROSCOPIC
Bilirubin Urine: NEGATIVE
Glucose, UA: NEGATIVE mg/dL
Ketones, ur: 80 mg/dL — AB
Leukocytes,Ua: NEGATIVE
Nitrite: NEGATIVE
Protein, ur: NEGATIVE mg/dL
Specific Gravity, Urine: <=1.005 (ref 1.005–1.030)
pH: 5.5 (ref 5.0–8.0)

## 2022-07-11 LAB — TROPONIN I (HIGH SENSITIVITY): Troponin I (High Sensitivity): 2 ng/L (ref <18)

## 2022-07-11 LAB — COMPREHENSIVE METABOLIC PANEL
ALT: 23 U/L (ref 0–44)
AST: 24 U/L (ref 15–41)
Albumin: 4.4 g/dL (ref 3.5–5.0)
Alkaline Phosphatase: 69 U/L (ref 38–126)
Anion gap: 12 (ref 5–15)
BUN: 9 mg/dL (ref 6–20)
CO2: 16 mmol/L — ABNORMAL LOW (ref 22–32)
Calcium: 8.7 mg/dL — ABNORMAL LOW (ref 8.9–10.3)
Chloride: 106 mmol/L (ref 98–111)
Creatinine, Ser: 0.79 mg/dL (ref 0.44–1.00)
GFR, Estimated: >60 mL/min (ref >60)
Glucose, Bld: 86 mg/dL (ref 70–99)
Potassium: 3.7 mmol/L (ref 3.5–5.1)
Sodium: 134 mmol/L — ABNORMAL LOW (ref 135–145)
Total Bilirubin: 0.9 mg/dL (ref 0.3–1.2)
Total Protein: 7.3 g/dL (ref 6.5–8.1)

## 2022-07-11 LAB — URINALYSIS, MICROSCOPIC (REFLEX)

## 2022-07-11 LAB — LIPASE, BLOOD: Lipase: 24 U/L (ref 11–51)

## 2022-07-11 LAB — PREGNANCY, URINE: Preg Test, Ur: NEGATIVE

## 2022-07-11 LAB — MAGNESIUM: Magnesium: 2 mg/dL (ref 1.7–2.4)

## 2022-07-11 MED ORDER — SODIUM CHLORIDE 0.9 % IV SOLN
12.5000 mg | Freq: Four times a day (QID) | INTRAVENOUS | Status: DC | PRN
  Administered 2022-07-11: 12.5 mg via INTRAVENOUS
  Filled 2022-07-11: qty 0.5

## 2022-07-11 MED ORDER — LACTATED RINGERS IV BOLUS
1000.0000 mL | Freq: Once | INTRAVENOUS | Status: AC
  Administered 2022-07-11: 1000 mL via INTRAVENOUS

## 2022-07-11 MED ORDER — ONDANSETRON 4 MG PO TBDP
4.0000 mg | ORAL_TABLET | Freq: Once | ORAL | Status: AC
  Administered 2022-07-11: 4 mg via ORAL
  Filled 2022-07-11: qty 1

## 2022-07-11 MED ORDER — PROMETHAZINE HCL 25 MG/ML IJ SOLN
INTRAMUSCULAR | Status: AC
  Filled 2022-07-11: qty 1

## 2022-07-11 MED ORDER — PROMETHAZINE HCL 25 MG PO TABS: 25.0000 mg | ORAL_TABLET | Freq: Four times a day (QID) | ORAL | 0 refills | Status: DC | PRN

## 2022-07-11 MED ORDER — ONDANSETRON HCL 4 MG PO TABS: 4.0000 mg | ORAL_TABLET | Freq: Four times a day (QID) | ORAL | 0 refills | Status: DC

## 2022-07-11 MED ORDER — HYDROXYZINE HCL 25 MG PO TABS: 25.0000 mg | ORAL_TABLET | Freq: Four times a day (QID) | ORAL | 0 refills | Status: DC

## 2022-07-11 MED ORDER — PANTOPRAZOLE SODIUM 40 MG IV SOLR
40.0000 mg | Freq: Once | INTRAVENOUS | Status: AC
  Administered 2022-07-11: 40 mg via INTRAVENOUS
  Filled 2022-07-11: qty 10

## 2022-07-11 MED ORDER — PANTOPRAZOLE SODIUM 20 MG PO TBEC: 20.0000 mg | DELAYED_RELEASE_TABLET | Freq: Every day | ORAL | 0 refills | Status: DC

## 2022-07-11 NOTE — Discharge Instructions (Addendum)
You are seen emergency department today for weakness and nausea.  As we discussed your lab work showed that you are fairly dehydrated.  We have given you 2 L of fluids through your IV.  We have also given you medications including Zofran, Phenergan, and Protonix.  I am writing you prescriptions for these things, as well as a medicine called hydroxyzine, which could help with some of your stomach discomfort and help you sleep.  Your C. difficile test was sent.  It should result at some point tomorrow.  You can follow-up the results on MyChart and talk to your GI doctor about the results.  I also recommend talking to your counselor about your worries regarding your help. If you at any time feel you need urgent psychiatric evaluation, return to the ER immediately.   Continue to monitor how you're doing and return to the ER for new or worsening symptoms.

## 2022-07-11 NOTE — ED Notes (Signed)
Pt up to commode 

## 2022-07-11 NOTE — ED Notes (Signed)
Pt has been experiencing weakness x 2 wks Dx with c.diff 2 weeks ago was scheduled for endoscopy and colonoscopy for tomorrow and MD cancelled it this afternoon. Pt is confused, oriented to date and place but depends on spouse to tell her medical hx.

## 2022-07-11 NOTE — ED Notes (Signed)
Green top resent

## 2022-07-11 NOTE — Telephone Encounter (Signed)
Spouse called the [facility] with information regarding his wife who was scheduled for procedure tomorrow with Dr. Bryan Lemma. PT is weak, nauseated, slurred speech. Will cancel the procedure and instructed them to go to the ED. Encouraged husband to let us know how the patient is tomorrow and will reschedule when she is feeling better. Will notify Dr. Bryan Lemma.

## 2022-07-11 NOTE — ED Notes (Signed)
Patient resting quietly in stretcher, respirations even, unlabored, no acute distress noted. Denies needs at this time.  Visitor at bedside. PA evaluating patient at this time. Fluids continue to infuse by gravity.

## 2022-07-11 NOTE — ED Notes (Signed)
Patient transported to CT 

## 2022-07-11 NOTE — ED Triage Notes (Signed)
Pt arrives with c/o generalized weakness that started in the last 2 weeks. Pt was diagnosed with c.diff 2 weeks ago. Per husband, pt has been abel to eat or drink normally for at least a week. Per husband, pt is having some hallucinations. Pt endorses some confusion and not feeling herself.

## 2022-07-11 NOTE — ED Provider Notes (Signed)
EMERGENCY DEPARTMENT AT [facility] Provider Note   CSN: [id number] Arrival date & time: 07/11/22  1759     History  Chief Complaint  Patient presents with   Weakness    Crystal [name] is a 47 y.o. female with history of breast cancer s/p bilateral mastectomy, anxiety, bipolar affective disorder, GERD, depression, degenerative joint disease, neuropathy, dilated cardiomyopathy secondary to chemotherapy, DVT in 2014, avascular necrosis of hip, chronic pain who presents the emergency department with concern for generalized weakness in the past 2 weeks.  Patient was recently diagnosed with C. difficile infection.  Husband reports that patient has been able to eat and drink normally for at least a week, but is recently started experiencing some hallucinations.  Patient states she has felt confused and overall does "not feel like herself".  Patient diagnosed with C. difficile based on labs performed at GI on 2/20.  Was initially prescribed vancomycin had medication changes due to insurance.  Husband called the GI office today where she was post to have an endoscopy performed, they recommended that she go to the ER.   Weakness      Home Medications Prior to Admission medications   Medication Sig Start Date End Date Taking? Authorizing Provider  hydrOXYzine (ATARAX) 25 MG tablet Take 1 tablet (25 mg total) by mouth every 6 (six) hours. 07/11/22  Yes Othel Hoogendoorn T, PA-C  ondansetron (ZOFRAN) 4 MG tablet Take 1 tablet (4 mg total) by mouth every 6 (six) hours. 07/11/22  Yes Afua Hoots T, PA-C  pantoprazole (PROTONIX) 20 MG tablet Take 1 tablet (20 mg total) by mouth daily. 07/11/22  Yes Peterson Mathey T, PA-C  promethazine (PHENERGAN) 25 MG tablet Take 1 tablet (25 mg total) by mouth every 6 (six) hours as needed for nausea or vomiting. 07/11/22  Yes Travonne Schowalter T, PA-C  albuterol (VENTOLIN HFA) 108 (90 Base) MCG/ACT inhaler INHALE 2 PUFFS INTO THE LUNGS EVERY  4 HOURS AS NEEDED FOR WHEEZING OR SHORTNESS OF BREATH. Patient taking differently: Inhale 2 puffs into the lungs as needed for wheezing or shortness of breath. 10/28/21   Cobb, Karie Schwalbe, NP  ALPRAZolam Duanne Moron) 1 MG tablet Take 1 mg by mouth 4 (four) times daily.    [provider]  dicyclomine (BENTYL) 20 MG tablet Take 1 tablet (20 mg total) by mouth 2 (two) times daily as needed for spasms (abdominal pain). Patient not taking: Reported on 06/20/2022 06/14/22   Gareth Morgan, MD  Eszopiclone 3 MG TABS Take 3 mg by mouth at bedtime. Take immediately before bedtime Patient not taking: Reported on 06/20/2022    [provider]  ipratropium-albuterol (DUONEB) 0.5-2.5 (3) MG/3ML SOLN Take 3 mLs by nebulization every 6 (six) hours as needed. Patient not taking: Reported on 06/20/2022 01/07/21   Copland, Gay Filler, MD  ondansetron (ZOFRAN-ODT) 4 MG disintegrating tablet Take 1 tablet (4 mg total) by mouth every 8 (eight) hours as needed for nausea or vomiting (take this OR phenergan (do not take both.)). Patient not taking: Reported on 06/20/2022 06/14/22   Gareth Morgan, MD  sucralfate (CARAFATE) 1 g tablet Take 1 tablet (1 g total) by mouth 4 (four) times daily -  with meals and at bedtime. Patient not taking: Reported on 06/20/2022 03/22/22   Marrian Salvage, FNP      Allergies    Thorazine [chlorpromazine hcl], Tramadol, Phenytoin, Xarelto [rivaroxaban], Ciprofloxacin, and Vancomycin    Review of Systems   Review of Systems  Neurological:  Positive for weakness.    Physical Exam Updated Vital Signs BP 110/86   Pulse 64   Temp 98 F (36.7 C) (Oral)   Resp 14   Ht '5\' 4"'$  (1.626 m)   LMP 06/21/2022 (Approximate)   SpO2 100%   BMI 22.83 kg/m  Physical Exam Vitals and nursing note reviewed.  Constitutional:      General: She is awake.     Appearance: Normal appearance.     Comments: Peers very fatigued  HENT:     Head: Normocephalic and atraumatic.  Eyes:      Conjunctiva/sclera: Conjunctivae normal.  Cardiovascular:     Rate and Rhythm: Normal rate and regular rhythm.  Pulmonary:     Effort: Pulmonary effort is normal. No respiratory distress.     Breath sounds: Normal breath sounds.  Abdominal:     General: There is no distension.     Palpations: Abdomen is soft.     Tenderness: There is no abdominal tenderness.  Skin:    General: Skin is warm and dry.  Neurological:     General: No focal deficit present.  Psychiatric:        Attention and Perception: She perceives auditory and visual hallucinations.        Mood and Affect: Affect normal. Mood is anxious.        Speech: Speech normal.        Behavior: Behavior normal.        Thought Content: Thought content does not include homicidal or suicidal ideation.     Comments: Intermittent visual and auditory hallucinations.  Most recently was having a conversation with her deceased grandmother.     ED Results / Procedures / Treatments   Labs (all labs ordered are listed, but only abnormal results are displayed) Labs Reviewed  CBC WITH DIFFERENTIAL/PLATELET - Abnormal; Notable for the following components:      Result Value   Hemoglobin 16.2 (*)    MCHC 36.2 (*)    Neutro Abs 7.9 (*)    All other components within normal limits  COMPREHENSIVE METABOLIC PANEL - Abnormal; Notable for the following components:   Sodium 134 (*)    CO2 16 (*)    Calcium 8.7 (*)    All other components within normal limits  URINALYSIS, ROUTINE W REFLEX MICROSCOPIC - Abnormal; Notable for the following components:   Hgb urine dipstick MODERATE (*)    Ketones, ur 80 (*)    All other components within normal limits  URINALYSIS, MICROSCOPIC (REFLEX) - Abnormal; Notable for the following components:   Bacteria, UA RARE (*)    All other components within normal limits  C DIFFICILE QUICK SCREEN W PCR REFLEX    LIPASE, BLOOD  PREGNANCY, URINE  MAGNESIUM  TROPONIN I (HIGH SENSITIVITY)    EKG EKG  Interpretation  Date/Time:  Monday July 11 2022 18:33:23 EDT Ventricular Rate:  105 PR Interval:  159 QRS Duration: 97 QT Interval:  336 QTC Calculation: 444 R Axis:   100 Text Interpretation: Sinus tachycardia Right atrial enlargement Right axis deviation No significant change since last tracing Confirmed by Wandra Arthurs [phone]) on 07/11/2022 7:13:30 PM  Radiology CT Head Wo Contrast  Result Date: 07/11/2022 CLINICAL DATA:  Confusion EXAM: CT HEAD WITHOUT CONTRAST TECHNIQUE: Contiguous axial images were obtained from the base of the skull through the vertex without intravenous contrast. RADIATION DOSE REDUCTION: This exam was performed according to the departmental dose-optimization program which includes automated exposure control, adjustment  of the mA and/or kV according to patient size and/or use of iterative reconstruction technique. COMPARISON:  CT head October 28, 2018. FINDINGS: Brain: No evidence of acute infarction, hemorrhage, hydrocephalus, extra-axial collection or mass lesion/mass effect. Vascular: No hyperdense vessel. Skull: No acute fracture. Sinuses/Orbits: Clear sinuses.  No acute orbital findings. Other: No mastoid effusions. IMPRESSION: Normal head CT.  No acute abnormality. Electronically Signed   By: Margaretha Sheffield M.D.   On: 07/11/2022 19:44    Procedures Procedures    Medications Ordered in ED Medications  promethazine (PHENERGAN) 12.5 mg in sodium chloride 0.9 % 50 mL IVPB (0 mg Intravenous Stopped 07/11/22 2204)  promethazine (PHENERGAN) 25 MG/ML injection (  Not Given 07/11/22 2149)  lactated ringers bolus 1,000 mL (0 mLs Intravenous Stopped 07/11/22 2112)  ondansetron (ZOFRAN-ODT) disintegrating tablet 4 mg (4 mg Oral Given 07/11/22 2016)  pantoprazole (PROTONIX) injection 40 mg (40 mg Intravenous Given 07/11/22 2141)  lactated ringers bolus 1,000 mL (0 mLs Intravenous Stopped 07/11/22 2302)    ED Course/ Medical Decision Making/ A&P                              Medical Decision Making Amount and/or Complexity of Data Reviewed Labs: ordered. Radiology: ordered.   This patient is a 47 y.o. female  who presents to the ED for concern of generalized weakness, confusion, hallucinations. Diagnosed with C. Diff on 2/20.    Differential diagnoses prior to evaluation: The emergent differential diagnosis includes, but is not limited to,  Drug-related, hypoxia, hyper/hypoglycemia, encephalopathy, sepsis, DKA/HHS, brain lesion, CVA, seizure, environmental, psychiatric. This is not an exhaustive differential.   Past Medical History / Co-morbidities: breast cancer s/p bilateral mastectomy, anxiety, bipolar affective disorder, GERD, depression, degenerative joint disease, neuropathy, dilated cardiomyopathy secondary to chemotherapy, DVT in 2014, avascular necrosis of hip, chronic pain  Additional history: Chart reviewed. Pertinent results include: ER visit from 2/13 reviewed, and had CT imaging which was unremarkable.  She was given prescriptions for Phenergan, pantoprazole, and Bentyl.  She followed with GI on 2/19 and they scheduled her for an EGD and colonoscopy.  They obtain stool testing, resulted positive for C. difficile on 2/20.  Added Dulcolax to bowel prep regimen.  Physical Exam: Physical exam performed. The pertinent findings include: Patient appears very weak.  Abdomen with generalized tenderness, without rebound or guarding.  Voices some intermittent visual and auditory hallucinations.  No SI or HI.  Lab Tests/Imaging studies: I personally interpreted labs/imaging and the pertinent results include: No leukocytosis, hemoglobin somewhat elevated at 16.2, but fairly consistent compared to prior.  CMP grossly unremarkable.  Decreased CO2, but no anion gap.  Normal kidney function.  Normal magnesium and lipase.  Normal troponin.  Negative pregnancy.  Urinalysis with moderate hemoglobin, negative for infection.  C. difficile stool studies collected and  pending at time of discharge..   Patient has generalized abdominal pain with no focal findings on exam, and overall reassuring laboratory evaluation, do not think she is requiring repeat CT imaging of her abdomen today.  CT head obtained in the setting of confusion/altered mental status, which shows no acute intracranial findings.  Cardiac monitoring: EKG obtained and interpreted by my attending physician which shows: Sinus tachycardia   Medications: I ordered medication including IV fluids, Protonix, Zofran, Phenergan.  I have reviewed the patients home medicines and have made adjustments as needed.   Disposition: After consideration of the diagnostic results and the patients response  to treatment, I feel that emergency department workup does not suggest an emergent condition requiring admission or immediate intervention beyond what has been performed at this time. The plan is: Charged home with prescriptions for same the patient received in the ER, husband is requesting medication to assist with sleeping, and offered hydroxyzine.  They plan to follow-up for stool study testing with GI.  While she clinically appear dehydrated, she is feeling stronger after 2 L of IV fluids.  Her and her husband are both comfortable with the plan for discharge.. The patient is safe for discharge and has been instructed to return immediately for worsening symptoms, change in symptoms or any other concerns.  Final Clinical Impression(s) / ED Diagnoses Final diagnoses:  Dehydration  Diarrhea of presumed infectious origin  Nausea  Generalized weakness    Rx / DC Orders ED Discharge Orders          Ordered    promethazine (PHENERGAN) 25 MG tablet  Every 6 hours PRN        07/11/22 2344    ondansetron (ZOFRAN) 4 MG tablet  Every 6 hours        07/11/22 2344    pantoprazole (PROTONIX) 20 MG tablet  Daily        07/11/22 2344    hydrOXYzine (ATARAX) 25 MG tablet  Every 6 hours        07/11/22 2344            Portions of this report may have been transcribed using voice recognition software. Every effort was made to ensure accuracy; however, inadvertent computerized transcription errors may be present.    Estill Cotta 07/11/22 2356    Drenda Freeze, MD 07/12/22 2239

## 2022-07-11 NOTE — Telephone Encounter (Signed)
Inbound call from patient spouse requesting top speak with a nurse in regards to symptoms patient is experiencing.  Please advise.

## 2022-07-12 ENCOUNTER — Encounter: Payer: Medicare Other | Admitting: Gastroenterology

## 2022-07-12 LAB — C DIFFICILE QUICK SCREEN W PCR REFLEX
C Diff antigen: NEGATIVE
C Diff interpretation: NOT DETECTED
C Diff toxin: NEGATIVE

## 2022-07-12 NOTE — ED Notes (Signed)
Reviewed AVS with patient, patient expressed understanding of directions, denies further questions at this time. 

## 2022-08-11 ENCOUNTER — Encounter: Payer: Medicare Other | Admitting: Gastroenterology

## 2022-08-25 ENCOUNTER — Encounter: Payer: Self-pay | Admitting: Gastroenterology

## 2022-09-06 ENCOUNTER — Encounter: Payer: Self-pay | Admitting: Gastroenterology

## 2022-09-20 ENCOUNTER — Ambulatory Visit (AMBULATORY_SURGERY_CENTER): Payer: Medicare Other

## 2022-09-20 VITALS — Ht 64.0 in | Wt 133.0 lb

## 2022-09-20 DIAGNOSIS — K921 Melena: Secondary | ICD-10-CM

## 2022-09-20 DIAGNOSIS — R1013 Epigastric pain: Secondary | ICD-10-CM

## 2022-09-20 DIAGNOSIS — K5900 Constipation, unspecified: Secondary | ICD-10-CM

## 2022-09-20 DIAGNOSIS — R197 Diarrhea, unspecified: Secondary | ICD-10-CM

## 2022-09-20 NOTE — Progress Notes (Signed)
Pre visit completed via phone call; Patient verified name, DOB, and address;  No egg or soy allergy known to patient;  No issues known to pt with past sedation with any surgeries or procedures; Patient denies ever being told they had issues or difficulty with intubation;  No FH of Malignant Hyperthermia Pt is not on diet pills; Pt is not on home 02;  Pt is not on blood thinners;  Pt reports issues with constipation-patient reports she does not have issues with constipation unless she quits smoking; patient advised to increase oral fluids, activity, and fruits/veggies, and she can also take stool softener and/or a gentle laxative as well as Miralax to prevent constipation;  No A fib or A flutter Have any cardiac testing pending--NO Pt instructed to use [employer] or GoodRx for a price reduction on prep   Insurance verified during PV appt=[employer] Medicare  Patient's chart reviewed by Cathlyn Parsons CNRA prior to previsit and patient appropriate for the [facility].  Previsit completed and red dot placed by patient's name on their procedure day (on provider's schedule).    Patient reports she already has the Clenpiq prep at her house;   Patient reports her last seizure (witnessed) was 1 month ago and she reports she did not go to the ER or follow up with her prescribing MD; patient reports she was severely dehydrated and under an extreme level of stress as to why she had the seizure however, has not had a seizure since 2016; patient reports seizures are brought on by extreme stress;

## 2022-10-06 ENCOUNTER — Encounter: Payer: Self-pay | Admitting: Gastroenterology

## 2022-10-10 DIAGNOSIS — H903 Sensorineural hearing loss, bilateral: Secondary | ICD-10-CM | POA: Diagnosis not present

## 2022-10-10 DIAGNOSIS — H9203 Otalgia, bilateral: Secondary | ICD-10-CM | POA: Diagnosis not present

## 2022-10-10 DIAGNOSIS — M26622 Arthralgia of left temporomandibular joint: Secondary | ICD-10-CM | POA: Diagnosis not present

## 2022-10-13 DIAGNOSIS — H9203 Otalgia, bilateral: Secondary | ICD-10-CM | POA: Diagnosis not present

## 2022-10-13 DIAGNOSIS — H90A21 Sensorineural hearing loss, unilateral, right ear, with restricted hearing on the contralateral side: Secondary | ICD-10-CM | POA: Diagnosis not present

## 2022-10-17 DIAGNOSIS — H903 Sensorineural hearing loss, bilateral: Secondary | ICD-10-CM | POA: Diagnosis not present

## 2022-10-21 ENCOUNTER — Encounter: Payer: Medicare Other | Admitting: Gastroenterology

## 2022-11-20 ENCOUNTER — Emergency Department (HOSPITAL_BASED_OUTPATIENT_CLINIC_OR_DEPARTMENT_OTHER): Payer: Medicare Other

## 2022-11-20 ENCOUNTER — Other Ambulatory Visit: Payer: Self-pay

## 2022-11-20 ENCOUNTER — Emergency Department (HOSPITAL_BASED_OUTPATIENT_CLINIC_OR_DEPARTMENT_OTHER)
Admission: EM | Admit: 2022-11-20 | Discharge: 2022-11-20 | Disposition: A | Discharge status: Home / Self Care | Payer: Medicare Other | Attending: Emergency Medicine | Admitting: Emergency Medicine

## 2022-11-20 ENCOUNTER — Encounter (HOSPITAL_BASED_OUTPATIENT_CLINIC_OR_DEPARTMENT_OTHER): Payer: Self-pay | Admitting: Urology

## 2022-11-20 DIAGNOSIS — R140 Abdominal distension (gaseous): Secondary | ICD-10-CM | POA: Diagnosis not present

## 2022-11-20 DIAGNOSIS — R112 Nausea with vomiting, unspecified: Secondary | ICD-10-CM | POA: Diagnosis not present

## 2022-11-20 DIAGNOSIS — R109 Unspecified abdominal pain: Secondary | ICD-10-CM | POA: Diagnosis not present

## 2022-11-20 DIAGNOSIS — Z853 Personal history of malignant neoplasm of breast: Secondary | ICD-10-CM | POA: Diagnosis not present

## 2022-11-20 DIAGNOSIS — R718 Other abnormality of red blood cells: Secondary | ICD-10-CM | POA: Insufficient documentation

## 2022-11-20 DIAGNOSIS — K219 Gastro-esophageal reflux disease without esophagitis: Secondary | ICD-10-CM | POA: Diagnosis not present

## 2022-11-20 DIAGNOSIS — R1013 Epigastric pain: Secondary | ICD-10-CM | POA: Diagnosis not present

## 2022-11-20 DIAGNOSIS — R9431 Abnormal electrocardiogram [ECG] [EKG]: Secondary | ICD-10-CM | POA: Diagnosis not present

## 2022-11-20 LAB — URINALYSIS, ROUTINE W REFLEX MICROSCOPIC
Bilirubin Urine: NEGATIVE
Glucose, UA: NEGATIVE mg/dL
Ketones, ur: 15 mg/dL — AB
Leukocytes,Ua: NEGATIVE
Nitrite: NEGATIVE
Protein, ur: NEGATIVE mg/dL
Specific Gravity, Urine: 1.01 (ref 1.005–1.030)
pH: 6 (ref 5.0–8.0)

## 2022-11-20 LAB — URINALYSIS, MICROSCOPIC (REFLEX)
RBC / HPF: NONE SEEN RBC/hpf (ref 0–5)
WBC, UA: NONE SEEN WBC/hpf (ref 0–5)

## 2022-11-20 LAB — CBC WITH DIFFERENTIAL/PLATELET
Abs Immature Granulocytes: 0.02 10*3/uL (ref 0.00–0.07)
Basophils Absolute: 0 10*3/uL (ref 0.0–0.1)
Basophils Relative: 0 %
Eosinophils Absolute: 0 10*3/uL (ref 0.0–0.5)
Eosinophils Relative: 0 %
HCT: 45.5 % (ref 36.0–46.0)
Hemoglobin: 16.2 g/dL — ABNORMAL HIGH (ref 12.0–15.0)
Immature Granulocytes: 0 %
Lymphocytes Relative: 16 %
Lymphs Abs: 1.6 10*3/uL (ref 0.7–4.0)
MCH: 33.1 pg (ref 26.0–34.0)
MCHC: 35.6 g/dL (ref 30.0–36.0)
MCV: 93 fL (ref 80.0–100.0)
Monocytes Absolute: 0.5 10*3/uL (ref 0.1–1.0)
Monocytes Relative: 5 %
Neutro Abs: 7.8 10*3/uL — ABNORMAL HIGH (ref 1.7–7.7)
Neutrophils Relative %: 79 %
Platelets: 242 10*3/uL (ref 150–400)
RBC: 4.89 MIL/uL (ref 3.87–5.11)
RDW: 11.9 % (ref 11.5–15.5)
WBC: 9.9 10*3/uL (ref 4.0–10.5)
nRBC: 0 % (ref 0.0–0.2)

## 2022-11-20 LAB — COMPREHENSIVE METABOLIC PANEL
ALT: 15 U/L (ref 0–44)
AST: 21 U/L (ref 15–41)
Albumin: 4.4 g/dL (ref 3.5–5.0)
Alkaline Phosphatase: 56 U/L (ref 38–126)
Anion gap: 12 (ref 5–15)
BUN: 8 mg/dL (ref 6–20)
CO2: 21 mmol/L — ABNORMAL LOW (ref 22–32)
Calcium: 9 mg/dL (ref 8.9–10.3)
Chloride: 103 mmol/L (ref 98–111)
Creatinine, Ser: 0.82 mg/dL (ref 0.44–1.00)
GFR, Estimated: >60 mL/min (ref >60)
Glucose, Bld: 95 mg/dL (ref 70–99)
Potassium: 3.7 mmol/L (ref 3.5–5.1)
Sodium: 136 mmol/L (ref 135–145)
Total Bilirubin: 0.8 mg/dL (ref 0.3–1.2)
Total Protein: 7.2 g/dL (ref 6.5–8.1)

## 2022-11-20 LAB — ETHANOL: Alcohol, Ethyl (B): <10 mg/dL (ref <10)

## 2022-11-20 LAB — LIPASE, BLOOD: Lipase: 27 U/L (ref 11–51)

## 2022-11-20 LAB — HCG, QUANTITATIVE, PREGNANCY: hCG, Beta Chain, Quant, S: <1 m[IU]/mL (ref <5)

## 2022-11-20 MED ORDER — FENTANYL CITRATE PF 50 MCG/ML IJ SOSY
50.0000 ug | PREFILLED_SYRINGE | Freq: Once | INTRAMUSCULAR | Status: DC
  Filled 2022-11-20: qty 1

## 2022-11-20 MED ORDER — SUCRALFATE 1 G PO TABS: 1.0000 g | ORAL_TABLET | Freq: Three times a day (TID) | ORAL | 0 refills | Status: DC

## 2022-11-20 MED ORDER — DICYCLOMINE HCL 20 MG PO TABS: 20.0000 mg | ORAL_TABLET | Freq: Two times a day (BID) | ORAL | 0 refills | Status: DC

## 2022-11-20 MED ORDER — METOCLOPRAMIDE HCL 10 MG PO TABS
10.0000 mg | ORAL_TABLET | Freq: Once | ORAL | Status: AC
  Administered 2022-11-20: 10 mg via ORAL
  Filled 2022-11-20: qty 1

## 2022-11-20 MED ORDER — PANTOPRAZOLE SODIUM 20 MG PO TBEC: 20.0000 mg | DELAYED_RELEASE_TABLET | Freq: Every day | ORAL | 0 refills | Status: DC

## 2022-11-20 MED ORDER — IOHEXOL 300 MG/ML  SOLN
75.0000 mL | Freq: Once | INTRAMUSCULAR | Status: AC | PRN
  Administered 2022-11-20: 75 mL via INTRAVENOUS

## 2022-11-20 MED ORDER — ONDANSETRON HCL 4 MG/2ML IJ SOLN
4.0000 mg | Freq: Once | INTRAMUSCULAR | Status: AC
  Administered 2022-11-20: 4 mg via INTRAVENOUS
  Filled 2022-11-20: qty 2

## 2022-11-20 MED ORDER — ONDANSETRON HCL 4 MG PO TABS: 4.0000 mg | ORAL_TABLET | Freq: Three times a day (TID) | ORAL | 0 refills | Status: DC | PRN

## 2022-11-20 MED ORDER — HYDROXYZINE HCL 25 MG PO TABS: 25.0000 mg | ORAL_TABLET | Freq: Four times a day (QID) | ORAL | 0 refills | Status: DC

## 2022-11-20 MED ORDER — SODIUM CHLORIDE 0.9 % IV BOLUS
1000.0000 mL | Freq: Once | INTRAVENOUS | Status: AC
  Administered 2022-11-20: 1000 mL via INTRAVENOUS

## 2022-11-20 MED ORDER — PROMETHAZINE HCL 25 MG PO TABS: 25.0000 mg | ORAL_TABLET | Freq: Four times a day (QID) | ORAL | 0 refills | Status: DC | PRN

## 2022-11-20 NOTE — Discharge Instructions (Addendum)
Thank you for allowing [place] to be part of your care today.  You were evaluated in the ED for abdominal pain, bloating, nausea, and vomiting.  Your workup today is overall reassuring.  Your CT imaging did not show any acute abnormalities.  I recommend following up with your GI doctor regarding your stomach issues.  I have sent over prescriptions for pantoprazole to help with acid reduction as well as sucralfate to treat any potential ulcers.  I have also sent over Zofran and Phenergan to use for nausea and vomiting.  A prescription for dicyclomine will help with any abdominal cramping, please note, this medication can cause constipation.   I have attached information about food recommendations for reflux.  Return to the ED if you develop sudden worsening of your symptoms, are unable to tolerate liquids despite medications, or if you have any new concerns.

## 2022-11-20 NOTE — ED Provider Notes (Signed)
[place] EMERGENCY DEPARTMENT AT [facility] Provider Note   CSN: [id number] Arrival date & time: 11/20/22  1550     History {Add pertinent medical, surgical, social history, OB history to HPI:1} Chief Complaint  Patient presents with   Emesis    Crystal [name] is a 47 y.o. female with past medical history significant for GERD, anorexia nervosa, chronic pain, alcohol abuse, breast cancer, MDD, bipolar disorder, tobacco abuse, seizure disorder presents to the ED complaining of reflux, abdominal pain and bloating, nausea, vomiting.  She also endorses decreased appetite and fatigue for the past 4 days.  Patient was constipated, but reports normal bowel movement today.  She reports that anytime she eats or drinks, she has bloating in her abdomen and she has not been able to tolerate many solids.  Patient was able to eat a banana today.  She has been tolerating fluids, but reports it makes her stomach pain and bloating worse.  She is being followed by GI and was scheduled to have endoscopy and colonoscopy, but was unable to tolerate the prep.  She is no longer taking pantoprazole for GERD symptoms, but has been trying Nexium.       Home Medications Prior to Admission medications   Medication Sig Start Date End Date Taking? Authorizing Provider  albuterol (VENTOLIN HFA) 108 (90 Base) MCG/ACT inhaler INHALE 2 PUFFS INTO THE LUNGS EVERY 4 HOURS AS NEEDED FOR WHEEZING OR SHORTNESS OF BREATH. Patient taking differently: Inhale 2 puffs into the lungs as needed for wheezing or shortness of breath. 10/28/21   Cobb, Ruby Cola, NP  ALPRAZolam Prudy Feeler) 1 MG tablet Take 1 mg by mouth 4 (four) times daily.    [provider]  esomeprazole (NEXIUM) 20 MG capsule Take 20 mg by mouth daily at 12 noon. 11/21/19   [provider]  Eszopiclone 3 MG TABS Take 3 mg by mouth at bedtime. Take immediately before bedtime Patient not taking: Reported on 09/20/2022    [provider]  gabapentin (NEURONTIN) 600 MG tablet Take 600 mg by mouth 2 (two) times daily. 3 tabs in AM, 2 tabs in PM    [provider]  loratadine (CLARITIN) 10 MG tablet Take 10 mg by mouth daily.    [provider]  pantoprazole (PROTONIX) 20 MG tablet Take 1 tablet (20 mg total) by mouth daily. Patient not taking: Reported on 09/20/2022 07/11/22   Roemhildt, Lorin T, PA-C  saccharomyces boulardii (FLORASTOR) 250 MG capsule Take 500 mg by mouth 2 (two) times daily.    [provider]      Allergies    Thorazine [chlorpromazine hcl], Tramadol, Phenytoin, and Xarelto [rivaroxaban]    Review of Systems   Review of Systems  Constitutional:  Positive for appetite change (decreased) and fatigue. Negative for fever.  Gastrointestinal:  Positive for abdominal distention, abdominal pain, constipation, nausea and vomiting. Negative for diarrhea.  Neurological:  Negative for syncope and weakness.    Physical Exam Updated Vital Signs BP 129/88 (BP Location: Right Arm)   Pulse 78   Temp 98.6 F (37 C) (Oral)   Resp 18   Ht 5\' 4"  (1.626 m)   Wt 57 kg   SpO2 96%   BMI 21.56 kg/m  Physical Exam  ED Results / Procedures / Treatments   Labs (all labs ordered are listed, but only abnormal results are displayed) Labs Reviewed  COMPREHENSIVE METABOLIC PANEL - Abnormal; Notable for the following components:  Result Value   CO2 21 (*)    All other components within normal limits  CBC WITH DIFFERENTIAL/PLATELET - Abnormal; Notable for the following components:   Hemoglobin 16.2 (*)    Neutro Abs 7.8 (*)    All other components within normal limits  URINALYSIS, ROUTINE W REFLEX MICROSCOPIC - Abnormal; Notable for the following components:   Hgb urine dipstick SMALL (*)    Ketones, ur 15 (*)    All other components within normal limits  URINALYSIS, MICROSCOPIC (REFLEX) - Abnormal; Notable for the following components:   Bacteria, UA RARE (*)    All other components  within normal limits  LIPASE, BLOOD  HCG, QUANTITATIVE, PREGNANCY  ETHANOL    EKG None  Radiology CT ABDOMEN PELVIS W CONTRAST  Result Date: 11/20/2022 CLINICAL DATA:  Abdominal pain, nausea, vomiting, fatigue EXAM: CT ABDOMEN AND PELVIS WITH CONTRAST TECHNIQUE: Multidetector CT imaging of the abdomen and pelvis was performed using the standard protocol following bolus administration of intravenous contrast. RADIATION DOSE REDUCTION: This exam was performed according to the departmental dose-optimization program which includes automated exposure control, adjustment of the mA and/or kV according to patient size and/or use of iterative reconstruction technique. CONTRAST:  75mL OMNIPAQUE IOHEXOL 300 MG/ML  SOLN COMPARISON:  None Available. FINDINGS: Lower chest: No acute abnormality Hepatobiliary: No focal hepatic abnormality. Gallbladder unremarkable. Pancreas: No focal abnormality or ductal dilatation. Spleen: No focal abnormality.  Normal size. Adrenals/Urinary Tract: No adrenal abnormality. No focal renal abnormality. No stones or hydronephrosis. Urinary bladder is unremarkable. Stomach/Bowel: Normal appendix. Stomach, large and small bowel grossly unremarkable. Vascular/Lymphatic: No evidence of aneurysm or adenopathy. Reproductive: Uterus and adnexa unremarkable.  No mass. Other: No free fluid or free air. Musculoskeletal: Bilateral hip replacements. No acute bony abnormality. IMPRESSION: No acute findings in the abdomen or pelvis. Electronically Signed   By: Charlett Nose M.D.   On: 11/20/2022 19:06    Procedures Procedures  {Document cardiac monitor, telemetry assessment procedure when appropriate:1}  Medications Ordered in ED Medications  sodium chloride 0.9 % bolus 1,000 mL ( Intravenous Stopped 11/20/22 1746)  ondansetron (ZOFRAN) injection 4 mg (4 mg Intravenous Given 11/20/22 1746)  iohexol (OMNIPAQUE) 300 MG/ML solution 75 mL (75 mLs Intravenous Contrast Given 11/20/22 1844)   metoCLOPramide (REGLAN) tablet 10 mg (10 mg Oral Given 11/20/22 1958)    ED Course/ Medical Decision Making/ A&P   {   Click here for ABCD2, HEART and other calculatorsREFRESH Note before signing :1}                          Medical Decision Making Amount and/or Complexity of Data Reviewed Labs: ordered. Radiology: ordered.  Risk Prescription drug management.   This patient presents to the ED with chief complaint(s) of *** with pertinent past medical history of ***.  The complaint involves an extensive differential diagnosis and also carries with it a high risk of complications and morbidity.    The differential diagnosis includes ***   The initial plan is to ***  Additional history obtained: Additional history obtained from {additional history:26846} Records reviewed {records:26847}  Initial Assessment:   ***  Independent ECG/labs interpretation:  The following labs were independently interpreted:  ***  Independent visualization and interpretation of imaging: I independently visualized the following imaging with scope of interpretation limited to determining acute life threatening conditions related to emergency care: ***, which revealed ***  Treatment and Reassessment: ***  Consultations obtained:   ***  Disposition:   ***  Social Determinants of Health:   Patient's {SWFU:93235}  increases the complexity of managing their presentation   {Document critical care time when appropriate:1} {Document review of labs and clinical decision tools ie heart score, Chads2Vasc2 etc:1}  {Document your independent review of radiology images, and any outside records:1} {Document your discussion with family members, caretakers, and with consultants:1} {Document social determinants of health affecting pt's care:1} {Document your decision making why or why not admission, treatments were needed:1} Final Clinical Impression(s) / ED Diagnoses Final diagnoses:  None    Rx / DC  Orders ED Discharge Orders     None

## 2022-11-20 NOTE — ED Triage Notes (Signed)
Pt states nausea, vomiting,  decreased appetite, fatigue x 4 days was constipated but had BM today  States constant bloating, not tolerating po intake  Daily ETOH use, last yesterday

## 2022-11-20 NOTE — ED Notes (Signed)
Pt sipping on water and not vomiting

## 2023-01-31 ENCOUNTER — Ambulatory Visit: Payer: Medicare Other | Admitting: Nurse Practitioner

## 2023-02-13 ENCOUNTER — Encounter: Payer: Self-pay | Admitting: Gastroenterology

## 2023-10-20 NOTE — Progress Notes (Deleted)
 [facility] [address] [place], [place] [address] [phone]  Date:  10/25/2023   Name:  Crystal [name]   DOB:  [birth year]   MRN:  [id number]  PCP:  Kaylee Partridge, MD    Chief Complaint: No chief complaint on file.   History of Present Illness:  Crystal [name] is a 48 y.o. very pleasant female patient who presents with the following:  Patient seen today with concern of back pain.  Most recent visit with myself was in August 2023-I have seen her more recently when she comes in with her husband Gorman Laughter  History of breast cancer, status post bilateral mastectomy-avascular necrosis of the hips with hip replacement, significant depression and suicidality, seizure disorder  Colon cancer screening  Patient Active Problem List   Diagnosis Date Noted   Chronic bronchitis (HCC) 04/29/2021   Pseudomonas infection 04/29/2021   Chronic rhinitis 04/29/2021   Abnormal EKG 04/29/2021   Tobacco abuse 04/29/2021   MDD (major depressive disorder) 08/21/2019   Severe recurrent major depression without psychotic features (HCC) 08/21/2019   Suicide attempt (HCC) 08/21/2019   Osteonecrosis of hip (HCC) 06/13/2018   Tibial plateau fracture, right, closed, initial encounter 11/27/2017   Anorexia nervosa 08/19/2014   Back pain 08/19/2014   Malnutrition, calorie (HCC) 12/03/2013   Breast cancer of upper-outer quadrant of right female breast (HCC) 03/21/2013   Chronic pain 01/09/2013   DVT of upper extremity (deep vein thrombosis) (HCC) 01/02/2013   Arm edema 12/27/2012   Neuropathic pain 12/27/2012   Chronic female pelvic pain 08/21/2012   Fatigue 08/20/2012   Chemotherapy induced cardiomyopathy (HCC) 09/08/2011   Generalized convulsive seizure (HCC) 07/13/2011   Seizure disorder (HCC) 07/13/2011   Migraine 07/13/2011   Anxiety disorder 07/13/2011   Depression 07/13/2011   Hx MRSA infection 04/13/2011    Past Medical  History:  Diagnosis Date   Alcohol abuse    Anorexia nervosa    Anxiety    Avascular necrosis of bone of hip (HCC)    Bipolar affective disorder, mixed (HCC)    Breast cancer (HCC)    Right   C. difficile diarrhea    hx of   Chronic pain    Depression    Dilated cardiomyopathy secondary to drug (HCC)    DJD (degenerative joint disease)    BONE STICKS OUT ON LEFT PART OF LOWER BACK   DVT of upper extremity (deep vein thrombosis) (HCC) 2014   Edema of upper extremity    Family history of anesthesia complication    MOTHER PONV   GERD (gastroesophageal reflux disease)    OCCASIONALLY TAKE ZANTAC   History of breast cancer DX JUNE 2012 W/ RIGHT BREAST INVASIVE DUCTAL CA  STAGE IIB---  S/P BILATERAL MASTECTOMIES AND RIGHT NODE DISSECTION   ONCOLOGIST-- DR Charolett Copes--  CHEMO ENDED DEC 2012 / RADIATION ENDED MAR 2013--  CURRENT ON TAMOXIFEN  AND ZOLADEX    History of idiopathic seizure    DX 2008 --  LAST ONE 2012--  NO ISSUES SINCE AND NO MEDS. -- PT STATES DOCTORS FELT IT WAS STRESS RELATED DUE TO HEALTH ISSUES   Hot flashes due to tamoxifen     Hx MRSA infection 03/28/2011   Skin   Left ovarian cyst    Migraines    Neuropathy    Pneumonia    Seizures (HCC)    2024 last seizure- patient reports it was all related to my extreme stress  level; takes Xanax  for this condition   Status post chemotherapy    docetaxel /carboplatin /trastuzumab     Past Surgical History:  Procedure Laterality Date   BIOPSY BREAST  10/14/2010   right needle core biopsy   BREAST SURGERY  11/16/2010   bilateral mastectomy+ right axillary node dissection,T1cN1a, Her2+,ERPR+  (right breast invasive ductal carcinoma)   CYSTO WITH HYDRODISTENSION N/A 07/23/2012   Procedure: CYSTOSCOPY/HYDRODISTENSION instillation of marcaine  and pyridium ;  Surgeon: Devorah Fonder, MD;  Location: [facility];  Service: Urology;  Laterality: N/A;  INSTILLATION     HARDWARE REMOVAL Right 12/03/2018    Procedure: Right tibia screw removal;  Surgeon: Liliane Rei, MD;  Location: [facility];  Service: Orthopedics;  Laterality: Right;    I & D EXTREMITY  09/26/2011   Procedure: IRRIGATION AND DEBRIDEMENT EXTREMITY;  Surgeon: Milagros Alf, MD;  Location: [facility];  Service: Orthopedics;  Laterality: Right;  I&D right hand cat bite wound   ORIF TIBIA PLATEAU Right 11/27/2017   Procedure: OPEN REDUCTION INTERNAL FIXATION (ORIF) TIBIAL PLATEAU FRACTURE;  Surgeon: Liliane Rei, MD;  Location: [facility];  Service: Orthopedics;  Laterality: Right;   PORT-A-CATH REMOVAL     PORTACATH PLACEMENT  11/16/2010   placement of left subclavian port NOW REMOVED   right knee torn meniscus surgery      TOTAL HIP ARTHROPLASTY Left 06/13/2018   Procedure: TOTAL HIP ARTHROPLASTY ANTERIOR APPROACH;  Surgeon: Liliane Rei, MD;  Location: [facility];  Service: Orthopedics;  Laterality: Left;    TOTAL HIP ARTHROPLASTY Right 02/02/2022   Procedure: TOTAL HIP ARTHROPLASTY ANTERIOR APPROACH;  Surgeon: Liliane Rei, MD;  Location: [facility];  Service: Orthopedics;  Laterality: Right;   TRANSTHORACIC ECHOCARDIOGRAM  05/28/2012   LVF NORMAL/  EF 55-60%   WISDOM TOOTH EXTRACTION      Social History   Tobacco Use   Smoking status: Every Day    Current packs/day: 1.50    Average packs/day: 1.5 packs/day for 20.0 years (30.0 ttl pk-yrs)    Types: Cigarettes   Smokeless tobacco: Never  Vaping Use   Vaping status: Never Used  Substance Use Topics   Alcohol use: Yes    Alcohol/week: 4.0 standard drinks of alcohol    Types: 4 Glasses of wine per week    Comment: daily   Drug use: No    Comment: LAST USED COCAINE AND MARIJIUANA 2011    Family History  Problem Relation Age of Onset   Colon polyps Mother 23   Mental illness Brother    Cancer Maternal Grandfather    Mental illness Son    Stomach cancer Neg Hx    Esophageal cancer Neg Hx    Colon cancer Neg Hx    Rectal cancer Neg Hx     Allergies  Allergen  Reactions   Thorazine [Chlorpromazine Hcl] Hives   Tramadol  Other (See Comments)    Seizures   Phenytoin  Nausea And Vomiting   Xarelto  [Rivaroxaban ]     Medication list has been reviewed and updated.  Current Outpatient Medications on File Prior to Visit  Medication Sig Dispense Refill   albuterol  (VENTOLIN  HFA) 108 (90 Base) MCG/ACT inhaler INHALE 2 PUFFS INTO THE LUNGS EVERY 4 HOURS AS NEEDED FOR WHEEZING OR SHORTNESS OF BREATH. (Patient taking differently: Inhale 2 puffs into the lungs as needed for wheezing or shortness of breath.) 18 each 2   ALPRAZolam  (XANAX ) 1 MG tablet Take 1 mg by mouth 4 (four) times daily.  dicyclomine  (BENTYL ) 20 MG tablet Take 1 tablet (20 mg total) by mouth 2 (two) times daily. 20 tablet 0   Eszopiclone 3 MG TABS Take 3 mg by mouth at bedtime. Take immediately before bedtime (Patient not taking: Reported on 09/20/2022)     gabapentin  (NEURONTIN ) 600 MG tablet Take 600 mg by mouth 2 (two) times daily. 3 tabs in AM, 2 tabs in PM     hydrOXYzine  (ATARAX ) 25 MG tablet Take 1 tablet (25 mg total) by mouth every 6 (six) hours. 12 tablet 0   loratadine  (CLARITIN ) 10 MG tablet Take 10 mg by mouth daily.     ondansetron  (ZOFRAN ) 4 MG tablet Take 1 tablet (4 mg total) by mouth every 8 (eight) hours as needed for nausea or vomiting. 12 tablet 0   pantoprazole  (PROTONIX ) 20 MG tablet Take 1 tablet (20 mg total) by mouth daily. 30 tablet 0   promethazine  (PHENERGAN ) 25 MG tablet Take 1 tablet (25 mg total) by mouth every 6 (six) hours as needed for nausea or vomiting. 30 tablet 0   saccharomyces boulardii (FLORASTOR) 250 MG capsule Take 500 mg by mouth 2 (two) times daily.     sucralfate  (CARAFATE ) 1 g tablet Take 1 tablet (1 g total) by mouth 4 (four) times daily -  with meals and at bedtime. 47 tablet 0   No current facility-administered medications on file prior to visit.    Review of Systems:  As per HPI- otherwise negative.   Physical Examination: There  were no vitals filed for this visit. There were no vitals filed for this visit. There is no height or weight on file to calculate BMI. Ideal Body Weight:    GEN: no acute distress. HEENT: Atraumatic, Normocephalic.  Ears and Nose: No external deformity. CV: RRR, No M/G/R. No JVD. No thrill. No extra heart sounds. PULM: CTA B, no wheezes, crackles, rhonchi. No retractions. No resp. distress. No accessory muscle use. ABD: S, NT, ND, +BS. No rebound. No HSM. EXTR: No c/c/e PSYCH: Normally interactive. Conversant.    Assessment and Plan: ***  Signed Gates Kasal, MD

## 2023-10-25 ENCOUNTER — Ambulatory Visit: Admitting: Family Medicine

## 2023-11-20 ENCOUNTER — Emergency Department (HOSPITAL_BASED_OUTPATIENT_CLINIC_OR_DEPARTMENT_OTHER)
Admission: EM | Admit: 2023-11-20 | Discharge: 2023-11-20 | Disposition: A | Discharge status: Home / Self Care | Attending: Emergency Medicine | Admitting: Emergency Medicine

## 2023-11-20 ENCOUNTER — Other Ambulatory Visit: Payer: Self-pay

## 2023-11-20 ENCOUNTER — Encounter (HOSPITAL_BASED_OUTPATIENT_CLINIC_OR_DEPARTMENT_OTHER): Payer: Self-pay | Admitting: Urology

## 2023-11-20 DIAGNOSIS — Z853 Personal history of malignant neoplasm of breast: Secondary | ICD-10-CM | POA: Diagnosis not present

## 2023-11-20 DIAGNOSIS — M545 Low back pain, unspecified: Secondary | ICD-10-CM | POA: Diagnosis not present

## 2023-11-20 DIAGNOSIS — M5459 Other low back pain: Secondary | ICD-10-CM | POA: Diagnosis not present

## 2023-11-20 MED ORDER — OXYCODONE HCL 5 MG PO TABS
5.0000 mg | ORAL_TABLET | Freq: Once | ORAL | Status: AC
  Administered 2023-11-20: 5 mg via ORAL
  Filled 2023-11-20: qty 1

## 2023-11-20 MED ORDER — DIAZEPAM 5 MG PO TABS
5.0000 mg | ORAL_TABLET | Freq: Once | ORAL | Status: AC
  Administered 2023-11-20: 5 mg via ORAL
  Filled 2023-11-20: qty 1

## 2023-11-20 MED ORDER — ACETAMINOPHEN 500 MG PO TABS
1000.0000 mg | ORAL_TABLET | Freq: Once | ORAL | Status: AC
  Administered 2023-11-20: 1000 mg via ORAL
  Filled 2023-11-20: qty 2

## 2023-11-20 MED ORDER — KETOROLAC TROMETHAMINE 15 MG/ML IJ SOLN
15.0000 mg | Freq: Once | INTRAMUSCULAR | Status: AC
  Administered 2023-11-20: 15 mg via INTRAMUSCULAR
  Filled 2023-11-20: qty 1

## 2023-11-20 MED ORDER — METHOCARBAMOL 500 MG PO TABS: 500.0000 mg | ORAL_TABLET | Freq: Two times a day (BID) | ORAL | 0 refills | Status: DC

## 2023-11-20 NOTE — Discharge Instructions (Signed)
 Your back pain is most likely due to a muscular strain.  There is been a lot of research on back pain, unfortunately the only thing that seems to really help is Tylenol and ibuprofen.  Relative rest is also important to not lift greater than 10 pounds bending or twisting at the waist.  Please follow-up with your family physician.  The other thing that really seems to benefit patients is physical therapy which your doctor may send you for.  Please return to the emergency department for new numbness or weakness to your arms or legs. Difficulty with urinating or urinating or pooping on yourself.  Also if you cannot feel toilet paper when you wipe or get a fever.   Take 4 over the counter ibuprofen tablets 3 times a day or 2 over-the-counter naproxen tablets twice a day for pain. Also take tylenol 1000mg (2 extra strength) four times a day.   Stretches can also help try: [email or web address]

## 2023-11-20 NOTE — ED Provider Notes (Signed)
 [place] [facility] AT [facility] Provider Note   CSN: [id number] Arrival date & time: 11/20/23  1121     Patient presents with: Back Pain   Crystal [name] is a 48 y.o. female.   48 yo F with a chief complaints of low back pain.  Bilateral going on for a few days now.  She said that she is had pain like this similarly in the past and feels like it is just tight and tends to get better over time.  She wanted to see if she came into the hospital if she maybe would get a jumpstart on that and maybe not take as long to get better.  She denies loss of bowel or bladder denies loss.  Sensation denies numbness or weakness of the legs.  Denies trauma.   Back Pain      Prior to Admission medications   Medication Sig Start Date End Date Taking? Authorizing Provider  methocarbamol  (ROBAXIN ) 500 MG tablet Take 1 tablet (500 mg total) by mouth 2 (two) times daily. 11/20/23  Yes Emil Share, DO  albuterol  (VENTOLIN  HFA) 108 (90 Base) MCG/ACT inhaler INHALE 2 PUFFS INTO THE LUNGS EVERY 4 HOURS AS NEEDED FOR WHEEZING OR SHORTNESS OF BREATH. Patient taking differently: Inhale 2 puffs into the lungs as needed for wheezing or shortness of breath. 10/28/21   Cobb, Comer GAILS, NP  ALPRAZolam  (XANAX ) 1 MG tablet Take 1 mg by mouth 4 (four) times daily.    [provider]  dicyclomine  (BENTYL ) 20 MG tablet Take 1 tablet (20 mg total) by mouth 2 (two) times daily. 11/20/22   Clark, Meghan R, PA-C  Eszopiclone 3 MG TABS Take 3 mg by mouth at bedtime. Take immediately before bedtime Patient not taking: Reported on 09/20/2022    [provider]  gabapentin  (NEURONTIN ) 600 MG tablet Take 600 mg by mouth 2 (two) times daily. 3 tabs in AM, 2 tabs in PM    [provider]  hydrOXYzine  (ATARAX ) 25 MG tablet Take 1 tablet (25 mg total) by mouth every 6 (six) hours. 11/20/22   Clark, Meghan R, PA-C  loratadine  (CLARITIN ) 10 MG tablet Take 10 mg by mouth daily.     [provider]  ondansetron  (ZOFRAN ) 4 MG tablet Take 1 tablet (4 mg total) by mouth every 8 (eight) hours as needed for nausea or vomiting. 11/20/22   Gretta, Meghan R, PA-C  pantoprazole  (PROTONIX ) 20 MG tablet Take 1 tablet (20 mg total) by mouth daily. 11/20/22   Clark, Meghan R, PA-C  promethazine  (PHENERGAN ) 25 MG tablet Take 1 tablet (25 mg total) by mouth every 6 (six) hours as needed for nausea or vomiting. 11/20/22   Gretta, Meghan R, PA-C  saccharomyces boulardii (FLORASTOR) 250 MG capsule Take 500 mg by mouth 2 (two) times daily.    [provider]  sucralfate  (CARAFATE ) 1 g tablet Take 1 tablet (1 g total) by mouth 4 (four) times daily -  with meals and at bedtime. 11/20/22   Gretta Gerard SAUNDERS, PA-C    Allergies: Thorazine [chlorpromazine hcl], Tramadol , Phenytoin , and Xarelto  [rivaroxaban ]    Review of Systems  Musculoskeletal:  Positive for back pain.    Updated Vital Signs BP (!) 153/102 (BP Location: Right Arm)   Pulse 92   Temp 98.1 F (36.7 C) (Oral)   Resp (!) 22   Ht 5' 4 (1.626 m)   Wt 57 kg   SpO2 100%   BMI 21.57 kg/m  Physical Exam Vitals and nursing note reviewed.  Constitutional:      General: She is not in acute distress.    Appearance: She is well-developed. She is not diaphoretic.  HENT:     Head: Normocephalic and atraumatic.  Eyes:     Pupils: Pupils are equal, round, and reactive to light.  Cardiovascular:     Rate and Rhythm: Normal rate and regular rhythm.     Heart sounds: No murmur heard.    No friction rub. No gallop.  Pulmonary:     Effort: Pulmonary effort is normal.     Breath sounds: No wheezing or rales.  Abdominal:     General: There is no distension.     Palpations: Abdomen is soft.     Tenderness: There is no abdominal tenderness.  Musculoskeletal:        General: No tenderness.     Cervical back: Normal range of motion and neck supple.     Comments: No midline spinal tenderness step-offs or deformities.   Pulse motor and sensation intact bilateral extremities.  Reflexes are 3+ and equal bilaterally.  No clonus.  Skin:    General: Skin is warm and dry.  Neurological:     Mental Status: She is alert and oriented to person, place, and time.  Psychiatric:        Behavior: Behavior normal.     (all labs ordered are listed, but only abnormal results are displayed) Labs Reviewed - No data to display  EKG: None  Radiology: No results found.   Procedures   Medications Ordered in the ED  acetaminophen  (TYLENOL ) tablet 1,000 mg (has no administration in time range)  ketorolac  (TORADOL ) 15 MG/ML injection 15 mg (has no administration in time range)  oxyCODONE  (Oxy IR/ROXICODONE ) immediate release tablet 5 mg (has no administration in time range)  diazepam  (VALIUM ) tablet 5 mg (has no administration in time range)                                    Medical Decision Making Risk OTC drugs. Prescription drug management.   48 yo F with a remote history of breast cancer remote history of polysubstance abuse comes in with a chief complaints of back pain.  Going on for a few days.  Atraumatic.  Benign exam.  Will treat supportively.  PCP follow-up.  11:39 AM:  I have discussed the diagnosis/risks/treatment options with the patient.  Evaluation and diagnostic testing in the emergency department does not suggest an emergent condition requiring admission or immediate intervention beyond what has been performed at this time.  They will follow up with PCP. We also discussed returning to the ED immediately if new or worsening sx occur. We discussed the sx which are most concerning (e.g., sudden worsening pain, fever, inability to tolerate by mouth, cauda equina s/sx) that necessitate immediate return. Medications administered to the patient during their visit and any new prescriptions provided to the patient are listed below.  Medications given during this visit Medications  acetaminophen  (TYLENOL )  tablet 1,000 mg (has no administration in time range)  ketorolac  (TORADOL ) 15 MG/ML injection 15 mg (has no administration in time range)  oxyCODONE  (Oxy IR/ROXICODONE ) immediate release tablet 5 mg (has no administration in time range)  diazepam  (VALIUM ) tablet 5 mg (has no administration in time range)     The patient appears reasonably screen and/or stabilized for discharge and I doubt  any other medical condition or other [facility] requiring further screening, evaluation, or treatment in the ED at this time prior to discharge.       Final diagnoses:  Acute bilateral low back pain without sciatica    ED Discharge Orders          Ordered    methocarbamol  (ROBAXIN ) 500 MG tablet  2 times daily        11/20/23 1134               Emil Share, DO 11/20/23 1139

## 2023-11-20 NOTE — ED Triage Notes (Signed)
 Pt states lower back pain x 3 days  States pain worse with movement, bending  No urinary complaints  States h/o disc issues in lower back

## 2023-12-13 ENCOUNTER — Emergency Department (HOSPITAL_BASED_OUTPATIENT_CLINIC_OR_DEPARTMENT_OTHER)
Admission: EM | Admit: 2023-12-13 | Discharge: 2023-12-13 | Disposition: A | Discharge status: Home / Self Care | Attending: Emergency Medicine | Admitting: Emergency Medicine

## 2023-12-13 ENCOUNTER — Emergency Department (HOSPITAL_BASED_OUTPATIENT_CLINIC_OR_DEPARTMENT_OTHER)

## 2023-12-13 ENCOUNTER — Other Ambulatory Visit: Payer: Self-pay

## 2023-12-13 ENCOUNTER — Encounter (HOSPITAL_BASED_OUTPATIENT_CLINIC_OR_DEPARTMENT_OTHER): Payer: Self-pay | Admitting: Emergency Medicine

## 2023-12-13 DIAGNOSIS — R1084 Generalized abdominal pain: Secondary | ICD-10-CM | POA: Diagnosis not present

## 2023-12-13 DIAGNOSIS — Z7951 Long term (current) use of inhaled steroids: Secondary | ICD-10-CM | POA: Insufficient documentation

## 2023-12-13 DIAGNOSIS — R112 Nausea with vomiting, unspecified: Secondary | ICD-10-CM | POA: Insufficient documentation

## 2023-12-13 DIAGNOSIS — R197 Diarrhea, unspecified: Secondary | ICD-10-CM | POA: Diagnosis not present

## 2023-12-13 DIAGNOSIS — J449 Chronic obstructive pulmonary disease, unspecified: Secondary | ICD-10-CM | POA: Diagnosis not present

## 2023-12-13 DIAGNOSIS — R109 Unspecified abdominal pain: Secondary | ICD-10-CM | POA: Diagnosis not present

## 2023-12-13 LAB — LIPASE, BLOOD: Lipase: 19 U/L (ref 11–51)

## 2023-12-13 LAB — CBC
HCT: 43.8 % (ref 36.0–46.0)
Hemoglobin: 15.2 g/dL — ABNORMAL HIGH (ref 12.0–15.0)
MCH: 33 pg (ref 26.0–34.0)
MCHC: 34.7 g/dL (ref 30.0–36.0)
MCV: 95 fL (ref 80.0–100.0)
Platelets: 231 K/uL (ref 150–400)
RBC: 4.61 MIL/uL (ref 3.87–5.11)
RDW: 12.7 % (ref 11.5–15.5)
WBC: 12.7 K/uL — ABNORMAL HIGH (ref 4.0–10.5)
nRBC: 0 % (ref 0.0–0.2)

## 2023-12-13 LAB — COMPREHENSIVE METABOLIC PANEL WITH GFR
ALT: 9 U/L (ref 0–44)
AST: 23 U/L (ref 15–41)
Albumin: 4.2 g/dL (ref 3.5–5.0)
Alkaline Phosphatase: 81 U/L (ref 38–126)
Anion gap: 16 — ABNORMAL HIGH (ref 5–15)
BUN: 7 mg/dL (ref 6–20)
CO2: 20 mmol/L — ABNORMAL LOW (ref 22–32)
Calcium: 9.2 mg/dL (ref 8.9–10.3)
Chloride: 104 mmol/L (ref 98–111)
Creatinine, Ser: 0.81 mg/dL (ref 0.44–1.00)
GFR, Estimated: >60 mL/min (ref >60)
Glucose, Bld: 89 mg/dL (ref 70–99)
Potassium: 4 mmol/L (ref 3.5–5.1)
Sodium: 139 mmol/L (ref 135–145)
Total Bilirubin: 0.2 mg/dL (ref 0.0–1.2)
Total Protein: 6.4 g/dL — ABNORMAL LOW (ref 6.5–8.1)

## 2023-12-13 LAB — PREGNANCY, URINE: Preg Test, Ur: NEGATIVE

## 2023-12-13 LAB — URINALYSIS, ROUTINE W REFLEX MICROSCOPIC
Bilirubin Urine: NEGATIVE
Glucose, UA: NEGATIVE mg/dL
Ketones, ur: NEGATIVE mg/dL
Leukocytes,Ua: NEGATIVE
Nitrite: NEGATIVE
Protein, ur: NEGATIVE mg/dL
Specific Gravity, Urine: <=1.005 (ref 1.005–1.030)
pH: 5 (ref 5.0–8.0)

## 2023-12-13 LAB — URINALYSIS, MICROSCOPIC (REFLEX)

## 2023-12-13 MED ORDER — IOHEXOL 300 MG/ML  SOLN
75.0000 mL | Freq: Once | INTRAMUSCULAR | Status: AC | PRN
  Administered 2023-12-13 (×2): 75 mL via INTRAVENOUS

## 2023-12-13 MED ORDER — DICYCLOMINE HCL 20 MG PO TABS: 20.0000 mg | ORAL_TABLET | Freq: Two times a day (BID) | ORAL | 0 refills | Status: DC

## 2023-12-13 MED ORDER — PROMETHAZINE HCL 25 MG PO TABS: 25.0000 mg | ORAL_TABLET | Freq: Four times a day (QID) | ORAL | 0 refills | Status: DC | PRN

## 2023-12-13 MED ORDER — DIPHENHYDRAMINE HCL 50 MG/ML IJ SOLN
25.0000 mg | Freq: Once | INTRAMUSCULAR | Status: AC
  Administered 2023-12-13 (×2): 25 mg via INTRAVENOUS
  Filled 2023-12-13: qty 1

## 2023-12-13 MED ORDER — DICYCLOMINE HCL 10 MG PO CAPS
10.0000 mg | ORAL_CAPSULE | Freq: Once | ORAL | Status: AC
  Administered 2023-12-13 (×2): 10 mg via ORAL
  Filled 2023-12-13: qty 1

## 2023-12-13 MED ORDER — METOCLOPRAMIDE HCL 5 MG/ML IJ SOLN
10.0000 mg | Freq: Once | INTRAMUSCULAR | Status: AC
  Administered 2023-12-13 (×2): 10 mg via INTRAVENOUS
  Filled 2023-12-13: qty 2

## 2023-12-13 MED ORDER — PANTOPRAZOLE SODIUM 20 MG PO TBEC: 20.0000 mg | DELAYED_RELEASE_TABLET | Freq: Every day | ORAL | 0 refills | Status: DC

## 2023-12-13 MED ORDER — ONDANSETRON HCL 4 MG/2ML IJ SOLN
4.0000 mg | Freq: Once | INTRAMUSCULAR | Status: AC
  Administered 2023-12-13 (×2): 4 mg via INTRAVENOUS
  Filled 2023-12-13: qty 2

## 2023-12-13 MED ORDER — MORPHINE SULFATE (PF) 4 MG/ML IV SOLN
4.0000 mg | Freq: Once | INTRAVENOUS | Status: AC
  Administered 2023-12-13 (×2): 4 mg via INTRAVENOUS
  Filled 2023-12-13: qty 1

## 2023-12-13 MED ORDER — ONDANSETRON 4 MG PO TBDP: 4.0000 mg | ORAL_TABLET | Freq: Three times a day (TID) | ORAL | 0 refills | Status: DC | PRN

## 2023-12-13 MED ORDER — ONDANSETRON 4 MG PO TBDP
4.0000 mg | ORAL_TABLET | Freq: Once | ORAL | Status: AC | PRN
  Administered 2023-12-13 (×2): 4 mg via ORAL
  Filled 2023-12-13: qty 1

## 2023-12-13 MED ORDER — SODIUM CHLORIDE 0.9 % IV BOLUS
1000.0000 mL | Freq: Once | INTRAVENOUS | Status: AC
  Administered 2023-12-13 (×2): 1000 mL via INTRAVENOUS

## 2023-12-13 NOTE — ED Provider Notes (Signed)
  EMERGENCY DEPARTMENT AT [facility] Provider Note   CSN: [id number] Arrival date & time: 12/13/23  1325     Patient presents with: Emesis   Crystal [name] is a 48 y.o. female with PMHx seizures, migraines, COPD who presents to ED concerned for intermittent nausea and vomiting x2 weeks. Husband with same symptoms. Patient staing that she is now noticing mold in the house that she has been living in and wonders if this is related. Patient took Omeprazole and Zofran  earlier today and still feels nauseated. Patient also with intermittent constipation and diarrhea x2 weeks along with increased gas and generalized abdominal cramping. Patient stating that similar symptoms happened to her in the past year which resolved with Bentyl . Last episode of emesis was earlier this morning. Last BM this morning. Patient denies hematemesis or hematochezia.      Emesis      Prior to Admission medications   Medication Sig Start Date End Date Taking? Authorizing Provider  albuterol  (VENTOLIN  HFA) 108 (90 Base) MCG/ACT inhaler INHALE 2 PUFFS INTO THE LUNGS EVERY 4 HOURS AS NEEDED FOR WHEEZING OR SHORTNESS OF BREATH. Patient taking differently: Inhale 2 puffs into the lungs as needed for wheezing or shortness of breath. 10/28/21   Cobb, Comer GAILS, NP  ALPRAZolam  (XANAX ) 1 MG tablet Take 1 mg by mouth 4 (four) times daily.    [provider]  dicyclomine  (BENTYL ) 20 MG tablet Take 1 tablet (20 mg total) by mouth 2 (two) times daily. 12/13/23   Hoy Nidia FALCON, PA-C  Eszopiclone 3 MG TABS Take 3 mg by mouth at bedtime. Take immediately before bedtime Patient not taking: Reported on 09/20/2022    [provider]  gabapentin  (NEURONTIN ) 600 MG tablet Take 600 mg by mouth 2 (two) times daily. 3 tabs in AM, 2 tabs in PM    [provider]  hydrOXYzine  (ATARAX ) 25 MG tablet Take 1 tablet (25 mg total) by mouth every 6 (six) hours. 11/20/22   Clark, Meghan R, PA-C   loratadine  (CLARITIN ) 10 MG tablet Take 10 mg by mouth daily.    [provider]  methocarbamol  (ROBAXIN ) 500 MG tablet Take 1 tablet (500 mg total) by mouth 2 (two) times daily. 11/20/23   Floyd, Dan, DO  ondansetron  (ZOFRAN ) 4 MG tablet Take 1 tablet (4 mg total) by mouth every 8 (eight) hours as needed for nausea or vomiting. 11/20/22   Clark, Meghan R, PA-C  ondansetron  (ZOFRAN -ODT) 4 MG disintegrating tablet Take 1 tablet (4 mg total) by mouth every 8 (eight) hours as needed for nausea. 12/13/23  Yes Hoy Nidia F, PA-C  pantoprazole  (PROTONIX ) 20 MG tablet Take 1 tablet (20 mg total) by mouth daily. 12/13/23   Hoy Nidia FALCON, PA-C  promethazine  (PHENERGAN ) 25 MG tablet Take 1 tablet (25 mg total) by mouth every 6 (six) hours as needed for up to 5 doses for nausea or vomiting. 12/13/23   Hoy Nidia FALCON, PA-C  saccharomyces boulardii (FLORASTOR) 250 MG capsule Take 500 mg by mouth 2 (two) times daily.    [provider]  sucralfate  (CARAFATE ) 1 g tablet Take 1 tablet (1 g total) by mouth 4 (four) times daily -  with meals and at bedtime. 11/20/22   Gretta Gerard SAUNDERS, PA-C    Allergies: Thorazine [chlorpromazine hcl], Tramadol , Phenytoin , and Xarelto  [rivaroxaban ]    Review of Systems  Gastrointestinal:  Positive for vomiting.    Updated Vital Signs BP 118/85   Pulse 84  Temp 98.3 F (36.8 C) (Oral)   Resp 18   Ht 5' 4 (1.626 m)   Wt 55.8 kg   SpO2 100%   BMI 21.11 kg/m   Physical Exam Vitals and nursing note reviewed.  Constitutional:      General: She is not in acute distress.    Appearance: She is not ill-appearing or toxic-appearing.  HENT:     Head: Normocephalic and atraumatic.     Mouth/Throat:     Mouth: Mucous membranes are moist.  Eyes:     General: No scleral icterus.       Right eye: No discharge.        Left eye: No discharge.     Conjunctiva/sclera: Conjunctivae normal.  Cardiovascular:     Rate and Rhythm: Normal rate and  regular rhythm.     Pulses: Normal pulses.     Heart sounds: Normal heart sounds. No murmur heard. Pulmonary:     Effort: Pulmonary effort is normal. No respiratory distress.     Breath sounds: Normal breath sounds. No wheezing, rhonchi or rales.  Abdominal:     General: Abdomen is flat. There is no distension.     Palpations: Abdomen is soft. There is no mass.     Tenderness: There is no abdominal tenderness.     Comments: Increased bowel sounds  Musculoskeletal:     Right lower leg: No edema.     Left lower leg: No edema.  Skin:    General: Skin is warm and dry.     Findings: No rash.  Neurological:     General: No focal deficit present.     Mental Status: She is alert and oriented to person, place, and time. Mental status is at baseline.  Psychiatric:        Mood and Affect: Mood normal.        Behavior: Behavior normal.     (all labs ordered are listed, but only abnormal results are displayed) Labs Reviewed  COMPREHENSIVE METABOLIC PANEL WITH GFR - Abnormal; Notable for the following components:      Result Value   CO2 20 (*)    Total Protein 6.4 (*)    Anion gap 16 (*)    All other components within normal limits  CBC - Abnormal; Notable for the following components:   WBC 12.7 (*)    Hemoglobin 15.2 (*)    All other components within normal limits  URINALYSIS, ROUTINE W REFLEX MICROSCOPIC - Abnormal; Notable for the following components:   Hgb urine dipstick SMALL (*)    All other components within normal limits  URINALYSIS, MICROSCOPIC (REFLEX) - Abnormal; Notable for the following components:   Bacteria, UA RARE (*)    All other components within normal limits  LIPASE, BLOOD  PREGNANCY, URINE    EKG: None  Radiology: CT ABDOMEN PELVIS W CONTRAST Result Date: 12/13/2023 CLINICAL DATA:  Abdominal pain, acute, nonlocalized EXAM: CT ABDOMEN AND PELVIS WITH CONTRAST TECHNIQUE: Multidetector CT imaging of the abdomen and pelvis was performed using the standard  protocol following bolus administration of intravenous contrast. RADIATION DOSE REDUCTION: This exam was performed according to the departmental dose-optimization program which includes automated exposure control, adjustment of the mA and/or kV according to patient size and/or use of iterative reconstruction technique. CONTRAST:  75mL OMNIPAQUE  IOHEXOL  300 MG/ML  SOLN COMPARISON:  November 20, 2022 FINDINGS: Lower chest: No focal airspace consolidation or pleural effusion. Hepatobiliary: No mass.Focal fatty infiltration along the falciform ligament.No radiopaque stones  or wall thickening of the gallbladder. No intrahepatic or extrahepatic biliary ductal dilation. The portal veins are patent. Pancreas: No mass or main ductal dilation. No peripancreatic inflammation or fluid collection. Spleen: Normal size. No mass. Adrenals/Urinary Tract: No adrenal masses. No renal mass. No nephrolithiasis or hydronephrosis. The urinary bladder is obscured by metallic streak artifact from orthopedic hardware. No focal abnormality visualized. Stomach/Bowel: The stomach is decompressed without focal abnormality. No small bowel wall thickening or inflammation. No small bowel obstruction.Normal appendix. Vascular/Lymphatic: No aortic aneurysm. No intraabdominal or pelvic lymphadenopathy. Reproductive: The uterus and ovaries are within normal limits for patient's age.Trace free pelvic fluid. Other: No pneumoperitoneum, ascites, or mesenteric inflammation. Musculoskeletal: No acute fracture or destructive lesion. Bilateral hip arthroplasties are well aligned without dislocation. IMPRESSION: 1. No acute intra-abdominal or pelvic abnormality. 2. Trace free fluid in the pelvis, likely physiologic in a female of this age. Electronically Signed   By: Rogelia Myers M.D.   On: 12/13/2023 20:00     Procedures   Medications Ordered in the ED  ondansetron  (ZOFRAN -ODT) disintegrating tablet 4 mg (4 mg Oral Given 12/13/23 1348)  diphenhydrAMINE   (BENADRYL ) injection 25 mg (25 mg Intravenous Given 12/13/23 1549)  metoCLOPramide  (REGLAN ) injection 10 mg (10 mg Intravenous Given 12/13/23 1549)  sodium chloride  0.9 % bolus 1,000 mL (0 mLs Intravenous Stopped 12/13/23 1724)  dicyclomine  (BENTYL ) capsule 10 mg (10 mg Oral Given 12/13/23 1630)  morphine  (PF) 4 MG/ML injection 4 mg (4 mg Intravenous Given 12/13/23 1805)  ondansetron  (ZOFRAN ) injection 4 mg (4 mg Intravenous Given 12/13/23 1805)  iohexol  (OMNIPAQUE ) 300 MG/ML solution 75 mL (75 mLs Intravenous Contrast Given 12/13/23 1747)                                    Medical Decision Making Amount and/or Complexity of Data Reviewed Labs: ordered. Radiology: ordered.  Risk Prescription drug management.    This patient presents to the ED for concern of abdominal pain, this involves an extensive number of treatment options, and is a complaint that carries with it a high risk of complications and morbidity.  The differential diagnosis includes gastroenteritis, colitis, small bowel obstruction, appendicitis, cholecystitis, pancreatitis, nephrolithiasis, UTI, pyelonephritis   Co morbidities that complicate the patient evaluation  seizures, migraines, COPD    Additional history obtained:  06/2022 and 10/2022 CT abd/pelv without acute findings.   Problem List / ED Course / Critical interventions / Medication management  Patient presented for intermittent nausea, vomiting, constipation, diarrhea, abdominal cramping that has been progressing over the past 2 weeks.  Patient wondering if this is due to mold inside the house that she has been living in.  Patient's husband with similar symptoms.  Last episode of emesis was earlier this morning.  Patient took omeprazole and Zofran  without complete relief in her nausea symptoms.  Last bowel movement this morning. I Ordered, and personally interpreted labs.  UA not concerning for infection. CBC with nonspecific leukocytosis at 12.7. no anemia. CMP  with mildly low CO2 at 20 and mild anion gap at 16. Lipase within normal limits. UPT negative.  Patient later having worsening abdominal pain so I decided to obtain imaging. CT abd/pelv without acute abnormality.  Patient now tolerating PO after benadryl , reglan , morphine , bentyl , and IV fluids. Patient asking for bentyl , protonix , and zofran  to help her symptoms at home which I have prescribed. Patient to follow up with PCP. I have reviewed  the patients home medicines and have made adjustments as needed The patient has been appropriately medically screened and/or stabilized in the ED. I have low suspicion for any other emergent medical condition which would require further screening, evaluation or treatment in the ED or require inpatient management. At time of discharge the patient is hemodynamically stable and in no acute distress. I have discussed work-up results and diagnosis with patient and answered all questions. Patient is agreeable with discharge plan. We discussed strict return precautions for returning to the emergency department and they verbalized understanding.     Social Determinants of Health:  none       Final diagnoses:  Nausea vomiting and diarrhea  Generalized abdominal pain    ED Discharge Orders          Ordered    dicyclomine  (BENTYL ) 20 MG tablet  2 times daily        12/13/23 2040    pantoprazole  (PROTONIX ) 20 MG tablet  Daily        12/13/23 2040    promethazine  (PHENERGAN ) 25 MG tablet  Every 6 hours PRN        12/13/23 2040    ondansetron  (ZOFRAN -ODT) 4 MG disintegrating tablet  Every 8 hours PRN        12/13/23 2040               Hoy Nidia FALCON, PA-C 12/13/23 2040    Simon Lavonia SAILOR, MD 12/13/23 [phone]

## 2023-12-13 NOTE — Discharge Instructions (Addendum)
 I am glad you are feeling better. Please follow up with primary care. Seek emergency care if experiencing any new or worsening symptoms.   Alternating between 650 mg Tylenol  and 400 mg Advil: The best way to alternate taking Acetaminophen  (example Tylenol ) and Ibuprofen (example Advil/Motrin) is to take them 3 hours apart. For example, if you take ibuprofen at 6 am you can then take Tylenol  at 9 am. You can continue this regimen throughout the day, making sure you do not exceed the recommended maximum dose for each drug.

## 2023-12-13 NOTE — ED Notes (Signed)
 Attempted to start IV. Attempted 4x all sticks were unsuccessful.

## 2023-12-13 NOTE — ED Triage Notes (Signed)
 Pt c/o n/v x 2 weeks. Intermittent constipation and diarrhea.   Reports having mold in home

## 2024-01-08 ENCOUNTER — Encounter (HOSPITAL_BASED_OUTPATIENT_CLINIC_OR_DEPARTMENT_OTHER): Payer: Self-pay | Admitting: Emergency Medicine

## 2024-01-08 ENCOUNTER — Other Ambulatory Visit: Payer: Self-pay

## 2024-01-08 ENCOUNTER — Emergency Department (HOSPITAL_BASED_OUTPATIENT_CLINIC_OR_DEPARTMENT_OTHER)

## 2024-01-08 ENCOUNTER — Emergency Department (HOSPITAL_BASED_OUTPATIENT_CLINIC_OR_DEPARTMENT_OTHER)
Admission: EM | Admit: 2024-01-08 | Discharge: 2024-01-08 | Disposition: A | Discharge status: Home / Self Care | Attending: Emergency Medicine | Admitting: Emergency Medicine

## 2024-01-08 DIAGNOSIS — J449 Chronic obstructive pulmonary disease, unspecified: Secondary | ICD-10-CM | POA: Insufficient documentation

## 2024-01-08 DIAGNOSIS — G8929 Other chronic pain: Secondary | ICD-10-CM | POA: Diagnosis not present

## 2024-01-08 DIAGNOSIS — R110 Nausea: Secondary | ICD-10-CM | POA: Insufficient documentation

## 2024-01-08 DIAGNOSIS — R42 Dizziness and giddiness: Secondary | ICD-10-CM | POA: Diagnosis not present

## 2024-01-08 DIAGNOSIS — R051 Acute cough: Secondary | ICD-10-CM | POA: Insufficient documentation

## 2024-01-08 DIAGNOSIS — Z853 Personal history of malignant neoplasm of breast: Secondary | ICD-10-CM | POA: Diagnosis not present

## 2024-01-08 DIAGNOSIS — Z59819 Housing instability, housed unspecified: Secondary | ICD-10-CM | POA: Diagnosis not present

## 2024-01-08 DIAGNOSIS — Z79899 Other long term (current) drug therapy: Secondary | ICD-10-CM | POA: Insufficient documentation

## 2024-01-08 DIAGNOSIS — R000 Tachycardia, unspecified: Secondary | ICD-10-CM | POA: Diagnosis not present

## 2024-01-08 DIAGNOSIS — M549 Dorsalgia, unspecified: Secondary | ICD-10-CM | POA: Diagnosis present

## 2024-01-08 DIAGNOSIS — R0981 Nasal congestion: Secondary | ICD-10-CM | POA: Insufficient documentation

## 2024-01-08 DIAGNOSIS — M5442 Lumbago with sciatica, left side: Secondary | ICD-10-CM | POA: Insufficient documentation

## 2024-01-08 DIAGNOSIS — R058 Other specified cough: Secondary | ICD-10-CM | POA: Diagnosis not present

## 2024-01-08 DIAGNOSIS — R5383 Other fatigue: Secondary | ICD-10-CM | POA: Diagnosis present

## 2024-01-08 LAB — COMPREHENSIVE METABOLIC PANEL WITH GFR
ALT: 13 U/L (ref 0–44)
AST: 23 U/L (ref 15–41)
Albumin: 4.6 g/dL (ref 3.5–5.0)
Alkaline Phosphatase: 69 U/L (ref 38–126)
Anion gap: 14 (ref 5–15)
BUN: 16 mg/dL (ref 6–20)
CO2: 24 mmol/L (ref 22–32)
Calcium: 9.7 mg/dL (ref 8.9–10.3)
Chloride: 104 mmol/L (ref 98–111)
Creatinine, Ser: 0.89 mg/dL (ref 0.44–1.00)
GFR, Estimated: >60 mL/min (ref >60)
Glucose, Bld: 126 mg/dL — ABNORMAL HIGH (ref 70–99)
Potassium: 3.6 mmol/L (ref 3.5–5.1)
Sodium: 142 mmol/L (ref 135–145)
Total Bilirubin: 0.3 mg/dL (ref 0.0–1.2)
Total Protein: 6.6 g/dL (ref 6.5–8.1)

## 2024-01-08 LAB — CBC WITH DIFFERENTIAL/PLATELET
Abs Immature Granulocytes: 0.01 K/uL (ref 0.00–0.07)
Basophils Absolute: 0 K/uL (ref 0.0–0.1)
Basophils Relative: 1 %
Eosinophils Absolute: 0.2 K/uL (ref 0.0–0.5)
Eosinophils Relative: 3 %
HCT: 41.8 % (ref 36.0–46.0)
Hemoglobin: 14.7 g/dL (ref 12.0–15.0)
Immature Granulocytes: 0 %
Lymphocytes Relative: 23 %
Lymphs Abs: 1.7 K/uL (ref 0.7–4.0)
MCH: 32.2 pg (ref 26.0–34.0)
MCHC: 35.2 g/dL (ref 30.0–36.0)
MCV: 91.5 fL (ref 80.0–100.0)
Monocytes Absolute: 0.4 K/uL (ref 0.1–1.0)
Monocytes Relative: 6 %
Neutro Abs: 5.2 K/uL (ref 1.7–7.7)
Neutrophils Relative %: 67 %
Platelets: 217 K/uL (ref 150–400)
RBC: 4.57 MIL/uL (ref 3.87–5.11)
RDW: 12.1 % (ref 11.5–15.5)
WBC: 7.7 K/uL (ref 4.0–10.5)
nRBC: 0 % (ref 0.0–0.2)

## 2024-01-08 LAB — URINE DRUG SCREEN
Amphetamines: NEGATIVE
Barbiturates: NEGATIVE
Benzodiazepines: NEGATIVE
Cocaine: NEGATIVE
Fentanyl: NEGATIVE
Methadone Scn, Ur: NEGATIVE
Opiates: NEGATIVE
Tetrahydrocannabinol: POSITIVE — AB

## 2024-01-08 LAB — URINALYSIS, W/ REFLEX TO CULTURE (INFECTION SUSPECTED)
Bilirubin Urine: NEGATIVE
Glucose, UA: NEGATIVE mg/dL
Ketones, ur: NEGATIVE mg/dL
Leukocytes,Ua: NEGATIVE
Nitrite: NEGATIVE
Protein, ur: NEGATIVE mg/dL
Specific Gravity, Urine: 1.02 (ref 1.005–1.030)
WBC, UA: NONE SEEN WBC/hpf (ref 0–5)
pH: 5.5 (ref 5.0–8.0)

## 2024-01-08 LAB — RESP PANEL BY RT-PCR (RSV, FLU A&B, COVID)  RVPGX2
Influenza A by PCR: NEGATIVE
Influenza B by PCR: NEGATIVE
Resp Syncytial Virus by PCR: NEGATIVE
SARS Coronavirus 2 by RT PCR: NEGATIVE

## 2024-01-08 LAB — HCG, SERUM, QUALITATIVE: Preg, Serum: NEGATIVE

## 2024-01-08 MED ORDER — SODIUM CHLORIDE 0.9 % IV BOLUS
1000.0000 mL | Freq: Once | INTRAVENOUS | Status: AC
  Administered 2024-01-08: 1000 mL via INTRAVENOUS

## 2024-01-08 MED ORDER — DEXAMETHASONE SODIUM PHOSPHATE 10 MG/ML IJ SOLN
10.0000 mg | Freq: Once | INTRAMUSCULAR | Status: AC
  Administered 2024-01-08: 10 mg via INTRAVENOUS
  Filled 2024-01-08: qty 1

## 2024-01-08 MED ORDER — KETOROLAC TROMETHAMINE 15 MG/ML IJ SOLN
15.0000 mg | Freq: Once | INTRAMUSCULAR | Status: AC
  Administered 2024-01-08: 15 mg via INTRAVENOUS
  Filled 2024-01-08: qty 1

## 2024-01-08 NOTE — ED Provider Notes (Addendum)
 [name] Provider Note   CSN: [id number] Arrival date & time: 01/08/24  1532     Patient presents with: Multiple Complaints   Crystal [name] is a 48 y.o. female.   Patient is a 48 year old female with a history of bipolar disorder, COPD, migraines, seizures, prior breast cancer, chronic back pain who presents with multiple complaints.  She says she has had vertigo in the past and she has had some vertigo-like symptoms for about 2 months.  She says it has been worse over the last 2 weeks.  It is mostly anytime she stands up suddenly then she feels like the room spinning.  Does not is worse with head movement but sometimes it does get precipitated by head movement.  She has been taking Dramamine without improvement in symptoms.  She has had some nasal congestion as well as a cough which is productive of sputum.  No hemoptysis.  No shortness of breath.  No known fevers.  She has had a decreased appetite and some nausea.  She also has exacerbation of her chronic back pain.  She has some shooting pains down her left leg.  Sometimes she will have some numbness in her leg but this is more of a chronic issue.  No urinary symptoms.  No loss of bowel or bladder control.       Prior to Admission medications   Medication Sig Start Date End Date Taking? Authorizing Provider  albuterol  (VENTOLIN  HFA) 108 (90 Base) MCG/ACT inhaler INHALE 2 PUFFS INTO THE LUNGS EVERY 4 HOURS AS NEEDED FOR WHEEZING OR SHORTNESS OF BREATH. Patient taking differently: Inhale 2 puffs into the lungs as needed for wheezing or shortness of breath. 10/28/21   Cobb, Comer GAILS, NP  ALPRAZolam  (XANAX ) 1 MG tablet Take 1 mg by mouth 4 (four) times daily.    [provider]  dicyclomine  (BENTYL ) 20 MG tablet Take 1 tablet (20 mg total) by mouth 2 (two) times daily. 12/13/23   Hoy Nidia FALCON, PA-C  Eszopiclone 3 MG TABS Take 3 mg by mouth at bedtime. Take immediately before  bedtime Patient not taking: Reported on 09/20/2022    [provider]  gabapentin  (NEURONTIN ) 600 MG tablet Take 600 mg by mouth 2 (two) times daily. 3 tabs in AM, 2 tabs in PM    [provider]  hydrOXYzine  (ATARAX ) 25 MG tablet Take 1 tablet (25 mg total) by mouth every 6 (six) hours. 11/20/22   Clark, Meghan R, PA-C  loratadine  (CLARITIN ) 10 MG tablet Take 10 mg by mouth daily.    [provider]  methocarbamol  (ROBAXIN ) 500 MG tablet Take 1 tablet (500 mg total) by mouth 2 (two) times daily. 11/20/23   Floyd, Dan, DO  ondansetron  (ZOFRAN ) 4 MG tablet Take 1 tablet (4 mg total) by mouth every 8 (eight) hours as needed for nausea or vomiting. 11/20/22   Clark, Meghan R, PA-C  ondansetron  (ZOFRAN -ODT) 4 MG disintegrating tablet Take 1 tablet (4 mg total) by mouth every 8 (eight) hours as needed for nausea. 12/13/23   Hoy Nidia FALCON, PA-C  pantoprazole  (PROTONIX ) 20 MG tablet Take 1 tablet (20 mg total) by mouth daily. 12/13/23   Hoy Nidia FALCON, PA-C  promethazine  (PHENERGAN ) 25 MG tablet Take 1 tablet (25 mg total) by mouth every 6 (six) hours as needed for up to 5 doses for nausea or vomiting. 12/13/23   Hoy Nidia FALCON, PA-C  saccharomyces boulardii (FLORASTOR) 250 MG capsule  Take 500 mg by mouth 2 (two) times daily.    [provider]  sucralfate  (CARAFATE ) 1 g tablet Take 1 tablet (1 g total) by mouth 4 (four) times daily -  with meals and at bedtime. 11/20/22   Gretta Gerard SAUNDERS, PA-C    Allergies: Thorazine [chlorpromazine hcl], Tramadol , Phenytoin , and Xarelto  [rivaroxaban ]    Review of Systems  Constitutional:  Positive for fatigue. Negative for chills, diaphoresis and fever.  HENT:  Positive for congestion and rhinorrhea. Negative for sneezing.   Eyes: Negative.   Respiratory:  Positive for cough. Negative for chest tightness and shortness of breath.   Cardiovascular:  Negative for chest pain and leg swelling.  Gastrointestinal:  Positive  for nausea. Negative for abdominal pain, blood in stool, diarrhea and vomiting.  Genitourinary:  Negative for difficulty urinating, flank pain, frequency and hematuria.  Musculoskeletal:  Positive for back pain. Negative for arthralgias.  Skin:  Negative for rash.  Neurological:  Positive for dizziness and numbness (Left leg, intermittently). Negative for speech difficulty, weakness and headaches.    Updated Vital Signs BP 126/88 (BP Location: Left Arm)   Pulse (!) 55   Temp 98.3 F (36.8 C) (Oral)   Resp 16   Ht 5' 4 (1.626 m)   Wt 52.6 kg   LMP  (LMP Unknown)   SpO2 100%   BMI 19.91 kg/m   Physical Exam Constitutional:      Appearance: She is well-developed.  HENT:     Head: Normocephalic and atraumatic.     Mouth/Throat:     Pharynx: Oropharynx is clear.  Eyes:     Extraocular Movements: Extraocular movements intact.     Pupils: Pupils are equal, round, and reactive to light.     Comments: No nystagmus visualized  Cardiovascular:     Rate and Rhythm: Regular rhythm. Tachycardia present.     Heart sounds: Normal heart sounds.  Pulmonary:     Effort: Pulmonary effort is normal. No respiratory distress.     Breath sounds: Normal breath sounds. No wheezing or rales.  Chest:     Chest wall: No tenderness.  Abdominal:     General: Bowel sounds are normal.     Palpations: Abdomen is soft.     Tenderness: There is no abdominal tenderness. There is no guarding or rebound.  Musculoskeletal:        General: Normal range of motion.     Cervical back: Normal range of motion and neck supple.  Lymphadenopathy:     Cervical: No cervical adenopathy.  Skin:    General: Skin is warm and dry.     Findings: No rash.  Neurological:     Mental Status: She is alert and oriented to person, place, and time.     Comments: Motor 5/5 all extremities Sensation grossly intact to LT all extremities Finger to Nose intact, no pronator drift CN II-XII grossly intact Gait normal       (all labs ordered are listed, but only abnormal results are displayed) Labs Reviewed  COMPREHENSIVE METABOLIC PANEL WITH GFR - Abnormal; Notable for the following components:      Result Value   Glucose, Bld 126 (*)    All other components within normal limits  URINALYSIS, W/ REFLEX TO CULTURE (INFECTION SUSPECTED) - Abnormal; Notable for the following components:   Hgb urine dipstick SMALL (*)    Bacteria, UA RARE (*)    All other components within normal limits  URINE DRUG SCREEN - Abnormal;  Notable for the following components:   Tetrahydrocannabinol POSITIVE (*)    All other components within normal limits  RESP PANEL BY RT-PCR (RSV, FLU A&B, COVID)  RVPGX2  CBC WITH DIFFERENTIAL/PLATELET  HCG, SERUM, QUALITATIVE    EKG: None  Radiology: DG Chest 2 View Result Date: 01/08/2024 CLINICAL DATA:  Productive cough x2 weeks. EXAM: CHEST - 2 VIEW COMPARISON:  April 29, 2021 FINDINGS: The heart size and mediastinal contours are within normal limits. Both lungs are clear. Surgical clips are seen along the right axilla. The visualized skeletal structures are unremarkable. IMPRESSION: No active cardiopulmonary disease. Electronically Signed   By: Suzen Dials M.D.   On: 01/08/2024 16:11     Procedures   Medications Ordered in the ED  sodium chloride  0.9 % bolus 1,000 mL (0 mLs Intravenous Stopped 01/08/24 1739)  ketorolac  (TORADOL ) 15 MG/ML injection 15 mg (15 mg Intravenous Given 01/08/24 1638)  dexamethasone  (DECADRON ) injection 10 mg (10 mg Intravenous Given 01/08/24 1728)                                    Medical Decision Making Amount and/or Complexity of Data Reviewed Labs: ordered. Radiology: ordered.  Risk Prescription drug management.   This patient presents to the ED for concern of dizziness, back pain, cough, this involves an extensive number of treatment options, and is a complaint that carries with it a high risk of complications and morbidity.  I  considered the following differential and admission for this acute, potentially life threatening condition.  The differential diagnosis includes vertigo, dehydration, vasovagal episodes, stroke, intracranial hemorrhage, migraine, pneumonia, URI, spinal epidural abscess, musculoskeletal back pain, herniated disc  MDM:    Patient is a 48 year old who presents with 3 main complaints.  She has some dizziness which she describes as vertigo.  She has had it before.  However it seems to be worse when she stands up.  She does not have any nystagmus on exam.  No associated headache.  No other neurologic deficits which would be more concerning for intracranial process.  She able to ambulate without ataxia.  Labs reviewed and are nonconcerning.  She was tachycardic on arrival but this resolved after IV fluids.  Her UDS is negative other than THC.  Given that she does not have persistent tachycardia, would doubt hyperthyroid.  Urine is not consistent with infection.  She had a chest x-ray which does not show any evidence of pneumonia.  Her lungs are clear without wheezing.  She has no hypoxia or persistent tachycardia.  No other symptoms that would be more concerning for PE.  Her back pain seems to be typical for her.  She does not have any neurologic deficits or signs of cauda equina.  No fever.  She was given IV fluids as well as Decadron  and Toradol .  Overall is feeling better.  Her vital signs have normalized.  She was discharged home in good condition.  Was encouraged to follow-up with her PCP.  Return precautions were given.  (Labs, imaging, consults)  Labs: I Ordered, and personally interpreted labs.  The pertinent results include: No anemia, pregnancy test negative, urine not consistent with infection, UDS positive for THC  Imaging Studies ordered: I ordered imaging studies including chest x-ray I independently visualized and interpreted imaging. I agree with the radiologist interpretation  Additional  history obtained from chart.  External records from outside source obtained and reviewed including  prior notes  Cardiac Monitoring: The patient was maintained on a cardiac monitor.  If on the cardiac monitor, I personally viewed and interpreted the cardiac monitored which showed an underlying rhythm of: Sinus rhythm  Reevaluation: After the interventions noted above, I reevaluated the patient and found that they have :improved  Social Determinants of Health:  housing insecurity, substance use    Disposition: Discharged to home  Co morbidities that complicate the patient evaluation  Past Medical History:  Diagnosis Date   Alcohol abuse    Anorexia nervosa    Anxiety    Avascular necrosis of bone of hip (HCC)    Bipolar affective disorder, mixed (HCC)    Breast cancer (HCC)    Right   C. difficile diarrhea    hx of   Chronic pain    Depression    Dilated cardiomyopathy secondary to drug (HCC)    DJD (degenerative joint disease)    BONE STICKS OUT ON LEFT PART OF LOWER BACK   DVT of upper extremity (deep vein thrombosis) (HCC) 2014   Edema of upper extremity    Family history of anesthesia complication    MOTHER PONV   GERD (gastroesophageal reflux disease)    OCCASIONALLY TAKE ZANTAC   History of breast cancer DX JUNE 2012 W/ RIGHT BREAST INVASIVE DUCTAL CA  STAGE IIB---  S/P BILATERAL MASTECTOMIES AND RIGHT NODE DISSECTION   ONCOLOGIST-- DR LAYLA--  CHEMO ENDED DEC 2012 / RADIATION ENDED MAR 2013--  CURRENT ON TAMOXIFEN  AND ZOLADEX    History of idiopathic seizure    DX 2008 --  LAST ONE 2012--  NO ISSUES SINCE AND NO MEDS. -- PT STATES DOCTORS FELT IT WAS STRESS RELATED DUE TO HEALTH ISSUES   Hot flashes due to tamoxifen     Hx MRSA infection 03/28/2011   Skin   Left ovarian cyst    Migraines    Neuropathy    Pneumonia    Seizures (HCC)    2024 last seizure- patient reports it was all related to my extreme stress level; takes Xanax  for this condition   Status  post chemotherapy    docetaxel /carboplatin /trastuzumab      Medicines Meds ordered this encounter  Medications   sodium chloride  0.9 % bolus 1,000 mL   ketorolac  (TORADOL ) 15 MG/ML injection 15 mg   dexamethasone  (DECADRON ) injection 10 mg    I have reviewed the patients home medicines and have made adjustments as needed  Problem List / ED Course: Problem List Items Addressed This Visit       Other   Back pain   Other Visit Diagnoses       Dizziness    -  Primary     Acute cough                    Final diagnoses:  Dizziness  Acute cough  Acute left-sided low back pain with left-sided sciatica    ED Discharge Orders     None          Lenor Hollering, MD 01/08/24 1814    Lenor Hollering, MD 01/08/24 1814

## 2024-01-08 NOTE — Discharge Instructions (Addendum)
Follow-up with your primary care doctor.  Return to the emergency room if you have any worsening symptoms

## 2024-01-08 NOTE — ED Triage Notes (Signed)
 Pt c/o vertigo x 2 months, worse x 2 weeks.   C/o mid back pain and numbness that radiates down L leg, intermittent x years.   Productive cough x 2 weeks. Denies fever. + smoker.

## 2024-02-23 NOTE — Progress Notes (Signed)
 [facility] [address] [place], [place] [address] [phone]  Date:  02/26/2024   Name:  Crystal [name]   DOB:  [birth year]   MRN:  [id number]  PCP:  Watt Harlene BROCKS, MD    Chief Complaint: Follow-up (R knee pain an injury from many years ago but it hurts /L shoulder pain I've been doing push ups and I think I pulled something/Referral to OBGYN )   History of Present Illness:  Crystal [name] is a 48 y.o. very pleasant female patient who presents with the following:  Patient seen today for follow-up.  I saw her most recently in August 2023 which point she was planning right hip replacement for avascular necrosis History of breast cancer 2012, status post bilateral mastectomy  Discussed the use of AI scribe software for clinical note transcription with the patient, who gave verbal consent to proceed.  History of Present Illness Crystal [name] is a 48 year old female who presents with knee and shoulder pain.  She experiences significant knee pain, which has persisted since her previous surgeries. She underwent meniscal surgery followed by a fracture that required hardware placement in 2019. The hardware was removed in 2020 due to concerns of skin protrusion. The knee feels 'indented' and painful, especially when walking or dancing. She is uncertain if the pain is due to arthritis or improper healing. The knee sometimes feels swollen and 'hollowish.'  She reports recent onset of left shoulder pain, which began two days ago after performing push-ups and other exercises. The pain has worsened and radiates into her neck. She regularly takes ibuprofen and a natural supplement for pain relief, but these have not been effective. No recent falls or injuries to the shoulder. She describes the pain as 'constant' and 'sore,' with certain movements exacerbating the discomfort.  She would like to re-establish with GYN as she  has not been seen in a while- she notes her menses still come monthly but have been heavier the last year or so    Patient Active Problem List   Diagnosis Date Noted   Chronic bronchitis (HCC) 04/29/2021   Pseudomonas infection 04/29/2021   Chronic rhinitis 04/29/2021   Abnormal EKG 04/29/2021   Tobacco abuse 04/29/2021   MDD (major depressive disorder) 08/21/2019   Severe recurrent major depression without psychotic features (HCC) 08/21/2019   Suicide attempt (HCC) 08/21/2019   Osteonecrosis of hip (HCC) 06/13/2018   Tibial plateau fracture, right, closed, initial encounter 11/27/2017   Anorexia nervosa (HCC) 08/19/2014   Back pain 08/19/2014   Malnutrition, calorie 12/03/2013   Breast cancer of upper-outer quadrant of right female breast (HCC) 03/21/2013   Chronic pain 01/09/2013   DVT of upper extremity (deep vein thrombosis) (HCC) 01/02/2013   Arm edema 12/27/2012   Neuropathic pain 12/27/2012   Chronic female pelvic pain 08/21/2012   Fatigue 08/20/2012   Chemotherapy induced cardiomyopathy 09/08/2011   Generalized convulsive seizure (HCC) 07/13/2011   Seizure disorder (HCC) 07/13/2011   Migraine 07/13/2011   Anxiety disorder 07/13/2011   Depression 07/13/2011   Hx MRSA infection 04/13/2011    Past Medical History:  Diagnosis Date   Alcohol abuse    Anorexia nervosa (HCC)    Anxiety    Avascular necrosis of bone of hip (HCC)    Bipolar affective disorder, mixed (HCC)    Breast cancer (HCC)    Right   C. difficile diarrhea    hx  of   Chronic pain    Depression    Dilated cardiomyopathy secondary to drug    DJD (degenerative joint disease)    BONE STICKS OUT ON LEFT PART OF LOWER BACK   DVT of upper extremity (deep vein thrombosis) (HCC) 2014   Edema of upper extremity    Family history of anesthesia complication    MOTHER PONV   GERD (gastroesophageal reflux disease)    OCCASIONALLY TAKE ZANTAC   History of breast cancer DX JUNE 2012 W/ RIGHT BREAST  INVASIVE DUCTAL CA  STAGE IIB---  S/P BILATERAL MASTECTOMIES AND RIGHT NODE DISSECTION   ONCOLOGIST-- DR LAYLA--  CHEMO ENDED DEC 2012 / RADIATION ENDED MAR 2013--  CURRENT ON TAMOXIFEN  AND ZOLADEX    History of idiopathic seizure    DX 2008 --  LAST ONE 2012--  NO ISSUES SINCE AND NO MEDS. -- PT STATES DOCTORS FELT IT WAS STRESS RELATED DUE TO HEALTH ISSUES   Hot flashes due to tamoxifen     Hx MRSA infection 03/28/2011   Skin   Left ovarian cyst    Migraines    Neuropathy    Pneumonia    Seizures (HCC)    2024 last seizure- patient reports it was all related to my extreme stress level; takes Xanax  for this condition   Status post chemotherapy    docetaxel /carboplatin /trastuzumab     Past Surgical History:  Procedure Laterality Date   BIOPSY BREAST  10/14/2010   right needle core biopsy   BREAST SURGERY  11/16/2010   bilateral mastectomy+ right axillary node dissection,T1cN1a, Her2+,ERPR+  (right breast invasive ductal carcinoma)   CYSTO WITH HYDRODISTENSION N/A 07/23/2012   Procedure: CYSTOSCOPY/HYDRODISTENSION instillation of marcaine  and pyridium ;  Surgeon: Glendia DELENA Elizabeth, MD;  Location: [facility];  Service: Urology;  Laterality: N/A;  INSTILLATION     HARDWARE REMOVAL Right 12/03/2018   Procedure: Right tibia screw removal;  Surgeon: Melodi Lerner, MD;  Location: [facility];  Service: Orthopedics;  Laterality: Right;    I & D EXTREMITY  09/26/2011   Procedure: IRRIGATION AND DEBRIDEMENT EXTREMITY;  Surgeon: Franky JONELLE Curia, MD;  Location: [facility];  Service: Orthopedics;  Laterality: Right;  I&D right hand cat bite wound   ORIF TIBIA PLATEAU Right 11/27/2017   Procedure: OPEN REDUCTION INTERNAL FIXATION (ORIF) TIBIAL PLATEAU FRACTURE;  Surgeon: Melodi Lerner, MD;  Location: [facility];  Service: Orthopedics;  Laterality: Right;   PORT-A-CATH REMOVAL     PORTACATH PLACEMENT  11/16/2010   placement of left subclavian port NOW REMOVED   right knee torn  meniscus surgery      TOTAL HIP ARTHROPLASTY Left 06/13/2018   Procedure: TOTAL HIP ARTHROPLASTY ANTERIOR APPROACH;  Surgeon: Melodi Lerner, MD;  Location: [facility];  Service: Orthopedics;  Laterality: Left;    TOTAL HIP ARTHROPLASTY Right 02/02/2022   Procedure: TOTAL HIP ARTHROPLASTY ANTERIOR APPROACH;  Surgeon: Melodi Lerner, MD;  Location: [facility];  Service: Orthopedics;  Laterality: Right;   TRANSTHORACIC ECHOCARDIOGRAM  05/28/2012   LVF NORMAL/  EF 55-60%   WISDOM TOOTH EXTRACTION      Social History   Tobacco Use   Smoking status: Every Day    Current packs/day: 1.50    Average packs/day: 1.5 packs/day for 20.0 years (30.0 ttl pk-yrs)    Types: Cigarettes   Smokeless tobacco: Never  Vaping Use   Vaping status: Never Used  Substance Use Topics   Alcohol use: Yes    Alcohol/week: 4.0 standard drinks of alcohol  Types: 4 Glasses of wine per week    Comment: daily   Drug use: No    Comment: LAST USED COCAINE AND MARIJIUANA 2011    Family History  Problem Relation Age of Onset   Colon polyps Mother 51   Mental illness Brother    Cancer Maternal Grandfather    Mental illness Son    Stomach cancer Neg Hx    Esophageal cancer Neg Hx    Colon cancer Neg Hx    Rectal cancer Neg Hx     Allergies  Allergen Reactions   Thorazine [Chlorpromazine Hcl] Hives   Tramadol  Other (See Comments)    Seizures   Phenytoin  Nausea And Vomiting   Xarelto  [Rivaroxaban ]     Medication list has been reviewed and updated.  Current Outpatient Medications on File Prior to Visit  Medication Sig Dispense Refill   albuterol  (VENTOLIN  HFA) 108 (90 Base) MCG/ACT inhaler INHALE 2 PUFFS INTO THE LUNGS EVERY 4 HOURS AS NEEDED FOR WHEEZING OR SHORTNESS OF BREATH. (Patient taking differently: Inhale 2 puffs into the lungs as needed for wheezing or shortness of breath.) 18 each 2   ALPRAZolam  (XANAX ) 1 MG tablet Take 1 mg by mouth 4 (four) times daily. (Patient not taking: Reported on  02/26/2024)     dicyclomine  (BENTYL ) 20 MG tablet Take 1 tablet (20 mg total) by mouth 2 (two) times daily. (Patient not taking: Reported on 02/26/2024) 20 tablet 0   Eszopiclone 3 MG TABS Take 3 mg by mouth at bedtime. Take immediately before bedtime (Patient not taking: Reported on 02/26/2024)     gabapentin  (NEURONTIN ) 600 MG tablet Take 600 mg by mouth 2 (two) times daily. 3 tabs in AM, 2 tabs in PM (Patient not taking: Reported on 02/26/2024)     hydrOXYzine  (ATARAX ) 25 MG tablet Take 1 tablet (25 mg total) by mouth every 6 (six) hours. (Patient not taking: Reported on 02/26/2024) 12 tablet 0   loratadine  (CLARITIN ) 10 MG tablet Take 10 mg by mouth daily. (Patient not taking: Reported on 02/26/2024)     methocarbamol  (ROBAXIN ) 500 MG tablet Take 1 tablet (500 mg total) by mouth 2 (two) times daily. (Patient not taking: Reported on 02/26/2024) 10 tablet 0   ondansetron  (ZOFRAN ) 4 MG tablet Take 1 tablet (4 mg total) by mouth every 8 (eight) hours as needed for nausea or vomiting. (Patient not taking: Reported on 02/26/2024) 12 tablet 0   ondansetron  (ZOFRAN -ODT) 4 MG disintegrating tablet Take 1 tablet (4 mg total) by mouth every 8 (eight) hours as needed for nausea. (Patient not taking: Reported on 02/26/2024) 10 tablet 0   pantoprazole  (PROTONIX ) 20 MG tablet Take 1 tablet (20 mg total) by mouth daily. (Patient not taking: Reported on 02/26/2024) 30 tablet 0   promethazine  (PHENERGAN ) 25 MG tablet Take 1 tablet (25 mg total) by mouth every 6 (six) hours as needed for up to 5 doses for nausea or vomiting. (Patient not taking: Reported on 02/26/2024) 5 tablet 0   saccharomyces boulardii (FLORASTOR) 250 MG capsule Take 500 mg by mouth 2 (two) times daily. (Patient not taking: Reported on 02/26/2024)     sucralfate  (CARAFATE ) 1 g tablet Take 1 tablet (1 g total) by mouth 4 (four) times daily -  with meals and at bedtime. (Patient not taking: Reported on 02/26/2024) 47 tablet 0   No current  facility-administered medications on file prior to visit.    Review of Systems:  As per HPI- otherwise negative.   Physical  Examination: Vitals:   02/26/24 1302  BP: 138/82  Pulse: 98  SpO2: 97%   Vitals:   02/26/24 1302  Weight: 129 lb 12.8 oz (58.9 kg)  Height: 5' 4 (1.626 m)   Body mass index is 22.28 kg/m. Ideal Body Weight: Weight in (lb) to have BMI = 25: 145.3  GEN: no acute distress.  Slender build, looks well.  Status post bilateral mastectomy HEENT: Atraumatic, Normocephalic.  Ears and Nose: No external deformity. CV: RRR, No M/G/R. No JVD. No thrill. No extra heart sounds. PULM: CTA B, no wheezes, crackles, rhonchi. No retractions. No resp. distress. No accessory muscle use. ABD: S, NT, ND, +BS. No rebound. No HSM. EXTR: No c/c/e PSYCH: Normally interactive. Conversant.  Her right knee shows some old operative scars which are well-healed.  She notes some tenderness along the medial joint line.  No significant effusion is noted Left shoulder: She is significantly tender over the rotator cuff insertion.  Range of motion is normal though she has some discomfort with full flexion and abduction.  Strength is also normal  Assessment and Plan: Chronic pain of right knee - Plan: Ambulatory referral to Orthopedic Surgery, meloxicam  (MOBIC ) 15 MG tablet  Acute pain of left shoulder - Plan: meloxicam  (MOBIC ) 15 MG tablet, ketorolac  (TORADOL ) 30 MG/ML injection 30 mg  Menstrual irregularity - Plan: Ambulatory referral to Obstetrics / Gynecology  Assessment & Plan Left shoulder pain due to rotator cuff strain Acute left shoulder pain likely from rotator cuff strain post-exercise. Inflammation and pain noted, no tear evident-strength and range of motion are intact.  Ibuprofen ineffective. - Administer Toradol  injection for immediate pain relief. - Prescribe meloxicam  for ongoing anti-inflammatory treatment. - Advise ice application to reduce inflammation. - Instruct rest  and avoidance of exacerbating activities. - Encourage range of motion exercises to prevent stiffness.  Chronic right knee pain with history of meniscal surgery and fracture, likely osteoarthritis. Increased pain and hollowness noted, no recent trauma. - Refer to Dr. Melodi who did her previous knee surgery as well as hip replacements for further evaluation. - Consider x-ray if necessary by specialist.  Gynecologic symptoms (irregular, heavier periods and ovarian pain) Irregular, heavier periods with ovarian pain. Concerns about early menopause. - Refer to gynecologist for further evaluation.  Signed Harlene Schroeder, MD

## 2024-02-26 ENCOUNTER — Encounter: Payer: Self-pay | Admitting: Family Medicine

## 2024-02-26 ENCOUNTER — Ambulatory Visit: Admitting: Family Medicine

## 2024-02-26 VITALS — BP 138/82 | HR 98 | Ht 64.0 in | Wt 129.8 lb

## 2024-02-26 DIAGNOSIS — G8929 Other chronic pain: Secondary | ICD-10-CM | POA: Diagnosis not present

## 2024-02-26 DIAGNOSIS — N926 Irregular menstruation, unspecified: Secondary | ICD-10-CM | POA: Diagnosis not present

## 2024-02-26 DIAGNOSIS — M25561 Pain in right knee: Secondary | ICD-10-CM

## 2024-02-26 DIAGNOSIS — M25512 Pain in left shoulder: Secondary | ICD-10-CM | POA: Diagnosis not present

## 2024-02-26 MED ORDER — MELOXICAM 15 MG PO TABS: 15.0000 mg | ORAL_TABLET | Freq: Every day | ORAL | 1 refills | Status: DC

## 2024-02-26 MED ORDER — KETOROLAC TROMETHAMINE 30 MG/ML IJ SOLN
30.0000 mg | Freq: Once | INTRAMUSCULAR | Status: AC
  Administered 2024-02-26: 30 mg via INTRAMUSCULAR

## 2024-02-26 NOTE — Patient Instructions (Signed)
 Good to see you today -shot of toradol  for your shoulder.  Take it easy- let your shoulder heal Do range of motion to prevent stiffness Can take meloxicam  once daily as needed starting tomorrow Ice/ heat may help   Referral back to Dr Melodi for your knee  Referral to [facility] in [place] for GYN care

## 2024-02-28 NOTE — Progress Notes (Signed)
 Crystal [name]                                          MRN: [id number]   02/28/2024   The [facility] Quality Team Specialist reviewed this patient medical record for the purposes of chart review for care gap closure. The following were reviewed: chart review for care gap closure-colorectal cancer screening.    [facility] Quality Team

## 2024-03-05 ENCOUNTER — Telehealth: Payer: Self-pay

## 2024-03-05 NOTE — Telephone Encounter (Signed)
 LMOM for patient to return call. She is due for colonoscopy or cologuard.

## 2024-03-29 ENCOUNTER — Emergency Department (HOSPITAL_BASED_OUTPATIENT_CLINIC_OR_DEPARTMENT_OTHER): Admitting: Radiology

## 2024-03-29 ENCOUNTER — Emergency Department (HOSPITAL_BASED_OUTPATIENT_CLINIC_OR_DEPARTMENT_OTHER)

## 2024-03-29 ENCOUNTER — Emergency Department (HOSPITAL_BASED_OUTPATIENT_CLINIC_OR_DEPARTMENT_OTHER)
Admission: EM | Admit: 2024-03-29 | Discharge: 2024-03-29 | Disposition: A | Discharge status: Home / Self Care | Attending: Emergency Medicine | Admitting: Emergency Medicine

## 2024-03-29 ENCOUNTER — Other Ambulatory Visit: Payer: Self-pay

## 2024-03-29 ENCOUNTER — Encounter (HOSPITAL_BASED_OUTPATIENT_CLINIC_OR_DEPARTMENT_OTHER): Payer: Self-pay

## 2024-03-29 DIAGNOSIS — S0083XA Contusion of other part of head, initial encounter: Secondary | ICD-10-CM | POA: Diagnosis not present

## 2024-03-29 DIAGNOSIS — Z79899 Other long term (current) drug therapy: Secondary | ICD-10-CM | POA: Diagnosis not present

## 2024-03-29 DIAGNOSIS — F17200 Nicotine dependence, unspecified, uncomplicated: Secondary | ICD-10-CM | POA: Insufficient documentation

## 2024-03-29 DIAGNOSIS — W503XXA Accidental bite by another person, initial encounter: Secondary | ICD-10-CM

## 2024-03-29 DIAGNOSIS — S61012A Laceration without foreign body of left thumb without damage to nail, initial encounter: Secondary | ICD-10-CM | POA: Insufficient documentation

## 2024-03-29 DIAGNOSIS — Y906 Blood alcohol level of 120-199 mg/100 ml: Secondary | ICD-10-CM | POA: Insufficient documentation

## 2024-03-29 DIAGNOSIS — S0990XA Unspecified injury of head, initial encounter: Secondary | ICD-10-CM

## 2024-03-29 DIAGNOSIS — F10920 Alcohol use, unspecified with intoxication, uncomplicated: Secondary | ICD-10-CM | POA: Diagnosis not present

## 2024-03-29 DIAGNOSIS — S6992XA Unspecified injury of left wrist, hand and finger(s), initial encounter: Secondary | ICD-10-CM | POA: Diagnosis present

## 2024-03-29 LAB — CBC WITH DIFFERENTIAL/PLATELET
Abs Immature Granulocytes: UNDETERMINED K/uL (ref 0.00–0.07)
Abs Immature Granulocytes: 0.03 K/uL (ref 0.00–0.07)
Basophils Absolute: UNDETERMINED K/uL (ref 0.0–0.1)
Basophils Absolute: 0 K/uL (ref 0.0–0.1)
Basophils Relative: UNDETERMINED %
Basophils Relative: 1 %
Eosinophils Absolute: UNDETERMINED K/uL (ref 0.0–0.5)
Eosinophils Absolute: 0.2 K/uL (ref 0.0–0.5)
Eosinophils Relative: UNDETERMINED %
Eosinophils Relative: 3 %
HCT: UNDETERMINED % (ref 36.0–46.0)
HCT: 44.7 % (ref 36.0–46.0)
Hemoglobin: UNDETERMINED g/dL (ref 12.0–15.0)
Hemoglobin: 16 g/dL — ABNORMAL HIGH (ref 12.0–15.0)
Immature Granulocytes: UNDETERMINED %
Immature Granulocytes: 0 %
Lymphocytes Relative: UNDETERMINED %
Lymphocytes Relative: 22 %
Lymphs Abs: UNDETERMINED K/uL (ref 0.7–4.0)
Lymphs Abs: 1.8 K/uL (ref 0.7–4.0)
MCH: UNDETERMINED pg (ref 26.0–34.0)
MCH: 33.3 pg (ref 26.0–34.0)
MCHC: UNDETERMINED g/dL (ref 30.0–36.0)
MCHC: 35.8 g/dL (ref 30.0–36.0)
MCV: UNDETERMINED fL (ref 80.0–100.0)
MCV: 92.9 fL (ref 80.0–100.0)
Monocytes Absolute: UNDETERMINED K/uL (ref 0.1–1.0)
Monocytes Absolute: 0.3 K/uL (ref 0.1–1.0)
Monocytes Relative: UNDETERMINED %
Monocytes Relative: 4 %
Neutro Abs: UNDETERMINED K/uL (ref 1.7–7.7)
Neutro Abs: 5.7 K/uL (ref 1.7–7.7)
Neutrophils Relative %: UNDETERMINED %
Neutrophils Relative %: 70 %
Platelets: UNDETERMINED K/uL (ref 150–400)
Platelets: 216 K/uL (ref 150–400)
RBC: UNDETERMINED MIL/uL (ref 3.87–5.11)
RBC: 4.81 MIL/uL (ref 3.87–5.11)
RDW: UNDETERMINED % (ref 11.5–15.5)
RDW: 12.1 % (ref 11.5–15.5)
WBC: UNDETERMINED K/uL (ref 4.0–10.5)
WBC: 8.2 K/uL (ref 4.0–10.5)
nRBC: UNDETERMINED % (ref 0.0–0.2)
nRBC: 0 % (ref 0.0–0.2)

## 2024-03-29 LAB — URINE DRUG SCREEN
Amphetamines: NEGATIVE
Barbiturates: NEGATIVE
Benzodiazepines: NEGATIVE
Cocaine: NEGATIVE
Fentanyl: NEGATIVE
Methadone Scn, Ur: NEGATIVE
Opiates: NEGATIVE
Tetrahydrocannabinol: NEGATIVE

## 2024-03-29 LAB — COMPREHENSIVE METABOLIC PANEL WITH GFR
ALT: 18 U/L (ref 0–44)
AST: 27 U/L (ref 15–41)
Albumin: 4.8 g/dL (ref 3.5–5.0)
Alkaline Phosphatase: 75 U/L (ref 38–126)
Anion gap: 15 (ref 5–15)
BUN: 12 mg/dL (ref 6–20)
CO2: 21 mmol/L — ABNORMAL LOW (ref 22–32)
Calcium: 10.1 mg/dL (ref 8.9–10.3)
Chloride: 107 mmol/L (ref 98–111)
Creatinine, Ser: 0.9 mg/dL (ref 0.44–1.00)
GFR, Estimated: >60 mL/min (ref >60)
Glucose, Bld: 97 mg/dL (ref 70–99)
Potassium: 4.3 mmol/L (ref 3.5–5.1)
Sodium: 143 mmol/L (ref 135–145)
Total Bilirubin: 0.2 mg/dL (ref 0.0–1.2)
Total Protein: 7.3 g/dL (ref 6.5–8.1)

## 2024-03-29 LAB — ETHANOL: Alcohol, Ethyl (B): 144 mg/dL — ABNORMAL HIGH (ref <15)

## 2024-03-29 LAB — HCG, SERUM, QUALITATIVE: Preg, Serum: NEGATIVE

## 2024-03-29 MED ORDER — LORAZEPAM 1 MG PO TABS
1.0000 mg | ORAL_TABLET | Freq: Once | ORAL | Status: AC
  Administered 2024-03-29: 1 mg via ORAL
  Filled 2024-03-29: qty 1

## 2024-03-29 MED ORDER — IBUPROFEN 400 MG PO TABS
600.0000 mg | ORAL_TABLET | Freq: Once | ORAL | Status: AC
  Administered 2024-03-29: 600 mg via ORAL
  Filled 2024-03-29: qty 1

## 2024-03-29 MED ORDER — AMOXICILLIN-POT CLAVULANATE 875-125 MG PO TABS: 1.0000 | ORAL_TABLET | Freq: Two times a day (BID) | ORAL | 0 refills | Status: DC
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
## 2024-04-01 ENCOUNTER — Telehealth: Payer: Self-pay | Admitting: Family Medicine

## 2024-04-01 NOTE — Telephone Encounter (Signed)
 Copied from CRM #[id number]. Topic: Medicare AWV >> Apr 01, 2024 10:44 AM Crystal [name] wrote: Called LVM 04/01/2024 to sched AWV. Please schedule in office or virtual visit.   Crystal Paschal; Care Guide Ambulatory Clinical Support [place] [facility] Direct Dial: [phone]

## 2024-05-22 ENCOUNTER — Other Ambulatory Visit: Payer: Self-pay

## 2024-05-22 ENCOUNTER — Encounter (HOSPITAL_COMMUNITY): Payer: Self-pay | Admitting: Emergency Medicine

## 2024-05-22 ENCOUNTER — Emergency Department (HOSPITAL_COMMUNITY)

## 2024-05-22 ENCOUNTER — Inpatient Hospital Stay (HOSPITAL_COMMUNITY)
Admission: EM | Admit: 2024-05-22 | Discharge: 2024-05-24 | DRG: 200 | Disposition: A | Discharge status: Home / Self Care | Attending: General Surgery | Admitting: General Surgery

## 2024-05-22 DIAGNOSIS — Z79810 Long term (current) use of selective estrogen receptor modulators (SERMs): Secondary | ICD-10-CM

## 2024-05-22 DIAGNOSIS — J939 Pneumothorax, unspecified: Secondary | ICD-10-CM | POA: Diagnosis present

## 2024-05-22 DIAGNOSIS — Z9221 Personal history of antineoplastic chemotherapy: Secondary | ICD-10-CM

## 2024-05-22 DIAGNOSIS — S50311A Abrasion of right elbow, initial encounter: Secondary | ICD-10-CM | POA: Diagnosis present

## 2024-05-22 DIAGNOSIS — S50312A Abrasion of left elbow, initial encounter: Secondary | ICD-10-CM | POA: Diagnosis present

## 2024-05-22 DIAGNOSIS — I420 Dilated cardiomyopathy: Secondary | ICD-10-CM | POA: Diagnosis present

## 2024-05-22 DIAGNOSIS — F419 Anxiety disorder, unspecified: Secondary | ICD-10-CM | POA: Diagnosis present

## 2024-05-22 DIAGNOSIS — R739 Hyperglycemia, unspecified: Secondary | ICD-10-CM | POA: Diagnosis present

## 2024-05-22 DIAGNOSIS — F17210 Nicotine dependence, cigarettes, uncomplicated: Secondary | ICD-10-CM | POA: Diagnosis present

## 2024-05-22 DIAGNOSIS — G629 Polyneuropathy, unspecified: Secondary | ICD-10-CM | POA: Diagnosis present

## 2024-05-22 DIAGNOSIS — F319 Bipolar disorder, unspecified: Secondary | ICD-10-CM | POA: Diagnosis present

## 2024-05-22 DIAGNOSIS — Z96643 Presence of artificial hip joint, bilateral: Secondary | ICD-10-CM | POA: Diagnosis present

## 2024-05-22 DIAGNOSIS — S270XXA Traumatic pneumothorax, initial encounter: Principal | ICD-10-CM | POA: Diagnosis present

## 2024-05-22 DIAGNOSIS — Z91410 Personal history of adult physical and sexual abuse: Secondary | ICD-10-CM

## 2024-05-22 DIAGNOSIS — Z853 Personal history of malignant neoplasm of breast: Secondary | ICD-10-CM

## 2024-05-22 DIAGNOSIS — Z8614 Personal history of Methicillin resistant Staphylococcus aureus infection: Secondary | ICD-10-CM

## 2024-05-22 DIAGNOSIS — G43909 Migraine, unspecified, not intractable, without status migrainosus: Secondary | ICD-10-CM | POA: Diagnosis present

## 2024-05-22 DIAGNOSIS — Z608 Other problems related to social environment: Secondary | ICD-10-CM | POA: Diagnosis present

## 2024-05-22 DIAGNOSIS — Z79899 Other long term (current) drug therapy: Secondary | ICD-10-CM

## 2024-05-22 DIAGNOSIS — Z885 Allergy status to narcotic agent status: Secondary | ICD-10-CM

## 2024-05-22 DIAGNOSIS — Z1731 Human epidermal growth factor receptor 2 positive status: Secondary | ICD-10-CM

## 2024-05-22 DIAGNOSIS — Z9013 Acquired absence of bilateral breasts and nipples: Secondary | ICD-10-CM

## 2024-05-22 DIAGNOSIS — R569 Unspecified convulsions: Secondary | ICD-10-CM | POA: Diagnosis present

## 2024-05-22 DIAGNOSIS — Z888 Allergy status to other drugs, medicaments and biological substances status: Secondary | ICD-10-CM

## 2024-05-22 DIAGNOSIS — Z83719 Family history of colon polyps, unspecified: Secondary | ICD-10-CM

## 2024-05-22 DIAGNOSIS — Z8659 Personal history of other mental and behavioral disorders: Secondary | ICD-10-CM

## 2024-05-22 DIAGNOSIS — G8929 Other chronic pain: Secondary | ICD-10-CM | POA: Diagnosis present

## 2024-05-22 DIAGNOSIS — Z86718 Personal history of other venous thrombosis and embolism: Secondary | ICD-10-CM

## 2024-05-22 DIAGNOSIS — D72829 Elevated white blood cell count, unspecified: Secondary | ICD-10-CM | POA: Diagnosis present

## 2024-05-22 DIAGNOSIS — S0181XA Laceration without foreign body of other part of head, initial encounter: Secondary | ICD-10-CM | POA: Diagnosis present

## 2024-05-22 DIAGNOSIS — S2231XA Fracture of one rib, right side, initial encounter for closed fracture: Secondary | ICD-10-CM | POA: Diagnosis present

## 2024-05-22 DIAGNOSIS — Z923 Personal history of irradiation: Secondary | ICD-10-CM

## 2024-05-22 DIAGNOSIS — Z818 Family history of other mental and behavioral disorders: Secondary | ICD-10-CM

## 2024-05-22 DIAGNOSIS — Z791 Long term (current) use of non-steroidal anti-inflammatories (NSAID): Secondary | ICD-10-CM

## 2024-05-22 DIAGNOSIS — Z809 Family history of malignant neoplasm, unspecified: Secondary | ICD-10-CM

## 2024-05-22 DIAGNOSIS — Z8701 Personal history of pneumonia (recurrent): Secondary | ICD-10-CM

## 2024-05-22 MED ORDER — HYDROMORPHONE HCL 1 MG/ML IJ SOLN
0.5000 mg | Freq: Once | INTRAMUSCULAR | Status: AC
  Administered 2024-05-22: 0.5 mg via INTRAVENOUS
  Filled 2024-05-22: qty 1

## 2024-05-22 MED ORDER — OXYCODONE-ACETAMINOPHEN 5-325 MG PO TABS
1.0000 | ORAL_TABLET | Freq: Once | ORAL | Status: AC
  Administered 2024-05-22: 1 via ORAL
  Filled 2024-05-22: qty 1
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
## 2024-05-23 ENCOUNTER — Inpatient Hospital Stay (HOSPITAL_COMMUNITY)

## 2024-05-23 ENCOUNTER — Emergency Department (HOSPITAL_COMMUNITY)

## 2024-05-23 DIAGNOSIS — F319 Bipolar disorder, unspecified: Secondary | ICD-10-CM | POA: Diagnosis present

## 2024-05-23 DIAGNOSIS — D72829 Elevated white blood cell count, unspecified: Secondary | ICD-10-CM | POA: Diagnosis present

## 2024-05-23 DIAGNOSIS — Z818 Family history of other mental and behavioral disorders: Secondary | ICD-10-CM | POA: Diagnosis not present

## 2024-05-23 DIAGNOSIS — S0181XA Laceration without foreign body of other part of head, initial encounter: Secondary | ICD-10-CM | POA: Diagnosis present

## 2024-05-23 DIAGNOSIS — J939 Pneumothorax, unspecified: Secondary | ICD-10-CM | POA: Diagnosis present

## 2024-05-23 DIAGNOSIS — F17210 Nicotine dependence, cigarettes, uncomplicated: Secondary | ICD-10-CM | POA: Diagnosis present

## 2024-05-23 DIAGNOSIS — R569 Unspecified convulsions: Secondary | ICD-10-CM | POA: Diagnosis present

## 2024-05-23 DIAGNOSIS — Z885 Allergy status to narcotic agent status: Secondary | ICD-10-CM | POA: Diagnosis not present

## 2024-05-23 DIAGNOSIS — G8929 Other chronic pain: Secondary | ICD-10-CM | POA: Diagnosis present

## 2024-05-23 DIAGNOSIS — F419 Anxiety disorder, unspecified: Secondary | ICD-10-CM | POA: Diagnosis present

## 2024-05-23 DIAGNOSIS — Z79899 Other long term (current) drug therapy: Secondary | ICD-10-CM | POA: Diagnosis not present

## 2024-05-23 DIAGNOSIS — Z59869 Financial insecurity, unspecified: Secondary | ICD-10-CM

## 2024-05-23 DIAGNOSIS — Z853 Personal history of malignant neoplasm of breast: Secondary | ICD-10-CM | POA: Diagnosis not present

## 2024-05-23 DIAGNOSIS — Z9013 Acquired absence of bilateral breasts and nipples: Secondary | ICD-10-CM | POA: Diagnosis not present

## 2024-05-23 DIAGNOSIS — Z86718 Personal history of other venous thrombosis and embolism: Secondary | ICD-10-CM | POA: Diagnosis not present

## 2024-05-23 DIAGNOSIS — Z888 Allergy status to other drugs, medicaments and biological substances status: Secondary | ICD-10-CM | POA: Diagnosis not present

## 2024-05-23 DIAGNOSIS — I420 Dilated cardiomyopathy: Secondary | ICD-10-CM | POA: Diagnosis present

## 2024-05-23 DIAGNOSIS — F1090 Alcohol use, unspecified, uncomplicated: Secondary | ICD-10-CM

## 2024-05-23 DIAGNOSIS — S270XXA Traumatic pneumothorax, initial encounter: Secondary | ICD-10-CM | POA: Diagnosis present

## 2024-05-23 DIAGNOSIS — Z791 Long term (current) use of non-steroidal anti-inflammatories (NSAID): Secondary | ICD-10-CM | POA: Diagnosis not present

## 2024-05-23 DIAGNOSIS — Z83719 Family history of colon polyps, unspecified: Secondary | ICD-10-CM | POA: Diagnosis not present

## 2024-05-23 DIAGNOSIS — S2231XA Fracture of one rib, right side, initial encounter for closed fracture: Secondary | ICD-10-CM | POA: Diagnosis present

## 2024-05-23 DIAGNOSIS — Z809 Family history of malignant neoplasm, unspecified: Secondary | ICD-10-CM | POA: Diagnosis not present

## 2024-05-23 DIAGNOSIS — T7411XA Adult physical abuse, confirmed, initial encounter: Secondary | ICD-10-CM

## 2024-05-23 DIAGNOSIS — G43909 Migraine, unspecified, not intractable, without status migrainosus: Secondary | ICD-10-CM | POA: Diagnosis present

## 2024-05-23 DIAGNOSIS — Z9221 Personal history of antineoplastic chemotherapy: Secondary | ICD-10-CM | POA: Diagnosis not present

## 2024-05-23 DIAGNOSIS — R739 Hyperglycemia, unspecified: Secondary | ICD-10-CM | POA: Diagnosis present

## 2024-05-23 DIAGNOSIS — Z96643 Presence of artificial hip joint, bilateral: Secondary | ICD-10-CM | POA: Diagnosis present

## 2024-05-23 LAB — COMPREHENSIVE METABOLIC PANEL WITH GFR
ALT: 34 U/L (ref 0–44)
AST: 38 U/L (ref 15–41)
Albumin: 5.1 g/dL — ABNORMAL HIGH (ref 3.5–5.0)
Alkaline Phosphatase: 72 U/L (ref 38–126)
Anion gap: 14 (ref 5–15)
BUN: 10 mg/dL (ref 6–20)
CO2: 23 mmol/L (ref 22–32)
Calcium: 9.7 mg/dL (ref 8.9–10.3)
Chloride: 109 mmol/L (ref 98–111)
Creatinine, Ser: 0.88 mg/dL (ref 0.44–1.00)
GFR, Estimated: >60 mL/min
Glucose, Bld: 115 mg/dL — ABNORMAL HIGH (ref 70–99)
Potassium: 4 mmol/L (ref 3.5–5.1)
Sodium: 145 mmol/L (ref 135–145)
Total Bilirubin: 0.3 mg/dL (ref 0.0–1.2)
Total Protein: 8 g/dL (ref 6.5–8.1)

## 2024-05-23 LAB — CBC WITH DIFFERENTIAL/PLATELET
Abs Immature Granulocytes: 0.04 K/uL (ref 0.00–0.07)
Basophils Absolute: 0 K/uL (ref 0.0–0.1)
Basophils Relative: 0 %
Eosinophils Absolute: 0 K/uL (ref 0.0–0.5)
Eosinophils Relative: 0 %
HCT: 46.9 % — ABNORMAL HIGH (ref 36.0–46.0)
Hemoglobin: 16.1 g/dL — ABNORMAL HIGH (ref 12.0–15.0)
Immature Granulocytes: 0 %
Lymphocytes Relative: 6 %
Lymphs Abs: 1 K/uL (ref 0.7–4.0)
MCH: 33 pg (ref 26.0–34.0)
MCHC: 34.3 g/dL (ref 30.0–36.0)
MCV: 96.1 fL (ref 80.0–100.0)
Monocytes Absolute: 0.9 K/uL (ref 0.1–1.0)
Monocytes Relative: 6 %
Neutro Abs: 14.2 K/uL — ABNORMAL HIGH (ref 1.7–7.7)
Neutrophils Relative %: 88 %
Platelets: 259 K/uL (ref 150–400)
RBC: 4.88 MIL/uL (ref 3.87–5.11)
RDW: 12.1 % (ref 11.5–15.5)
WBC: 16.2 K/uL — ABNORMAL HIGH (ref 4.0–10.5)
nRBC: 0 % (ref 0.0–0.2)

## 2024-05-23 LAB — HCG, SERUM, QUALITATIVE: Preg, Serum: NEGATIVE

## 2024-05-23 LAB — ETHANOL: Alcohol, Ethyl (B): 206 mg/dL — ABNORMAL HIGH

## 2024-05-23 LAB — I-STAT CG4 LACTIC ACID, ED: Lactic Acid, Venous: 1.6 mmol/L (ref 0.5–1.9)

## 2024-05-23 MED ORDER — OXYCODONE HCL 5 MG PO TABS
5.0000 mg | ORAL_TABLET | ORAL | Status: DC | PRN
  Administered 2024-05-23 – 2024-05-24 (×2): 10 mg via ORAL
  Filled 2024-05-23 (×2): qty 2

## 2024-05-23 MED ORDER — HYDROMORPHONE HCL 1 MG/ML IJ SOLN
0.5000 mg | Freq: Once | INTRAMUSCULAR | Status: AC
  Administered 2024-05-23: 0.5 mg via INTRAVENOUS
  Filled 2024-05-23: qty 1

## 2024-05-23 MED ORDER — LORAZEPAM 2 MG/ML IJ SOLN
1.0000 mg | INTRAMUSCULAR | Status: DC | PRN
  Administered 2024-05-23: 2 mg via INTRAVENOUS
  Filled 2024-05-23: qty 1

## 2024-05-23 MED ORDER — ACETAMINOPHEN 500 MG PO TABS
1000.0000 mg | ORAL_TABLET | Freq: Four times a day (QID) | ORAL | Status: DC
  Administered 2024-05-23 – 2024-05-24 (×5): 1000 mg via ORAL
  Filled 2024-05-23 (×5): qty 2

## 2024-05-23 MED ORDER — THIAMINE HCL 100 MG/ML IJ SOLN: 100.0000 mg | Freq: Every day | INTRAMUSCULAR | Status: DC

## 2024-05-23 MED ORDER — POLYETHYLENE GLYCOL 3350 17 G PO PACK: 17.0000 g | PACK | Freq: Every day | ORAL | Status: DC | PRN

## 2024-05-23 MED ORDER — ENOXAPARIN SODIUM 30 MG/0.3ML IJ SOSY
30.0000 mg | PREFILLED_SYRINGE | Freq: Two times a day (BID) | INTRAMUSCULAR | Status: DC
  Administered 2024-05-24: 30 mg via SUBCUTANEOUS
  Filled 2024-05-23: qty 0.3

## 2024-05-23 MED ORDER — ENOXAPARIN SODIUM 30 MG/0.3ML IJ SOSY: 30.0000 mg | PREFILLED_SYRINGE | Freq: Two times a day (BID) | INTRAMUSCULAR | Status: DC

## 2024-05-23 MED ORDER — THIAMINE MONONITRATE 100 MG PO TABS
100.0000 mg | ORAL_TABLET | Freq: Every day | ORAL | Status: DC
  Administered 2024-05-23 – 2024-05-24 (×2): 100 mg via ORAL
  Filled 2024-05-23 (×2): qty 1

## 2024-05-23 MED ORDER — METOPROLOL TARTRATE 5 MG/5ML IV SOLN: 5.0000 mg | Freq: Four times a day (QID) | INTRAVENOUS | Status: DC | PRN

## 2024-05-23 MED ORDER — MELATONIN 3 MG PO TABS: 3.0000 mg | ORAL_TABLET | Freq: Every evening | ORAL | Status: DC | PRN

## 2024-05-23 MED ORDER — FOLIC ACID 1 MG PO TABS
1.0000 mg | ORAL_TABLET | Freq: Every day | ORAL | Status: DC
  Administered 2024-05-23 – 2024-05-24 (×2): 1 mg via ORAL
  Filled 2024-05-23 (×2): qty 1

## 2024-05-23 MED ORDER — MORPHINE SULFATE (PF) 2 MG/ML IV SOLN
2.0000 mg | INTRAVENOUS | Status: DC | PRN
  Administered 2024-05-23 – 2024-05-24 (×4): 2 mg via INTRAVENOUS
  Filled 2024-05-23 (×4): qty 1

## 2024-05-23 MED ORDER — SENNA 8.6 MG PO TABS
1.0000 | ORAL_TABLET | Freq: Two times a day (BID) | ORAL | Status: DC
  Administered 2024-05-23 – 2024-05-24 (×3): 8.6 mg via ORAL
  Filled 2024-05-23 (×3): qty 1

## 2024-05-23 MED ORDER — MORPHINE SULFATE (PF) 4 MG/ML IV SOLN
4.0000 mg | Freq: Once | INTRAVENOUS | Status: AC
  Administered 2024-05-23: 4 mg via INTRAVENOUS
  Filled 2024-05-23: qty 1

## 2024-05-23 MED ORDER — METHOCARBAMOL 1000 MG/10ML IJ SOLN
500.0000 mg | Freq: Three times a day (TID) | INTRAMUSCULAR | Status: DC
  Administered 2024-05-23: 500 mg via INTRAVENOUS
  Filled 2024-05-23: qty 10

## 2024-05-23 MED ORDER — ONDANSETRON HCL 4 MG/2ML IJ SOLN
4.0000 mg | Freq: Once | INTRAMUSCULAR | Status: AC
  Administered 2024-05-23: 4 mg via INTRAVENOUS
  Filled 2024-05-23: qty 2

## 2024-05-23 MED ORDER — HYDRALAZINE HCL 20 MG/ML IJ SOLN: 10.0000 mg | INTRAMUSCULAR | Status: DC | PRN

## 2024-05-23 MED ORDER — ONDANSETRON HCL 4 MG/2ML IJ SOLN
4.0000 mg | Freq: Four times a day (QID) | INTRAMUSCULAR | Status: DC | PRN
  Administered 2024-05-23 (×2): 4 mg via INTRAVENOUS
  Filled 2024-05-23 (×2): qty 2

## 2024-05-23 MED ORDER — ADULT MULTIVITAMIN W/MINERALS CH
1.0000 | ORAL_TABLET | Freq: Every day | ORAL | Status: DC
  Administered 2024-05-23 – 2024-05-24 (×2): 1 via ORAL
  Filled 2024-05-23 (×2): qty 1

## 2024-05-23 MED ORDER — SPIRITUS FRUMENTI
1.0000 | Freq: Two times a day (BID) | ORAL | Status: DC
  Filled 2024-05-23 (×5): qty 1

## 2024-05-23 MED ORDER — ONDANSETRON 4 MG PO TBDP
4.0000 mg | ORAL_TABLET | Freq: Four times a day (QID) | ORAL | Status: DC | PRN
  Filled 2024-05-23: qty 1

## 2024-05-23 MED ORDER — METHOCARBAMOL 500 MG PO TABS
500.0000 mg | ORAL_TABLET | Freq: Three times a day (TID) | ORAL | Status: DC
  Administered 2024-05-23 – 2024-05-24 (×3): 500 mg via ORAL
  Filled 2024-05-23 (×3): qty 1

## 2024-05-23 MED ORDER — IOHEXOL 300 MG/ML  SOLN
100.0000 mL | Freq: Once | INTRAMUSCULAR | Status: AC | PRN
  Administered 2024-05-23: 100 mL via INTRAVENOUS

## 2024-05-23 MED ORDER — LORAZEPAM 1 MG PO TABS
1.0000 mg | ORAL_TABLET | ORAL | Status: DC | PRN
  Administered 2024-05-23: 1 mg via ORAL
  Administered 2024-05-24: 2 mg via ORAL
  Filled 2024-05-23: qty 1
  Filled 2024-05-23: qty 2
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
## 2024-05-23 NOTE — ED Notes (Signed)
 [facility]. [facility]

## 2024-05-23 NOTE — ED Provider Notes (Signed)
 49 yo female, arrives as transfer from [facility] ER for general surgery to see for pneumothorax.  States she has bruising and pain in her left 5th toe which she hadn't noticed previously. Pain in right lower chest with breathing.  Physical Exam  BP 117/89 (BP Location: Left Arm)   Pulse 100   Temp 98.2 F (36.8 C) (Oral)   Resp 16   SpO2 98%   Physical Exam  Procedures  Procedures  ED Course / MDM    Medical Decision Making Amount and/or Complexity of Data Reviewed Labs: ordered. Radiology: ordered.  Risk Prescription drug management.   XR left foot unremarkable. Call to Dr. Teresa with general surger on patient arrival to the ER.  Dr. Teresa to see patient.      Beverley Leita LABOR, PA-C 05/23/24 [id number]    Haze Lonni PARAS, MD 05/23/24 [phone]

## 2024-05-23 NOTE — ED Notes (Signed)
 RN unsuccessful attempt at blood draw x2.

## 2024-05-23 NOTE — ED Provider Notes (Signed)
 Patient was found to have a traumatic pneumothorax. It is around 10-15% This was discussed with patient.  She is not hypoxic I spoke with Dr. Ebbie with general surgery. At this time, patient does not require tube thoracostomy However he does recommend transfer to the [facility], ER.  He will contact trauma surgeon Dr. Teresa to see the patient there Patient is agreed to be admitted   Midge Golas, MD 05/23/24 [phone]
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
## 2024-05-23 NOTE — ED Notes (Signed)
Patient transported to XR at this time.

## 2024-05-23 NOTE — ED Notes (Signed)
Patient resting with eyes closed. Respirations even and unlabored.

## 2024-05-23 NOTE — ED Notes (Signed)
 CMD contacted to monitor pt.
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
## 2024-05-23 NOTE — ED Notes (Signed)
 Patient transported to 6 N at this time.  RN called and report given prior to transport to floor
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
## 2024-05-24 ENCOUNTER — Other Ambulatory Visit (HOSPITAL_COMMUNITY): Payer: Self-pay

## 2024-05-24 ENCOUNTER — Inpatient Hospital Stay (HOSPITAL_COMMUNITY)

## 2024-05-24 LAB — CBC
HCT: 42.3 % (ref 36.0–46.0)
Hemoglobin: 15.2 g/dL — ABNORMAL HIGH (ref 12.0–15.0)
MCH: 33.6 pg (ref 26.0–34.0)
MCHC: 35.9 g/dL (ref 30.0–36.0)
MCV: 93.4 fL (ref 80.0–100.0)
Platelets: 199 K/uL (ref 150–400)
RBC: 4.53 MIL/uL (ref 3.87–5.11)
RDW: 12.2 % (ref 11.5–15.5)
WBC: 7.2 K/uL (ref 4.0–10.5)
nRBC: 0 % (ref 0.0–0.2)

## 2024-05-24 LAB — BASIC METABOLIC PANEL WITH GFR
Anion gap: 10 (ref 5–15)
BUN: 14 mg/dL (ref 6–20)
CO2: 25 mmol/L (ref 22–32)
Calcium: 9.7 mg/dL (ref 8.9–10.3)
Chloride: 100 mmol/L (ref 98–111)
Creatinine, Ser: 0.92 mg/dL (ref 0.44–1.00)
GFR, Estimated: >60 mL/min
Glucose, Bld: 103 mg/dL — ABNORMAL HIGH (ref 70–99)
Potassium: 4.2 mmol/L (ref 3.5–5.1)
Sodium: 136 mmol/L (ref 135–145)

## 2024-05-24 LAB — HIV ANTIBODY (ROUTINE TESTING W REFLEX): HIV Screen 4th Generation wRfx: NONREACTIVE

## 2024-05-24 MED ORDER — ACETAMINOPHEN 500 MG PO TABS
1000.0000 mg | ORAL_TABLET | Freq: Four times a day (QID) | ORAL | 0 refills | Status: AC
  Filled 2024-05-24: qty 30, 4d supply, fill #0

## 2024-05-24 MED ORDER — POLYETHYLENE GLYCOL 3350 17 G PO PACK: 17.0000 g | PACK | Freq: Every day | ORAL | Status: AC

## 2024-05-24 MED ORDER — PHENOBARBITAL 32.4 MG PO TABS: 32.4000 mg | ORAL_TABLET | Freq: Three times a day (TID) | ORAL | Status: DC

## 2024-05-24 MED ORDER — DOCUSATE SODIUM 100 MG PO CAPS
100.0000 mg | ORAL_CAPSULE | Freq: Two times a day (BID) | ORAL | 0 refills | Status: AC
  Filled 2024-05-24: qty 10, 5d supply, fill #0

## 2024-05-24 MED ORDER — POLYETHYLENE GLYCOL 3350 17 G PO PACK
17.0000 g | PACK | Freq: Every day | ORAL | Status: DC
  Administered 2024-05-24: 17 g via ORAL
  Filled 2024-05-24: qty 1

## 2024-05-24 MED ORDER — DOCUSATE SODIUM 100 MG PO CAPS
100.0000 mg | ORAL_CAPSULE | Freq: Two times a day (BID) | ORAL | Status: DC
  Administered 2024-05-24: 100 mg via ORAL
  Filled 2024-05-24: qty 1

## 2024-05-24 MED ORDER — PANTOPRAZOLE SODIUM 40 MG PO TBEC
40.0000 mg | DELAYED_RELEASE_TABLET | Freq: Every day | ORAL | Status: DC
  Administered 2024-05-24: 40 mg via ORAL
  Filled 2024-05-24: qty 1

## 2024-05-24 MED ORDER — LORAZEPAM 2 MG/ML IJ SOLN: 1.0000 mg | INTRAMUSCULAR | Status: DC | PRN

## 2024-05-24 MED ORDER — OXYCODONE HCL 10 MG PO TABS
10.0000 mg | ORAL_TABLET | Freq: Four times a day (QID) | ORAL | 0 refills | Status: AC | PRN
  Filled 2024-05-24: qty 20, 5d supply, fill #0

## 2024-05-24 MED ORDER — PHENOBARBITAL 32.4 MG PO TABS
64.8000 mg | ORAL_TABLET | Freq: Three times a day (TID) | ORAL | Status: DC
  Administered 2024-05-24: 64.8 mg via ORAL
  Filled 2024-05-24: qty 2

## 2024-05-24 MED ORDER — MORPHINE SULFATE (PF) 2 MG/ML IV SOLN
2.0000 mg | Freq: Once | INTRAVENOUS | Status: DC
  Filled 2024-05-24: qty 1

## 2024-05-24 MED ORDER — IBUPROFEN 800 MG PO TABS
800.0000 mg | ORAL_TABLET | Freq: Three times a day (TID) | ORAL | Status: DC
  Administered 2024-05-24 (×2): 800 mg via ORAL
  Filled 2024-05-24 (×2): qty 1

## 2024-05-24 MED ORDER — METHOCARBAMOL 500 MG PO TABS
500.0000 mg | ORAL_TABLET | Freq: Three times a day (TID) | ORAL | 0 refills | Status: AC | PRN
  Filled 2024-05-24: qty 40, 7d supply, fill #0

## 2024-05-24 MED ORDER — OXYCODONE HCL 5 MG PO TABS
10.0000 mg | ORAL_TABLET | ORAL | Status: DC | PRN
  Filled 2024-05-24: qty 3

## 2024-05-24 MED ORDER — IBUPROFEN 600 MG PO TABS
600.0000 mg | ORAL_TABLET | Freq: Three times a day (TID) | ORAL | 0 refills | Status: AC
  Filled 2024-05-24: qty 21, 7d supply, fill #0

## 2024-05-24 MED ORDER — METHOCARBAMOL 500 MG PO TABS
1000.0000 mg | ORAL_TABLET | Freq: Three times a day (TID) | ORAL | Status: DC
  Administered 2024-05-24: 1000 mg via ORAL
  Filled 2024-05-24: qty 2

## 2024-05-24 MED ORDER — PANTOPRAZOLE SODIUM 40 MG PO TBEC
40.0000 mg | DELAYED_RELEASE_TABLET | Freq: Every day | ORAL | 0 refills | Status: AC
  Filled 2024-05-24: qty 30, 30d supply, fill #0

## 2024-05-24 NOTE — Progress Notes (Addendum)
 Discharge Nurse Summary: DC order noted per MD. DC RN at bedside with patient. Patient agreeable with discharge plan, states spouse will arrive soon for pickup. AVS printed/reviewed. PIV removed, skin intact. No DME needs. No home meds. TOC meds delivered to the patient. CP/Edu resolved. Telemonitor returned to charging station. All belongings accounted for. Patient supervision provided during independent walk downstairs for discharge by private auto.   Rosario EMERSON Lund, RN
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
## 2024-05-24 NOTE — Discharge Instructions (Signed)
PNEUMOTHORAX OR HEMOTHORAX +/- RIB FRACTURES  HOME INSTRUCTIONS   PAIN CONTROL:  Pain is best controlled by a usual combination of three different methods TOGETHER:  Ice/Heat Over the counter pain medication Prescription pain medication You may experience some swelling and bruising in area of broken ribs. Ice packs or heating pads (30-60 minutes up to 6 times a day) will help. Use ice for the first few days to help decrease swelling and bruising, then switch to heat to help relax tight/sore spots and speed recovery. Some people prefer to use ice alone, heat alone, alternating between ice & heat. Experiment to what works for you. Swelling and bruising can take several weeks to resolve.  It is helpful to take an over-the-counter pain medication regularly for the first few weeks. Choose one of the following that works best for you:  Naproxen (Aleve, etc) Two 220mg  tabs twice a day Ibuprofen (Advil, etc) Three 200mg  tabs four times a day (every meal & bedtime) Acetaminophen (Tylenol, etc) 500-650mg  four times a day (every meal & bedtime) A prescription for pain medication (such as oxycodone, hydrocodone, etc) may be given to you upon discharge. Take your pain medication as prescribed.  If you are having problems/concerns with the prescription medicine (does not control pain, nausea, vomiting, rash, itching, etc), please call us [phone] to see if we need to switch you to a different pain medicine that will work better for you and/or control your side effect better. If you need a refill on your pain medication, please contact your pharmacy. They will contact our office to request authorization. Prescriptions will not be filled after 5 pm or on week-ends. Avoid getting constipated. When taking pain medications, it is common to experience some constipation. Increasing fluid intake and taking a fiber supplement (such as Metamucil, Citrucel, FiberCon, MiraLax, etc) 1-2 times a day regularly will  usually help prevent this problem from occurring. A mild laxative (prune juice, Milk of Magnesia, MiraLax, etc) should be taken according to package directions if there are no bowel movements after 48 hours.  Watch out for diarrhea. If you have many loose bowel movements, simplify your diet to bland foods & liquids for a few days. Stop any stool softeners and decrease your fiber supplement. Switching to mild anti-diarrheal medications (Kayopectate, Pepto Bismol) can help. If this worsens or does not improve, please call us. Chest tube site wound: you may remove the dressing from your chest tube site 3 days after the removal of your chest tube. DO NOT shower over the dressing. Once   removed, you may shower as normal. Do not submerge your wound in water for 2-3 weeks.  FOLLOW UP  Please call our office to set up or confirm an appointment for follow up for 2 weeks after discharge. You will need to get a chest xray at either [facility] or [facility]. This will be outlined in your follow up instructions. Please call [facility] at [phone] if you have any questions about follow up.  If you have any orthopedic or other injuries you will need to follow up as outlined in your follow up instructions.   WHEN TO CALL us [phone]:  Poor pain control Reactions / problems with new medications (rash/itching, nausea, etc)  Fever over 101.5 F (38.5 C) Worsening swelling or bruising Redness, drainage, pain or swelling around chest tube site Worsening pain, productive cough, difficulty breathing or any other concerning symptoms  The clinic staff is available to answer your questions during  regular business hours (8:30am-5pm). Please don't hesitate to call and ask to speak to one of our nurses for clinical concerns.  If you have a medical emergency, go to the nearest emergency room or call 911.  A surgeon from [facility] is always on call at the [facility], [place], [place], [place],  [address] ?  MAIN: [phone] ? TOLL FREE: [phone] ?  FAX [phone]  [email or web address]      Information on Rib Fractures  A rib fracture is a break or crack in one of the bones of the ribs. The ribs are long, curved bones that wrap around your chest and attach to your spine and your breastbone. The ribs protect your heart, lungs, and other organs in the chest. A broken or cracked rib is often painful but is not usually serious. Most rib fractures heal on their own over time. However, rib fractures can be more serious if multiple ribs are broken or if broken ribs move out of place and push against other structures or organs. What are the causes? This condition is caused by: Repetitive movements with high force, such as pitching a baseball or having severe coughing spells. A direct blow to the chest, such as a sports injury, a car accident, or a fall. Cancer that has spread to the bones, which can weaken bones and cause them to break. What are the signs or symptoms? Symptoms of this condition include: Pain when you breathe in or cough. Pain when someone presses on the injured area. Feeling short of breath. How is this diagnosed? This condition is diagnosed with a physical exam and medical history. Imaging tests may also be done, such as: Chest X-ray. CT scan. MRI. Bone scan. Chest ultrasound. How is this treated? Treatment for this condition depends on the severity of the fracture. Most rib fractures usually heal on their own in 1-3 months. Sometimes healing takes longer if there is a cough that does not stop or if there are other activities that make the injury worse (aggravating factors). While you heal, you will be given medicines to control the pain. You will also be taught deep breathing exercises. Severe injuries may require hospitalization or surgery. Follow these instructions at home: Managing pain,  stiffness, and swelling If directed, apply ice to the injured area. Put ice in a plastic bag. Place a towel between your skin and the bag. Leave the ice on for 20 minutes, 2-3 times a day. Take over-the-counter and prescription medicines only as told by your health care provider. Activity Avoid a lot of activity and any activities or movements that cause pain. Be careful during activities and avoid bumping the injured rib. Slowly increase your activity as told by your health care provider. General instructions Do deep breathing exercises as told by your health care provider. This helps prevent pneumonia, which is a common complication of a broken rib. Your health care provider may instruct you to: Take deep breaths several times a day. Try to cough several times a day, holding a pillow against the injured area. Use a device called incentive spirometer to practice deep breathing several times a day. Drink enough fluid to keep your urine pale yellow. Do not wear a rib belt or binder. These restrict breathing, which can lead to pneumonia. Keep all follow-up visits as told by your health care provider. This is important. Contact a health care provider if: You have a fever. Get  help right away if: You have difficulty breathing or you are short of breath. You develop a cough that does not stop, or you cough up thick or bloody sputum. You have nausea, vomiting, or pain in your abdomen. Your pain gets worse and medicine does not help. Summary A rib fracture is a break or crack in one of the bones of the ribs. A broken or cracked rib is often painful but is not usually serious. Most rib fractures heal on their own over time. Treatment for this condition depends on the severity of the fracture. Avoid a lot of activity and any activities or movements that cause pain. This information is not intended to replace advice given to you by your health care provider. Make sure you discuss any questions  you have with your health care provider. Document Released: 04/18/2005 Document Revised: 07/18/2016 Document Reviewed: 07/18/2016 Elsevier Interactive Patient Education  2019 [employer]    Pneumothorax A pneumothorax is commonly called a collapsed lung. It is a condition in which air leaks from a lung and builds up between the thin layer of tissue that covers the lungs (visceral pleura) and the interior wall of the chest cavity (parietal pleura). The air gets trapped outside the lung, between the lung and the chest wall (pleural space). The air takes up space and prevents the lung from fully expanding. This condition sometimes occurs suddenly with no apparent cause. The buildup of air may be small or large. A small pneumothorax may go away on its own. A large pneumothorax will require treatment and hospitalization. What are the causes? This condition may be caused by: Trauma and injury to the chest wall. Surgery and other medical procedures. A complication of an underlying lung problem, especially chronic obstructive pulmonary disease (COPD) or emphysema. Sometimes the cause of this condition is not known. What increases the risk? You are more likely to develop this condition if: You have an underlying lung problem. You smoke. You are 85-36 years old, female, tall, and underweight. You have a personal or family history of pneumothorax. You have an eating disorder (anorexia nervosa). This condition can also happen quickly, even in people with no history of lung problems. What are the signs or symptoms? Sometimes a pneumothorax will have no symptoms. When symptoms are present, they can include: Chest pain. Shortness of breath. Increased rate of breathing. Bluish color to your lips or skin (cyanosis). How is this diagnosed? This condition may be diagnosed by: A medical history and physical exam. A chest X-ray, chest CT scan, or ultrasound. How is this treated? Treatment depends on  how severe your condition is. The goal of treatment is to remove the extra air and allow your lung to expand back to its normal size. For a small pneumothorax: No treatment may be needed. Extra oxygen is sometimes used to make it go away more quickly. For a large pneumothorax or a pneumothorax that is causing symptoms, a procedure is done to drain the air from your lungs. To do this, a health care provider may use: A needle with a syringe. This is used to suck air from a pleural space where no additional leakage is taking place. A chest tube. This is used to suck air where there is ongoing leakage into the pleural space. The chest tube may need to remain in place for several days until the air leak has healed. In more severe cases, surgery may be needed to repair the damage that is causing the leak. If you  have multiple pneumothorax episodes or have an air leak that will not heal, a procedure called a pleurodesis may be done. A medicine is placed in the pleural space to irritate the tissues around the lung so that the lung will stick to the chest wall, seal any leaks, and stop any buildup of air in that space. If you have an underlying lung problem, severe symptoms, or a large pneumothorax you will usually need to stay in the hospital. Follow these instructions at home: Lifestyle Do not use any products that contain nicotine or tobacco, such as cigarettes and e-cigarettes. These are major risk factors in pneumothorax. If you need help quitting, ask your health care provider. Do not lift anything that is heavier than 10 lb (4.5 kg), or the limit that your health care provider tells you, until he or she says that it is safe. Avoid activities that take a lot of effort (strenuous) for as long as told by your health care provider. Return to your normal activities as told by your health care provider. Ask your health care provider what activities are safe for you. Do not fly in an airplane or scuba dive  until your health care provider says it is okay. General instructions Take over-the-counter and prescription medicines only as told by your health care provider. If a cough or pain makes it difficult for you to sleep at night, try sleeping in a semi-upright position in a recliner or by using 2 or 3 pillows. If you had a chest tube and it was removed, ask your health care provider when you can remove the bandage (dressing). While the dressing is in place, do not allow it to get wet. Keep all follow-up visits as told by your health care provider. This is important. Contact a health care provider if: You cough up thick mucus (sputum) that is yellow or green in color. You were treated with a chest tube, and you have redness, increasing pain, or discharge at the site where it was placed. Get help right away if: You have increasing chest pain or shortness of breath. You have a cough that will not go away. You begin coughing up blood. You have pain that is getting worse or is not controlled with medicines. The site where your chest tube was located opens up. You feel air coming out of the site where the chest tube was placed. You have a fever or persistent symptoms for more than 2-3 days. You have a fever and your symptoms suddenly get worse. These symptoms may represent a serious problem that is an emergency. Do not wait to see if the symptoms will go away. Get medical help right away. Call your local emergency services (911 in the [place] Do not drive yourself to the hospital. Summary A pneumothorax, commonly called a collapsed lung, is a condition in which air leaks from a lung and gets trapped between the lung and the chest wall (pleural space). The buildup of air may be small or large. A small pneumothorax may go away on its own. A large pneumothorax will require treatment and hospitalization. Treatment for this condition depends on how severe the pneumothorax is. The goal of treatment is to  remove the extra air and allow the lung to expand back to its normal size. This information is not intended to replace advice given to you by your health care provider. Make sure you discuss any questions you have with your health care provider. Document Released: 04/18/2005 Document Revised: 03/27/2017 Document  Reviewed: 03/27/2017 Elsevier Interactive Patient Education  [employer].
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
## 2024-05-27 ENCOUNTER — Telehealth: Payer: Self-pay

## 2024-05-27 NOTE — Transitions of Care (Post Inpatient/ED Visit) (Signed)
" ° °  05/27/2024  Name: TIMBERLYNN KIZZIAH MRN: [id number] DOB: Aug 17, 1975  Today's TOC FU Call Status: Today's TOC FU Call Status:: Unsuccessful Call (1st Attempt) Unsuccessful Call (1st Attempt) Date: 05/27/24  Attempted to reach the patient regarding the most recent Inpatient/ED visit.  Follow Up Plan: Additional outreach attempts will be made to reach the patient to complete the Transitions of Care (Post Inpatient/ED visit) call.   Medford Balboa, BSN, RN [place]  [facility] - Population Health RN Care Manager [phone]  "

## 2024-05-28 ENCOUNTER — Telehealth: Payer: Self-pay

## 2024-05-28 NOTE — Transitions of Care (Post Inpatient/ED Visit) (Signed)
" ° °  05/28/2024  Name: Crystal [name] MRN: [id number] DOB: 06-13-1975  Today's TOC FU Call Status: Today's TOC FU Call Status:: Unsuccessful Call (2nd Attempt) Unsuccessful Call (1st Attempt) Date: 05/28/24 Unsuccessful Call (2nd Attempt) Date: 05/28/24  Attempted to reach the patient regarding the most recent Inpatient/ED visit.  Follow Up Plan: Additional outreach attempts will be made to reach the patient to complete the Transitions of Care (Post Inpatient/ED visit) call.   Arvin Seip RN, BSN, CCM [employer], Population Health Case Manager Phone: [phone]  "

## 2024-05-29 ENCOUNTER — Telehealth: Payer: Self-pay

## 2024-05-29 NOTE — Transitions of Care (Post Inpatient/ED Visit) (Signed)
" ° °  05/29/2024  Name: Crystal [name] MRN: [id number] DOB: Aug 02, 1975  Today's TOC FU Call Status: Today's TOC FU Call Status:: Unsuccessful Call (3rd Attempt) Unsuccessful Call (1st Attempt) Date: 05/27/24 Unsuccessful Call (2nd Attempt) Date: 05/28/24 Unsuccessful Call (3rd Attempt) Date: 05/29/24  Attempted to reach the patient regarding the most recent Inpatient/ED visit.  Follow Up Plan: No further outreach attempts will be made at this time. We have been unable to contact the patient.  Medford Balboa, BSN, RN [place]  [facility] - Population Health RN Care Manager [phone]  "
